# Patient Record
Sex: Female | Born: 1950 | Race: Black or African American | Hispanic: No | Marital: Married | Smoking: Current every day smoker
Health system: Southern US, Community
[De-identification: ages and names within clinical notes are randomized; demographics above are authoritative.]

## PROBLEM LIST (undated history)

## (undated) DIAGNOSIS — F329 Major depressive disorder, single episode, unspecified: Secondary | ICD-10-CM

## (undated) DIAGNOSIS — I10 Essential (primary) hypertension: Secondary | ICD-10-CM

## (undated) DIAGNOSIS — T7840XA Allergy, unspecified, initial encounter: Secondary | ICD-10-CM

## (undated) DIAGNOSIS — G8929 Other chronic pain: Secondary | ICD-10-CM

## (undated) DIAGNOSIS — Z923 Personal history of irradiation: Secondary | ICD-10-CM

## (undated) DIAGNOSIS — K2970 Gastritis, unspecified, without bleeding: Secondary | ICD-10-CM

## (undated) DIAGNOSIS — G4730 Sleep apnea, unspecified: Secondary | ICD-10-CM

## (undated) DIAGNOSIS — B1910 Unspecified viral hepatitis B without hepatic coma: Secondary | ICD-10-CM

## (undated) DIAGNOSIS — B9681 Helicobacter pylori [H. pylori] as the cause of diseases classified elsewhere: Secondary | ICD-10-CM

## (undated) DIAGNOSIS — M545 Low back pain, unspecified: Secondary | ICD-10-CM

## (undated) DIAGNOSIS — R51 Headache: Secondary | ICD-10-CM

## (undated) DIAGNOSIS — K5730 Diverticulosis of large intestine without perforation or abscess without bleeding: Secondary | ICD-10-CM

## (undated) DIAGNOSIS — K644 Residual hemorrhoidal skin tags: Secondary | ICD-10-CM

## (undated) DIAGNOSIS — R509 Fever, unspecified: Principal | ICD-10-CM

## (undated) DIAGNOSIS — R05 Cough: Secondary | ICD-10-CM

## (undated) DIAGNOSIS — F419 Anxiety disorder, unspecified: Secondary | ICD-10-CM

## (undated) DIAGNOSIS — R300 Dysuria: Secondary | ICD-10-CM

## (undated) DIAGNOSIS — R5382 Chronic fatigue, unspecified: Secondary | ICD-10-CM

## (undated) DIAGNOSIS — Z86010 Personal history of colonic polyps: Secondary | ICD-10-CM

## (undated) DIAGNOSIS — F32A Depression, unspecified: Secondary | ICD-10-CM

## (undated) DIAGNOSIS — K589 Irritable bowel syndrome without diarrhea: Secondary | ICD-10-CM

## (undated) DIAGNOSIS — R252 Cramp and spasm: Principal | ICD-10-CM

## (undated) DIAGNOSIS — E860 Dehydration: Secondary | ICD-10-CM

## (undated) DIAGNOSIS — M1990 Unspecified osteoarthritis, unspecified site: Secondary | ICD-10-CM

## (undated) DIAGNOSIS — K219 Gastro-esophageal reflux disease without esophagitis: Secondary | ICD-10-CM

## (undated) DIAGNOSIS — K122 Cellulitis and abscess of mouth: Principal | ICD-10-CM

## (undated) DIAGNOSIS — B1920 Unspecified viral hepatitis C without hepatic coma: Secondary | ICD-10-CM

## (undated) DIAGNOSIS — E559 Vitamin D deficiency, unspecified: Secondary | ICD-10-CM

## (undated) DIAGNOSIS — R110 Nausea: Secondary | ICD-10-CM

## (undated) DIAGNOSIS — E785 Hyperlipidemia, unspecified: Secondary | ICD-10-CM

## (undated) DIAGNOSIS — M5430 Sciatica, unspecified side: Secondary | ICD-10-CM

## (undated) DIAGNOSIS — K222 Esophageal obstruction: Secondary | ICD-10-CM

## (undated) HISTORY — PX: APPENDECTOMY: SHX54

## (undated) HISTORY — PX: TONSILLECTOMY AND ADENOIDECTOMY: SUR1326

## (undated) HISTORY — DX: Anxiety disorder, unspecified: F41.9

## (undated) HISTORY — DX: Nausea: R11.0

## (undated) HISTORY — DX: Major depressive disorder, single episode, unspecified: F32.9

## (undated) HISTORY — DX: Essential (primary) hypertension: I10

## (undated) HISTORY — PX: OOPHORECTOMY: SHX86

## (undated) HISTORY — DX: Chronic fatigue, unspecified: R53.82

## (undated) HISTORY — DX: Diverticulosis of large intestine without perforation or abscess without bleeding: K57.30

## (undated) HISTORY — DX: Sciatica, unspecified side: M54.30

## (undated) HISTORY — DX: Sleep apnea, unspecified: G47.30

## (undated) HISTORY — DX: Dysuria: R30.0

## (undated) HISTORY — DX: Other chronic pain: G89.29

## (undated) HISTORY — DX: Cramp and spasm: R25.2

## (undated) HISTORY — DX: Headache: R51

## (undated) HISTORY — DX: Dehydration: E86.0

## (undated) HISTORY — PX: BONE MARROW TRANSPLANT: SHX200

## (undated) HISTORY — DX: Cellulitis and abscess of mouth: K12.2

## (undated) HISTORY — DX: Low back pain, unspecified: M54.50

## (undated) HISTORY — PX: DILATION AND CURETTAGE OF UTERUS: SHX78

## (undated) HISTORY — DX: Gastritis, unspecified, without bleeding: K29.70

## (undated) HISTORY — DX: Irritable bowel syndrome, unspecified: K58.9

## (undated) HISTORY — DX: Residual hemorrhoidal skin tags: K64.4

## (undated) HISTORY — DX: Helicobacter pylori (H. pylori) as the cause of diseases classified elsewhere: B96.81

## (undated) HISTORY — DX: Unspecified osteoarthritis, unspecified site: M19.90

## (undated) HISTORY — DX: Esophageal obstruction: K22.2

## (undated) HISTORY — DX: Vitamin D deficiency, unspecified: E55.9

## (undated) HISTORY — DX: Depression, unspecified: F32.A

## (undated) HISTORY — PX: ABDOMINAL HYSTERECTOMY: SHX81

## (undated) HISTORY — DX: Unspecified viral hepatitis B without hepatic coma: B19.10

## (undated) HISTORY — DX: Cough: R05

## (undated) HISTORY — PX: CHOLECYSTECTOMY: SHX55

## (undated) HISTORY — DX: Low back pain: M54.5

## (undated) HISTORY — DX: Personal history of colonic polyps: Z86.010

## (undated) HISTORY — DX: Unspecified viral hepatitis C without hepatic coma: B19.20

## (undated) HISTORY — DX: Gastro-esophageal reflux disease without esophagitis: K21.9

## (undated) HISTORY — DX: Fever, unspecified: R50.9

## (undated) HISTORY — DX: Hyperlipidemia, unspecified: E78.5

---

## 1999-11-03 DIAGNOSIS — B9681 Helicobacter pylori [H. pylori] as the cause of diseases classified elsewhere: Secondary | ICD-10-CM

## 1999-11-03 HISTORY — DX: Helicobacter pylori (H. pylori) as the cause of diseases classified elsewhere: B96.81

## 1999-12-25 ENCOUNTER — Other Ambulatory Visit: Admission: RE | Admit: 1999-12-25 | Discharge: 1999-12-25 | Payer: Self-pay | Admitting: Gastroenterology

## 1999-12-25 ENCOUNTER — Encounter (INDEPENDENT_AMBULATORY_CARE_PROVIDER_SITE_OTHER): Payer: Self-pay | Admitting: Specialist

## 2000-03-06 ENCOUNTER — Emergency Department (HOSPITAL_COMMUNITY): Admission: EM | Admit: 2000-03-06 | Discharge: 2000-03-06 | Payer: Self-pay | Admitting: Internal Medicine

## 2000-12-01 ENCOUNTER — Encounter: Payer: Self-pay | Admitting: Gynecology

## 2000-12-01 ENCOUNTER — Encounter: Admission: RE | Admit: 2000-12-01 | Discharge: 2000-12-01 | Payer: Self-pay | Admitting: Gynecology

## 2002-02-20 ENCOUNTER — Other Ambulatory Visit: Admission: RE | Admit: 2002-02-20 | Discharge: 2002-02-20 | Payer: Self-pay | Admitting: Gynecology

## 2002-04-05 ENCOUNTER — Ambulatory Visit (HOSPITAL_COMMUNITY): Admission: RE | Admit: 2002-04-05 | Discharge: 2002-04-05 | Payer: Self-pay | Admitting: Gastroenterology

## 2002-04-05 ENCOUNTER — Encounter: Payer: Self-pay | Admitting: Gastroenterology

## 2002-05-27 ENCOUNTER — Encounter: Payer: Self-pay | Admitting: Emergency Medicine

## 2002-05-27 ENCOUNTER — Emergency Department (HOSPITAL_COMMUNITY): Admission: EM | Admit: 2002-05-27 | Discharge: 2002-05-27 | Payer: Self-pay | Admitting: Emergency Medicine

## 2003-01-12 ENCOUNTER — Encounter: Payer: Self-pay | Admitting: Emergency Medicine

## 2003-01-12 ENCOUNTER — Emergency Department (HOSPITAL_COMMUNITY): Admission: EM | Admit: 2003-01-12 | Discharge: 2003-01-12 | Payer: Self-pay | Admitting: Emergency Medicine

## 2003-01-15 ENCOUNTER — Inpatient Hospital Stay (HOSPITAL_COMMUNITY): Admission: EM | Admit: 2003-01-15 | Discharge: 2003-01-16 | Payer: Self-pay | Admitting: Emergency Medicine

## 2003-01-15 ENCOUNTER — Encounter: Payer: Self-pay | Admitting: Emergency Medicine

## 2003-01-16 ENCOUNTER — Encounter: Payer: Self-pay | Admitting: Cardiology

## 2003-01-22 ENCOUNTER — Encounter: Payer: Self-pay | Admitting: Cardiology

## 2003-01-22 ENCOUNTER — Encounter: Admission: RE | Admit: 2003-01-22 | Discharge: 2003-01-22 | Payer: Self-pay | Admitting: Cardiology

## 2003-02-19 ENCOUNTER — Other Ambulatory Visit: Admission: RE | Admit: 2003-02-19 | Discharge: 2003-02-19 | Payer: Self-pay | Admitting: Gynecology

## 2003-08-03 HISTORY — PX: UPPER GASTROINTESTINAL ENDOSCOPY: SHX188

## 2003-08-15 ENCOUNTER — Encounter: Payer: Self-pay | Admitting: Emergency Medicine

## 2003-08-15 ENCOUNTER — Observation Stay (HOSPITAL_COMMUNITY): Admission: EM | Admit: 2003-08-15 | Discharge: 2003-08-16 | Payer: Self-pay | Admitting: Emergency Medicine

## 2003-08-16 ENCOUNTER — Encounter: Payer: Self-pay | Admitting: Internal Medicine

## 2003-09-12 ENCOUNTER — Ambulatory Visit (HOSPITAL_COMMUNITY): Admission: RE | Admit: 2003-09-12 | Discharge: 2003-09-12 | Payer: Self-pay | Admitting: Gastroenterology

## 2003-12-15 ENCOUNTER — Emergency Department (HOSPITAL_COMMUNITY): Admission: EM | Admit: 2003-12-15 | Discharge: 2003-12-15 | Payer: Self-pay | Admitting: Emergency Medicine

## 2004-02-22 ENCOUNTER — Encounter (INDEPENDENT_AMBULATORY_CARE_PROVIDER_SITE_OTHER): Payer: Self-pay | Admitting: Specialist

## 2004-02-22 ENCOUNTER — Ambulatory Visit (HOSPITAL_COMMUNITY): Admission: RE | Admit: 2004-02-22 | Discharge: 2004-02-22 | Payer: Self-pay | Admitting: Internal Medicine

## 2004-09-19 ENCOUNTER — Ambulatory Visit: Payer: Self-pay | Admitting: Internal Medicine

## 2004-10-29 ENCOUNTER — Emergency Department (HOSPITAL_COMMUNITY): Admission: EM | Admit: 2004-10-29 | Discharge: 2004-10-29 | Payer: Self-pay | Admitting: Emergency Medicine

## 2005-08-07 ENCOUNTER — Ambulatory Visit: Payer: Self-pay | Admitting: Internal Medicine

## 2005-08-14 ENCOUNTER — Other Ambulatory Visit: Admission: RE | Admit: 2005-08-14 | Discharge: 2005-08-14 | Payer: Self-pay | Admitting: Internal Medicine

## 2005-08-14 ENCOUNTER — Encounter: Payer: Self-pay | Admitting: Internal Medicine

## 2005-08-14 ENCOUNTER — Ambulatory Visit: Payer: Self-pay | Admitting: Internal Medicine

## 2005-08-21 ENCOUNTER — Ambulatory Visit: Payer: Self-pay | Admitting: Internal Medicine

## 2005-09-18 ENCOUNTER — Encounter: Admission: RE | Admit: 2005-09-18 | Discharge: 2005-09-18 | Payer: Self-pay | Admitting: Internal Medicine

## 2005-10-09 ENCOUNTER — Ambulatory Visit: Payer: Self-pay | Admitting: Internal Medicine

## 2005-10-09 ENCOUNTER — Ambulatory Visit (HOSPITAL_BASED_OUTPATIENT_CLINIC_OR_DEPARTMENT_OTHER): Admission: RE | Admit: 2005-10-09 | Discharge: 2005-10-09 | Payer: Self-pay | Admitting: Internal Medicine

## 2005-10-19 ENCOUNTER — Ambulatory Visit: Payer: Self-pay | Admitting: Pulmonary Disease

## 2005-11-02 DIAGNOSIS — Z86010 Personal history of colonic polyps: Secondary | ICD-10-CM

## 2005-11-02 DIAGNOSIS — Z860101 Personal history of adenomatous and serrated colon polyps: Secondary | ICD-10-CM

## 2005-11-02 HISTORY — DX: Personal history of colonic polyps: Z86.010

## 2005-11-02 HISTORY — DX: Personal history of adenomatous and serrated colon polyps: Z86.0101

## 2005-11-13 ENCOUNTER — Ambulatory Visit: Payer: Self-pay | Admitting: Internal Medicine

## 2006-01-24 ENCOUNTER — Emergency Department (HOSPITAL_COMMUNITY): Admission: EM | Admit: 2006-01-24 | Discharge: 2006-01-24 | Payer: Self-pay | Admitting: Emergency Medicine

## 2006-02-17 ENCOUNTER — Ambulatory Visit: Payer: Self-pay | Admitting: Internal Medicine

## 2006-02-17 ENCOUNTER — Ambulatory Visit: Payer: Self-pay | Admitting: *Deleted

## 2006-02-25 ENCOUNTER — Ambulatory Visit: Payer: Self-pay | Admitting: Internal Medicine

## 2006-06-07 ENCOUNTER — Ambulatory Visit: Payer: Self-pay | Admitting: Internal Medicine

## 2006-06-08 ENCOUNTER — Encounter: Admission: RE | Admit: 2006-06-08 | Discharge: 2006-06-08 | Payer: Self-pay | Admitting: Internal Medicine

## 2006-06-10 ENCOUNTER — Ambulatory Visit: Payer: Self-pay | Admitting: Internal Medicine

## 2006-06-15 ENCOUNTER — Encounter (INDEPENDENT_AMBULATORY_CARE_PROVIDER_SITE_OTHER): Payer: Self-pay | Admitting: *Deleted

## 2006-06-15 ENCOUNTER — Ambulatory Visit (HOSPITAL_COMMUNITY): Admission: RE | Admit: 2006-06-15 | Discharge: 2006-06-15 | Payer: Self-pay | Admitting: Nephrology

## 2006-06-23 ENCOUNTER — Ambulatory Visit: Payer: Self-pay | Admitting: Internal Medicine

## 2006-06-24 ENCOUNTER — Ambulatory Visit: Payer: Self-pay | Admitting: Cardiology

## 2006-07-02 ENCOUNTER — Ambulatory Visit: Payer: Self-pay | Admitting: Internal Medicine

## 2006-07-07 ENCOUNTER — Ambulatory Visit (HOSPITAL_COMMUNITY): Admission: RE | Admit: 2006-07-07 | Discharge: 2006-07-07 | Payer: Self-pay | Admitting: Internal Medicine

## 2006-07-16 ENCOUNTER — Ambulatory Visit (HOSPITAL_COMMUNITY): Admission: RE | Admit: 2006-07-16 | Discharge: 2006-07-16 | Payer: Self-pay | Admitting: Internal Medicine

## 2006-07-16 ENCOUNTER — Encounter (INDEPENDENT_AMBULATORY_CARE_PROVIDER_SITE_OTHER): Payer: Self-pay | Admitting: *Deleted

## 2006-07-23 ENCOUNTER — Ambulatory Visit: Payer: Self-pay | Admitting: Internal Medicine

## 2006-08-02 HISTORY — PX: COLONOSCOPY W/ POLYPECTOMY: SHX1380

## 2006-08-06 ENCOUNTER — Ambulatory Visit: Payer: Self-pay | Admitting: Internal Medicine

## 2006-08-06 ENCOUNTER — Encounter (INDEPENDENT_AMBULATORY_CARE_PROVIDER_SITE_OTHER): Payer: Self-pay | Admitting: *Deleted

## 2006-08-31 ENCOUNTER — Ambulatory Visit: Payer: Self-pay | Admitting: Internal Medicine

## 2006-08-31 DIAGNOSIS — F329 Major depressive disorder, single episode, unspecified: Secondary | ICD-10-CM

## 2006-08-31 DIAGNOSIS — F17200 Nicotine dependence, unspecified, uncomplicated: Secondary | ICD-10-CM | POA: Insufficient documentation

## 2006-08-31 DIAGNOSIS — K5730 Diverticulosis of large intestine without perforation or abscess without bleeding: Secondary | ICD-10-CM | POA: Insufficient documentation

## 2006-08-31 DIAGNOSIS — Z860100 Personal history of colon polyps, unspecified: Secondary | ICD-10-CM | POA: Insufficient documentation

## 2006-08-31 DIAGNOSIS — F3289 Other specified depressive episodes: Secondary | ICD-10-CM

## 2006-08-31 DIAGNOSIS — B182 Chronic viral hepatitis C: Secondary | ICD-10-CM

## 2006-08-31 HISTORY — DX: Major depressive disorder, single episode, unspecified: F32.9

## 2006-08-31 HISTORY — DX: Diverticulosis of large intestine without perforation or abscess without bleeding: K57.30

## 2006-08-31 HISTORY — DX: Other specified depressive episodes: F32.89

## 2006-08-31 LAB — CONVERTED CEMR LAB
ALT: 58 units/L — ABNORMAL HIGH (ref 0–40)
AST: 55 units/L — ABNORMAL HIGH (ref 0–37)
Albumin: 3.8 g/dL (ref 3.5–5.2)
Alkaline Phosphatase: 83 units/L (ref 39–117)
BUN: 8 mg/dL (ref 6–23)
Basophils Absolute: 0 10*3/uL (ref 0.0–0.1)
Basophils Relative: 0.5 % (ref 0.0–1.0)
Bilirubin, Direct: 0.1 mg/dL (ref 0.0–0.3)
CO2: 29 meq/L (ref 19–32)
Calcium: 10.4 mg/dL (ref 8.4–10.5)
Chloride: 105 meq/L (ref 96–112)
Creatinine, Ser: 0.9 mg/dL (ref 0.4–1.2)
Eosinophil percent: 1.6 % (ref 0.0–5.0)
GFR calc non Af Amer: 69 mL/min
Glomerular Filtration Rate, Af Am: 83 mL/min/{1.73_m2}
Glucose, Bld: 98 mg/dL (ref 70–99)
HCT: 43.8 % (ref 36.0–46.0)
Hemoglobin: 14.5 g/dL (ref 12.0–15.0)
Lymphocytes Relative: 33.2 % (ref 12.0–46.0)
MCHC: 33.1 g/dL (ref 30.0–36.0)
MCV: 93.4 fL (ref 78.0–100.0)
Monocytes Absolute: 0.5 10*3/uL (ref 0.2–0.7)
Monocytes Relative: 5.8 % (ref 3.0–11.0)
Neutro Abs: 5.2 10*3/uL (ref 1.4–7.7)
Neutrophils Relative %: 58.9 % (ref 43.0–77.0)
Platelets: 314 10*3/uL (ref 150–400)
Potassium: 5.1 meq/L (ref 3.5–5.1)
RBC: 4.69 M/uL (ref 3.87–5.11)
RDW: 12.7 % (ref 11.5–14.6)
Sodium: 139 meq/L (ref 135–145)
Total Bilirubin: 0.7 mg/dL (ref 0.3–1.2)
Total Protein: 7.6 g/dL (ref 6.0–8.3)
WBC: 8.7 10*3/uL (ref 4.5–10.5)

## 2006-09-02 ENCOUNTER — Encounter: Admission: RE | Admit: 2006-09-02 | Discharge: 2006-09-02 | Payer: Self-pay | Admitting: Internal Medicine

## 2006-09-14 ENCOUNTER — Ambulatory Visit: Payer: Self-pay | Admitting: Internal Medicine

## 2006-09-15 ENCOUNTER — Encounter: Payer: Self-pay | Admitting: Internal Medicine

## 2006-09-21 ENCOUNTER — Encounter (INDEPENDENT_AMBULATORY_CARE_PROVIDER_SITE_OTHER): Payer: Self-pay | Admitting: *Deleted

## 2006-09-21 ENCOUNTER — Ambulatory Visit (HOSPITAL_COMMUNITY): Admission: RE | Admit: 2006-09-21 | Discharge: 2006-09-21 | Payer: Self-pay | Admitting: Internal Medicine

## 2006-09-28 ENCOUNTER — Ambulatory Visit: Payer: Self-pay | Admitting: Internal Medicine

## 2006-09-28 DIAGNOSIS — C9030 Solitary plasmacytoma not having achieved remission: Secondary | ICD-10-CM | POA: Insufficient documentation

## 2006-10-03 ENCOUNTER — Ambulatory Visit: Payer: Self-pay | Admitting: Oncology

## 2006-10-15 LAB — CBC WITH DIFFERENTIAL/PLATELET
Basophils Absolute: 0.1 10*3/uL (ref 0.0–0.1)
EOS%: 1.6 % (ref 0.0–7.0)
HGB: 15.6 g/dL (ref 11.6–15.9)
MCH: 30.7 pg (ref 26.0–34.0)
MCHC: 34.9 g/dL (ref 32.0–36.0)
MCV: 87.8 fL (ref 81.0–101.0)
MONO%: 2.9 % (ref 0.0–13.0)
RBC: 5.08 10*6/uL (ref 3.70–5.32)
RDW: 11.6 % (ref 11.3–14.5)

## 2006-10-18 ENCOUNTER — Ambulatory Visit (HOSPITAL_COMMUNITY): Admission: RE | Admit: 2006-10-18 | Discharge: 2006-10-18 | Payer: Self-pay | Admitting: Oncology

## 2006-10-19 LAB — VISCOSITY, SERUM: Viscosity, Serum: 1.3 mPa.S (ref 1.1–2.0)

## 2006-10-19 LAB — KAPPA/LAMBDA LIGHT CHAINS
Kappa:Lambda Ratio: 33.11 — ABNORMAL HIGH (ref 0.26–1.65)
Lambda Free Lght Chn: 1.03 mg/dL (ref 0.57–2.63)

## 2006-10-19 LAB — COMPREHENSIVE METABOLIC PANEL
Alkaline Phosphatase: 103 U/L (ref 39–117)
Glucose, Bld: 123 mg/dL — ABNORMAL HIGH (ref 70–99)
Sodium: 138 mEq/L (ref 135–145)
Total Bilirubin: 0.5 mg/dL (ref 0.3–1.2)
Total Protein: 8.4 g/dL — ABNORMAL HIGH (ref 6.0–8.3)

## 2006-10-19 LAB — SPEP & IFE WITH QIG
Albumin ELP: 52.6 % — ABNORMAL LOW (ref 55.8–66.1)
Alpha-1-Globulin: 4.3 % (ref 2.9–4.9)
IgM, Serum: 171 mg/dL (ref 60–263)

## 2006-10-19 LAB — IRON AND TIBC: TIBC: 308 ug/dL (ref 250–470)

## 2006-10-20 ENCOUNTER — Ambulatory Visit: Admission: RE | Admit: 2006-10-20 | Discharge: 2007-01-12 | Payer: Self-pay | Admitting: Radiation Oncology

## 2006-10-22 LAB — UIFE/LIGHT CHAINS/TP QN, 24-HR UR: Gamma Globulin, Urine: DETECTED — AB

## 2006-10-25 ENCOUNTER — Ambulatory Visit: Payer: Self-pay | Admitting: Oncology

## 2006-10-25 ENCOUNTER — Ambulatory Visit (HOSPITAL_COMMUNITY): Admission: RE | Admit: 2006-10-25 | Discharge: 2006-10-25 | Payer: Self-pay | Admitting: Internal Medicine

## 2006-10-25 ENCOUNTER — Encounter: Payer: Self-pay | Admitting: Oncology

## 2006-11-17 ENCOUNTER — Ambulatory Visit (HOSPITAL_COMMUNITY): Admission: RE | Admit: 2006-11-17 | Discharge: 2006-11-17 | Payer: Self-pay | Admitting: Neurosurgery

## 2006-11-17 ENCOUNTER — Encounter (INDEPENDENT_AMBULATORY_CARE_PROVIDER_SITE_OTHER): Payer: Self-pay | Admitting: Specialist

## 2006-11-18 ENCOUNTER — Ambulatory Visit: Payer: Self-pay | Admitting: Oncology

## 2006-11-23 LAB — COMPREHENSIVE METABOLIC PANEL
ALT: 44 U/L — ABNORMAL HIGH (ref 0–35)
Albumin: 4 g/dL (ref 3.5–5.2)
CO2: 23 mEq/L (ref 19–32)
Glucose, Bld: 109 mg/dL — ABNORMAL HIGH (ref 70–99)
Potassium: 3.9 mEq/L (ref 3.5–5.3)
Sodium: 140 mEq/L (ref 135–145)
Total Bilirubin: 0.4 mg/dL (ref 0.3–1.2)
Total Protein: 7.8 g/dL (ref 6.0–8.3)

## 2006-11-23 LAB — CBC WITH DIFFERENTIAL/PLATELET
BASO%: 0.5 % (ref 0.0–2.0)
Eosinophils Absolute: 0.1 10*3/uL (ref 0.0–0.5)
LYMPH%: 36.9 % (ref 14.0–48.0)
MCHC: 34.2 g/dL (ref 32.0–36.0)
MONO#: 0.5 10*3/uL (ref 0.1–0.9)
NEUT#: 4.9 10*3/uL (ref 1.5–6.5)
Platelets: 333 10*3/uL (ref 145–400)
RBC: 4.5 10*6/uL (ref 3.70–5.32)
RDW: 13.8 % (ref 11.3–14.5)
WBC: 8.8 10*3/uL (ref 3.9–10.0)
lymph#: 3.3 10*3/uL (ref 0.9–3.3)

## 2006-11-29 ENCOUNTER — Ambulatory Visit: Payer: Self-pay | Admitting: Internal Medicine

## 2006-12-02 ENCOUNTER — Encounter: Admission: RE | Admit: 2006-12-02 | Discharge: 2006-12-02 | Payer: Self-pay | Admitting: Internal Medicine

## 2006-12-07 LAB — CBC WITH DIFFERENTIAL/PLATELET
BASO%: 0.4 % (ref 0.0–2.0)
LYMPH%: 23.8 % (ref 14.0–48.0)
MCHC: 35 g/dL (ref 32.0–36.0)
MCV: 88.2 fL (ref 81.0–101.0)
MONO#: 0.6 10*3/uL (ref 0.1–0.9)
MONO%: 6.2 % (ref 0.0–13.0)
Platelets: 316 10*3/uL (ref 145–400)
RBC: 4.51 10*6/uL (ref 3.70–5.32)
RDW: 13.6 % (ref 11.3–14.5)
WBC: 9.6 10*3/uL (ref 3.9–10.0)

## 2006-12-09 ENCOUNTER — Ambulatory Visit (HOSPITAL_COMMUNITY): Admission: RE | Admit: 2006-12-09 | Discharge: 2006-12-09 | Payer: Self-pay | Admitting: Radiation Oncology

## 2006-12-13 ENCOUNTER — Emergency Department (HOSPITAL_COMMUNITY): Admission: EM | Admit: 2006-12-13 | Discharge: 2006-12-14 | Payer: Self-pay | Admitting: Emergency Medicine

## 2006-12-14 ENCOUNTER — Ambulatory Visit: Payer: Self-pay | Admitting: Internal Medicine

## 2006-12-30 ENCOUNTER — Ambulatory Visit: Payer: Self-pay | Admitting: Internal Medicine

## 2006-12-30 LAB — CONVERTED CEMR LAB
ALT: 59 units/L — ABNORMAL HIGH (ref 0–40)
AST: 57 units/L — ABNORMAL HIGH (ref 0–37)
Albumin: 3.8 g/dL (ref 3.5–5.2)
Alkaline Phosphatase: 107 units/L (ref 39–117)
BUN: 10 mg/dL (ref 6–23)
Basophils Absolute: 0 10*3/uL (ref 0.0–0.1)
Basophils Relative: 0.7 % (ref 0.0–1.0)
Bilirubin, Direct: 0.1 mg/dL (ref 0.0–0.3)
CO2: 23 meq/L (ref 19–32)
CRP, High Sensitivity: 5 (ref 0.00–5.00)
Calcium: 10.1 mg/dL (ref 8.4–10.5)
Chloride: 102 meq/L (ref 96–112)
Creatinine, Ser: 0.7 mg/dL (ref 0.4–1.2)
Eosinophils Absolute: 0.2 10*3/uL (ref 0.0–0.6)
Eosinophils Relative: 2.6 % (ref 0.0–5.0)
GFR calc Af Amer: 111 mL/min
GFR calc non Af Amer: 92 mL/min
Glucose, Bld: 96 mg/dL (ref 70–99)
HCT: 39.4 % (ref 36.0–46.0)
Hemoglobin: 13.6 g/dL (ref 12.0–15.0)
Lymphocytes Relative: 18.3 % (ref 12.0–46.0)
MCHC: 34.5 g/dL (ref 30.0–36.0)
MCV: 89.1 fL (ref 78.0–100.0)
Monocytes Absolute: 0.6 10*3/uL (ref 0.2–0.7)
Monocytes Relative: 8.8 % (ref 3.0–11.0)
Neutro Abs: 4.8 10*3/uL (ref 1.4–7.7)
Neutrophils Relative %: 69.6 % (ref 43.0–77.0)
Platelets: 342 10*3/uL (ref 150–400)
Potassium: 4 meq/L (ref 3.5–5.1)
RBC: 4.42 M/uL (ref 3.87–5.11)
RDW: 13.5 % (ref 11.5–14.6)
Sodium: 134 meq/L — ABNORMAL LOW (ref 135–145)
Total Bilirubin: 0.5 mg/dL (ref 0.3–1.2)
Total Protein: 7.8 g/dL (ref 6.0–8.3)
WBC: 6.9 10*3/uL (ref 4.5–10.5)

## 2007-01-07 ENCOUNTER — Ambulatory Visit: Payer: Self-pay | Admitting: Oncology

## 2007-01-19 ENCOUNTER — Ambulatory Visit: Payer: Self-pay | Admitting: Internal Medicine

## 2007-02-08 ENCOUNTER — Ambulatory Visit (HOSPITAL_COMMUNITY): Admission: RE | Admit: 2007-02-08 | Discharge: 2007-02-08 | Payer: Self-pay | Admitting: Anesthesiology

## 2007-02-11 LAB — CBC WITH DIFFERENTIAL/PLATELET
BASO%: 0.3 % (ref 0.0–2.0)
Eosinophils Absolute: 0.1 10*3/uL (ref 0.0–0.5)
MCHC: 35 g/dL (ref 32.0–36.0)
MONO#: 0.5 10*3/uL (ref 0.1–0.9)
NEUT#: 6.4 10*3/uL (ref 1.5–6.5)
RBC: 4.32 10*6/uL (ref 3.70–5.32)
WBC: 8.4 10*3/uL (ref 3.9–10.0)
lymph#: 1.4 10*3/uL (ref 0.9–3.3)

## 2007-02-16 LAB — COMPREHENSIVE METABOLIC PANEL
ALT: 20 U/L (ref 0–35)
Albumin: 4.2 g/dL (ref 3.5–5.2)
CO2: 24 mEq/L (ref 19–32)
Calcium: 9.6 mg/dL (ref 8.4–10.5)
Chloride: 105 mEq/L (ref 96–112)
Potassium: 3.6 mEq/L (ref 3.5–5.3)
Sodium: 138 mEq/L (ref 135–145)
Total Bilirubin: 0.3 mg/dL (ref 0.3–1.2)
Total Protein: 7.6 g/dL (ref 6.0–8.3)

## 2007-02-16 LAB — UIFE/LIGHT CHAINS/TP QN, 24-HR UR
Free Kappa/Lambda Ratio: 4468.75 ratio — ABNORMAL HIGH (ref 0.46–4.00)
Free Lambda Excretion/Day: 1.56 mg/d
Free Lambda Lt Chains,Ur: 0.16 mg/dL (ref 0.08–1.01)
Time: 24 hours
Total Protein, Urine-Ur/day: 6976 mg/d — ABNORMAL HIGH (ref 10–140)
Volume, Urine: 975 mL

## 2007-02-16 LAB — SPEP & IFE WITH QIG
Alpha-1-Globulin: 5.1 % — ABNORMAL HIGH (ref 2.9–4.9)
Alpha-2-Globulin: 13.5 % — ABNORMAL HIGH (ref 7.1–11.8)
Beta 2: 3.5 % (ref 3.2–6.5)
Gamma Globulin: 18.8 % (ref 11.1–18.8)
IgG (Immunoglobin G), Serum: 1600 mg/dL (ref 694–1618)
IgM, Serum: 106 mg/dL (ref 60–263)

## 2007-02-16 LAB — LACTATE DEHYDROGENASE: LDH: 160 U/L (ref 94–250)

## 2007-02-22 ENCOUNTER — Encounter: Admission: RE | Admit: 2007-02-22 | Discharge: 2007-02-22 | Payer: Self-pay | Admitting: Anesthesiology

## 2007-03-03 ENCOUNTER — Ambulatory Visit: Payer: Self-pay | Admitting: Internal Medicine

## 2007-03-08 ENCOUNTER — Ambulatory Visit: Payer: Self-pay | Admitting: Cardiology

## 2007-03-09 ENCOUNTER — Ambulatory Visit: Payer: Self-pay | Admitting: Oncology

## 2007-03-25 LAB — CBC WITH DIFFERENTIAL/PLATELET
BASO%: 0.5 % (ref 0.0–2.0)
LYMPH%: 7.7 % — ABNORMAL LOW (ref 14.0–48.0)
MCHC: 35 g/dL (ref 32.0–36.0)
MONO#: 0.2 10*3/uL (ref 0.1–0.9)
RBC: 4.51 10*6/uL (ref 3.70–5.32)
WBC: 12.9 10*3/uL — ABNORMAL HIGH (ref 3.9–10.0)
lymph#: 1 10*3/uL (ref 0.9–3.3)

## 2007-03-31 LAB — SPEP & IFE WITH QIG
Albumin ELP: 53.1 % — ABNORMAL LOW (ref 55.8–66.1)
Alpha-1-Globulin: 4.8 % (ref 2.9–4.9)
Beta 2: 3.1 % — ABNORMAL LOW (ref 3.2–6.5)
IgA: 81 mg/dL (ref 68–378)
IgM, Serum: 105 mg/dL (ref 60–263)
Total Protein, Serum Electrophoresis: 8.5 g/dL — ABNORMAL HIGH (ref 6.0–8.3)

## 2007-03-31 LAB — COMPREHENSIVE METABOLIC PANEL
ALT: 27 U/L (ref 0–35)
CO2: 21 mEq/L (ref 19–32)
Chloride: 103 mEq/L (ref 96–112)
Sodium: 137 mEq/L (ref 135–145)
Total Bilirubin: 0.7 mg/dL (ref 0.3–1.2)
Total Protein: 8.5 g/dL — ABNORMAL HIGH (ref 6.0–8.3)

## 2007-03-31 LAB — KAPPA/LAMBDA LIGHT CHAINS: Lambda Free Lght Chn: 1.35 mg/dL (ref 0.57–2.63)

## 2007-04-01 LAB — BASIC METABOLIC PANEL
CO2: 22 mEq/L (ref 19–32)
Chloride: 102 mEq/L (ref 96–112)
Creatinine, Ser: 0.67 mg/dL (ref 0.40–1.20)
Potassium: 4 mEq/L (ref 3.5–5.3)
Sodium: 137 mEq/L (ref 135–145)

## 2007-04-22 ENCOUNTER — Ambulatory Visit: Payer: Self-pay | Admitting: Oncology

## 2007-04-27 LAB — CBC WITH DIFFERENTIAL/PLATELET
BASO%: 1 % (ref 0.0–2.0)
Basophils Absolute: 0 10*3/uL (ref 0.0–0.1)
EOS%: 9.2 % — ABNORMAL HIGH (ref 0.0–7.0)
Eosinophils Absolute: 0.4 10*3/uL (ref 0.0–0.5)
HCT: 32.4 % — ABNORMAL LOW (ref 34.8–46.6)
HGB: 11.4 g/dL — ABNORMAL LOW (ref 11.6–15.9)
LYMPH%: 22.6 % (ref 14.0–48.0)
MCH: 31.4 pg (ref 26.0–34.0)
MCHC: 35.1 g/dL (ref 32.0–36.0)
MCV: 89.6 fL (ref 81.0–101.0)
MONO#: 0.3 10*3/uL (ref 0.1–0.9)
MONO%: 6.5 % (ref 0.0–13.0)
NEUT#: 2.8 10*3/uL (ref 1.5–6.5)
NEUT%: 60.7 % (ref 39.6–76.8)
Platelets: 290 10*3/uL (ref 145–400)
RBC: 3.62 10*6/uL — ABNORMAL LOW (ref 3.70–5.32)
RDW: 13.9 % (ref 11.3–14.5)
WBC: 4.5 10*3/uL (ref 3.9–10.0)
lymph#: 1 10*3/uL (ref 0.9–3.3)

## 2007-04-29 LAB — SPEP & IFE WITH QIG
Albumin ELP: 53.6 % — ABNORMAL LOW (ref 55.8–66.1)
Alpha-1-Globulin: 5.9 % — ABNORMAL HIGH (ref 2.9–4.9)
Alpha-2-Globulin: 14.5 % — ABNORMAL HIGH (ref 7.1–11.8)
Beta 2: 3.8 % (ref 3.2–6.5)
Beta Globulin: 5.3 % (ref 4.7–7.2)
Gamma Globulin: 16.9 % (ref 11.1–18.8)
IgA: 57 mg/dL — ABNORMAL LOW (ref 68–378)
IgG (Immunoglobin G), Serum: 1230 mg/dL (ref 694–1618)
IgM, Serum: 72 mg/dL (ref 60–263)
Total Protein, Serum Electrophoresis: 6.9 g/dL (ref 6.0–8.3)

## 2007-04-29 LAB — COMPREHENSIVE METABOLIC PANEL
ALT: 21 U/L (ref 0–35)
AST: 21 U/L (ref 0–37)
Albumin: 4.1 g/dL (ref 3.5–5.2)
Alkaline Phosphatase: 80 U/L (ref 39–117)
BUN: 21 mg/dL (ref 6–23)
CO2: 23 mEq/L (ref 19–32)
Calcium: 9.2 mg/dL (ref 8.4–10.5)
Chloride: 105 mEq/L (ref 96–112)
Creatinine, Ser: 1.44 mg/dL — ABNORMAL HIGH (ref 0.40–1.20)
Glucose, Bld: 92 mg/dL (ref 70–99)
Potassium: 3.7 mEq/L (ref 3.5–5.3)
Sodium: 139 mEq/L (ref 135–145)
Total Bilirubin: 0.4 mg/dL (ref 0.3–1.2)
Total Protein: 6.9 g/dL (ref 6.0–8.3)

## 2007-04-29 LAB — KAPPA/LAMBDA LIGHT CHAINS
Kappa free light chain: 124 mg/dL — ABNORMAL HIGH (ref 0.33–1.94)
Lambda Free Lght Chn: 1.19 mg/dL (ref 0.57–2.63)

## 2007-05-25 LAB — COMPREHENSIVE METABOLIC PANEL
CO2: 24 mEq/L (ref 19–32)
Creatinine, Ser: 1.2 mg/dL (ref 0.40–1.20)
Glucose, Bld: 85 mg/dL (ref 70–99)
Total Bilirubin: 0.5 mg/dL (ref 0.3–1.2)

## 2007-05-25 LAB — CBC WITH DIFFERENTIAL/PLATELET
BASO%: 4.1 % — ABNORMAL HIGH (ref 0.0–2.0)
Basophils Absolute: 0.4 10*3/uL — ABNORMAL HIGH (ref 0.0–0.1)
HCT: 31.5 % — ABNORMAL LOW (ref 34.8–46.6)
HGB: 10.9 g/dL — ABNORMAL LOW (ref 11.6–15.9)
MCHC: 34.6 g/dL (ref 32.0–36.0)
MONO#: 0.6 10*3/uL (ref 0.1–0.9)
NEUT%: 68.2 % (ref 39.6–76.8)
RDW: 12.3 % (ref 11.3–14.5)
WBC: 8.6 10*3/uL (ref 3.9–10.0)
lymph#: 1.1 10*3/uL (ref 0.9–3.3)

## 2007-05-25 LAB — LACTATE DEHYDROGENASE: LDH: 109 U/L (ref 94–250)

## 2007-05-27 LAB — SPEP & IFE WITH QIG
Alpha-1-Globulin: 5.5 % — ABNORMAL HIGH (ref 2.9–4.9)
Alpha-2-Globulin: 14.8 % — ABNORMAL HIGH (ref 7.1–11.8)
Beta Globulin: 5.4 % (ref 4.7–7.2)
Total Protein, Serum Electrophoresis: 6.9 g/dL (ref 6.0–8.3)

## 2007-05-30 LAB — KAPPA/LAMBDA LIGHT CHAINS

## 2007-06-18 ENCOUNTER — Ambulatory Visit: Payer: Self-pay | Admitting: Oncology

## 2007-06-22 LAB — CBC WITH DIFFERENTIAL/PLATELET
Basophils Absolute: 0 10*3/uL (ref 0.0–0.1)
EOS%: 6.8 % (ref 0.0–7.0)
Eosinophils Absolute: 0.3 10*3/uL (ref 0.0–0.5)
HCT: 27 % — ABNORMAL LOW (ref 34.8–46.6)
HGB: 9.4 g/dL — ABNORMAL LOW (ref 11.6–15.9)
MCH: 31.8 pg (ref 26.0–34.0)
NEUT#: 2.8 10*3/uL (ref 1.5–6.5)
NEUT%: 57.6 % (ref 39.6–76.8)
lymph#: 1.2 10*3/uL (ref 0.9–3.3)

## 2007-06-24 LAB — SPEP & IFE WITH QIG
Albumin ELP: 55.5 % — ABNORMAL LOW (ref 55.8–66.1)
Beta Globulin: 4.9 % (ref 4.7–7.2)
IgA: 47 mg/dL — ABNORMAL LOW (ref 68–378)
IgG (Immunoglobin G), Serum: 1120 mg/dL (ref 694–1618)
Total Protein, Serum Electrophoresis: 6.6 g/dL (ref 6.0–8.3)

## 2007-06-24 LAB — BASIC METABOLIC PANEL
BUN: 11 mg/dL (ref 6–23)
CO2: 23 mEq/L (ref 19–32)
Glucose, Bld: 84 mg/dL (ref 70–99)
Potassium: 4 mEq/L (ref 3.5–5.3)

## 2007-06-27 LAB — KAPPA/LAMBDA LIGHT CHAINS

## 2007-07-19 ENCOUNTER — Encounter: Payer: Self-pay | Admitting: Internal Medicine

## 2007-07-19 LAB — COMPREHENSIVE METABOLIC PANEL
Albumin: 4.2 g/dL (ref 3.5–5.2)
Alkaline Phosphatase: 73 U/L (ref 39–117)
BUN: 22 mg/dL (ref 6–23)
CO2: 20 mEq/L (ref 19–32)
Calcium: 9.6 mg/dL (ref 8.4–10.5)
Chloride: 103 mEq/L (ref 96–112)
Glucose, Bld: 122 mg/dL — ABNORMAL HIGH (ref 70–99)
Potassium: 3.5 mEq/L (ref 3.5–5.3)
Sodium: 136 mEq/L (ref 135–145)
Total Protein: 7.1 g/dL (ref 6.0–8.3)

## 2007-07-19 LAB — CBC WITH DIFFERENTIAL/PLATELET
Basophils Absolute: 0 10*3/uL (ref 0.0–0.1)
Eosinophils Absolute: 0 10*3/uL (ref 0.0–0.5)
HGB: 9.8 g/dL — ABNORMAL LOW (ref 11.6–15.9)
MCV: 93.8 fL (ref 81.0–101.0)
MONO#: 0.5 10*3/uL (ref 0.1–0.9)
MONO%: 8.6 % (ref 0.0–13.0)
NEUT#: 4.4 10*3/uL (ref 1.5–6.5)
RBC: 2.96 10*6/uL — ABNORMAL LOW (ref 3.70–5.32)
RDW: 20.3 % — ABNORMAL HIGH (ref 11.3–14.5)
WBC: 5.9 10*3/uL (ref 3.9–10.0)
lymph#: 1 10*3/uL (ref 0.9–3.3)

## 2007-07-21 LAB — SPEP & IFE WITH QIG
Alpha-1-Globulin: 5.2 % — ABNORMAL HIGH (ref 2.9–4.9)
Beta 2: 3.6 % (ref 3.2–6.5)
Gamma Globulin: 15.5 % (ref 11.1–18.8)
IgA: 40 mg/dL — ABNORMAL LOW (ref 68–378)
IgM, Serum: 55 mg/dL — ABNORMAL LOW (ref 60–263)

## 2007-07-21 LAB — KAPPA/LAMBDA LIGHT CHAINS: Lambda Free Lght Chn: <0.8 mg/dL (ref 0.57–2.63)

## 2007-07-26 ENCOUNTER — Ambulatory Visit (HOSPITAL_COMMUNITY): Admission: RE | Admit: 2007-07-26 | Discharge: 2007-07-26 | Payer: Self-pay | Admitting: Oncology

## 2007-08-12 ENCOUNTER — Ambulatory Visit: Payer: Self-pay | Admitting: Oncology

## 2007-08-16 ENCOUNTER — Encounter: Payer: Self-pay | Admitting: Internal Medicine

## 2007-08-16 LAB — CBC WITH DIFFERENTIAL/PLATELET
BASO%: 1.1 % (ref 0.0–2.0)
EOS%: 2.5 % (ref 0.0–7.0)
HCT: 32.9 % — ABNORMAL LOW (ref 34.8–46.6)
MCH: 32.7 pg (ref 26.0–34.0)
MCHC: 35.2 g/dL (ref 32.0–36.0)
MONO#: 0.3 10*3/uL (ref 0.1–0.9)
NEUT%: 54.7 % (ref 39.6–76.8)
RBC: 3.54 10*6/uL — ABNORMAL LOW (ref 3.70–5.32)
RDW: 17.1 % — ABNORMAL HIGH (ref 11.3–14.5)
WBC: 3.8 10*3/uL — ABNORMAL LOW (ref 3.9–10.0)
lymph#: 1.3 10*3/uL (ref 0.9–3.3)

## 2007-08-18 LAB — SPEP & IFE WITH QIG
Albumin ELP: 55.1 % — ABNORMAL LOW (ref 55.8–66.1)
Alpha-2-Globulin: 13.8 % — ABNORMAL HIGH (ref 7.1–11.8)
IgA: 48 mg/dL — ABNORMAL LOW (ref 68–378)
IgG (Immunoglobin G), Serum: 1330 mg/dL (ref 694–1618)
Total Protein, Serum Electrophoresis: 7 g/dL (ref 6.0–8.3)

## 2007-08-18 LAB — COMPREHENSIVE METABOLIC PANEL
ALT: 17 U/L (ref 0–35)
AST: 21 U/L (ref 0–37)
CO2: 19 mEq/L (ref 19–32)
Calcium: 9.2 mg/dL (ref 8.4–10.5)
Chloride: 111 mEq/L (ref 96–112)
Creatinine, Ser: 0.92 mg/dL (ref 0.40–1.20)
Potassium: 3.9 mEq/L (ref 3.5–5.3)
Sodium: 140 mEq/L (ref 135–145)
Total Protein: 7 g/dL (ref 6.0–8.3)

## 2007-08-18 LAB — KAPPA/LAMBDA LIGHT CHAINS: Kappa free light chain: 93.5 mg/dL — ABNORMAL HIGH (ref 0.33–1.94)

## 2007-08-26 LAB — COMPREHENSIVE METABOLIC PANEL
AST: 16 U/L (ref 0–37)
Albumin: 3.9 g/dL (ref 3.5–5.2)
Alkaline Phosphatase: 86 U/L (ref 39–117)
Potassium: 3.9 mEq/L (ref 3.5–5.3)
Sodium: 138 mEq/L (ref 135–145)
Total Protein: 6.8 g/dL (ref 6.0–8.3)

## 2007-08-26 LAB — CBC WITH DIFFERENTIAL/PLATELET
EOS%: 5.1 % (ref 0.0–7.0)
MCH: 32.3 pg (ref 26.0–34.0)
MCHC: 35.3 g/dL (ref 32.0–36.0)
MCV: 91.5 fL (ref 81.0–101.0)
MONO%: 6.3 % (ref 0.0–13.0)
RBC: 3.72 10*6/uL (ref 3.70–5.32)
RDW: 13.5 % (ref 11.3–14.5)

## 2007-09-05 LAB — CBC WITH DIFFERENTIAL/PLATELET
Basophils Absolute: 0 10*3/uL (ref 0.0–0.1)
EOS%: 8.7 % — ABNORMAL HIGH (ref 0.0–7.0)
HGB: 12.6 g/dL (ref 11.6–15.9)
LYMPH%: 18.7 % (ref 14.0–48.0)
MCH: 31.8 pg (ref 26.0–34.0)
MCV: 91.9 fL (ref 81.0–101.0)
MONO%: 8.6 % (ref 0.0–13.0)
Platelets: 199 10*3/uL (ref 145–400)
RBC: 3.97 10*6/uL (ref 3.70–5.32)
RDW: 16.2 % — ABNORMAL HIGH (ref 11.3–14.5)

## 2007-09-14 ENCOUNTER — Encounter: Payer: Self-pay | Admitting: Internal Medicine

## 2007-09-14 ENCOUNTER — Ambulatory Visit: Payer: Self-pay | Admitting: Internal Medicine

## 2007-09-14 ENCOUNTER — Other Ambulatory Visit: Admission: RE | Admit: 2007-09-14 | Discharge: 2007-09-14 | Payer: Self-pay | Admitting: Internal Medicine

## 2007-09-14 DIAGNOSIS — C9001 Multiple myeloma in remission: Secondary | ICD-10-CM

## 2007-09-14 LAB — CBC WITH DIFFERENTIAL/PLATELET
BASO%: 3.5 % — ABNORMAL HIGH (ref 0.0–2.0)
EOS%: 5.3 % (ref 0.0–7.0)
Eosinophils Absolute: 0.2 10*3/uL (ref 0.0–0.5)
LYMPH%: 20.6 % (ref 14.0–48.0)
MCH: 32 pg (ref 26.0–34.0)
MCHC: 34.8 g/dL (ref 32.0–36.0)
MCV: 92 fL (ref 81.0–101.0)
MONO%: 10.2 % (ref 0.0–13.0)
NEUT#: 2.3 10*3/uL (ref 1.5–6.5)
RBC: 4 10*6/uL (ref 3.70–5.32)
RDW: 13.1 % (ref 11.3–14.5)

## 2007-09-14 LAB — CONVERTED CEMR LAB
Bilirubin Urine: NEGATIVE
Glucose, Urine, Semiquant: NEGATIVE
Ketones, urine, test strip: NEGATIVE
Nitrite: NEGATIVE
Protein, U semiquant: NEGATIVE
Specific Gravity, Urine: 1.02
Urobilinogen, UA: 0.2
WBC Urine, dipstick: NEGATIVE
pH: 5.5

## 2007-09-14 LAB — COMPREHENSIVE METABOLIC PANEL
ALT: 20 U/L (ref 0–35)
AST: 22 U/L (ref 0–37)
Albumin: 4.2 g/dL (ref 3.5–5.2)
Alkaline Phosphatase: 85 U/L (ref 39–117)
Potassium: 4.1 mEq/L (ref 3.5–5.3)
Sodium: 140 mEq/L (ref 135–145)
Total Bilirubin: 0.4 mg/dL (ref 0.3–1.2)
Total Protein: 7.1 g/dL (ref 6.0–8.3)

## 2007-09-16 LAB — KAPPA/LAMBDA LIGHT CHAINS
Kappa free light chain: 1.59 mg/dL (ref 0.33–1.94)
Kappa:Lambda Ratio: 1.89 — ABNORMAL HIGH (ref 0.26–1.65)
Lambda Free Lght Chn: 0.84 mg/dL (ref 0.57–2.63)

## 2007-09-16 LAB — IMMUNOFIXATION ELECTROPHORESIS
IgA: 26 mg/dL — ABNORMAL LOW (ref 68–378)
IgG (Immunoglobin G), Serum: 1220 mg/dL (ref 694–1618)
IgM, Serum: 56 mg/dL — ABNORMAL LOW (ref 60–263)
Total Protein, Serum Electrophoresis: 7.6 g/dL (ref 6.0–8.3)

## 2007-09-19 LAB — CBC WITH DIFFERENTIAL/PLATELET
BASO%: 4.5 % — ABNORMAL HIGH (ref 0.0–2.0)
EOS%: 1.7 % (ref 0.0–7.0)
MCH: 32.1 pg (ref 26.0–34.0)
MCHC: 35.2 g/dL (ref 32.0–36.0)
MONO#: 0.3 10*3/uL (ref 0.1–0.9)
NEUT%: 72.6 % (ref 39.6–76.8)
RBC: 3.94 10*6/uL (ref 3.70–5.32)
RDW: 12.9 % (ref 11.3–14.5)
WBC: 4.9 10*3/uL (ref 3.9–10.0)
lymph#: 0.8 10*3/uL — ABNORMAL LOW (ref 0.9–3.3)

## 2007-09-19 LAB — COMPREHENSIVE METABOLIC PANEL
ALT: 15 U/L (ref 0–35)
AST: 16 U/L (ref 0–37)
CO2: 18 mEq/L — ABNORMAL LOW (ref 19–32)
Calcium: 8.4 mg/dL (ref 8.4–10.5)
Chloride: 110 mEq/L (ref 96–112)
Creatinine, Ser: 0.77 mg/dL (ref 0.40–1.20)
Potassium: 4 mEq/L (ref 3.5–5.3)
Sodium: 139 mEq/L (ref 135–145)
Total Protein: 6.9 g/dL (ref 6.0–8.3)

## 2007-09-23 LAB — CBC WITH DIFFERENTIAL/PLATELET
BASO%: 5.5 % — ABNORMAL HIGH (ref 0.0–2.0)
EOS%: 6.4 % (ref 0.0–7.0)
HCT: 36.8 % (ref 34.8–46.6)
LYMPH%: 32.1 % (ref 14.0–48.0)
MCH: 32.2 pg (ref 26.0–34.0)
MCHC: 35.9 g/dL (ref 32.0–36.0)
NEUT%: 49.3 % (ref 39.6–76.8)
RBC: 4.1 10*6/uL (ref 3.70–5.32)
lymph#: 1.2 10*3/uL (ref 0.9–3.3)

## 2007-09-23 LAB — COMPREHENSIVE METABOLIC PANEL
ALT: 13 U/L (ref 0–35)
AST: 16 U/L (ref 0–37)
Chloride: 108 mEq/L (ref 96–112)
Creatinine, Ser: 0.75 mg/dL (ref 0.40–1.20)
Sodium: 140 mEq/L (ref 135–145)
Total Bilirubin: 0.4 mg/dL (ref 0.3–1.2)

## 2007-09-26 ENCOUNTER — Ambulatory Visit: Payer: Self-pay | Admitting: Oncology

## 2007-09-26 LAB — COMPREHENSIVE METABOLIC PANEL
ALT: 14 U/L (ref 0–35)
AST: 19 U/L (ref 0–37)
Albumin: 3.9 g/dL (ref 3.5–5.2)
Alkaline Phosphatase: 73 U/L (ref 39–117)
Potassium: 4.2 mEq/L (ref 3.5–5.3)
Sodium: 136 mEq/L (ref 135–145)
Total Protein: 6.6 g/dL (ref 6.0–8.3)

## 2007-09-26 LAB — CBC WITH DIFFERENTIAL/PLATELET
EOS%: 6.9 % (ref 0.0–7.0)
Eosinophils Absolute: 0.4 10*3/uL (ref 0.0–0.5)
MCH: 31.9 pg (ref 26.0–34.0)
MCV: 89.7 fL (ref 81.0–101.0)
MONO%: 5.8 % (ref 0.0–13.0)
NEUT#: 3.3 10*3/uL (ref 1.5–6.5)
RBC: 4.19 10*6/uL (ref 3.70–5.32)
RDW: 12.9 % (ref 11.3–14.5)

## 2007-10-03 ENCOUNTER — Ambulatory Visit (HOSPITAL_COMMUNITY): Admission: RE | Admit: 2007-10-03 | Discharge: 2007-10-03 | Payer: Self-pay | Admitting: Oncology

## 2007-10-04 ENCOUNTER — Ambulatory Visit (HOSPITAL_COMMUNITY): Admission: RE | Admit: 2007-10-04 | Discharge: 2007-10-04 | Payer: Self-pay | Admitting: Oncology

## 2007-10-05 ENCOUNTER — Encounter: Payer: Self-pay | Admitting: Oncology

## 2007-10-05 ENCOUNTER — Ambulatory Visit: Payer: Self-pay | Admitting: Oncology

## 2007-10-05 ENCOUNTER — Ambulatory Visit (HOSPITAL_COMMUNITY): Admission: RE | Admit: 2007-10-05 | Discharge: 2007-10-05 | Payer: Self-pay | Admitting: Oncology

## 2007-10-12 ENCOUNTER — Encounter: Payer: Self-pay | Admitting: Internal Medicine

## 2007-10-12 LAB — CBC WITH DIFFERENTIAL/PLATELET
Basophils Absolute: 0.1 10*3/uL (ref 0.0–0.1)
EOS%: 1.4 % (ref 0.0–7.0)
MCH: 30.9 pg (ref 26.0–34.0)
MCV: 88.7 fL (ref 81.0–101.0)
MONO%: 11.4 % (ref 0.0–13.0)
RBC: 3.9 10*6/uL (ref 3.70–5.32)
RDW: 13.2 % (ref 11.3–14.5)

## 2007-10-14 LAB — SPEP & IFE WITH QIG
Albumin ELP: 57.7 % (ref 55.8–66.1)
Alpha-1-Globulin: 5.1 % — ABNORMAL HIGH (ref 2.9–4.9)
Beta 2: 3.8 % (ref 3.2–6.5)
IgM, Serum: 33 mg/dL — ABNORMAL LOW (ref 60–263)

## 2007-10-14 LAB — COMPREHENSIVE METABOLIC PANEL
ALT: 13 U/L (ref 0–35)
Albumin: 3.6 g/dL (ref 3.5–5.2)
CO2: 20 mEq/L (ref 19–32)
Calcium: 8.7 mg/dL (ref 8.4–10.5)
Chloride: 111 mEq/L (ref 96–112)
Creatinine, Ser: 0.84 mg/dL (ref 0.40–1.20)
Total Protein: 6.3 g/dL (ref 6.0–8.3)

## 2007-10-14 LAB — KAPPA/LAMBDA LIGHT CHAINS
Kappa free light chain: 0.72 mg/dL (ref 0.33–1.94)
Lambda Free Lght Chn: <0.8 mg/dL (ref 0.57–2.63)

## 2007-11-04 ENCOUNTER — Ambulatory Visit: Payer: Self-pay | Admitting: Oncology

## 2007-11-08 ENCOUNTER — Encounter: Payer: Self-pay | Admitting: Internal Medicine

## 2007-11-08 LAB — CBC WITH DIFFERENTIAL/PLATELET
BASO%: 1.6 % (ref 0.0–2.0)
Basophils Absolute: 0.1 10*3/uL (ref 0.0–0.1)
Eosinophils Absolute: 0.1 10*3/uL (ref 0.0–0.5)
HCT: 38.6 % (ref 34.8–46.6)
HGB: 13.2 g/dL (ref 11.6–15.9)
MCHC: 34.2 g/dL (ref 32.0–36.0)
MONO#: 0.4 10*3/uL (ref 0.1–0.9)
NEUT#: 3.5 10*3/uL (ref 1.5–6.5)
NEUT%: 64.8 % (ref 39.6–76.8)
WBC: 5.3 10*3/uL (ref 3.9–10.0)
lymph#: 1.2 10*3/uL (ref 0.9–3.3)

## 2007-11-26 ENCOUNTER — Ambulatory Visit: Payer: Self-pay | Admitting: Internal Medicine

## 2007-11-26 ENCOUNTER — Observation Stay (HOSPITAL_COMMUNITY): Admission: EM | Admit: 2007-11-26 | Discharge: 2007-11-26 | Payer: Self-pay | Admitting: Emergency Medicine

## 2007-12-05 ENCOUNTER — Telehealth: Payer: Self-pay | Admitting: Internal Medicine

## 2007-12-07 ENCOUNTER — Encounter: Payer: Self-pay | Admitting: Internal Medicine

## 2007-12-07 LAB — CBC WITH DIFFERENTIAL/PLATELET
BASO%: 0.2 % (ref 0.0–2.0)
EOS%: 2 % (ref 0.0–7.0)
MCH: 31.1 pg (ref 26.0–34.0)
MCHC: 35.7 g/dL (ref 32.0–36.0)
NEUT%: 67.7 % (ref 39.6–76.8)
RBC: 4.39 10*6/uL (ref 3.70–5.32)
RDW: 15.6 % — ABNORMAL HIGH (ref 11.3–14.5)
WBC: 6.5 10*3/uL (ref 3.9–10.0)
lymph#: 1.5 10*3/uL (ref 0.9–3.3)

## 2007-12-07 LAB — COMPREHENSIVE METABOLIC PANEL
CO2: 21 mEq/L (ref 19–32)
Calcium: 9.7 mg/dL (ref 8.4–10.5)
Creatinine, Ser: 0.76 mg/dL (ref 0.40–1.20)
Glucose, Bld: 101 mg/dL — ABNORMAL HIGH (ref 70–99)
Total Bilirubin: 0.6 mg/dL (ref 0.3–1.2)
Total Protein: 7.3 g/dL (ref 6.0–8.3)

## 2007-12-09 LAB — SPEP & IFE WITH QIG
Beta Globulin: 5.3 % (ref 4.7–7.2)
Gamma Globulin: 15.8 % (ref 11.1–18.8)
IgA: 45 mg/dL — ABNORMAL LOW (ref 68–378)
IgG (Immunoglobin G), Serum: 1300 mg/dL (ref 694–1618)
Total Protein, Serum Electrophoresis: 7.9 g/dL (ref 6.0–8.3)

## 2007-12-09 LAB — KAPPA/LAMBDA LIGHT CHAINS: Lambda Free Lght Chn: 0.85 mg/dL (ref 0.57–2.63)

## 2007-12-21 ENCOUNTER — Ambulatory Visit: Payer: Self-pay | Admitting: Oncology

## 2007-12-21 LAB — CBC WITH DIFFERENTIAL/PLATELET
Basophils Absolute: 0 10*3/uL (ref 0.0–0.1)
EOS%: 3.2 % (ref 0.0–7.0)
HCT: 34.3 % — ABNORMAL LOW (ref 34.8–46.6)
HGB: 12.3 g/dL (ref 11.6–15.9)
LYMPH%: 21.5 % (ref 14.0–48.0)
MCH: 31.4 pg (ref 26.0–34.0)
MCV: 87.6 fL (ref 81.0–101.0)
MONO%: 6.5 % (ref 0.0–13.0)
NEUT%: 68.6 % (ref 39.6–76.8)
Platelets: 292 10*3/uL (ref 145–400)
RDW: 15.5 % — ABNORMAL HIGH (ref 11.3–14.5)

## 2007-12-21 LAB — COMPREHENSIVE METABOLIC PANEL
AST: 25 U/L (ref 0–37)
Alkaline Phosphatase: 75 U/L (ref 39–117)
BUN: 11 mg/dL (ref 6–23)
Creatinine, Ser: 0.76 mg/dL (ref 0.40–1.20)
Glucose, Bld: 127 mg/dL — ABNORMAL HIGH (ref 70–99)
Total Bilirubin: 0.4 mg/dL (ref 0.3–1.2)

## 2007-12-28 LAB — CBC WITH DIFFERENTIAL/PLATELET
Basophils Absolute: 0.1 10*3/uL (ref 0.0–0.1)
EOS%: 2.4 % (ref 0.0–7.0)
Eosinophils Absolute: 0.1 10*3/uL (ref 0.0–0.5)
HCT: 35.9 % (ref 34.8–46.6)
HGB: 12.6 g/dL (ref 11.6–15.9)
MCH: 31.1 pg (ref 26.0–34.0)
MCV: 88.7 fL (ref 81.0–101.0)
MONO%: 6.7 % (ref 0.0–13.0)
NEUT#: 3.7 10*3/uL (ref 1.5–6.5)
NEUT%: 68.3 % (ref 39.6–76.8)

## 2007-12-28 LAB — COMPREHENSIVE METABOLIC PANEL
ALT: 21 U/L (ref 0–35)
AST: 29 U/L (ref 0–37)
Albumin: 3.8 g/dL (ref 3.5–5.2)
Alkaline Phosphatase: 60 U/L (ref 39–117)
Potassium: 4.2 mEq/L (ref 3.5–5.3)
Sodium: 139 mEq/L (ref 135–145)
Total Protein: 6.2 g/dL (ref 6.0–8.3)

## 2008-01-04 ENCOUNTER — Encounter: Payer: Self-pay | Admitting: Internal Medicine

## 2008-01-04 LAB — COMPREHENSIVE METABOLIC PANEL
ALT: 25 U/L (ref 0–35)
AST: 34 U/L (ref 0–37)
Albumin: 4.1 g/dL (ref 3.5–5.2)
BUN: 10 mg/dL (ref 6–23)
CO2: 23 mEq/L (ref 19–32)
Calcium: 9.2 mg/dL (ref 8.4–10.5)
Chloride: 107 mEq/L (ref 96–112)
Creatinine, Ser: 0.81 mg/dL (ref 0.40–1.20)
Potassium: 4.1 mEq/L (ref 3.5–5.3)

## 2008-01-04 LAB — CBC WITH DIFFERENTIAL/PLATELET
Basophils Absolute: 0.1 10*3/uL (ref 0.0–0.1)
EOS%: 2.6 % (ref 0.0–7.0)
HCT: 38.3 % (ref 34.8–46.6)
HGB: 13.3 g/dL (ref 11.6–15.9)
MONO#: 0.5 10*3/uL (ref 0.1–0.9)
NEUT#: 4.7 10*3/uL (ref 1.5–6.5)
NEUT%: 70.6 % (ref 39.6–76.8)
RDW: 14.3 % (ref 11.3–14.5)
WBC: 6.7 10*3/uL (ref 3.9–10.0)
lymph#: 1.3 10*3/uL (ref 0.9–3.3)

## 2008-01-05 ENCOUNTER — Ambulatory Visit: Payer: Self-pay | Admitting: Internal Medicine

## 2008-01-05 LAB — CONVERTED CEMR LAB
HCV Quantitative: >10000000 intl units/mL — ABNORMAL HIGH (ref <43)
Prothrombin Time: 11.4 s (ref 10.9–13.3)

## 2008-01-11 LAB — CBC WITH DIFFERENTIAL/PLATELET
BASO%: 0.8 % (ref 0.0–2.0)
EOS%: 3.4 % (ref 0.0–7.0)
HCT: 38.6 % (ref 34.8–46.6)
HGB: 13.1 g/dL (ref 11.6–15.9)
MCH: 30.1 pg (ref 26.0–34.0)
MCHC: 33.8 g/dL (ref 32.0–36.0)
MONO#: 0.4 10*3/uL (ref 0.1–0.9)
RDW: 13.7 % (ref 11.3–14.5)
WBC: 6.1 10*3/uL (ref 3.9–10.0)
lymph#: 1.1 10*3/uL (ref 0.9–3.3)

## 2008-01-11 LAB — COMPREHENSIVE METABOLIC PANEL
ALT: 34 U/L (ref 0–35)
AST: 45 U/L — ABNORMAL HIGH (ref 0–37)
Albumin: 3.9 g/dL (ref 3.5–5.2)
CO2: 21 mEq/L (ref 19–32)
Calcium: 8.7 mg/dL (ref 8.4–10.5)
Chloride: 107 mEq/L (ref 96–112)
Potassium: 4 mEq/L (ref 3.5–5.3)
Total Protein: 6.3 g/dL (ref 6.0–8.3)

## 2008-01-30 ENCOUNTER — Ambulatory Visit: Payer: Self-pay | Admitting: Oncology

## 2008-02-01 ENCOUNTER — Encounter: Payer: Self-pay | Admitting: Internal Medicine

## 2008-02-01 LAB — COMPREHENSIVE METABOLIC PANEL
AST: 60 U/L — ABNORMAL HIGH (ref 0–37)
Albumin: 4.1 g/dL (ref 3.5–5.2)
Alkaline Phosphatase: 61 U/L (ref 39–117)
Calcium: 9.5 mg/dL (ref 8.4–10.5)
Chloride: 108 mEq/L (ref 96–112)
Potassium: 4.6 mEq/L (ref 3.5–5.3)
Sodium: 137 mEq/L (ref 135–145)
Total Protein: 7 g/dL (ref 6.0–8.3)

## 2008-02-01 LAB — CBC WITH DIFFERENTIAL/PLATELET
BASO%: 3.6 % — ABNORMAL HIGH (ref 0.0–2.0)
HCT: 39.9 % (ref 34.8–46.6)
LYMPH%: 16.8 % (ref 14.0–48.0)
MCH: 30.5 pg (ref 26.0–34.0)
MCHC: 34 g/dL (ref 32.0–36.0)
MCV: 89.7 fL (ref 81.0–101.0)
MONO#: 0.8 10*3/uL (ref 0.1–0.9)
MONO%: 15 % — ABNORMAL HIGH (ref 0.0–13.0)
NEUT%: 62.1 % (ref 39.6–76.8)
Platelets: 235 10*3/uL (ref 145–400)
RBC: 4.45 10*6/uL (ref 3.70–5.32)

## 2008-02-03 LAB — KAPPA/LAMBDA LIGHT CHAINS: Kappa free light chain: 2.3 mg/dL — ABNORMAL HIGH (ref 0.33–1.94)

## 2008-02-03 LAB — SPEP & IFE WITH QIG
Albumin ELP: 56.7 % (ref 55.8–66.1)
Alpha-2-Globulin: 12.8 % — ABNORMAL HIGH (ref 7.1–11.8)
Beta Globulin: 5.6 % (ref 4.7–7.2)
IgG (Immunoglobin G), Serum: 1090 mg/dL (ref 694–1618)
Total Protein, Serum Electrophoresis: 6.8 g/dL (ref 6.0–8.3)

## 2008-02-06 ENCOUNTER — Ambulatory Visit (HOSPITAL_COMMUNITY): Admission: RE | Admit: 2008-02-06 | Discharge: 2008-02-06 | Payer: Self-pay | Admitting: Oncology

## 2008-02-08 LAB — CBC WITH DIFFERENTIAL/PLATELET
BASO%: 1.1 % (ref 0.0–2.0)
EOS%: 2.9 % (ref 0.0–7.0)
LYMPH%: 13.9 % — ABNORMAL LOW (ref 14.0–48.0)
MCHC: 33.9 g/dL (ref 32.0–36.0)
MCV: 89.5 fL (ref 81.0–101.0)
MONO%: 9.3 % (ref 0.0–13.0)
NEUT#: 6.5 10*3/uL (ref 1.5–6.5)
Platelets: 286 10*3/uL (ref 145–400)
RBC: 4.69 10*6/uL (ref 3.70–5.32)
RDW: 14.2 % (ref 11.3–14.5)

## 2008-02-08 LAB — COMPREHENSIVE METABOLIC PANEL
ALT: 51 U/L — ABNORMAL HIGH (ref 0–35)
AST: 59 U/L — ABNORMAL HIGH (ref 0–37)
Albumin: 4.4 g/dL (ref 3.5–5.2)
Alkaline Phosphatase: 76 U/L (ref 39–117)
Potassium: 4.6 mEq/L (ref 3.5–5.3)
Sodium: 139 mEq/L (ref 135–145)
Total Bilirubin: 0.3 mg/dL (ref 0.3–1.2)
Total Protein: 7.7 g/dL (ref 6.0–8.3)

## 2008-02-22 LAB — CBC WITH DIFFERENTIAL/PLATELET
EOS%: 3.1 % (ref 0.0–7.0)
Eosinophils Absolute: 0.2 10*3/uL (ref 0.0–0.5)
LYMPH%: 20.7 % (ref 14.0–48.0)
MCH: 30.4 pg (ref 26.0–34.0)
MCHC: 34.2 g/dL (ref 32.0–36.0)
MCV: 88.7 fL (ref 81.0–101.0)
MONO%: 12.5 % (ref 0.0–13.0)
NEUT#: 3.5 10*3/uL (ref 1.5–6.5)
Platelets: 244 10*3/uL (ref 145–400)
RBC: 4.73 10*6/uL (ref 3.70–5.32)

## 2008-02-22 LAB — COMPREHENSIVE METABOLIC PANEL
AST: 93 U/L — ABNORMAL HIGH (ref 0–37)
Alkaline Phosphatase: 68 U/L (ref 39–117)
Glucose, Bld: 98 mg/dL (ref 70–99)
Sodium: 138 mEq/L (ref 135–145)
Total Bilirubin: 0.2 mg/dL — ABNORMAL LOW (ref 0.3–1.2)
Total Protein: 7.3 g/dL (ref 6.0–8.3)

## 2008-02-28 ENCOUNTER — Encounter: Payer: Self-pay | Admitting: Internal Medicine

## 2008-02-28 LAB — COMPREHENSIVE METABOLIC PANEL
Alkaline Phosphatase: 68 U/L (ref 39–117)
BUN: 11 mg/dL (ref 6–23)
Creatinine, Ser: 0.73 mg/dL (ref 0.40–1.20)
Glucose, Bld: 109 mg/dL — ABNORMAL HIGH (ref 70–99)
Total Bilirubin: 0.3 mg/dL (ref 0.3–1.2)

## 2008-02-28 LAB — CBC WITH DIFFERENTIAL/PLATELET
Basophils Absolute: 0 10*3/uL (ref 0.0–0.1)
Eosinophils Absolute: 0.2 10*3/uL (ref 0.0–0.5)
HGB: 13.7 g/dL (ref 11.6–15.9)
LYMPH%: 22.3 % (ref 14.0–48.0)
MCV: 87.4 fL (ref 81.0–101.0)
MONO%: 9.5 % (ref 0.0–13.0)
NEUT#: 3.5 10*3/uL (ref 1.5–6.5)
Platelets: 217 10*3/uL (ref 145–400)

## 2008-03-06 ENCOUNTER — Encounter: Admission: RE | Admit: 2008-03-06 | Discharge: 2008-03-06 | Payer: Self-pay | Admitting: Oncology

## 2008-03-12 ENCOUNTER — Encounter (INDEPENDENT_AMBULATORY_CARE_PROVIDER_SITE_OTHER): Payer: Self-pay | Admitting: Radiology

## 2008-03-12 ENCOUNTER — Ambulatory Visit (HOSPITAL_COMMUNITY): Admission: RE | Admit: 2008-03-12 | Discharge: 2008-03-12 | Payer: Self-pay | Admitting: Radiology

## 2008-03-14 ENCOUNTER — Encounter: Payer: Self-pay | Admitting: Internal Medicine

## 2008-03-19 ENCOUNTER — Ambulatory Visit: Payer: Self-pay | Admitting: Oncology

## 2008-03-21 LAB — COMPREHENSIVE METABOLIC PANEL
ALT: 78 U/L — ABNORMAL HIGH (ref 0–35)
AST: 78 U/L — ABNORMAL HIGH (ref 0–37)
Alkaline Phosphatase: 66 U/L (ref 39–117)
CO2: 23 mEq/L (ref 19–32)
Creatinine, Ser: 0.73 mg/dL (ref 0.40–1.20)
Sodium: 137 mEq/L (ref 135–145)
Total Bilirubin: 0.4 mg/dL (ref 0.3–1.2)
Total Protein: 7.6 g/dL (ref 6.0–8.3)

## 2008-03-21 LAB — MANUAL DIFFERENTIAL
Basophil: 1 % (ref 0–2)
EOS: 6 % (ref 0–7)
Metamyelocytes: 0 % (ref 0–0)
Myelocytes: 0 % (ref 0–0)
PROMYELO: 0 % (ref 0–0)
nRBC: 0 % (ref 0–0)

## 2008-03-21 LAB — CBC WITH DIFFERENTIAL/PLATELET
MCH: 30.3 pg (ref 26.0–34.0)
MCV: 87.9 fL (ref 81.0–101.0)
RBC: 4.93 10*6/uL (ref 3.70–5.32)
RDW: 13.5 % (ref 11.3–14.5)

## 2008-04-03 ENCOUNTER — Encounter: Admission: RE | Admit: 2008-04-03 | Discharge: 2008-04-03 | Payer: Self-pay | Admitting: Radiology

## 2008-04-11 LAB — COMPREHENSIVE METABOLIC PANEL WITH GFR
ALT: 71 U/L — ABNORMAL HIGH (ref 0–35)
AST: 66 U/L — ABNORMAL HIGH (ref 0–37)
Albumin: 4.5 g/dL (ref 3.5–5.2)
Alkaline Phosphatase: 68 U/L (ref 39–117)
BUN: 12 mg/dL (ref 6–23)
CO2: 18 meq/L — ABNORMAL LOW (ref 19–32)
Calcium: 9.5 mg/dL (ref 8.4–10.5)
Chloride: 105 meq/L (ref 96–112)
Creatinine, Ser: 0.81 mg/dL (ref 0.40–1.20)
Glucose, Bld: 88 mg/dL (ref 70–99)
Potassium: 4.2 meq/L (ref 3.5–5.3)
Sodium: 137 meq/L (ref 135–145)
Total Bilirubin: 0.3 mg/dL (ref 0.3–1.2)
Total Protein: 7.9 g/dL (ref 6.0–8.3)

## 2008-04-11 LAB — CBC WITH DIFFERENTIAL/PLATELET
Basophils Absolute: 0.1 10*3/uL (ref 0.0–0.1)
Eosinophils Absolute: 0.3 10*3/uL (ref 0.0–0.5)
HCT: 43.9 % (ref 34.8–46.6)
HGB: 15.3 g/dL (ref 11.6–15.9)
MONO#: 0.5 10*3/uL (ref 0.1–0.9)
NEUT#: 3.9 10*3/uL (ref 1.5–6.5)
NEUT%: 59.6 % (ref 39.6–76.8)
RDW: 13.2 % (ref 11.3–14.5)
lymph#: 1.7 10*3/uL (ref 0.9–3.3)

## 2008-04-13 LAB — SPEP & IFE WITH QIG
Albumin ELP: 57.3 % (ref 55.8–66.1)
Alpha-1-Globulin: 4.4 % (ref 2.9–4.9)
Beta 2: 3.2 % (ref 3.2–6.5)
Beta Globulin: 5.5 % (ref 4.7–7.2)
IgA: 52 mg/dL — ABNORMAL LOW (ref 68–378)
Total Protein, Serum Electrophoresis: 8.1 g/dL (ref 6.0–8.3)

## 2008-04-13 LAB — KAPPA/LAMBDA LIGHT CHAINS
Kappa free light chain: 1.89 mg/dL (ref 0.33–1.94)
Lambda Free Lght Chn: 1.13 mg/dL (ref 0.57–2.63)

## 2008-04-25 LAB — CBC WITH DIFFERENTIAL/PLATELET
Basophils Absolute: 0 10*3/uL (ref 0.0–0.1)
EOS%: 2.1 % (ref 0.0–7.0)
Eosinophils Absolute: 0.1 10*3/uL (ref 0.0–0.5)
HGB: 14.4 g/dL (ref 11.6–15.9)
LYMPH%: 23 % (ref 14.0–48.0)
MCH: 30.1 pg (ref 26.0–34.0)
MCV: 87.6 fL (ref 81.0–101.0)
MONO%: 6.1 % (ref 0.0–13.0)
NEUT#: 4 10*3/uL (ref 1.5–6.5)
Platelets: 207 10*3/uL (ref 145–400)
RBC: 4.8 10*6/uL (ref 3.70–5.32)

## 2008-04-25 LAB — BASIC METABOLIC PANEL
BUN: 13 mg/dL (ref 6–23)
CO2: 19 mEq/L (ref 19–32)
Chloride: 110 mEq/L (ref 96–112)
Potassium: 4.2 mEq/L (ref 3.5–5.3)

## 2008-04-30 ENCOUNTER — Ambulatory Visit: Payer: Self-pay | Admitting: Oncology

## 2008-05-02 LAB — CBC WITH DIFFERENTIAL/PLATELET
BASO%: 2.2 % — ABNORMAL HIGH (ref 0.0–2.0)
Basophils Absolute: 0.1 10*3/uL (ref 0.0–0.1)
HCT: 41.6 % (ref 34.8–46.6)
HGB: 14.4 g/dL (ref 11.6–15.9)
MONO#: 0.6 10*3/uL (ref 0.1–0.9)
NEUT%: 65.8 % (ref 39.6–76.8)
WBC: 6.2 10*3/uL (ref 3.9–10.0)
lymph#: 1.3 10*3/uL (ref 0.9–3.3)

## 2008-05-09 ENCOUNTER — Encounter: Payer: Self-pay | Admitting: Internal Medicine

## 2008-05-16 ENCOUNTER — Encounter: Payer: Self-pay | Admitting: Internal Medicine

## 2008-05-16 LAB — CBC WITH DIFFERENTIAL/PLATELET
Basophils Absolute: 0 10*3/uL (ref 0.0–0.1)
EOS%: 1.6 % (ref 0.0–7.0)
Eosinophils Absolute: 0.1 10*3/uL (ref 0.0–0.5)
HCT: 42.5 % (ref 34.8–46.6)
HGB: 14.7 g/dL (ref 11.6–15.9)
MCH: 30.1 pg (ref 26.0–34.0)
MCV: 86.9 fL (ref 81.0–101.0)
NEUT#: 4.7 10*3/uL (ref 1.5–6.5)
NEUT%: 70.6 % (ref 39.6–76.8)
lymph#: 1.4 10*3/uL (ref 0.9–3.3)

## 2008-05-18 LAB — SPEP & IFE WITH QIG
Beta 2: 3 % — ABNORMAL LOW (ref 3.2–6.5)
Gamma Globulin: 18 % (ref 11.1–18.8)
IgA: 53 mg/dL — ABNORMAL LOW (ref 68–378)
IgG (Immunoglobin G), Serum: 1610 mg/dL (ref 694–1618)
IgM, Serum: 106 mg/dL (ref 60–263)

## 2008-05-18 LAB — COMPREHENSIVE METABOLIC PANEL
AST: 40 U/L — ABNORMAL HIGH (ref 0–37)
Albumin: 4.4 g/dL (ref 3.5–5.2)
BUN: 11 mg/dL (ref 6–23)
Calcium: 10.1 mg/dL (ref 8.4–10.5)
Chloride: 104 mEq/L (ref 96–112)
Creatinine, Ser: 0.87 mg/dL (ref 0.40–1.20)
Glucose, Bld: 89 mg/dL (ref 70–99)

## 2008-05-18 LAB — KAPPA/LAMBDA LIGHT CHAINS
Kappa free light chain: 3.42 mg/dL — ABNORMAL HIGH (ref 0.33–1.94)
Kappa:Lambda Ratio: 3.32 — ABNORMAL HIGH (ref 0.26–1.65)
Lambda Free Lght Chn: 1.03 mg/dL (ref 0.57–2.63)

## 2008-05-23 LAB — CBC WITH DIFFERENTIAL/PLATELET
BASO%: 0.5 % (ref 0.0–2.0)
Basophils Absolute: 0 10*3/uL (ref 0.0–0.1)
EOS%: 2.3 % (ref 0.0–7.0)
HCT: 43.7 % (ref 34.8–46.6)
HGB: 15.1 g/dL (ref 11.6–15.9)
MCH: 30 pg (ref 26.0–34.0)
MCHC: 34.6 g/dL (ref 32.0–36.0)
MONO#: 0.5 10*3/uL (ref 0.1–0.9)
NEUT%: 63.3 % (ref 39.6–76.8)
RDW: 14 % (ref 11.3–14.5)
WBC: 4.9 10*3/uL (ref 3.9–10.0)
lymph#: 1.2 10*3/uL (ref 0.9–3.3)

## 2008-05-23 LAB — BASIC METABOLIC PANEL
CO2: 22 mEq/L (ref 19–32)
Calcium: 9.8 mg/dL (ref 8.4–10.5)
Creatinine, Ser: 0.77 mg/dL (ref 0.40–1.20)
Sodium: 138 mEq/L (ref 135–145)

## 2008-06-06 LAB — CBC WITH DIFFERENTIAL/PLATELET
BASO%: 3.2 % — ABNORMAL HIGH (ref 0.0–2.0)
MCHC: 34.5 g/dL (ref 32.0–36.0)
MONO#: 0.7 10*3/uL (ref 0.1–0.9)
RBC: 4.72 10*6/uL (ref 3.70–5.32)
RDW: 13.2 % (ref 11.3–14.5)
WBC: 6.5 10*3/uL (ref 3.9–10.0)
lymph#: 1.4 10*3/uL (ref 0.9–3.3)

## 2008-06-06 LAB — COMPREHENSIVE METABOLIC PANEL
ALT: 69 U/L — ABNORMAL HIGH (ref 0–35)
CO2: 23 mEq/L (ref 19–32)
Calcium: 9.1 mg/dL (ref 8.4–10.5)
Chloride: 106 mEq/L (ref 96–112)
Creatinine, Ser: 0.79 mg/dL (ref 0.40–1.20)
Sodium: 137 mEq/L (ref 135–145)
Total Protein: 6.6 g/dL (ref 6.0–8.3)

## 2008-06-17 ENCOUNTER — Ambulatory Visit: Payer: Self-pay | Admitting: Oncology

## 2008-06-18 ENCOUNTER — Ambulatory Visit: Payer: Self-pay | Admitting: Oncology

## 2008-06-18 ENCOUNTER — Ambulatory Visit (HOSPITAL_COMMUNITY): Admission: RE | Admit: 2008-06-18 | Discharge: 2008-06-18 | Payer: Self-pay | Admitting: Oncology

## 2008-06-18 ENCOUNTER — Encounter: Payer: Self-pay | Admitting: Oncology

## 2008-07-24 ENCOUNTER — Encounter: Payer: Self-pay | Admitting: Internal Medicine

## 2008-07-24 LAB — CBC WITH DIFFERENTIAL/PLATELET
BASO%: 3.7 % — ABNORMAL HIGH (ref 0.0–2.0)
Basophils Absolute: 0.1 10*3/uL (ref 0.0–0.1)
EOS%: 0.2 % (ref 0.0–7.0)
HCT: 35.5 % (ref 34.8–46.6)
HGB: 12.4 g/dL (ref 11.6–15.9)
LYMPH%: 18.7 % (ref 14.0–48.0)
MCH: 30.8 pg (ref 26.0–34.0)
MCHC: 34.8 g/dL (ref 32.0–36.0)
MCV: 88.3 fL (ref 81.0–101.0)
MONO%: 18 % — ABNORMAL HIGH (ref 0.0–13.0)
NEUT%: 59.4 % (ref 39.6–76.8)
lymph#: 0.6 10*3/uL — ABNORMAL LOW (ref 0.9–3.3)

## 2008-07-26 LAB — COMPREHENSIVE METABOLIC PANEL
ALT: 28 U/L (ref 0–35)
AST: 32 U/L (ref 0–37)
Albumin: 4.3 g/dL (ref 3.5–5.2)
Calcium: 9.1 mg/dL (ref 8.4–10.5)
Chloride: 103 mEq/L (ref 96–112)
Potassium: 4.3 mEq/L (ref 3.5–5.3)
Sodium: 136 mEq/L (ref 135–145)
Total Protein: 7.8 g/dL (ref 6.0–8.3)

## 2008-07-26 LAB — KAPPA/LAMBDA LIGHT CHAINS
Kappa:Lambda Ratio: 1.74 — ABNORMAL HIGH (ref 0.26–1.65)
Lambda Free Lght Chn: 1.27 mg/dL (ref 0.57–2.63)

## 2008-07-26 LAB — IMMUNOFIXATION ELECTROPHORESIS
IgA: 146 mg/dL (ref 68–378)
IgM, Serum: 86 mg/dL (ref 60–263)

## 2008-07-31 ENCOUNTER — Encounter: Payer: Self-pay | Admitting: Internal Medicine

## 2008-07-31 LAB — COMPREHENSIVE METABOLIC PANEL
ALT: 21 U/L (ref 0–35)
AST: 24 U/L (ref 0–37)
Calcium: 9.3 mg/dL (ref 8.4–10.5)
Chloride: 104 mEq/L (ref 96–112)
Creatinine, Ser: 0.79 mg/dL (ref 0.40–1.20)

## 2008-07-31 LAB — CBC WITH DIFFERENTIAL/PLATELET
BASO%: 2 % (ref 0.0–2.0)
EOS%: 1.1 % (ref 0.0–7.0)
MCH: 30 pg (ref 26.0–34.0)
MCHC: 34.3 g/dL (ref 32.0–36.0)
NEUT%: 56.5 % (ref 39.6–76.8)
RBC: 4.18 10*6/uL (ref 3.70–5.32)
RDW: 14.7 % — ABNORMAL HIGH (ref 11.3–14.5)
lymph#: 1.9 10*3/uL (ref 0.9–3.3)

## 2008-08-03 ENCOUNTER — Ambulatory Visit: Payer: Self-pay | Admitting: Oncology

## 2008-08-07 ENCOUNTER — Encounter: Payer: Self-pay | Admitting: Internal Medicine

## 2008-08-07 LAB — COMPREHENSIVE METABOLIC PANEL
ALT: 19 U/L (ref 0–35)
Albumin: 4.3 g/dL (ref 3.5–5.2)
CO2: 23 mEq/L (ref 19–32)
Calcium: 10.2 mg/dL (ref 8.4–10.5)
Chloride: 104 mEq/L (ref 96–112)
Glucose, Bld: 140 mg/dL — ABNORMAL HIGH (ref 70–99)
Sodium: 138 mEq/L (ref 135–145)
Total Protein: 7.6 g/dL (ref 6.0–8.3)

## 2008-08-07 LAB — CBC WITH DIFFERENTIAL/PLATELET
BASO%: 1.5 % (ref 0.0–2.0)
Eosinophils Absolute: 0.3 10*3/uL (ref 0.0–0.5)
HCT: 38.1 % (ref 34.8–46.6)
MCHC: 34.1 g/dL (ref 32.0–36.0)
MONO#: 0.6 10*3/uL (ref 0.1–0.9)
NEUT#: 2.9 10*3/uL (ref 1.5–6.5)
Platelets: 206 10*3/uL (ref 145–400)
RBC: 4.25 10*6/uL (ref 3.70–5.32)
WBC: 5.4 10*3/uL (ref 3.9–10.0)
lymph#: 1.5 10*3/uL (ref 0.9–3.3)

## 2008-08-07 LAB — MAGNESIUM: Magnesium: 1.9 mg/dL (ref 1.5–2.5)

## 2008-08-07 LAB — PHOSPHORUS: Phosphorus: 4.2 mg/dL (ref 2.3–4.6)

## 2008-08-14 ENCOUNTER — Encounter: Payer: Self-pay | Admitting: Internal Medicine

## 2008-08-14 LAB — COMPREHENSIVE METABOLIC PANEL
AST: 28 U/L (ref 0–37)
Albumin: 4.3 g/dL (ref 3.5–5.2)
Alkaline Phosphatase: 59 U/L (ref 39–117)
BUN: 8 mg/dL (ref 6–23)
Calcium: 9.5 mg/dL (ref 8.4–10.5)
Chloride: 106 mEq/L (ref 96–112)
Glucose, Bld: 99 mg/dL (ref 70–99)
Potassium: 4.2 mEq/L (ref 3.5–5.3)
Sodium: 138 mEq/L (ref 135–145)
Total Protein: 7.8 g/dL (ref 6.0–8.3)

## 2008-08-14 LAB — CBC WITH DIFFERENTIAL/PLATELET
BASO%: 0.4 % (ref 0.0–2.0)
Basophils Absolute: 0 10*3/uL (ref 0.0–0.1)
EOS%: 6.9 % (ref 0.0–7.0)
MCH: 30.7 pg (ref 26.0–34.0)
MCHC: 34.4 g/dL (ref 32.0–36.0)
MCV: 89.3 fL (ref 81.0–101.0)
MONO%: 9.5 % (ref 0.0–13.0)
RBC: 4.24 10*6/uL (ref 3.70–5.32)
RDW: 16.2 % — ABNORMAL HIGH (ref 11.3–14.5)

## 2008-08-14 LAB — MAGNESIUM: Magnesium: 2.2 mg/dL (ref 1.5–2.5)

## 2008-08-14 LAB — PHOSPHORUS: Phosphorus: 3 mg/dL (ref 2.3–4.6)

## 2008-09-05 ENCOUNTER — Ambulatory Visit: Payer: Self-pay | Admitting: Oncology

## 2008-09-05 ENCOUNTER — Encounter: Payer: Self-pay | Admitting: Internal Medicine

## 2008-09-05 LAB — COMPREHENSIVE METABOLIC PANEL
Alkaline Phosphatase: 57 U/L (ref 39–117)
BUN: 10 mg/dL (ref 6–23)
Glucose, Bld: 95 mg/dL (ref 70–99)
Sodium: 139 mEq/L (ref 135–145)
Total Bilirubin: 0.4 mg/dL (ref 0.3–1.2)

## 2008-09-05 LAB — CBC WITH DIFFERENTIAL/PLATELET
Basophils Absolute: 0 10*3/uL (ref 0.0–0.1)
Eosinophils Absolute: 0.2 10*3/uL (ref 0.0–0.5)
LYMPH%: 25 % (ref 14.0–48.0)
MCV: 89.6 fL (ref 81.0–101.0)
MONO%: 8.4 % (ref 0.0–13.0)
NEUT#: 3.3 10*3/uL (ref 1.5–6.5)
Platelets: 265 10*3/uL (ref 145–400)
RBC: 4.57 10*6/uL (ref 3.70–5.32)

## 2008-09-05 LAB — PHOSPHORUS: Phosphorus: 4.2 mg/dL (ref 2.3–4.6)

## 2008-09-05 LAB — MAGNESIUM: Magnesium: 2.2 mg/dL (ref 1.5–2.5)

## 2008-10-05 ENCOUNTER — Encounter: Payer: Self-pay | Admitting: Internal Medicine

## 2008-10-05 LAB — CBC WITH DIFFERENTIAL/PLATELET
Basophils Absolute: 0 10*3/uL (ref 0.0–0.1)
EOS%: 2.5 % (ref 0.0–7.0)
HCT: 41.6 % (ref 34.8–46.6)
HGB: 14.3 g/dL (ref 11.6–15.9)
MCH: 30.6 pg (ref 26.0–34.0)
MCV: 89.3 fL (ref 81.0–101.0)
MONO%: 7.1 % (ref 0.0–13.0)
NEUT%: 67.9 % (ref 39.6–76.8)
Platelets: 251 10*3/uL (ref 145–400)

## 2008-10-09 LAB — COMPREHENSIVE METABOLIC PANEL
AST: 40 U/L — ABNORMAL HIGH (ref 0–37)
Alkaline Phosphatase: 61 U/L (ref 39–117)
BUN: 14 mg/dL (ref 6–23)
Calcium: 9.9 mg/dL (ref 8.4–10.5)
Chloride: 104 mEq/L (ref 96–112)
Creatinine, Ser: 0.87 mg/dL (ref 0.40–1.20)

## 2008-10-09 LAB — SPEP & IFE WITH QIG
Alpha-2-Globulin: 13.6 % — ABNORMAL HIGH (ref 7.1–11.8)
Beta 2: 2.7 % — ABNORMAL LOW (ref 3.2–6.5)
Beta Globulin: 5.8 % (ref 4.7–7.2)
IgG (Immunoglobin G), Serum: 1410 mg/dL (ref 694–1618)
Total Protein, Serum Electrophoresis: 7.5 g/dL (ref 6.0–8.3)

## 2008-10-09 LAB — KAPPA/LAMBDA LIGHT CHAINS: Kappa:Lambda Ratio: 1.93 — ABNORMAL HIGH (ref 0.26–1.65)

## 2008-11-09 ENCOUNTER — Ambulatory Visit: Payer: Self-pay | Admitting: Oncology

## 2008-12-25 ENCOUNTER — Ambulatory Visit: Payer: Self-pay | Admitting: Oncology

## 2008-12-27 ENCOUNTER — Encounter: Payer: Self-pay | Admitting: Internal Medicine

## 2008-12-27 LAB — CBC WITH DIFFERENTIAL/PLATELET
BASO%: 0.4 % (ref 0.0–2.0)
HCT: 43.7 % (ref 34.8–46.6)
LYMPH%: 22.2 % (ref 14.0–49.7)
MCHC: 34.3 g/dL (ref 31.5–36.0)
MONO#: 0.4 10*3/uL (ref 0.1–0.9)
NEUT%: 70.6 % (ref 38.4–76.8)
Platelets: 228 10*3/uL (ref 145–400)
WBC: 6.5 10*3/uL (ref 3.9–10.3)

## 2008-12-31 LAB — SPEP & IFE WITH QIG
Beta 2: 2.7 % — ABNORMAL LOW (ref 3.2–6.5)
Beta Globulin: 5.4 % (ref 4.7–7.2)
IgA: 56 mg/dL — ABNORMAL LOW (ref 68–378)
IgG (Immunoglobin G), Serum: 1310 mg/dL (ref 694–1618)

## 2008-12-31 LAB — COMPREHENSIVE METABOLIC PANEL
Albumin: 4.7 g/dL (ref 3.5–5.2)
BUN: 17 mg/dL (ref 6–23)
CO2: 23 mEq/L (ref 19–32)
Glucose, Bld: 91 mg/dL (ref 70–99)
Potassium: 4.2 mEq/L (ref 3.5–5.3)
Sodium: 139 mEq/L (ref 135–145)
Total Bilirubin: 0.4 mg/dL (ref 0.3–1.2)
Total Protein: 7.6 g/dL (ref 6.0–8.3)

## 2009-01-04 ENCOUNTER — Ambulatory Visit (HOSPITAL_COMMUNITY): Admission: RE | Admit: 2009-01-04 | Discharge: 2009-01-04 | Payer: Self-pay | Admitting: Oncology

## 2009-01-07 LAB — BASIC METABOLIC PANEL
BUN: 13 mg/dL (ref 6–23)
Chloride: 108 mEq/L (ref 96–112)
Creatinine, Ser: 0.71 mg/dL (ref 0.40–1.20)

## 2009-01-30 ENCOUNTER — Encounter: Payer: Self-pay | Admitting: Internal Medicine

## 2009-01-30 LAB — CBC WITH DIFFERENTIAL/PLATELET
Basophils Absolute: 0 10*3/uL (ref 0.0–0.1)
Eosinophils Absolute: 0.1 10*3/uL (ref 0.0–0.5)
HCT: 41.7 % (ref 34.8–46.6)
HGB: 14.1 g/dL (ref 11.6–15.9)
NEUT#: 3.2 10*3/uL (ref 1.5–6.5)
RDW: 15 % — ABNORMAL HIGH (ref 11.2–14.5)
lymph#: 1.8 10*3/uL (ref 0.9–3.3)

## 2009-02-01 LAB — SPEP & IFE WITH QIG
Alpha-1-Globulin: 7.9 % — ABNORMAL HIGH (ref 2.9–4.9)
Alpha-2-Globulin: 12.5 % — ABNORMAL HIGH (ref 7.1–11.8)
Beta 2: 2.1 % — ABNORMAL LOW (ref 3.2–6.5)
Beta Globulin: 5.3 % (ref 4.7–7.2)
Gamma Globulin: 15.8 % (ref 11.1–18.8)

## 2009-02-01 LAB — COMPREHENSIVE METABOLIC PANEL
Albumin: 4.6 g/dL (ref 3.5–5.2)
BUN: 13 mg/dL (ref 6–23)
CO2: 23 mEq/L (ref 19–32)
Calcium: 9.5 mg/dL (ref 8.4–10.5)
Chloride: 108 mEq/L (ref 96–112)
Glucose, Bld: 86 mg/dL (ref 70–99)
Potassium: 4.3 mEq/L (ref 3.5–5.3)

## 2009-02-01 LAB — KAPPA/LAMBDA LIGHT CHAINS
Kappa free light chain: 2.44 mg/dL — ABNORMAL HIGH (ref 0.33–1.94)
Kappa:Lambda Ratio: 2.18 — ABNORMAL HIGH (ref 0.26–1.65)
Lambda Free Lght Chn: 1.12 mg/dL (ref 0.57–2.63)

## 2009-03-04 ENCOUNTER — Ambulatory Visit: Payer: Self-pay | Admitting: Oncology

## 2009-03-11 ENCOUNTER — Encounter: Payer: Self-pay | Admitting: Internal Medicine

## 2009-03-11 LAB — COMPREHENSIVE METABOLIC PANEL
AST: 20 U/L (ref 0–37)
Albumin: 4.9 g/dL (ref 3.5–5.2)
BUN: 9 mg/dL (ref 6–23)
Calcium: 9.9 mg/dL (ref 8.4–10.5)
Chloride: 107 mEq/L (ref 96–112)
Glucose, Bld: 99 mg/dL (ref 70–99)
Potassium: 4 mEq/L (ref 3.5–5.3)
Sodium: 139 mEq/L (ref 135–145)
Total Protein: 8.2 g/dL (ref 6.0–8.3)

## 2009-03-11 LAB — CBC WITH DIFFERENTIAL/PLATELET
BASO%: 0.2 % (ref 0.0–2.0)
Basophils Absolute: 0 10*3/uL (ref 0.0–0.1)
HCT: 42.3 % (ref 34.8–46.6)
HGB: 14.7 g/dL (ref 11.6–15.9)
MONO#: 0.6 10*3/uL (ref 0.1–0.9)
NEUT#: 5.4 10*3/uL (ref 1.5–6.5)
NEUT%: 63.9 % (ref 38.4–76.8)
RDW: 14.1 % (ref 11.2–14.5)
WBC: 8.4 10*3/uL (ref 3.9–10.3)
lymph#: 2.4 10*3/uL (ref 0.9–3.3)

## 2009-04-03 ENCOUNTER — Telehealth (INDEPENDENT_AMBULATORY_CARE_PROVIDER_SITE_OTHER): Payer: Self-pay | Admitting: *Deleted

## 2009-04-05 ENCOUNTER — Ambulatory Visit: Payer: Self-pay | Admitting: Internal Medicine

## 2009-04-09 ENCOUNTER — Encounter: Payer: Self-pay | Admitting: Internal Medicine

## 2009-04-09 LAB — CBC WITH DIFFERENTIAL/PLATELET
Basophils Absolute: 0 10*3/uL (ref 0.0–0.1)
Eosinophils Absolute: 0.1 10*3/uL (ref 0.0–0.5)
HGB: 14.3 g/dL (ref 11.6–15.9)
LYMPH%: 13.9 % — ABNORMAL LOW (ref 14.0–49.7)
MCV: 89.9 fL (ref 79.5–101.0)
MONO%: 4.1 % (ref 0.0–14.0)
NEUT#: 6.3 10*3/uL (ref 1.5–6.5)
Platelets: 258 10*3/uL (ref 145–400)

## 2009-04-11 LAB — SPEP & IFE WITH QIG
Albumin ELP: 56.5 % (ref 55.8–66.1)
Alpha-1-Globulin: 5.5 % — ABNORMAL HIGH (ref 2.9–4.9)
IgM, Serum: 132 mg/dL (ref 60–263)
Total Protein, Serum Electrophoresis: 7.7 g/dL (ref 6.0–8.3)

## 2009-04-11 LAB — COMPREHENSIVE METABOLIC PANEL
ALT: 9 U/L (ref 0–35)
AST: 14 U/L (ref 0–37)
Albumin: 4.3 g/dL (ref 3.5–5.2)
Alkaline Phosphatase: 62 U/L (ref 39–117)
Glucose, Bld: 76 mg/dL (ref 70–99)
Potassium: 3.9 mEq/L (ref 3.5–5.3)
Sodium: 139 mEq/L (ref 135–145)
Total Protein: 7.7 g/dL (ref 6.0–8.3)

## 2009-04-11 LAB — KAPPA/LAMBDA LIGHT CHAINS: Lambda Free Lght Chn: 1.08 mg/dL (ref 0.57–2.63)

## 2009-05-01 ENCOUNTER — Ambulatory Visit: Payer: Self-pay | Admitting: Oncology

## 2009-05-07 LAB — BASIC METABOLIC PANEL
Chloride: 106 mEq/L (ref 96–112)
Potassium: 3.6 mEq/L (ref 3.5–5.3)

## 2009-06-03 ENCOUNTER — Ambulatory Visit: Payer: Self-pay | Admitting: Oncology

## 2009-06-03 ENCOUNTER — Encounter: Payer: Self-pay | Admitting: Internal Medicine

## 2009-06-03 LAB — CBC WITH DIFFERENTIAL/PLATELET
BASO%: 0.4 % (ref 0.0–2.0)
Basophils Absolute: 0 10e3/uL (ref 0.0–0.1)
EOS%: 1.8 % (ref 0.0–7.0)
Eosinophils Absolute: 0.1 10e3/uL (ref 0.0–0.5)
HCT: 43.9 % (ref 34.8–46.6)
HGB: 15.1 g/dL (ref 11.6–15.9)
LYMPH%: 31.6 % (ref 14.0–49.7)
MCH: 30.2 pg (ref 25.1–34.0)
MCHC: 34.4 g/dL (ref 31.5–36.0)
MCV: 87.8 fL (ref 79.5–101.0)
MONO#: 0.6 10e3/uL (ref 0.1–0.9)
MONO%: 8 % (ref 0.0–14.0)
NEUT#: 4.1 10e3/uL (ref 1.5–6.5)
NEUT%: 58.2 % (ref 38.4–76.8)
Platelets: 176 10e3/uL (ref 145–400)
RBC: 5 10e6/uL (ref 3.70–5.45)
RDW: 14 % (ref 11.2–14.5)
WBC: 7.1 10e3/uL (ref 3.9–10.3)
lymph#: 2.3 10e3/uL (ref 0.9–3.3)

## 2009-06-05 LAB — SPEP & IFE WITH QIG
Albumin ELP: 57.1 % (ref 55.8–66.1)
IgA: 80 mg/dL (ref 68–378)
IgM, Serum: 128 mg/dL (ref 60–263)
Total Protein, Serum Electrophoresis: 8.1 g/dL (ref 6.0–8.3)

## 2009-06-05 LAB — COMPREHENSIVE METABOLIC PANEL
ALT: 26 U/L (ref 0–35)
Alkaline Phosphatase: 70 U/L (ref 39–117)
CO2: 25 mEq/L (ref 19–32)
Potassium: 4.3 mEq/L (ref 3.5–5.3)
Sodium: 137 mEq/L (ref 135–145)
Total Bilirubin: 0.4 mg/dL (ref 0.3–1.2)
Total Protein: 8.1 g/dL (ref 6.0–8.3)

## 2009-06-05 LAB — KAPPA/LAMBDA LIGHT CHAINS: Kappa free light chain: 1.7 mg/dL (ref 0.33–1.94)

## 2009-07-03 ENCOUNTER — Ambulatory Visit: Payer: Self-pay | Admitting: Oncology

## 2009-07-03 LAB — BASIC METABOLIC PANEL WITH GFR
BUN: 11 mg/dL (ref 6–23)
CO2: 21 meq/L (ref 19–32)
Calcium: 9.1 mg/dL (ref 8.4–10.5)
Chloride: 111 meq/L (ref 96–112)
Creatinine, Ser: 0.74 mg/dL (ref 0.40–1.20)
Glucose, Bld: 78 mg/dL (ref 70–99)
Potassium: 4.1 meq/L (ref 3.5–5.3)
Sodium: 140 meq/L (ref 135–145)

## 2009-07-31 ENCOUNTER — Encounter (INDEPENDENT_AMBULATORY_CARE_PROVIDER_SITE_OTHER): Payer: Self-pay | Admitting: *Deleted

## 2009-07-31 LAB — CBC WITH DIFFERENTIAL/PLATELET
Basophils Absolute: 0 10*3/uL (ref 0.0–0.1)
EOS%: 1.5 % (ref 0.0–7.0)
Eosinophils Absolute: 0.1 10*3/uL (ref 0.0–0.5)
HCT: 41.2 % (ref 34.8–46.6)
LYMPH%: 29.6 % (ref 14.0–49.7)
MONO%: 6.4 % (ref 0.0–14.0)
WBC: 5.8 10*3/uL (ref 3.9–10.3)

## 2009-08-02 LAB — SPEP & IFE WITH QIG
Alpha-1-Globulin: 4.7 % (ref 2.9–4.9)
Beta 2: 3.3 % (ref 3.2–6.5)
Gamma Globulin: 17.8 % (ref 11.1–18.8)
IgA: 74 mg/dL (ref 68–378)
IgG (Immunoglobin G), Serum: 1400 mg/dL (ref 694–1618)
IgM, Serum: 130 mg/dL (ref 60–263)

## 2009-08-02 LAB — KAPPA/LAMBDA LIGHT CHAINS
Kappa:Lambda Ratio: 2.42 — ABNORMAL HIGH (ref 0.26–1.65)
Lambda Free Lght Chn: 0.88 mg/dL (ref 0.57–2.63)

## 2009-08-02 LAB — COMPREHENSIVE METABOLIC PANEL
AST: 26 U/L (ref 0–37)
Alkaline Phosphatase: 69 U/L (ref 39–117)
BUN: 12 mg/dL (ref 6–23)
Creatinine, Ser: 0.76 mg/dL (ref 0.40–1.20)
Glucose, Bld: 95 mg/dL (ref 70–99)
Total Bilirubin: 0.4 mg/dL (ref 0.3–1.2)

## 2009-08-21 ENCOUNTER — Ambulatory Visit: Payer: Self-pay | Admitting: Internal Medicine

## 2009-08-21 ENCOUNTER — Telehealth: Payer: Self-pay | Admitting: Internal Medicine

## 2009-08-21 DIAGNOSIS — Z9481 Bone marrow transplant status: Secondary | ICD-10-CM

## 2009-09-09 ENCOUNTER — Ambulatory Visit (HOSPITAL_COMMUNITY): Admission: RE | Admit: 2009-09-09 | Discharge: 2009-09-09 | Payer: Self-pay | Admitting: Anesthesiology

## 2009-09-30 ENCOUNTER — Ambulatory Visit: Payer: Self-pay | Admitting: Oncology

## 2009-10-02 ENCOUNTER — Encounter: Payer: Self-pay | Admitting: Internal Medicine

## 2009-10-02 LAB — CBC WITH DIFFERENTIAL/PLATELET
BASO%: 0.5 % (ref 0.0–2.0)
Basophils Absolute: 0 10*3/uL (ref 0.0–0.1)
HCT: 42.8 % (ref 34.8–46.6)
HGB: 14.4 g/dL (ref 11.6–15.9)
MONO#: 0.5 10*3/uL (ref 0.1–0.9)
NEUT#: 4 10*3/uL (ref 1.5–6.5)
NEUT%: 53.9 % (ref 38.4–76.8)
WBC: 7.5 10*3/uL (ref 3.9–10.3)
lymph#: 2.8 10*3/uL (ref 0.9–3.3)

## 2009-10-04 LAB — COMPREHENSIVE METABOLIC PANEL
Albumin: 4.4 g/dL (ref 3.5–5.2)
Alkaline Phosphatase: 55 U/L (ref 39–117)
Glucose, Bld: 75 mg/dL (ref 70–99)
Potassium: 3.9 mEq/L (ref 3.5–5.3)
Sodium: 135 mEq/L (ref 135–145)
Total Protein: 7 g/dL (ref 6.0–8.3)

## 2009-10-04 LAB — SPEP & IFE WITH QIG
Albumin ELP: 57.5 % (ref 55.8–66.1)
Alpha-1-Globulin: 4.3 % (ref 2.9–4.9)
Beta 2: 3.3 % (ref 3.2–6.5)
IgM, Serum: 116 mg/dL (ref 60–263)
Total Protein, Serum Electrophoresis: 7 g/dL (ref 6.0–8.3)

## 2009-10-04 LAB — KAPPA/LAMBDA LIGHT CHAINS: Lambda Free Lght Chn: 0.79 mg/dL (ref 0.57–2.63)

## 2009-12-23 ENCOUNTER — Ambulatory Visit (HOSPITAL_COMMUNITY): Admission: RE | Admit: 2009-12-23 | Discharge: 2009-12-23 | Payer: Self-pay | Admitting: Oncology

## 2009-12-27 ENCOUNTER — Ambulatory Visit: Payer: Self-pay | Admitting: Oncology

## 2009-12-31 ENCOUNTER — Encounter: Payer: Self-pay | Admitting: Internal Medicine

## 2009-12-31 LAB — CBC WITH DIFFERENTIAL/PLATELET
Eosinophils Absolute: 0.2 10*3/uL (ref 0.0–0.5)
HCT: 42.8 % (ref 34.8–46.6)
LYMPH%: 21.9 % (ref 14.0–49.7)
MONO#: 0.4 10*3/uL (ref 0.1–0.9)
NEUT#: 5.6 10*3/uL (ref 1.5–6.5)
NEUT%: 70.1 % (ref 38.4–76.8)
Platelets: 188 10*3/uL (ref 145–400)
WBC: 8 10*3/uL (ref 3.9–10.3)
lymph#: 1.8 10*3/uL (ref 0.9–3.3)

## 2010-01-02 LAB — SPEP & IFE WITH QIG
Alpha-2-Globulin: 12.2 % — ABNORMAL HIGH (ref 7.1–11.8)
Beta Globulin: 5.6 % (ref 4.7–7.2)
Gamma Globulin: 17 % (ref 11.1–18.8)
IgA: 79 mg/dL (ref 68–378)
IgG (Immunoglobin G), Serum: 1460 mg/dL (ref 694–1618)

## 2010-01-02 LAB — COMPREHENSIVE METABOLIC PANEL
BUN: 12 mg/dL (ref 6–23)
CO2: 18 mEq/L — ABNORMAL LOW (ref 19–32)
Calcium: 9.1 mg/dL (ref 8.4–10.5)
Chloride: 108 mEq/L (ref 96–112)
Creatinine, Ser: 0.66 mg/dL (ref 0.40–1.20)
Glucose, Bld: 109 mg/dL — ABNORMAL HIGH (ref 70–99)
Total Bilirubin: 0.4 mg/dL (ref 0.3–1.2)

## 2010-01-02 LAB — KAPPA/LAMBDA LIGHT CHAINS: Kappa:Lambda Ratio: 1.56 (ref 0.26–1.65)

## 2010-02-03 ENCOUNTER — Ambulatory Visit: Payer: Self-pay | Admitting: Internal Medicine

## 2010-02-03 LAB — CONVERTED CEMR LAB
Albumin: 4.2 g/dL (ref 3.5–5.2)
BUN: 9 mg/dL (ref 6–23)
Basophils Relative: 0.6 % (ref 0.0–3.0)
Bilirubin Urine: NEGATIVE
Bilirubin, Direct: 0.1 mg/dL (ref 0.0–0.3)
CO2: 27 meq/L (ref 19–32)
Chloride: 106 meq/L (ref 96–112)
Cholesterol: 221 mg/dL — ABNORMAL HIGH (ref 0–200)
Creatinine, Ser: 0.7 mg/dL (ref 0.4–1.2)
Direct LDL: 162.4 mg/dL
Eosinophils Absolute: 0.2 10*3/uL (ref 0.0–0.7)
Glucose, Urine, Semiquant: NEGATIVE
HCT: 43.7 % (ref 36.0–46.0)
Lymphs Abs: 3.2 10*3/uL (ref 0.7–4.0)
MCHC: 34.7 g/dL (ref 30.0–36.0)
MCV: 91.8 fL (ref 78.0–100.0)
Monocytes Absolute: 0.5 10*3/uL (ref 0.1–1.0)
Neutrophils Relative %: 48.3 % (ref 43.0–77.0)
RBC: 4.76 M/uL (ref 3.87–5.11)
TSH: 1.24 microintl units/mL (ref 0.35–5.50)
Total CHOL/HDL Ratio: 4
Total Protein: 7.6 g/dL (ref 6.0–8.3)
pH: 5

## 2010-02-11 ENCOUNTER — Ambulatory Visit: Payer: Self-pay | Admitting: Internal Medicine

## 2010-02-20 ENCOUNTER — Encounter: Admission: RE | Admit: 2010-02-20 | Discharge: 2010-02-20 | Payer: Self-pay | Admitting: Internal Medicine

## 2010-03-10 ENCOUNTER — Telehealth: Payer: Self-pay | Admitting: Internal Medicine

## 2010-03-11 ENCOUNTER — Ambulatory Visit: Payer: Self-pay | Admitting: Internal Medicine

## 2010-03-11 DIAGNOSIS — I10 Essential (primary) hypertension: Secondary | ICD-10-CM

## 2010-03-18 ENCOUNTER — Ambulatory Visit: Payer: Self-pay | Admitting: Internal Medicine

## 2010-04-01 ENCOUNTER — Ambulatory Visit: Payer: Self-pay | Admitting: Oncology

## 2010-04-02 ENCOUNTER — Encounter: Payer: Self-pay | Admitting: Internal Medicine

## 2010-04-02 LAB — COMPREHENSIVE METABOLIC PANEL
AST: 29 U/L (ref 0–37)
BUN: 13 mg/dL (ref 6–23)
Calcium: 9.6 mg/dL (ref 8.4–10.5)
Chloride: 108 mEq/L (ref 96–112)
Creatinine, Ser: 0.75 mg/dL (ref 0.40–1.20)
Total Bilirubin: 0.6 mg/dL (ref 0.3–1.2)

## 2010-04-02 LAB — CBC WITH DIFFERENTIAL/PLATELET
Basophils Absolute: 0 10*3/uL (ref 0.0–0.1)
Eosinophils Absolute: 0.1 10*3/uL (ref 0.0–0.5)
HGB: 15.7 g/dL (ref 11.6–15.9)
MCV: 90.4 fL (ref 79.5–101.0)
MONO#: 0.6 10*3/uL (ref 0.1–0.9)
NEUT#: 5 10*3/uL (ref 1.5–6.5)
RBC: 5.13 10*6/uL (ref 3.70–5.45)
RDW: 13.9 % (ref 11.2–14.5)
WBC: 8.9 10*3/uL (ref 3.9–10.3)

## 2010-04-04 LAB — SPEP & IFE WITH QIG
Beta 2: 3.3 % (ref 3.2–6.5)
Beta Globulin: 5.6 % (ref 4.7–7.2)
Gamma Globulin: 17.1 % (ref 11.1–18.8)
IgA: 93 mg/dL (ref 68–378)
IgG (Immunoglobin G), Serum: 1540 mg/dL (ref 694–1618)
IgM, Serum: 145 mg/dL (ref 60–263)

## 2010-04-04 LAB — KAPPA/LAMBDA LIGHT CHAINS
Kappa free light chain: 1.38 mg/dL (ref 0.33–1.94)
Kappa:Lambda Ratio: 2.51 — ABNORMAL HIGH (ref 0.26–1.65)
Lambda Free Lght Chn: 0.55 mg/dL — ABNORMAL LOW (ref 0.57–2.63)

## 2010-04-15 ENCOUNTER — Ambulatory Visit: Payer: Self-pay | Admitting: Internal Medicine

## 2010-07-02 ENCOUNTER — Ambulatory Visit: Payer: Self-pay | Admitting: Oncology

## 2010-07-02 ENCOUNTER — Ambulatory Visit (HOSPITAL_COMMUNITY): Admission: RE | Admit: 2010-07-02 | Discharge: 2010-07-02 | Payer: Self-pay | Admitting: Oncology

## 2010-07-04 ENCOUNTER — Encounter: Payer: Self-pay | Admitting: Internal Medicine

## 2010-07-04 LAB — COMPREHENSIVE METABOLIC PANEL
Albumin: 4.3 g/dL (ref 3.5–5.2)
Alkaline Phosphatase: 66 U/L (ref 39–117)
BUN: 12 mg/dL (ref 6–23)
CO2: 24 mEq/L (ref 19–32)
Glucose, Bld: 93 mg/dL (ref 70–99)
Potassium: 4.2 mEq/L (ref 3.5–5.3)
Sodium: 137 mEq/L (ref 135–145)
Total Bilirubin: 0.8 mg/dL (ref 0.3–1.2)
Total Protein: 7.8 g/dL (ref 6.0–8.3)

## 2010-07-04 LAB — CBC WITH DIFFERENTIAL/PLATELET
Basophils Absolute: 0.1 10*3/uL (ref 0.0–0.1)
Eosinophils Absolute: 0.1 10*3/uL (ref 0.0–0.5)
HGB: 15.7 g/dL (ref 11.6–15.9)
LYMPH%: 38.6 % (ref 14.0–49.7)
MCV: 91.5 fL (ref 79.5–101.0)
MONO#: 0.7 10*3/uL (ref 0.1–0.9)
MONO%: 8.4 % (ref 0.0–14.0)
NEUT#: 3.9 10*3/uL (ref 1.5–6.5)
Platelets: 175 10*3/uL (ref 145–400)
WBC: 7.8 10*3/uL (ref 3.9–10.3)

## 2010-07-09 LAB — SPEP & IFE WITH QIG
Albumin ELP: 54.3 % — ABNORMAL LOW (ref 55.8–66.1)
Alpha-1-Globulin: 8.4 % — ABNORMAL HIGH (ref 2.9–4.9)
Alpha-2-Globulin: 12.8 % — ABNORMAL HIGH (ref 7.1–11.8)
Beta Globulin: 5.8 % (ref 4.7–7.2)
Total Protein, Serum Electrophoresis: 7.3 g/dL (ref 6.0–8.3)

## 2010-07-09 LAB — KAPPA/LAMBDA LIGHT CHAINS: Kappa free light chain: 2.26 mg/dL — ABNORMAL HIGH (ref 0.33–1.94)

## 2010-07-30 ENCOUNTER — Telehealth: Payer: Self-pay | Admitting: Internal Medicine

## 2010-08-07 ENCOUNTER — Ambulatory Visit: Payer: Self-pay | Admitting: Internal Medicine

## 2010-10-01 ENCOUNTER — Ambulatory Visit: Payer: Self-pay | Admitting: Oncology

## 2010-10-03 ENCOUNTER — Encounter: Payer: Self-pay | Admitting: Internal Medicine

## 2010-10-03 LAB — CBC WITH DIFFERENTIAL/PLATELET
BASO%: 0.6 % (ref 0.0–2.0)
Basophils Absolute: 0 10*3/uL (ref 0.0–0.1)
HCT: 43.2 % (ref 34.8–46.6)
HGB: 14.6 g/dL (ref 11.6–15.9)
LYMPH%: 36 % (ref 14.0–49.7)
MCH: 31.2 pg (ref 25.1–34.0)
MCHC: 33.9 g/dL (ref 31.5–36.0)
MONO#: 0.3 10*3/uL (ref 0.1–0.9)
NEUT%: 56.4 % (ref 38.4–76.8)
Platelets: 185 10*3/uL (ref 145–400)
WBC: 6.5 10*3/uL (ref 3.9–10.3)
lymph#: 2.3 10*3/uL (ref 0.9–3.3)

## 2010-10-07 LAB — KAPPA/LAMBDA LIGHT CHAINS
Kappa free light chain: 4.57 mg/dL — ABNORMAL HIGH (ref 0.33–1.94)
Kappa:Lambda Ratio: 3.78 — ABNORMAL HIGH (ref 0.26–1.65)
Lambda Free Lght Chn: 1.21 mg/dL (ref 0.57–2.63)

## 2010-10-07 LAB — COMPREHENSIVE METABOLIC PANEL
ALT: 32 U/L (ref 0–35)
BUN: 15 mg/dL (ref 6–23)
CO2: 19 mEq/L (ref 19–32)
Calcium: 9.3 mg/dL (ref 8.4–10.5)
Chloride: 107 mEq/L (ref 96–112)
Creatinine, Ser: 0.77 mg/dL (ref 0.40–1.20)
Total Bilirubin: 0.5 mg/dL (ref 0.3–1.2)

## 2010-10-07 LAB — SPEP & IFE WITH QIG
Alpha-2-Globulin: 12.1 % — ABNORMAL HIGH (ref 7.1–11.8)
Beta 2: 2.9 % — ABNORMAL LOW (ref 3.2–6.5)
Beta Globulin: 6 % (ref 4.7–7.2)
Gamma Globulin: 17.5 % (ref 11.1–18.8)
IgA: 91 mg/dL (ref 68–378)
IgG (Immunoglobin G), Serum: 1540 mg/dL (ref 694–1618)

## 2010-11-05 ENCOUNTER — Ambulatory Visit (HOSPITAL_COMMUNITY)
Admission: RE | Admit: 2010-11-05 | Discharge: 2010-11-05 | Payer: Self-pay | Source: Home / Self Care | Attending: Anesthesiology | Admitting: Anesthesiology

## 2010-11-06 ENCOUNTER — Encounter: Payer: Self-pay | Admitting: Internal Medicine

## 2010-11-20 ENCOUNTER — Other Ambulatory Visit: Payer: Self-pay | Admitting: Oncology

## 2010-11-20 DIAGNOSIS — C9000 Multiple myeloma not having achieved remission: Secondary | ICD-10-CM

## 2010-12-02 NOTE — Letter (Signed)
[facility]   Imported By: Maryln Gottron 04/28/2010 15:17:02  _____________________________________________________________________  External Attachment:    Type:   Image     Comment:   External Document

## 2010-12-02 NOTE — Letter (Signed)
Summary: [facility]  [facility]   Imported By: Maryln Gottron 10/08/2010 11:08:43  _____________________________________________________________________  External Attachment:    Type:   Image     Comment:   External Document

## 2010-12-02 NOTE — Assessment & Plan Note (Signed)
Summary: FLU SHOT//CCM  Nurse Visit   Allergies: 1)  Ibuprofen (Ibuprofen)  Orders Added: 1)  Admin 1st Vaccine [90471] 2)  Flu Vaccine 65yrs + [34742] Flu Vaccine Consent Questions     Do you have a history of severe allergic reactions to this vaccine? no    Any prior history of allergic reactions to egg and/or gelatin? no    Do you have a sensitivity to the preservative Thimersol? no    Do you have a past history of Guillan-Barre Syndrome? no    Do you currently have an acute febrile illness? no    Have you ever had a severe reaction to latex? no    Vaccine information given and explained to patient? yes    Are you currently pregnant? no    Lot Number:[id number]   Exp Date:05/02/2011   Site Given  Left Deltoid IM Romualdo Bolk, CMA (AAMA)  August 07, 2010 1:19 PM  .[email or web address]

## 2010-12-02 NOTE — Assessment & Plan Note (Signed)
Summary: 1 week fup//ccm   Vital Signs:  Patient profile:   60 year old female Menstrual status:  hysterectomy Weight:      126 pounds Temp:     98.5 degrees F oral Pulse rate:   80 / minute Pulse rhythm:   regular Resp:     12 per minute BP sitting:   138 / 90  (left arm) Cuff size:   regular  Vitals Entered By: Gladis Riffle, RN (Mar 18, 2010 10:58 AM) CC: 1 week FU from headache and nausea, saw Dr Kirtland Bouchard last week--nausea gone--stopped tramadol but taking valium usually at night 1-2x day Is Patient Diabetic? No   CC:  1 week FU from headache and nausea and saw Dr Kirtland Bouchard last week--nausea gone--stopped tramadol but taking valium usually at night 1-2x day.  History of Present Illness:  Follow-Up Visit      This is a 60 year old woman who presents for Follow-up visit.  The patient denies chest pain and palpitations.  Since the last visit the patient notes no new problems or concerns.  The patient reports taking meds as prescribed.  When questioned about possible medication side effects, the patient notes none.  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 Prn 2)  Opana 10 Mg Tabs (Oxymorphone Hcl) .... As Needed 3)  Lisinopril 10 Mg  Tabs (Lisinopril) .... One By Mouth Daily  Allergies: 1)  Ibuprofen (Ibuprofen)  Physical Exam  General:  Well-developed,well-nourished,in no acute distress; alert,appropriate and cooperative throughout examination; 150/98 Head:  normocephalic and atraumatic.   Eyes:  pupils equal and pupils round.   Ears:  R ear normal and L ear normal.     Impression & Recommendations:  Problem # 1:  HYPERTENSION (ICD-401.9) reasonable control headaches resolved continue current medications  Her updated medication list for this problem includes:    Lisinopril 10 Mg Tabs (Lisinopril) ..... One by mouth daily  BP today: 138/90 Prior BP: 160/100 (03/11/2010)  Labs Reviewed: K+: 4.3 (02/03/2010) Creat: : 0.7 (02/03/2010)   Chol: 221 (02/03/2010)   HDL: 51.40 (02/03/2010)   TG: 91.0 (02/03/2010)  Complete Medication List: 1)  Valium 5 Mg Tabs (Diazepam) .... Prn 2)  Opana 10 Mg Tabs (Oxymorphone hcl) .... As needed 3)  Lisinopril 10 Mg Tabs (Lisinopril) .... One by mouth daily  Patient Instructions: 1)  Please schedule a follow-up appointment in 1 month.

## 2010-12-02 NOTE — Letter (Signed)
Summary: [facility]  [facility]   Imported By: Maryln Gottron 08/12/2010 14:22:33  _____________________________________________________________________  External Attachment:    Type:   Image     Comment:   External Document

## 2010-12-02 NOTE — Assessment & Plan Note (Signed)
Summary: unrelenting headache/dm   Vital Signs:  Patient profile:   60 year old female Menstrual status:  hysterectomy Weight:      127 pounds Temp:     97.9 degrees F oral BP sitting:   160 / 100  (left arm) Cuff size:   regular  Vitals Entered By: Duard Brady LPN (Mar 11, 2010 3:44 PM) CC: c/o headache since saturday , nausea, light sensitivity - bp running high, 160's to 100's home machine Is Patient Diabetic? No   CC:  c/o headache since saturday , nausea, light sensitivity - bp running high, and 160's to 100's home machine.  History of Present Illness: 60 year old patient who has multiple medical problems including chronic low back pain   controlled on chronic narcotic analgesics.  She has a remote history of hypertension, but has been off medications, following a bone marrow transplantation.  For the past 4 days, she has had  headaches in the temporal and frontal regions.  She notes some mild nausea in the morning.  Headaches are related by her narcotic analgesics, but recur over the past 4 days.  She is also noted have hypertension and yesterday, lisinopril, was prescribed period she denies any fever or localized sinus pain, stiff neck, or other complaints  Preventive Screening-Counseling & Management  Alcohol-Tobacco     Smoking Status: current  Allergies: 1)  Ibuprofen (Ibuprofen)  Past History:  Past Medical History: Colonic polyps, hx of Depression Diverticulosis, colon hepatitis C mass lumbar spine---plasmacytoma---myeloma---Bone Marrow transplant (Sept 2009)---[facility] Hypertension  Review of Systems       The patient complains of headaches.  The patient denies anorexia, fever, weight loss, weight gain, vision loss, decreased hearing, hoarseness, chest pain, syncope, dyspnea on exertion, peripheral edema, prolonged cough, hemoptysis, abdominal pain, melena, hematochezia, severe indigestion/heartburn, hematuria, incontinence, genital sores, muscle weakness,  suspicious skin lesions, transient blindness, difficulty walking, depression, unusual weight change, abnormal bleeding, enlarged lymph nodes, angioedema, and breast masses.    Physical Exam  General:  Well-developed,well-nourished,in no acute distress; alert,appropriate and cooperative throughout examination; 150/98 Head:  Normocephalic and atraumatic without obvious abnormalities. No apparent alopecia or balding. Eyes:  No corneal or conjunctival inflammation noted. EOMI. Perrla. Funduscopic exam benign, without hemorrhages, exudates or papilledema. Vision grossly normal. Ears:  External ear exam shows no significant lesions or deformities.  Otoscopic examination reveals clear canals, tympanic membranes are intact bilaterally without bulging, retraction, inflammation or discharge. Hearing is grossly normal bilaterally. Mouth:  Oral mucosa and oropharynx without lesions or exudates.  Teeth in good repair. Neck:  No deformities, masses, or tenderness noted. no meningismus Lungs:  Normal respiratory effort, chest expands symmetrically. Lungs are clear to auscultation, no crackles or wheezes. Heart:  Normal rate and regular rhythm. S1 and S2 normal without gallop, murmur, click, rub or other extra sounds. Abdomen:  Bowel sounds positive,abdomen soft and non-tender without masses, organomegaly or hernias noted.   Impression & Recommendations:  Problem # 1:  HEADACHE, CHRONIC (ICD-784.0)  The following medications were removed from the medication list:    Opana Er 15 Mg Xr12h-tab (Oxymorphone hcl) .Marland Kitchen... Take 1 tablet by mouth two times a day Her updated medication list for this problem includes:    Opana 10 Mg Tabs (Oxymorphone hcl) .Marland Kitchen... As needed    Tramadol Hcl 50 Mg Tabs (Tramadol hcl) ..... One every 6 hours as needed for headache  The following medications were removed from the medication list:    Opana Er 15 Mg Xr12h-tab (Oxymorphone hcl) .Marland Kitchen... Take  1 tablet by mouth two times a day Her  updated medication list for this problem includes:    Opana 10 Mg Tabs (Oxymorphone hcl) .Marland Kitchen... As needed    Tramadol Hcl 50 Mg Tabs (Tramadol hcl) ..... One every 6 hours as needed for headache  Problem # 2:  HYPERTENSION (ICD-401.9)  Her updated medication list for this problem includes:    Lisinopril 10 Mg Tabs (Lisinopril) ..... One by mouth daily  Her updated medication list for this problem includes:    Lisinopril 10 Mg Tabs (Lisinopril) ..... One by mouth daily  Complete Medication List: 1)  Valium 5 Mg Tabs (Diazepam) .... Prn 2)  Opana 10 Mg Tabs (Oxymorphone hcl) .... As needed 3)  Lisinopril 10 Mg Tabs (Lisinopril) .... One by mouth daily 4)  Tramadol Hcl 50 Mg Tabs (Tramadol hcl) .... One every 6 hours as needed for headache  Patient Instructions: 1)  Please schedule a follow-up appointment in 1 week ( Dr Cato Mulligan) 2)  Limit your Sodium (Salt). Prescriptions: TRAMADOL HCL 50 MG TABS (TRAMADOL HCL) one every 6 hours as needed for headache  #50 x 0   Entered and Authorized by:   Gordy Savers  MD   Signed by:   Gordy Savers  MD on 03/11/2010   Method used:   Print then Give to Patient   RxID:   [id number] VALIUM 5 MG  TABS (DIAZEPAM) prn  #50 x 0   Entered and Authorized by:   Gordy Savers  MD   Signed by:   Gordy Savers  MD on 03/11/2010   Method used:   Print then Give to Patient   RxID:   [id number]

## 2010-12-02 NOTE — Assessment & Plan Note (Signed)
Summary: CPX // RS   Vital Signs:  Patient profile:   60 year old female Menstrual status:  hysterectomy Height:      63.5 inches Weight:      127 pounds BMI:     22.22 Pulse rate:   68 / minute Pulse rhythm:   regular Resp:     12 per minute BP sitting:   132 / 94  (left arm) Cuff size:   regular  Vitals Entered By: Gladis Riffle, RN (February 11, 2010 10:22 AM) CC: cpx with pap, labs done Is Patient Diabetic? No     Menstrual Status hysterectomy   CC:  cpx with pap and labs done.  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 Prn 2)  Opana Er 15 Mg Xr12h-Tab (Oxymorphone Hcl) .... Take 1 Tablet By Mouth Two Times A Day 3)  Opana 10 Mg Tabs (Oxymorphone Hcl) .... As Needed  Allergies: 1)  Ibuprofen (Ibuprofen)  Past History:  Past Medical History: Last updated: 04/05/2009 Colonic polyps, hx of Depression Diverticulosis, colon hepatitis C mass lumbar spine---plasmacytoma---myeloma---Bone Marrow transplant (Sept 2009)---[facility]  Past Surgical History: Last updated:  08/31/2006 Hysterectomy Oophorectomy Tumor behind Knee PUD---?surgery  Family History: Last updated: 08/31/2006 non contributory  Social History: Last updated: 08/31/2006 Currently not working  Risk Factors: Smoking Status: current (02/11/2010) Packs/Day: <0.25 (02/11/2010)  Social History: Packs/Day:  <0.25  Review of Systems       All other systems reviewed and were negative   Physical Exam  General:  alert and well-developed.   Head:  normocephalic and atraumatic.   Eyes:  pupils equal and pupils round.   Ears:  R ear normal and L ear normal.   Neck:  No deformities, masses, or tenderness noted. Chest Wall:  No deformities, masses, or tenderness noted. Lungs:  normal respiratory effort and no intercostal retractions.   Heart:  normal rate and regular rhythm.   Abdomen:  soft and non-tender.   Msk:  No deformity or scoliosis noted of thoracic or lumbar spine.   Extremities:  No clubbing, cyanosis, edema, or deformity noted   Neurologic:  cranial nerves II-XII intact and gait normal.    DTRs and strength normal   Impression & Recommendations:  Problem # 1:  MULTIPLE  MYELOMA (ICD-203.00) f/u oncology---she requests a different oncologist  Problem # 2:  BONE MARROW TRANSPLANTATION, HX OF (ICD-V42.81) f/u oncology  Problem # 3:  PREVENTIVE HEALTH CARE (ICD-V70.0) health maint UTD smoking cessation encouraged  Problem # 4:  BACK PAIN (ICD-724.5)  this is a progressive problem certainly could be a more significant problem  given her hx of plasmacytoma/MM needs further eval MRI back Her updated medication list for this problem includes:    Opana Er 15 Mg Xr12h-tab (Oxymorphone hcl) .Marland Kitchen... Take 1 tablet by mouth two times a day    Opana 10 Mg Tabs (Oxymorphone hcl) .Marland Kitchen... As needed  Orders: Radiology Referral (Radiology)  Complete Medication List: 1)  Valium 5 Mg Tabs (Diazepam) .... Prn 2)  Opana Er 15 Mg Xr12h-tab (Oxymorphone hcl) .... Take 1 tablet by  mouth two times a day 3)  Opana 10 Mg Tabs (Oxymorphone hcl) .... As needed  Other Orders: Tdap => 2yrs IM (16109) Admin 1st Vaccine (60454) Venipuncture (09811) Sedimentation Rate, non-automated (91478)    Immunizations Administered:  Tetanus Vaccine:    Vaccine Type: Tdap    Site: left deltoid    Mfr: [employer]    Dose: 0.5 ml    Route: IM    Given by: Gladis Riffle, RN    Exp. Date: 01/25/2012    Lot #: [id number]   Laboratory Results   Blood Tests     SED rate: 10 mm/hr  Comments: Rita Ohara  February 11, 2010 1:33 PM      Appended Document: Orders Update    Clinical Lists Changes  Orders: Added new Service order of Est. Patient Level III (57846) - Signed

## 2010-12-02 NOTE — Letter (Signed)
[facility]   Imported By: Maryln Gottron 01/29/2010 10:22:31  _____________________________________________________________________  External Attachment:    Type:   Image     Comment:   External Document

## 2010-12-02 NOTE — Progress Notes (Signed)
Summary: Refill--Valium  Phone Note Refill Request Message from:  Fax from Pharmacy on July 30, 2010 1:05 PM  Refills Requested: Medication #1:  VALIUM 5 MG  TABS Take 1 tablet by mouth once a day as needed   Last Refilled: 04/15/2010 [facility] [address]  Next Appointment Scheduled: none. Initial call taken by: Lucious Groves CMA,  July 30, 2010 1:06 PM  Follow-up for Phone Call        refill x one Follow-up by: Birdie Sons MD,  July 30, 2010 3:35 PM    Prescriptions: VALIUM 5 MG  TABS (DIAZEPAM) Take 1 tablet by mouth once a day as needed  #30 x 0   Entered by:   Lucious Groves CMA   Authorized by:   Birdie Sons MD   Signed by:   Lucious Groves CMA on 07/30/2010   Method used:   Telephoned to ...       [facility]  [address]  [phone]* (retail)       [address]       [place], [place]  [address]       Ph: [phone] or [phone]       Fax: [phone]   RxID:   [id number]

## 2010-12-02 NOTE — Progress Notes (Signed)
Summary: elevated BP  Phone Note Call from Patient   Caller: Patient Call For: Birdie Sons MD Summary of Call: BP 176/105, yesterday.  Later 165/104 Staying about the same today. Had headache with pain behind eye. [phone] Headache is better today. Initial call taken by: Lynann Beaver CMA,  Mar 10, 2010 10:33 AM  Follow-up for Phone Call        sounds like needs to start meds lisinopril 10 mg by mouth once daily #30/11 see me 4-8 weeks Follow-up by: Birdie Sons MD,  Mar 10, 2010 12:52 PM    New/Updated Medications: LISINOPRIL 10 MG TABS (LISINOPRIL) one by mouth daily Prescriptions: LISINOPRIL 10 MG TABS (LISINOPRIL) one by mouth daily  #30 x 11   Entered by:   Lynann Beaver CMA   Authorized by:   Birdie Sons MD   Signed by:   Lynann Beaver CMA on 03/10/2010   Method used:   Electronically to        [facility]  [phone]* (retail)       [address]       [place], [place]  [address]       Ph: [phone] or [phone]       Fax: [phone]   RxID:   [id number]

## 2010-12-02 NOTE — Assessment & Plan Note (Signed)
Summary: 1 month rov/njr   Vital Signs:  Patient profile:   60 year old female Menstrual status:  hysterectomy Weight:      124 pounds BMI:     21.70 Temp:     98.7 degrees F oral Pulse rate:   80 / minute Pulse rhythm:   regular Resp:     12 per minute BP sitting:   124 / 78  (left arm) Cuff size:   regular  Vitals Entered By: Gladis Riffle, RN (April 15, 2010 11:26 AM)  Serial Vital Signs/Assessments:  Time      Position  BP       Pulse  Resp  Temp     By                     120/90                         Birdie Sons MD  CC: 1 month rov, BP 140/90- 150/103 at home, headaches resolved Is Patient Diabetic? No   CC:  1 month rov, BP 140/90- 150/103 at home, and headaches resolved.  History of Present Illness: she feels much better htn---tolerating meds. 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 Prn 2)  Opana 10 Mg Tabs (Oxymorphone Hcl) .... As Needed 3)  Lisinopril 10 Mg Tabs (Lisinopril) .... One By Mouth Daily 4)  Zometa 4 Mg/11ml Conc (Zoledronic Acid) .... Every Three Months  Allergies: 1)  Ibuprofen (Ibuprofen)  Physical Exam  General:  Well-developed,well-nourished,in no acute  distress; alert,appropriate and cooperative throughout examination;  Head:  normocephalic and atraumatic.     Impression & Recommendations:  Problem # 1:  HYPERTENSION (ICD-401.9)  she uses a wrist cuff---may not be accurate she will bring cuff to callibrate against ours Her updated medication list for this problem includes:    Lisinopril 10 Mg Tabs (Lisinopril) ..... One by mouth daily  BP today: 124/78 Prior BP: 138/90 (03/18/2010)  Labs Reviewed: K+: 4.3 (02/03/2010) Creat: : 0.7 (02/03/2010)   Chol: 221 (02/03/2010)   HDL: 51.40 (02/03/2010)   TG: 91.0 (02/03/2010)  Problem # 2:  TOBACCO ABUSE (ICD-305.1) she understands need to quit she is not interested anxiety  valium side efects discussed  Complete Medication List: 1)  Valium 5 Mg Tabs (Diazepam) .... Take 1 tablet by mouth once a day as needed 2)  Opana 10 Mg Tabs (Oxymorphone hcl) .... As needed 3)  Lisinopril 10 Mg Tabs (Lisinopril) .... One by mouth daily 4)  Zometa 4 Mg/19ml Conc (Zoledronic acid) .... Every three months  Patient Instructions: 1)  schedule nurse visit to measure BP and compare to her  cuff Prescriptions: VALIUM 5 MG  TABS (DIAZEPAM) Take 1 tablet by mouth once a day as needed  #30 x 1   Entered and Authorized by:   Birdie Sons MD   Signed by:   Birdie Sons MD on 04/15/2010   Method used:   Print then Give to Patient   RxID:   [id number]

## 2010-12-02 NOTE — Letter (Signed)
Summary: [facility]  [facility]   Imported By: Maryln Gottron 11/14/2009 15:00:09  _____________________________________________________________________  External Attachment:    Type:   Image     Comment:   External Document

## 2010-12-02 NOTE — Assessment & Plan Note (Signed)
Summary: URI/LC   Vital Signs:  Patient profile:   60 year old female Weight:      112 pounds Temp:     98.2 degrees F oral Pulse rate:   70 / minute Pulse rhythm:   regular Resp:     14 per minute BP sitting:   110 / 72  Vitals Entered By: Lynann Beaver CMA (April 05, 2009 8:18 AM) CC: URI, URI symptoms Is Patient Diabetic? No Pain Assessment Patient in pain? no        CC:  URI and URI symptoms.  History of Present Illness:  URI Symptoms      This is a 60 year old woman who presents with URI symptoms.  The patient reports productive cough, but denies nasal congestion, clear nasal discharge, purulent nasal discharge, sore throat, dry cough, earache, and sick contacts.  Associated symptoms include wheezing.  The patient denies fever.  The patient also reports muscle aches.    no evidence of bacterial infection. call for any concerns, increased sxs, fever, persistence of sxs, wheeze, SOB.    Allergies: 1)  Ibuprofen (Ibuprofen)  Past History:  Past Medical History: Colonic polyps, hx of Depression Diverticulosis, colon hepatitis C mass lumbar spine---plasmacytoma---myeloma---Bone Marrow transplant (Sept 2009)---[facility]  Physical Exam  General:  Well-developed,well-nourished,in no acute distress; alert,appropriate and cooperative throughout examination Eyes:  pupils equal and pupils round.   Neck:  No deformities, masses, or tenderness noted. Lungs:  normal respiratory effort and no intercostal retractions.  Bilateral rhonchi and wheeze   Impression & Recommendations:  Problem # 1:  URI (ICD-465.9) 3week hx of URI sxs no evidence of bacterial infection. call for any concerns, increased sxs, fever, persistence of sxs, wheeze, SOB.   trial abx  Complete Medication List: 1)  Valium 5 Mg Tabs (Diazepam) .... Prn 2)  Opana Er 20 Mg Xr12h-tab (Oxymorphone hcl) .... As needed pain 3)  Doxycycline Hyclate 100 Mg Caps (Doxycycline hyclate) .... Take 1 tab twice a  day Prescriptions: DOXYCYCLINE HYCLATE 100 MG CAPS (DOXYCYCLINE HYCLATE) Take 1 tab twice a day  #20 x 0   Entered and Authorized by:   Birdie Sons MD   Signed by:   Birdie Sons MD on 04/05/2009   Method used:   Electronically to        [facility]  [phone]* (retail)       [address]       [place], [place]  [address]       Ph: [phone] or [phone]       Fax: [phone]   RxID:   [id number]

## 2010-12-04 NOTE — Letter (Signed)
Summary: [facility]  [facility]   Imported By: Maryln Gottron 11/18/2010 15:44:31  _____________________________________________________________________  External Attachment:    Type:   Image     Comment:   External Document

## 2010-12-09 ENCOUNTER — Encounter: Payer: Self-pay | Admitting: Internal Medicine

## 2010-12-09 ENCOUNTER — Ambulatory Visit (INDEPENDENT_AMBULATORY_CARE_PROVIDER_SITE_OTHER): Payer: PRIVATE HEALTH INSURANCE | Admitting: Internal Medicine

## 2010-12-09 VITALS — BP 112/78 | HR 107 | Temp 98.1°F | Ht 64.0 in | Wt 131.0 lb

## 2010-12-09 DIAGNOSIS — J069 Acute upper respiratory infection, unspecified: Secondary | ICD-10-CM | POA: Insufficient documentation

## 2010-12-09 MED ORDER — DOXYCYCLINE HYCLATE 100 MG PO TABS: 100.0000 mg | ORAL_TABLET | Freq: Two times a day (BID) | ORAL | Status: AC

## 2010-12-09 NOTE — Assessment & Plan Note (Signed)
Given  prescription to hold. Begin if no improvement of symptoms after thirty-six hours. Discussed over-the-counter symptom relief when necessary. Followup if no improvement or worsening.

## 2010-12-09 NOTE — Progress Notes (Signed)
  Subjective:    Patient ID: Kendra Johns, female    DOB: Nov 17, 1950, 60 y.o.   MRN: [id number]  HPI patient presents to clinic to work in for evaluation of cough. meds for a history of chest congestion cough now productive for brown sputum. Complains of fatigue in temperature between a hundred hundreds one. No hemoptysis. May note intermittent mild wheezing and shortness of breath though in no acute distress. No alleviating or exacerbating factors. Has had no known sick exposure. Continues to smoke tobacco. Does have history of multiple myeloma under surveillance.    Review of Systems  Constitutional: Positive for fever and fatigue. Negative for chills and activity change.  HENT: Positive for congestion and rhinorrhea. Negative for ear pain, facial swelling and ear discharge.   Eyes: Negative for pain, discharge and redness.  Respiratory: Positive for cough and wheezing.   Skin: Negative for color change and rash.       Objective:   Physical Exam  Constitutional: She appears well-developed and well-nourished. No distress.  HENT:  Head: Normocephalic and atraumatic.  Right Ear: External ear normal.  Left Ear: External ear normal.  Nose: Nose normal.  Mouth/Throat: Oropharynx is clear and moist. No oropharyngeal exudate.  Eyes: Conjunctivae are normal. Right eye exhibits no discharge. Left eye exhibits no discharge. No scleral icterus.  Pulmonary/Chest: Effort normal and breath sounds normal. No respiratory distress. She has no wheezes. She has no rales.  Lymphadenopathy:    She has no cervical adenopathy.  Neurological: She is alert.  Skin: Skin is warm and dry. No rash noted. She is not diaphoretic. No erythema.          Assessment & Plan:

## 2010-12-26 ENCOUNTER — Ambulatory Visit (HOSPITAL_COMMUNITY)
Admission: RE | Admit: 2010-12-26 | Discharge: 2010-12-26 | Disposition: A | Discharge status: Home / Self Care | Payer: PRIVATE HEALTH INSURANCE | Source: Ambulatory Visit | Attending: Oncology | Admitting: Oncology

## 2010-12-26 DIAGNOSIS — M899 Disorder of bone, unspecified: Secondary | ICD-10-CM | POA: Insufficient documentation

## 2010-12-26 DIAGNOSIS — M545 Low back pain, unspecified: Secondary | ICD-10-CM | POA: Insufficient documentation

## 2010-12-26 DIAGNOSIS — Z87898 Personal history of other specified conditions: Secondary | ICD-10-CM | POA: Insufficient documentation

## 2010-12-26 DIAGNOSIS — C9000 Multiple myeloma not having achieved remission: Secondary | ICD-10-CM

## 2010-12-26 DIAGNOSIS — M949 Disorder of cartilage, unspecified: Secondary | ICD-10-CM | POA: Insufficient documentation

## 2011-01-01 LAB — HM MAMMOGRAPHY: HM Mammogram: NORMAL

## 2011-01-02 ENCOUNTER — Other Ambulatory Visit: Payer: Self-pay | Admitting: Oncology

## 2011-01-02 ENCOUNTER — Encounter (HOSPITAL_BASED_OUTPATIENT_CLINIC_OR_DEPARTMENT_OTHER): Payer: PRIVATE HEALTH INSURANCE | Admitting: Oncology

## 2011-01-02 DIAGNOSIS — G894 Chronic pain syndrome: Secondary | ICD-10-CM

## 2011-01-02 DIAGNOSIS — I10 Essential (primary) hypertension: Secondary | ICD-10-CM

## 2011-01-02 DIAGNOSIS — C9000 Multiple myeloma not having achieved remission: Secondary | ICD-10-CM

## 2011-01-02 LAB — CBC WITH DIFFERENTIAL/PLATELET
EOS%: 2.4 % (ref 0.0–7.0)
Eosinophils Absolute: 0.2 10*3/uL (ref 0.0–0.5)
LYMPH%: 36 % (ref 14.0–49.7)
MCH: 30.5 pg (ref 25.1–34.0)
MCV: 90.7 fL (ref 79.5–101.0)
MONO%: 4.7 % (ref 0.0–14.0)
NEUT#: 4.3 10*3/uL (ref 1.5–6.5)
Platelets: 184 10*3/uL (ref 145–400)
RBC: 5.02 10*6/uL (ref 3.70–5.45)
RDW: 14 % (ref 11.2–14.5)

## 2011-01-08 LAB — COMPREHENSIVE METABOLIC PANEL
Albumin: 4.9 g/dL (ref 3.5–5.2)
Alkaline Phosphatase: 69 U/L (ref 39–117)
Chloride: 104 mEq/L (ref 96–112)
Glucose, Bld: 78 mg/dL (ref 70–99)
Potassium: 4.1 mEq/L (ref 3.5–5.3)
Sodium: 139 mEq/L (ref 135–145)
Total Protein: 8.4 g/dL — ABNORMAL HIGH (ref 6.0–8.3)

## 2011-01-08 LAB — SPEP & IFE WITH QIG
Albumin ELP: 56.4 % (ref 55.8–66.1)
Alpha-1-Globulin: 4.9 % (ref 2.9–4.9)
Beta 2: 3.2 % (ref 3.2–6.5)
IgM, Serum: 119 mg/dL (ref 60–263)

## 2011-01-08 LAB — KAPPA/LAMBDA LIGHT CHAINS: Lambda Free Lght Chn: 1.85 mg/dL (ref 0.57–2.63)

## 2011-01-14 ENCOUNTER — Ambulatory Visit (INDEPENDENT_AMBULATORY_CARE_PROVIDER_SITE_OTHER)
Admission: RE | Admit: 2011-01-14 | Discharge: 2011-01-14 | Disposition: A | Discharge status: Home / Self Care | Payer: PRIVATE HEALTH INSURANCE | Source: Ambulatory Visit | Attending: Family Medicine | Admitting: Family Medicine

## 2011-01-14 ENCOUNTER — Ambulatory Visit (INDEPENDENT_AMBULATORY_CARE_PROVIDER_SITE_OTHER): Payer: PRIVATE HEALTH INSURANCE | Admitting: Family Medicine

## 2011-01-14 ENCOUNTER — Encounter: Payer: Self-pay | Admitting: Family Medicine

## 2011-01-14 DIAGNOSIS — R102 Pelvic and perineal pain: Secondary | ICD-10-CM

## 2011-01-14 DIAGNOSIS — C9000 Multiple myeloma not having achieved remission: Secondary | ICD-10-CM

## 2011-01-14 DIAGNOSIS — N949 Unspecified condition associated with female genital organs and menstrual cycle: Secondary | ICD-10-CM

## 2011-01-14 NOTE — Progress Notes (Signed)
  Subjective:    Patient ID: Kendra Johns, female    DOB: 15-Dec-1950, 60 y.o.   MRN: [id number]  HPI  patient seen with nondescript poorly localized right groin pain. Onset one week ago. Pain with ambulation mostly. Some pain at rest. Severity is moderate. No injury. No skin rash. No recent falls. History of multiple myeloma. Bone scan a few weeks ago revealed stable lytic area right proximal femur. Question of developing lesion right intertrochanteric region. Patient denies appetite or weight changes.   Prior hx of bone marrow transplant.  Still smokes.  She has chronic pain which is followed by pain management Center.   Review of Systems  Constitutional: Negative for fever, chills, activity change and appetite change.  HENT: Negative for sore throat.   Respiratory: Negative for cough, shortness of breath and wheezing.   Cardiovascular: Negative for chest pain, palpitations and leg swelling.  Gastrointestinal: Negative for abdominal pain, blood in stool and abdominal distention.  Genitourinary: Negative for dysuria and hematuria.  Musculoskeletal: Negative for joint swelling.  Skin: Negative for rash and wound.  Neurological: Negative for weakness.  Hematological: Negative for adenopathy. Does not bruise/bleed easily.       Objective:   Physical Exam  the patient is alert and in no distress Neck supple no adenopathy Chest good auscultation Heart regular rhythm and rate  Abdomen soft nontender Extremities no edema. 1+ posterior tibial pulses bilaterally. Right hip full range of motion with no pain. Right groin examined no visible redness or swelling. No ecchymosis.  Back exam reveals no spinal tenderness. Straight leg raises negative       Assessment & Plan:   Right pelvic /hip pain in a patient with history of multiple myeloma and prior known stable lytic lesions right proximal femur by bone scan 3 weeks ago. Pain is new since that time. Start with plain films pelvis and  right hip. May need MRI scan to further evaluate.

## 2011-02-03 ENCOUNTER — Other Ambulatory Visit (INDEPENDENT_AMBULATORY_CARE_PROVIDER_SITE_OTHER): Payer: Medicare Other | Admitting: Internal Medicine

## 2011-02-03 DIAGNOSIS — E785 Hyperlipidemia, unspecified: Secondary | ICD-10-CM

## 2011-02-03 DIAGNOSIS — Z0000 Encounter for general adult medical examination without abnormal findings: Secondary | ICD-10-CM

## 2011-02-03 LAB — POCT URINALYSIS DIPSTICK
Bilirubin, UA: NEGATIVE
Blood, UA: NEGATIVE
Glucose, UA: NEGATIVE
Nitrite, UA: NEGATIVE
Urobilinogen, UA: 0.2

## 2011-02-03 LAB — CBC WITH DIFFERENTIAL/PLATELET
Basophils Relative: 0.5 % (ref 0.0–3.0)
Eosinophils Relative: 3.1 % (ref 0.0–5.0)
HCT: 43.7 % (ref 36.0–46.0)
MCV: 91.3 fl (ref 78.0–100.0)
Monocytes Relative: 5.2 % (ref 3.0–12.0)
Neutrophils Relative %: 53.2 % (ref 43.0–77.0)
Platelets: 212 10*3/uL (ref 150.0–400.0)
RBC: 4.78 Mil/uL (ref 3.87–5.11)
WBC: 7.4 10*3/uL (ref 4.5–10.5)

## 2011-02-04 LAB — LIPID PANEL
Cholesterol: 240 mg/dL — ABNORMAL HIGH (ref 0–200)
HDL: 43 mg/dL (ref >39.00)
Total CHOL/HDL Ratio: 6
Triglycerides: 93 mg/dL (ref 0.0–149.0)
VLDL: 18.6 mg/dL (ref 0.0–40.0)

## 2011-02-04 LAB — HEPATIC FUNCTION PANEL
Albumin: 4.1 g/dL (ref 3.5–5.2)
Total Bilirubin: 0.5 mg/dL (ref 0.3–1.2)

## 2011-02-04 LAB — LDL CHOLESTEROL, DIRECT: Direct LDL: 176.9 mg/dL

## 2011-02-04 LAB — BASIC METABOLIC PANEL
BUN: 11 mg/dL (ref 6–23)
Chloride: 108 mEq/L (ref 96–112)
Creatinine, Ser: 0.8 mg/dL (ref 0.4–1.2)
GFR: 101.1 mL/min (ref >60.00)
Potassium: 4.2 mEq/L (ref 3.5–5.1)

## 2011-02-04 LAB — TSH: TSH: 2.01 u[IU]/mL (ref 0.35–5.50)

## 2011-02-13 ENCOUNTER — Ambulatory Visit (INDEPENDENT_AMBULATORY_CARE_PROVIDER_SITE_OTHER): Payer: PRIVATE HEALTH INSURANCE | Admitting: Internal Medicine

## 2011-02-13 ENCOUNTER — Encounter: Payer: Self-pay | Admitting: Internal Medicine

## 2011-02-13 VITALS — BP 152/104 | HR 84 | Temp 99.0°F | Ht 64.0 in | Wt 126.0 lb

## 2011-02-13 DIAGNOSIS — R1031 Right lower quadrant pain: Secondary | ICD-10-CM

## 2011-02-13 DIAGNOSIS — E785 Hyperlipidemia, unspecified: Secondary | ICD-10-CM

## 2011-02-13 DIAGNOSIS — Z0000 Encounter for general adult medical examination without abnormal findings: Secondary | ICD-10-CM

## 2011-02-13 NOTE — Assessment & Plan Note (Signed)
I will not treat right now.

## 2011-02-13 NOTE — Progress Notes (Signed)
  Subjective:    Patient ID: Kendra Johns, female    DOB: [birth year], 60 y.o.   MRN: [id number]  HPI  Patient comes in for complete physical.  Patient also complains of abdominal discomfort. I reviewed Dr. Caryl Never note. She continues to have some right-sided abdominal pain. Symptoms are better than they were at the time of previous visit. Reviewed x-ray that revealed a presumed chronic lytic lesion but no fracture. She has some pelvic pain. She has infrequent bowel movements but this is typical for her.  Past Medical History  Diagnosis Date  . Depression   . Hypertension   . Hepatitis C   . Diverticulosis of colon    Past Surgical History  Procedure Date  . Abdominal hysterectomy   . Oophorectomy     reports that she has been smoking Cigarettes.  She has a 12 pack-year smoking history. She does not have any smokeless tobacco history on file. She reports that she does not drink alcohol or use illicit drugs. family history is not on file. Allergies  Allergen Reactions  . Ibuprofen     REACTION: gi     Review of Systems    patient denies chest pain, shortness of breath, orthopnea. Denies lower extremity edema, abdominal pain, change in appetite, change in bowel movements. Patient denies rashes, musculoskeletal complaints. No other specific complaints in a complete review of systems.    Objective:   Physical Exam  Well-developed well-nourished female in no acute distress. HEENT exam atraumatic, normocephalic, extraocular muscles are intact. Neck is supple. No jugular venous distention no thyromegaly. Chest clear to auscultation without increased work of breathing. Cardiac exam S1 and S2 are regular. Abdominal exam active bowel sounds, soft, tender to palpation in the right lower quadrant. Extremities no edema. Neurologic exam she is alert without any motor sensory deficits. Gait is normal.      Assessment & Plan:  Well visit. Health maintenance issues are up-to-date. I'll get  her last colonoscopy scanned into the chart.  Patient has a new complaint of abdominal pain. She is tender to touch in the right lower cautery. I think this needs further evaluation. We'll obtain a CT of the abdomen with contrast.

## 2011-02-19 ENCOUNTER — Other Ambulatory Visit: Payer: Self-pay | Admitting: *Deleted

## 2011-02-19 ENCOUNTER — Ambulatory Visit (INDEPENDENT_AMBULATORY_CARE_PROVIDER_SITE_OTHER)
Admission: RE | Admit: 2011-02-19 | Discharge: 2011-02-19 | Disposition: A | Discharge status: Home / Self Care | Payer: PRIVATE HEALTH INSURANCE | Source: Ambulatory Visit | Attending: Internal Medicine | Admitting: Internal Medicine

## 2011-02-19 DIAGNOSIS — C7952 Secondary malignant neoplasm of bone marrow: Secondary | ICD-10-CM

## 2011-02-19 DIAGNOSIS — R9389 Abnormal findings on diagnostic imaging of other specified body structures: Secondary | ICD-10-CM

## 2011-02-19 DIAGNOSIS — R1031 Right lower quadrant pain: Secondary | ICD-10-CM

## 2011-02-19 MED ORDER — METRONIDAZOLE 500 MG PO TABS: 500.0000 mg | ORAL_TABLET | Freq: Three times a day (TID) | ORAL | Status: AC

## 2011-02-19 MED ORDER — IOHEXOL 300 MG/ML  SOLN
80.0000 mL | Freq: Once | INTRAMUSCULAR | Status: AC | PRN
  Administered 2011-02-19: 80 mL via INTRAVENOUS

## 2011-02-19 MED ORDER — CIPROFLOXACIN HCL 500 MG PO TABS: 500.0000 mg | ORAL_TABLET | Freq: Two times a day (BID) | ORAL | Status: AC

## 2011-03-11 ENCOUNTER — Encounter: Payer: Self-pay | Admitting: Internal Medicine

## 2011-03-11 ENCOUNTER — Ambulatory Visit (INDEPENDENT_AMBULATORY_CARE_PROVIDER_SITE_OTHER): Payer: PRIVATE HEALTH INSURANCE | Admitting: Internal Medicine

## 2011-03-11 VITALS — BP 126/74 | HR 88 | Ht 64.0 in | Wt 126.0 lb

## 2011-03-11 DIAGNOSIS — R933 Abnormal findings on diagnostic imaging of other parts of digestive tract: Secondary | ICD-10-CM

## 2011-03-11 DIAGNOSIS — C9000 Multiple myeloma not having achieved remission: Secondary | ICD-10-CM

## 2011-03-11 DIAGNOSIS — B182 Chronic viral hepatitis C: Secondary | ICD-10-CM

## 2011-03-11 DIAGNOSIS — R1031 Right lower quadrant pain: Secondary | ICD-10-CM

## 2011-03-11 DIAGNOSIS — Z860100 Personal history of colon polyps, unspecified: Secondary | ICD-10-CM

## 2011-03-11 MED ORDER — PEG-KCL-NACL-NASULF-NA ASC-C 100 G PO SOLR: 1.0000 | Freq: Once | ORAL | Status: AC

## 2011-03-11 NOTE — Assessment & Plan Note (Signed)
Thickened cecum on CT scan. She also has right lower quadrant pain. Presumably this is related. Her bone scan suggested some possible wound back to only intertrochanteric part of the right femur, I don't think that's related to her right lower quadrant pain but wonder saw him. Last colonoscopy 2007, cecum was normal.  Colonoscopy is appropriate to investigate this and look for cause of this problem. Risks benefits and indications are explained she understands and agrees to proceed.

## 2011-03-11 NOTE — Progress Notes (Signed)
  Subjective:    Patient ID: Kendra Johns, female    DOB: 1951/07/21, 60 y.o.   MRN: [id number]  HPI 60 yo African-American woman with RLQ pain that began in last 1-2 months. CT scan showed thickened cecum and 1 week of cipro was prescribed. The pain was unchanged and persists. 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 Constipation is chronic and unchanged. No rectal bleeding. Did have vaginal pain a few months ago, ? Slight vaginal bleeding. She used cream for dry mucosa and that stopped. Colonoscopy 2007 6 polyps, 1-2 adenomas, diverticulosis and external hemorrhoids.  The past medical history, past surgical history, social history, family history, medications and allergies were reviewed and updated in the EMR database.  Review of Systems Positive for chronic bone pain, eyeglasses. All other systems negative or as above.    Objective:   Physical Exam 60 year old black woman in no acute distress well-developed well-nourished Eyes are anicteric Refractory lesions Neck supple no thyromegaly or mass No supraclavicular or cervical adenopathy Lungs clear Heart S1-S2 no rubs or gallops no jugular venous distention Abdomen is thin soft and tender in the right lower quadrant area without mass. It is soft there's no rebound there is some mild guarding in her habits. Rectal exam is deferred until colonoscopy Lower extremities are free of edema Skin free of acute rash, there are some very small ecchymotic areas on the shins and knees. Mood appropriate        Assessment & Plan:

## 2011-03-11 NOTE — Assessment & Plan Note (Addendum)
At some point she should be tested for HAV total Ab if not done previously. Vaccination would be appropriate if naive. Pneumovax, if not current also appropriate.

## 2011-03-11 NOTE — Patient Instructions (Signed)
Colonoscopy A colonoscopy is an exam to evaluate your entire colon. In this exam, your colon is cleansed. A long fiberoptic tube is inserted through your rectum and into your colon. The fiberoptic scope (endoscope) is a long bundle of enclosed and very flexible fibers. These fibers transmit light to the area examined and send images from that area to your caregiver. Discomfort is usually minimal. You may be given a drug to help you sleep (sedative) during or prior to the procedure. This exam helps to detect lumps (tumors), polyps, inflammation, and areas of bleeding. Your caregiver may also take a small piece of tissue (biopsy) that will be examined under a microscope. BEFORE THE PROCEDURE  A clear liquid diet may be required for 2 days before the exam.   Liquid injections (enemas) or laxatives may be required.   A large amount of electrolyte solution may be given to you to drink over a short period of time. This solution is used to clean out your colon.   You should be present 1 prior to your procedure or as directed by your caregiver.   Check in at the admissions desk to fill out necessary forms if not preregistered. There will be consent forms to sign prior to the procedure. If accompanied by friends or family, there is a waiting area for them while you are having your procedure.  LET YOUR CAREGIVER KNOW ABOUT:  Allergies to food or medicine.  Medicines taken, including vitamins, herbs, eyedrops, over-the-counter medicines, and creams.   Use of steroids (by mouth or creams).   Previous problems with anesthetics or numbing medicines.   History of bleeding problems or blood clots.  Previous surgery.   Other health problems, including diabetes and kidney problems.   Possibility of pregnancy, if this applies.   AFTER THE PROCEDURE  If you received a sedative and/or pain medicine, you will need to arrange for someone to drive you home.   Occasionally, there is a little blood passed  with the first bowel movement. DO NOT be concerned.  HOME CARE INSTRUCTIONS  It is not unusual to pass moderate amounts of gas and experience mild abdominal cramping following the procedure. This is due to air being used to inflate your colon during the exam. Walking or a warm pack on your belly (abdomen) may help.   You may resume all normal meals and activities after sedatives and medicines have worn off.   Only take over-the-counter or prescription medicines for pain, discomfort, or fever as directed by your caregiver. DO NOT use aspirin or blood thinners if a biopsy was taken. Consult your caregiver for medicine usage if biopsies were taken.  FINDING OUT THE RESULTS OF YOUR TEST Not all test results are available during your visit. If your test results are not back during the visit, make an appointment with your caregiver to find out the results. Do not assume everything is normal if you have not heard from your caregiver or the medical facility. It is important for you to follow up on all of your test results. SEEK IMMEDIATE MEDICAL CARE IF:     You pass large blood clots or fill a toilet with blood following the procedure. This may also occur 10 to 14 days following the procedure. This is more likely if a biopsy was taken.   You develop abdominal pain that keeps getting worse and cannot be relieved with medicine.  Document Released: 10/16/2000 Document Re-Released: 01/13/2010 [facility] Patient Information 2011 [place], [place]. Your Colonoscopy is scheduled  at [facility] Endoscopy department on 03/13/2011 at 1pm Pick up your MoviPrep from your pharmacy from today

## 2011-03-11 NOTE — Assessment & Plan Note (Addendum)
Etiology not clear, at this point would presume is related to the abnormal cecum seen on CT. Question if this is neoplastic or inflammatory. Await the colonoscopy. She is tender in this area as well. As per the CT assessment, question if they're some sort of problem related to her multiple myeloma.  Old records indicate she has had chronic recurrent RLQ pain though not in some time, and has IBS.

## 2011-03-13 ENCOUNTER — Other Ambulatory Visit: Payer: PRIVATE HEALTH INSURANCE | Admitting: Internal Medicine

## 2011-03-13 ENCOUNTER — Ambulatory Visit (HOSPITAL_COMMUNITY)
Admission: RE | Admit: 2011-03-13 | Discharge: 2011-03-13 | Disposition: A | Discharge status: Home / Self Care | Payer: PRIVATE HEALTH INSURANCE | Source: Ambulatory Visit | Attending: Internal Medicine | Admitting: Internal Medicine

## 2011-03-13 DIAGNOSIS — K644 Residual hemorrhoidal skin tags: Secondary | ICD-10-CM | POA: Insufficient documentation

## 2011-03-13 DIAGNOSIS — K648 Other hemorrhoids: Secondary | ICD-10-CM | POA: Insufficient documentation

## 2011-03-13 DIAGNOSIS — K649 Unspecified hemorrhoids: Secondary | ICD-10-CM

## 2011-03-13 DIAGNOSIS — K5730 Diverticulosis of large intestine without perforation or abscess without bleeding: Secondary | ICD-10-CM | POA: Insufficient documentation

## 2011-03-13 DIAGNOSIS — R1031 Right lower quadrant pain: Secondary | ICD-10-CM | POA: Insufficient documentation

## 2011-03-13 DIAGNOSIS — C9000 Multiple myeloma not having achieved remission: Secondary | ICD-10-CM | POA: Insufficient documentation

## 2011-03-13 DIAGNOSIS — R933 Abnormal findings on diagnostic imaging of other parts of digestive tract: Secondary | ICD-10-CM

## 2011-03-13 MED ORDER — POLYETHYLENE GLYCOL 3350 17 GM/SCOOP PO POWD: 17.0000 g | Freq: Every day | ORAL | Status: DC

## 2011-03-13 MED ORDER — AMITRIPTYLINE HCL 25 MG PO TABS: 25.0000 mg | ORAL_TABLET | Freq: Every day | ORAL | Status: DC

## 2011-03-13 NOTE — Patient Instructions (Addendum)
Amitriptyline and MiraLax are new medications. Amitriptyline is to help abdominal pain.  MiraLax is for constipation. Colonoscopy was ok. Follow-up with Dr. Cato Mulligan as planned. Call Dr. Marvell Fuller office to be seen in about 6-8 weeks. I

## 2011-03-17 ENCOUNTER — Ambulatory Visit (INDEPENDENT_AMBULATORY_CARE_PROVIDER_SITE_OTHER): Payer: PRIVATE HEALTH INSURANCE | Admitting: Internal Medicine

## 2011-03-17 ENCOUNTER — Encounter: Payer: Self-pay | Admitting: Internal Medicine

## 2011-03-17 DIAGNOSIS — M545 Low back pain, unspecified: Secondary | ICD-10-CM

## 2011-03-17 DIAGNOSIS — I10 Essential (primary) hypertension: Secondary | ICD-10-CM

## 2011-03-17 MED ORDER — LISINOPRIL 20 MG PO TABS: 20.0000 mg | ORAL_TABLET | Freq: Every day | ORAL | Status: DC

## 2011-03-17 NOTE — Progress Notes (Signed)
  Subjective:    Patient ID: Kendra Johns, female    DOB: [birth year], 60 y.o.   MRN: [id number]  HPI 60 year old patient who has a history of treated hypertension this has been controlled on lisinopril 5 mg daily. She presents today with a chief complaint of low back pain. There is radiation of pain to the right buttock and down the right leg. This has been not a problem since December when she had acute low back pain while lifting a bed. She did have the evaluation at that time and presently is being followed by Dr. Vear Clock. He has alternated a number of narcotic analgesics. She states the pain is the same pain since December of last year. No fever or other constitutional complaints. She did have a screening colonoscopy 4 days ago.   Review of Systems  Constitutional: Negative.   HENT: Negative for hearing loss, congestion, sore throat, rhinorrhea, dental problem, sinus pressure and tinnitus.   Eyes: Negative for pain, discharge and visual disturbance.  Respiratory: Negative for cough and shortness of breath.   Cardiovascular: Negative for chest pain, palpitations and leg swelling.  Gastrointestinal: Negative for nausea, vomiting, abdominal pain, diarrhea, constipation, blood in stool and abdominal distention.  Genitourinary: Negative for dysuria, urgency, frequency, hematuria, flank pain, vaginal bleeding, vaginal discharge, difficulty urinating, vaginal pain and pelvic pain.  Musculoskeletal: Positive for back pain and gait problem. Negative for joint swelling and arthralgias.  Skin: Negative for rash.  Neurological: Negative for dizziness, syncope, speech difficulty, weakness, numbness and headaches.  Hematological: Negative for adenopathy.  Psychiatric/Behavioral: Negative for behavioral problems, dysphoric mood and agitation. The patient is not nervous/anxious.        Objective:   Physical Exam  Constitutional: She is oriented to person, place, and time. She appears well-developed  and well-nourished.       Appears uncomfortable in no acute distress. Blood pressure 150/100  HENT:  Head: Normocephalic.  Right Ear: External ear normal.  Left Ear: External ear normal.  Mouth/Throat: Oropharynx is clear and moist.  Eyes: Conjunctivae and EOM are normal. Pupils are equal, round, and reactive to light.  Neck: Normal range of motion. Neck supple. No thyromegaly present.  Cardiovascular: Normal rate, regular rhythm, normal heart sounds and intact distal pulses.   Pulmonary/Chest: Effort normal and breath sounds normal.  Abdominal: Soft. Bowel sounds are normal. She exhibits no mass. There is no tenderness.  Lymphadenopathy:    She has no cervical adenopathy.  Neurological: She is alert and oriented to person, place, and time.  Skin: Skin is warm and dry. No rash noted.  Psychiatric: She has a normal mood and affect. Her behavior is normal.          Assessment & Plan:   Hypertension. Suboptimal control. We'll increase her lisinopril to 20 mg daily. Low salt diet encouraged she is asked to return in 4 weeks to reassess her blood pressure Chronic low back pain. Follow Dr. Vear Clock

## 2011-03-17 NOTE — H&P (Signed)
[name], [name]                ACCOUNT NO.:  [id number]   MEDICAL RECORD NO.:  [id number]          PATIENT TYPE:  INP   LOCATION:  1414                         FACILITY:  [facility]   PHYSICIAN:  Cheron Every, MD   DATE OF BIRTH:  August 18, 1951   DATE OF ADMISSION:  11/25/2007  DATE OF DISCHARGE:  11/26/2007                              HISTORY & PHYSICAL   HISTORY OF PRESENT ILLNESS:  [name] is a pleasant 60 year old African  American female with history of multiple myeloma awaiting stem cell  transplant to be done at [facility] in a week or so who presents with 2-day  history of intermittent left-sided chest pain that feels like a pulled  muscle.  The pain is worse lying down and worse with palpation.  She  denies any palpitations, however.  The pain is associated with left hand  pain and edema.  Denies any shortness of breath or any pleuritic  component.  She does smoke a pack per day and has been smoking for the  last 40 years.  She does have a history of hypertension and  hyperlipidemia but no coronary artery disease in her family.  She felt  like her symptoms were possibly related to starting Cymbalta that began  this week.  She has been holding her Opana today and requests to  continue holding it as she is concerned that maybe it is interacting  with her Cymbalta.  She denies any nausea, vomiting or diaphoresis.  She  had similar symptoms in the past.  She ruled out for an MI at that time  and seemed like at that time thought her pain was secondary to her  myeloma.   PAST MEDICAL HISTORY:  1. Myeloma status post Revlimid, dexamethasone and Velcade, currently      awaiting stem-cell transplant.  2. Hypertension.  3. Hyperlipidemia.  4. History of hepatitis B.  5. History of peptic ulcer disease.   MEDICATIONS:  1. Cymbalta 30 daily.  2. Oxycodone 15 mg one to two tablets q.6h. p.r.n.  3. Lisinopril HCT 20/12.5 mg daily.  4. Toprol-XL 50 daily.  5. Simvastatin 10 mg  daily.  6. Opana ER, which she is holding.  7. Diazepam 5 mg b.i.d.   ALLERGIES:  She says that she is allergic to NSAIDS but then states her  reason is that she has a history of ulcers.   FAMILY HISTORY:  Significant for leukemia in her mother, father deceased  secondary to lung disease, a sister with cancer, but no coronary artery  disease in her family.   SOCIAL HISTORY:  Denies any alcohol use.  No drug use.  She does smoke a  pack per day.  She is trying to quit; however, she has gone up on her  smoking and she has smoked for the last 40 years.   REVIEW OF SYSTEMS:  All systems reviewed and negative except previously  stated.   PHYSICAL EXAMINATION:  Temperature 98, blood pressure 129/80, heart rate  80, saturating 97% on room air.  GENERAL:  She is lying in the bed but no apparent  distress.  HEENT:  Normocephalic, atraumatic.  Oropharynx is clear.  CHEST:  Clear to auscultation bilaterally.  There is no tenderness to  palpation over the ribs.  CARDIOVASCULAR:  Regular rate and rhythm.  No murmurs, rubs or gallops.  There is tenderness to palpation on the left side of her chest wall,  however.  ABDOMEN:  Positive bowel sounds, soft, nontender, nondistended.  EXTREMITIES:  Without edema.   LABORATORIES:  Cardiac enzymes are negative.  Troponin less than 0.05.  Creatinine is 0.9.  White count is 6.  Hemoglobin 13.4, platelets 316.  D-dimer is less than 0.22.  Chest x-ray shows no acute cardiopulmonary  disease.  There is note of an old rib fracture.  EKG shows sinus rhythm,  heart rate of 60, no signs of ischemia.   IMPRESSION AND PLAN:  1. Chest pain.  This does appear to be more musculoskeletal in nature      versus related to her myeloma.  Less likely cardiac in nature.  She      has three cardiovascular risk factors but overall a TIMI score of      1.  She does not have any family history.  Will admit for pain      control and rule out for MI, cycle cardiac enzymes x3,  admit to      telemetry bed.  Will check EKG in the morning.  Will add on a      hemoglobin A1c and check a fasting lipid panel.  Will hold the      aspirin currently given her myeloma history and ulcer history and      given that this is most likely not cardiac disease.  Will use      morphine or oxycodone p.r.n. pain.  History does not appear to be      consistent with a PE.  She is saturating well on room air, does not      have a pleuritic component, and D-dimer is negative.  2. Hypertension.  Continue her home medications.  3. Anxiety.  Continue with benzodiazepine.  4. Hyperlipidemia.  Continue with statin.  5. Deep vein thrombosis prophylaxis with Lovenox.      Cheron Every, MD  Electronically Signed     MLW/MEDQ  D:  11/26/2007  T:  11/26/2007  Job:  [id number]

## 2011-03-17 NOTE — Assessment & Plan Note (Signed)
[facility]                         GASTROENTEROLOGY OFFICE NOTE   Kendra Johns, Kendra Johns                       MRN:          [id number]  DATE:01/05/2008                            DOB:          05/30/1951    REFERRING PHYSICIAN:  Blenda Nicely. Shadad   CHIEF COMPLAINT:  Evaluate liver with known hepatitis C, history of  hepatitis B, prior to high-dose chemotherapy.  She is being considered  for stem-cell transplantation for treatment of multiple myeloma.   ASSESSMENT:  A 60 year old African-American woman, who has a history of  hepatitis C and known positive antibody and positive viral load, which  we have in the chart, going back several years.  She has never had  treatment.  There was a history of hepatitis B, as evidenced by positive  hepatitis B core antibody, though I think she has resolved that.  Based  upon the available information I have now, I think she does not have  significant liver disease, due to her hepatitis C.  We will need to  confirm this.   PLAN:  Today, I have requested the records from Dr. Barbaraann Boys and  laboratories from Dr. Clelia Croft regarding her transaminases and liver  functions and will check a pro-time, hepatitis C, quantitative RNA and  hepatitis B quantitative RNA.  Further studies may be indicated.  I have  discussed this with Dr. Clelia Croft and it sounds like the main consideration  is to try to give an estimate as to how well she would tolerate high-  dose chemotherapy.  A liver biopsy could be indicated, but I think that,  given the nature of her disease and her failure to tolerate standard-  dose chemotherapy, I would probably not do that unless there was  specific information we were looking for, as she really needs  potentially life-saving treatment with the stem-cell transplant.   HISTORY:  As above, this lady has bony disease with multiple myeloma.  She has not responded well enough to standard-dose chemotherapy.  She  does describe some upper abdominal pain, constipation due to pain  medications.  She has lost 35 pounds since August.  There is some  occasional heartburn.  She has a history of hepatitis C positive  antibody, though she was mainly focused on hepatitis B and did not  actually realize today that she had hepatitis C.  She has apparently  been to the hepatitis C clinic here, though I do not have records of  that and we will ask for those.  In 2004, with Dr. Corinda Gubler, she had a  hepatitis C RNA quantitative of 6,960,000 copies.  She has had some mild  transaminitis in the past.  Her platelets have been normal and they are  now.  I do not have recent coags.   PAST MEDICAL HISTORY:  1. Hepatitis C.  2. Positive hepatitis B core antibody.  3. Multiple myeloma.  4. Anxiety and depression.  5. Hypertension.  6. Dyslipidemia.  7. Gastroesophageal reflux disease.  8. Colonic polyps with adenomatous polyps seen, last colonoscopy,      October 2007, also demonstrating diverticulosis  and external      hemorrhoids.  9. Prior appendectomy.  10.Prior hysterectomy.  11.History of sleep apnea.  12.Osteoarthritis.   FAMILY HISTORY:  Positive for alcoholism, no colon cancer.   SOCIAL HISTORY:  She is married.  She has worked in Office manager.  One son,  one daughter.  She is here with her daughter today.  She does smoke.  No  other tobacco, drugs or alcohol.   REVIEW OF SYSTEMS:  Muscle pains, confusion, depression and anxiety.  Back pain, insomnia, bone and joint pain, pedal edema.  All other  systems are negative.   PHYSICAL EXAM:  Reveals a thin, well-developed, well-nourished, black  woman.  Height 5 feet 4, weight 134 pounds, blood pressure 88/58, pulse 80.  Eyes anicteric.  Mouth and posterior pharynx free of lesions.  NECK:  Supple, without thyromegaly or mass.  CHEST:  Clear and resonant.  HEART:  S1, S2, no murmurs or gallops.  ABDOMEN:  Soft, nontender, without hepatosplenomegaly or  mass.  LYMPHATICS:  No neck or supraclavicular nodes palpated.  EXTREMITIES:  Free of edema, cyanosis or clubbing.  She has slight  palmar erythema on the palms.  I do not see any spider angiomata.  NEUROLOGIC:  She is alert and oriented times three.  Cranial nerves II  through XII intact.  Grossly nonfocal.  PSYCHIATRIC:  Appropriate affect and mood.   I appreciate the opportunity to care for this patient.  I have reviewed  the office notes that we have.  I reviewed Dr. Cato Mulligan' notes in the EMR.  I have discussed it with Dr. Clelia Croft.     Iva Boop, MD,FACG  Electronically Signed    CEG/MedQ  DD: 01/06/2008  DT: 01/06/2008  Job #: [id number]   cc:   [address], [place] [address] Fax [phone] Dr. Rob Bunting, [facility]

## 2011-03-17 NOTE — Op Note (Signed)
[name], [name]                ACCOUNT NO.:  [id number]   MEDICAL RECORD NO.:  [id number]          PATIENT TYPE:  AMB   LOCATION:  OMED                         FACILITY:  [facility]   PHYSICIAN:  Firas N. Shadad        DATE OF BIRTH:  [birth year]   DATE OF PROCEDURE:  06/18/2008  DATE OF DISCHARGE:                               OPERATIVE REPORT   INDICATIONS FOR PROCEDURE:  Kendra Johns is a 60 year old female with a  diagnosis of multiple myeloma.  She is status post induction  chemotherapy, now she is under evaluation for high-dose chemotherapy and  stem cell transplantation.  Bone marrow is for staging purposes.   DESCRIPTION OF PROCEDURE:  Kendra Johns was brought into short stay at  [facility].  After obtaining informed consent, the patient was  placed in the decubitus position exposing her left iliac crest.  The  skin was prepped and draped in a sterile fashion using Betadine.  The  conscious sedation was obtained utilizing 5 mg of Versed, 50 mg of  Demerol.  The skin, periosteum and the subcutaneous tissue were  anesthetized using 2% lidocaine.  Using the aspirate needle, aspirate  was obtained without any difficulty and specimen was sent for flow  cytogenetics and a myeloma panel, including FISH studies.  Using the  Jamshidi needle a biopsy was obtained without any difficulty.  There was  no bleeding.  The patient tolerated the procedure well and she was  instructed to lay flat for the next 45 minutes.  Results will be faxed  to [facility] in the anticipation of her transplant.           ______________________________  Blenda Nicely. [facility]  Electronically Signed     FNS/MEDQ  D:  06/18/2008  T:  06/18/2008  Job:  [id number]

## 2011-03-17 NOTE — Op Note (Signed)
Kendra Johns, LEWIS                ACCOUNT NO.:  [id number]   MEDICAL RECORD NO.:  [id number]          PATIENT TYPE:  OUT   LOCATION:  ENDO                         FACILITY:  [facility]   PHYSICIAN:  Firas N. Shadad        DATE OF BIRTH:  1950-11-21   DATE OF PROCEDURE:  10/05/2007  DATE OF DISCHARGE:                               OPERATIVE REPORT   DESCRIPTION OF THE PROCEDURE:  This a bone marrow biopsy and aspirate.   DIAGNOSIS:  Multiple myeloma.   INDICATION:  This is a 61 year old female with multiple myeloma,  predominantly bony disease and she is status post Revlimid and  dexamethasone for four cycles and Revlimid, Velcade, dexamethasone for  two more cycles.  Bone marrow biopsy to assess response to therapy.  The  patient was brought into the short stay at [facility], placed  in the decubitus position exposing her left iliac crest.  Conscious  sedation was achieved at that time with 5 mg of Versed 50 mg of Demerol.  The skin was prepped and draped in a sterile fashion using Betadine.  Using 2% lidocaine, skin, periosteum subcutaneous tissue was  anesthetized adequately.  Using the aspirate needle, aspirate was  obtained without any difficulty.  Using the Jamshidi needle, a biopsy  was obtained without any difficulty.  The patient tolerated the  procedure well.  There was no complications or bleeding.  She was  instructed to lay flat for next 45 minutes.  No evidence of active  bleeding at this time.           ______________________________  Blenda Nicely [facility]  Electronically Signed     FNS/MEDQ  D:  10/05/2007  T:  10/05/2007  Job:  [id number]

## 2011-03-17 NOTE — Patient Instructions (Signed)
Limit your sodium (Salt) intake  Please check your blood pressure on a regular basis.  If it is consistently greater than 150/90, please make an office appointment.  Followup with Dr. Vear Clock as scheduled  Follow up with Dr. Cato Mulligan in one month

## 2011-03-20 NOTE — H&P (Signed)
NAME:  Kendra Johns, Kendra Johns                          ACCOUNT NO.:  [id number]   MEDICAL RECORD NO.:  [id number]                   PATIENT TYPE:  INP   LOCATION:  1844                                 FACILITY:  [facility]   PHYSICIAN:  Lina Sar, M.D. [facility]               DATE OF BIRTH:  04-27-1951   DATE OF ADMISSION:  08/15/2003  DATE OF DISCHARGE:                                HISTORY & PHYSICAL   CHIEF COMPLAINT:  Acute abdominal pain.   HISTORY OF PRESENT ILLNESS:  This is a pleasant 60 year old African American  woman.  She has a history of colon polyps, remote peptic ulcer disease, and  diverticulosis.  In the summary of 2003, she was treated for similar  symptoms of abdominal pain and there was some question as to whether she had  diverticulitis.  She received a course of antibiotics.  However, the CT scan  confirmed only diverticulosis, not diverticulitis.   Yesterday evening, the patient began having abdominal pain mostly in the  upper abdomen and more so on the right, although the pain became diffuse.  There was no radiation of the pain into the limbs, neck, or back.  Pain  became much worse as time passed.  She has hydrocodone at home and this  offered no relief.  She denies dizziness, fever, or sweats.  She contacted  Dr. Richarda Osmond office and was sent to the emergency room.  She has been  urinating normally, bowel movements have been normal and her last bowel  movement was yesterday.  The only new medication she has had in the last few  months is Benicar/hydrochlorothiazide for blood pressure control.  Looking  through E-chart, she has had episodes with chest pain and abdominal pain  leading to imaging studies and she had an upper GI in March of 2004, ordered  by Dr. Shana Chute showing a small hiatal hernia but no reflux.  CT scan in  2003, showed gallbladder to be intact and not diseased.  Her last  colonoscopy was in March of 2001 showing left-sided diverticulosis, internal  hemorrhoids.  Some small polyps were removed and these were not adenomatous  on the pathology.  Upper endoscopy also in March of 2001, showed gastritis,  hiatal hernia,  and an esophageal stricture which was dilated at the time.  She has also had erosive gastroduodenitis and had a positive CLOtest on  prior endoscopy and has had scarring of the duodenal bulb felt secondary to  old peptic ulcer disease on endoscopy, and this was treated with  antibiotics.  The patient does not use nonsteroidals.  She does rarely use a  coated Bayer aspirin, maybe once every couple of months for aches and pains  but no regular use of aspirin or nonsteroidal products.   ALLERGIES:  No known drug allergies.   CURRENT MEDICATIONS:  1. Benicar/hydrochlorothiazide 20/12.5 mg daily.  2. Lipitor 10 mg daily.  3. Toprol XL 50 mg daily.  4. Alprazolam1 mg at h.s. daily.  5. Hydrocodone/APAP 5/500 mg once daily and p.r.n.   PAST MEDICAL HISTORY:  1. Gastroesophageal reflux disease.  2. Diverticulosis.  3. Colon polyps.  4. Internal hemorrhoids.  5. Remote peptic ulcer disease with treatment for H. pylori, treated in     2001.  6. Status post partial abdominal hysterectomy with unilateral oophorectomy     and then later the remaining ovary was removed.  7. Degenerative joint disease with back and joint pain.  8. Status post removal of a lipoma from the left forearm about one month ago     at [facility].  9. History of chest pain of unclear etiology in March of 2004.  She had a     nuclear medicine study which was within normal limits and ruled out for     MI at that time.  10.      Hypercholesterolemia.   SOCIAL HISTORY:  [place]  She works in Water quality scientist.  She smokes  about a half a pack of cigarettes a day.  She does not drink alcohol.   FAMILY HISTORY:  Mother died secondary to leukemia.  Father died secondary   to lung cancer.  One sister died from alcoholic cirrhosis.  One sister died  with some type of cancer, she is not sure what type.   REVIEW OF SYMPTOMS:  UROLOGIC:  Urine is normal.  It does not look tea-  colored or dark or bloody.  She is not having any dysuria.  DERMATOLOGIC:  No jaundice, no rashes.  She did recently have that lipoma removed from her  forearm.  GI:  She has used Protonix in the past but she has not been using  this for a few years and has not had any GI symptoms requiring use of any  antacids.  PULMONARY:  No shortness of breath.  She does note that when she  takes a deep breath, that the pain she has is increased in the abdomen.  MUSCULOSKELETAL:  She has back and joint pains on occasion.  No joint  effusions.  Other review of systems are negative.   PHYSICAL EXAMINATION:  GENERAL APPEARANCE:  The patient is a pleasant,  healthy-looking African American female who appears to be in some distress  from pain.  VITAL SIGNS:  Blood pressure 117/89, pulse 78, respiratory rate 16,  temperature 98.6.  HEENT:  Nonicteric and conjunctivae are pink.  Extraocular movements are  intact.  Oropharynx:  Moist mucous membranes, no lesions.  Dentition is in  excellent repair.  NECK:  No masses or JVD.  CHEST:  Clear to auscultation and percussion bilaterally.  No cough.  CARDIOVASCULAR:  There is regular rate and rhythm.  No murmurs, rubs, or  gallops appreciated.  ABDOMEN:  Soft and nondistended.  Tender diffusely.  More so in the right  upper quadrant and right mid quadrant.  No guarding, no rebound.  RECTAL:  Examination was deferred as the patient was examined in the triage  area.  GU AND BREAST:  Examinations also deferred for the same reason.  EXTREMITIES:  She has a nice healing heaped up scar on the left forearm.  No  edema.  No cyanosis.  NEUROLOGIC:  There is no tremor.  Her grip is 5/5.  She is alert and  oriented x3. DERMATOLOGIC:  Do not see any jaundice.  LABORATORY DATA:  White blood cell count 9.3 with differential unremarkable  except for some increase in her absolute lymphocyte count.  Hemoglobin is  14.2, hematocrit 41.6, MCV 88.9, platelets 265,000.  Sodium 138, potassium  3.9, glucose slightly elevated at 102, BUN 7, creatinine 0.7.  Total  bilirubin 0.4, alkaline phosphatase 82.  AST 47, ALT 58, both of these  slightly elevated.  Albumin 3.8.  Lipase 33.  CK is 89, CK-MB is 1.2 and  troponin I is 0.01.  Urinalysis is clear with normal specific gravity and  negative for nitrites, leukocyte esterase, protein, ketones, etc.   Ultrasound ordered and not yet completed.   IMPRESSION:  1. Acute abdominal pain.  This is associated with slight increase in her     transaminases, so need to rule out gallbladder disease as the etiology.     Other possible etiologies include ulcer disease, gastroesophageal reflux     disease.  Does not appear to have pancreatitis as her lipase is normal.     Location of the tenderness is not consistent with diverticulitis.  2. History of hypercholesterolemia.  3. History of hypertension.  4. History of chest pain of undetermined etiology with negative Cardiolite     testing in March of 2004.   PLAN:  1. Obtain ultrasound of the abdomen.  2. Admit the patient as the pain has been refractory to outpatient narcotic     management.  The patient will need further evaluation to determine the     etiology of the pain and to control her symptoms with IV medications.  3. Clear liquid diet.  4. Follow-up ultrasound.  5. Possible surgical evaluation.      Jennye Moccasin, P.A. [facility]                   Lina Sar, M.D. [facility]    SG/MEDQ  D:  08/15/2003  T:  08/15/2003  Job:  [id number]   cc:   Valetta Mole. Swords, M.D. [facility]   Ulyess Mort, M.D. [facility]   Osvaldo Shipper. Spruill, M.D.  [address] [address]  [place]  [place] [address]  Fax: [phone]

## 2011-03-20 NOTE — Procedures (Signed)
Kendra Johns, Kendra Johns                ACCOUNT NO.:  [id number]   MEDICAL RECORD NO.:  [id number]          PATIENT TYPE:  OUT   LOCATION:  SLEEP CENTER                 FACILITY:  [facility]   PHYSICIAN:  Marcelyn Bruins, M.D. [facility] DATE OF BIRTH:  [birth year]   DATE OF STUDY:  10/09/2005                              NOCTURNAL POLYSOMNOGRAM   REFERRING PHYSICIAN:  [name] H. Swords, M.D. [facility].   DATE OF STUDY:  October 09, 2005.   INDICATION FOR STUDY:  Hypersomnia with sleep apnea.   EPWORTH SLEEPINESS SCORE:  8.   SLEEP ARCHITECTURE:  The patient had a total sleep time of 202 minutes with  significantly decreased REM and slow wave sleep. Sleep onset latency was  prolonged at 62 minutes as was REM onset at 273 minutes. Sleep efficiency  was very poor at 48%.   RESPIRATORY DATA:  The patient was found to have 18 hypopneas and 38 apneas  for a respiratory disturbance index of 17 events per hour. The events were  not positional but there was loud snoring noted.. The patient did not meet  split night protocol secondary to the small numbers of events until much  later in the night.   OXYGEN DATA:  The patient had O2 desaturation as low as 91% with her  obstructive events.   CARDIAC DATA:  No clinically significant cardiac arrhythmias.   MOVEMENT/PARASOMNIAS:  The patient only had 22 leg jerks with very little  sleep disruption. There was no abnormal behavior noted during the study.   IMPRESSION/RECOMMENDATIONS:  Mild to moderate obstructive sleep apnea with a  respiratory disturbance index of 17 events per hour and O2 desaturation as  low as 91%. Treatment for this degree of sleep apnea may include weight loss  alone if applicable, oral appliance, upper airway surgery, or C-PAP.  Clinical correlation is suggested.           ______________________________  Marcelyn Bruins, M.D. [facility]  Diplomate, American Board of Sleep  Medicine    KC/MEDQ  D:  10/21/2005 11:39:15  T:  10/21/2005 22:46:09  Job:   [id number]

## 2011-03-20 NOTE — Op Note (Signed)
Kendra Johns, Kendra Johns                ACCOUNT NO.:  [id number]   MEDICAL RECORD NO.:  [id number]          PATIENT TYPE:  AMB   LOCATION:  SDS                          FACILITY:  [facility]   PHYSICIAN:  Cristi Loron, M.D.DATE OF BIRTH:  20-Feb-1951   DATE OF PROCEDURE:  11/17/2006  DATE OF DISCHARGE:  11/17/2006                               OPERATIVE REPORT   BRIEF HISTORY:  The patient is a 60 year old black female who is  suffering from back pain.  She was worked up with a lumbar MRI, which  demonstrated an L5 lesion.  She underwent 2 needle percutaneous biopsies  of this lesion, which were inconclusive.  She has seen an oncologist and  had blood work, which was also inconclusive.  She has had other imaging  studies, which were unable to find a diagnosis.  I was asked by the  medical team to do an open biopsy.  I discussed this procedure with the  patient.  She weighed the risks, benefits, and alternative surgery, and  decided to proceed with the open transpedicular biopsy.   PREOPERATIVE DIAGNOSIS:  L5 lesion.   POSTOPERATIVE DIAGNOSIS:  L5 lesion.   PROCEDURE:  Open right L5 transradicular vertebral body biopsy.   SURGEON:  Cristi Loron, M.D.   ASSISTANT:  None.   ANESTHESIA:  General endotracheal.   ESTIMATED BLOOD LOSS:  Minimal.   SPECIMENS:  Tumor.   DRAINS:  None.   COMPLICATIONS:  None.   DESCRIPTION OF PROCEDURE:  The patient was brought to the operating room  by Anesthesia team.  General endotracheal anesthesia was induced.  The  patient was turned to the prone position on the Wilson frame.  Her  lumbosacral region was then prepared with Betadine scrub and Betadine  solution.  Sterile drapes were applied.  I then injected the area to be  incised with Marcaine with epinephrine solution, using a scalpel to make  a linear midline incision over the L5 pedicle.  I used electrocautery to  perform a right-sided subperiosteal dissection, exposing the right  spinous process of lamina and facet at L4-5.  We then inserted the nerve  retractor for exposure.  Under fluoroscopic guidance, I cannulated the  right L5 pedicle with a bone probe.  I then tapped the pedicle with a  4.5-mm tap in order to get better access to the vertebral body, so that  I could obtain several specimens with the micropituitary forceps.  I  then removed the tap and then under fluoroscopic guidance I inserted the  micropituitary forceps through the right L5 pedicle into the right L5  vertebral body.  I obtained 3 specimens and they came back clearly  abnormal tissue.  We sent this off to pathology for permanent sections.  I then placed a small piece of Gelfoam in the pedicle.  There was  minimal bleeding.  We then obtained hemostasis using electrocautery and  irrigated the wound out with bacitracin solution, removed the Mccullogh  retractor and then reapproximated the patient's thoracolumbar fascia  with interrupted #1 Vicryl suture, the subcutaneous tissue with  interrupted 2-0 Vicryl  suture, and the skin with Steri-Strips and  benzoin.  The wound was then coated with bacitracin ointment.  Sterile  dressing was applied.  The drapes were removed and the patient was  subsequently returned to the supine position where she was extubated by  the Anesthesia team and transported to the Postanesthesia Care Unit in  stable condition.  All sponge, instrument, and needle counts were  correct at the end of this case.      Cristi Loron, M.D.  Electronically Signed     JDJ/MEDQ  D:  11/17/2006  T:  11/18/2006  Job:  [id number]

## 2011-03-20 NOTE — Discharge Summary (Signed)
   NAME:  Kendra Johns, Kendra Johns                          ACCOUNT NO.:  [id number]   MEDICAL RECORD NO.:  [id number]                   PATIENT TYPE:  INP   LOCATION:  6527                                 FACILITY:  [facility]   PHYSICIAN:  Osvaldo Shipper. Spruill, M.D.             DATE OF BIRTH:  Feb 21, 1951   DATE OF ADMISSION:  01/15/2003  DATE OF DISCHARGE:  01/16/2003                                 DISCHARGE SUMMARY   DISCHARGE DIAGNOSES:  1. Angina.  2. Hypertension.  3. Tobacco abuse disorder.   BRIEF HISTORY AND HOSPITAL COURSE:  The patient is a 60 year old female who  was seen initially in the emergency department at [facility] with a  complaint of chest pain.  The patient gave a history that she had been  having some chest pain with dizziness.  It is of note that the patient had a  similar episode on 01/12/2003 seen in the emergency department still having  problems with chest pain and presented again for this evaluation.  The  patient was subsequently admitted for further evaluation of this particular  problem.   The patient had electrolytes done which were well within normal limits,  except for a glucose of 153.  Serial cardiac enzymes were negative for acute  findings, the troponins were less than 0.01.  A lipid profile however, did  reveal a slight elevation of 206, the triglycerides were normal at 75, the  HDL cholesterol was normal at 51, the LDL cholesterol however, was high at  140.  The serial electrocardiograms revealed a normal sinus rhythm at 87  beats per minute.  The patient also underwent a Persantine-Cardiolite study  which was read as a normal study without evidence of pharmacological-induced  myocardial ischemia.  The ventricular ejection fraction was 69%.   After it was felt that myocardial infarction had been ruled out, it was the  opinion that the patient could be discharged home and continued workup  completed as an outpatient.  The patient was notified to  come by the office  for arrangement of outpatient office followup and for review of medications.  The patient is to notify the physician immediately if any changes, problems,  or concerns.     Ivery Quale, P.A.                       Osvaldo Shipper. Spruill, M.D.    HB/MEDQ  D:  03/14/2003  T:  03/15/2003  Job:  [id number]

## 2011-03-20 NOTE — Discharge Summary (Signed)
NAME:  Kendra Johns, Kendra Johns                          ACCOUNT NO.:  [id number]   MEDICAL RECORD NO.:  [id number]                   PATIENT TYPE:  INP   LOCATION:  5709                                 FACILITY:  [facility]   PHYSICIAN:  Lina Sar, M.D. [facility]               DATE OF BIRTH:  06/18/1951   DATE OF ADMISSION:  08/15/2003  DATE OF DISCHARGE:  08/16/2003                                 DISCHARGE SUMMARY   ADMITTING DIAGNOSES:  1. Acute abdominal pain associated with slight increase in her     transaminases, rule out gallbladder disease.  Lipase is normal, so     pancreatitis is less likely.  Rule out diverticulitis, though location of     tenderness in the upper abdomen is not consistent with typical     diverticular location of pain.  Rule out peptic ulcer disease, rule out     GERD.  2. History of hypercholesterolemia.  3. History of hypertension.  4. History of chest pain of undetermined etiology with negative Cardiolite     testing, March 2004.  5. Gastroesophageal reflux disease.  Upper GI ordered by Dr. Shana Chute in     March 2004, shows hiatal hernia without reflux.  Upper endoscopy March     2001, showed gastritis, hiatal hernia, and esophageal stricture dilated     at that time.  Previous EGD showed erosive gastroduodenitis and a CLO-     positive test which treated.  Scarring in the duodenal bulb felt     secondary to peptic ulcer disease as well.  6. History of diverticulosis and colon polyps.  The latest colonoscopy in     March 2001, showing left-sided diverticulosis, internal hemorrhoids, and     nonadenomatous small colon polyps.  7. History of unexplained chest and abdominal pain.  8. Status post partial abdominal hysterectomy with unilateral oophorectomy,     status post subsequent removal of the remaining ovary.  9. Degenerative joint disease.  10.      Status post removal of left forearm lipoma, in September 2004, at     [facility].   DISCHARGE DIAGNOSES:  1. Upper abdominal pain, upper endoscopy this admission showing an     esophageal stricture which was dilated to 48 [place], chronic and acute     peptic ulcer disease involving the duodenum.  Biopsy for CLO testing was     negative for Helicobacter.  Antral gastric erosions and a hiatal hernia     also noted.  2. Not available is slight transaminitis, hepatitis B core antibody     positive, and hepatitis C antibody positive.  Hepatitis B surface     antigen, hepatitis B surface antibody negative.  Ultrasound showed no     cholelithiasis or biliary dilation, but a possible prominent borderline     peripancreatic lymph node in the soft tissue in the region of the  pancreatic head.   BRIEF HISTORY:  The patient is a pleasant 60 year old [place] American  female with the above-mentioned past medical history.  On the evening prior  to admission, she developed diffuse, but predominantly upper abdominal pain.  There was no radiation of the pain, it worsened on the morning of admission  and was not relieved by hydrocodone.  She was sent by Dr. Blossom Hoops office  to the emergency room.  Bowel movements have been within normal limits.  Urine habits also unchanged.  She was evaluated and admitted by Dr. Lina Sar on October 13, from the emergency room.  The patient had not been  using any regular nonsteroidals and rarely used a coated Bayer aspirin once  every couple of months.  Medications were all chronic, except for having  started on a Benicar/hydrochlorothiazide combination medication three or  four months previously.  She was not using any GI protective medications  despite her history of recent reflux and ulcer disease.   CONSULTATIONS:  None.   PROCEDURES:  Upper endoscopy, findings described above.   LABORATORY DATA:  White blood cell count 9.3, hemoglobin 14.2, hematocrit  41.6, MCV 88.9, platelets 265,000.  Chemistries notable for normal BUN and  creatinine at 7 and 0.7, glucose  102.  Total bilirubin 0.4, alkaline  phosphatase 82, AST 47, ALT 58, lipase 33.  CK 89, CK-MB 1.2, troponin I  0.01.  Hepatitis B surface antigen negative.  Hepatitis B core antibody  positive.  Hepatitis B E antigen negative.  Hepatitis B surface antibody  negative.  Hepatitis C antibody positive.  Urinalysis negative.  Helicobacter testing was negative.   IMAGING STUDIES:  Ultrasound of the abdomen, described above, acute  abdominal series showed a normal bowel gas pattern and no acute or cardiac  process, although there was some chronic pulmonary interstitial changes.   HOSPITAL COURSE:  The patient was admitted in stable condition.  She was  started on clears.  Ultrasound was obtained and was unremarkable as to the  source of her abdominal pain.  CT scan was not pursued.  Demerol with  Phenergan controlled her GI symptoms fairly well.  She was started on oral  Protonix.  The patient remained stable overnight and underwent upper  endoscopy the following morning.  Results are described above.  Dr. Juanda Chance  added Protonix 40 mg twice daily to her medical regimen.  She was advised to  stop smoking.  Prescription for p.r.n. Levsin sublingual was also provided  for any recurrence of abdominal pain.  She was advised to follow up with Dr.  Terrial Rhodes in 2-3 weeks and his phone number was provided to the  patient.  She was advised also to avoid any p.r.n. use of aspirin or Advil.  Limited prescription for Vicodin was added p.r.n. for severe pain, this, in  addition to all of her inpatient medications.   In reviewing this patient's chart for discharge dictation, it has come to  our awareness that she is hepatitis B and C positive.  We will plan to  follow this up as an outpatient as she may qualify for treatment of the  hepatitis C and should be referred, if an appropriate candidate to the  hepatitis C clinic in town.   DISCHARGE MEDICATIONS: 1. Benicar/hydrochlorothiazide 20/12.5 mg  once daily.  2. Lipitor 10 mg once daily.  3. Toprol XL 50 mg once daily.  4. Alprazolam 1 mg at h.s.  5. Hydrocodone/APAP 5/500 approximately once daily.  6. Protonix  40 mg twice daily for one week, then decrease to once daily.  7. Levsin sublingual 0.125 mg 1 sublingually as needed for abdominal     cramping.   DIET:  Low fat as before.   CONDITION ON DISCHARGE:  Stable and improved.      Jennye Moccasin, P.A. [facility]                   Lina Sar, M.D. [facility]    SG/MEDQ  D:  08/27/2003  T:  08/27/2003  Job:  [id number]   cc:   Valetta Mole. Swords, M.D. [facility]   Ulyess Mort, M.D. [facility]

## 2011-03-20 NOTE — Op Note (Signed)
NAME:  Kendra Johns, Kendra Johns                          ACCOUNT NO.:  [id number]   MEDICAL RECORD NO.:  [id number]                   PATIENT TYPE:  INP   LOCATION:  5709                                 FACILITY:  [facility]   PHYSICIAN:  Lina Sar, M.D. [facility]               DATE OF BIRTH:  [birth year]   DATE OF PROCEDURE:  08/16/2003  DATE OF DISCHARGE:                                 OPERATIVE REPORT   PROCEDURE:  Upper endoscopy.   SURGEON:  Lina Sar, M.D.   INDICATIONS FOR PROCEDURE:  This 60 year old African American female was  admitted yesterday with severe epigastric pain.  She has mild elevation of  liver function test and history of hepatitis B exposure.  Ultrasound of the  right upper quadrant did not show any gallstones.  She is a smoker, has a  history of peptic ulcer disease of the duodenum and underwent previous upper  endoscopies by Dr. Corinda Gubler with the last one about three years ago.  Her  stool was Hemoccult negative.  Hemoglobin was normal.  She is undergoing  upper endoscopy because of persistent epigastric pains this morning.   ENDOSCOPE:  Fujinon single channel video endoscope.   SEDATION:  Versed 7.5 mg IV, Fentanyl 75 mcg IV.   FINDINGS:  Fujinon single channel video endoscope passed under direct vision  through posterior pharynx into the esophagus.  The patient was monitored by  pulse oximeter.  Her oxygen saturations were normal.  Proximal and mid  esophageal mucosa was normal.  At the level of 35 cm from the incisors was a  fibrous ring consistent with mild esophageal stricture.  There was one short  erosion within the stricture which measured about 2 mm and was consistent  with mild reflux esophagitis.  Endoscope traversed through the stricture  without resistance.  Distal to the stricture was an easily reducible hiatal  hernia which initially measured 3 to 4 cm but was completely reduced by  passage of the scope into the stomach.   STOMACH:  Stomach was  insufflated with air.  Body of the stomach was normal.  There were multiple pinpoint erosions in the gastric antrum.  This was  circular, some of them covered with exudate.  Pyloric outline itself was  normal.  Retroflexion of endoscope revealed normal sinus and cardia.   DUODENUM:  Duodenal bulb was deformed showing large folds.  Multiple  erosions and mucosal abrasions were noted.  Also, small superficial  ulceration on the top of the bulb.  __________ on the duodenal bulb was  normal.  Biopsies were taken for CLOtest.  There was some bleeding from  these lesions as the endoscope traversed through.  The descending duodenum  was normal.  Endoscope was then brought back into the stomach.  A guidewire  was placed into the stomach and two Savary dilators passed over the  guidewire without fluoroscopic guidance using 14 mm and  16 mm dilators.  There was no blood on the dilator.  The patient tolerated the procedure  well.   IMPRESSION:  1. Chronic and acute peptic ulcer disease of the duodenal bulb, status post     CLOtest.  2. Antral gastritis.  3. Mild esophageal stricture, status post dilatation 40 [place].  4. Reducible hiatal hernia.   PLAN:  The patient will be able to be discharged today on high dose proton  pump inhibitor, Protonix 40 b.i.d., Levsin sublingually 0.125 mg p.r.n.,  Vicodin p.r.n. abdominal pain, and instructions to stop smoking.  She is to  follow up with Dr. Corinda Gubler in the office.                                               Lina Sar, M.D. [facility]    DB/MEDQ  D:  08/16/2003  T:  08/16/2003  Job:  [id number]   cc:   Ulyess Mort, M.D. [facility]

## 2011-03-20 NOTE — Op Note (Signed)
NAMEFREDDYE, Kendra Johns                ACCOUNT NO.:  [id number]   MEDICAL RECORD NO.:  [id number]          PATIENT TYPE:  AMB   LOCATION:  OMED                         FACILITY:  [facility]   PHYSICIAN:  Firas N. 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  Bone marrow biopsies to rule out multiple  myeloma.   DESCRIPTION OF PROCEDURE:  The patient was brought into short stay, IV  access was obtained and conscious sedation was obtained utilizing 4 mg  of Versed, 25 mg of Demerol, the patient was put placed in the decubitus  position.  Skin was prepped and draped in a sterile fashion using  Betadine.  The skin, subcutaneous tissue and periosteum was anesthetized  using 2% lidocaine.  Using the aspirate needle, aspirate was obtained  without any difficulty and was sent for flow and cytogenetics.  The  Jamshidi needle used to obtain biopsy without any difficulty.  There was  no complication.  The patient did experience some mild hives of her  upper extremity felt to maybe related to Demerol.  Her vitals were  stable all throughout and her hives subsided by the time the procedure  was done.  There was minimal blood loss.  The patient was instructed to  lie flat.  She was also given prescription for Vicodin, given her  chronic back pain to help control her pain from this particular  procedure.  She will follow-up with me in 5 days to discuss the results           ______________________________  Blenda Nicely. [facility]  Electronically Signed     FNS/MEDQ  D:  10/25/2006  T:  10/25/2006  Job:  [id number]

## 2011-04-06 ENCOUNTER — Encounter: Payer: Self-pay | Admitting: Internal Medicine

## 2011-04-06 ENCOUNTER — Ambulatory Visit (INDEPENDENT_AMBULATORY_CARE_PROVIDER_SITE_OTHER): Payer: PRIVATE HEALTH INSURANCE | Admitting: Internal Medicine

## 2011-04-06 VITALS — BP 164/100 | Temp 98.8°F | Ht 64.0 in | Wt 122.0 lb

## 2011-04-06 DIAGNOSIS — B182 Chronic viral hepatitis C: Secondary | ICD-10-CM

## 2011-04-06 DIAGNOSIS — C9000 Multiple myeloma not having achieved remission: Secondary | ICD-10-CM

## 2011-04-06 DIAGNOSIS — M545 Low back pain: Secondary | ICD-10-CM

## 2011-04-06 MED ORDER — FENTANYL 50 MCG/HR TD PT72: 1.0000 | MEDICATED_PATCH | TRANSDERMAL | Status: DC

## 2011-04-06 NOTE — Assessment & Plan Note (Addendum)
She sees pain physician (dr Vear Clock).  Uses oxycodone  She has MM---has not seen oncologist. Has appt with dr. Gaylyn Rong later this month (she has been in the process of changing oncologists)  Trial fentanyl patch- 50 mcg---I will clear with Dr. Vear Clock

## 2011-04-07 NOTE — Assessment & Plan Note (Signed)
I suspect most of her pain is due to progressive disease. Reviewed imaging studies. I'm concerned that she has enlarging lytic lesions. She has not followed up with her [facility] oncologist or local oncologist in quite some time. She understands that she needs to be compliant with oncology followup. She will see Dr. Gaylyn Rong later this month. I called Dr. Lodema Pilot office to request earlier appointment.

## 2011-04-07 NOTE — Assessment & Plan Note (Signed)
genotype 1 with positive RNA titers as recent as March 2009, prior to stem cell transplant. HBV DNA was negative at that time  She is immune to HBV (core Ab +) S Ag and DNA negative

## 2011-04-07 NOTE — Progress Notes (Signed)
Subjective:    Patient ID: Kendra Johns, female    DOB: 1951-06-02, 60 y.o.   MRN: [id number]  HPI  Complicated patient with plasmacytoma, multiple myeloma, history of bone marrow transplantation, hepatitis C, chronic pain syndrome, hypertension. She comes in for evaluation if they've mostly due to continued low back pain. She has seen Dr. Vear Clock and has tried various narcotic regimens without significant relief. I contacted Dr. Vear Clock office today there is some concern that [name] is not using narcotics as prescribed. Patient's pain is mostly located in her back. No previous history of compression fracture status post kyphoplasty. I have reviewed imaging studies. It appears to me that radiology imaging studies demonstrate progressive lytic lesions of her hips and back. Patient has been noncompliant with followup with oncology. She is requesting a new oncologist and I have previously referred her to Dr. Gaylyn Rong. She has an appointment scheduled with him later this month.  In regards to her pain the patient reports chronic pain in her lower back. Pain is intensified with certain movements. Occasional pain will radiate down her legs to her knees. I reviewed most recent MRI scan. The results were reviewed with the patient.  Past Medical History  Diagnosis Date  . Depression   . Hypertension   . Hepatitis C     genotype 1  . Diverticulosis of colon   . Multiple myeloma     kappa light chain, s/p high-dose chemo/stem cell tx - [facility]  . Hx of adenomatous colonic polyps 2007  . Anxiety   . Osteoarthritis   . Sleep apnea   . Hyperlipidemia   . GERD with stricture   . External hemorrhoids   . Hepatitis B virus infection   . IBS (irritable bowel syndrome)   . Helicobacter pylori gastritis 2001    ? if treated   Past Surgical History  Procedure Date  . Abdominal hysterectomy   . Oophorectomy   . Dilation and curettage of uterus   . Bone marrow transplant   . Appendectomy   . Colonoscopy  w/ polypectomy 08/2006    1-2 adenomas, 6 polyps total, diverticulosis and external hemorrhoids  . Upper gastrointestinal endoscopy 08/2003    esophageal stricture dilation, hiatal hernia, gastrritis  . Cholecystectomy   . Tonsillectomy and adenoidectomy     reports that she has been smoking Cigarettes.  She has a 12 pack-year smoking history. She does not have any smokeless tobacco history on file. She reports that she does not drink alcohol or use illicit drugs. family history includes Alcohol abuse in an unspecified family member; Cirrhosis in her sister; Leukemia in her mother; and Lung cancer in her father.  There is no history of Colon cancer. Allergies  Allergen Reactions  . Codeine   . Ibuprofen     REACTION: gi     Review of Systems    patient denies chest pain, shortness of breath, orthopnea. Denies lower extremity edema, abdominal pain, change in appetite, change in bowel movements. Patient denies rashes, musculoskeletal complaints. No other specific complaints in a complete review of systems.      Objective:   Physical Exam Chronically ill-appearing female in no acute distress. HEENT exam atraumatic, normocephalic, extraocular muscles are intact. Neck is supple. No jugular venous distention no thyromegaly. Chest clear to auscultation without increased work of breathing. Cardiac exam S1 and S2 are regular. Abdominal exam active bowel sounds, soft, nontender. Extremities no edema. Neurologic exam she is alert without any motor sensory deficits. Gait  is normal.     Current meds: Current outpatient prescriptions:amitriptyline (ELAVIL) 25 MG tablet, Take 12.5 mg by mouth at bedtime.  , Disp: , Rfl: ;  Bisacodyl (LAXATIVE PO), Take by mouth. As needed , Disp: , Rfl: ;  diazepam (VALIUM) 5 MG tablet, Take 5 mg by mouth as needed.  , Disp: , Rfl: ;  lisinopril (PRINIVIL,ZESTRIL) 20 MG tablet, Take 1 tablet (20 mg total) by mouth daily., Disp: 90 tablet, Rfl: 11 oxyCODONE (ROXICODONE)  15 MG immediate release tablet, Take 15 mg by mouth 2 (two) times daily. , Disp: , Rfl: ;  Sodium Phosphates (ENEMA RE), Place rectally. As needed , Disp: , Rfl: ;  DISCONTD: amitriptyline (ELAVIL) 25 MG tablet, Take 1 tablet (25 mg total) by mouth at bedtime., Disp: 30 tablet, Rfl: 3;  fentaNYL (DURAGESIC) 50 MCG/HR, Place 1 patch (50 mcg total) onto the skin every 3 (three) days., Disp: 10 patch, Rfl: 3  Assessment & Plan:

## 2011-04-08 ENCOUNTER — Telehealth: Payer: Self-pay | Admitting: Internal Medicine

## 2011-04-08 ENCOUNTER — Other Ambulatory Visit: Payer: Self-pay | Admitting: *Deleted

## 2011-04-08 MED ORDER — DIAZEPAM 5 MG PO TABS: 5.0000 mg | ORAL_TABLET | ORAL | Status: DC | PRN

## 2011-04-08 NOTE — Telephone Encounter (Signed)
Triage vm--------was seen on Monday and either Valium or Diazepam was going to be called in to [facility]. Please advise.

## 2011-04-08 NOTE — Telephone Encounter (Signed)
rx called in

## 2011-04-09 ENCOUNTER — Encounter (HOSPITAL_BASED_OUTPATIENT_CLINIC_OR_DEPARTMENT_OTHER): Payer: PRIVATE HEALTH INSURANCE | Admitting: Oncology

## 2011-04-09 ENCOUNTER — Other Ambulatory Visit: Payer: Self-pay | Admitting: Oncology

## 2011-04-09 DIAGNOSIS — G894 Chronic pain syndrome: Secondary | ICD-10-CM

## 2011-04-09 DIAGNOSIS — C9000 Multiple myeloma not having achieved remission: Secondary | ICD-10-CM

## 2011-04-09 LAB — CBC WITH DIFFERENTIAL/PLATELET
BASO%: 0.5 % (ref 0.0–2.0)
Basophils Absolute: 0 10*3/uL (ref 0.0–0.1)
EOS%: 1.8 % (ref 0.0–7.0)
HGB: 15.6 g/dL (ref 11.6–15.9)
MCH: 29.8 pg (ref 25.1–34.0)
MCHC: 34.2 g/dL (ref 31.5–36.0)
MCV: 87 fL (ref 79.5–101.0)
MONO%: 5.5 % (ref 0.0–14.0)
RDW: 13.8 % (ref 11.2–14.5)

## 2011-04-13 ENCOUNTER — Other Ambulatory Visit: Payer: Self-pay | Admitting: Oncology

## 2011-04-13 ENCOUNTER — Telehealth: Payer: Self-pay | Admitting: *Deleted

## 2011-04-13 LAB — COMPREHENSIVE METABOLIC PANEL
AST: 23 U/L (ref 0–37)
Albumin: 4.7 g/dL (ref 3.5–5.2)
BUN: 13 mg/dL (ref 6–23)
Calcium: 10 mg/dL (ref 8.4–10.5)
Chloride: 105 mEq/L (ref 96–112)
Glucose, Bld: 97 mg/dL (ref 70–99)
Potassium: 4 mEq/L (ref 3.5–5.3)

## 2011-04-13 LAB — PROTEIN ELECTROPHORESIS, SERUM
Alpha-1-Globulin: 4.5 % (ref 2.9–4.9)
Gamma Globulin: 17.3 % (ref 11.1–18.8)

## 2011-04-13 LAB — KAPPA/LAMBDA LIGHT CHAINS: Kappa free light chain: 17.6 mg/dL — ABNORMAL HIGH (ref 0.33–1.94)

## 2011-04-13 NOTE — Telephone Encounter (Signed)
Pt needs a refill on Opand ER 15mg  (?). Pt states that pain management states it's okay to do it.

## 2011-04-14 ENCOUNTER — Other Ambulatory Visit: Payer: Self-pay | Admitting: Oncology

## 2011-04-14 ENCOUNTER — Encounter (HOSPITAL_BASED_OUTPATIENT_CLINIC_OR_DEPARTMENT_OTHER): Payer: Medicare Other | Admitting: Oncology

## 2011-04-14 ENCOUNTER — Other Ambulatory Visit (HOSPITAL_COMMUNITY)
Admission: RE | Admit: 2011-04-14 | Discharge: 2011-04-14 | Disposition: A | Discharge status: Home / Self Care | Payer: PRIVATE HEALTH INSURANCE | Source: Ambulatory Visit | Attending: Oncology | Admitting: Oncology

## 2011-04-14 DIAGNOSIS — Z0389 Encounter for observation for other suspected diseases and conditions ruled out: Secondary | ICD-10-CM | POA: Insufficient documentation

## 2011-04-14 DIAGNOSIS — C9000 Multiple myeloma not having achieved remission: Secondary | ICD-10-CM

## 2011-04-15 LAB — UIFE/LIGHT CHAINS/TP QN, 24-HR UR
Albumin, U: DETECTED
Alpha 1, Urine: DETECTED — AB
Alpha 2, Urine: DETECTED — AB
Beta, Urine: DETECTED — AB
Gamma Globulin, Urine: DETECTED — AB
Volume, Urine: 550 mL

## 2011-04-23 ENCOUNTER — Encounter (HOSPITAL_BASED_OUTPATIENT_CLINIC_OR_DEPARTMENT_OTHER): Payer: PRIVATE HEALTH INSURANCE | Admitting: Oncology

## 2011-04-23 DIAGNOSIS — G894 Chronic pain syndrome: Secondary | ICD-10-CM

## 2011-04-23 DIAGNOSIS — C9002 Multiple myeloma in relapse: Secondary | ICD-10-CM

## 2011-04-29 ENCOUNTER — Ambulatory Visit
Admission: RE | Admit: 2011-04-29 | Discharge: 2011-04-29 | Disposition: A | Discharge status: Home / Self Care | Payer: PRIVATE HEALTH INSURANCE | Source: Ambulatory Visit | Attending: Radiation Oncology | Admitting: Radiation Oncology

## 2011-04-29 DIAGNOSIS — E785 Hyperlipidemia, unspecified: Secondary | ICD-10-CM | POA: Insufficient documentation

## 2011-04-29 DIAGNOSIS — Z801 Family history of malignant neoplasm of trachea, bronchus and lung: Secondary | ICD-10-CM | POA: Insufficient documentation

## 2011-04-29 DIAGNOSIS — Z806 Family history of leukemia: Secondary | ICD-10-CM | POA: Insufficient documentation

## 2011-04-29 DIAGNOSIS — Z79899 Other long term (current) drug therapy: Secondary | ICD-10-CM | POA: Insufficient documentation

## 2011-04-29 DIAGNOSIS — C9000 Multiple myeloma not having achieved remission: Secondary | ICD-10-CM | POA: Insufficient documentation

## 2011-04-29 DIAGNOSIS — F17200 Nicotine dependence, unspecified, uncomplicated: Secondary | ICD-10-CM | POA: Insufficient documentation

## 2011-04-29 DIAGNOSIS — Z9484 Stem cells transplant status: Secondary | ICD-10-CM | POA: Insufficient documentation

## 2011-04-29 DIAGNOSIS — Z809 Family history of malignant neoplasm, unspecified: Secondary | ICD-10-CM | POA: Insufficient documentation

## 2011-05-05 ENCOUNTER — Other Ambulatory Visit: Payer: Self-pay | Admitting: Oncology

## 2011-05-05 ENCOUNTER — Encounter (HOSPITAL_BASED_OUTPATIENT_CLINIC_OR_DEPARTMENT_OTHER): Payer: PRIVATE HEALTH INSURANCE | Admitting: Oncology

## 2011-05-05 DIAGNOSIS — C9002 Multiple myeloma in relapse: Secondary | ICD-10-CM

## 2011-05-05 DIAGNOSIS — C9000 Multiple myeloma not having achieved remission: Secondary | ICD-10-CM

## 2011-05-05 DIAGNOSIS — Z5111 Encounter for antineoplastic chemotherapy: Secondary | ICD-10-CM

## 2011-05-05 DIAGNOSIS — G894 Chronic pain syndrome: Secondary | ICD-10-CM

## 2011-05-05 LAB — CBC WITH DIFFERENTIAL/PLATELET
BASO%: 0.6 % (ref 0.0–2.0)
EOS%: 1.5 % (ref 0.0–7.0)
MCH: 30.1 pg (ref 25.1–34.0)
MCHC: 34 g/dL (ref 31.5–36.0)
MCV: 88.6 fL (ref 79.5–101.0)
MONO%: 6.9 % (ref 0.0–14.0)
RDW: 14.1 % (ref 11.2–14.5)
lymph#: 3.1 10*3/uL (ref 0.9–3.3)

## 2011-05-07 LAB — KAPPA/LAMBDA LIGHT CHAINS
Kappa free light chain: 44.9 mg/dL — ABNORMAL HIGH (ref 0.33–1.94)
Kappa:Lambda Ratio: 29.74 — ABNORMAL HIGH (ref 0.26–1.65)
Lambda Free Lght Chn: 1.51 mg/dL (ref 0.57–2.63)

## 2011-05-07 LAB — COMPREHENSIVE METABOLIC PANEL
BUN: 13 mg/dL (ref 6–23)
CO2: 24 mEq/L (ref 19–32)
Calcium: 9.7 mg/dL (ref 8.4–10.5)
Chloride: 103 mEq/L (ref 96–112)
Creatinine, Ser: 0.81 mg/dL (ref 0.50–1.10)
Glucose, Bld: 86 mg/dL (ref 70–99)
Total Bilirubin: 0.4 mg/dL (ref 0.3–1.2)

## 2011-05-07 LAB — MAGNESIUM: Magnesium: 2.3 mg/dL (ref 1.5–2.5)

## 2011-05-08 ENCOUNTER — Other Ambulatory Visit: Payer: Self-pay | Admitting: Oncology

## 2011-05-08 ENCOUNTER — Encounter (HOSPITAL_BASED_OUTPATIENT_CLINIC_OR_DEPARTMENT_OTHER): Payer: Medicare Other | Admitting: Oncology

## 2011-05-08 DIAGNOSIS — I10 Essential (primary) hypertension: Secondary | ICD-10-CM

## 2011-05-08 DIAGNOSIS — Z5111 Encounter for antineoplastic chemotherapy: Secondary | ICD-10-CM

## 2011-05-08 DIAGNOSIS — G894 Chronic pain syndrome: Secondary | ICD-10-CM

## 2011-05-08 DIAGNOSIS — C9000 Multiple myeloma not having achieved remission: Secondary | ICD-10-CM

## 2011-05-08 DIAGNOSIS — C9002 Multiple myeloma in relapse: Secondary | ICD-10-CM

## 2011-05-08 LAB — CBC WITH DIFFERENTIAL/PLATELET
BASO%: 0.4 % (ref 0.0–2.0)
EOS%: 0.1 % (ref 0.0–7.0)
HCT: 41 % (ref 34.8–46.6)
MCH: 30.7 pg (ref 25.1–34.0)
MCHC: 34.3 g/dL (ref 31.5–36.0)
MONO#: 0.5 10*3/uL (ref 0.1–0.9)
NEUT%: 74.7 % (ref 38.4–76.8)
RBC: 4.59 10*6/uL (ref 3.70–5.45)
WBC: 9.3 10*3/uL (ref 3.9–10.3)
lymph#: 1.8 10*3/uL (ref 0.9–3.3)

## 2011-05-11 ENCOUNTER — Other Ambulatory Visit: Payer: Self-pay | Admitting: Oncology

## 2011-05-11 ENCOUNTER — Encounter (HOSPITAL_BASED_OUTPATIENT_CLINIC_OR_DEPARTMENT_OTHER): Payer: Medicare Other | Admitting: Oncology

## 2011-05-11 DIAGNOSIS — Z5111 Encounter for antineoplastic chemotherapy: Secondary | ICD-10-CM

## 2011-05-11 DIAGNOSIS — M545 Low back pain: Secondary | ICD-10-CM

## 2011-05-11 DIAGNOSIS — G894 Chronic pain syndrome: Secondary | ICD-10-CM

## 2011-05-11 DIAGNOSIS — K5900 Constipation, unspecified: Secondary | ICD-10-CM

## 2011-05-11 DIAGNOSIS — C9000 Multiple myeloma not having achieved remission: Secondary | ICD-10-CM

## 2011-05-11 DIAGNOSIS — C9002 Multiple myeloma in relapse: Secondary | ICD-10-CM

## 2011-05-11 LAB — CBC WITH DIFFERENTIAL/PLATELET
BASO%: 0.4 % (ref 0.0–2.0)
Basophils Absolute: 0 10*3/uL (ref 0.0–0.1)
EOS%: 2 % (ref 0.0–7.0)
HCT: 40 % (ref 34.8–46.6)
HGB: 13.7 g/dL (ref 11.6–15.9)
MCH: 30.6 pg (ref 25.1–34.0)
MCHC: 34.3 g/dL (ref 31.5–36.0)
MONO#: 0.4 10*3/uL (ref 0.1–0.9)
RDW: 14.2 % (ref 11.2–14.5)
WBC: 6.2 10*3/uL (ref 3.9–10.3)
lymph#: 2.5 10*3/uL (ref 0.9–3.3)

## 2011-05-12 LAB — COMPREHENSIVE METABOLIC PANEL
AST: 22 U/L (ref 0–37)
Albumin: 4.4 g/dL (ref 3.5–5.2)
Alkaline Phosphatase: 64 U/L (ref 39–117)
BUN: 16 mg/dL (ref 6–23)
Creatinine, Ser: 0.83 mg/dL (ref 0.50–1.10)
Potassium: 4.1 mEq/L (ref 3.5–5.3)
Total Bilirubin: 0.4 mg/dL (ref 0.3–1.2)

## 2011-05-12 LAB — BASIC METABOLIC PANEL
BUN: 15 mg/dL (ref 6–23)
Chloride: 104 mEq/L (ref 96–112)
Creatinine, Ser: 0.86 mg/dL (ref 0.50–1.10)
Glucose, Bld: 146 mg/dL — ABNORMAL HIGH (ref 70–99)

## 2011-05-12 LAB — SPEP & IFE WITH QIG
Albumin ELP: 57.4 % (ref 55.8–66.1)
Alpha-1-Globulin: 4.9 % (ref 2.9–4.9)
Beta 2: 2.9 % — ABNORMAL LOW (ref 3.2–6.5)
IgA: 79 mg/dL (ref 68–380)
IgM, Serum: 107 mg/dL (ref 52–322)
Total Protein, Serum Electrophoresis: 7.3 g/dL (ref 6.0–8.3)

## 2011-05-12 LAB — KAPPA/LAMBDA LIGHT CHAINS: Lambda Free Lght Chn: 0.69 mg/dL (ref 0.57–2.63)

## 2011-05-14 ENCOUNTER — Encounter (HOSPITAL_BASED_OUTPATIENT_CLINIC_OR_DEPARTMENT_OTHER): Payer: Medicare Other | Admitting: Oncology

## 2011-05-14 ENCOUNTER — Other Ambulatory Visit: Payer: Self-pay | Admitting: Oncology

## 2011-05-14 DIAGNOSIS — G894 Chronic pain syndrome: Secondary | ICD-10-CM

## 2011-05-14 DIAGNOSIS — C9002 Multiple myeloma in relapse: Secondary | ICD-10-CM

## 2011-05-14 DIAGNOSIS — C9000 Multiple myeloma not having achieved remission: Secondary | ICD-10-CM

## 2011-05-14 LAB — CBC WITH DIFFERENTIAL/PLATELET
Basophils Absolute: 0 10*3/uL (ref 0.0–0.1)
EOS%: 2.2 % (ref 0.0–7.0)
HGB: 14.5 g/dL (ref 11.6–15.9)
MCH: 30.8 pg (ref 25.1–34.0)
MONO#: 0.5 10*3/uL (ref 0.1–0.9)
NEUT#: 3.8 10*3/uL (ref 1.5–6.5)
RDW: 14 % (ref 11.2–14.5)
WBC: 6.1 10*3/uL (ref 3.9–10.3)
lymph#: 1.7 10*3/uL (ref 0.9–3.3)

## 2011-05-18 ENCOUNTER — Encounter (HOSPITAL_BASED_OUTPATIENT_CLINIC_OR_DEPARTMENT_OTHER): Payer: Medicare Other | Admitting: Oncology

## 2011-05-18 ENCOUNTER — Other Ambulatory Visit: Payer: Self-pay | Admitting: Oncology

## 2011-05-18 DIAGNOSIS — C9002 Multiple myeloma in relapse: Secondary | ICD-10-CM

## 2011-05-18 DIAGNOSIS — R110 Nausea: Secondary | ICD-10-CM

## 2011-05-18 DIAGNOSIS — C9000 Multiple myeloma not having achieved remission: Secondary | ICD-10-CM

## 2011-05-18 DIAGNOSIS — G894 Chronic pain syndrome: Secondary | ICD-10-CM

## 2011-05-18 LAB — CBC WITH DIFFERENTIAL/PLATELET
Basophils Absolute: 0 10*3/uL (ref 0.0–0.1)
Eosinophils Absolute: 0.3 10*3/uL (ref 0.0–0.5)
HCT: 41 % (ref 34.8–46.6)
HGB: 14.2 g/dL (ref 11.6–15.9)
MCV: 89.8 fL (ref 79.5–101.0)
MONO%: 9.2 % (ref 0.0–14.0)
NEUT#: 2.9 10*3/uL (ref 1.5–6.5)
RDW: 14.2 % (ref 11.2–14.5)
lymph#: 1.8 10*3/uL (ref 0.9–3.3)

## 2011-05-18 LAB — COMPREHENSIVE METABOLIC PANEL
Alkaline Phosphatase: 64 U/L (ref 39–117)
Glucose, Bld: 83 mg/dL (ref 70–99)
Sodium: 134 mEq/L — ABNORMAL LOW (ref 135–145)
Total Bilirubin: 0.3 mg/dL (ref 0.3–1.2)
Total Protein: 6.8 g/dL (ref 6.0–8.3)

## 2011-05-21 ENCOUNTER — Encounter (HOSPITAL_BASED_OUTPATIENT_CLINIC_OR_DEPARTMENT_OTHER): Payer: Medicare Other | Admitting: Oncology

## 2011-05-21 DIAGNOSIS — C9002 Multiple myeloma in relapse: Secondary | ICD-10-CM

## 2011-05-25 ENCOUNTER — Other Ambulatory Visit: Payer: Self-pay | Admitting: Oncology

## 2011-05-25 ENCOUNTER — Encounter (HOSPITAL_BASED_OUTPATIENT_CLINIC_OR_DEPARTMENT_OTHER): Payer: Medicare Other | Admitting: Oncology

## 2011-05-25 DIAGNOSIS — C9002 Multiple myeloma in relapse: Secondary | ICD-10-CM

## 2011-05-25 DIAGNOSIS — Z5111 Encounter for antineoplastic chemotherapy: Secondary | ICD-10-CM

## 2011-05-25 DIAGNOSIS — C9000 Multiple myeloma not having achieved remission: Secondary | ICD-10-CM

## 2011-05-25 DIAGNOSIS — G894 Chronic pain syndrome: Secondary | ICD-10-CM

## 2011-05-25 LAB — CBC WITH DIFFERENTIAL/PLATELET
Basophils Absolute: 0 10*3/uL (ref 0.0–0.1)
Eosinophils Absolute: 0.3 10*3/uL (ref 0.0–0.5)
HCT: 43.5 % (ref 34.8–46.6)
HGB: 14.8 g/dL (ref 11.6–15.9)
LYMPH%: 25.7 % (ref 14.0–49.7)
MCHC: 34 g/dL (ref 31.5–36.0)
MONO#: 0.5 10*3/uL (ref 0.1–0.9)
NEUT#: 3.3 10*3/uL (ref 1.5–6.5)
NEUT%: 59.5 % (ref 38.4–76.8)
Platelets: 214 10*3/uL (ref 145–400)
WBC: 5.5 10*3/uL (ref 3.9–10.3)

## 2011-05-26 LAB — KAPPA/LAMBDA LIGHT CHAINS
Kappa:Lambda Ratio: 6.5 — ABNORMAL HIGH (ref 0.26–1.65)
Lambda Free Lght Chn: 1.08 mg/dL (ref 0.57–2.63)

## 2011-05-26 LAB — COMPREHENSIVE METABOLIC PANEL
AST: 47 U/L — ABNORMAL HIGH (ref 0–37)
Albumin: 4.2 g/dL (ref 3.5–5.2)
Alkaline Phosphatase: 79 U/L (ref 39–117)
BUN: 12 mg/dL (ref 6–23)
Glucose, Bld: 89 mg/dL (ref 70–99)
Potassium: 4.5 mEq/L (ref 3.5–5.3)
Total Bilirubin: 0.5 mg/dL (ref 0.3–1.2)

## 2011-06-01 ENCOUNTER — Other Ambulatory Visit: Payer: Self-pay | Admitting: Oncology

## 2011-06-01 ENCOUNTER — Encounter (HOSPITAL_BASED_OUTPATIENT_CLINIC_OR_DEPARTMENT_OTHER): Payer: Medicare Other | Admitting: Oncology

## 2011-06-01 DIAGNOSIS — C9000 Multiple myeloma not having achieved remission: Secondary | ICD-10-CM

## 2011-06-01 DIAGNOSIS — G894 Chronic pain syndrome: Secondary | ICD-10-CM

## 2011-06-01 DIAGNOSIS — Z5112 Encounter for antineoplastic immunotherapy: Secondary | ICD-10-CM

## 2011-06-01 DIAGNOSIS — C9002 Multiple myeloma in relapse: Secondary | ICD-10-CM

## 2011-06-01 LAB — CBC WITH DIFFERENTIAL/PLATELET
Basophils Absolute: 0.1 10*3/uL (ref 0.0–0.1)
EOS%: 5.6 % (ref 0.0–7.0)
Eosinophils Absolute: 0.4 10*3/uL (ref 0.0–0.5)
HCT: 43.4 % (ref 34.8–46.6)
HGB: 14.9 g/dL (ref 11.6–15.9)
MCH: 30.8 pg (ref 25.1–34.0)
MONO#: 0.5 10*3/uL (ref 0.1–0.9)
NEUT#: 3.6 10*3/uL (ref 1.5–6.5)
NEUT%: 56.4 % (ref 38.4–76.8)
RDW: 15.4 % — ABNORMAL HIGH (ref 11.2–14.5)
WBC: 6.4 10*3/uL (ref 3.9–10.3)
lymph#: 1.9 10*3/uL (ref 0.9–3.3)

## 2011-06-01 LAB — BASIC METABOLIC PANEL
BUN: 9 mg/dL (ref 6–23)
Chloride: 104 mEq/L (ref 96–112)
Glucose, Bld: 91 mg/dL (ref 70–99)
Potassium: 4.3 mEq/L (ref 3.5–5.3)
Sodium: 136 mEq/L (ref 135–145)

## 2011-06-15 ENCOUNTER — Encounter (HOSPITAL_BASED_OUTPATIENT_CLINIC_OR_DEPARTMENT_OTHER): Payer: PRIVATE HEALTH INSURANCE | Admitting: Oncology

## 2011-06-15 ENCOUNTER — Other Ambulatory Visit: Payer: Self-pay | Admitting: Oncology

## 2011-06-15 DIAGNOSIS — G894 Chronic pain syndrome: Secondary | ICD-10-CM

## 2011-06-15 DIAGNOSIS — Z5112 Encounter for antineoplastic immunotherapy: Secondary | ICD-10-CM

## 2011-06-15 DIAGNOSIS — C9002 Multiple myeloma in relapse: Secondary | ICD-10-CM

## 2011-06-15 DIAGNOSIS — C9000 Multiple myeloma not having achieved remission: Secondary | ICD-10-CM

## 2011-06-15 DIAGNOSIS — M545 Low back pain: Secondary | ICD-10-CM

## 2011-06-15 LAB — CBC WITH DIFFERENTIAL/PLATELET
BASO%: 0.5 % (ref 0.0–2.0)
Basophils Absolute: 0 10*3/uL (ref 0.0–0.1)
EOS%: 3.2 % (ref 0.0–7.0)
HCT: 44.4 % (ref 34.8–46.6)
HGB: 15 g/dL (ref 11.6–15.9)
MCH: 31 pg (ref 25.1–34.0)
MCHC: 33.8 g/dL (ref 31.5–36.0)
MONO#: 0.6 10*3/uL (ref 0.1–0.9)
NEUT%: 56.5 % (ref 38.4–76.8)
RDW: 15.8 % — ABNORMAL HIGH (ref 11.2–14.5)
WBC: 5.9 10*3/uL (ref 3.9–10.3)
lymph#: 1.8 10*3/uL (ref 0.9–3.3)

## 2011-06-15 LAB — BASIC METABOLIC PANEL
BUN: 12 mg/dL (ref 6–23)
Chloride: 104 mEq/L (ref 96–112)
Potassium: 4.9 mEq/L (ref 3.5–5.3)
Sodium: 137 mEq/L (ref 135–145)

## 2011-06-22 ENCOUNTER — Other Ambulatory Visit: Payer: Self-pay | Admitting: Oncology

## 2011-06-22 ENCOUNTER — Encounter (HOSPITAL_BASED_OUTPATIENT_CLINIC_OR_DEPARTMENT_OTHER): Payer: Medicare Other | Admitting: Oncology

## 2011-06-22 DIAGNOSIS — Z5112 Encounter for antineoplastic immunotherapy: Secondary | ICD-10-CM

## 2011-06-22 DIAGNOSIS — K5900 Constipation, unspecified: Secondary | ICD-10-CM

## 2011-06-22 DIAGNOSIS — Z5111 Encounter for antineoplastic chemotherapy: Secondary | ICD-10-CM

## 2011-06-22 DIAGNOSIS — M545 Low back pain: Secondary | ICD-10-CM

## 2011-06-22 DIAGNOSIS — C9002 Multiple myeloma in relapse: Secondary | ICD-10-CM

## 2011-06-22 LAB — COMPREHENSIVE METABOLIC PANEL
ALT: 30 U/L (ref 0–35)
AST: 29 U/L (ref 0–37)
Albumin: 3.8 g/dL (ref 3.5–5.2)
Calcium: 9.2 mg/dL (ref 8.4–10.5)
Chloride: 107 mEq/L (ref 96–112)
Potassium: 4.1 mEq/L (ref 3.5–5.3)
Sodium: 138 mEq/L (ref 135–145)

## 2011-06-22 LAB — CBC WITH DIFFERENTIAL/PLATELET
BASO%: 0.6 % (ref 0.0–2.0)
Basophils Absolute: 0 10*3/uL (ref 0.0–0.1)
EOS%: 2.2 % (ref 0.0–7.0)
Eosinophils Absolute: 0.1 10*3/uL (ref 0.0–0.5)
HCT: 39.6 % (ref 34.8–46.6)
LYMPH%: 23.6 % (ref 14.0–49.7)
MCV: 91.5 fL (ref 79.5–101.0)
MONO#: 0.5 10*3/uL (ref 0.1–0.9)
MONO%: 9.7 % (ref 0.0–14.0)
NEUT#: 3.6 10*3/uL (ref 1.5–6.5)
NEUT%: 63.9 % (ref 38.4–76.8)
Platelets: 165 10*3/uL (ref 145–400)
WBC: 5.6 10*3/uL (ref 3.9–10.3)

## 2011-06-29 ENCOUNTER — Other Ambulatory Visit: Payer: Self-pay | Admitting: Oncology

## 2011-06-29 ENCOUNTER — Encounter (HOSPITAL_BASED_OUTPATIENT_CLINIC_OR_DEPARTMENT_OTHER): Payer: Medicare Other | Admitting: Oncology

## 2011-06-29 DIAGNOSIS — C9002 Multiple myeloma in relapse: Secondary | ICD-10-CM

## 2011-06-29 DIAGNOSIS — M545 Low back pain: Secondary | ICD-10-CM

## 2011-06-29 DIAGNOSIS — C7952 Secondary malignant neoplasm of bone marrow: Secondary | ICD-10-CM

## 2011-06-29 DIAGNOSIS — M25551 Pain in right hip: Secondary | ICD-10-CM

## 2011-06-29 DIAGNOSIS — C7951 Secondary malignant neoplasm of bone: Secondary | ICD-10-CM

## 2011-06-29 DIAGNOSIS — Z5111 Encounter for antineoplastic chemotherapy: Secondary | ICD-10-CM

## 2011-06-29 LAB — CBC WITH DIFFERENTIAL/PLATELET
Basophils Absolute: 0 10*3/uL (ref 0.0–0.1)
EOS%: 2.3 % (ref 0.0–7.0)
HCT: 41.7 % (ref 34.8–46.6)
HGB: 14.1 g/dL (ref 11.6–15.9)
MCH: 31.1 pg (ref 25.1–34.0)
NEUT%: 61.5 % (ref 38.4–76.8)
Platelets: 178 10*3/uL (ref 145–400)
lymph#: 1.4 10*3/uL (ref 0.9–3.3)

## 2011-06-29 LAB — COMPREHENSIVE METABOLIC PANEL
BUN: 11 mg/dL (ref 6–23)
CO2: 20 mEq/L (ref 19–32)
Calcium: 9.3 mg/dL (ref 8.4–10.5)
Chloride: 110 mEq/L (ref 96–112)
Creatinine, Ser: 0.81 mg/dL (ref 0.50–1.10)

## 2011-07-04 ENCOUNTER — Ambulatory Visit (HOSPITAL_COMMUNITY)
Admission: RE | Admit: 2011-07-04 | Discharge: 2011-07-04 | Disposition: A | Discharge status: Home / Self Care | Payer: Medicare Other | Source: Ambulatory Visit | Attending: Oncology | Admitting: Oncology

## 2011-07-04 ENCOUNTER — Inpatient Hospital Stay (HOSPITAL_COMMUNITY): Admission: RE | Admit: 2011-07-04 | Payer: PRIVATE HEALTH INSURANCE | Source: Ambulatory Visit

## 2011-07-04 DIAGNOSIS — M545 Low back pain: Secondary | ICD-10-CM

## 2011-07-04 DIAGNOSIS — M25552 Pain in left hip: Secondary | ICD-10-CM

## 2011-07-04 DIAGNOSIS — M25559 Pain in unspecified hip: Secondary | ICD-10-CM | POA: Insufficient documentation

## 2011-07-04 DIAGNOSIS — M549 Dorsalgia, unspecified: Secondary | ICD-10-CM | POA: Insufficient documentation

## 2011-07-04 DIAGNOSIS — C9000 Multiple myeloma not having achieved remission: Secondary | ICD-10-CM | POA: Insufficient documentation

## 2011-07-04 DIAGNOSIS — M47817 Spondylosis without myelopathy or radiculopathy, lumbosacral region: Secondary | ICD-10-CM | POA: Insufficient documentation

## 2011-07-04 DIAGNOSIS — M25551 Pain in right hip: Secondary | ICD-10-CM

## 2011-07-04 MED ORDER — GADOBENATE DIMEGLUMINE 529 MG/ML IV SOLN
11.0000 mL | Freq: Once | INTRAVENOUS | Status: AC | PRN
  Administered 2011-07-04: 11 mL via INTRAVENOUS

## 2011-07-13 ENCOUNTER — Other Ambulatory Visit: Payer: Self-pay | Admitting: Oncology

## 2011-07-13 ENCOUNTER — Encounter (HOSPITAL_BASED_OUTPATIENT_CLINIC_OR_DEPARTMENT_OTHER): Payer: PRIVATE HEALTH INSURANCE | Admitting: Oncology

## 2011-07-13 DIAGNOSIS — C9002 Multiple myeloma in relapse: Secondary | ICD-10-CM

## 2011-07-13 DIAGNOSIS — G8929 Other chronic pain: Secondary | ICD-10-CM

## 2011-07-13 DIAGNOSIS — M545 Low back pain: Secondary | ICD-10-CM

## 2011-07-13 DIAGNOSIS — Z5112 Encounter for antineoplastic immunotherapy: Secondary | ICD-10-CM

## 2011-07-13 LAB — CBC WITH DIFFERENTIAL/PLATELET
BASO%: 0.4 % (ref 0.0–2.0)
Basophils Absolute: 0 10*3/uL (ref 0.0–0.1)
Eosinophils Absolute: 0.1 10*3/uL (ref 0.0–0.5)
HCT: 46.4 % (ref 34.8–46.6)
LYMPH%: 24.9 % (ref 14.0–49.7)
MCHC: 34.1 g/dL (ref 31.5–36.0)
MCV: 93.2 fL (ref 79.5–101.0)
MONO%: 6.8 % (ref 0.0–14.0)
NEUT#: 4.5 10*3/uL (ref 1.5–6.5)
Platelets: 201 10*3/uL (ref 145–400)
RBC: 4.98 10*6/uL (ref 3.70–5.45)
RDW: 16.2 % — ABNORMAL HIGH (ref 11.2–14.5)
WBC: 6.9 10*3/uL (ref 3.9–10.3)
lymph#: 1.7 10*3/uL (ref 0.9–3.3)

## 2011-07-14 LAB — COMPREHENSIVE METABOLIC PANEL
ALT: 40 U/L — ABNORMAL HIGH (ref 0–35)
AST: 39 U/L — ABNORMAL HIGH (ref 0–37)
Alkaline Phosphatase: 63 U/L (ref 39–117)
Potassium: 3.9 mEq/L (ref 3.5–5.3)
Sodium: 139 mEq/L (ref 135–145)
Total Bilirubin: 0.5 mg/dL (ref 0.3–1.2)
Total Protein: 6.9 g/dL (ref 6.0–8.3)

## 2011-07-20 ENCOUNTER — Encounter (HOSPITAL_BASED_OUTPATIENT_CLINIC_OR_DEPARTMENT_OTHER): Payer: Medicare Other | Admitting: Oncology

## 2011-07-20 ENCOUNTER — Other Ambulatory Visit: Payer: Self-pay | Admitting: Oncology

## 2011-07-20 DIAGNOSIS — C9002 Multiple myeloma in relapse: Secondary | ICD-10-CM

## 2011-07-20 DIAGNOSIS — Z5112 Encounter for antineoplastic immunotherapy: Secondary | ICD-10-CM

## 2011-07-20 LAB — CBC WITH DIFFERENTIAL/PLATELET
Basophils Absolute: 0 10*3/uL (ref 0.0–0.1)
Eosinophils Absolute: 0.2 10*3/uL (ref 0.0–0.5)
HCT: 43.1 % (ref 34.8–46.6)
HGB: 14.7 g/dL (ref 11.6–15.9)
LYMPH%: 24.4 % (ref 14.0–49.7)
MCV: 93.7 fL (ref 79.5–101.0)
MONO%: 9.1 % (ref 0.0–14.0)
NEUT#: 3.3 10*3/uL (ref 1.5–6.5)
NEUT%: 62.6 % (ref 38.4–76.8)
Platelets: 158 10*3/uL (ref 145–400)
RDW: 15.3 % — ABNORMAL HIGH (ref 11.2–14.5)

## 2011-07-22 MED ORDER — GADOBENATE DIMEGLUMINE 529 MG/ML IV SOLN
15.0000 mL | Freq: Once | INTRAVENOUS | Status: AC | PRN
  Administered 2011-07-22: 11 mL via INTRAVENOUS

## 2011-07-23 LAB — LIPID PANEL
LDL Cholesterol: 112 — ABNORMAL HIGH
Total CHOL/HDL Ratio: 5.1
Triglycerides: 99
VLDL: 20

## 2011-07-23 LAB — CARDIAC PANEL(CRET KIN+CKTOT+MB+TROPI)
CK, MB: 1
Relative Index: INVALID
Troponin I: 0.04

## 2011-07-23 LAB — CBC
HCT: 39.1
Hemoglobin: 13.4
MCHC: 34.3
RDW: 15.8 — ABNORMAL HIGH

## 2011-07-23 LAB — DIFFERENTIAL
Basophils Absolute: 0
Basophils Relative: 1
Eosinophils Relative: 3
Monocytes Absolute: 0.6
Neutro Abs: 3.3

## 2011-07-23 LAB — BASIC METABOLIC PANEL
CO2: 26
Calcium: 9
Glucose, Bld: 111 — ABNORMAL HIGH
Sodium: 132 — ABNORMAL LOW

## 2011-07-27 ENCOUNTER — Other Ambulatory Visit: Payer: Self-pay | Admitting: Oncology

## 2011-07-27 ENCOUNTER — Encounter (HOSPITAL_BASED_OUTPATIENT_CLINIC_OR_DEPARTMENT_OTHER): Payer: Medicare Other | Admitting: Oncology

## 2011-07-27 DIAGNOSIS — Z5112 Encounter for antineoplastic immunotherapy: Secondary | ICD-10-CM

## 2011-07-27 DIAGNOSIS — C9002 Multiple myeloma in relapse: Secondary | ICD-10-CM

## 2011-07-27 LAB — CBC WITH DIFFERENTIAL/PLATELET
BASO%: 0.3 % (ref 0.0–2.0)
LYMPH%: 22 % (ref 14.0–49.7)
MCHC: 34.8 g/dL (ref 31.5–36.0)
MONO#: 0.4 10*3/uL (ref 0.1–0.9)
RBC: 4.55 10*6/uL (ref 3.70–5.45)
WBC: 5.7 10*3/uL (ref 3.9–10.3)
lymph#: 1.3 10*3/uL (ref 0.9–3.3)

## 2011-07-27 LAB — COMPREHENSIVE METABOLIC PANEL
ALT: 36 U/L — ABNORMAL HIGH (ref 0–35)
AST: 44 U/L — ABNORMAL HIGH (ref 0–37)
CO2: 21 mEq/L (ref 19–32)
Chloride: 106 mEq/L (ref 96–112)
Sodium: 137 mEq/L (ref 135–145)
Total Bilirubin: 0.4 mg/dL (ref 0.3–1.2)
Total Protein: 6.5 g/dL (ref 6.0–8.3)

## 2011-07-29 ENCOUNTER — Telehealth: Payer: Self-pay | Admitting: *Deleted

## 2011-07-29 LAB — BASIC METABOLIC PANEL
BUN: 14
CO2: 25
Calcium: 9.5
Creatinine, Ser: 0.77
GFR calc Af Amer: >60
Glucose, Bld: 81

## 2011-07-29 LAB — PROTIME-INR
INR: 1
Prothrombin Time: 12.9

## 2011-07-29 LAB — CBC
MCHC: 33.8
Platelets: 247
RBC: 4.6
RDW: 15.4

## 2011-07-29 NOTE — Telephone Encounter (Signed)
Pt is asking for a new prescription for Diazepam 5 mg.   She has one at [facility] from Dr. Gaylyn Rong, but he will not write if for more than one daily, and it is too soon to pick up.  She wants the prescription to read prn as Dr. Cato Mulligan always wrote before.   Dr. Gaylyn Rong will not do this.

## 2011-07-30 NOTE — Telephone Encounter (Signed)
Pt refuses prescription.

## 2011-07-30 NOTE — Telephone Encounter (Signed)
LMTCB

## 2011-07-30 NOTE — Telephone Encounter (Signed)
Should limit dose to one or less daily.  I can write the medication but please call pharmacy and make sure that they do not accept another Rx from any other provider for benzodiazepine

## 2011-08-10 ENCOUNTER — Other Ambulatory Visit: Payer: Self-pay | Admitting: Oncology

## 2011-08-10 ENCOUNTER — Encounter (HOSPITAL_BASED_OUTPATIENT_CLINIC_OR_DEPARTMENT_OTHER): Payer: PRIVATE HEALTH INSURANCE | Admitting: Oncology

## 2011-08-10 DIAGNOSIS — Z5112 Encounter for antineoplastic immunotherapy: Secondary | ICD-10-CM

## 2011-08-10 DIAGNOSIS — M5430 Sciatica, unspecified side: Secondary | ICD-10-CM

## 2011-08-10 DIAGNOSIS — C9002 Multiple myeloma in relapse: Secondary | ICD-10-CM

## 2011-08-10 LAB — CBC
Hemoglobin: 11.6 — ABNORMAL LOW
RBC: 3.71 — ABNORMAL LOW

## 2011-08-10 LAB — DIFFERENTIAL
Lymphocytes Relative: 16
Monocytes Absolute: 0.8
Monocytes Relative: 14 — ABNORMAL HIGH
Neutro Abs: 3.5

## 2011-08-10 LAB — CBC WITH DIFFERENTIAL/PLATELET
Basophils Absolute: 0 10*3/uL (ref 0.0–0.1)
Eosinophils Absolute: 0.2 10*3/uL (ref 0.0–0.5)
HCT: 44.5 % (ref 34.8–46.6)
HGB: 15.3 g/dL (ref 11.6–15.9)
LYMPH%: 31.9 % (ref 14.0–49.7)
MONO#: 0.4 10*3/uL (ref 0.1–0.9)
NEUT#: 2.9 10*3/uL (ref 1.5–6.5)
NEUT%: 56 % (ref 38.4–76.8)
Platelets: 199 10*3/uL (ref 145–400)
WBC: 5.2 10*3/uL (ref 3.9–10.3)

## 2011-08-11 LAB — COMPREHENSIVE METABOLIC PANEL
CO2: 24 mEq/L (ref 19–32)
Creatinine, Ser: 0.85 mg/dL (ref 0.50–1.10)
Glucose, Bld: 119 mg/dL — ABNORMAL HIGH (ref 70–99)
Sodium: 140 mEq/L (ref 135–145)
Total Bilirubin: 0.4 mg/dL (ref 0.3–1.2)
Total Protein: 6.8 g/dL (ref 6.0–8.3)

## 2011-08-11 LAB — KAPPA/LAMBDA LIGHT CHAINS
Kappa free light chain: 7.79 mg/dL — ABNORMAL HIGH (ref 0.33–1.94)
Lambda Free Lght Chn: 1.44 mg/dL (ref 0.57–2.63)

## 2011-08-13 ENCOUNTER — Encounter: Payer: Self-pay | Admitting: Oncology

## 2011-08-13 DIAGNOSIS — M5430 Sciatica, unspecified side: Secondary | ICD-10-CM | POA: Insufficient documentation

## 2011-08-14 ENCOUNTER — Encounter: Payer: Self-pay | Admitting: *Deleted

## 2011-08-17 ENCOUNTER — Encounter (HOSPITAL_BASED_OUTPATIENT_CLINIC_OR_DEPARTMENT_OTHER): Payer: Medicare Other | Admitting: Oncology

## 2011-08-17 ENCOUNTER — Other Ambulatory Visit: Payer: Self-pay | Admitting: Oncology

## 2011-08-17 DIAGNOSIS — G8929 Other chronic pain: Secondary | ICD-10-CM

## 2011-08-17 DIAGNOSIS — C9002 Multiple myeloma in relapse: Secondary | ICD-10-CM

## 2011-08-17 DIAGNOSIS — M545 Low back pain, unspecified: Secondary | ICD-10-CM

## 2011-08-17 DIAGNOSIS — Z5112 Encounter for antineoplastic immunotherapy: Secondary | ICD-10-CM

## 2011-08-17 LAB — CBC WITH DIFFERENTIAL/PLATELET
BASO%: 0.5 % (ref 0.0–2.0)
EOS%: 4 % (ref 0.0–7.0)
Eosinophils Absolute: 0.2 10*3/uL (ref 0.0–0.5)
MCV: 93.2 fL (ref 79.5–101.0)
MONO%: 11.1 % (ref 0.0–14.0)
NEUT#: 3.7 10*3/uL (ref 1.5–6.5)
RBC: 4.85 10*6/uL (ref 3.70–5.45)
RDW: 14.6 % — ABNORMAL HIGH (ref 11.2–14.5)

## 2011-08-24 ENCOUNTER — Other Ambulatory Visit: Payer: Self-pay | Admitting: Oncology

## 2011-08-24 ENCOUNTER — Encounter (HOSPITAL_BASED_OUTPATIENT_CLINIC_OR_DEPARTMENT_OTHER): Payer: Medicare Other | Admitting: Oncology

## 2011-08-24 DIAGNOSIS — C9002 Multiple myeloma in relapse: Secondary | ICD-10-CM

## 2011-08-24 DIAGNOSIS — M545 Low back pain: Secondary | ICD-10-CM

## 2011-08-24 DIAGNOSIS — Z5112 Encounter for antineoplastic immunotherapy: Secondary | ICD-10-CM

## 2011-08-24 DIAGNOSIS — G8929 Other chronic pain: Secondary | ICD-10-CM

## 2011-08-24 LAB — CBC WITH DIFFERENTIAL/PLATELET
Eosinophils Absolute: 0.2 10*3/uL (ref 0.0–0.5)
MONO#: 0.5 10*3/uL (ref 0.1–0.9)
NEUT#: 4.2 10*3/uL (ref 1.5–6.5)
RBC: 4.62 10*6/uL (ref 3.70–5.45)
RDW: 14.2 % (ref 11.2–14.5)
WBC: 6.3 10*3/uL (ref 3.9–10.3)
lymph#: 1.4 10*3/uL (ref 0.9–3.3)

## 2011-08-25 ENCOUNTER — Other Ambulatory Visit: Payer: Self-pay | Admitting: Oncology

## 2011-08-28 ENCOUNTER — Encounter: Payer: Self-pay | Admitting: *Deleted

## 2011-09-01 ENCOUNTER — Ambulatory Visit (INDEPENDENT_AMBULATORY_CARE_PROVIDER_SITE_OTHER): Payer: Medicare Other

## 2011-09-01 ENCOUNTER — Other Ambulatory Visit: Payer: Self-pay | Admitting: Oncology

## 2011-09-01 ENCOUNTER — Telehealth: Payer: Self-pay | Admitting: Family Medicine

## 2011-09-01 DIAGNOSIS — Z23 Encounter for immunization: Secondary | ICD-10-CM

## 2011-09-01 NOTE — Telephone Encounter (Signed)
I can type the letter for you if you agree to this

## 2011-09-01 NOTE — Telephone Encounter (Signed)
yes

## 2011-09-01 NOTE — Telephone Encounter (Signed)
Dr. Lodema Pilot office called regarding this patient. Apparently it has been discovered the she has been getting pain meds from BOTH our office, Dr. Lodema Pilot office, & Dr.  Loraine Leriche Phillip's office. Since this has come to light, Dr. Berton Lan offic (pain clinic) will not agree to take her back as a patient until their office receives a letter from Ha's office and out office stating that each are no longer prescribing Ms. Erhart any pain meds. Please fax this letter to Dr. Tito Dine: [phone] ATTN: S[place] Cheese. Thank you.

## 2011-09-03 NOTE — Telephone Encounter (Signed)
Letter faxed.

## 2011-09-05 ENCOUNTER — Other Ambulatory Visit: Payer: Self-pay | Admitting: Oncology

## 2011-09-07 ENCOUNTER — Telehealth: Payer: Self-pay

## 2011-09-07 ENCOUNTER — Other Ambulatory Visit: Payer: Self-pay | Admitting: Oncology

## 2011-09-07 ENCOUNTER — Telehealth: Payer: Self-pay | Admitting: Oncology

## 2011-09-07 ENCOUNTER — Other Ambulatory Visit (HOSPITAL_BASED_OUTPATIENT_CLINIC_OR_DEPARTMENT_OTHER): Payer: Medicare Other | Admitting: Lab

## 2011-09-07 ENCOUNTER — Ambulatory Visit (HOSPITAL_BASED_OUTPATIENT_CLINIC_OR_DEPARTMENT_OTHER): Payer: Medicare Other

## 2011-09-07 VITALS — BP 143/86 | HR 74 | Temp 97.6°F | Ht 64.0 in | Wt 129.6 lb

## 2011-09-07 DIAGNOSIS — Z5112 Encounter for antineoplastic immunotherapy: Secondary | ICD-10-CM

## 2011-09-07 DIAGNOSIS — G8929 Other chronic pain: Secondary | ICD-10-CM

## 2011-09-07 DIAGNOSIS — C9002 Multiple myeloma in relapse: Secondary | ICD-10-CM

## 2011-09-07 DIAGNOSIS — C9000 Multiple myeloma not having achieved remission: Secondary | ICD-10-CM

## 2011-09-07 DIAGNOSIS — M545 Low back pain: Secondary | ICD-10-CM

## 2011-09-07 DIAGNOSIS — C8299 Follicular lymphoma, unspecified, extranodal and solid organ sites: Secondary | ICD-10-CM

## 2011-09-07 LAB — CBC WITH DIFFERENTIAL/PLATELET
Eosinophils Absolute: 0.2 10*3/uL (ref 0.0–0.5)
HCT: 45.4 % (ref 34.8–46.6)
LYMPH%: 26 % (ref 14.0–49.7)
MONO#: 0.4 10*3/uL (ref 0.1–0.9)
NEUT#: 3.5 10*3/uL (ref 1.5–6.5)
NEUT%: 61.6 % (ref 38.4–76.8)
Platelets: 202 10*3/uL (ref 145–400)
WBC: 5.7 10*3/uL (ref 3.9–10.3)
lymph#: 1.5 10*3/uL (ref 0.9–3.3)

## 2011-09-07 MED ORDER — BORTEZOMIB CHEMO SQ INJECTION 3.5 MG (2.5MG/ML)
1.3000 mg/m2 | Freq: Once | INTRAMUSCULAR | Status: AC
  Administered 2011-09-07: 2 mg via SUBCUTANEOUS
  Filled 2011-09-07: qty 2

## 2011-09-07 MED ORDER — ONDANSETRON HCL 8 MG PO TABS
8.0000 mg | ORAL_TABLET | Freq: Once | ORAL | Status: AC
  Administered 2011-09-07: 8 mg via ORAL

## 2011-09-07 NOTE — Telephone Encounter (Signed)
Patient called to inquire about pain; informed patient that [facility]---Dr. Loraine Leriche Phillps----[phone]) will be calling her soon;pain clinic received letter from Dr. Gaylyn Rong stating that he no longer prescribes pain meds;Dr. Marliss Coots also sent letter; patient aware.

## 2011-09-08 ENCOUNTER — Encounter: Payer: Self-pay | Admitting: Certified Registered Nurse Anesthetist

## 2011-09-08 NOTE — Telephone Encounter (Signed)
Kendra Johns

## 2011-09-09 ENCOUNTER — Ambulatory Visit: Payer: Medicare Other

## 2011-09-09 LAB — SPEP & IFE WITH QIG
Beta Globulin: 6 % (ref 4.7–7.2)
Gamma Globulin: 14.9 % (ref 11.1–18.8)
IgA: 59 mg/dL — ABNORMAL LOW (ref 69–380)
IgG (Immunoglobin G), Serum: 1210 mg/dL (ref 690–1700)
Total Protein, Serum Electrophoresis: 7.4 g/dL (ref 6.0–8.3)

## 2011-09-09 LAB — KAPPA/LAMBDA LIGHT CHAINS
Kappa free light chain: 7.3 mg/dL — ABNORMAL HIGH (ref 0.33–1.94)
Kappa:Lambda Ratio: 5.84 — ABNORMAL HIGH (ref 0.26–1.65)

## 2011-09-10 ENCOUNTER — Other Ambulatory Visit: Payer: Self-pay | Admitting: Oncology

## 2011-09-11 ENCOUNTER — Other Ambulatory Visit: Payer: Self-pay | Admitting: Oncology

## 2011-09-11 LAB — UIFE/LIGHT CHAINS/TP QN, 24-HR UR
Alpha 1, Urine: DETECTED — AB
Alpha 2, Urine: DETECTED — AB
Free Kappa Lt Chains,Ur: 30.7 mg/dL — ABNORMAL HIGH (ref 0.14–2.42)
Gamma Globulin, Urine: DETECTED — AB

## 2011-09-14 ENCOUNTER — Other Ambulatory Visit: Payer: Self-pay | Admitting: Oncology

## 2011-09-14 ENCOUNTER — Ambulatory Visit (HOSPITAL_BASED_OUTPATIENT_CLINIC_OR_DEPARTMENT_OTHER): Payer: Medicare Other

## 2011-09-14 ENCOUNTER — Other Ambulatory Visit (HOSPITAL_BASED_OUTPATIENT_CLINIC_OR_DEPARTMENT_OTHER): Payer: Medicare Other | Admitting: Lab

## 2011-09-14 ENCOUNTER — Telehealth: Payer: Self-pay | Admitting: Oncology

## 2011-09-14 ENCOUNTER — Ambulatory Visit (HOSPITAL_BASED_OUTPATIENT_CLINIC_OR_DEPARTMENT_OTHER): Payer: Medicare Other | Admitting: Oncology

## 2011-09-14 VITALS — BP 159/100 | HR 75 | Temp 97.9°F | Ht 64.0 in | Wt 131.1 lb

## 2011-09-14 DIAGNOSIS — C9000 Multiple myeloma not having achieved remission: Secondary | ICD-10-CM

## 2011-09-14 DIAGNOSIS — Z5112 Encounter for antineoplastic immunotherapy: Secondary | ICD-10-CM

## 2011-09-14 DIAGNOSIS — C9002 Multiple myeloma in relapse: Secondary | ICD-10-CM

## 2011-09-14 DIAGNOSIS — M25559 Pain in unspecified hip: Secondary | ICD-10-CM

## 2011-09-14 DIAGNOSIS — I10 Essential (primary) hypertension: Secondary | ICD-10-CM

## 2011-09-14 LAB — CBC WITH DIFFERENTIAL/PLATELET
Basophils Absolute: 0.1 10*3/uL (ref 0.0–0.1)
Eosinophils Absolute: 0.2 10*3/uL (ref 0.0–0.5)
HGB: 14.8 g/dL (ref 11.6–15.9)
LYMPH%: 20.1 % (ref 14.0–49.7)
MCV: 93.7 fL (ref 79.5–101.0)
MONO%: 6.9 % (ref 0.0–14.0)
NEUT#: 3.4 10*3/uL (ref 1.5–6.5)
Platelets: 175 10*3/uL (ref 145–400)

## 2011-09-14 MED ORDER — CYCLOPHOSPHAMIDE 50 MG PO TABS: 450.0000 mg | ORAL_TABLET | ORAL | Status: DC

## 2011-09-14 MED ORDER — ONDANSETRON HCL 8 MG PO TABS
8.0000 mg | ORAL_TABLET | Freq: Once | ORAL | Status: AC
  Administered 2011-09-14: 8 mg via ORAL

## 2011-09-14 MED ORDER — BORTEZOMIB CHEMO SQ INJECTION 3.5 MG (2.5MG/ML)
1.3000 mg/m2 | Freq: Once | INTRAMUSCULAR | Status: AC
  Administered 2011-09-14: 2 mg via SUBCUTANEOUS
  Filled 2011-09-14: qty 2

## 2011-09-14 NOTE — Telephone Encounter (Signed)
gve the pt his nov,dec 2012 appt calendar °

## 2011-09-14 NOTE — Progress Notes (Signed)
[facility] OFFICE PROGRESS NOTE  [name], MD, MD [address] [place] [address]   INTERVAL HISTORY: Kendra Johns 60 y.o. female returns for regular follow up.  She recently saw Dr. Aneta Mins for pain management. Her long-acting Opana was n switched to MS Contin and her pain has been better managed.  She still has right low back pain radiating down to the right lateral side that was thought to be sciatica by Dr. Lovell Sheehan the pain that was described as moderate is supposed to severe before. She is able to ambulate using a walker and after walking about , she has more pain needing to sit down which would ease the pain.  This is much improved compared to a month ago.  She is tolerating chemotherapy Cytoxan and healthy with now major problem. She denied skin rash neuropathy, fever, nausea vomiting, abdominal pain, bleeding symptoms, severe fatigue.   MEDICAL HISTORY: Past Medical History  Diagnosis Date  . Depression   . Hypertension   . Hepatitis C     genotype 1  . Diverticulosis of colon   . Multiple myeloma     kappa light chain, s/p high-dose chemo/stem cell tx - [facility]  . Hx of adenomatous colonic polyps 2007  . Anxiety   . Osteoarthritis   . Sleep apnea   . Hyperlipidemia   . GERD with stricture   . External hemorrhoids   . Hepatitis B virus infection   . IBS (irritable bowel syndrome)   . Helicobacter pylori gastritis 2001    ? if treated  . Sciatic nerve pain     with Dr. Delma Officer  . Chronic low back pain     ALLERGIES:  is allergic to codeine and ibuprofen.  MEDICATIONS: Current Outpatient Prescriptions  Medication Sig Dispense Refill  . acyclovir (ZOVIRAX) 400 MG tablet Take 400 mg by mouth 2 (two) times daily.        . Bisacodyl (LAXATIVE PO) Take by mouth. As needed       . cyclophosphamide (CYTOXAN) 50 MG tablet Take 450 mg by mouth once a week. 9 tablets on empty stomach, 1 hour before or 2 hours after meals.   Marland Kitchen dexamethasone (DECADRON) 4 MG tablet Take 4 mg by mouth once a week. Take 10 tabs weekly       . diazepam (VALIUM) 5 MG tablet Take 1 tablet (5 mg total) by mouth as needed.  20 tablet  0  . gabapentin (NEURONTIN) 600 MG tablet Take 600 mg by mouth 3 (three) times daily.        Marland Kitchen lisinopril (PRINIVIL,ZESTRIL) 20 MG tablet Take 1 tablet (20 mg total) by mouth daily.  90 tablet  11  . loperamide (IMODIUM) 2 MG capsule Take 2 mg by mouth as needed.        . Magnesium Hydroxide (MILK OF MAGNESIA PO) Take by mouth daily as needed.        Marland Kitchen morphine (MS CONTIN) 60 MG 12 hr tablet Take 60 mg by mouth 2 (two) times daily.        Marland Kitchen oxymorphone (OPANA) 10 MG tablet Take 10 mg by mouth every 6 (six) hours as needed. 1-2 tabs      . polyethylene glycol (MIRALAX / GLYCOLAX) packet Take 17 g by mouth every other day.        . prochlorperazine (COMPAZINE) 10 MG tablet Take 10 mg by mouth every 6 (six) hours as needed.        Marland Kitchen  Sodium Phosphates (ENEMA RE) Place rectally. As needed        No current facility-administered medications for this visit.   Facility-Administered Medications Ordered in Other Visits  Medication Dose Route Frequency Provider Last Rate Last Dose  . bortezomib SQ (VELCADE) chemo injection 2 mg  1.3 mg/m2 (Treatment Plan Actual) Subcutaneous Once Jethro Bolus, MD   2 mg at 09/14/11 1255  . ondansetron (ZOFRAN) tablet 8 mg  8 mg Oral Once Jethro Bolus, MD   8 mg at 09/14/11 1229    SURGICAL HISTORY:  Past Surgical History  Procedure Date  . Abdominal hysterectomy   . Oophorectomy   . Dilation and curettage of uterus   . Bone marrow transplant   . Appendectomy   . Colonoscopy w/ polypectomy 08/2006    1-2 adenomas, 6 polyps total, diverticulosis and external hemorrhoids  . Upper gastrointestinal endoscopy 08/2003    esophageal stricture dilation, hiatal hernia, gastrritis  . Cholecystectomy   . Tonsillectomy and adenoidectomy     REVIEW OF SYSTEMS:  Pertinent items are noted in HPI.     Filed Vitals:   09/14/11 1051  BP: 159/100  Pulse: 75  Temp: 97.9 F (36.6 C)   Wt Readings from Last 3 Encounters:  09/14/11 131 lb 1.6 oz (59.467 kg)  09/07/11 129 lb 9 oz (58.769 kg)  08/10/11 129 lb 14.4 oz (58.922 kg)    PHYSICAL EXAMINATION:   She was able to get on and off the exam table by herself; however, lying on her right side hurt her right hip.  General:  well-nourished in no acute distress.  Eyes:  no scleral icterus.  ENT:  There were no oropharyngeal lesions.  Neck was without thyromegaly.  Lymphatics:  Negative cervical, supraclavicular or axillary adenopathy.  Respiratory: lungs were clear bilaterally without wheezing or crackles.  Cardiovascular:  Regular rate and rhythm, S1/S2, without murmur, rub or gallop.  There was no pedal edema.  GI:  abdomen was soft, flat, nontender, nondistended, without organomegaly.  Muscoloskeletal:  no spinal tenderness of palpation of vertebral spine.  Skin exam was without echymosis, petichae.  Neuro exam was nonfocal.  Patient was able to get on and off exam table without assistance.  Gait was normal.  Patient was alerted and oriented.  Attention was good.   Language was appropriate.  Mood was normal without depression.  Speech was not pressured.  Thought content was not tangential.     LABORATORY/RADIOLOGY DATA:  Lab Results  Component Value Date   WBC 7.4 02/03/2011   HGB 14.8 09/14/2011   HCT 43.2 09/14/2011   PLT 175 09/14/2011   GLUCOSE 119* 08/10/2011   GLUCOSE 119* 08/10/2011   CHOL 240* 02/03/2011   TRIG 93.0 02/03/2011   HDL 43.00 02/03/2011   LDLDIRECT 176.9 02/03/2011   LDLCALC  Value: 112        Total Cholesterol/HDL:CHD Risk Coronary Heart Disease Risk Table                     Men   Women  1/2 Average Risk   3.4   3.3* 11/26/2007   ALT 33 08/10/2011   ALT 33 08/10/2011   AST 34 08/10/2011   AST 34 08/10/2011   NA 140 08/10/2011   NA 140 08/10/2011   K 4.9 08/10/2011   K 4.9 08/10/2011   CL 106 08/10/2011   CL 106 08/10/2011    CREATININE 0.85 08/10/2011   CREATININE 0.85 08/10/2011   BUN 10  08/10/2011   BUN 10 08/10/2011   CO2 24 08/10/2011   CO2 24 08/10/2011   TSH 2.01 02/03/2011   INR 1.0 03/12/2008   HGBA1C  Value: 5.8 (NOTE)   The ADA recommends the following therapeutic goals for glycemic   control related to Hgb A1C measurement:   Goal of Therapy:   < 7.0% Hgb A1C   Action Suggested:  > 8.0% Hgb A1C   Ref:  Diabetes Care, 22, Suppl. 1, 1999 11/26/2007    Serum free light chain 09/07/11:  Kappa 7.3; lambda 1.25; ratio (5.84; compared to ratio of 29.7 on 05/05/2011). SPEP:  No Mspike 24 hour urine collection on 09/09/11 showed kappa:lambda ratio of 255 and free kappa light chain of 30.7 mg/dL compared to  6/96/2952 when  free kappa/lambda ratio was 6,468 and free kappa excretion was 2/070 mg/dL)  ASSESSMENT AND PLAN:   1. Multiple myeloma:  I discussed with Kendra Johns and her son that she continues to show sign of response with decreasing the kappa light chains both in the serum and in the 24 hour urine collection.  She has had no major side effects from chemotherapy Velcade and Cytoxan. She has no severe fatigue, neuropathy, skin rash. I discussed with her that I will refer her back to Dr. Barbaraann Boys to see whether she is a candidate for a second autologous bone marrow transplant given the fact that she has recurrent myeloma. I am cognizant of the fact that she has hepatitis C in the past without evidence of cirrhosis.  For now, I would continue with the current regimen would once a week Velcade 3 weeks on one week off in addition to weekly dexamethasone and Cytoxan.  In the future if she has progression of disease despite the current regimen, I may consider Carfilzomib for salvage therapy.  There is no role to start Carfilzomib  When she is having response to the current regimen of Cytoxan and Velcade. 2.  Prophylaxis:  While she is on chemotherapy, she is on acyclovir 400 mg p.o. b.i.d. to decrease her risk of herpes simplex  virus reactivation.   3. Pain control in the right hip:  Either multiple myeloma versus the DJD versus sciatic nerve. She has a controlled with the current regimen of morphine sulfate MS Contin and breakthrough pain with Opana by Dr. [name]. I appreciate his assistance. 4. Hypertension:  Well controlled on lisinopril per PCP. 5. Followup once a week with labs, CBC and Velcade subcu injection.  I will see her on Oct 05, 2011 before the next cycle of chemo.

## 2011-09-14 NOTE — Patient Instructions (Signed)
1300-Pt discharged ambulatory with next appointment confirmed.  Pt aware to call with any questions or concerns.

## 2011-09-15 ENCOUNTER — Other Ambulatory Visit: Payer: Self-pay | Admitting: *Deleted

## 2011-09-15 MED ORDER — CYCLOPHOSPHAMIDE 50 MG PO TABS: 450.0000 mg | ORAL_TABLET | ORAL | Status: DC

## 2011-09-15 NOTE — Telephone Encounter (Signed)
Faxed refill for cytoxan to [facility] fax#[phone] (ph#[phone])

## 2011-09-16 LAB — SPEP & IFE WITH QIG
Alpha-1-Globulin: 4.7 % (ref 2.9–4.9)
Beta 2: 2.6 % — ABNORMAL LOW (ref 3.2–6.5)
Gamma Globulin: 14.5 % (ref 11.1–18.8)
IgA: 51 mg/dL — ABNORMAL LOW (ref 69–380)
IgM, Serum: 82 mg/dL (ref 52–322)

## 2011-09-21 ENCOUNTER — Other Ambulatory Visit: Payer: Self-pay | Admitting: Oncology

## 2011-09-21 ENCOUNTER — Other Ambulatory Visit (HOSPITAL_BASED_OUTPATIENT_CLINIC_OR_DEPARTMENT_OTHER): Payer: Medicare Other

## 2011-09-21 ENCOUNTER — Ambulatory Visit (HOSPITAL_BASED_OUTPATIENT_CLINIC_OR_DEPARTMENT_OTHER): Payer: Medicare Other

## 2011-09-21 VITALS — BP 150/101 | HR 68 | Temp 97.7°F

## 2011-09-21 DIAGNOSIS — C9000 Multiple myeloma not having achieved remission: Secondary | ICD-10-CM

## 2011-09-21 DIAGNOSIS — Z5112 Encounter for antineoplastic immunotherapy: Secondary | ICD-10-CM

## 2011-09-21 DIAGNOSIS — C9002 Multiple myeloma in relapse: Secondary | ICD-10-CM

## 2011-09-21 LAB — CBC WITH DIFFERENTIAL/PLATELET
Basophils Absolute: 0.1 10*3/uL (ref 0.0–0.1)
EOS%: 4.9 % (ref 0.0–7.0)
Eosinophils Absolute: 0.2 10*3/uL (ref 0.0–0.5)
HGB: 14.9 g/dL (ref 11.6–15.9)
MCH: 31.1 pg (ref 25.1–34.0)
NEUT#: 2.6 10*3/uL (ref 1.5–6.5)
RBC: 4.79 10*6/uL (ref 3.70–5.45)
RDW: 14 % (ref 11.2–14.5)
lymph#: 1.2 10*3/uL (ref 0.9–3.3)

## 2011-09-21 MED ORDER — ONDANSETRON HCL 8 MG PO TABS
8.0000 mg | ORAL_TABLET | Freq: Once | ORAL | Status: AC
  Administered 2011-09-21: 8 mg via ORAL

## 2011-09-21 MED ORDER — BORTEZOMIB CHEMO SQ INJECTION 3.5 MG (2.5MG/ML)
1.3000 mg/m2 | Freq: Once | INTRAMUSCULAR | Status: AC
  Administered 2011-09-21: 2 mg via SUBCUTANEOUS
  Filled 2011-09-21: qty 2

## 2011-09-21 NOTE — Progress Notes (Signed)
Pt. Also states her BP is high d/t her pain, has BP medication at home that she took this morning already.

## 2011-09-21 NOTE — Patient Instructions (Signed)
Call if any problems.  Pt states she has medication for nausea and pain, and is aware of her next appt.

## 2011-09-22 ENCOUNTER — Encounter: Payer: Self-pay | Admitting: Internal Medicine

## 2011-09-28 ENCOUNTER — Ambulatory Visit: Payer: Medicare Other

## 2011-09-28 ENCOUNTER — Other Ambulatory Visit: Payer: Medicare Other | Admitting: Lab

## 2011-09-30 ENCOUNTER — Other Ambulatory Visit: Payer: Self-pay | Admitting: Oncology

## 2011-10-01 ENCOUNTER — Other Ambulatory Visit: Payer: Self-pay | Admitting: Oncology

## 2011-10-01 DIAGNOSIS — K7460 Unspecified cirrhosis of liver: Secondary | ICD-10-CM

## 2011-10-02 ENCOUNTER — Telehealth: Payer: Self-pay | Admitting: Oncology

## 2011-10-02 NOTE — Telephone Encounter (Signed)
gve a copy of the referral for to kim in medical records for the [facility].

## 2011-10-05 ENCOUNTER — Ambulatory Visit (HOSPITAL_BASED_OUTPATIENT_CLINIC_OR_DEPARTMENT_OTHER): Payer: Medicare Other | Admitting: Oncology

## 2011-10-05 ENCOUNTER — Ambulatory Visit (HOSPITAL_BASED_OUTPATIENT_CLINIC_OR_DEPARTMENT_OTHER): Payer: Medicare Other

## 2011-10-05 ENCOUNTER — Telehealth: Payer: Self-pay | Admitting: Oncology

## 2011-10-05 ENCOUNTER — Other Ambulatory Visit (HOSPITAL_BASED_OUTPATIENT_CLINIC_OR_DEPARTMENT_OTHER): Payer: Medicare Other | Admitting: Lab

## 2011-10-05 VITALS — BP 157/99 | HR 84 | Temp 97.9°F | Ht 64.0 in | Wt 129.5 lb

## 2011-10-05 DIAGNOSIS — C9000 Multiple myeloma not having achieved remission: Secondary | ICD-10-CM

## 2011-10-05 DIAGNOSIS — Z5112 Encounter for antineoplastic immunotherapy: Secondary | ICD-10-CM

## 2011-10-05 DIAGNOSIS — I10 Essential (primary) hypertension: Secondary | ICD-10-CM

## 2011-10-05 DIAGNOSIS — C9002 Multiple myeloma in relapse: Secondary | ICD-10-CM

## 2011-10-05 DIAGNOSIS — M25559 Pain in unspecified hip: Secondary | ICD-10-CM

## 2011-10-05 LAB — CBC WITH DIFFERENTIAL/PLATELET
BASO%: 0.5 % (ref 0.0–2.0)
LYMPH%: 18.2 % (ref 14.0–49.7)
MCHC: 34.1 g/dL (ref 31.5–36.0)
MCV: 94.2 fL (ref 79.5–101.0)
MONO%: 6.5 % (ref 0.0–14.0)
Platelets: 190 10*3/uL (ref 145–400)
RBC: 4.96 10*6/uL (ref 3.70–5.45)
WBC: 5.1 10*3/uL (ref 3.9–10.3)

## 2011-10-05 MED ORDER — BORTEZOMIB CHEMO SQ INJECTION 3.5 MG (2.5MG/ML)
1.3000 mg/m2 | Freq: Once | INTRAMUSCULAR | Status: AC
  Administered 2011-10-05: 2 mg via SUBCUTANEOUS
  Filled 2011-10-05: qty 2

## 2011-10-05 MED ORDER — ONDANSETRON HCL 8 MG PO TABS
8.0000 mg | ORAL_TABLET | Freq: Once | ORAL | Status: AC
  Administered 2011-10-05: 8 mg via ORAL

## 2011-10-05 NOTE — Telephone Encounter (Signed)
Pt will be referred to Liver specialist instead of GI in the referral tab. Calendar given to pt.

## 2011-10-05 NOTE — Progress Notes (Signed)
[facility] OFF[facility] PROGRESS [facility]  [name], MD, MD [address] [place] [address]  PAST DIAGNOSIS:  multiple myeloma presented with bony disease and slight increase in her light chain.  Initial diagnosis was in September 2007.  She had presented initially with a plasmacytoma.  Subsequently, she had developed multiple myeloma.  PAST THERAPY: 1.  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  She has received total of 45 Gy in 25 fractions in March 2008. 2.  She received induction therapy with Revlimid and dexamethasone.  Subsequently, received Velcade and dexamethasone until September 2009. 3.  She received high dose chemotherapy and stem cell transplant in September 2009. 4.  She had a complete response.  She has been on active surveillance since that time.  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  CURRENT THERAPY:  started salvage chemo Velcade/Cytoxan/Dexamethasone on 05/05/11.    INTERVAL HISTORY: Kendra Johns 60 y.o. female returns for regular follow up with her son.  Her back pain has been under very good control with pain service.  She uses morphine sulfate  sustained release 60mg  PO TID and opana immediate release for breakthrough.  She has been able to be more functional.  She does not need wheelchair anymore.  She has been able to walk going shopping.  With regards to her chemotherapy, she denies fatigue, mucositis, skin rash, neuropathy, bleeding sx, fever, infections.  Patient denies headache, visual changes, confusion, drenching night sweats, palpable lymph node swelling, mucositis, odynophagia, dysphagia, nausea vomiting, chest pain, palpitation, shortness of breath, dyspnea on exertion, productive cough, gum bleeding, epistaxis, hematemesis, hemoptysis, abdominal pain, abdominal swelling, early satiety, melena, hematochezia, hematuria, skin rash,  spontaneous bleeding, joint swelling, heat or cold intolerance, bowel bladder incontinence, focal motor weakness, paresthesia, depression, suicidal or homocidal ideation, feeling hopelessness.  MEDICAL HISTORY: Past Medical History  Diagnosis Date  . Depression   . Hypertension   . Hepatitis C     genotype 1  . Diverticulosis of colon   . Multiple myeloma     kappa light chain, s/p high-dose chemo/stem cell tx - [facility]  . Hx of adenomatous colonic polyps 2007  . Anxiety   . Osteoarthritis   . Sleep apnea   . Hyperlipidemia   . GERD with stricture   . External hemorrhoids   . Hepatitis B virus infection   . IBS (irritable bowel syndrome)   . Helicobacter pylori gastritis 2001    ? if treated  . Sciatic nerve pain     with Dr. Delma Officer  . Chronic low back pain     ALLERGIES:  is allergic to codeine and ibuprofen.  MEDICATIONS: Current Outpatient Prescriptions  Medication Sig Dispense Refill  . acyclovir (ZOVIRAX) 400 MG tablet Take 400 mg by mouth 2 (two) times daily.        . Bisacodyl (LAXATIVE PO) Take by mouth. As needed       . cyclophosphamide (CYTOXAN) 50 MG tablet Take 9 tablets (450 mg total) by mouth once a week. 9 tablets on empty stomach, 1 hour before or 2 hours after meals.   36 tablet  0  . dexamethasone (DECADRON) 4 MG tablet Take 4 mg by mouth once a week. Take 10 tabs weekly       . diazepam (VALIUM) 5 MG tablet Take 1 tablet (5 mg total) by mouth as needed.  20 tablet  0  .  lisinopril (PRINIVIL,ZESTRIL) 20 MG tablet Take 1 tablet (20 mg total) by mouth daily.  90 tablet  11  . loperamide (IMODIUM) 2 MG capsule Take 2 mg by mouth as needed.        . Magnesium Hydroxide (MILK OF MAGNESIA PO) Take by mouth daily as needed.        Marland Kitchen morphine (MS CONTIN) 60 MG 12 hr tablet Take 60 mg by mouth 3 (three) times daily.       Marland Kitchen oxymorphone (OPANA) 10 MG tablet Take 10 mg by mouth every 6 (six) hours as needed. 1-2 tabs      . polyethylene glycol (MIRALAX /  GLYCOLAX) packet Take 17 g by mouth every other day.        . prochlorperazine (COMPAZINE) 10 MG tablet Take 10 mg by mouth every 6 (six) hours as needed.        . Sodium Phosphates (ENEMA RE) Place rectally. As needed         SURGICAL HISTORY:  Past Surgical History  Procedure Date  . Abdominal hysterectomy   . Oophorectomy   . Dilation and curettage of uterus   . Bone marrow transplant   . Appendectomy   . Colonoscopy w/ polypectomy 08/2006    1-2 adenomas, 6 polyps total, diverticulosis and external hemorrhoids  . Upper gastrointestinal endoscopy 08/2003    esophageal stricture dilation, hiatal hernia, gastrritis  . Cholecystectomy   . Tonsillectomy and adenoidectomy     REVIEW OF SYSTEMS:  Pertinent items are noted in HPI.   Filed Vitals:   10/05/11 1022  BP: 157/99  Pulse: 84  Temp: 97.9 F (36.6 C)   Wt Readings from Last 3 Encounters:  10/05/11 129 lb 8 oz (58.741 kg)  09/14/11 131 lb 1.6 oz (59.467 kg)  09/07/11 129 lb 9 oz (58.769 kg)    PHYSICAL EXAMINATION:   She was able to get on and off the exam table by herself.  There was no pain upon lying down.   General:  well-nourished in no acute distress.  Eyes:  no scleral icterus.  ENT:  There were no oropharyngeal lesions.  Neck was without thyromegaly.  Lymphatics:  Negative cervical, supraclavicular or axillary adenopathy.  Respiratory: lungs were clear bilaterally without wheezing or crackles.  Cardiovascular:  Regular rate and rhythm, S1/S2, without murmur, rub or gallop.  There was no pedal edema.  GI:  abdomen was soft, flat, nontender, nondistended, without organomegaly.  Muscoloskeletal:  no spinal tenderness of palpation of vertebral spine.  Skin exam was without echymosis, petichae.  Neuro exam was nonfocal.  Patient was able to get on and off exam table without assistance.  Gait was normal.  Patient was alerted and oriented.  Attention was good.   Language was appropriate.  Mood was normal without depression.   Speech was not pressured.  Thought content was not tangential.     LABORATORY/RADIOLOGY DATA:  Lab Results  Component Value Date   WBC 5.1 10/05/2011   HGB 15.9 10/05/2011   HCT 46.7* 10/05/2011   PLT 190 10/05/2011   GLUCOSE 119* 08/10/2011   GLUCOSE 119* 08/10/2011   CHOL 240* 02/03/2011   TRIG 93.0 02/03/2011   HDL 43.00 02/03/2011   LDLDIRECT 176.9 02/03/2011   LDLCALC  Value: 112        Total Cholesterol/HDL:CHD Risk Coronary Heart Disease Risk Table                     Men  Women  1/2 Average Risk   3.4   3.3* 11/26/2007   ALT 33 08/10/2011   ALT 33 08/10/2011   AST 34 08/10/2011   AST 34 08/10/2011   NA 140 08/10/2011   NA 140 08/10/2011   K 4.9 08/10/2011   K 4.9 08/10/2011   CL 106 08/10/2011   CL 106 08/10/2011   CREATININE 0.85 08/10/2011   CREATININE 0.85 08/10/2011   BUN 10 08/10/2011   BUN 10 08/10/2011   CO2 24 08/10/2011   CO2 24 08/10/2011   TSH 2.01 02/03/2011   INR 1.0 03/12/2008   HGBA1C  Value: 5.8 (NOTE)   The ADA recommends the following therapeutic goals for glycemic   control related to Hgb A1C measurement:   Goal of Therapy:   < 7.0% Hgb A1C   Action Suggested:  > 8.0% Hgb A1C   Ref:  Diabetes Care, 22, Suppl. 1, 1999 11/26/2007    M-spike and serum free light chain pending.   ASSESSMENT AND PLAN:   1. Multiple myeloma:  She is doing very well with Velcade/Cytoxan/Dex.  She has been having very good response with down trending of M-spike and light chains. She has not had any dose limiting toxicity.  However, still, she has recurrent myeloma.  I discussed the case with Dr. Barbaraann Boys, [facility].  We are in the process at evaluating whether Ms. Alviar is a candidate for a 2nd autologous stem cell transplant.  This is contingent on her hepatic function, and whether there is enough stem cell frozen from the 1st transplant, and if not, whether she has enough bone marrow reserve for a 2nd bone marrow harvest.  For now, until these issues are being considered, I will continue with her  current chemo regimen.  2. History of cirrhosis and hepatitis C:  Diagnosed in 2009 with [facility] when she underwent the 1st transplant evaluation.  She is compensated without encephalopathy, ascites, bleeding, cytopenia.  However, at the request of Dr. Barbaraann Boys, I referred patient to [facility] [place] [facility] again within the next 1-2 weeks for re-evaluation.  3. Prophylaxis:  While she is on chemotherapy, she is on acyclovir 400 mg p.o. b.i.d. to decrease her risk of herpes simplex virus reactivation.   4. Pain control in the right hip:  Either multiple myeloma versus the DJD versus sciatic nerve. She has a controlled with the current regimen of morphine sulfate MS Contin and breakthrough pain with Opana by Dr. [name]. I appreciate his assistance. 5. Hypertension:  Well controlled on lisinopril per PCP. 6. Followup once a week with labs, CBC and Velcade subcu injection.  I will see her on Nov 02, 2011 before the next cycle of chemo.

## 2011-10-07 LAB — COMPREHENSIVE METABOLIC PANEL
ALT: 17 U/L (ref 0–35)
AST: 28 U/L (ref 0–37)
Alkaline Phosphatase: 57 U/L (ref 39–117)
Sodium: 140 mEq/L (ref 135–145)
Total Bilirubin: 0.6 mg/dL (ref 0.3–1.2)
Total Protein: 7.2 g/dL (ref 6.0–8.3)

## 2011-10-07 LAB — PROTEIN ELECTROPHORESIS, SERUM
Albumin ELP: 60.1 % (ref 55.8–66.1)
Alpha-1-Globulin: 4.4 % (ref 2.9–4.9)
Beta 2: 2.7 % — ABNORMAL LOW (ref 3.2–6.5)
Total Protein, Serum Electrophoresis: 7.2 g/dL (ref 6.0–8.3)

## 2011-10-07 LAB — KAPPA/LAMBDA LIGHT CHAINS: Lambda Free Lght Chn: 0.84 mg/dL (ref 0.57–2.63)

## 2011-10-12 ENCOUNTER — Other Ambulatory Visit (HOSPITAL_BASED_OUTPATIENT_CLINIC_OR_DEPARTMENT_OTHER): Payer: Medicare Other | Admitting: Lab

## 2011-10-12 ENCOUNTER — Ambulatory Visit (HOSPITAL_BASED_OUTPATIENT_CLINIC_OR_DEPARTMENT_OTHER): Payer: Medicare Other

## 2011-10-12 VITALS — BP 137/95 | HR 75 | Temp 98.0°F

## 2011-10-12 DIAGNOSIS — Z5112 Encounter for antineoplastic immunotherapy: Secondary | ICD-10-CM

## 2011-10-12 DIAGNOSIS — C9000 Multiple myeloma not having achieved remission: Secondary | ICD-10-CM

## 2011-10-12 DIAGNOSIS — C9002 Multiple myeloma in relapse: Secondary | ICD-10-CM

## 2011-10-12 LAB — CBC WITH DIFFERENTIAL/PLATELET
EOS%: 4.3 % (ref 0.0–7.0)
Eosinophils Absolute: 0.2 10*3/uL (ref 0.0–0.5)
LYMPH%: 20.1 % (ref 14.0–49.7)
MCH: 32.6 pg (ref 25.1–34.0)
MCV: 94.3 fL (ref 79.5–101.0)
MONO%: 11.7 % (ref 0.0–14.0)
NEUT#: 3.3 10*3/uL (ref 1.5–6.5)
Platelets: 146 10*3/uL (ref 145–400)
RBC: 4.68 10*6/uL (ref 3.70–5.45)
RDW: 15.6 % — ABNORMAL HIGH (ref 11.2–14.5)

## 2011-10-12 MED ORDER — BORTEZOMIB CHEMO SQ INJECTION 3.5 MG (2.5MG/ML)
1.3000 mg/m2 | Freq: Once | INTRAMUSCULAR | Status: AC
  Administered 2011-10-12: 2 mg via SUBCUTANEOUS
  Filled 2011-10-12: qty 2

## 2011-10-12 MED ORDER — ONDANSETRON HCL 8 MG PO TABS
8.0000 mg | ORAL_TABLET | Freq: Once | ORAL | Status: AC
  Administered 2011-10-12: 8 mg via ORAL

## 2011-10-17 ENCOUNTER — Other Ambulatory Visit: Payer: Self-pay | Admitting: Oncology

## 2011-10-17 DIAGNOSIS — C9000 Multiple myeloma not having achieved remission: Secondary | ICD-10-CM

## 2011-10-19 ENCOUNTER — Other Ambulatory Visit (HOSPITAL_BASED_OUTPATIENT_CLINIC_OR_DEPARTMENT_OTHER): Payer: Medicare Other | Admitting: Lab

## 2011-10-19 ENCOUNTER — Ambulatory Visit (HOSPITAL_BASED_OUTPATIENT_CLINIC_OR_DEPARTMENT_OTHER): Payer: Medicare Other

## 2011-10-19 VITALS — BP 139/100 | HR 81 | Temp 99.0°F

## 2011-10-19 DIAGNOSIS — C9002 Multiple myeloma in relapse: Secondary | ICD-10-CM

## 2011-10-19 DIAGNOSIS — C9000 Multiple myeloma not having achieved remission: Secondary | ICD-10-CM

## 2011-10-19 DIAGNOSIS — Z5112 Encounter for antineoplastic immunotherapy: Secondary | ICD-10-CM

## 2011-10-19 LAB — BASIC METABOLIC PANEL
BUN: 15 mg/dL (ref 6–23)
CO2: 25 mEq/L (ref 19–32)
Chloride: 108 mEq/L (ref 96–112)
Creatinine, Ser: 0.84 mg/dL (ref 0.50–1.10)

## 2011-10-19 LAB — CBC WITH DIFFERENTIAL/PLATELET
BASO%: 0.5 % (ref 0.0–2.0)
LYMPH%: 21.3 % (ref 14.0–49.7)
MCHC: 34.5 g/dL (ref 31.5–36.0)
MCV: 94.3 fL (ref 79.5–101.0)
MONO%: 5.9 % (ref 0.0–14.0)
Platelets: 157 10*3/uL (ref 145–400)
RBC: 4.88 10*6/uL (ref 3.70–5.45)
RDW: 16 % — ABNORMAL HIGH (ref 11.2–14.5)
WBC: 5.7 10*3/uL (ref 3.9–10.3)

## 2011-10-19 MED ORDER — BORTEZOMIB CHEMO SQ INJECTION 3.5 MG (2.5MG/ML)
1.3000 mg/m2 | Freq: Once | INTRAMUSCULAR | Status: AC
  Administered 2011-10-19: 2 mg via SUBCUTANEOUS
  Filled 2011-10-19: qty 2

## 2011-10-19 MED ORDER — ONDANSETRON HCL 8 MG PO TABS
8.0000 mg | ORAL_TABLET | Freq: Once | ORAL | Status: AC
  Administered 2011-10-19: 8 mg via ORAL

## 2011-10-19 NOTE — Patient Instructions (Addendum)
10/19/11 1238- Pt did take BP medication this morning.  Pt states that her BP has been high for the past six months.  Instructed pt to recheck BP at home and to contact PCP if it increases.  Pt verbalized understanding.  Pt denies any headaches or feelings of lightheaded. Pt discharged ambulatory with next appointment confirmed.  Pt aware to call with any questions or concerns.

## 2011-11-02 ENCOUNTER — Telehealth: Payer: Self-pay | Admitting: Oncology

## 2011-11-02 ENCOUNTER — Ambulatory Visit (HOSPITAL_BASED_OUTPATIENT_CLINIC_OR_DEPARTMENT_OTHER): Payer: Medicare Other | Admitting: Oncology

## 2011-11-02 ENCOUNTER — Ambulatory Visit (HOSPITAL_BASED_OUTPATIENT_CLINIC_OR_DEPARTMENT_OTHER): Payer: Medicare Other

## 2011-11-02 ENCOUNTER — Other Ambulatory Visit (HOSPITAL_BASED_OUTPATIENT_CLINIC_OR_DEPARTMENT_OTHER): Payer: Medicare Other | Admitting: Lab

## 2011-11-02 DIAGNOSIS — B1920 Unspecified viral hepatitis C without hepatic coma: Secondary | ICD-10-CM

## 2011-11-02 DIAGNOSIS — C9002 Multiple myeloma in relapse: Secondary | ICD-10-CM

## 2011-11-02 DIAGNOSIS — K7460 Unspecified cirrhosis of liver: Secondary | ICD-10-CM

## 2011-11-02 DIAGNOSIS — C9000 Multiple myeloma not having achieved remission: Secondary | ICD-10-CM

## 2011-11-02 DIAGNOSIS — Z5112 Encounter for antineoplastic immunotherapy: Secondary | ICD-10-CM

## 2011-11-02 LAB — CBC WITH DIFFERENTIAL/PLATELET
BASO%: 0.5 % (ref 0.0–2.0)
EOS%: 5.9 % (ref 0.0–7.0)
HCT: 45.9 % (ref 34.8–46.6)
LYMPH%: 12.3 % — ABNORMAL LOW (ref 14.0–49.7)
MCH: 32.8 pg (ref 25.1–34.0)
MCHC: 34.7 g/dL (ref 31.5–36.0)
MCV: 94.5 fL (ref 79.5–101.0)
MONO%: 10.6 % (ref 0.0–14.0)
NEUT%: 70.7 % (ref 38.4–76.8)
lymph#: 0.6 10*3/uL — ABNORMAL LOW (ref 0.9–3.3)

## 2011-11-02 MED ORDER — ONDANSETRON HCL 8 MG PO TABS
8.0000 mg | ORAL_TABLET | Freq: Once | ORAL | Status: AC
  Administered 2011-11-02: 8 mg via ORAL

## 2011-11-02 MED ORDER — BORTEZOMIB CHEMO SQ INJECTION 3.5 MG (2.5MG/ML)
1.3000 mg/m2 | Freq: Once | INTRAMUSCULAR | Status: AC
  Administered 2011-11-02: 2 mg via SUBCUTANEOUS
  Filled 2011-11-02: qty 2

## 2011-11-02 NOTE — Telephone Encounter (Signed)
appts made for lab and rx for 1/28,2/4 and 2/11.  md visit also on 11/30/11,michelle to print for pt    aom

## 2011-11-02 NOTE — Progress Notes (Signed)
[facility] OFFICE PROGRESS NOTE  [name], MD, MD  [address] [place] [address]    PAST DIAGNOSIS: multiple myeloma presented with bony disease and slight increase in her light chain. Initial diagnosis was in September 2007. She had presented initially with a plasmacytoma. Subsequently, she had developed multiple myeloma.   PAST THERAPY:  1. 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 She has received total of 45 Gy in 25 fractions in March 2008.  2. She received induction therapy with Revlimid and dexamethasone. Subsequently, received Velcade and dexamethasone until September 2009.  3. She received high dose chemotherapy and stem cell transplant in September 2009.  4. She had a complete response. She has been on active surveillance since that time.   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   CURRENT THERAPY: started salvage chemo Velcade/Cytoxan/Dexamethasone on 05/05/11.   INTERVAL HISTORY:  MORAYMA GODOWN 60 y.o. female returns for regular follow up .Her back pain has worsened around 12/28 She uses morphine sulfate sustained release 60mg  PO TID and opana immediate release for breakthrough without significant improvement. She states her pain extends to the R hip and causes numbness in the R thigh, and "cold feeling in the legs and feet".With regards to her chemotherapy, she has been more fatigued over the last week , She has been using mouthwash to treat her "sore tongue". She has developed intermittent skin irritation and itching on the external pubic area.  No nosebleeding sx, fever, infections.  Patient denies headache, visual changes, confusion, drenching night sweats, palpable lymph node swelling, mucositis, odynophagia, dysphagia, nausea vomiting, chest pain, palpitation, shortness of breath, dyspnea on exertion, productive cough, gum bleeding, epistaxis,  hematemesis, hemoptysis, abdominal pain, abdominal swelling, early satiety, melena, hematochezia, hematuria, spontaneous bleeding, joint swelling, heat or cold intolerance, bowel bladder incontinence, focal motor weakness, paresthesia, depression, suicidal or homicidal ideation, feeling hopelessness.   MEDICAL HISTORY: Past Medical History  Diagnosis Date  . Depression   . Hypertension   . Hepatitis C     genotype 1  . Diverticulosis of colon   . Multiple myeloma     kappa light chain, s/p high-dose chemo/stem cell tx - [facility]  . Hx of adenomatous colonic polyps 2007  . Anxiety   . Osteoarthritis   . Sleep apnea   . Hyperlipidemia   . GERD with stricture   . External hemorrhoids   . Hepatitis B virus infection   . IBS (irritable bowel syndrome)   . Helicobacter pylori gastritis 2001    ? if treated  . Sciatic nerve pain     with Dr. Delma Officer  . Chronic low back pain     SURGICAL HISTORY:  Past Surgical History  Procedure Date  . Abdominal hysterectomy   . Oophorectomy   . Dilation and curettage of uterus   . Bone marrow transplant   . Appendectomy   . Colonoscopy w/ polypectomy 08/2006    1-2 adenomas, 6 polyps total, diverticulosis and external hemorrhoids  . Upper gastrointestinal endoscopy 08/2003    esophageal stricture dilation, hiatal hernia, gastrritis  . Cholecystectomy   . Tonsillectomy and adenoidectomy     MEDICATIONS: Current Outpatient Prescriptions  Medication Sig Dispense Refill  . acyclovir (ZOVIRAX) 400 MG tablet Take 400 mg by mouth 2 (two) times daily.        . Bisacodyl (LAXATIVE PO) Take by mouth. As needed       .  cyclophosphamide (CYTOXAN) 50 MG tablet Take 9 tablets (450 mg total) by mouth once a week. 9 tablets on empty stomach, 1 hour before or 2 hours after meals.   36 tablet  0  . dexamethasone (DECADRON) 4 MG tablet TAKE 10 TABLETS ONCE A WEEK  100 tablet  3  . diazepam (VALIUM) 5 MG tablet Take 1 tablet (5 mg total) by mouth as  needed.  20 tablet  0  . gabapentin (NEURONTIN) 600 MG tablet Take 600 mg by mouth 3 (three) times daily.        Marland Kitchen lisinopril (PRINIVIL,ZESTRIL) 20 MG tablet Take 1 tablet (20 mg total) by mouth daily.  90 tablet  11  . loperamide (IMODIUM) 2 MG capsule Take 2 mg by mouth as needed.        . Magnesium Hydroxide (MILK OF MAGNESIA PO) Take by mouth daily as needed.        Marland Kitchen morphine (MS CONTIN) 60 MG 12 hr tablet Take 60 mg by mouth 3 (three) times daily.       Marland Kitchen oxymorphone (OPANA) 10 MG tablet Take 10 mg by mouth every 6 (six) hours as needed. 1-2 tabs      . polyethylene glycol (MIRALAX / GLYCOLAX) packet Take 17 g by mouth every other day.        . prochlorperazine (COMPAZINE) 10 MG tablet Take 10 mg by mouth every 6 (six) hours as needed.        . Sodium Phosphates (ENEMA RE) Place rectally. As needed         ALLERGIES:  is allergic to codeine and ibuprofen.  REVIEW OF SYSTEMS:  The rest of the 14-point review of system was negative.   Filed Vitals:   11/02/11 0930  BP: 141/89  Pulse: 75  Temp: 97.8 F (36.6 C)   Wt Readings from Last 3 Encounters:  11/02/11 132 lb 12.8 oz (60.238 kg)  10/05/11 129 lb 8 oz (58.741 kg)  09/14/11 131 lb 1.6 oz (59.467 kg)   ECOG Performance status:   PHYSICAL EXAMINATION:   She was able to get on and off the exam table by herself. There was pain upon lying down. General: well-nourished in no acute distress. Eyes: no scleral icterus. ENT: There were no oropharyngeal lesions.No thrush. Neck was without thyromegaly. Lymphatics: Negative cervical, supraclavicular or axillary adenopathy. Respiratory: lungs were clear bilaterally without wheezing or crackles. Cardiovascular: Regular rate and rhythm, S1/S2, without murmur, rub or gallop. There was no pedal edema. GI: abdomen was soft, flat, nontender, nondistended, without organomegaly. Muscoloskeletal: no spinal tenderness of palpation of vertebral spine. Skin exam was without echymosis, petichae. Neuro  exam was nonfocal. Patient was able to get on and off exam table without assistance.Quenten Raven is reproducible tenderness on the R sacral to hip region. Gait was normal. Patient was alerted and oriented. Attention was good. Language was appropriate. Mood was normal without depression. Speech was not pressured. Thought content was not tangential.       LABORATORY/RADIOLOGY DATA:   Lab 11/02/11 0905  WBC 5.3  HGB 15.9  HCT 45.9  PLT 174  MCV 94.5  MCH 32.8  MCHC 34.7  RDW 17.1*  LYMPHSABS 0.6*  MONOABS 0.6  EOSABS 0.3  BASOSABS 0.0  BANDABS --    CMP   No results found for this basename: NA:5,K:5,CL:5,CO2:5,GLUCOSE:5,BUN:5,CREATININE:5,GFRCGP,:5,CALCIUM:5,MG:5,AST:5,ALT:5,ALKPHOS:5,BILITOT:5 in the last 168 hours      Component Value Date/Time   BILITOT 0.6 10/05/2011 1008   BILIDIR 0.1 02/03/2011 0936  Radiology Studies:  No results found.     ASSESSMENT AND PLAN:   1. Multiple myeloma: She is  on Velcade/Cytoxan/Dex. She has been having very good response with down trending of M-spike and light chains. Due to he recurrent myeloma Dr Gaylyn Rong discussed the case with Dr. Barbaraann Boys, [facility]. We are in the process at evaluating whether Ms. Gassen is a candidate for a 2nd autologous stem cell transplant. This is contingent on her hepatic function, and whether there is enough stem cell frozen from the 1st transplant, and if not, whether she has enough bone marrow reserve for a 2nd bone marrow harvest. For now, until these issues are being considered, I will continue with her current chemo regimen.  2. History of cirrhosis and hepatitis C: Diagnosed in 2009 with [facility] when she underwent the 1st transplant evaluation. She is compensated without encephalopathy, ascites, bleeding, cytopenia. She is to see  Dr. Barbaraann Boys, at [facility] clinic again within the next 1-2 weeks for re-evaluation. Pt has scheduled CTs and Liver function tests prior to the visit.  3. Prophylaxis:  While she is on chemotherapy, she is on acyclovir 400 mg p.o. b.i.d. to decrease her risk of herpes simplex virus reactivation.  4. Pain control in the right hip: Either multiple myeloma versus the DJD versus sciatic nerve. She has recurrence of symptoms over the last 4 days.  Current regimen of morphine sulfate MS Contin and breakthrough pain with Opana by Dr. [name]. She has been instructed to call her clinic for regimen management.  I appreciate his assistance. 5. Fatigue:  Likely secondary to chemo, pain medications and decrease in activity. However, will check TSH levels.  6. Hypertension: Well controlled on lisinopril per PCP. 7. Pubic pruritus: pt has been instructed to call PCP regarding these symptoms.  8. Followup once a week with labs, CBC and Velcade subcu injection. She is to see Dr. Gaylyn Rong as scheduled.   Dr. Gaylyn Rong has spent 95 percent of the face to face encounter and has answered all  her questions and concerns.

## 2011-11-05 LAB — PROTEIN ELECTROPHORESIS, SERUM
Alpha-1-Globulin: 4.8 % (ref 2.9–4.9)
Beta 2: 2.6 % — ABNORMAL LOW (ref 3.2–6.5)
Beta Globulin: 6.3 % (ref 4.7–7.2)
Gamma Globulin: 13.5 % (ref 11.1–18.8)

## 2011-11-05 LAB — COMPREHENSIVE METABOLIC PANEL
ALT: 24 U/L (ref 0–35)
AST: 29 U/L (ref 0–37)
Alkaline Phosphatase: 66 U/L (ref 39–117)
BUN: 10 mg/dL (ref 6–23)
Chloride: 103 mEq/L (ref 96–112)
Creatinine, Ser: 0.76 mg/dL (ref 0.50–1.10)
Total Bilirubin: 0.7 mg/dL (ref 0.3–1.2)

## 2011-11-05 LAB — KAPPA/LAMBDA LIGHT CHAINS: Kappa:Lambda Ratio: 3.34 — ABNORMAL HIGH (ref 0.26–1.65)

## 2011-11-09 ENCOUNTER — Other Ambulatory Visit (HOSPITAL_BASED_OUTPATIENT_CLINIC_OR_DEPARTMENT_OTHER): Payer: Medicare Other | Admitting: Lab

## 2011-11-09 ENCOUNTER — Ambulatory Visit (HOSPITAL_BASED_OUTPATIENT_CLINIC_OR_DEPARTMENT_OTHER): Payer: Medicare Other

## 2011-11-09 VITALS — BP 139/95 | HR 74 | Temp 98.0°F

## 2011-11-09 DIAGNOSIS — C9000 Multiple myeloma not having achieved remission: Secondary | ICD-10-CM

## 2011-11-09 DIAGNOSIS — Z5112 Encounter for antineoplastic immunotherapy: Secondary | ICD-10-CM

## 2011-11-09 DIAGNOSIS — C9002 Multiple myeloma in relapse: Secondary | ICD-10-CM

## 2011-11-09 LAB — CBC WITH DIFFERENTIAL/PLATELET
EOS%: 6.4 % (ref 0.0–7.0)
LYMPH%: 21.3 % (ref 14.0–49.7)
MCH: 32.2 pg (ref 25.1–34.0)
MCV: 91.7 fL (ref 79.5–101.0)
MONO%: 7.7 % (ref 0.0–14.0)
Platelets: 174 10*3/uL (ref 145–400)
RBC: 4.57 10*6/uL (ref 3.70–5.45)
RDW: 15.9 % — ABNORMAL HIGH (ref 11.2–14.5)
nRBC: 0 % (ref 0–0)

## 2011-11-09 MED ORDER — ONDANSETRON HCL 8 MG PO TABS
8.0000 mg | ORAL_TABLET | Freq: Once | ORAL | Status: AC
  Administered 2011-11-09: 8 mg via ORAL

## 2011-11-09 MED ORDER — BORTEZOMIB CHEMO SQ INJECTION 3.5 MG (2.5MG/ML)
1.3000 mg/m2 | Freq: Once | INTRAMUSCULAR | Status: AC
  Administered 2011-11-09: 2 mg via SUBCUTANEOUS
  Filled 2011-11-09: qty 2

## 2011-11-09 NOTE — Patient Instructions (Signed)
Pt aware of next appt  

## 2011-11-16 ENCOUNTER — Other Ambulatory Visit: Payer: Medicare Other | Admitting: Lab

## 2011-11-16 ENCOUNTER — Ambulatory Visit (HOSPITAL_BASED_OUTPATIENT_CLINIC_OR_DEPARTMENT_OTHER): Payer: Medicare Other

## 2011-11-16 VITALS — BP 150/107 | HR 74 | Temp 97.8°F

## 2011-11-16 DIAGNOSIS — C9000 Multiple myeloma not having achieved remission: Secondary | ICD-10-CM

## 2011-11-16 LAB — CBC WITH DIFFERENTIAL/PLATELET
EOS%: 4.3 % (ref 0.0–7.0)
Eosinophils Absolute: 0.3 10*3/uL (ref 0.0–0.5)
LYMPH%: 17.2 % (ref 14.0–49.7)
MCH: 31.9 pg (ref 25.1–34.0)
MCHC: 34.4 g/dL (ref 31.5–36.0)
MCV: 92.8 fL (ref 79.5–101.0)
MONO%: 9.9 % (ref 0.0–14.0)
NEUT#: 4.3 10*3/uL (ref 1.5–6.5)
Platelets: 188 10*3/uL (ref 145–400)
RBC: 4.73 10*6/uL (ref 3.70–5.45)
nRBC: 0 % (ref 0–0)

## 2011-11-16 MED ORDER — ONDANSETRON HCL 8 MG PO TABS
8.0000 mg | ORAL_TABLET | Freq: Once | ORAL | Status: AC
  Administered 2011-11-16: 8 mg via ORAL

## 2011-11-16 MED ORDER — BORTEZOMIB CHEMO SQ INJECTION 3.5 MG (2.5MG/ML)
1.3000 mg/m2 | Freq: Once | INTRAMUSCULAR | Status: AC
  Administered 2011-11-16: 2 mg via SUBCUTANEOUS
  Filled 2011-11-16: qty 2

## 2011-11-16 NOTE — Patient Instructions (Signed)
Pt discharged via ambulatory  with Daughter. Pt to call with concerns

## 2011-11-20 ENCOUNTER — Telehealth: Payer: Self-pay | Admitting: *Deleted

## 2011-11-20 ENCOUNTER — Other Ambulatory Visit: Payer: Self-pay | Admitting: *Deleted

## 2011-11-20 MED ORDER — DIPHENHYD-HYDROCORT-NYSTATIN MT SUSP: 15.0000 mL | Freq: Four times a day (QID) | OROMUCOSAL | Status: DC | PRN

## 2011-11-20 NOTE — Telephone Encounter (Signed)
THIS REQUEST WAS GIVEN TO DR.HA'S NURSE, CAMEO PORTER,RN.

## 2011-11-20 NOTE — Telephone Encounter (Signed)
Called pt to check on status of her Cytoxan pills.  She states she just got a refill and has enough for next 3 weeks.  Pt has appt at [facility] on 11/25/11 and then to see Dr. Gaylyn Rong again on 11/30/11.   Pt c/o mouth soreness,  Especially on her tongue.  States she mentioned this to dr. Gaylyn Rong on her last visit and is out of the mouthwash she got from [place].  Order for Magic Mouthwash sent to pt's pharmacy per Dr. Gaylyn Rong.

## 2011-11-26 ENCOUNTER — Encounter: Payer: Self-pay | Admitting: Internal Medicine

## 2011-11-26 ENCOUNTER — Ambulatory Visit (INDEPENDENT_AMBULATORY_CARE_PROVIDER_SITE_OTHER): Payer: Medicare Other | Admitting: Internal Medicine

## 2011-11-26 DIAGNOSIS — Z9481 Bone marrow transplant status: Secondary | ICD-10-CM

## 2011-11-26 DIAGNOSIS — I10 Essential (primary) hypertension: Secondary | ICD-10-CM

## 2011-11-26 DIAGNOSIS — C9000 Multiple myeloma not having achieved remission: Secondary | ICD-10-CM

## 2011-11-26 MED ORDER — DIAZEPAM 5 MG PO TABS: 5.0000 mg | ORAL_TABLET | ORAL | Status: DC | PRN

## 2011-11-26 MED ORDER — AMLODIPINE BESYLATE 5 MG PO TABS: 5.0000 mg | ORAL_TABLET | Freq: Every day | ORAL | Status: DC

## 2011-11-26 NOTE — Assessment & Plan Note (Signed)
Not as well controlled Add amlodipine

## 2011-11-26 NOTE — Progress Notes (Signed)
Patient ID: Kendra Johns, female   DOB: 09/29/1951, 61 y.o.   MRN: [id number] Complicated patient with MM. Scheduled for stem cell transplant in February (1st transplant in 2009)  BP elevated 140s-170s/90-100  Past Medical History  Diagnosis Date  . Depression   . Hypertension   . Hepatitis C     genotype 1  . Diverticulosis of colon   . Multiple myeloma     kappa light chain, s/p high-dose chemo/stem cell tx - [facility]  . Hx of adenomatous colonic polyps 2007  . Anxiety   . Osteoarthritis   . Sleep apnea   . Hyperlipidemia   . GERD with stricture   . External hemorrhoids   . Hepatitis B virus infection   . IBS (irritable bowel syndrome)   . Helicobacter pylori gastritis 2001    ? if treated  . Sciatic nerve pain     with Dr. Delma Officer  . Chronic low back pain     History   Social History  . Marital Status: Married    Spouse Name: N/A    Number of Children: N/A  . Years of Education: N/A   Occupational History  . Not on file.   Social History Main Topics  . Smoking status: Current Everyday Smoker -- 0.3 packs/day for 40 years    Types: Cigarettes  . Smokeless tobacco: Not on file  . Alcohol Use: No  . Drug Use: No  . Sexually Active: Not on file   Other Topics Concern  . Not on file   Social History Narrative  . No narrative on file    Past Surgical History  Procedure Date  . Abdominal hysterectomy   . Oophorectomy   . Dilation and curettage of uterus   . Bone marrow transplant   . Appendectomy   . Colonoscopy w/ polypectomy 08/2006    1-2 adenomas, 6 polyps total, diverticulosis and external hemorrhoids  . Upper gastrointestinal endoscopy 08/2003    esophageal stricture dilation, hiatal hernia, gastrritis  . Cholecystectomy   . Tonsillectomy and adenoidectomy     Family History  Problem Relation Age of Onset  . Alcohol abuse    . Lung cancer Father   . Leukemia Mother   . Cirrhosis Sister   . Colon cancer Neg Hx     Allergies  Allergen  Reactions  . Codeine   . Ibuprofen     REACTION: gi    Current Outpatient Prescriptions on File Prior to Visit  Medication Sig Dispense Refill  . acyclovir (ZOVIRAX) 400 MG tablet Take 400 mg by mouth 2 (two) times daily.        . Bisacodyl (LAXATIVE PO) Take by mouth. As needed       . cyclophosphamide (CYTOXAN) 50 MG tablet Take 9 tablets (450 mg total) by mouth once a week. 9 tablets on empty stomach, 1 hour before or 2 hours after meals.   36 tablet  0  . dexamethasone (DECADRON) 4 MG tablet TAKE 10 TABLETS ONCE A WEEK  100 tablet  3  . Diphenhyd-Hydrocort-Nystatin SUSP Use as directed 15 mLs in the mouth or throat 4 (four) times daily as needed (Swish and spit as needed for mouth soreness).  500 mL  2  . gabapentin (NEURONTIN) 600 MG tablet Take 600 mg by mouth 3 (three) times daily.        Marland Kitchen lisinopril (PRINIVIL,ZESTRIL) 20 MG tablet Take 1 tablet (20 mg total) by mouth daily.  90 tablet  11  .  loperamide (IMODIUM) 2 MG capsule Take 2 mg by mouth as needed.        . Magnesium Hydroxide (MILK OF MAGNESIA PO) Take by mouth daily as needed.        Marland Kitchen morphine (MS CONTIN) 60 MG 12 hr tablet Take 60 mg by mouth 3 (three) times daily.       Marland Kitchen oxymorphone (OPANA) 10 MG tablet Take 10 mg by mouth every 6 (six) hours as needed. 1-2 tabs      . polyethylene glycol (MIRALAX / GLYCOLAX) packet Take 17 g by mouth every other day.        . prochlorperazine (COMPAZINE) 10 MG tablet Take 10 mg by mouth every 6 (six) hours as needed.        . Sodium Phosphates (ENEMA RE) Place rectally. As needed          patient denies chest pain, shortness of breath, orthopnea. Denies lower extremity edema, abdominal pain, change in appetite, change in bowel movements. Patient denies rashes, musculoskeletal complaints. No other specific complaints in a complete review of systems.   BP 142/88  Pulse 88  Temp(Src) 98.2 F (36.8 C) (Oral)  Resp 12  Wt 134 lb (60.782 kg)  Well-developed well-nourished female in no  acute distress. HEENT exam atraumatic, normocephalic, extraocular muscles are intact. Neck is supple. No jugular venous distention no thyromegaly. Chest clear to auscultation without increased work of breathing. Cardiac exam S1 and S2 are regular. Abdominal exam active bowel sounds, soft, nontender. Extremities no edema. Walks slowly and deliberately.

## 2011-11-26 NOTE — Assessment & Plan Note (Signed)
She is scheduled for transplant due to recurrence of dzs.

## 2011-11-30 ENCOUNTER — Telehealth: Payer: Self-pay | Admitting: Oncology

## 2011-11-30 ENCOUNTER — Other Ambulatory Visit (HOSPITAL_BASED_OUTPATIENT_CLINIC_OR_DEPARTMENT_OTHER): Payer: Medicare Other | Admitting: Lab

## 2011-11-30 ENCOUNTER — Ambulatory Visit (HOSPITAL_BASED_OUTPATIENT_CLINIC_OR_DEPARTMENT_OTHER): Payer: Medicare Other

## 2011-11-30 ENCOUNTER — Ambulatory Visit: Payer: Medicare Other | Admitting: Oncology

## 2011-11-30 VITALS — BP 133/89 | HR 83 | Temp 97.7°F | Ht 64.0 in

## 2011-11-30 DIAGNOSIS — C9000 Multiple myeloma not having achieved remission: Secondary | ICD-10-CM

## 2011-11-30 DIAGNOSIS — Z5111 Encounter for antineoplastic chemotherapy: Secondary | ICD-10-CM

## 2011-11-30 LAB — CBC WITH DIFFERENTIAL/PLATELET
BASO%: 1.4 % (ref 0.0–2.0)
EOS%: 5 % (ref 0.0–7.0)
HCT: 47.5 % — ABNORMAL HIGH (ref 34.8–46.6)
LYMPH%: 18.6 % (ref 14.0–49.7)
MCH: 32.3 pg (ref 25.1–34.0)
MCHC: 34.3 g/dL (ref 31.5–36.0)
NEUT%: 68.7 % (ref 38.4–76.8)
lymph#: 0.9 10*3/uL (ref 0.9–3.3)

## 2011-11-30 LAB — COMPREHENSIVE METABOLIC PANEL
ALT: 20 U/L (ref 0–35)
AST: 25 U/L (ref 0–37)
CO2: 26 mEq/L (ref 19–32)
Calcium: 9.8 mg/dL (ref 8.4–10.5)
Chloride: 103 mEq/L (ref 96–112)
Creatinine, Ser: 0.87 mg/dL (ref 0.50–1.10)
Sodium: 137 mEq/L (ref 135–145)
Total Protein: 7.3 g/dL (ref 6.0–8.3)

## 2011-11-30 MED ORDER — BORTEZOMIB CHEMO SQ INJECTION 3.5 MG (2.5MG/ML)
1.3000 mg/m2 | Freq: Once | INTRAMUSCULAR | Status: AC
  Administered 2011-11-30: 2 mg via SUBCUTANEOUS
  Filled 2011-11-30: qty 2

## 2011-11-30 MED ORDER — ONDANSETRON HCL 8 MG PO TABS
8.0000 mg | ORAL_TABLET | Freq: Once | ORAL | Status: AC
  Administered 2011-11-30: 8 mg via ORAL

## 2011-11-30 NOTE — Progress Notes (Signed)
[facility] OFFICE PROGRESS NOTE  [name], MD, MD [address] [place] [address]  PAST DIAGNOSIS:  multiple myeloma presented with bony disease and slight increase in her light chain.  Initial diagnosis was in September 2007.  She had presented initially with a plasmacytoma.  Subsequently, she had developed multiple myeloma.  PAST THERAPY: 1.  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  She has received total of 45 Gy in 25 fractions in March 2008. 2.  She received induction therapy with Revlimid and dexamethasone.  Subsequently, received Velcade and dexamethasone until September 2009. 3.  She received high dose chemotherapy and stem cell transplant in September 2009. 4.  She had a complete response.  She has been on active surveillance since that time.  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  CURRENT THERAPY:  started salvage chemo Velcade/Cytoxan/Dexamethasone on 05/05/11.  INTERVAL HISTORY: Kendra Johns 61 y.o. female returns for regular follow up with her daugher.  Her back pain has worsened this past week due to the cold weather.  She is taking MS Contin 60 mg by mouth 3 times a day and Opana for breakthrough pain with pain specialist.  Otherwise she is tolerating chemotherapy well without severe fatigue, mucositis, skin rash, neuropathy, injection site erythema, fever, abdominal pain, bleeding symptoms.  Patient denies headache, visual changes, confusion, drenching night sweats, palpable lymph node swelling, mucositis, odynophagia, dysphagia, nausea vomiting, chest pain, palpitation, shortness of breath, dyspnea on exertion, productive cough, gum bleeding, epistaxis, hematemesis, hemoptysis, abdominal pain, abdominal swelling, early satiety, melena, hematochezia, hematuria, skin rash, spontaneous bleeding, joint swelling, heat or cold intolerance, bowel  bladder incontinence, focal motor weakness, paresthesia, depression, suicidal or homocidal ideation, feeling hopelessness.  MEDICAL HISTORY: Past Medical History  Diagnosis Date  . Depression   . Hypertension   . Hepatitis C     genotype 1  . Diverticulosis of colon   . Multiple myeloma     kappa light chain, s/p high-dose chemo/stem cell tx - [facility]  . Hx of adenomatous colonic polyps 2007  . Anxiety   . Osteoarthritis   . Sleep apnea   . Hyperlipidemia   . GERD with stricture   . External hemorrhoids   . Hepatitis B virus infection   . IBS (irritable bowel syndrome)   . Helicobacter pylori gastritis 2001    ? if treated  . Sciatic nerve pain     with Dr. Delma Officer  . Chronic low back pain     ALLERGIES:  is allergic to codeine and ibuprofen.  MEDICATIONS: Current Outpatient Prescriptions  Medication Sig Dispense Refill  . acyclovir (ZOVIRAX) 400 MG tablet Take 400 mg by mouth 2 (two) times daily.        Marland Kitchen amLODipine (NORVASC) 5 MG tablet Take 1 tablet (5 mg total) by mouth daily.  90 tablet  3  . Bisacodyl (LAXATIVE PO) Take by mouth. As needed       . cyclophosphamide (CYTOXAN) 50 MG tablet Take 9 tablets (450 mg total) by mouth once a week. 9 tablets on empty stomach, 1 hour before or 2 hours after meals.   36 tablet  0  . dexamethasone (DECADRON) 4 MG tablet TAKE 10 TABLETS ONCE A WEEK  100 tablet  3  . diazepam (VALIUM) 5 MG tablet Take 1 tablet (5 mg total) by mouth as needed.  20 tablet  3  .  Diphenhyd-Hydrocort-Nystatin SUSP Use as directed 15 mLs in the mouth or throat 4 (four) times daily as needed (Swish and spit as needed for mouth soreness).  500 mL  2  . lisinopril (PRINIVIL,ZESTRIL) 20 MG tablet Take 1 tablet (20 mg total) by mouth daily.  90 tablet  11  . loperamide (IMODIUM) 2 MG capsule Take 2 mg by mouth as needed.        . Magnesium Hydroxide (MILK OF MAGNESIA PO) Take by mouth daily as needed.        Marland Kitchen morphine (MS CONTIN) 60 MG 12 hr tablet Take 60  mg by mouth 3 (three) times daily.       Marland Kitchen oxymorphone (OPANA) 10 MG tablet Take 10 mg by mouth every 6 (six) hours as needed. 1-2 tabs      . polyethylene glycol (MIRALAX / GLYCOLAX) packet Take 17 g by mouth every other day.        . prochlorperazine (COMPAZINE) 10 MG tablet Take 10 mg by mouth every 6 (six) hours as needed.        . gabapentin (NEURONTIN) 600 MG tablet Take 600 mg by mouth 3 (three) times daily.        . Sodium Phosphates (ENEMA RE) Place rectally. As needed        No current facility-administered medications for this visit.   Facility-Administered Medications Ordered in Other Visits  Medication Dose Route Frequency Provider Last Rate Last Dose  . bortezomib SQ (VELCADE) chemo injection 2 mg  1.3 mg/m2 (Treatment Plan Actual) Subcutaneous Once Jethro Bolus, MD   2 mg at 11/30/11 1151  . ondansetron (ZOFRAN) tablet 8 mg  8 mg Oral Once Jethro Bolus, MD   8 mg at 11/30/11 1128    SURGICAL HISTORY:  Past Surgical History  Procedure Date  . Abdominal hysterectomy   . Oophorectomy   . Dilation and curettage of uterus   . Bone marrow transplant   . Appendectomy   . Colonoscopy w/ polypectomy 08/2006    1-2 adenomas, 6 polyps total, diverticulosis and external hemorrhoids  . Upper gastrointestinal endoscopy 08/2003    esophageal stricture dilation, hiatal hernia, gastrritis  . Cholecystectomy   . Tonsillectomy and adenoidectomy     REVIEW OF SYSTEMS:  Pertinent items are noted in HPI.   Filed Vitals:   11/30/11 1029  BP: 133/89  Pulse: 83  Temp: 97.7 F (36.5 C)   Wt Readings from Last 3 Encounters:  11/26/11 134 lb (60.782 kg)  11/02/11 132 lb 12.8 oz (60.238 kg)  10/05/11 129 lb 8 oz (58.741 kg)    PHYSICAL EXAMINATION:   She was able to get on and off the exam table by herself.  There was no pain upon lying down.   General:  well-nourished in no acute distress.  Eyes:  no scleral icterus.  ENT:  There were no oropharyngeal lesions.  Neck was without thyromegaly.   Lymphatics:  Negative cervical, supraclavicular or axillary adenopathy.  Respiratory: lungs were clear bilaterally without wheezing or crackles.  Cardiovascular:  Regular rate and rhythm, S1/S2, without murmur, rub or gallop.  There was no pedal edema.  GI:  abdomen was soft, flat, nontender, nondistended, without organomegaly.  Muscoloskeletal:  no spinal tenderness of palpation of vertebral spine.  Skin exam was without echymosis, petichae.  Neuro exam was nonfocal.  Patient was able to get on and off exam table without assistance.  Gait was normal.  Patient was alerted and oriented.  Attention was  good.   Language was appropriate.  Mood was normal without depression.  Speech was not pressured.  Thought content was not tangential.     LABORATORY/RADIOLOGY DATA:  Lab Results  Component Value Date   WBC 5.1 11/30/2011   HGB 16.3* 11/30/2011   HCT 47.5* 11/30/2011   PLT 191 11/30/2011   GLUCOSE 88 11/30/2011   CHOL 240* 02/03/2011   TRIG 93.0 02/03/2011   HDL 43.00 02/03/2011   LDLDIRECT 176.9 02/03/2011   LDLCALC  Value: 112        Total Cholesterol/HDL:CHD Risk Coronary Heart Disease Risk Table                     Men   Women  1/2 Average Risk   3.4   3.3* 11/26/2007   ALT 20 11/30/2011   AST 25 11/30/2011   NA 137 11/30/2011   K 4.1 11/30/2011   CL 103 11/30/2011   CREATININE 0.87 11/30/2011   BUN 12 11/30/2011   CO2 26 11/30/2011   TSH 2.01 02/03/2011   INR 1.0 03/12/2008   HGBA1C  Value: 5.8 (NOTE)   The ADA recommends the following therapeutic goals for glycemic   control related to Hgb A1C measurement:   Goal of Therapy:   < 7.0% Hgb A1C   Action Suggested:  > 8.0% Hgb A1C   Ref:  Diabetes Care, 22, Suppl. 1, 1999 11/26/2007    M-spike and serum free light chain from today are pending  ASSESSMENT AND PLAN:   1. Multiple myeloma:  She is doing very well with Velcade/Cytoxan/Dex.  She has been having very good response with down trending of M-spike and light chains. Her last kappa:lambda ratio was only  3.34 and non detectable M-spike on 11/02/11.  She has not had any dose limiting toxicity.  She was seen by [facility] and was cleared by them.  Dr. Mellody Memos thought that she has enough stem cell from the first transplant for a repeat auto BMT mid February 2013.  Per her instruction, I will proceed with chemo today and next week.   2. History of cirrhosis and hepatitis C:  Diagnosed in 2009 with [facility] when she underwent the 1st transplant evaluation.  She is compensated without encephalopathy, ascites, bleeding, cytopenia.  She was cleared by [facility] service.  3. Prophylaxis:  While she is on chemotherapy, she is on acyclovir 400 mg p.o. b.i.d. to decrease her risk of herpes simplex virus reactivation.   4. Pain control in the right hip:  Either multiple myeloma versus the DJD versus sciatic nerve. She has a controlled with the current regimen of morphine sulfate MS Contin and breakthrough pain with Opana by Dr. [name]. I appreciate his assistance. 5. Hypertension:  Well controlled on lisinopril per PCP. 6. Followup once a week with labs, CBC and Velcade subcu injection.  I will see her in April 2013 after her BMT to potentially start maintenance chemotherapy for recurrent multiple myeloma. I discussed with her that randomized trails have shown decreased risk of disease recurrence rate with maintenance therapy as opposed to observation after BMT.   The length of time of the face-to-face encounter was 15 minutes. More than 50% of time was spent counseling and coordination of care.

## 2011-11-30 NOTE — Telephone Encounter (Signed)
Gv pt appt for [id number] °

## 2011-11-30 NOTE — Progress Notes (Signed)
Called [facility] to cancel next refill of Cytoxan, per Dr. Gaylyn Rong.

## 2011-11-30 NOTE — Patient Instructions (Signed)
Drink lots of fluids.  Call prn.  Reports preparing for transplant.

## 2011-12-01 ENCOUNTER — Telehealth: Payer: Self-pay | Admitting: Internal Medicine

## 2011-12-01 LAB — KAPPA/LAMBDA LIGHT CHAINS: Kappa free light chain: 3.75 mg/dL — ABNORMAL HIGH (ref 0.33–1.94)

## 2011-12-01 NOTE — Telephone Encounter (Signed)
Refill request for Valium has not been received from 11-25-10. Please contact pharmacy and pt. To let them know when it has been completed.

## 2011-12-02 LAB — PROTEIN ELECTROPHORESIS, SERUM
Alpha-2-Globulin: 12.6 % — ABNORMAL HIGH (ref 7.1–11.8)
Beta Globulin: 6.1 % (ref 4.7–7.2)
Gamma Globulin: 14 % (ref 11.1–18.8)
Total Protein, Serum Electrophoresis: 7.2 g/dL (ref 6.0–8.3)

## 2011-12-03 NOTE — Telephone Encounter (Signed)
rx called in to [facility] 

## 2011-12-03 NOTE — Progress Notes (Signed)
Received progress notes from Dr. Lillia Abed office; forwarded to Dr. Gaylyn Rong.

## 2011-12-07 ENCOUNTER — Other Ambulatory Visit: Payer: Medicare Other | Admitting: Lab

## 2011-12-07 ENCOUNTER — Ambulatory Visit (HOSPITAL_BASED_OUTPATIENT_CLINIC_OR_DEPARTMENT_OTHER): Payer: Medicare Other

## 2011-12-07 VITALS — BP 129/86 | HR 80 | Temp 97.3°F

## 2011-12-07 DIAGNOSIS — Z5112 Encounter for antineoplastic immunotherapy: Secondary | ICD-10-CM

## 2011-12-07 DIAGNOSIS — C9000 Multiple myeloma not having achieved remission: Secondary | ICD-10-CM

## 2011-12-07 LAB — CBC WITH DIFFERENTIAL/PLATELET
Basophils Absolute: 0 10*3/uL (ref 0.0–0.1)
EOS%: 4.1 % (ref 0.0–7.0)
HCT: 44.8 % (ref 34.8–46.6)
HGB: 15.7 g/dL (ref 11.6–15.9)
LYMPH%: 16.9 % (ref 14.0–49.7)
MCH: 32.4 pg (ref 25.1–34.0)
MCV: 92.6 fL (ref 79.5–101.0)
MONO%: 11.2 % (ref 0.0–14.0)
NEUT%: 67.1 % (ref 38.4–76.8)
RDW: 15.3 % — ABNORMAL HIGH (ref 11.2–14.5)

## 2011-12-07 MED ORDER — ONDANSETRON HCL 8 MG PO TABS
8.0000 mg | ORAL_TABLET | Freq: Once | ORAL | Status: AC
  Administered 2011-12-07: 8 mg via ORAL

## 2011-12-07 MED ORDER — BORTEZOMIB CHEMO SQ INJECTION 3.5 MG (2.5MG/ML)
1.3000 mg/m2 | Freq: Once | INTRAMUSCULAR | Status: AC
  Administered 2011-12-07: 2 mg via SUBCUTANEOUS
  Filled 2011-12-07: qty 2

## 2011-12-14 ENCOUNTER — Ambulatory Visit: Payer: Medicare Other

## 2011-12-14 ENCOUNTER — Other Ambulatory Visit: Payer: Medicare Other | Admitting: Lab

## 2011-12-23 NOTE — Progress Notes (Signed)
Received progress notes from Dr. Rhea Pink @ [facility]; forwarded to Dr. Gaylyn Rong.

## 2011-12-25 NOTE — Progress Notes (Signed)
Received office notes form Dr. Ivette Loyal Long @ [facility]; forwarded to Dr. Gaylyn Rong.

## 2012-01-04 ENCOUNTER — Other Ambulatory Visit: Payer: Self-pay | Admitting: *Deleted

## 2012-01-04 ENCOUNTER — Telehealth: Payer: Self-pay | Admitting: Oncology

## 2012-01-04 ENCOUNTER — Other Ambulatory Visit: Payer: Self-pay | Admitting: Oncology

## 2012-01-04 ENCOUNTER — Telehealth: Payer: Self-pay | Admitting: *Deleted

## 2012-01-04 DIAGNOSIS — B182 Chronic viral hepatitis C: Secondary | ICD-10-CM

## 2012-01-04 DIAGNOSIS — C9000 Multiple myeloma not having achieved remission: Secondary | ICD-10-CM

## 2012-01-04 NOTE — Telephone Encounter (Signed)
called pt and informed her of appt on 03/12

## 2012-01-04 NOTE — Telephone Encounter (Signed)
Call from Nurse Practioner at [facility],  States pt being d/c'd today back home.  Requests labs this Friday 3/08 at [facility] to include CBC, CMET and Mag.. She requests f/u visit w/ Dr. Gaylyn Rong next week.   I asked Peggy to make sure d/c notes faxed to [place] and she agreed.   POF to scheduling to schedule pt's labs for Friday and note forwarded to dr. Gaylyn Rong regarding f/u visit.

## 2012-01-04 NOTE — Telephone Encounter (Signed)
called pt and scheduled a lab appt for 03/08

## 2012-01-08 ENCOUNTER — Telehealth: Payer: Self-pay | Admitting: *Deleted

## 2012-01-08 ENCOUNTER — Other Ambulatory Visit (HOSPITAL_BASED_OUTPATIENT_CLINIC_OR_DEPARTMENT_OTHER): Payer: Medicare Other | Admitting: Lab

## 2012-01-08 DIAGNOSIS — C9000 Multiple myeloma not having achieved remission: Secondary | ICD-10-CM

## 2012-01-08 DIAGNOSIS — C8299 Follicular lymphoma, unspecified, extranodal and solid organ sites: Secondary | ICD-10-CM

## 2012-01-08 LAB — CBC WITH DIFFERENTIAL/PLATELET
Basophils Absolute: 0 10*3/uL (ref 0.0–0.1)
EOS%: 0.6 % (ref 0.0–7.0)
HCT: 41 % (ref 34.8–46.6)
HGB: 13.7 g/dL (ref 11.6–15.9)
MCH: 32 pg (ref 25.1–34.0)
MCV: 95.8 fL (ref 79.5–101.0)
NEUT%: 50.2 % (ref 38.4–76.8)
lymph#: 1.1 10*3/uL (ref 0.9–3.3)

## 2012-01-08 LAB — TECHNOLOGIST REVIEW

## 2012-01-08 LAB — COMPREHENSIVE METABOLIC PANEL
Albumin: 4.4 g/dL (ref 3.5–5.2)
Alkaline Phosphatase: 76 U/L (ref 39–117)
BUN: 10 mg/dL (ref 6–23)
Calcium: 9.5 mg/dL (ref 8.4–10.5)
Glucose, Bld: 107 mg/dL — ABNORMAL HIGH (ref 70–99)
Potassium: 4.7 mEq/L (ref 3.5–5.3)

## 2012-01-08 LAB — MAGNESIUM: Magnesium: 2 mg/dL (ref 1.5–2.5)

## 2012-01-08 NOTE — Telephone Encounter (Signed)
Pt called, says she has been "sick for the last 3 days."   Pt was here for labs this morning, but did not contact RN at that time.  Asked pt if she has called anyone about her symptoms and she says she has been confused about who to call,  [facility] or Dr. Gaylyn Rong, so she has not called anyone.  Pt seemed to have some difficulty in explaining her symptoms and sounded a little confused/disoriented on the phone.  She report fevers last night, max 100.6 F and took tylenol x 2.  States some sob w/ coughing, but not coughing up anything.  Main complaint is pain mid chest where her "tube came out"  Asked pt if she is referring to a Hickman Catheter and she says she thinks that is what it was.  She denies any redness or drainage at the site, says it is just very sore.  Once again pt states unsure who she is supposed to call for problems.  Informed her that Dr. Gaylyn Rong is out of office today and I will call [facility] to contact her about her symptoms.   Called [facility] and s/w triage RN, at ph [phone],  Was explaining pt's complaints and got disconnected.  Called back and left VM for triage RN to call pt about her symptoms and to call this RN back to confirm.   Faxed CBC and CMET results to [facility] at fax #[phone].

## 2012-01-08 NOTE — Telephone Encounter (Signed)
Called Triage RN back to see if she has contacted pt yet and what the outcome/instructions were.  Requested call back.

## 2012-01-08 NOTE — Telephone Encounter (Signed)
Have not heard back from [facility].  Called pt and she states they did call her and she has appt to be seen there tomorrow morning.  Instructed pt to call them for any further problems tonight before her appt tomorrow and confirmed her appt here w/ Dr. Gaylyn Rong on 01/12/12.  Pt verbalized understanding.

## 2012-01-12 ENCOUNTER — Telehealth: Payer: Self-pay | Admitting: Oncology

## 2012-01-12 ENCOUNTER — Other Ambulatory Visit (HOSPITAL_BASED_OUTPATIENT_CLINIC_OR_DEPARTMENT_OTHER): Payer: Medicare Other | Admitting: Lab

## 2012-01-12 ENCOUNTER — Ambulatory Visit (HOSPITAL_BASED_OUTPATIENT_CLINIC_OR_DEPARTMENT_OTHER): Payer: Medicare Other | Admitting: Oncology

## 2012-01-12 VITALS — BP 116/88 | HR 107 | Temp 97.9°F | Ht 64.0 in | Wt 128.0 lb

## 2012-01-12 DIAGNOSIS — R630 Anorexia: Secondary | ICD-10-CM | POA: Insufficient documentation

## 2012-01-12 DIAGNOSIS — B182 Chronic viral hepatitis C: Secondary | ICD-10-CM

## 2012-01-12 DIAGNOSIS — R634 Abnormal weight loss: Secondary | ICD-10-CM | POA: Insufficient documentation

## 2012-01-12 DIAGNOSIS — C9000 Multiple myeloma not having achieved remission: Secondary | ICD-10-CM

## 2012-01-12 DIAGNOSIS — M25559 Pain in unspecified hip: Secondary | ICD-10-CM

## 2012-01-12 DIAGNOSIS — I10 Essential (primary) hypertension: Secondary | ICD-10-CM

## 2012-01-12 LAB — CBC WITH DIFFERENTIAL/PLATELET
BASO%: 1.8 % (ref 0.0–2.0)
HCT: 38.1 % (ref 34.8–46.6)
MCHC: 33.3 g/dL (ref 31.5–36.0)
MONO#: 0.8 10*3/uL (ref 0.1–0.9)
RBC: 3.99 10*6/uL (ref 3.70–5.45)
RDW: 18 % — ABNORMAL HIGH (ref 11.2–14.5)
WBC: 5 10*3/uL (ref 3.9–10.3)
lymph#: 1.9 10*3/uL (ref 0.9–3.3)
nRBC: 0 % (ref 0–0)

## 2012-01-12 LAB — COMPREHENSIVE METABOLIC PANEL
ALT: 22 U/L (ref 0–35)
AST: 51 U/L — ABNORMAL HIGH (ref 0–37)
Albumin: 4.1 g/dL (ref 3.5–5.2)
Alkaline Phosphatase: 70 U/L (ref 39–117)
Calcium: 10.3 mg/dL (ref 8.4–10.5)
Chloride: 100 mEq/L (ref 96–112)
Potassium: 4.5 mEq/L (ref 3.5–5.3)
Sodium: 133 mEq/L — ABNORMAL LOW (ref 135–145)

## 2012-01-12 NOTE — Telephone Encounter (Signed)
Called [facility] , they will schedule pt after they get GI records from [facility], filled referral gave to HIM

## 2012-01-12 NOTE — Progress Notes (Signed)
K[facility] OFFICE PROGRESS NOTE  Kendra Petit, MD, MD [address] [place] [address]  PAST DIAGNOSIS:  multiple myeloma presented with bony disease and slight increase in her light chain.  Initial diagnosis was in September 2007.  She had presented initially with a plasmacytoma.  Subsequently, she had developed multiple myeloma.  PAST THERAPY: 1.  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  She has received total of 45 Gy in 25 fractions in March 2008. 2.  She received induction therapy with Revlimid and dexamethasone.  Subsequently, received Velcade and dexamethasone until September 2009. 3.  She received high dose chemotherapy and stem cell transplant in September 2009. 4.  She had a complete response.  She has been on active surveillance since that time. 5.  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  She received salvage chemo Velcade/Cytoxan/Dexamethasone on 05/05/11 with very good partial response.  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   CURRENT THERAPY:  Watchful observation; recovering from transplant before starting maintenance therapy in the next few months.   INTERVAL HISTORY: Kendra Johns 61 y.o. female returns for regular follow up with her daugher.   She had fever during this past transplant, and was thought to have some dental abscess.  She was evaluated by dentist at [facility] and was given empiric antibiotics.  She was discharged home from [facility] on 01/04/12.  She has had low grade fever; last one was about 100.73F on Friday 01/08/12.  She was evaluated at [facility] on 01/09/12 with reportedly negative CXR, negative blood culture.  She was discharged home on observation.    She has low appetite since she has no taste for foods.  She is loosing weight.  She was given Remeron by [facility]; however, she took it for one day and stopped since  she did not see immediate improvement of her appetite.  She is supposed to drink 3 cans of Ensure daily; however, she has only been taking at most 2.  She had skin rash with Melphalan preconditioning regimen before transplant.  The rash was quite diffuse; and she was beet red.  There was no pruritis.  The rash is also getting better.  She has fatigue; and does not want to do much outside of the house.  She still has chronic lower back pain for which she is taking MS Contin BID and Opana for break through with   Patient denies headache, visual changes, confusion, drenching night sweats, palpable lymph node swelling, mucositis, odynophagia, dysphagia, nausea vomiting, jaundice, chest pain, palpitation, shortness of breath, dyspnea on exertion, productive cough, gum bleeding, epistaxis, hematemesis, hemoptysis, abdominal pain, abdominal swelling, early satiety, melena, hematochezia, hematuria, skin rash, spontaneous bleeding, joint swelling, joint pain, heat or cold intolerance, bowel bladder incontinence, back pain, focal motor weakness, paresthesia, depression, suicidal or homocidal ideation, feeling hopelessness.   MEDICAL HISTORY: Past Medical History  Diagnosis Date  . Depression   . Hypertension   . Hepatitis C     genotype 1  . Diverticulosis of colon   . Multiple myeloma     kappa light chain, s/p high-dose chemo/stem cell tx - [facility]  . Hx of adenomatous colonic polyps 2007  . Anxiety   . Osteoarthritis   . Sleep apnea   . Hyperlipidemia   . GERD with stricture   . External hemorrhoids   . Hepatitis B virus  infection   . IBS (irritable bowel syndrome)   . Helicobacter pylori gastritis 2001    ? if treated  . Sciatic nerve pain     with Dr. Delma Officer  . Chronic low back pain     ALLERGIES:  is allergic to codeine and ibuprofen.  MEDICATIONS: Current Outpatient Prescriptions  Medication Sig Dispense Refill  . acyclovir (ZOVIRAX) 400 MG tablet Take 400 mg by mouth 2 (two)  times daily.        . clindamycin (CLEOCIN) 300 MG capsule Take 300 mg by mouth 4 (four) times daily.      . diazepam (VALIUM) 5 MG tablet Take 1 tablet (5 mg total) by mouth as needed.  20 tablet  3  . fluconazole (DIFLUCAN) 200 MG tablet Take 400 mg by mouth daily.      Marland Kitchen morphine (MS CONTIN) 60 MG 12 hr tablet Take 60 mg by mouth every 8 (eight) hours.       Marland Kitchen oxymorphone (OPANA) 10 MG tablet Take 10 mg by mouth every 6 (six) hours as needed. 1-2 tabs      . prochlorperazine (COMPAZINE) 10 MG tablet Take 10 mg by mouth every 6 (six) hours as needed.        . ursodiol (ACTIGALL) 300 MG capsule Take 300 mg by mouth 2 (two) times daily.      . Bisacodyl (LAXATIVE PO) Take by mouth. As needed       . loperamide (IMODIUM) 2 MG capsule Take 2 mg by mouth as needed.        . Magnesium Hydroxide (MILK OF MAGNESIA PO) Take by mouth daily as needed.        . mirtazapine (REMERON) 15 MG tablet Take 15 mg by mouth at bedtime.      . polyethylene glycol (MIRALAX / GLYCOLAX) packet Take 17 g by mouth every other day.        . Sodium Phosphates (ENEMA RE) Place rectally. As needed         SURGICAL HISTORY:  Past Surgical History  Procedure Date  . Abdominal hysterectomy   . Oophorectomy   . Dilation and curettage of uterus   . Bone marrow transplant   . Appendectomy   . Colonoscopy w/ polypectomy 08/2006    1-2 adenomas, 6 polyps total, diverticulosis and external hemorrhoids  . Upper gastrointestinal endoscopy 08/2003    esophageal stricture dilation, hiatal hernia, gastrritis  . Cholecystectomy   . Tonsillectomy and adenoidectomy     REVIEW OF SYSTEMS:  Pertinent items are noted in HPI.   Filed Vitals:   01/12/12 1109  BP: 116/88  Pulse: 107  Temp: 97.9 F (36.6 C)   Wt Readings from Last 3 Encounters:  01/12/12 128 lb (58.06 kg)  11/26/11 134 lb (60.782 kg)  11/02/11 132 lb 12.8 oz (60.238 kg)  ECOG 1-2  PHYSICAL EXAMINATION:   She was able to get on and off the exam table by  herself.  There was no pain upon lying down.   General:  well-nourished in no acute distress.  Eyes:  no scleral icterus.  ENT:  There were no oropharyngeal lesions.  Neck was without thyromegaly.  Lymphatics:  Negative cervical, supraclavicular or axillary adenopathy.  Respiratory: lungs were clear bilaterally without wheezing or crackles.  Cardiovascular:  Regular rate and rhythm, S1/S2, without murmur, rub or gallop.  There was no pedal edema.  GI:  abdomen was soft, flat, nontender, nondistended, without organomegaly.  Muscoloskeletal:  no  spinal tenderness of palpation of vertebral spine.  Skin exam was without echymosis, petichae.  Neuro exam was nonfocal.  Patient was able to get on and off exam table without assistance.  Gait was normal.  Patient was alerted and oriented.  Attention was good.   Language was appropriate.  Mood was normal without depression.  Speech was not pressured.  Thought content was not tangential.     LABORATORY/RADIOLOGY DATA:  Lab Results  Component Value Date   WBC 5.0 01/12/2012   HGB 12.7 01/12/2012   HCT 38.1 01/12/2012   PLT 149 01/12/2012   GLUCOSE 107* 01/08/2012   CHOL 240* 02/03/2011   TRIG 93.0 02/03/2011   HDL 43.00 02/03/2011   LDLDIRECT 176.9 02/03/2011   LDLCALC  Value: 112        Total Cholesterol/HDL:CHD Risk Coronary Heart Disease Risk Table                     Men   Women  1/2 Average Risk   3.4   3.3* 11/26/2007   ALT 28 01/08/2012   AST 49* 01/08/2012   NA 135 01/08/2012   K 4.7 01/08/2012   CL 100 01/08/2012   CREATININE 0.89 01/08/2012   BUN 10 01/08/2012   CO2 20 01/08/2012   TSH 2.01 02/03/2011   INR 1.0 03/12/2008   HGBA1C  Value: 5.8 (NOTE)   The ADA recommends the following therapeutic goals for glycemic   control related to Hgb A1C measurement:   Goal of Therapy:   < 7.0% Hgb A1C   Action Suggested:  > 8.0% Hgb A1C   Ref:  Diabetes Care, 22, Suppl. 1, 1999 11/26/2007   ASSESSMENT AND PLAN:   1. Multiple myeloma: She is s/p 2nd auto BMT.  She is recovering.  She  has f/u with [facility] 02/05/12.  I will see her mid-late May 2013 to see if she has recovered well prior to starting maintenance chemo with Velcade.  2. History of cirrhosis and hepatitis C:  Diagnosed in 2009 with [facility] when she underwent the 1st transplant evaluation.  She is compensated without encephalopathy, ascites, bleeding, cytopenia.  She was cleared by [facility] Liver service.  3. Prophylaxis:  she is on acyclovir 400 mg p.o. b.i.d. to decrease her risk of herpes simplex virus reactivation.   4. Pain control in the right hip:  Either multiple myeloma versus the DJD versus sciatic nerve. She has a controlled with the current regimen of morphine sulfate MS Contin and breakthrough pain with Opana by Dr. [name]. I appreciate his assistance. 5. Hypertension:  She was on lisinopril per PCP.  Her BP has not lowered due to recent weight loss.  She is not on antihypertensive at this time. 6. Anorexia, weight loss:  2/2 recent auto BMT.  This is expected.  I strongly urged her to take Remeron prescribed by [facility] to increase her oral intake in order to recover faster.  I referred her to Vernell Leep from Nutrition for further management.  I advised her to take at least 3 cans of Ensure daily.  7. Follow up with lab q2-3 weeks here.  She has f/u with [facility] 02/05/12.  I'll se her mid-late May 2013.   The length of time of the face-to-face encounter was 15 minutes. More than 50% of time was spent counseling and coordination of care.

## 2012-01-14 ENCOUNTER — Ambulatory Visit: Payer: Medicare Other | Admitting: Internal Medicine

## 2012-01-19 ENCOUNTER — Encounter: Payer: Medicare Other | Admitting: Nutrition

## 2012-01-25 ENCOUNTER — Other Ambulatory Visit (HOSPITAL_BASED_OUTPATIENT_CLINIC_OR_DEPARTMENT_OTHER): Payer: Medicare Other | Admitting: Lab

## 2012-01-25 DIAGNOSIS — C9000 Multiple myeloma not having achieved remission: Secondary | ICD-10-CM

## 2012-01-25 LAB — CBC WITH DIFFERENTIAL/PLATELET
BASO%: 0.6 % (ref 0.0–2.0)
EOS%: 4.9 % (ref 0.0–7.0)
LYMPH%: 18.1 % (ref 14.0–49.7)
MCHC: 33 g/dL (ref 31.5–36.0)
MCV: 99.1 fL (ref 79.5–101.0)
MONO#: 0.9 10*3/uL (ref 0.1–0.9)
MONO%: 13.3 % (ref 0.0–14.0)
Platelets: 175 10*3/uL (ref 145–400)
RBC: 4.09 10*6/uL (ref 3.70–5.45)
WBC: 6.5 10*3/uL (ref 3.9–10.3)

## 2012-01-25 LAB — COMPREHENSIVE METABOLIC PANEL
ALT: 14 U/L (ref 0–35)
AST: 37 U/L (ref 0–37)
Alkaline Phosphatase: 60 U/L (ref 39–117)
Sodium: 139 mEq/L (ref 135–145)
Total Bilirubin: 0.4 mg/dL (ref 0.3–1.2)
Total Protein: 6.6 g/dL (ref 6.0–8.3)

## 2012-02-05 ENCOUNTER — Encounter: Payer: Self-pay | Admitting: Oncology

## 2012-02-08 ENCOUNTER — Other Ambulatory Visit: Payer: Medicare Other | Admitting: Lab

## 2012-02-08 ENCOUNTER — Ambulatory Visit: Payer: Medicare Other | Admitting: Oncology

## 2012-02-10 ENCOUNTER — Other Ambulatory Visit: Payer: Self-pay | Admitting: Oncology

## 2012-02-22 ENCOUNTER — Other Ambulatory Visit (HOSPITAL_BASED_OUTPATIENT_CLINIC_OR_DEPARTMENT_OTHER): Payer: Medicare Other | Admitting: Lab

## 2012-02-22 DIAGNOSIS — C9000 Multiple myeloma not having achieved remission: Secondary | ICD-10-CM

## 2012-02-22 LAB — COMPREHENSIVE METABOLIC PANEL
AST: 26 U/L (ref 0–37)
Alkaline Phosphatase: 58 U/L (ref 39–117)
BUN: 6 mg/dL (ref 6–23)
Calcium: 10.1 mg/dL (ref 8.4–10.5)
Chloride: 108 mEq/L (ref 96–112)
Creatinine, Ser: 0.76 mg/dL (ref 0.50–1.10)
Glucose, Bld: 110 mg/dL — ABNORMAL HIGH (ref 70–99)

## 2012-02-22 LAB — MAGNESIUM: Magnesium: 1.9 mg/dL (ref 1.5–2.5)

## 2012-02-22 LAB — CBC WITH DIFFERENTIAL/PLATELET
BASO%: 1.2 % (ref 0.0–2.0)
EOS%: 5.1 % (ref 0.0–7.0)
HCT: 40.3 % (ref 34.8–46.6)
LYMPH%: 31.6 % (ref 14.0–49.7)
MCH: 33.4 pg (ref 25.1–34.0)
MCHC: 34 g/dL (ref 31.5–36.0)
NEUT%: 55.6 % (ref 38.4–76.8)
Platelets: 186 10*3/uL (ref 145–400)

## 2012-02-29 NOTE — Progress Notes (Signed)
Per Dr. Eduard Roux is on hold for now.

## 2012-03-15 NOTE — Progress Notes (Signed)
Received progress notes from Dr. Eddie Candle @ [facility]; forwarded to Dr. Gaylyn Rong.

## 2012-03-21 ENCOUNTER — Other Ambulatory Visit (HOSPITAL_BASED_OUTPATIENT_CLINIC_OR_DEPARTMENT_OTHER): Payer: Medicare Other

## 2012-03-21 ENCOUNTER — Ambulatory Visit (HOSPITAL_BASED_OUTPATIENT_CLINIC_OR_DEPARTMENT_OTHER): Payer: Medicare Other | Admitting: Oncology

## 2012-03-21 VITALS — BP 118/86 | HR 82 | Temp 98.1°F | Ht 64.0 in | Wt 119.0 lb

## 2012-03-21 DIAGNOSIS — C8299 Follicular lymphoma, unspecified, extranodal and solid organ sites: Secondary | ICD-10-CM

## 2012-03-21 DIAGNOSIS — C9000 Multiple myeloma not having achieved remission: Secondary | ICD-10-CM

## 2012-03-21 DIAGNOSIS — I10 Essential (primary) hypertension: Secondary | ICD-10-CM

## 2012-03-21 DIAGNOSIS — R634 Abnormal weight loss: Secondary | ICD-10-CM

## 2012-03-21 DIAGNOSIS — G8929 Other chronic pain: Secondary | ICD-10-CM

## 2012-03-21 LAB — CBC WITH DIFFERENTIAL/PLATELET
Basophils Absolute: 0 10*3/uL (ref 0.0–0.1)
Eosinophils Absolute: 0.2 10*3/uL (ref 0.0–0.5)
HCT: 43.8 % (ref 34.8–46.6)
HGB: 14.8 g/dL (ref 11.6–15.9)
MCH: 32.5 pg (ref 25.1–34.0)
MONO#: 0.4 10*3/uL (ref 0.1–0.9)
NEUT#: 3.1 10*3/uL (ref 1.5–6.5)
NEUT%: 61.6 % (ref 38.4–76.8)
RDW: 14.1 % (ref 11.2–14.5)
WBC: 5.1 10*3/uL (ref 3.9–10.3)
lymph#: 1.3 10*3/uL (ref 0.9–3.3)

## 2012-03-21 NOTE — Progress Notes (Signed)
[facility]  [phone] [phone]   OFFICE PROGRESS NOTE   Cc:  [name], MD, MD  PAST DIAGNOSIS: multiple myeloma presented with bony disease and slight increase in her light chain. Initial diagnosis was in September 2007. She had presented initially with a plasmacytoma. Subsequently, she had developed multiple myeloma.   PAST THERAPY:  1. 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 She has received total of 45 Gy in 25 fractions in March 2008.  2. She received induction therapy with Revlimid and dexamethasone. Subsequently, received Velcade and dexamethasone until September 2009.  3. She received high dose chemotherapy and stem cell transplant in September 2009.  4. She had a complete response. She has been on active surveillance since that time.  5. 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 She received salvage chemo Velcade/Cytoxan/Dexamethasone on 05/05/11 with very good partial response. 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   CURRENT THERAPY: Watchful observation; recovering from transplant before starting maintenance therapy in the next few months.   INTERVAL HISTORY: EMBER HENRIKSON 61 y.o. female returns for regular follow up with her daughter.  She reported that she still has chronic lower back pain radiation to the bilateral hip.  She has mild fatigue.  She is able to carry out activities of daily living. Whenever she pushes herself to go shopping or prolonged ambulation more than 10 minutes, she would develop worsened back pain for a few days afterward.  Her pain now is about 5/10 at rest but comes to 10/10 with prolonged weight bearing.  The pain does not cause leg weakness, bowel/bladder incontinence, or leg paresthesia.  She does have good appetite; however, as soon as she starts eating, she feels full  easily.  She has lost a few pounds since prior.  She denies abdominal pan, nausea/vomiting, GI bleeding symptoms.   Patient denies headache, visual changes, confusion, drenching night sweats, palpable lymph node swelling, mucositis, odynophagia, dysphagia, nausea vomiting, jaundice, chest pain, palpitation, shortness of breath, dyspnea on exertion, productive cough, gum bleeding, epistaxis, hematemesis, hemoptysis, abdominal pain, abdominal swelling, melena, hematochezia, hematuria, skin rash, spontaneous bleeding, joint swelling, joint pain, heat or cold intolerance, bowel bladder incontinence, back pain, focal motor weakness, paresthesia, depression, suicidal or homocidal ideation, feeling hopelessness.   Past Medical History  Diagnosis Date  . Depression   . Hypertension   . Hepatitis C     genotype 1  . Diverticulosis of colon   . Multiple myeloma     kappa light chain, s/p high-dose chemo/stem cell tx - [facility]  . Hx of adenomatous colonic polyps 2007  . Anxiety   . Osteoarthritis   . Sleep apnea   . Hyperlipidemia   . GERD with stricture   . External hemorrhoids   . Hepatitis B virus infection   . IBS (irritable bowel syndrome)   . Helicobacter pylori gastritis 2001    ? if treated  . Sciatic nerve pain     with Dr. Delma Officer  . Chronic low back pain     Past Surgical History  Procedure Date  . Abdominal hysterectomy   . Oophorectomy   . Dilation and curettage of uterus   . Bone marrow transplant   . Appendectomy   . Colonoscopy w/ polypectomy 08/2006    1-2 adenomas, 6 polyps total, diverticulosis and external hemorrhoids  . Upper gastrointestinal  endoscopy 08/2003    esophageal stricture dilation, hiatal hernia, gastrritis  . Cholecystectomy   . Tonsillectomy and adenoidectomy     Current Outpatient Prescriptions  Medication Sig Dispense Refill  . acyclovir (ZOVIRAX) 400 MG tablet Take 400 mg by mouth 2 (two) times daily.        . Bisacodyl (LAXATIVE PO) Take  by mouth. As needed       . diazepam (VALIUM) 5 MG tablet Take 1 tablet (5 mg total) by mouth as needed.  20 tablet  3  . DULoxetine (CYMBALTA) 30 MG capsule Take 30 mg by mouth daily.      . Magnesium Hydroxide (MILK OF MAGNESIA PO) Take by mouth daily as needed.        Marland Kitchen morphine (MS CONTIN) 60 MG 12 hr tablet Take 60 mg by mouth every 8 (eight) hours.       Marland Kitchen oxymorphone (OPANA) 10 MG tablet Take 10 mg by mouth every 6 (six) hours as needed. 1-2 tabs      . prochlorperazine (COMPAZINE) 10 MG tablet Take 10 mg by mouth every 6 (six) hours as needed.        . senna-docusate (SENOKOT-S) 8.6-50 MG per tablet Take 2 tablets by mouth daily.      . Sodium Phosphates (ENEMA RE) Place rectally. As needed       . polyethylene glycol (MIRALAX / GLYCOLAX) packet Take 17 g by mouth every other day.        Marland Kitchen DISCONTD: amLODipine (NORVASC) 5 MG tablet Take 1 tablet (5 mg total) by mouth daily.  90 tablet  3  . DISCONTD: gabapentin (NEURONTIN) 600 MG tablet Take 600 mg by mouth 3 (three) times daily.        Marland Kitchen DISCONTD: lisinopril (PRINIVIL,ZESTRIL) 20 MG tablet Take 1 tablet (20 mg total) by mouth daily.  90 tablet  11    ALLERGIES:  is allergic to codeine and ibuprofen.  REVIEW OF SYSTEMS:  The rest of the 14-point review of system was negative.   Filed Vitals:   03/21/12 1151  BP: 118/86  Pulse: 82  Temp: 98.1 F (36.7 C)   Wt Readings from Last 3 Encounters:  03/21/12 119 lb (53.978 kg)  01/12/12 128 lb (58.06 kg)  11/26/11 134 lb (60.782 kg)   ECOG Performance status: 1-2  PHYSICAL EXAMINATION:   She was able to get on and off the exam table by herself. There was no pain upon lying down. General: thin-appearing woman, in no acute distress. Eyes: no scleral icterus. ENT: There were no oropharyngeal lesions. Neck was without thyromegaly. Lymphatics: Negative cervical, supraclavicular or axillary adenopathy. Respiratory: lungs were clear bilaterally without wheezing or crackles.  Cardiovascular: Regular rate and rhythm, S1/S2, without murmur, rub or gallop. There was no pedal edema. GI: abdomen was soft, flat, nontender, nondistended, without organomegaly. Muscoloskeletal: no spinal tenderness of palpation of vertebral spine. Skin exam was without echymosis, petichae. Neuro exam was nonfocal. Patient was able to get on and off exam table without assistance. Gait was normal. Patient was alerted and oriented. Attention was good. Language was appropriate. Mood was normal without depression. Speech was not pressured. Thought content was not tangential.     LABORATORY/RADIOLOGY DATA:  Lab Results  Component Value Date   WBC 5.1 03/21/2012   HGB 14.8 03/21/2012   HCT 43.8 03/21/2012   PLT 197 03/21/2012   GLUCOSE 114* 03/21/2012   CHOL 240* 02/03/2011   TRIG 93.0 02/03/2011   HDL 43.00  02/03/2011   LDLDIRECT 176.9 02/03/2011   LDLCALC  Value: 112        Total Cholesterol/HDL:CHD Risk Coronary Heart Disease Risk Table                     Men   Women  1/2 Average Risk   3.4   3.3* 11/26/2007   ALKPHOS 103 03/21/2012   ALT 80* 03/21/2012   AST 88* 03/21/2012   NA 137 03/21/2012   K 4.1 03/21/2012   CL 104 03/21/2012   CREATININE 0.79 03/21/2012   BUN 11 03/21/2012   CO2 22 03/21/2012   INR 1.0 03/12/2008   HGBA1C  Value: 5.8 (NOTE)   The ADA recommends the following therapeutic goals for glycemic   control related to Hgb A1C measurement:   Goal of Therapy:   < 7.0% Hgb A1C   Action Suggested:  > 8.0% Hgb A1C   Ref:  Diabetes Care, 22, Suppl. 1, 1999 11/26/2007     ASSESSMENT AND PLAN:   1. Multiple myeloma: She is s/p 2nd auto BMT. Her CBC has completely normalized. She has f/u with [facility] 02/05/12. I recommended that she resumes maintenance Velcade since she had remarkable response to it in the salvage setting before the 2nd transplant.  With relapsed disease after the first transplant, she declared herself to be hight risk.  Thus, I recommended Velcade over Revlimid.  I discussed with her  potential side effects of Velcade which include but not limited to neuropathy, cytopenia, infection, fatigue.  She tolerated this well before; and thus, she will most likely tolerate it well again.   2. History of cirrhosis and hepatitis C: Diagnosed in 2009 with [facility] when she underwent the 1st transplant evaluation. She is compensated without encephalopathy, ascites, bleeding, cytopenia. She does not drink EtOH.   3. Prophylaxis: she is on acyclovir 400 mg p.o. b.i.d. to decrease her risk of herpes simplex virus reactivation while on Velcade.  4. Pain control in the right hip: Either multiple myeloma versus the DJD versus sciatic nerve. She has a controlled with the current regimen of morphine sulfate MS Contin and breakthrough pain with Opana by Dr. [name]. I appreciate his assistance. Past work up did not show evidence of cord compression.  5. Hypertension: now normotensive due to recent weight loss from BMT.  She is off of BP med for now.  6. Anorexia, weight loss: 2/2 recent auto BMT. She is taking Ensure.  She has appetite; and thus I do not think that she would benefit from appetite stimulant.  She has no anemia or abdominal pain to suggest GI malignancy.  If her symptoms worsen, I may consider further work up.  7. Follow up;  Weekly lab and Velcade SQ (weekly, 3 weeks on, 1 week off).  RV in about 1 month

## 2012-03-23 LAB — PROTEIN ELECTROPHORESIS, SERUM
Albumin ELP: 61.1 % (ref 55.8–66.1)
Beta Globulin: 4.9 % (ref 4.7–7.2)
Total Protein, Serum Electrophoresis: 6.8 g/dL (ref 6.0–8.3)

## 2012-03-23 LAB — COMPREHENSIVE METABOLIC PANEL
Albumin: 4.5 g/dL (ref 3.5–5.2)
Alkaline Phosphatase: 103 U/L (ref 39–117)
BUN: 11 mg/dL (ref 6–23)
CO2: 22 mEq/L (ref 19–32)
Calcium: 9.6 mg/dL (ref 8.4–10.5)
Chloride: 104 mEq/L (ref 96–112)
Glucose, Bld: 114 mg/dL — ABNORMAL HIGH (ref 70–99)
Potassium: 4.1 mEq/L (ref 3.5–5.3)
Sodium: 137 mEq/L (ref 135–145)
Total Protein: 6.8 g/dL (ref 6.0–8.3)

## 2012-03-23 LAB — KAPPA/LAMBDA LIGHT CHAINS
Kappa:Lambda Ratio: 1.6 (ref 0.26–1.65)
Lambda Free Lght Chn: 0.72 mg/dL (ref 0.57–2.63)

## 2012-03-25 ENCOUNTER — Telehealth: Payer: Self-pay | Admitting: Oncology

## 2012-03-25 NOTE — Telephone Encounter (Signed)
called pt lmovm for appt son 05/31-06/28.  asked pt to rtn call to confirm appts

## 2012-03-30 ENCOUNTER — Encounter: Payer: Self-pay | Admitting: Dietician

## 2012-03-30 NOTE — Progress Notes (Signed)
Brief Outpatient Oncology Nutrition Note  Reason: Positive Nutrition Risk Screen for unintentional weight loss and decreased appetite.  Kendra Johns is a 61 year old patient of Dr. Gaylyn Rong, diagnosed with multiple myeloma. Contacted [name] via home telephone number for nutrition risk. She reported her appetite is getting better. She stated she had lost weight last week because she was not eating. She stated now she eats every few hours throughout the day even if she is not hungry. She reported to drink 3 Ensure nutrition supplements daily. She reported she knows this is what she needs to do to keep her weight stable.   Height: 5'4" Weight: 119 lb BMI: 20.42 kg/m^2 (WNL)  Wt Readings from Last 10 Encounters:  03/21/12 119 lb (53.978 kg)  01/12/12 128 lb (58.06 kg)  11/26/11 134 lb (60.782 kg)  11/02/11 132 lb 12.8 oz (60.238 kg)  10/05/11 129 lb 8 oz (58.741 kg)  09/14/11 131 lb 1.6 oz (59.467 kg)  09/07/11 129 lb 9 oz (58.769 kg)  08/10/11 129 lb 14.4 oz (58.922 kg)  04/06/11 122 lb (55.339 kg)  03/17/11 125 lb (56.7 kg)  *Unintentional weight loss of 15 lb over 4 months, 11.1% from baseline.  I have encouraged the patient to continue to eat small frequent meals throughout the day and drink 3 Ensure nutrition supplements. She is without any nutrition related questions at this time.   RD available for nutrition needs.  Iven Finn [facility] [phone]

## 2012-04-01 ENCOUNTER — Ambulatory Visit: Payer: Medicare Other

## 2012-04-01 ENCOUNTER — Other Ambulatory Visit (HOSPITAL_BASED_OUTPATIENT_CLINIC_OR_DEPARTMENT_OTHER): Payer: Medicare Other | Admitting: Lab

## 2012-04-01 ENCOUNTER — Ambulatory Visit (HOSPITAL_BASED_OUTPATIENT_CLINIC_OR_DEPARTMENT_OTHER): Payer: Medicare Other

## 2012-04-01 VITALS — BP 102/76 | HR 82 | Temp 97.5°F

## 2012-04-01 DIAGNOSIS — C9000 Multiple myeloma not having achieved remission: Secondary | ICD-10-CM

## 2012-04-01 DIAGNOSIS — Z5112 Encounter for antineoplastic immunotherapy: Secondary | ICD-10-CM

## 2012-04-01 LAB — CBC WITH DIFFERENTIAL/PLATELET
Eosinophils Absolute: 0.3 10*3/uL (ref 0.0–0.5)
MONO#: 0.5 10*3/uL (ref 0.1–0.9)
NEUT#: 3.2 10*3/uL (ref 1.5–6.5)
RBC: 4.63 10*6/uL (ref 3.70–5.45)
RDW: 13.4 % (ref 11.2–14.5)
WBC: 5.7 10*3/uL (ref 3.9–10.3)
lymph#: 1.7 10*3/uL (ref 0.9–3.3)

## 2012-04-01 MED ORDER — BORTEZOMIB CHEMO SQ INJECTION 3.5 MG (2.5MG/ML)
1.3000 mg/m2 | Freq: Once | INTRAMUSCULAR | Status: AC
  Administered 2012-04-01: 2 mg via SUBCUTANEOUS
  Filled 2012-04-01: qty 2

## 2012-04-01 MED ORDER — ONDANSETRON HCL 8 MG PO TABS
8.0000 mg | ORAL_TABLET | Freq: Once | ORAL | Status: AC
Start: 2012-04-01 — End: 2012-04-01
  Administered 2012-04-01: 8 mg via ORAL

## 2012-04-08 ENCOUNTER — Ambulatory Visit: Payer: Medicare Other

## 2012-04-08 ENCOUNTER — Ambulatory Visit (HOSPITAL_BASED_OUTPATIENT_CLINIC_OR_DEPARTMENT_OTHER): Payer: Medicare Other

## 2012-04-08 ENCOUNTER — Other Ambulatory Visit (HOSPITAL_BASED_OUTPATIENT_CLINIC_OR_DEPARTMENT_OTHER): Payer: Medicare Other | Admitting: Lab

## 2012-04-08 VITALS — BP 120/78 | HR 87 | Temp 98.0°F

## 2012-04-08 DIAGNOSIS — C9000 Multiple myeloma not having achieved remission: Secondary | ICD-10-CM

## 2012-04-08 DIAGNOSIS — Z5112 Encounter for antineoplastic immunotherapy: Secondary | ICD-10-CM

## 2012-04-08 DIAGNOSIS — C9002 Multiple myeloma in relapse: Secondary | ICD-10-CM

## 2012-04-08 LAB — CBC WITH DIFFERENTIAL/PLATELET
Eosinophils Absolute: 0.1 10*3/uL (ref 0.0–0.5)
HCT: 44.2 % (ref 34.8–46.6)
LYMPH%: 25 % (ref 14.0–49.7)
MONO#: 0.8 10*3/uL (ref 0.1–0.9)
NEUT#: 5.2 10*3/uL (ref 1.5–6.5)
NEUT%: 63.4 % (ref 38.4–76.8)
Platelets: 222 10*3/uL (ref 145–400)
WBC: 8.1 10*3/uL (ref 3.9–10.3)

## 2012-04-08 MED ORDER — ONDANSETRON HCL 8 MG PO TABS
8.0000 mg | ORAL_TABLET | Freq: Once | ORAL | Status: AC
  Administered 2012-04-08: 8 mg via ORAL

## 2012-04-08 MED ORDER — BORTEZOMIB CHEMO SQ INJECTION 3.5 MG (2.5MG/ML)
1.3000 mg/m2 | Freq: Once | INTRAMUSCULAR | Status: AC
  Administered 2012-04-08: 2 mg via SUBCUTANEOUS
  Filled 2012-04-08: qty 2

## 2012-04-08 NOTE — Patient Instructions (Signed)
[facility] Discharge Instructions for Patients Receiving Chemotherapy  Today you received the following chemotherapy agents Velcade.  To help prevent nausea and vomiting after your treatment, we encourage you to take your nausea medication as prescribed.   If you develop nausea and vomiting that is not controlled by your nausea medication, call the clinic. If it is after clinic hours your family physician or the after hours number for the clinic or go to the Emergency Department.   BELOW ARE SYMPTOMS THAT SHOULD BE REPORTED IMMEDIATELY:  *FEVER GREATER THAN 100.5 F  *CHILLS WITH OR WITHOUT FEVER  NAUSEA AND VOMITING THAT IS NOT CONTROLLED WITH YOUR NAUSEA MEDICATION  *UNUSUAL SHORTNESS OF BREATH  *UNUSUAL BRUISING OR BLEEDING  TENDERNESS IN MOUTH AND THROAT WITH OR WITHOUT PRESENCE OF ULCERS  *URINARY PROBLEMS  *BOWEL PROBLEMS  UNUSUAL RASH Items with * indicate a potential emergency and should be followed up as soon as possible.  One of the nurses will contact you 24 hours after your treatment. Please let the nurse know about any problems that you may have experienced. Feel free to call the clinic you have any questions or concerns. The clinic phone number is [phone].   I have been informed and understand all the instructions given to me. I know to contact the clinic, my physician, or go to the Emergency Department if any problems should occur. I do not have any questions at this time, but understand that I may call the clinic during office hours   should I have any questions or need assistance in obtaining follow up care.    __________________________________________  _____________  __________ Signature of Patient or Authorized Representative            Date                   Time    __________________________________________ Nurse's Signature    

## 2012-04-11 ENCOUNTER — Encounter: Payer: Self-pay | Admitting: Family Medicine

## 2012-04-11 ENCOUNTER — Ambulatory Visit (INDEPENDENT_AMBULATORY_CARE_PROVIDER_SITE_OTHER): Payer: Medicare Other | Admitting: Family Medicine

## 2012-04-11 VITALS — BP 128/82 | Temp 98.4°F | Wt 117.0 lb

## 2012-04-11 DIAGNOSIS — F17200 Nicotine dependence, unspecified, uncomplicated: Secondary | ICD-10-CM

## 2012-04-11 DIAGNOSIS — J209 Acute bronchitis, unspecified: Secondary | ICD-10-CM

## 2012-04-11 DIAGNOSIS — C9000 Multiple myeloma not having achieved remission: Secondary | ICD-10-CM

## 2012-04-11 MED ORDER — AZITHROMYCIN 250 MG PO TABS: ORAL_TABLET | ORAL | Status: AC

## 2012-04-11 NOTE — Progress Notes (Signed)
  Subjective:    Patient ID: Kendra Johns, female    DOB: [birth year], 61 y.o.   MRN: [id number]  HPI  Patient seen for acute illness. 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 About 5 day history of cough which is mostly dry and occasionally productive. She developed laryngitis over the weekend. She's tried Mucinex without much improvement. Denies any fever but has had chills off and on.  Increased fatigue and malaise. Ongoing nicotine use less than one half pack cigarettes per day. No hemoptysis. No pleuritic pain.   Review of Systems  Constitutional: Positive for chills and fatigue. Negative for fever.  Respiratory: Positive for cough. Negative for shortness of breath.   Cardiovascular: Negative for chest pain.       Objective:   Physical Exam  Constitutional: She appears well-developed and well-nourished.  HENT:  Right Ear: External ear normal.  Left Ear: External ear normal.  Mouth/Throat: Oropharynx is clear and moist.  Neck: Neck supple.  Cardiovascular: Normal rate and regular rhythm.   Pulmonary/Chest:       Few faint wheezes. No rales. No retractions.  Musculoskeletal: She exhibits no edema.          Assessment & Plan:  Acute bronchitis. Patient has increased risk factors including ongoing nicotine use and multiple myeloma with recent stem cell transplant. Start Zithromax and treat for 5 days. Continue Mucinex. Followup promptly for any fever or worsening symptoms

## 2012-04-11 NOTE — Patient Instructions (Signed)

## 2012-04-15 ENCOUNTER — Other Ambulatory Visit (HOSPITAL_BASED_OUTPATIENT_CLINIC_OR_DEPARTMENT_OTHER): Payer: Medicare Other | Admitting: Lab

## 2012-04-15 ENCOUNTER — Ambulatory Visit (HOSPITAL_BASED_OUTPATIENT_CLINIC_OR_DEPARTMENT_OTHER): Payer: Medicare Other

## 2012-04-15 ENCOUNTER — Ambulatory Visit: Payer: Medicare Other

## 2012-04-15 VITALS — BP 111/76 | HR 80 | Temp 98.1°F

## 2012-04-15 DIAGNOSIS — C9000 Multiple myeloma not having achieved remission: Secondary | ICD-10-CM

## 2012-04-15 DIAGNOSIS — Z5112 Encounter for antineoplastic immunotherapy: Secondary | ICD-10-CM

## 2012-04-15 LAB — CBC WITH DIFFERENTIAL/PLATELET
Basophils Absolute: 0 10*3/uL (ref 0.0–0.1)
EOS%: 2.7 % (ref 0.0–7.0)
Eosinophils Absolute: 0.2 10*3/uL (ref 0.0–0.5)
HGB: 14.8 g/dL (ref 11.6–15.9)
MCH: 31.4 pg (ref 25.1–34.0)
NEUT#: 4.2 10*3/uL (ref 1.5–6.5)
RBC: 4.71 10*6/uL (ref 3.70–5.45)
RDW: 13 % (ref 11.2–14.5)
lymph#: 1.9 10*3/uL (ref 0.9–3.3)
nRBC: 0 % (ref 0–0)

## 2012-04-15 MED ORDER — ONDANSETRON HCL 8 MG PO TABS
8.0000 mg | ORAL_TABLET | Freq: Once | ORAL | Status: AC
  Administered 2012-04-15: 8 mg via ORAL

## 2012-04-15 MED ORDER — BORTEZOMIB CHEMO SQ INJECTION 3.5 MG (2.5MG/ML)
1.3000 mg/m2 | Freq: Once | INTRAMUSCULAR | Status: AC
  Administered 2012-04-15: 2 mg via SUBCUTANEOUS
  Filled 2012-04-15: qty 2

## 2012-04-15 NOTE — Patient Instructions (Signed)
[facility] Discharge Instructions for Patients Receiving Chemotherapy  Today you received the following chemotherapy agents Velcade.  To help prevent nausea and vomiting after your treatment, we encourage you to take your nausea medication as prescribed.   If you develop nausea and vomiting that is not controlled by your nausea medication, call the clinic. If it is after clinic hours your family physician or the after hours number for the clinic or go to the Emergency Department.   BELOW ARE SYMPTOMS THAT SHOULD BE REPORTED IMMEDIATELY:  *FEVER GREATER THAN 100.5 F  *CHILLS WITH OR WITHOUT FEVER  NAUSEA AND VOMITING THAT IS NOT CONTROLLED WITH YOUR NAUSEA MEDICATION  *UNUSUAL SHORTNESS OF BREATH  *UNUSUAL BRUISING OR BLEEDING  TENDERNESS IN MOUTH AND THROAT WITH OR WITHOUT PRESENCE OF ULCERS  *URINARY PROBLEMS  *BOWEL PROBLEMS  UNUSUAL RASH Items with * indicate a potential emergency and should be followed up as soon as possible.  One of the nurses will contact you 24 hours after your treatment. Please let the nurse know about any problems that you may have experienced. Feel free to call the clinic you have any questions or concerns. The clinic phone number is [phone].   I have been informed and understand all the instructions given to me. I know to contact the clinic, my physician, or go to the Emergency Department if any problems should occur. I do not have any questions at this time, but understand that I may call the clinic during office hours   should I have any questions or need assistance in obtaining follow up care.    __________________________________________  _____________  __________ Signature of Patient or Authorized Representative            Date                   Time    __________________________________________ Nurse's Signature    

## 2012-04-29 ENCOUNTER — Ambulatory Visit (HOSPITAL_BASED_OUTPATIENT_CLINIC_OR_DEPARTMENT_OTHER): Payer: Medicare Other | Admitting: Oncology

## 2012-04-29 ENCOUNTER — Encounter: Payer: Self-pay | Admitting: Oncology

## 2012-04-29 ENCOUNTER — Ambulatory Visit: Payer: Medicare Other | Admitting: Oncology

## 2012-04-29 ENCOUNTER — Other Ambulatory Visit (HOSPITAL_BASED_OUTPATIENT_CLINIC_OR_DEPARTMENT_OTHER): Payer: Medicare Other | Admitting: Lab

## 2012-04-29 ENCOUNTER — Ambulatory Visit (HOSPITAL_BASED_OUTPATIENT_CLINIC_OR_DEPARTMENT_OTHER): Payer: Medicare Other

## 2012-04-29 ENCOUNTER — Telehealth: Payer: Self-pay | Admitting: Oncology

## 2012-04-29 ENCOUNTER — Ambulatory Visit: Payer: Medicare Other

## 2012-04-29 VITALS — BP 127/85 | HR 87 | Temp 97.8°F | Ht 64.0 in | Wt 113.2 lb

## 2012-04-29 DIAGNOSIS — C9000 Multiple myeloma not having achieved remission: Secondary | ICD-10-CM

## 2012-04-29 DIAGNOSIS — M25559 Pain in unspecified hip: Secondary | ICD-10-CM

## 2012-04-29 DIAGNOSIS — R634 Abnormal weight loss: Secondary | ICD-10-CM

## 2012-04-29 DIAGNOSIS — Z5112 Encounter for antineoplastic immunotherapy: Secondary | ICD-10-CM

## 2012-04-29 DIAGNOSIS — I10 Essential (primary) hypertension: Secondary | ICD-10-CM

## 2012-04-29 LAB — CBC WITH DIFFERENTIAL/PLATELET
BASO%: 0.7 % (ref 0.0–2.0)
Eosinophils Absolute: 0.2 10*3/uL (ref 0.0–0.5)
LYMPH%: 24.8 % (ref 14.0–49.7)
MCHC: 33.6 g/dL (ref 31.5–36.0)
MONO#: 0.5 10*3/uL (ref 0.1–0.9)
NEUT#: 4 10*3/uL (ref 1.5–6.5)
Platelets: 204 10*3/uL (ref 145–400)
RBC: 4.93 10*6/uL (ref 3.70–5.45)
WBC: 6.2 10*3/uL (ref 3.9–10.3)
lymph#: 1.5 10*3/uL (ref 0.9–3.3)

## 2012-04-29 MED ORDER — ONDANSETRON HCL 8 MG PO TABS
8.0000 mg | ORAL_TABLET | Freq: Once | ORAL | Status: AC
  Administered 2012-04-29: 8 mg via ORAL

## 2012-04-29 MED ORDER — BORTEZOMIB CHEMO SQ INJECTION 3.5 MG (2.5MG/ML)
1.3000 mg/m2 | Freq: Once | INTRAMUSCULAR | Status: AC
  Administered 2012-04-29: 2 mg via SUBCUTANEOUS
  Filled 2012-04-29: qty 2

## 2012-04-29 NOTE — Progress Notes (Signed)
[facility]  Telephone:([phone] [phone]) [phone]   OFFICE PROGRESS NOTE   Cc:  [name], MD  PAST DIAGNOSIS: multiple myeloma presented with bony disease and slight increase in her light chain. Initial diagnosis was in September 2007. She had presented initially with a plasmacytoma. Subsequently, she had developed multiple myeloma.   PAST THERAPY:  1. 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 She has received total of 45 Gy in 25 fractions in March 2008.  2. She received induction therapy with Revlimid and dexamethasone. Subsequently, received Velcade and dexamethasone until September 2009.  3. She received high dose chemotherapy and stem cell transplant in September 2009.  4. She had a complete response. She has been on active surveillance since that time.  5. 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 She received salvage chemo Velcade/Cytoxan/Dexamethasone on 05/05/11 with very good partial response. 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   CURRENT THERAPY: Velcade weekly (3 weeks on and 1 week off). Started on 04/01/12.  INTERVAL HISTORY: Kendra Johns 61 y.o. female returns for regular follow up with her daughter.  She reported that she still has chronic lower back pain radiation to the bilateral hip.  She has mild fatigue.  She is able to carry out activities of daily living. Whenever she pushes herself to go shopping or prolonged ambulation more than 10 minutes, she would develop worsened back pain for a few days afterward. The pain does not cause leg weakness, bowel/bladder incontinence, or leg paresthesia.  She does have good appetite; however, as soon as she starts eating, she feels full easily.  She has lost about 4 lbs in 2 weeks. She denies abdominal pan, nausea/vomiting, GI bleeding symptoms.   Patient denies headache,  visual changes, confusion, drenching night sweats, palpable lymph node swelling, mucositis, odynophagia, dysphagia, nausea vomiting, jaundice, chest pain, palpitation, shortness of breath, dyspnea on exertion, productive cough, gum bleeding, epistaxis, hematemesis, hemoptysis, abdominal pain, abdominal swelling, melena, hematochezia, hematuria, skin rash, spontaneous bleeding, joint swelling, joint pain, heat or cold intolerance, bowel bladder incontinence, back pain, focal motor weakness, paresthesia, depression, suicidal or homocidal ideation, feeling hopelessness.   Past Medical History  Diagnosis Date  . Depression   . Hypertension   . Hepatitis C     genotype 1  . Diverticulosis of colon   . Multiple myeloma     kappa light chain, s/p high-dose chemo/stem cell tx - [facility]  . Hx of adenomatous colonic polyps 2007  . Anxiety   . Osteoarthritis   . Sleep apnea   . Hyperlipidemia   . GERD with stricture   . External hemorrhoids   . Hepatitis B virus infection   . IBS (irritable bowel syndrome)   . Helicobacter pylori gastritis 2001    ? if treated  . Sciatic nerve pain     with Dr. Delma Officer  . Chronic low back pain     Past Surgical History  Procedure Date  . Abdominal hysterectomy   . Oophorectomy   . Dilation and curettage of uterus   . Bone marrow transplant   . Appendectomy   . Colonoscopy w/ polypectomy 08/2006    1-2 adenomas, 6 polyps total, diverticulosis and external hemorrhoids  . Upper gastrointestinal endoscopy 08/2003    esophageal stricture dilation, hiatal hernia, gastrritis  . Cholecystectomy   . Tonsillectomy and adenoidectomy  Current Outpatient Prescriptions  Medication Sig Dispense Refill  . acyclovir (ZOVIRAX) 400 MG tablet Take 400 mg by mouth 2 (two) times daily.        . Bisacodyl (LAXATIVE PO) Take by mouth. As needed       . diazepam (VALIUM) 5 MG tablet Take 1 tablet (5 mg total) by mouth as needed.  20 tablet  3  . DULoxetine  (CYMBALTA) 30 MG capsule Take 30 mg by mouth daily.      . Magnesium Hydroxide (MILK OF MAGNESIA PO) Take by mouth daily as needed.        Marland Kitchen morphine (MS CONTIN) 60 MG 12 hr tablet Take 60 mg by mouth every 8 (eight) hours.       Marland Kitchen oxymorphone (OPANA) 10 MG tablet Take 10 mg by mouth every 6 (six) hours as needed. 1-2 tabs      . polyethylene glycol (MIRALAX / GLYCOLAX) packet Take 17 g by mouth every other day.        . prochlorperazine (COMPAZINE) 10 MG tablet Take 10 mg by mouth every 6 (six) hours as needed.        . senna-docusate (SENOKOT-S) 8.6-50 MG per tablet Take 2 tablets by mouth daily.      . Sodium Phosphates (ENEMA RE) Place rectally. As needed       . DISCONTD: amLODipine (NORVASC) 5 MG tablet Take 1 tablet (5 mg total) by mouth daily.  90 tablet  3  . DISCONTD: gabapentin (NEURONTIN) 600 MG tablet Take 600 mg by mouth 3 (three) times daily.        Marland Kitchen DISCONTD: lisinopril (PRINIVIL,ZESTRIL) 20 MG tablet Take 1 tablet (20 mg total) by mouth daily.  90 tablet  11   No current facility-administered medications for this visit.   Facility-Administered Medications Ordered in Other Visits  Medication Dose Route Frequency Provider Last Rate Last Dose  . bortezomib SQ (VELCADE) chemo injection 2 mg  1.3 mg/m2 (Treatment Plan Actual) Subcutaneous Once Exie Parody, MD   2 mg at 04/29/12 1212  . ondansetron (ZOFRAN) tablet 8 mg  8 mg Oral Once Exie Parody, MD   8 mg at 04/29/12 1200    ALLERGIES:  is allergic to codeine and ibuprofen.  REVIEW OF SYSTEMS:  The rest of the 14-point review of system was negative.   Filed Vitals:   04/29/12 1122  BP: 127/85  Pulse: 87  Temp: 97.8 F (36.6 C)   Wt Readings from Last 3 Encounters:  04/29/12 113 lb 3.2 oz (51.347 kg)  04/11/12 117 lb (53.071 kg)  03/21/12 119 lb (53.978 kg)   ECOG Performance status: 1-2  PHYSICAL EXAMINATION:   She was able to get on and off the exam table by herself. There was no pain upon lying down. General:  thin-appearing woman, in no acute distress. Eyes: no scleral icterus. ENT: There were no oropharyngeal lesions. Neck was without thyromegaly. Lymphatics: Negative cervical, supraclavicular or axillary adenopathy. Respiratory: lungs were clear bilaterally without wheezing or crackles. Cardiovascular: Regular rate and rhythm, S1/S2, without murmur, rub or gallop. There was no pedal edema. GI: abdomen was soft, flat, nontender, nondistended, without organomegaly. Muscoloskeletal: no spinal tenderness of palpation of vertebral spine. Skin exam was without echymosis, petichae. Neuro exam was nonfocal. Patient was able to get on and off exam table without assistance. Gait was normal. Patient was alerted and oriented. Attention was good. Language was appropriate. Mood was normal without depression. Speech was not pressured. Thought  content was not tangential.   LABORATORY/RADIOLOGY DATA:  Lab Results  Component Value Date   WBC 6.2 04/29/2012   HGB 15.5 04/29/2012   HCT 46.0 04/29/2012   PLT 204 04/29/2012   GLUCOSE 114* 03/21/2012   CHOL 240* 02/03/2011   TRIG 93.0 02/03/2011   HDL 43.00 02/03/2011   LDLDIRECT 176.9 02/03/2011   LDLCALC  Value: 112        Total Cholesterol/HDL:CHD Risk Coronary Heart Disease Risk Table                     Men   Women  1/2 Average Risk   3.4   3.3* 11/26/2007   ALKPHOS 103 03/21/2012   ALT 80* 03/21/2012   AST 88* 03/21/2012   NA 137 03/21/2012   K 4.1 03/21/2012   CL 104 03/21/2012   CREATININE 0.79 03/21/2012   BUN 11 03/21/2012   CO2 22 03/21/2012   INR 1.0 03/12/2008   HGBA1C  Value: 5.8 (NOTE)   The ADA recommends the following therapeutic goals for glycemic   control related to Hgb A1C measurement:   Goal of Therapy:   < 7.0% Hgb A1C   Action Suggested:  > 8.0% Hgb A1C   Ref:  Diabetes Care, 22, Suppl. 1, 1999 11/26/2007   ASSESSMENT AND PLAN:   1. Multiple myeloma: She is s/p 2nd auto BMT. Her CBC has completely normalized. She is due for follow-up with [facility] in August.She is on  maintenance Velcade since she had remarkable response to it in the salvage setting before the 2nd transplant.  She has grade 1 neuropathy, but this does not warrant dose modification. I have recommended that she proceed with Velcade today and again on 7/5, and 7/12.   2. History of cirrhosis and hepatitis C: Diagnosed in 2009 with [facility] when she underwent the 1st transplant evaluation. She is compensated without encephalopathy, ascites, bleeding, cytopenia. She does not drink EtOH.   3. Prophylaxis: she is on acyclovir 400 mg p.o. b.i.d. to decrease her risk of herpes simplex virus reactivation while on Velcade.  4. Pain control in the right hip: Either multiple myeloma versus the DJD versus sciatic nerve. She has a controlled with the current regimen of morphine sulfate MS Contin and breakthrough pain with Opana by Dr. [name]. I appreciate his assistance. Past work up did not show evidence of cord compression.  5. Hypertension: now normotensive due to recent weight loss from BMT.  She is off of BP med for now.  6. Anorexia, weight loss: Due to recent auto BMT. She is taking Ensure.  She has appetite; and thus I do not think that she would benefit from appetite stimulant.  She has no anemia or abdominal pain to suggest GI malignancy.  If her symptoms worsen, I may consider further work up.  7. Follow up;  Weekly lab and Velcade SQ (weekly, 3 weeks on, 1 week off).  RV in about 1 month.

## 2012-04-29 NOTE — Patient Instructions (Signed)

## 2012-04-29 NOTE — Telephone Encounter (Signed)
Gave pt appt for July 2013 lab, chemo and MD 

## 2012-04-29 NOTE — Progress Notes (Signed)
Put disability form on nurse's desk. °

## 2012-05-02 ENCOUNTER — Encounter: Payer: Self-pay | Admitting: Oncology

## 2012-05-02 NOTE — Progress Notes (Signed)
Faxed disability form to [phone].

## 2012-05-03 LAB — COMPREHENSIVE METABOLIC PANEL
Alkaline Phosphatase: 76 U/L (ref 39–117)
Creatinine, Ser: 0.84 mg/dL (ref 0.50–1.10)
Glucose, Bld: 105 mg/dL — ABNORMAL HIGH (ref 70–99)
Sodium: 140 mEq/L (ref 135–145)
Total Bilirubin: 0.4 mg/dL (ref 0.3–1.2)
Total Protein: 7.2 g/dL (ref 6.0–8.3)

## 2012-05-03 LAB — PROTEIN ELECTROPHORESIS, SERUM
Albumin ELP: 59.3 % (ref 55.8–66.1)
Alpha-1-Globulin: 5.3 % — ABNORMAL HIGH (ref 2.9–4.9)
Beta 2: 2.6 % — ABNORMAL LOW (ref 3.2–6.5)
Total Protein, Serum Electrophoresis: 7.2 g/dL (ref 6.0–8.3)

## 2012-05-03 LAB — KAPPA/LAMBDA LIGHT CHAINS
Kappa:Lambda Ratio: 3.11 — ABNORMAL HIGH (ref 0.26–1.65)
Lambda Free Lght Chn: 0.73 mg/dL (ref 0.57–2.63)

## 2012-05-06 ENCOUNTER — Other Ambulatory Visit (HOSPITAL_BASED_OUTPATIENT_CLINIC_OR_DEPARTMENT_OTHER): Payer: Medicare Other

## 2012-05-06 ENCOUNTER — Ambulatory Visit (HOSPITAL_BASED_OUTPATIENT_CLINIC_OR_DEPARTMENT_OTHER): Payer: Medicare Other

## 2012-05-06 VITALS — BP 130/94 | HR 82 | Temp 98.7°F

## 2012-05-06 DIAGNOSIS — C9000 Multiple myeloma not having achieved remission: Secondary | ICD-10-CM

## 2012-05-06 DIAGNOSIS — Z5112 Encounter for antineoplastic immunotherapy: Secondary | ICD-10-CM

## 2012-05-06 LAB — CBC WITH DIFFERENTIAL/PLATELET
BASO%: 0.7 % (ref 0.0–2.0)
EOS%: 8.5 % — ABNORMAL HIGH (ref 0.0–7.0)
MCH: 31 pg (ref 25.1–34.0)
MCHC: 34.7 g/dL (ref 31.5–36.0)
MCV: 89.2 fL (ref 79.5–101.0)
MONO%: 9 % (ref 0.0–14.0)
RBC: 4.91 10*6/uL (ref 3.70–5.45)
RDW: 13.3 % (ref 11.2–14.5)
nRBC: 0 % (ref 0–0)

## 2012-05-06 MED ORDER — BORTEZOMIB CHEMO SQ INJECTION 3.5 MG (2.5MG/ML)
1.3000 mg/m2 | Freq: Once | INTRAMUSCULAR | Status: AC
  Administered 2012-05-06: 2 mg via SUBCUTANEOUS
  Filled 2012-05-06: qty 2

## 2012-05-06 MED ORDER — ONDANSETRON HCL 8 MG PO TABS
8.0000 mg | ORAL_TABLET | Freq: Once | ORAL | Status: AC
  Administered 2012-05-06: 8 mg via ORAL

## 2012-05-06 NOTE — Patient Instructions (Signed)
[facility] Discharge Instructions for Patients Receiving Chemotherapy  Today you received the following chemotherapy agents Velcade.  To help prevent nausea and vomiting after your treatment, we encourage you to take your nausea medication as prescribed.   If you develop nausea and vomiting that is not controlled by your nausea medication, call the clinic. If it is after clinic hours your family physician or the after hours number for the clinic or go to the Emergency Department.   BELOW ARE SYMPTOMS THAT SHOULD BE REPORTED IMMEDIATELY:  *FEVER GREATER THAN 100.5 F  *CHILLS WITH OR WITHOUT FEVER  NAUSEA AND VOMITING THAT IS NOT CONTROLLED WITH YOUR NAUSEA MEDICATION  *UNUSUAL SHORTNESS OF BREATH  *UNUSUAL BRUISING OR BLEEDING  TENDERNESS IN MOUTH AND THROAT WITH OR WITHOUT PRESENCE OF ULCERS  *URINARY PROBLEMS  *BOWEL PROBLEMS  UNUSUAL RASH Items with * indicate a potential emergency and should be followed up as soon as possible.  One of the nurses will contact you 24 hours after your treatment. Please let the nurse know about any problems that you may have experienced. Feel free to call the clinic you have any questions or concerns. The clinic phone number is [phone].   I have been informed and understand all the instructions given to me. I know to contact the clinic, my physician, or go to the Emergency Department if any problems should occur. I do not have any questions at this time, but understand that I may call the clinic during office hours   should I have any questions or need assistance in obtaining follow up care.    __________________________________________  _____________  __________ Signature of Patient or Authorized Representative            Date                   Time    __________________________________________ Nurse's Signature    

## 2012-05-13 ENCOUNTER — Ambulatory Visit (HOSPITAL_BASED_OUTPATIENT_CLINIC_OR_DEPARTMENT_OTHER): Payer: Medicare Other

## 2012-05-13 ENCOUNTER — Other Ambulatory Visit (HOSPITAL_BASED_OUTPATIENT_CLINIC_OR_DEPARTMENT_OTHER): Payer: Medicare Other | Admitting: Lab

## 2012-05-13 VITALS — BP 150/90 | HR 87 | Temp 97.8°F

## 2012-05-13 DIAGNOSIS — C9000 Multiple myeloma not having achieved remission: Secondary | ICD-10-CM

## 2012-05-13 DIAGNOSIS — Z5112 Encounter for antineoplastic immunotherapy: Secondary | ICD-10-CM

## 2012-05-13 LAB — CBC WITH DIFFERENTIAL/PLATELET
BASO%: 0.6 % (ref 0.0–2.0)
LYMPH%: 25.6 % (ref 14.0–49.7)
MCHC: 34.5 g/dL (ref 31.5–36.0)
MONO#: 0.5 10*3/uL (ref 0.1–0.9)
Platelets: 165 10*3/uL (ref 145–400)
RBC: 5 10*6/uL (ref 3.70–5.45)
WBC: 7.3 10*3/uL (ref 3.9–10.3)
lymph#: 1.9 10*3/uL (ref 0.9–3.3)
nRBC: 0 % (ref 0–0)

## 2012-05-13 MED ORDER — ONDANSETRON HCL 8 MG PO TABS
8.0000 mg | ORAL_TABLET | Freq: Once | ORAL | Status: AC
  Administered 2012-05-13: 8 mg via ORAL

## 2012-05-13 MED ORDER — BORTEZOMIB CHEMO SQ INJECTION 3.5 MG (2.5MG/ML)
1.3000 mg/m2 | Freq: Once | INTRAMUSCULAR | Status: AC
  Administered 2012-05-13: 2 mg via SUBCUTANEOUS
  Filled 2012-05-13: qty 0.8

## 2012-05-13 NOTE — Patient Instructions (Signed)
[facility] Discharge Instructions for Patients Receiving Chemotherapy  Today you received the following chemotherapy agents: Velcade  To help prevent nausea and vomiting after your treatment, we encourage you to take your nausea medication.  Begin taking it at 2 pm and take it as often as prescribed for the next 48-72 hours.   If you develop nausea and vomiting that is not controlled by your nausea medication, call the clinic. If it is after clinic hours your family physician or the after hours number for the clinic or go to the Emergency Department.   BELOW ARE SYMPTOMS THAT SHOULD BE REPORTED IMMEDIATELY:  *FEVER GREATER THAN 100.5 F  *CHILLS WITH OR WITHOUT FEVER  NAUSEA AND VOMITING THAT IS NOT CONTROLLED WITH YOUR NAUSEA MEDICATION  *UNUSUAL SHORTNESS OF BREATH  *UNUSUAL BRUISING OR BLEEDING  TENDERNESS IN MOUTH AND THROAT WITH OR WITHOUT PRESENCE OF ULCERS  *URINARY PROBLEMS  *BOWEL PROBLEMS  UNUSUAL RASH Items with * indicate a potential emergency and should be followed up as soon as possible.  Feel free to call the clinic if you have any questions or concerns. The clinic phone number is [phone].   I have been informed and understand all the instructions given to me. I know to contact the clinic, my physician, or go to the Emergency Department if any problems should occur. I do not have any questions at this time, but understand that I may call the clinic during office hours   should I have any questions or need assistance in obtaining follow up care.    __________________________________________  _____________  __________ Signature of Patient or Authorized Representative            Date                   Time    __________________________________________ Nurse's Signature

## 2012-05-27 ENCOUNTER — Other Ambulatory Visit (HOSPITAL_BASED_OUTPATIENT_CLINIC_OR_DEPARTMENT_OTHER): Payer: Medicare Other | Admitting: Lab

## 2012-05-27 ENCOUNTER — Ambulatory Visit (HOSPITAL_BASED_OUTPATIENT_CLINIC_OR_DEPARTMENT_OTHER): Payer: Medicare Other | Admitting: Oncology

## 2012-05-27 ENCOUNTER — Ambulatory Visit (HOSPITAL_BASED_OUTPATIENT_CLINIC_OR_DEPARTMENT_OTHER): Payer: Medicare Other

## 2012-05-27 ENCOUNTER — Telehealth: Payer: Self-pay | Admitting: Oncology

## 2012-05-27 VITALS — BP 128/92 | HR 77 | Temp 97.3°F | Ht 64.0 in | Wt 114.7 lb

## 2012-05-27 DIAGNOSIS — C9000 Multiple myeloma not having achieved remission: Secondary | ICD-10-CM

## 2012-05-27 DIAGNOSIS — Z5112 Encounter for antineoplastic immunotherapy: Secondary | ICD-10-CM

## 2012-05-27 LAB — CBC WITH DIFFERENTIAL/PLATELET
Basophils Absolute: 0 10*3/uL (ref 0.0–0.1)
Eosinophils Absolute: 0.3 10*3/uL (ref 0.0–0.5)
HCT: 43.8 % (ref 34.8–46.6)
HGB: 14.8 g/dL (ref 11.6–15.9)
MCH: 31.2 pg (ref 25.1–34.0)
MCV: 92.2 fL (ref 79.5–101.0)
MONO%: 6.3 % (ref 0.0–14.0)
NEUT#: 3.5 10*3/uL (ref 1.5–6.5)
NEUT%: 63.2 % (ref 38.4–76.8)
RDW: 14.3 % (ref 11.2–14.5)
lymph#: 1.3 10*3/uL (ref 0.9–3.3)

## 2012-05-27 MED ORDER — BORTEZOMIB CHEMO SQ INJECTION 3.5 MG (2.5MG/ML)
1.3000 mg/m2 | Freq: Once | INTRAMUSCULAR | Status: AC
  Administered 2012-05-27: 2 mg via SUBCUTANEOUS
  Filled 2012-05-27: qty 2

## 2012-05-27 MED ORDER — ONDANSETRON HCL 8 MG PO TABS
8.0000 mg | ORAL_TABLET | Freq: Once | ORAL | Status: AC
  Administered 2012-05-27: 8 mg via ORAL

## 2012-05-27 NOTE — Patient Instructions (Signed)
[facility] Discharge Instructions for Patients Receiving Chemotherapy  Today you received the following chemotherapy agents Velcade.  To help prevent nausea and vomiting after your treatment, we encourage you to take your nausea medication as prescribed.   If you develop nausea and vomiting that is not controlled by your nausea medication, call the clinic. If it is after clinic hours your family physician or the after hours number for the clinic or go to the Emergency Department.   BELOW ARE SYMPTOMS THAT SHOULD BE REPORTED IMMEDIATELY:  *FEVER GREATER THAN 100.5 F  *CHILLS WITH OR WITHOUT FEVER  NAUSEA AND VOMITING THAT IS NOT CONTROLLED WITH YOUR NAUSEA MEDICATION  *UNUSUAL SHORTNESS OF BREATH  *UNUSUAL BRUISING OR BLEEDING  TENDERNESS IN MOUTH AND THROAT WITH OR WITHOUT PRESENCE OF ULCERS  *URINARY PROBLEMS  *BOWEL PROBLEMS  UNUSUAL RASH Items with * indicate a potential emergency and should be followed up as soon as possible.  One of the nurses will contact you 24 hours after your treatment. Please let the nurse know about any problems that you may have experienced. Feel free to call the clinic you have any questions or concerns. The clinic phone number is [phone].   I have been informed and understand all the instructions given to me. I know to contact the clinic, my physician, or go to the Emergency Department if any problems should occur. I do not have any questions at this time, but understand that I may call the clinic during office hours   should I have any questions or need assistance in obtaining follow up care.    __________________________________________  _____________  __________ Signature of Patient or Authorized Representative            Date                   Time    __________________________________________ Nurse's Signature    

## 2012-05-27 NOTE — Progress Notes (Signed)
[facility]  Telephone:(336) [phone] Fax:(336) [phone]   OFFICE PROGRESS NOTE   Cc:  [name], MD   PAST DIAGNOSIS: multiple myeloma presented with bony disease and slight increase in her light chain. Initial diagnosis was in September 2007. She had presented initially with a plasmacytoma. Subsequently, she had developed multiple myeloma.   PAST THERAPY:  1. 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 She has received total of 45 Gy in 25 fractions in March 2008.  2. She received induction therapy with Revlimid and dexamethasone. Subsequently, received Velcade and dexamethasone until September 2009.  3. She received high dose chemotherapy and stem cell transplant in September 2009.  4. She had a complete response. She has been on active surveillance since that time.  5. 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 She received salvage chemo Velcade/Cytoxan/Dexamethasone on 05/05/11 with very good partial response. 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   CURRENT THERAPY: she started maintenance therapy with SQ Velcade weekly, 3 weeks on, 1 week off on Apr 01, 2012.   INTERVAL HISTORY: Kendra Johns 61 y.o. female returns for regular follow up by herself.  She still has back pain radiating to the right hip.  However, the lower back pain is now minimal.  Her pain medication regimen is having very good control.  The pain in the leg comes on when she exerts herself.  She has been on maintenance SQ Velcade without problem.  She denied severe fatigue, fever, mucositis, neuropathy, bleeding, skin rash, lower extremity weakness, bowel/bladder incontinence.   Past Medical History  Diagnosis Date  . Depression   . Hypertension   . Hepatitis C     genotype 1  . Diverticulosis of colon   . Multiple myeloma     kappa light chain, s/p high-dose  chemo/stem cell tx - [facility]  . Hx of adenomatous colonic polyps 2007  . Anxiety   . Osteoarthritis   . Sleep apnea   . Hyperlipidemia   . GERD with stricture   . External hemorrhoids   . Hepatitis B virus infection   . IBS (irritable bowel syndrome)   . Helicobacter pylori gastritis 2001    ? if treated  . Sciatic nerve pain     with Dr. Delma Officer  . Chronic low back pain     Past Surgical History  Procedure Date  . Abdominal hysterectomy   . Oophorectomy   . Dilation and curettage of uterus   . Bone marrow transplant   . Appendectomy   . Colonoscopy w/ polypectomy 08/2006    1-2 adenomas, 6 polyps total, diverticulosis and external hemorrhoids  . Upper gastrointestinal endoscopy 08/2003    esophageal stricture dilation, hiatal hernia, gastrritis  . Cholecystectomy   . Tonsillectomy and adenoidectomy     Current Outpatient Prescriptions  Medication Sig Dispense Refill  . acyclovir (ZOVIRAX) 400 MG tablet Take 400 mg by mouth 2 (two) times daily.        . Bisacodyl (LAXATIVE PO) Take by mouth. As needed       . diazepam (VALIUM) 5 MG tablet Take 1 tablet (5 mg total) by mouth as needed.  20 tablet  3  . DULoxetine (CYMBALTA) 30 MG capsule Take 30 mg by mouth daily.      . Magnesium Hydroxide (MILK OF MAGNESIA PO) Take by mouth daily as needed.        Marland Kitchen  morphine (MS CONTIN) 60 MG 12 hr tablet Take 60 mg by mouth every 8 (eight) hours.       Marland Kitchen oxymorphone (OPANA) 10 MG tablet Take 10 mg by mouth every 6 (six) hours as needed. 1-2 tabs      . polyethylene glycol (MIRALAX / GLYCOLAX) packet Take 17 g by mouth every other day.        . prochlorperazine (COMPAZINE) 10 MG tablet Take 10 mg by mouth every 6 (six) hours as needed.        . senna-docusate (SENOKOT-S) 8.6-50 MG per tablet Take 2 tablets by mouth daily.      . Sodium Phosphates (ENEMA RE) Place rectally. As needed       . DISCONTD: amLODipine (NORVASC) 5 MG tablet Take 1 tablet (5 mg total) by mouth daily.  90 tablet   3  . DISCONTD: gabapentin (NEURONTIN) 600 MG tablet Take 600 mg by mouth 3 (three) times daily.        Marland Kitchen DISCONTD: lisinopril (PRINIVIL,ZESTRIL) 20 MG tablet Take 1 tablet (20 mg total) by mouth daily.  90 tablet  11    ALLERGIES:  is allergic to codeine and ibuprofen.  REVIEW OF SYSTEMS:  The rest of the 14-point review of system was negative.   Filed Vitals:   05/27/12 1046  BP: 128/92  Pulse: 77  Temp: 97.3 F (36.3 C)   Wt Readings from Last 3 Encounters:  05/27/12 114 lb 11.2 oz (52.028 kg)  04/29/12 113 lb 3.2 oz (51.347 kg)  04/11/12 117 lb (53.071 kg)   ECOG Performance status: 1-2 due to chronic hip pain.  PHYSICAL EXAMINATION:   General: thin-appearing woman, in no acute distress. Eyes: no scleral icterus. ENT: There were no oropharyngeal lesions. Neck was without thyromegaly. Lymphatics: Negative cervical, supraclavicular or axillary adenopathy. Respiratory: lungs were clear bilaterally without wheezing or crackles. Cardiovascular: Regular rate and rhythm, S1/S2, without murmur, rub or gallop. There was no pedal edema. GI: abdomen was soft, flat, nontender, nondistended, without organomegaly. Muscoloskeletal: no spinal tenderness of palpation of vertebral spine. She was able to get on and off the exam table by herself. There was no pain upon lying down.Skin exam was without echymosis, petichae. Neuro exam was nonfocal. Patient was able to get on and off exam table without assistance. Gait was normal. Patient was alerted and oriented. Attention was good. Language was appropriate. Mood was normal without depression. Speech was not pressured. Thought content was not tangential.   LABORATORY/RADIOLOGY DATA:  Lab Results  Component Value Date   WBC 5.6 05/27/2012   HGB 14.8 05/27/2012   HCT 43.8 05/27/2012   PLT 171 05/27/2012   GLUCOSE 105* 04/29/2012   CHOL 240* 02/03/2011   TRIG 93.0 02/03/2011   HDL 43.00 02/03/2011   LDLDIRECT 176.9 02/03/2011   LDLCALC  Value: 112        Total  Cholesterol/HDL:CHD Risk Coronary Heart Disease Risk Table                     Men   Women  1/2 Average Risk   3.4   3.3* 11/26/2007   ALKPHOS 76 04/29/2012   ALT 30 04/29/2012   AST 40* 04/29/2012   NA 140 04/29/2012   K 3.9 04/29/2012   CL 102 04/29/2012   CREATININE 0.84 04/29/2012   BUN 13 04/29/2012   CO2 23 04/29/2012   INR 1.0 03/12/2008   HGBA1C  Value: 5.8 (NOTE)   The ADA recommends  the following therapeutic goals for glycemic   control related to Hgb A1C measurement:   Goal of Therapy:   < 7.0% Hgb A1C   Action Suggested:  > 8.0% Hgb A1C   Ref:  Diabetes Care, 22, Suppl. 1, 1999 11/26/2007    ASSESSMENT AND PLAN:   1. Multiple myeloma: She is s/p a 2nd auto BMT. She has been on maintenance SQ Velcade since 03/2012 without problem or dose limiting toxicity.  I recommended continuing with Velcade at current dose for at least until 01/2014.  2. History of cirrhosis and hepatitis C: Diagnosed in 2009 with [facility] when she underwent the 1st transplant evaluation. She is compensated without encephalopathy, ascites, bleeding, cytopenia. She does not drink EtOH.  3. Prophylaxis: she is on acyclovir 400 mg p.o. b.i.d. to decrease her risk of herpes simplex virus reactivation while on Velcade.  4. Pain control in the right hip: Either multiple myeloma versus the DJD versus sciatic nerve. She has a controlled with the current regimen of morphine sulfate MS Contin and breakthrough pain with Opana by Dr. [name]. I appreciate his assistance. Past work up did not show evidence of cord compression.  5. Hypertension: now normotensive due to recent weight loss from BMT. She is off of BP med for now.  6. History of anorexia, weight loss: her weight is increasing now.  7. Depression:  Mood was stable today on Cymbalta per PCP.  8. Follow up; Weekly lab and Velcade SQ (weekly, 3 weeks on, 1 week off). Return visit in about 1 month.  In the future, if she tolerates Velcade well, we may consider seeing her  every 2 months.     The length of time of the face-to-face encounter was 15 minutes. More than 50% of time was spent counseling and coordination of care.

## 2012-05-27 NOTE — Telephone Encounter (Signed)
Gave pt appt for today 1:30pm for chemo , gave pt calendar appt for August, September 2013,lab, ML and chemo

## 2012-05-31 LAB — COMPREHENSIVE METABOLIC PANEL
Albumin: 4.1 g/dL (ref 3.5–5.2)
BUN: 14 mg/dL (ref 6–23)
Calcium: 9.5 mg/dL (ref 8.4–10.5)
Chloride: 104 mEq/L (ref 96–112)
Creatinine, Ser: 0.77 mg/dL (ref 0.50–1.10)
Glucose, Bld: 101 mg/dL — ABNORMAL HIGH (ref 70–99)
Potassium: 3.9 mEq/L (ref 3.5–5.3)

## 2012-05-31 LAB — KAPPA/LAMBDA LIGHT CHAINS
Kappa free light chain: 2 mg/dL — ABNORMAL HIGH (ref 0.33–1.94)
Kappa:Lambda Ratio: 2 — ABNORMAL HIGH (ref 0.26–1.65)
Lambda Free Lght Chn: 1 mg/dL (ref 0.57–2.63)

## 2012-05-31 LAB — PROTEIN ELECTROPHORESIS, SERUM
Albumin ELP: 60.8 % (ref 55.8–66.1)
Alpha-1-Globulin: 4.7 % (ref 2.9–4.9)
Alpha-2-Globulin: 13.5 % — ABNORMAL HIGH (ref 7.1–11.8)

## 2012-06-03 ENCOUNTER — Ambulatory Visit (HOSPITAL_BASED_OUTPATIENT_CLINIC_OR_DEPARTMENT_OTHER): Payer: Medicare Other

## 2012-06-03 ENCOUNTER — Other Ambulatory Visit (HOSPITAL_BASED_OUTPATIENT_CLINIC_OR_DEPARTMENT_OTHER): Payer: Medicare Other | Admitting: Lab

## 2012-06-03 VITALS — BP 110/74 | HR 85 | Temp 98.3°F

## 2012-06-03 DIAGNOSIS — Z5112 Encounter for antineoplastic immunotherapy: Secondary | ICD-10-CM

## 2012-06-03 DIAGNOSIS — C9000 Multiple myeloma not having achieved remission: Secondary | ICD-10-CM

## 2012-06-03 LAB — CBC WITH DIFFERENTIAL/PLATELET
Eosinophils Absolute: 0.3 10*3/uL (ref 0.0–0.5)
LYMPH%: 30.6 % (ref 14.0–49.7)
MCV: 87.3 fL (ref 79.5–101.0)
MONO%: 6.9 % (ref 0.0–14.0)
NEUT#: 4 10*3/uL (ref 1.5–6.5)
Platelets: 153 10*3/uL (ref 145–400)
RBC: 4.9 10*6/uL (ref 3.70–5.45)

## 2012-06-03 MED ORDER — ONDANSETRON HCL 8 MG PO TABS
8.0000 mg | ORAL_TABLET | Freq: Once | ORAL | Status: AC
  Administered 2012-06-03: 8 mg via ORAL

## 2012-06-03 MED ORDER — BORTEZOMIB CHEMO SQ INJECTION 3.5 MG (2.5MG/ML)
1.3000 mg/m2 | Freq: Once | INTRAMUSCULAR | Status: AC
  Administered 2012-06-03: 2 mg via SUBCUTANEOUS
  Filled 2012-06-03: qty 2

## 2012-06-03 NOTE — Patient Instructions (Signed)
[facility] Discharge Instructions for Patients Receiving Chemotherapy  Today you received the following chemotherapy agents Velcade  To help prevent nausea and vomiting after your treatment, we encourage you to take your nausea medication as per Dr. Gaylyn Rong If you develop nausea and vomiting that is not controlled by your nausea medication, call the clinic. If it is after clinic hours your family physician or the after hours number for the clinic or go to the Emergency Department.   BELOW ARE SYMPTOMS THAT SHOULD BE REPORTED IMMEDIATELY:  *FEVER GREATER THAN 100.5 F  *CHILLS WITH OR WITHOUT FEVER  NAUSEA AND VOMITING THAT IS NOT CONTROLLED WITH YOUR NAUSEA MEDICATION  *UNUSUAL SHORTNESS OF BREATH  *UNUSUAL BRUISING OR BLEEDING  TENDERNESS IN MOUTH AND THROAT WITH OR WITHOUT PRESENCE OF ULCERS  *URINARY PROBLEMS  *BOWEL PROBLEMS  UNUSUAL RASH Items with * indicate a potential emergency and should be followed up as soon as possible.  . Feel free to call the clinic you have any questions or concerns. The clinic phone number is [phone].   I have been informed and understand all the instructions given to me. I know to contact the clinic, my physician, or go to the Emergency Department if any problems should occur. I do not have any questions at this time, but understand that I may call the clinic during office hours   should I have any questions or need assistance in obtaining follow up care.    __________________________________________  _____________  __________ Signature of Patient or Authorized Representative            Date                   Time    __________________________________________ Nurse's Signature

## 2012-06-10 ENCOUNTER — Other Ambulatory Visit (HOSPITAL_BASED_OUTPATIENT_CLINIC_OR_DEPARTMENT_OTHER): Payer: Medicare Other

## 2012-06-10 ENCOUNTER — Ambulatory Visit (HOSPITAL_BASED_OUTPATIENT_CLINIC_OR_DEPARTMENT_OTHER): Payer: Medicare Other

## 2012-06-10 VITALS — BP 125/88 | HR 82 | Temp 97.3°F | Resp 12

## 2012-06-10 DIAGNOSIS — Z5112 Encounter for antineoplastic immunotherapy: Secondary | ICD-10-CM

## 2012-06-10 DIAGNOSIS — C9000 Multiple myeloma not having achieved remission: Secondary | ICD-10-CM

## 2012-06-10 LAB — CBC WITH DIFFERENTIAL/PLATELET
BASO%: 0.4 % (ref 0.0–2.0)
EOS%: 2.2 % (ref 0.0–7.0)
LYMPH%: 26.9 % (ref 14.0–49.7)
MCHC: 35 g/dL (ref 31.5–36.0)
MCV: 89 fL (ref 79.5–101.0)
MONO#: 0.6 10*3/uL (ref 0.1–0.9)
MONO%: 8 % (ref 0.0–14.0)
Platelets: 128 10*3/uL — ABNORMAL LOW (ref 145–400)
RBC: 4.53 10*6/uL (ref 3.70–5.45)
WBC: 6.9 10*3/uL (ref 3.9–10.3)
nRBC: 0 % (ref 0–0)

## 2012-06-10 MED ORDER — BORTEZOMIB CHEMO SQ INJECTION 3.5 MG (2.5MG/ML)
1.3000 mg/m2 | Freq: Once | INTRAMUSCULAR | Status: AC
  Administered 2012-06-10: 2 mg via SUBCUTANEOUS
  Filled 2012-06-10: qty 2

## 2012-06-10 MED ORDER — ONDANSETRON HCL 8 MG PO TABS
8.0000 mg | ORAL_TABLET | Freq: Once | ORAL | Status: AC
  Administered 2012-06-10: 8 mg via ORAL

## 2012-06-10 NOTE — Patient Instructions (Addendum)
 [facility] Discharge Instructions for Patients Receiving Chemotherapy  Today you received the following chemotherapy agents velcade SQ  To help prevent nausea and vomiting after your treatment, we encourage you to take your nausea medication if needed Begin taking it at 730pm and take it as often as prescribed for the next 48 hours if needed.   If you develop nausea and vomiting that is not controlled by your nausea medication, call the clinic. If it is after clinic hours your family physician or the after hours number for the clinic or go to the Emergency Department.   BELOW ARE SYMPTOMS THAT SHOULD BE REPORTED IMMEDIATELY:  *FEVER GREATER THAN 100.5 F  *CHILLS WITH OR WITHOUT FEVER  NAUSEA AND VOMITING THAT IS NOT CONTROLLED WITH YOUR NAUSEA MEDICATION  *UNUSUAL SHORTNESS OF BREATH  *UNUSUAL BRUISING OR BLEEDING  TENDERNESS IN MOUTH AND THROAT WITH OR WITHOUT PRESENCE OF ULCERS  *URINARY PROBLEMS  *BOWEL PROBLEMS  UNUSUAL RASH Items with * indicate a potential emergency and should be followed up as soon as possible.  One of the nurses will contact you 24 hours after your treatment. Please let the nurse know about any problems that you may have experienced. Feel free to call the clinic you have any questions or concerns. The clinic phone number is [phone].   I have been informed and understand all the instructions given to me. I know to contact the clinic, my physician, or go to the Emergency Department if any problems should occur. I do not have any questions at this time, but understand that I may call the clinic during office hours   should I have any questions or need assistance in obtaining follow up care.    __________________________________________  _____________  __________ Signature of Patient or Authorized Representative            Date                   Time    __________________________________________ Nurse's Signature

## 2012-06-24 ENCOUNTER — Ambulatory Visit (HOSPITAL_BASED_OUTPATIENT_CLINIC_OR_DEPARTMENT_OTHER): Payer: Medicare Other

## 2012-06-24 ENCOUNTER — Encounter: Payer: Self-pay | Admitting: Oncology

## 2012-06-24 ENCOUNTER — Ambulatory Visit (HOSPITAL_BASED_OUTPATIENT_CLINIC_OR_DEPARTMENT_OTHER): Payer: Medicare Other | Admitting: Oncology

## 2012-06-24 ENCOUNTER — Other Ambulatory Visit (HOSPITAL_BASED_OUTPATIENT_CLINIC_OR_DEPARTMENT_OTHER): Payer: Medicare Other | Admitting: Lab

## 2012-06-24 ENCOUNTER — Telehealth: Payer: Self-pay | Admitting: Oncology

## 2012-06-24 VITALS — BP 160/103 | HR 72 | Temp 97.6°F | Resp 20 | Ht 64.0 in | Wt 116.7 lb

## 2012-06-24 DIAGNOSIS — C9000 Multiple myeloma not having achieved remission: Secondary | ICD-10-CM

## 2012-06-24 DIAGNOSIS — Z5112 Encounter for antineoplastic immunotherapy: Secondary | ICD-10-CM

## 2012-06-24 DIAGNOSIS — F329 Major depressive disorder, single episode, unspecified: Secondary | ICD-10-CM

## 2012-06-24 DIAGNOSIS — I10 Essential (primary) hypertension: Secondary | ICD-10-CM

## 2012-06-24 DIAGNOSIS — R52 Pain, unspecified: Secondary | ICD-10-CM

## 2012-06-24 LAB — CBC WITH DIFFERENTIAL/PLATELET
Eosinophils Absolute: 0.2 10*3/uL (ref 0.0–0.5)
HCT: 42.7 % (ref 34.8–46.6)
LYMPH%: 25.7 % (ref 14.0–49.7)
MONO#: 0.4 10*3/uL (ref 0.1–0.9)
NEUT#: 4.1 10*3/uL (ref 1.5–6.5)
NEUT%: 64.5 % (ref 38.4–76.8)
Platelets: 166 10*3/uL (ref 145–400)
WBC: 6.4 10*3/uL (ref 3.9–10.3)

## 2012-06-24 MED ORDER — ONDANSETRON HCL 8 MG PO TABS
8.0000 mg | ORAL_TABLET | Freq: Once | ORAL | Status: AC
  Administered 2012-06-24: 8 mg via ORAL

## 2012-06-24 MED ORDER — NYSTATIN-TRIAMCINOLONE 100000-0.1 UNIT/GM-% EX OINT: TOPICAL_OINTMENT | Freq: Two times a day (BID) | CUTANEOUS | Status: DC

## 2012-06-24 MED ORDER — BORTEZOMIB CHEMO SQ INJECTION 3.5 MG (2.5MG/ML)
1.3000 mg/m2 | Freq: Once | INTRAMUSCULAR | Status: AC
  Administered 2012-06-24: 2 mg via SUBCUTANEOUS
  Filled 2012-06-24: qty 2

## 2012-06-24 NOTE — Progress Notes (Signed)
[facility]  Telephone:(336) [phone] Fax:(336) [phone]   OFFICE PROGRESS NOTE   Cc:  [name], MD   PAST DIAGNOSIS: multiple myeloma presented with bony disease and slight increase in her light chain. Initial diagnosis was in September 2007. She had presented initially with a plasmacytoma. Subsequently, she had developed multiple myeloma.   PAST THERAPY:  1. 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 She has received total of 45 Gy in 25 fractions in March 2008.  2. She received induction therapy with Revlimid and dexamethasone. Subsequently, received Velcade and dexamethasone until September 2009.  3. She received high dose chemotherapy and stem cell transplant in September 2009.  4. She had a complete response. She has been on active surveillance since that time.  5. 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 She received salvage chemo Velcade/Cytoxan/Dexamethasone on 05/05/11 with very good partial response. 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   CURRENT THERAPY: She started on maintenance therapy with SQ Velcade weekly, 3 weeks on, 1 week off on Apr 01, 2012.   INTERVAL HISTORY: Kendra Johns 61 y.o. female returns for regular follow up by herself.  Reports that her back pain has worsened over the past 2 weeks. She remains on MS Contin and oxymorphone as needed for pain. She states she's been taking up to 9 and oxymorphone per day. Pain is primarily in her lower back. She is not having any neurological changes and is able to walk. She states that she feels like her pain is similar to the pain she had when she was diagnosed with multiple myeloma. Her other concern today is of little spots bumping up near her groin. These little bones have been itchy and they, and go. She does not think they're related to her Velcade. She has been on  maintenance SQ Velcade without problem.  She denied severe fatigue, fever, mucositis, neuropathy, bleeding, skin rash, lower extremity weakness, bowel/bladder incontinence.   Past Medical History  Diagnosis Date  . Depression   . Hypertension   . Hepatitis C     genotype 1  . Diverticulosis of colon   . Multiple myeloma     kappa light chain, s/p high-dose chemo/stem cell tx - [facility]  . Hx of adenomatous colonic polyps 2007  . Anxiety   . Osteoarthritis   . Sleep apnea   . Hyperlipidemia   . GERD with stricture   . External hemorrhoids   . Hepatitis B virus infection   . IBS (irritable bowel syndrome)   . Helicobacter pylori gastritis 2001    ? if treated  . Sciatic nerve pain     with Dr. Delma Officer  . Chronic low back pain     Past Surgical History  Procedure Date  . Abdominal hysterectomy   . Oophorectomy   . Dilation and curettage of uterus   . Bone marrow transplant   . Appendectomy   . Colonoscopy w/ polypectomy 08/2006    1-2 adenomas, 6 polyps total, diverticulosis and external hemorrhoids  . Upper gastrointestinal endoscopy 08/2003    esophageal stricture dilation, hiatal hernia, gastrritis  . Cholecystectomy   . Tonsillectomy and adenoidectomy     Current Outpatient Prescriptions  Medication Sig Dispense Refill  . acyclovir (ZOVIRAX) 400 MG tablet Take 400 mg by mouth 2 (two) times daily.        Marland Kitchen  Bisacodyl (LAXATIVE PO) Take by mouth. As needed       . diazepam (VALIUM) 5 MG tablet Take 1 tablet (5 mg total) by mouth as needed.  20 tablet  3  . DULoxetine (CYMBALTA) 30 MG capsule Take 30 mg by mouth daily.      . Magnesium Hydroxide (MILK OF MAGNESIA PO) Take by mouth daily as needed.        Marland Kitchen morphine (MS CONTIN) 60 MG 12 hr tablet Take 60 mg by mouth every 8 (eight) hours.       Marland Kitchen nystatin-triamcinolone ointment (MYCOLOG) Apply topically 2 (two) times daily.  60 g  1  . oxymorphone (OPANA) 10 MG tablet Take 10 mg by mouth every 6 (six) hours as needed.  1-2 tabs      . polyethylene glycol (MIRALAX / GLYCOLAX) packet Take 17 g by mouth every other day.        . prochlorperazine (COMPAZINE) 10 MG tablet Take 10 mg by mouth every 6 (six) hours as needed.        . senna-docusate (SENOKOT-S) 8.6-50 MG per tablet Take 2 tablets by mouth daily.      . Sodium Phosphates (ENEMA RE) Place rectally. As needed       . DISCONTD: amLODipine (NORVASC) 5 MG tablet Take 1 tablet (5 mg total) by mouth daily.  90 tablet  3  . DISCONTD: gabapentin (NEURONTIN) 600 MG tablet Take 600 mg by mouth 3 (three) times daily.        Marland Kitchen DISCONTD: lisinopril (PRINIVIL,ZESTRIL) 20 MG tablet Take 1 tablet (20 mg total) by mouth daily.  90 tablet  11   No current facility-administered medications for this visit.   Facility-Administered Medications Ordered in Other Visits  Medication Dose Route Frequency Provider Last Rate Last Dose  . bortezomib SQ (VELCADE) chemo injection 2 mg  1.3 mg/m2 (Treatment Plan Actual) Subcutaneous Once Exie Parody, MD   2 mg at 06/24/12 1332  . ondansetron (ZOFRAN) tablet 8 mg  8 mg Oral Once Exie Parody, MD   8 mg at 06/24/12 1308    ALLERGIES:  is allergic to codeine and ibuprofen.  REVIEW OF SYSTEMS:  The rest of the 14-point review of system was negative.   Filed Vitals:   06/24/12 1200  BP: 160/103  Pulse: 72  Temp: 97.6 F (36.4 C)  Resp: 20   Wt Readings from Last 3 Encounters:  06/24/12 116 lb 11.2 oz (52.935 kg)  05/27/12 114 lb 11.2 oz (52.028 kg)  04/29/12 113 lb 3.2 oz (51.347 kg)   ECOG Performance status: 1-2 due to chronic hip pain.  PHYSICAL EXAMINATION:   General: thin-appearing woman, in no acute distress. Eyes: no scleral icterus. ENT: There were no oropharyngeal lesions. Neck was without thyromegaly. Lymphatics: Negative cervical, supraclavicular or axillary adenopathy. Respiratory: lungs were clear bilaterally without wheezing or crackles. Cardiovascular: Regular rate and rhythm, S1/S2, without murmur, rub or gallop.  There was no pedal edema. GI: abdomen was soft, flat, nontender, nondistended, without organomegaly. Muscoloskeletal: no spinal tenderness of palpation of vertebral spine. She was able to get on and off the exam table by herself. There was no pain upon lying down.Skin exam was without echymosis, petichae. Neuro exam was nonfocal. Patient was able to get on and off exam table without assistance. Gait was normal. Patient was alerted and oriented. Attention was good. Language was appropriate. Mood was normal without depression. Speech was not pressured. Thought content was not tangential.  LABORATORY/RADIOLOGY DATA:  Lab Results  Component Value Date   WBC 6.4 06/24/2012   HGB 14.4 06/24/2012   HCT 42.7 06/24/2012   PLT 166 06/24/2012   GLUCOSE 96 06/24/2012   CHOL 240* 02/03/2011   TRIG 93.0 02/03/2011   HDL 43.00 02/03/2011   LDLDIRECT 176.9 02/03/2011   LDLCALC  Value: 112        Total Cholesterol/HDL:CHD Risk Coronary Heart Disease Risk Table                     Men   Women  1/2 Average Risk   3.4   3.3* 11/26/2007   ALKPHOS 64 06/24/2012   ALT 29 06/24/2012   AST 33 06/24/2012   NA 138 06/24/2012   K 4.1 06/24/2012   CL 106 06/24/2012   CREATININE 0.66 06/24/2012   BUN 11 06/24/2012   CO2 24 06/24/2012   INR 1.0 03/12/2008   HGBA1C  Value: 5.8 (NOTE)   The ADA recommends the following therapeutic goals for glycemic   control related to Hgb A1C measurement:   Goal of Therapy:   < 7.0% Hgb A1C   Action Suggested:  > 8.0% Hgb A1C   Ref:  Diabetes Care, 22, Suppl. 1, 1999 11/26/2007    ASSESSMENT AND PLAN:   1. Multiple myeloma: She is s/p a 2nd auto BMT. She has been on maintenance SQ Velcade since 03/2012 without problem or dose limiting toxicity.  I recommended continuing with Velcade at current dose for at least until 01/2014.  2. History of cirrhosis and hepatitis C: Diagnosed in 2009 with [facility] when she underwent the 1st transplant evaluation. She is compensated without encephalopathy, ascites,  bleeding, cytopenia. She does not drink EtOH.  3. Prophylaxis: she is on acyclovir 400 mg p.o. b.i.d. to decrease her risk of herpes simplex virus reactivation while on Velcade.  4. Pain: Either multiple myeloma versus the DJD versus sciatic nerve. Pain has been worse over the past 2 weeks. She remains on  MS Contin and breakthrough pain with Opana by Dr. [name]. I appreciate his assistance. Past work up did not show evidence of cord compression. Reviewed with Dr Gaylyn Rong today. Recommend proceeding with a bone marrow biopsy to evaluate for multiple myeloma. Recommendation discussed with the patient today. If she has proceed with a bone marrow biopsy she preferred to have this done by Dr Gaylyn Rong instead of interventional radiology. The patient tells that she wishes to wait another week or two to see if her pain is going to get worse or improve. She was instructed to call us next week if her pain is not any better. 5. Hypertension: now normotensive due to recent weight loss from BMT. She is off of BP med for now.  6. History of anorexia, weight loss: her weight is increasing now.  7. Depression:  Mood was stable today on Cymbalta per PCP.  8. Follow up; Weekly lab and Velcade SQ (weekly, 3 weeks on, 1 week off). Return visit in about 1 month.  In the future, if she tolerates Velcade well, we may consider seeing her every 2 months.     The length of time of the face-to-face encounter was 30 minutes. More than 50% of time was spent counseling and coordination of care.

## 2012-06-24 NOTE — Patient Instructions (Signed)
[facility] Discharge Instructions for Patients Receiving Chemotherapy  Today you received the following chemotherapy agents:  velcade SQ  To help prevent nausea and vomiting after your treatment, we encourage you to take your nausea medicationIf you develop nausea and vomiting that is not controlled by your nausea medication, call the clinic. If it is after clinic hours your family physician or the after hours number for the clinic or go to the Emergency Department.   BELOW ARE SYMPTOMS THAT SHOULD BE REPORTED IMMEDIATELY:  *FEVER GREATER THAN 100.5 F  *CHILLS WITH OR WITHOUT FEVER  NAUSEA AND VOMITING THAT IS NOT CONTROLLED WITH YOUR NAUSEA MEDICATION  *UNUSUAL SHORTNESS OF BREATH  *UNUSUAL BRUISING OR BLEEDING  TENDERNESS IN MOUTH AND THROAT WITH OR WITHOUT PRESENCE OF ULCERS  *URINARY PROBLEMS  *BOWEL PROBLEMS  UNUSUAL RASH Items with * indicate a potential emergency and should be followed up as soon as possible.   Please let the nurse know about any problems that you may have experienced. Feel free to call the clinic you have any questions or concerns. The clinic phone number is [phone].   I have been informed and understand all the instructions given to me. I know to contact the clinic, my physician, or go to the Emergency Department if any problems should occur. I do not have any questions at this time, but understand that I may call the clinic during office hours   should I have any questions or need assistance in obtaining follow up care.    __________________________________________  _____________  __________ Signature of Patient or Authorized Representative            Date                   Time    __________________________________________ Nurse's Signature

## 2012-06-24 NOTE — Telephone Encounter (Signed)
Gave appt calendar for August 2013 lab and chemo, September 2013 lab, MD and chemo

## 2012-06-27 LAB — COMPREHENSIVE METABOLIC PANEL
CO2: 24 mEq/L (ref 19–32)
Creatinine, Ser: 0.66 mg/dL (ref 0.50–1.10)
Glucose, Bld: 96 mg/dL (ref 70–99)
Total Bilirubin: 0.5 mg/dL (ref 0.3–1.2)

## 2012-06-27 LAB — KAPPA/LAMBDA LIGHT CHAINS
Kappa free light chain: 1.42 mg/dL (ref 0.33–1.94)
Lambda Free Lght Chn: 0.75 mg/dL (ref 0.57–2.63)

## 2012-07-01 ENCOUNTER — Other Ambulatory Visit (HOSPITAL_BASED_OUTPATIENT_CLINIC_OR_DEPARTMENT_OTHER): Payer: Medicare Other | Admitting: Lab

## 2012-07-01 ENCOUNTER — Ambulatory Visit (HOSPITAL_BASED_OUTPATIENT_CLINIC_OR_DEPARTMENT_OTHER): Payer: Medicare Other

## 2012-07-01 VITALS — BP 127/93 | HR 78 | Temp 98.4°F | Resp 20

## 2012-07-01 DIAGNOSIS — C9000 Multiple myeloma not having achieved remission: Secondary | ICD-10-CM

## 2012-07-01 DIAGNOSIS — Z5112 Encounter for antineoplastic immunotherapy: Secondary | ICD-10-CM

## 2012-07-01 LAB — CBC WITH DIFFERENTIAL/PLATELET
BASO%: 0.5 % (ref 0.0–2.0)
Eosinophils Absolute: 0.2 10*3/uL (ref 0.0–0.5)
MCHC: 34.8 g/dL (ref 31.5–36.0)
MONO#: 0.6 10*3/uL (ref 0.1–0.9)
NEUT#: 3.2 10*3/uL (ref 1.5–6.5)
RBC: 4.56 10*6/uL (ref 3.70–5.45)
RDW: 15.5 % — ABNORMAL HIGH (ref 11.2–14.5)
WBC: 6.1 10*3/uL (ref 3.9–10.3)
lymph#: 2.2 10*3/uL (ref 0.9–3.3)
nRBC: 0 % (ref 0–0)

## 2012-07-01 MED ORDER — BORTEZOMIB CHEMO SQ INJECTION 3.5 MG (2.5MG/ML)
1.3000 mg/m2 | Freq: Once | INTRAMUSCULAR | Status: AC
  Administered 2012-07-01: 2 mg via SUBCUTANEOUS
  Filled 2012-07-01: qty 2

## 2012-07-01 MED ORDER — ONDANSETRON HCL 8 MG PO TABS
8.0000 mg | ORAL_TABLET | Freq: Once | ORAL | Status: AC
  Administered 2012-07-01: 8 mg via ORAL

## 2012-07-01 NOTE — Patient Instructions (Signed)
[facility] Discharge Instructions for Patients Receiving Chemotherapy  Today you received the following chemotherapy agents Velcade  To help prevent nausea and vomiting after your treatment, we encourage you to take your nausea medication as directed.   If you develop nausea and vomiting that is not controlled by your nausea medication, call the clinic. If it is after clinic hours your family physician or the after hours number for the clinic or go to the Emergency Department.   BELOW ARE SYMPTOMS THAT SHOULD BE REPORTED IMMEDIATELY:  *FEVER GREATER THAN 100.5 F  *CHILLS WITH OR WITHOUT FEVER  NAUSEA AND VOMITING THAT IS NOT CONTROLLED WITH YOUR NAUSEA MEDICATION  *UNUSUAL SHORTNESS OF BREATH  *UNUSUAL BRUISING OR BLEEDING  TENDERNESS IN MOUTH AND THROAT WITH OR WITHOUT PRESENCE OF ULCERS  *URINARY PROBLEMS  *BOWEL PROBLEMS  UNUSUAL RASH Items with * indicate a potential emergency and should be followed up as soon as possible.  One of the nurses will contact you 24 hours after your treatment. Please let the nurse know about any problems that you may have experienced. Feel free to call the clinic you have any questions or concerns. The clinic phone number is [phone].   I have been informed and understand all the instructions given to me. I know to contact the clinic, my physician, or go to the Emergency Department if any problems should occur. I do not have any questions at this time, but understand that I may call the clinic during office hours   should I have any questions or need assistance in obtaining follow up care.    __________________________________________  _____________  __________ Signature of Patient or Authorized Representative            Date                   Time    __________________________________________ Nurse's Signature    

## 2012-07-08 ENCOUNTER — Ambulatory Visit (HOSPITAL_BASED_OUTPATIENT_CLINIC_OR_DEPARTMENT_OTHER): Payer: Medicare Other

## 2012-07-08 ENCOUNTER — Other Ambulatory Visit (HOSPITAL_BASED_OUTPATIENT_CLINIC_OR_DEPARTMENT_OTHER): Payer: Medicare Other | Admitting: Lab

## 2012-07-08 VITALS — BP 127/87 | HR 70 | Temp 97.6°F | Resp 17

## 2012-07-08 DIAGNOSIS — C9000 Multiple myeloma not having achieved remission: Secondary | ICD-10-CM

## 2012-07-08 DIAGNOSIS — Z5112 Encounter for antineoplastic immunotherapy: Secondary | ICD-10-CM

## 2012-07-08 LAB — CBC WITH DIFFERENTIAL/PLATELET
Basophils Absolute: 0.1 10*3/uL (ref 0.0–0.1)
Eosinophils Absolute: 0.1 10*3/uL (ref 0.0–0.5)
HCT: 43 % (ref 34.8–46.6)
HGB: 14.9 g/dL (ref 11.6–15.9)
MCH: 31 pg (ref 25.1–34.0)
MONO#: 0.6 10*3/uL (ref 0.1–0.9)
NEUT#: 5.5 10*3/uL (ref 1.5–6.5)
NEUT%: 66.7 % (ref 38.4–76.8)
RDW: 15.3 % — ABNORMAL HIGH (ref 11.2–14.5)
WBC: 8.2 10*3/uL (ref 3.9–10.3)
lymph#: 2 10*3/uL (ref 0.9–3.3)

## 2012-07-08 MED ORDER — ONDANSETRON HCL 8 MG PO TABS
8.0000 mg | ORAL_TABLET | Freq: Once | ORAL | Status: AC
  Administered 2012-07-08: 8 mg via ORAL

## 2012-07-08 MED ORDER — BORTEZOMIB CHEMO SQ INJECTION 3.5 MG (2.5MG/ML)
1.3000 mg/m2 | Freq: Once | INTRAMUSCULAR | Status: AC
  Administered 2012-07-08: 2 mg via SUBCUTANEOUS
  Filled 2012-07-08: qty 2

## 2012-07-08 NOTE — Patient Instructions (Addendum)
BORTEZOMIB (bor TEZ oh mib) is a chemotherapy drug. It slows the growth of cancer cells. This medicine is used to treat multiple myeloma, lymphoma, and other cancers. This medicine may be used for other purposes; ask your health care provider or pharmacist if you have questions. What should I tell my health care provider before I take this medicine? They need to know if you have any of these conditions: -heart disease -irregular heartbeat -liver disease -low blood counts, like low white blood cells, platelets, or hemoglobin -peripheral neuropathy -taking medicine for blood pressure -an unusual or allergic reaction to bortezomib, mannitol, boron, other medicines, foods, dyes, or preservatives -pregnant or trying to get pregnant -breast-feeding How should I use this medicine? This medicine is for injection into a vein or for injection under the skin. It is given by a health care professional in a hospital or clinic setting. Talk to your pediatrician regarding the use of this medicine in children. Special care may be needed. Overdosage: If you think you have taken too much of this medicine contact a poison control center or emergency room at once. NOTE: This medicine is only for you. Do not share this medicine with others. What if I miss a dose? It is important not to miss your dose. Call your doctor or health care professional if you are unable to keep an appointment. What may interact with this medicine? -medicines for diabetes -medicines to increase blood counts like filgrastim, pegfilgrastim, sargramostim -zalcitabine Talk to your doctor or health care professional before taking any of these medicines: -acetaminophen -aspirin -ibuprofen -ketoprofen -naproxen This list may not describe all possible interactions. Give your health care provider a list of all the medicines, herbs, non-prescription drugs, or dietary supplements you use. Also tell them if you smoke, drink alcohol, or use  illegal drugs. Some items may interact with your medicine. What should I watch for while using this medicine? Visit your doctor for checks on your progress. This drug may make you feel generally unwell. This is not uncommon, as chemotherapy can affect healthy cells as well as cancer cells. Report any side effects. Continue your course of treatment even though you feel ill unless your doctor tells you to stop. You may get drowsy or dizzy. Do not drive, use machinery, or do anything that needs mental alertness until you know how this medicine affects you. Do not stand or sit up quickly, especially if you are an older patient. This reduces the risk of dizzy or fainting spells. In some cases, you may be given additional medicines to help with side effects. Follow all directions for their use. Call your doctor or health care professional for advice if you get a fever, chills or sore throat, or other symptoms of a cold or flu. Do not treat yourself. This drug decreases your body's ability to fight infections. Try to avoid being around people who are sick. This medicine may increase your risk to bruise or bleed. Call your doctor or health care professional if you notice any unusual bleeding. Be careful brushing and flossing your teeth or using a toothpick because you may get an infection or bleed more easily. If you have any dental work done, tell your dentist you are receiving this medicine. Avoid taking products that contain aspirin, acetaminophen, ibuprofen, naproxen, or ketoprofen unless instructed by your doctor. These medicines may hide a fever. Do not become pregnant while taking this medicine. Women should inform their doctor if they wish to become pregnant or think they might   be pregnant. There is a potential for serious side effects to an unborn child. Talk to your health care professional or pharmacist for more information. Do not breast-feed an infant while taking this medicine. You may have vomiting  or diarrhea while taking this medicine. Drink water or other fluids as directed. What side effects may I notice from receiving this medicine? Side effects that you should report to your doctor or health care professional as soon as possible: -allergic reactions like skin rash, itching or hives, swelling of the face, lips, or tongue -breathing problems -changes in hearing -changes in vision -fast, irregular heartbeat -feeling faint or lightheaded, falls -pain, tingling, numbness in the hands or feet -seizures -swelling of the ankles, feet, hands -unusual bleeding or bruising -unusually weak or tired -vomiting Side effects that usually do not require medical attention (report to your doctor or health care professional if they continue or are bothersome): -changes in emotions or moods -constipation -diarrhea -loss of appetite -headache -irritation at site where injected -nausea This list may not describe all possible side effects. Call your doctor for medical advice about side effects. You may report side effects to FDA at 1-800-FDA-1088. Where should I keep my medicine? This drug is given in a hospital or clinic and will not be stored at home. NOTE: This sheet is a summary. It may not cover all possible information. If you have questions about this medicine, talk to your doctor, pharmacist, or health care provider.  2012, [employer]. (11/26/2010 11:42:36 AM) 

## 2012-07-22 ENCOUNTER — Ambulatory Visit (HOSPITAL_BASED_OUTPATIENT_CLINIC_OR_DEPARTMENT_OTHER): Payer: Medicare Other | Admitting: Oncology

## 2012-07-22 ENCOUNTER — Other Ambulatory Visit (HOSPITAL_BASED_OUTPATIENT_CLINIC_OR_DEPARTMENT_OTHER): Payer: Medicare Other | Admitting: Lab

## 2012-07-22 ENCOUNTER — Ambulatory Visit (HOSPITAL_BASED_OUTPATIENT_CLINIC_OR_DEPARTMENT_OTHER): Payer: Medicare Other

## 2012-07-22 VITALS — BP 140/107 | HR 88 | Temp 97.8°F | Resp 20 | Ht 64.0 in | Wt 116.3 lb

## 2012-07-22 DIAGNOSIS — C9000 Multiple myeloma not having achieved remission: Secondary | ICD-10-CM

## 2012-07-22 DIAGNOSIS — K7460 Unspecified cirrhosis of liver: Secondary | ICD-10-CM

## 2012-07-22 DIAGNOSIS — M8448XA Pathological fracture, other site, initial encounter for fracture: Secondary | ICD-10-CM

## 2012-07-22 DIAGNOSIS — B1920 Unspecified viral hepatitis C without hepatic coma: Secondary | ICD-10-CM

## 2012-07-22 DIAGNOSIS — Z5112 Encounter for antineoplastic immunotherapy: Secondary | ICD-10-CM

## 2012-07-22 LAB — CBC WITH DIFFERENTIAL/PLATELET
Basophils Absolute: 0 10*3/uL (ref 0.0–0.1)
Eosinophils Absolute: 0.2 10*3/uL (ref 0.0–0.5)
HCT: 43.9 % (ref 34.8–46.6)
HGB: 15 g/dL (ref 11.6–15.9)
LYMPH%: 30.9 % (ref 14.0–49.7)
MCV: 91.6 fL (ref 79.5–101.0)
MONO#: 0.5 10*3/uL (ref 0.1–0.9)
MONO%: 7.9 % (ref 0.0–14.0)
NEUT#: 3.4 10*3/uL (ref 1.5–6.5)
Platelets: 155 10*3/uL (ref 145–400)
RBC: 4.79 10*6/uL (ref 3.70–5.45)
WBC: 5.8 10*3/uL (ref 3.9–10.3)

## 2012-07-22 MED ORDER — ONDANSETRON HCL 8 MG PO TABS
8.0000 mg | ORAL_TABLET | Freq: Once | ORAL | Status: AC
  Administered 2012-07-22: 8 mg via ORAL

## 2012-07-22 MED ORDER — BORTEZOMIB CHEMO SQ INJECTION 3.5 MG (2.5MG/ML)
1.3000 mg/m2 | Freq: Once | INTRAMUSCULAR | Status: AC
  Administered 2012-07-22: 2 mg via SUBCUTANEOUS
  Filled 2012-07-22: qty 2

## 2012-07-22 NOTE — Patient Instructions (Addendum)
1. Diagnosis: Recurrent multiple myeloma. 2. Current treatment: Maintenance Velcade injection. 3. Issue: If your bone pain significantly worsens we may need to repeat bone marrow biopsy to ensure no recurrent multiple myeloma. However, blood test does not show sign of recurrence at this time. 4. Follow up:  In one month with lab and next cycle of chemo.  Lab/return visit with Belenda Cruise in about 2 months.

## 2012-07-22 NOTE — Progress Notes (Signed)
[facility]  [phone] [phone]   OFFICE PROGRESS NOTE   Cc:  [name], MD  PAST DIAGNOSIS: multiple myeloma presented with bony disease and slight increase in her light chain. Initial diagnosis was in September 2007. She had presented initially with a plasmacytoma. Subsequently, she had developed multiple myeloma.   PAST THERAPY:  1. 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 She has received total of 45 Gy in 25 fractions in March 2008.  2. She received induction therapy with Revlimid and dexamethasone. Subsequently, received Velcade and dexamethasone until September 2009.  3. She received high dose chemotherapy and stem cell transplant in September 2009.  4. She had a complete response. She has been on active surveillance since that time.  5. 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 She received salvage chemo Velcade/Cytoxan/Dexamethasone on 05/05/11 with very good partial response. 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   CURRENT THERAPY: she started maintenance therapy with SQ Velcade weekly, 3 weeks on, 1 week off on Apr 01, 2012.   INTERVAL HISTORY: Kendra Johns 61 y.o. female returns for regular follow up by herself. She reports feeling stable. She has chronic low back pain and this has been stable compared to a month ago. Last time she was here she had acute exacerbation of her low back pain. This increase in her pain level has now come back to baseline. She thinks that the acute exacerbation of back pain last month was due to the wet weather and sitting or an a.c. vent at a [employer].  Her pain level is well controlled MS Contin 60 mg by mouth twice a day and Opana for breakthrough pain a few times a day. She has mild fatigue, however she is independent of light activities daily living and personal  hygiene activities. She denies fever, mucositis, neuropathy, skin rash from chemotherapy Velcade.  Patient denies fever, anorexia, weight loss, headache, visual changes, confusion, drenching night sweats, palpable lymph node swelling, mucositis, odynophagia, dysphagia, nausea vomiting, jaundice, chest pain, palpitation, shortness of breath, dyspnea on exertion, productive cough, gum bleeding, epistaxis, hematemesis, hemoptysis, abdominal pain, abdominal swelling, early satiety, melena, hematochezia, hematuria, skin rash, spontaneous bleeding, joint swelling, joint pain, heat or cold intolerance, bowel bladder incontinence,focal motor weakness, paresthesia, depression.    Past Medical History  Diagnosis Date  . Depression   . Hypertension   . Hepatitis C     genotype 1  . Diverticulosis of colon   . Multiple myeloma     kappa light chain, s/p high-dose chemo/stem cell tx - [facility]  . Hx of adenomatous colonic polyps 2007  . Anxiety   . Osteoarthritis   . Sleep apnea   . Hyperlipidemia   . GERD with stricture   . External hemorrhoids   . Hepatitis B virus infection   . IBS (irritable bowel syndrome)   . Helicobacter pylori gastritis 2001    ? if treated  . Sciatic nerve pain     with Dr. Delma Officer  . Chronic low back pain     Past Surgical History  Procedure Date  . Abdominal hysterectomy   . Oophorectomy   . Dilation and curettage of uterus   . Bone marrow transplant   . Appendectomy   . Colonoscopy w/ polypectomy 08/2006    1-2 adenomas, 6 polyps total, diverticulosis and external hemorrhoids  .  Upper gastrointestinal endoscopy 08/2003    esophageal stricture dilation, hiatal hernia, gastrritis  . Cholecystectomy   . Tonsillectomy and adenoidectomy     Current Outpatient Prescriptions  Medication Sig Dispense Refill  . acyclovir (ZOVIRAX) 400 MG tablet Take 400 mg by mouth 2 (two) times daily.        . Bisacodyl (LAXATIVE PO) Take by mouth. As needed       . diazepam  (VALIUM) 5 MG tablet Take 1 tablet (5 mg total) by mouth as needed.  20 tablet  3  . DULoxetine (CYMBALTA) 30 MG capsule Take 30 mg by mouth daily.      . Magnesium Hydroxide (MILK OF MAGNESIA PO) Take by mouth daily as needed.        Marland Kitchen morphine (MS CONTIN) 60 MG 12 hr tablet Take 60 mg by mouth every 8 (eight) hours.       Marland Kitchen nystatin-triamcinolone ointment (MYCOLOG) Apply topically 2 (two) times daily.  60 g  1  . oxymorphone (OPANA) 10 MG tablet Take 10 mg by mouth every 6 (six) hours as needed. 1-2 tabs      . polyethylene glycol (MIRALAX / GLYCOLAX) packet Take 17 g by mouth every other day.        . prochlorperazine (COMPAZINE) 10 MG tablet Take 10 mg by mouth every 6 (six) hours as needed.        . senna-docusate (SENOKOT-S) 8.6-50 MG per tablet Take 2 tablets by mouth daily.      . Sodium Phosphates (ENEMA RE) Place rectally. As needed       . DISCONTD: amLODipine (NORVASC) 5 MG tablet Take 1 tablet (5 mg total) by mouth daily.  90 tablet  3  . DISCONTD: gabapentin (NEURONTIN) 600 MG tablet Take 600 mg by mouth 3 (three) times daily.        Marland Kitchen DISCONTD: lisinopril (PRINIVIL,ZESTRIL) 20 MG tablet Take 1 tablet (20 mg total) by mouth daily.  90 tablet  11   No current facility-administered medications for this visit.   Facility-Administered Medications Ordered in Other Visits  Medication Dose Route Frequency Provider Last Rate Last Dose  . bortezomib SQ (VELCADE) chemo injection 2 mg  1.3 mg/m2 (Treatment Plan Actual) Subcutaneous Once Exie Parody, MD   2 mg at 07/22/12 1228  . ondansetron (ZOFRAN) tablet 8 mg  8 mg Oral Once Exie Parody, MD   8 mg at 07/22/12 1159    ALLERGIES:  is allergic to codeine and ibuprofen.  REVIEW OF SYSTEMS:  The rest of the 14-point review of system was negative.   Filed Vitals:   07/22/12 1055  BP: 140/107  Pulse: 88  Temp: 97.8 F (36.6 C)  Resp: 20   Wt Readings from Last 3 Encounters:  07/22/12 116 lb 4.8 oz (52.753 kg)  06/24/12 116 lb 11.2 oz  (52.935 kg)  05/27/12 114 lb 11.2 oz (52.028 kg)   ECOG Performance status: 1-2 due to chronic hip pain.   PHYSICAL EXAMINATION:  General: thin-appearing woman, in no acute distress. Eyes: no scleral icterus. ENT: There were no oropharyngeal lesions. Neck was without thyromegaly. Lymphatics: Negative cervical, supraclavicular or axillary adenopathy. Respiratory: lungs were clear bilaterally without wheezing or crackles. Cardiovascular: Regular rate and rhythm, S1/S2, without murmur, rub or gallop. There was no pedal edema. GI: abdomen was soft, flat, nontender, nondistended, without organomegaly. Muscoloskeletal: no spinal tenderness of palpation of vertebral spine. She was able to get on and off the exam table by  herself. There was no pain upon lying down.Skin exam was without echymosis, petichae. Neuro exam was nonfocal. Patient was able to get on and off exam table without assistance. Gait was normal. Patient was alerted and oriented. Attention was good. Language was appropriate. Mood was normal without depression. Speech was not pressured. Thought content was not tangential.    LABORATORY/RADIOLOGY DATA:  Lab Results  Component Value Date   WBC 5.8 07/22/2012   HGB 15.0 07/22/2012   HCT 43.9 07/22/2012   PLT 155 07/22/2012   GLUCOSE 96 06/24/2012   CHOL 240* 02/03/2011   TRIG 93.0 02/03/2011   HDL 43.00 02/03/2011   LDLDIRECT 176.9 02/03/2011   LDLCALC  Value: 112        Total Cholesterol/HDL:CHD Risk Coronary Heart Disease Risk Table                     Men   Women  1/2 Average Risk   3.4   3.3* 11/26/2007   ALKPHOS 64 06/24/2012   ALT 29 06/24/2012   AST 33 06/24/2012   NA 138 06/24/2012   K 4.1 06/24/2012   CL 106 06/24/2012   CREATININE 0.66 06/24/2012   BUN 11 06/24/2012   CO2 24 06/24/2012   INR 1.0 03/12/2008   HGBA1C  Value: 5.8 (NOTE)   The ADA recommends the following therapeutic goals for glycemic   control related to Hgb A1C measurement:   Goal of Therapy:   < 7.0% Hgb A1C   Action  Suggested:  > 8.0% Hgb A1C   Ref:  Diabetes Care, 22, Suppl. 1, 1999 11/26/2007    ASSESSMENT AND PLAN:   1. Multiple myeloma: She is s/p a 2nd auto BMT. She has been on maintenance SQ Velcade since 03/2012 without problem or dose limiting toxicity. I recommended continuing with Velcade at current dose for at least until 01/2014.  2. History of cirrhosis and hepatitis C: Diagnosed in 2009 with [facility] when she underwent the 1st transplant evaluation. She is compensated without encephalopathy, ascites, bleeding, cytopenia. She does not drink EtOH.  3. Prophylaxis: she is on acyclovir 400 mg p.o. b.i.d. to decrease her risk of herpes simplex virus reactivation while on Velcade.  4. Lower back pain:  Due to DJD and compression fractures.  Not candidate for further surgery.  Her pain is now back to baseline with MS Contin and Opana.  In the past, when she had myeloma relapse, she had severe pain exas.  As her pain comes back to baseline and normal SPEP and light chain, we will continue to observe for now.  5. Hypertension: I advised her to follow up with her PCP for BP meds as indicated.    6. Depression: Mood was stable today on Cymbalta per PCP.  7. Follow up; Weekly lab and Velcade SQ (weekly, 3 weeks on, 1 week off). Return visit in about 2 months with Belenda Cruise, NP and with me in 4 months.

## 2012-07-22 NOTE — Patient Instructions (Signed)
Patient aware of next appointment; discharged home with no complaints. 

## 2012-07-25 ENCOUNTER — Other Ambulatory Visit: Payer: Self-pay | Admitting: Oncology

## 2012-07-27 ENCOUNTER — Telehealth: Payer: Self-pay | Admitting: Oncology

## 2012-07-27 ENCOUNTER — Telehealth: Payer: Self-pay | Admitting: *Deleted

## 2012-07-27 NOTE — Telephone Encounter (Signed)
lmonvm adviisng the pt of her sept appts and to pick up her calendar at that time

## 2012-07-27 NOTE — Telephone Encounter (Signed)
Per staff message and POF I have scheduled appts.  JMW  

## 2012-07-29 ENCOUNTER — Other Ambulatory Visit (HOSPITAL_BASED_OUTPATIENT_CLINIC_OR_DEPARTMENT_OTHER): Payer: Medicare Other | Admitting: Lab

## 2012-07-29 ENCOUNTER — Ambulatory Visit (HOSPITAL_BASED_OUTPATIENT_CLINIC_OR_DEPARTMENT_OTHER): Payer: Medicare Other

## 2012-07-29 DIAGNOSIS — C9000 Multiple myeloma not having achieved remission: Secondary | ICD-10-CM

## 2012-07-29 DIAGNOSIS — Z5112 Encounter for antineoplastic immunotherapy: Secondary | ICD-10-CM

## 2012-07-29 LAB — CBC WITH DIFFERENTIAL/PLATELET
BASO%: 0.5 % (ref 0.0–2.0)
Basophils Absolute: 0 10*3/uL (ref 0.0–0.1)
EOS%: 3.4 % (ref 0.0–7.0)
HGB: 14.8 g/dL (ref 11.6–15.9)
MCH: 31.5 pg (ref 25.1–34.0)
MCHC: 34.7 g/dL (ref 31.5–36.0)
MCV: 90.6 fL (ref 79.5–101.0)
MONO%: 9.2 % (ref 0.0–14.0)
RBC: 4.7 10*6/uL (ref 3.70–5.45)
RDW: 15 % — ABNORMAL HIGH (ref 11.2–14.5)
lymph#: 1.7 10*3/uL (ref 0.9–3.3)
nRBC: 0 % (ref 0–0)

## 2012-07-29 MED ORDER — BORTEZOMIB CHEMO SQ INJECTION 3.5 MG (2.5MG/ML)
1.3000 mg/m2 | Freq: Once | INTRAMUSCULAR | Status: AC
  Administered 2012-07-29: 2 mg via SUBCUTANEOUS
  Filled 2012-07-29: qty 2

## 2012-07-29 MED ORDER — ONDANSETRON HCL 8 MG PO TABS
8.0000 mg | ORAL_TABLET | Freq: Once | ORAL | Status: AC
  Administered 2012-07-29: 8 mg via ORAL

## 2012-07-29 NOTE — Patient Instructions (Signed)
[facility] Discharge Instructions for Patients Receiving Chemotherapy  Today you received the following chemotherapy agents Velcade  To help prevent nausea and vomiting after your treatment, we encourage you to take your nausea medication as directed.   If you develop nausea and vomiting that is not controlled by your nausea medication, call the clinic. If it is after clinic hours your family physician or the after hours number for the clinic or go to the Emergency Department.   BELOW ARE SYMPTOMS THAT SHOULD BE REPORTED IMMEDIATELY:  *FEVER GREATER THAN 100.5 F  *CHILLS WITH OR WITHOUT FEVER  NAUSEA AND VOMITING THAT IS NOT CONTROLLED WITH YOUR NAUSEA MEDICATION  *UNUSUAL SHORTNESS OF BREATH  *UNUSUAL BRUISING OR BLEEDING  TENDERNESS IN MOUTH AND THROAT WITH OR WITHOUT PRESENCE OF ULCERS  *URINARY PROBLEMS  *BOWEL PROBLEMS  UNUSUAL RASH Items with * indicate a potential emergency and should be followed up as soon as possible.  One of the nurses will contact you 24 hours after your treatment. Please let the nurse know about any problems that you may have experienced. Feel free to call the clinic you have any questions or concerns. The clinic phone number is [phone].   I have been informed and understand all the instructions given to me. I know to contact the clinic, my physician, or go to the Emergency Department if any problems should occur. I do not have any questions at this time, but understand that I may call the clinic during office hours   should I have any questions or need assistance in obtaining follow up care.    __________________________________________  _____________  __________ Signature of Patient or Authorized Representative            Date                   Time    __________________________________________ Nurse's Signature    

## 2012-08-05 ENCOUNTER — Ambulatory Visit (HOSPITAL_BASED_OUTPATIENT_CLINIC_OR_DEPARTMENT_OTHER): Payer: Medicare Other

## 2012-08-05 ENCOUNTER — Other Ambulatory Visit (HOSPITAL_BASED_OUTPATIENT_CLINIC_OR_DEPARTMENT_OTHER): Payer: Medicare Other | Admitting: Lab

## 2012-08-05 VITALS — BP 161/89 | HR 77 | Temp 98.1°F | Resp 20

## 2012-08-05 DIAGNOSIS — C9000 Multiple myeloma not having achieved remission: Secondary | ICD-10-CM

## 2012-08-05 DIAGNOSIS — Z5112 Encounter for antineoplastic immunotherapy: Secondary | ICD-10-CM

## 2012-08-05 LAB — CBC WITH DIFFERENTIAL/PLATELET
BASO%: 0.6 % (ref 0.0–2.0)
Eosinophils Absolute: 0.3 10*3/uL (ref 0.0–0.5)
LYMPH%: 36.6 % (ref 14.0–49.7)
MCHC: 34.6 g/dL (ref 31.5–36.0)
MONO#: 0.5 10*3/uL (ref 0.1–0.9)
NEUT#: 3.3 10*3/uL (ref 1.5–6.5)
RBC: 4.83 10*6/uL (ref 3.70–5.45)
RDW: 15.1 % — ABNORMAL HIGH (ref 11.2–14.5)
WBC: 6.5 10*3/uL (ref 3.9–10.3)
lymph#: 2.4 10*3/uL (ref 0.9–3.3)
nRBC: 0 % (ref 0–0)

## 2012-08-05 MED ORDER — BORTEZOMIB CHEMO SQ INJECTION 3.5 MG (2.5MG/ML)
1.3000 mg/m2 | Freq: Once | INTRAMUSCULAR | Status: AC
  Administered 2012-08-05: 2 mg via SUBCUTANEOUS
  Filled 2012-08-05: qty 2

## 2012-08-05 MED ORDER — ONDANSETRON HCL 8 MG PO TABS
8.0000 mg | ORAL_TABLET | Freq: Once | ORAL | Status: AC
  Administered 2012-08-05: 8 mg via ORAL

## 2012-08-05 NOTE — Patient Instructions (Signed)
[facility] Discharge Instructions for Patients Receiving Chemotherapy  Today you received the following chemotherapy agents Velcade.  To help prevent nausea and vomiting after your treatment, we encourage you to take your nausea medication as prescribed.   If you develop nausea and vomiting that is not controlled by your nausea medication, call the clinic. If it is after clinic hours your family physician or the after hours number for the clinic or go to the Emergency Department.   BELOW ARE SYMPTOMS THAT SHOULD BE REPORTED IMMEDIATELY:  *FEVER GREATER THAN 100.5 F  *CHILLS WITH OR WITHOUT FEVER  NAUSEA AND VOMITING THAT IS NOT CONTROLLED WITH YOUR NAUSEA MEDICATION  *UNUSUAL SHORTNESS OF BREATH  *UNUSUAL BRUISING OR BLEEDING  TENDERNESS IN MOUTH AND THROAT WITH OR WITHOUT PRESENCE OF ULCERS  *URINARY PROBLEMS  *BOWEL PROBLEMS  UNUSUAL RASH Items with * indicate a potential emergency and should be followed up as soon   Feel free to call the clinic you have any questions or concerns. The clinic phone number is [phone].

## 2012-08-08 ENCOUNTER — Other Ambulatory Visit: Payer: Self-pay | Admitting: Certified Registered Nurse Anesthetist

## 2012-08-19 ENCOUNTER — Other Ambulatory Visit (HOSPITAL_BASED_OUTPATIENT_CLINIC_OR_DEPARTMENT_OTHER): Payer: Medicare Other | Admitting: Lab

## 2012-08-19 ENCOUNTER — Ambulatory Visit (HOSPITAL_BASED_OUTPATIENT_CLINIC_OR_DEPARTMENT_OTHER): Payer: Medicare Other

## 2012-08-19 VITALS — BP 136/94 | HR 73 | Temp 98.1°F | Resp 20

## 2012-08-19 DIAGNOSIS — C9000 Multiple myeloma not having achieved remission: Secondary | ICD-10-CM

## 2012-08-19 DIAGNOSIS — Z5112 Encounter for antineoplastic immunotherapy: Secondary | ICD-10-CM

## 2012-08-19 LAB — COMPREHENSIVE METABOLIC PANEL (CC13)
ALT: 34 U/L (ref 0–55)
CO2: 24 mEq/L (ref 22–29)
Calcium: 9.7 mg/dL (ref 8.4–10.4)
Chloride: 106 mEq/L (ref 98–107)
Glucose: 84 mg/dl (ref 70–99)
Sodium: 140 mEq/L (ref 136–145)
Total Protein: 6.7 g/dL (ref 6.4–8.3)

## 2012-08-19 LAB — CBC WITH DIFFERENTIAL/PLATELET
Basophils Absolute: 0.1 10*3/uL (ref 0.0–0.1)
Eosinophils Absolute: 0.2 10*3/uL (ref 0.0–0.5)
HGB: 14.6 g/dL (ref 11.6–15.9)
LYMPH%: 33.7 % (ref 14.0–49.7)
MCV: 92.4 fL (ref 79.5–101.0)
MONO#: 0.4 10*3/uL (ref 0.1–0.9)
MONO%: 7.4 % (ref 0.0–14.0)
NEUT#: 3.2 10*3/uL (ref 1.5–6.5)
Platelets: 166 10*3/uL (ref 145–400)
RBC: 4.61 10*6/uL (ref 3.70–5.45)
WBC: 6 10*3/uL (ref 3.9–10.3)
nRBC: 0 % (ref 0–0)

## 2012-08-19 MED ORDER — ONDANSETRON HCL 8 MG PO TABS
8.0000 mg | ORAL_TABLET | Freq: Once | ORAL | Status: AC
  Administered 2012-08-19: 8 mg via ORAL

## 2012-08-19 MED ORDER — BORTEZOMIB CHEMO SQ INJECTION 3.5 MG (2.5MG/ML)
1.3000 mg/m2 | Freq: Once | INTRAMUSCULAR | Status: AC
  Administered 2012-08-19: 2 mg via SUBCUTANEOUS
  Filled 2012-08-19: qty 2

## 2012-08-19 NOTE — Patient Instructions (Signed)
[facility] Discharge [facility] for Patients Receiving Chemotherapy  Today you received the following chemotherapy agents: velcade  To help prevent nausea and vomiting after your treatment, we encourage you to take your nausea medication.  Take it as often as prescribed.     If you develop nausea and vomiting that is not controlled by your nausea medication, call the clinic. If it is after clinic hours your family physician or the after hours number for the clinic or go to the Emergency Department.   BELOW ARE SYMPTOMS THAT SHOULD BE REPORTED IMMEDIATELY:  *FEVER GREATER THAN 100.5 F  *CHILLS WITH OR WITHOUT FEVER  NAUSEA AND VOMITING THAT IS NOT CONTROLLED WITH YOUR NAUSEA MEDICATION  *UNUSUAL SHORTNESS OF BREATH  *UNUSUAL BRUISING OR BLEEDING  TENDERNESS IN MOUTH AND THROAT WITH OR WITHOUT PRESENCE OF ULCERS  *URINARY PROBLEMS  *BOWEL PROBLEMS  UNUSUAL RASH Items with * indicate a potential emergency and should be followed up as soon as possible.  Feel free to call the clinic you have any questions or concerns. The clinic phone number is [phone].   I have been informed and understand all the instructions given to me. I know to contact the clinic, my physician, or go to the Emergency Department if any problems should occur. I do not have any questions at this time, but understand that I may call the clinic during office hours   should I have any questions or need assistance in obtaining follow up care.    __________________________________________  _____________  __________ Signature of Patient or Authorized Representative            Date                   Time    __________________________________________ Nurse's Signature    

## 2012-08-23 LAB — SPEP & IFE WITH QIG
Albumin ELP: 60.5 % (ref 55.8–66.1)
Alpha-1-Globulin: 4.8 % (ref 2.9–4.9)
Alpha-2-Globulin: 13.2 % — ABNORMAL HIGH (ref 7.1–11.8)
Beta 2: 2.6 % — ABNORMAL LOW (ref 3.2–6.5)
Beta Globulin: 5.6 % (ref 4.7–7.2)
Total Protein, Serum Electrophoresis: 6.8 g/dL (ref 6.0–8.3)

## 2012-08-23 LAB — KAPPA/LAMBDA LIGHT CHAINS
Kappa free light chain: 2.07 mg/dL — ABNORMAL HIGH (ref 0.33–1.94)
Lambda Free Lght Chn: 0.87 mg/dL (ref 0.57–2.63)

## 2012-08-26 ENCOUNTER — Ambulatory Visit (HOSPITAL_BASED_OUTPATIENT_CLINIC_OR_DEPARTMENT_OTHER): Payer: Medicare Other

## 2012-08-26 ENCOUNTER — Other Ambulatory Visit (HOSPITAL_BASED_OUTPATIENT_CLINIC_OR_DEPARTMENT_OTHER): Payer: Medicare Other | Admitting: Lab

## 2012-08-26 VITALS — BP 145/99 | HR 79 | Temp 98.4°F

## 2012-08-26 DIAGNOSIS — C9000 Multiple myeloma not having achieved remission: Secondary | ICD-10-CM

## 2012-08-26 DIAGNOSIS — Z5112 Encounter for antineoplastic immunotherapy: Secondary | ICD-10-CM

## 2012-08-26 LAB — CBC WITH DIFFERENTIAL/PLATELET
BASO%: 0.5 % (ref 0.0–2.0)
EOS%: 4.2 % (ref 0.0–7.0)
LYMPH%: 40.4 % (ref 14.0–49.7)
MCH: 31.7 pg (ref 25.1–34.0)
MCHC: 34.4 g/dL (ref 31.5–36.0)
MCV: 92.1 fL (ref 79.5–101.0)
MONO%: 10.6 % (ref 0.0–14.0)
Platelets: 136 10*3/uL — ABNORMAL LOW (ref 145–400)
RBC: 4.7 10*6/uL (ref 3.70–5.45)
WBC: 6.1 10*3/uL (ref 3.9–10.3)
nRBC: 0 % (ref 0–0)

## 2012-08-26 MED ORDER — BORTEZOMIB CHEMO SQ INJECTION 3.5 MG (2.5MG/ML)
1.3000 mg/m2 | Freq: Once | INTRAMUSCULAR | Status: AC
  Administered 2012-08-26: 2 mg via SUBCUTANEOUS
  Filled 2012-08-26: qty 2

## 2012-08-26 MED ORDER — ONDANSETRON HCL 8 MG PO TABS
8.0000 mg | ORAL_TABLET | Freq: Once | ORAL | Status: AC
  Administered 2012-08-26: 8 mg via ORAL

## 2012-08-26 NOTE — Progress Notes (Signed)
Patient declined AVS 

## 2012-08-26 NOTE — Patient Instructions (Signed)
[facility] Discharge Instructions for Patients Receiving Chemotherapy  Today you received the following chemotherapy agents velcade.  To help prevent nausea and vomiting after your treatment, we encourage you to take your nausea medication compazine Begin taking it tonight and take it as often as prescribed.   If you develop nausea and vomiting that is not controlled by your nausea medication, call the clinic. If it is after clinic hours your family physician or the after hours number for the clinic or go to the Emergency Department.   BELOW ARE SYMPTOMS THAT SHOULD BE REPORTED IMMEDIATELY:  *FEVER GREATER THAN 100.5 F  *CHILLS WITH OR WITHOUT FEVER  NAUSEA AND VOMITING THAT IS NOT CONTROLLED WITH YOUR NAUSEA MEDICATION  *UNUSUAL SHORTNESS OF BREATH  *UNUSUAL BRUISING OR BLEEDING  TENDERNESS IN MOUTH AND THROAT WITH OR WITHOUT PRESENCE OF ULCERS  *URINARY PROBLEMS  *BOWEL PROBLEMS  UNUSUAL RASH Items with * indicate a potential emergency and should be followed up as soon as possible.  One of the nurses will contact you 24 hours after your treatment. Please let the nurse know about any problems that you may have experienced. Feel free to call the clinic you have any questions or concerns. The clinic phone number is [phone].   I have been informed and understand all the instructions given to me. I know to contact the clinic, my physician, or go to the Emergency Department if any problems should occur. I do not have any questions at this time, but understand that I may call the clinic during office hours   should I have any questions or need assistance in obtaining follow up care.    __________________________________________  _____________  __________ Signature of Patient or Authorized Representative            Date                   Time    __________________________________________ Nurse's Signature

## 2012-08-30 ENCOUNTER — Ambulatory Visit (INDEPENDENT_AMBULATORY_CARE_PROVIDER_SITE_OTHER): Payer: Medicare Other

## 2012-08-30 DIAGNOSIS — Z23 Encounter for immunization: Secondary | ICD-10-CM

## 2012-08-31 DIAGNOSIS — Z23 Encounter for immunization: Secondary | ICD-10-CM

## 2012-09-02 ENCOUNTER — Ambulatory Visit (HOSPITAL_BASED_OUTPATIENT_CLINIC_OR_DEPARTMENT_OTHER): Payer: Medicare Other

## 2012-09-02 ENCOUNTER — Other Ambulatory Visit (HOSPITAL_BASED_OUTPATIENT_CLINIC_OR_DEPARTMENT_OTHER): Payer: Medicare Other

## 2012-09-02 VITALS — BP 144/89 | HR 73 | Temp 97.9°F | Resp 20

## 2012-09-02 DIAGNOSIS — Z5112 Encounter for antineoplastic immunotherapy: Secondary | ICD-10-CM

## 2012-09-02 DIAGNOSIS — C9000 Multiple myeloma not having achieved remission: Secondary | ICD-10-CM

## 2012-09-02 LAB — CBC WITH DIFFERENTIAL/PLATELET
BASO%: 0.7 % (ref 0.0–2.0)
EOS%: 3.9 % (ref 0.0–7.0)
MCH: 31.6 pg (ref 25.1–34.0)
MCHC: 34.3 g/dL (ref 31.5–36.0)
MONO%: 9.3 % (ref 0.0–14.0)
RDW: 14.3 % (ref 11.2–14.5)
lymph#: 2.3 10*3/uL (ref 0.9–3.3)

## 2012-09-02 MED ORDER — BORTEZOMIB CHEMO SQ INJECTION 3.5 MG (2.5MG/ML)
1.3000 mg/m2 | Freq: Once | INTRAMUSCULAR | Status: AC
  Administered 2012-09-02: 2 mg via SUBCUTANEOUS
  Filled 2012-09-02: qty 2

## 2012-09-02 MED ORDER — ONDANSETRON HCL 8 MG PO TABS
8.0000 mg | ORAL_TABLET | Freq: Once | ORAL | Status: AC
  Administered 2012-09-02: 8 mg via ORAL

## 2012-09-02 NOTE — Patient Instructions (Signed)
Kendra Johns 1951-03-12 [id number]  [facility] Discharge Instructions for Patients Receiving Chemotherapy  Today you received the following chemotherapy agents: Velcade   To help prevent nausea and vomiting after your treatment, we encourage you to take your nausea medication as prescribed; if you develop nausea and vomiting that is not controlled by your nausea medication, call the clinic.   BELOW ARE SYMPTOMS THAT SHOULD BE REPORTED IMMEDIATELY:  *FEVER GREATER THAN 100.5 F *CHILLS WITH OR WITHOUT FEVER *UNUSUAL SHORTNESS OF BREATH *UNUSUAL BRUISING OR BLEEDING *URINARY PROBLEMS *BOWEL PROBLEMS  I have been informed and understand all the instructions given to me.   Please call the [facility] at [phone] during business hours should you have any further questions or need assistance in obtaining follow-up care. If you have a medical emergency, please dial [phone].

## 2012-09-16 ENCOUNTER — Ambulatory Visit (HOSPITAL_BASED_OUTPATIENT_CLINIC_OR_DEPARTMENT_OTHER): Payer: Medicare Other

## 2012-09-16 ENCOUNTER — Other Ambulatory Visit (HOSPITAL_BASED_OUTPATIENT_CLINIC_OR_DEPARTMENT_OTHER): Payer: Medicare Other | Admitting: Lab

## 2012-09-16 ENCOUNTER — Encounter: Payer: Self-pay | Admitting: Oncology

## 2012-09-16 ENCOUNTER — Ambulatory Visit (HOSPITAL_BASED_OUTPATIENT_CLINIC_OR_DEPARTMENT_OTHER): Payer: Medicare Other | Admitting: Oncology

## 2012-09-16 VITALS — BP 136/96 | HR 89 | Temp 99.1°F | Resp 20 | Ht 64.0 in | Wt 121.0 lb

## 2012-09-16 DIAGNOSIS — Z5112 Encounter for antineoplastic immunotherapy: Secondary | ICD-10-CM

## 2012-09-16 DIAGNOSIS — C9000 Multiple myeloma not having achieved remission: Secondary | ICD-10-CM

## 2012-09-16 LAB — CBC WITH DIFFERENTIAL/PLATELET
Basophils Absolute: 0 10*3/uL (ref 0.0–0.1)
Eosinophils Absolute: 0.1 10*3/uL (ref 0.0–0.5)
HGB: 14.4 g/dL (ref 11.6–15.9)
MCV: 95.1 fL (ref 79.5–101.0)
MONO#: 0.4 10*3/uL (ref 0.1–0.9)
MONO%: 7.7 % (ref 0.0–14.0)
NEUT#: 3.3 10*3/uL (ref 1.5–6.5)
RDW: 14.1 % (ref 11.2–14.5)
WBC: 5.2 10*3/uL (ref 3.9–10.3)
lymph#: 1.3 10*3/uL (ref 0.9–3.3)

## 2012-09-16 LAB — COMPREHENSIVE METABOLIC PANEL (CC13)
Albumin: 3.8 g/dL (ref 3.5–5.0)
BUN: 13 mg/dL (ref 7.0–26.0)
CO2: 26 mEq/L (ref 22–29)
Calcium: 9.7 mg/dL (ref 8.4–10.4)
Chloride: 105 mEq/L (ref 98–107)
Glucose: 117 mg/dl — ABNORMAL HIGH (ref 70–99)
Potassium: 4 mEq/L (ref 3.5–5.1)
Sodium: 137 mEq/L (ref 136–145)
Total Protein: 6.9 g/dL (ref 6.4–8.3)

## 2012-09-16 MED ORDER — ONDANSETRON HCL 8 MG PO TABS
8.0000 mg | ORAL_TABLET | Freq: Once | ORAL | Status: AC
  Administered 2012-09-16: 8 mg via ORAL

## 2012-09-16 MED ORDER — BORTEZOMIB CHEMO SQ INJECTION 3.5 MG (2.5MG/ML)
1.3000 mg/m2 | Freq: Once | INTRAMUSCULAR | Status: AC
  Administered 2012-09-16: 2 mg via SUBCUTANEOUS
  Filled 2012-09-16: qty 0.8

## 2012-09-16 NOTE — Progress Notes (Signed)
[facility]  Telephone:([phone]) [phone] [phone]   OFFICE PROGRESS NOTE   Cc:  [name], MD  PAST DIAGNOSIS: multiple myeloma presented with bony disease and slight increase in her light chain. Initial diagnosis was in September 2007. She had presented initially with a plasmacytoma. Subsequently, she had developed multiple myeloma.   PAST THERAPY:  1. 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 She has received total of 45 Gy in 25 fractions in March 2008.  2. She received induction therapy with Revlimid and dexamethasone. Subsequently, received Velcade and dexamethasone until September 2009.  3. She received high dose chemotherapy and stem cell transplant in September 2009.  4. She had a complete response. She has been on active surveillance since that time.  5. 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 She received salvage chemo Velcade/Cytoxan/Dexamethasone on 05/05/11 with very good partial response. 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   CURRENT THERAPY: she started maintenance therapy with SQ Velcade weekly, 3 weeks on, 1 week off on Apr 01, 2012.   INTERVAL HISTORY: Kendra Johns 61 y.o. female returns for regular follow up by herself. She reports feeling stable. She has chronic low back pain and this has been stable. Her pain level is well controlled MS Contin 60 mg by mouth twice a day and MSIR for breakthrough pain a few times a day. She has mild fatigue, however she is independent of light activities daily living and personal hygiene activities. She denies fever, mucositis, neuropathy, skin rash from chemotherapy Velcade.  Patient denies fever, anorexia, weight loss, headache, visual changes, confusion, drenching night sweats, palpable lymph node swelling, mucositis, odynophagia, dysphagia, nausea vomiting, jaundice,  chest pain, palpitation, shortness of breath, dyspnea on exertion, productive cough, gum bleeding, epistaxis, hematemesis, hemoptysis, abdominal pain, abdominal swelling, early satiety, melena, hematochezia, hematuria, skin rash, spontaneous bleeding, joint swelling, joint pain, heat or cold intolerance, bowel bladder incontinence,focal motor weakness, paresthesia, depression.    Past Medical History  Diagnosis Date  . Depression   . Hypertension   . Hepatitis C     genotype 1  . Diverticulosis of colon   . Multiple myeloma     kappa light chain, s/p high-dose chemo/stem cell tx - [facility]  . Hx of adenomatous colonic polyps 2007  . Anxiety   . Osteoarthritis   . Sleep apnea   . Hyperlipidemia   . GERD with stricture   . External hemorrhoids   . Hepatitis B virus infection   . IBS (irritable bowel syndrome)   . Helicobacter pylori gastritis 2001    ? if treated  . Sciatic nerve pain     with Dr. Delma Officer  . Chronic low back pain     Past Surgical History  Procedure Date  . Abdominal hysterectomy   . Oophorectomy   . Dilation and curettage of uterus   . Bone marrow transplant   . Appendectomy   . Colonoscopy w/ polypectomy 08/2006    1-2 adenomas, 6 polyps total, diverticulosis and external hemorrhoids  . Upper gastrointestinal endoscopy 08/2003    esophageal stricture dilation, hiatal hernia, gastrritis  . Cholecystectomy   . Tonsillectomy and adenoidectomy     Current Outpatient Prescriptions  Medication Sig Dispense Refill  . acyclovir (ZOVIRAX) 400 MG tablet Take 800 mg by mouth 2 (two) times daily.       . Bisacodyl (  LAXATIVE PO) Take by mouth. As needed      . diazepam (VALIUM) 5 MG tablet Take 1 tablet (5 mg total) by mouth as needed.  20 tablet  3  . levofloxacin (LEVAQUIN) 500 MG tablet Take 1 tablet (500 mg total) by mouth daily.  5 tablet  0  . Magnesium Hydroxide (MILK OF MAGNESIA PO) Take by mouth daily as needed.        Marland Kitchen morphine (MS CONTIN) 60 MG 12 hr  tablet Take 60 mg by mouth every 8 (eight) hours.       Marland Kitchen morphine (MSIR) 15 MG tablet Take 15 mg by mouth every 4 (four) hours as needed. pain      . polyethylene glycol (MIRALAX / GLYCOLAX) packet Take 17 g by mouth every other day.        . predniSONE (STERAPRED UNI-PAK) 10 MG tablet Take 1 tablet (10 mg total) by mouth daily. Take 30 mg tablet today 11/19, take 20 mg tablet 11/20, take 10 mg tablet 11/21 and than stop  6 tablet  0  . prochlorperazine (COMPAZINE) 10 MG tablet Take 10 mg by mouth every 6 (six) hours as needed. nausea      . [DISCONTINUED] amLODipine (NORVASC) 5 MG tablet Take 1 tablet (5 mg total) by mouth daily.  90 tablet  3  . [DISCONTINUED] gabapentin (NEURONTIN) 600 MG tablet Take 600 mg by mouth 3 (three) times daily.        . [DISCONTINUED] lisinopril (PRINIVIL,ZESTRIL) 20 MG tablet Take 1 tablet (20 mg total) by mouth daily.  90 tablet  11   No current facility-administered medications for this visit.   Facility-Administered Medications Ordered in Other Visits  Medication Dose Route Frequency Provider Last Rate Last Dose  . [COMPLETED] acetaminophen (TYLENOL) 325 MG tablet        650 mg at 09/19/12 1836  . [COMPLETED] levofloxacin (LEVAQUIN) tablet 500 mg  500 mg Oral Once Hilario Quarry, MD   500 mg at 09/19/12 1521  . [DISCONTINUED] 0.9 %  sodium chloride infusion  250 mL Intravenous PRN Standley Brooking, MD      . [DISCONTINUED] acetaminophen (TYLENOL) tablet 650 mg  650 mg Oral Q6H PRN Standley Brooking, MD      . [DISCONTINUED] acyclovir (ZOVIRAX) tablet 800 mg  800 mg Oral BID Standley Brooking, MD   800 mg at 09/20/12 0931  . [DISCONTINUED] albuterol (PROVENTIL HFA;VENTOLIN HFA) 108 (90 BASE) MCG/ACT inhaler 2 puff  2 puff Inhalation Q4H PRN Hilario Quarry, MD      . [DISCONTINUED] albuterol (PROVENTIL) (5 MG/ML) 0.5% nebulizer solution 2.5 mg  2.5 mg Nebulization Q4H Hilario Quarry, MD   2.5 mg at 09/19/12 1618  . [DISCONTINUED] alum & mag hydroxide-simeth  (MAALOX/MYLANTA) 200-200-20 MG/5ML suspension 30 mL  30 mL Oral Q6H PRN Standley Brooking, MD      . [DISCONTINUED] bisacodyl (DULCOLAX) EC tablet 5 mg  5 mg Oral Daily PRN Standley Brooking, MD      . [DISCONTINUED] enoxaparin (LOVENOX) injection 40 mg  40 mg Subcutaneous Q24H Standley Brooking, MD   40 mg at 09/19/12 2100  . [DISCONTINUED] levofloxacin (LEVAQUIN) IVPB 750 mg  750 mg Intravenous Q24H Standley Brooking, MD      . [DISCONTINUED] magnesium hydroxide (MILK OF MAGNESIA) suspension 30 mL  30 mL Oral Daily PRN Standley Brooking, MD      . [DISCONTINUED] methylPREDNISolone sodium succinate (SOLU-MEDROL) 40 mg/mL  injection 40 mg  40 mg Intravenous Q6H Standley Brooking, MD      . [DISCONTINUED] methylPREDNISolone sodium succinate (SOLU-MEDROL) 40 mg/mL injection 40 mg  40 mg Intravenous Q6H Standley Brooking, MD      . [DISCONTINUED] methylPREDNISolone sodium succinate (SOLU-MEDROL) 40 mg/mL injection 40 mg  40 mg Intravenous Q6H Standley Brooking, MD   40 mg at 09/20/12 0500  . [DISCONTINUED] morphine (MS CONTIN) 12 hr tablet 60 mg  60 mg Oral Q8H Standley Brooking, MD   60 mg at 09/20/12 0500  . [DISCONTINUED] morphine (MSIR) tablet 15 mg  15 mg Oral Q4H PRN Standley Brooking, MD   15 mg at 09/20/12 0845  . [DISCONTINUED] ondansetron (ZOFRAN) injection 4 mg  4 mg Intravenous Q8H PRN Hilario Quarry, MD      . [DISCONTINUED] ondansetron [facility]) injection 4 mg  4 mg Intravenous Q6H PRN Standley Brooking, MD      . [DISCONTINUED] ondansetron [facility]) tablet 4 mg  4 mg Oral Q6H PRN Standley Brooking, MD      . [DISCONTINUED] polyethylene glycol (MIRALAX / GLYCOLAX) packet 17 g  17 g Oral QODAY Standley Brooking, MD   17 g at 09/20/12 0931  . [DISCONTINUED] prochlorperazine (COMPAZINE) tablet 10 mg  10 mg Oral Q6H PRN Standley Brooking, MD      . [DISCONTINUED] sodium chloride 0.9 % injection 3 mL  3 mL Intravenous Q12H Standley Brooking, MD   3 mL at 09/19/12 2126  . [DISCONTINUED] sodium  chloride 0.9 % injection 3 mL  3 mL Intravenous PRN Standley Brooking, MD        ALLERGIES:  is allergic to codeine and ibuprofen.  REVIEW OF SYSTEMS:  The rest of the 14-point review of system was negative.   Filed Vitals:   09/16/12 1200  BP: 136/96  Pulse: 89  Temp: 99.1 F (37.3 C)  Resp: 20   Wt Readings from Last 3 Encounters:  09/19/12 121 lb (54.885 kg)  09/19/12 118 lb (53.524 kg)  09/16/12 121 lb (54.885 kg)   ECOG Performance status: 1-2 due to chronic hip pain.   PHYSICAL EXAMINATION:  General: thin-appearing woman, in no acute distress. Eyes: no scleral icterus. ENT: There were no oropharyngeal lesions. Neck was without thyromegaly. Lymphatics: Negative cervical, supraclavicular or axillary adenopathy. Respiratory: lungs were clear bilaterally without wheezing or crackles. Cardiovascular: Regular rate and rhythm, S1/S2, without murmur, rub or gallop. There was no pedal edema. GI: abdomen was soft, flat, nontender, nondistended, without organomegaly. Muscoloskeletal: no spinal tenderness of palpation of vertebral spine. She was able to get on and off the exam table by herself. There was no pain upon lying down.Skin exam was without echymosis, petichae. Neuro exam was nonfocal. Patient was able to get on and off exam table without assistance. Gait was normal. Patient was alerted and oriented. Attention was good. Language was appropriate. Mood was normal without depression. Speech was not pressured. Thought content was not tangential.    LABORATORY/RADIOLOGY DATA:  Lab Results  Component Value Date   WBC 5.9 09/19/2012   HGB 15.0 09/19/2012   HCT 43.3 09/19/2012   PLT 127* 09/19/2012   GLUCOSE 95 09/19/2012   CHOL 240* 02/03/2011   TRIG 93.0 02/03/2011   HDL 43.00 02/03/2011   LDLDIRECT 176.9 02/03/2011   LDLCALC  Value: 112        Total Cholesterol/HDL:CHD Risk Coronary Heart Disease Risk Table  Men   Women  1/2 Average Risk   3.4   3.3* 11/26/2007    ALKPHOS 80 09/19/2012   ALT 51* 09/19/2012   AST 58* 09/19/2012   NA 138 09/19/2012   K 4.2 09/19/2012   CL 103 09/19/2012   CREATININE 0.70 09/19/2012   BUN 13 09/19/2012   CO2 27 09/19/2012   INR 1.0 03/12/2008   HGBA1C  Value: 5.8 (NOTE)   The ADA recommends the following therapeutic goals for glycemic   control related to Hgb A1C measurement:   Goal of Therapy:   < 7.0% Hgb A1C   Action Suggested:  > 8.0% Hgb A1C   Ref:  Diabetes Care, 22, Suppl. 1, 1999 11/26/2007    ASSESSMENT AND PLAN:   1. Multiple myeloma: She is s/p a 2nd auto BMT. She has been on maintenance SQ Velcade since 03/2012 without problem or dose limiting toxicity. I recommended continuing with Velcade at current dose for at least until 01/2014.  2. History of cirrhosis and hepatitis C: Diagnosed in 2009 with [facility] when she underwent the 1st transplant evaluation. She is compensated without encephalopathy, ascites, bleeding, cytopenia. She does not drink EtOH.  3. Prophylaxis: she is on acyclovir 400 mg p.o. b.i.d. to decrease her risk of herpes simplex virus reactivation while on Velcade.  4. Lower back pain:  Due to DJD and compression fractures.  Not candidate for further surgery.  Her pain is now back to baseline with MS Contin and Opana.  In the past, when she had myeloma relapse, she had severe pain exas.  As her pain comes back to baseline and normal SPEP and light chain, we will continue to observe for now.  5. Hypertension: I advised her to follow up with her PCP for BP meds as indicated.    6. Depression: Mood was stable today on Cymbalta per PCP.  7. Follow up; Weekly lab and Velcade SQ (weekly, 3 weeks on, 1 week off). Return visit in about 2 months.

## 2012-09-16 NOTE — Patient Instructions (Addendum)
Results for DARCI, LYKINS (MRN [id number]) as of 09/16/2012 11:46  Ref. Range 09/16/2012 11:36  WBC Latest Range: 4.5-10.5 K/uL 5.2  RBC Latest Range: 3.87-5.11 Mil/uL 4.42  Hemoglobin Latest Range: 12.0-15.0 g/dL 40.9  HCT Latest Range: 36.0-46.0 % 42.1  MCV Latest Range: 78.0-100.0 fl 95.1  MCH Latest Range: 25.1-34.0 pg 32.6  MCHC Latest Range: 30.0-36.0 g/dL 81.1  RDW Latest Range: 11.5-14.6 % 14.1  Platelets Latest Range: 150.0-400.0 K/uL 146  NEUT% Latest Range: 38.4-76.8 % 62.9  LYMPH% Latest Range: 14.0-49.7 % 25.8  MONO% Latest Range: 0.0-14.0 % 7.7  EOS% Latest Range: 0.0-7.0 % 2.8  BASO% Latest Range: 0.0-2.0 % 0.8  NEUT# Latest Range: 1.4-7.7 K/uL 3.3  MONO# Latest Range: 0.1-0.9 10e3/uL 0.4  Eosinophils Absolute Latest Range: 0.0-0.5 10e3/uL 0.1  Basophils Absolute Latest Range: 0.0-0.1 K/uL 0.0  lymph# Latest Range: 0.9-3.3 10e3/uL 1.3

## 2012-09-16 NOTE — Patient Instructions (Addendum)
[facility] Discharge Instructions for Patients Receiving Chemotherapy  Today you received the following chemotherapy agents:  Velcade  To help prevent nausea and vomiting after your treatment, we encourage you to take your nausea medication as ordered per MD.    If you develop nausea and vomiting that is not controlled by your nausea medication, call the clinic. If it is after clinic hours your family physician or the after hours number for the clinic or go to the Emergency Department.   BELOW ARE SYMPTOMS THAT SHOULD BE REPORTED IMMEDIATELY:  *FEVER GREATER THAN 100.5 F  *CHILLS WITH OR WITHOUT FEVER  NAUSEA AND VOMITING THAT IS NOT CONTROLLED WITH YOUR NAUSEA MEDICATION  *UNUSUAL SHORTNESS OF BREATH  *UNUSUAL BRUISING OR BLEEDING  TENDERNESS IN MOUTH AND THROAT WITH OR WITHOUT PRESENCE OF ULCERS  *URINARY PROBLEMS  *BOWEL PROBLEMS  UNUSUAL RASH Items with * indicate a potential emergency and should be followed up as soon as possible.   Please let the nurse know about any problems that you may have experienced. Feel free to call the clinic you have any questions or concerns. The clinic phone number is [phone].   I have been informed and understand all the instructions given to me. I know to contact the clinic, my physician, or go to the Emergency Department if any problems should occur. I do not have any questions at this time, but understand that I may call the clinic during office hours   should I have any questions or need assistance in obtaining follow up care.    __________________________________________  _____________  __________ Signature of Patient or Authorized Representative            Date                   Time    __________________________________________ Nurse's Signature    

## 2012-09-19 ENCOUNTER — Ambulatory Visit (INDEPENDENT_AMBULATORY_CARE_PROVIDER_SITE_OTHER): Payer: Medicare Other | Admitting: Family Medicine

## 2012-09-19 ENCOUNTER — Observation Stay (HOSPITAL_COMMUNITY)
Admission: EM | Admit: 2012-09-19 | Discharge: 2012-09-20 | DRG: 193 | Disposition: A | Discharge status: Home / Self Care | Payer: Medicare Other | Attending: Internal Medicine | Admitting: Internal Medicine

## 2012-09-19 ENCOUNTER — Encounter (HOSPITAL_COMMUNITY): Payer: Self-pay

## 2012-09-19 ENCOUNTER — Encounter: Payer: Self-pay | Admitting: Family Medicine

## 2012-09-19 ENCOUNTER — Telehealth: Payer: Self-pay | Admitting: Oncology

## 2012-09-19 ENCOUNTER — Emergency Department (HOSPITAL_COMMUNITY): Payer: Medicare Other

## 2012-09-19 ENCOUNTER — Other Ambulatory Visit: Payer: Self-pay

## 2012-09-19 VITALS — HR 111 | Temp 98.2°F | Wt 118.0 lb

## 2012-09-19 DIAGNOSIS — C9000 Multiple myeloma not having achieved remission: Secondary | ICD-10-CM | POA: Insufficient documentation

## 2012-09-19 DIAGNOSIS — M545 Low back pain, unspecified: Secondary | ICD-10-CM | POA: Insufficient documentation

## 2012-09-19 DIAGNOSIS — Z9481 Bone marrow transplant status: Secondary | ICD-10-CM | POA: Insufficient documentation

## 2012-09-19 DIAGNOSIS — C9001 Multiple myeloma in remission: Secondary | ICD-10-CM | POA: Diagnosis present

## 2012-09-19 DIAGNOSIS — G8929 Other chronic pain: Secondary | ICD-10-CM | POA: Insufficient documentation

## 2012-09-19 DIAGNOSIS — R0600 Dyspnea, unspecified: Secondary | ICD-10-CM

## 2012-09-19 DIAGNOSIS — B1920 Unspecified viral hepatitis C without hepatic coma: Secondary | ICD-10-CM | POA: Insufficient documentation

## 2012-09-19 DIAGNOSIS — I10 Essential (primary) hypertension: Secondary | ICD-10-CM | POA: Insufficient documentation

## 2012-09-19 DIAGNOSIS — R0989 Other specified symptoms and signs involving the circulatory and respiratory systems: Secondary | ICD-10-CM

## 2012-09-19 DIAGNOSIS — J9601 Acute respiratory failure with hypoxia: Secondary | ICD-10-CM

## 2012-09-19 DIAGNOSIS — R062 Wheezing: Secondary | ICD-10-CM

## 2012-09-19 DIAGNOSIS — J209 Acute bronchitis, unspecified: Secondary | ICD-10-CM | POA: Insufficient documentation

## 2012-09-19 DIAGNOSIS — E785 Hyperlipidemia, unspecified: Secondary | ICD-10-CM | POA: Insufficient documentation

## 2012-09-19 DIAGNOSIS — J449 Chronic obstructive pulmonary disease, unspecified: Secondary | ICD-10-CM

## 2012-09-19 DIAGNOSIS — F17200 Nicotine dependence, unspecified, uncomplicated: Secondary | ICD-10-CM | POA: Insufficient documentation

## 2012-09-19 DIAGNOSIS — R0902 Hypoxemia: Secondary | ICD-10-CM

## 2012-09-19 DIAGNOSIS — J9600 Acute respiratory failure, unspecified whether with hypoxia or hypercapnia: Principal | ICD-10-CM | POA: Insufficient documentation

## 2012-09-19 DIAGNOSIS — R630 Anorexia: Secondary | ICD-10-CM

## 2012-09-19 DIAGNOSIS — K7460 Unspecified cirrhosis of liver: Secondary | ICD-10-CM | POA: Insufficient documentation

## 2012-09-19 LAB — CBC WITH DIFFERENTIAL/PLATELET
Basophils Absolute: 0 10*3/uL (ref 0.0–0.1)
Eosinophils Absolute: 0.2 10*3/uL (ref 0.0–0.7)
Eosinophils Relative: 3 % (ref 0–5)
Lymphocytes Relative: 27 % (ref 12–46)
MCV: 91.9 fL (ref 78.0–100.0)
Platelets: 127 10*3/uL — ABNORMAL LOW (ref 150–400)
RDW: 13.6 % (ref 11.5–15.5)
WBC: 5.9 10*3/uL (ref 4.0–10.5)

## 2012-09-19 LAB — COMPREHENSIVE METABOLIC PANEL
ALT: 51 U/L — ABNORMAL HIGH (ref 0–35)
AST: 58 U/L — ABNORMAL HIGH (ref 0–37)
CO2: 27 mEq/L (ref 19–32)
Calcium: 9 mg/dL (ref 8.4–10.5)
Sodium: 138 mEq/L (ref 135–145)
Total Protein: 7.2 g/dL (ref 6.0–8.3)

## 2012-09-19 MED ORDER — ALBUTEROL SULFATE HFA 108 (90 BASE) MCG/ACT IN AERS
2.0000 | INHALATION_SPRAY | RESPIRATORY_TRACT | Status: DC | PRN
  Filled 2012-09-19: qty 6.7

## 2012-09-19 MED ORDER — MORPHINE SULFATE ER 30 MG PO TBCR
60.0000 mg | EXTENDED_RELEASE_TABLET | Freq: Three times a day (TID) | ORAL | Status: DC
  Administered 2012-09-19 – 2012-09-20 (×2): 60 mg via ORAL
  Filled 2012-09-19 (×2): qty 2

## 2012-09-19 MED ORDER — METHYLPREDNISOLONE SODIUM SUCC 125 MG IJ SOLR
125.0000 mg | Freq: Once | INTRAMUSCULAR | Status: AC
  Administered 2012-09-19: 125 mg via INTRAVENOUS
  Filled 2012-09-19: qty 2

## 2012-09-19 MED ORDER — LEVOFLOXACIN IN D5W 750 MG/150ML IV SOLN
750.0000 mg | INTRAVENOUS | Status: DC
  Filled 2012-09-19: qty 150

## 2012-09-19 MED ORDER — ALBUTEROL SULFATE (5 MG/ML) 0.5% IN NEBU
5.0000 mg | INHALATION_SOLUTION | Freq: Once | RESPIRATORY_TRACT | Status: AC
  Administered 2012-09-19: 5 mg via RESPIRATORY_TRACT
  Filled 2012-09-19: qty 1

## 2012-09-19 MED ORDER — BISACODYL 5 MG PO TBEC: 5.0000 mg | DELAYED_RELEASE_TABLET | Freq: Every day | ORAL | Status: DC | PRN

## 2012-09-19 MED ORDER — LEVOFLOXACIN 500 MG PO TABS
500.0000 mg | ORAL_TABLET | Freq: Once | ORAL | Status: AC
  Administered 2012-09-19: 500 mg via ORAL
  Filled 2012-09-19: qty 1

## 2012-09-19 MED ORDER — ALUM & MAG HYDROXIDE-SIMETH 200-200-20 MG/5ML PO SUSP: 30.0000 mL | Freq: Four times a day (QID) | ORAL | Status: DC | PRN

## 2012-09-19 MED ORDER — ONDANSETRON HCL 4 MG PO TABS: 4.0000 mg | ORAL_TABLET | Freq: Four times a day (QID) | ORAL | Status: DC | PRN

## 2012-09-19 MED ORDER — SODIUM CHLORIDE 0.9 % IJ SOLN
3.0000 mL | Freq: Two times a day (BID) | INTRAMUSCULAR | Status: DC
  Administered 2012-09-19: 3 mL via INTRAVENOUS

## 2012-09-19 MED ORDER — ALBUTEROL SULFATE (5 MG/ML) 0.5% IN NEBU
2.5000 mg | INHALATION_SOLUTION | RESPIRATORY_TRACT | Status: DC
  Administered 2012-09-19: 2.5 mg via RESPIRATORY_TRACT
  Filled 2012-09-19: qty 0.5

## 2012-09-19 MED ORDER — ONDANSETRON HCL 4 MG/2ML IJ SOLN: 4.0000 mg | Freq: Three times a day (TID) | INTRAMUSCULAR | Status: DC | PRN

## 2012-09-19 MED ORDER — MORPHINE SULFATE 15 MG PO TABS
15.0000 mg | ORAL_TABLET | ORAL | Status: DC | PRN
  Administered 2012-09-19 – 2012-09-20 (×3): 15 mg via ORAL
  Filled 2012-09-19 (×3): qty 1

## 2012-09-19 MED ORDER — METHYLPREDNISOLONE SODIUM SUCC 40 MG IJ SOLR
40.0000 mg | Freq: Four times a day (QID) | INTRAMUSCULAR | Status: DC
  Administered 2012-09-19 – 2012-09-20 (×3): 40 mg via INTRAVENOUS
  Filled 2012-09-19 (×7): qty 1

## 2012-09-19 MED ORDER — ENOXAPARIN SODIUM 40 MG/0.4ML ~~LOC~~ SOLN
40.0000 mg | SUBCUTANEOUS | Status: DC
  Administered 2012-09-19: 40 mg via SUBCUTANEOUS
  Filled 2012-09-19 (×3): qty 0.4

## 2012-09-19 MED ORDER — POLYETHYLENE GLYCOL 3350 17 G PO PACK
17.0000 g | PACK | ORAL | Status: DC
  Administered 2012-09-20: 17 g via ORAL
  Filled 2012-09-19: qty 1

## 2012-09-19 MED ORDER — ACYCLOVIR 800 MG PO TABS
800.0000 mg | ORAL_TABLET | Freq: Two times a day (BID) | ORAL | Status: DC
  Administered 2012-09-19 – 2012-09-20 (×2): 800 mg via ORAL
  Filled 2012-09-19 (×4): qty 1

## 2012-09-19 MED ORDER — ACETAMINOPHEN 325 MG PO TABS: 650.0000 mg | ORAL_TABLET | Freq: Four times a day (QID) | ORAL | Status: DC | PRN

## 2012-09-19 MED ORDER — IPRATROPIUM BROMIDE 0.02 % IN SOLN
0.5000 mg | Freq: Once | RESPIRATORY_TRACT | Status: AC
  Administered 2012-09-19: 0.5 mg via RESPIRATORY_TRACT
  Filled 2012-09-19: qty 2.5

## 2012-09-19 MED ORDER — MAGNESIUM HYDROXIDE 400 MG/5ML PO SUSP: 30.0000 mL | Freq: Every day | ORAL | Status: DC | PRN

## 2012-09-19 MED ORDER — METHYLPREDNISOLONE SODIUM SUCC 40 MG IJ SOLR: 40.0000 mg | Freq: Four times a day (QID) | INTRAMUSCULAR | Status: DC

## 2012-09-19 MED ORDER — PROCHLORPERAZINE MALEATE 10 MG PO TABS: 10.0000 mg | ORAL_TABLET | Freq: Four times a day (QID) | ORAL | Status: DC | PRN

## 2012-09-19 MED ORDER — ACETAMINOPHEN 325 MG PO TABS
ORAL_TABLET | ORAL | Status: AC
  Administered 2012-09-19: 650 mg
  Filled 2012-09-19: qty 2

## 2012-09-19 MED ORDER — ONDANSETRON HCL 4 MG/2ML IJ SOLN: 4.0000 mg | Freq: Four times a day (QID) | INTRAMUSCULAR | Status: DC | PRN

## 2012-09-19 MED ORDER — SODIUM CHLORIDE 0.9 % IV SOLN: 250.0000 mL | INTRAVENOUS | Status: DC | PRN

## 2012-09-19 MED ORDER — SODIUM CHLORIDE 0.9 % IJ SOLN: 3.0000 mL | INTRAMUSCULAR | Status: DC | PRN

## 2012-09-19 NOTE — Progress Notes (Addendum)
Chief Complaint  Patient presents with  . Cough    congestion, mucus-brown color since Tuesday    HPI:  Cough, congestion: -started 2-3 days ago -symptoms: cough, nasal congestion, malaise, SOB, chills - feels like has had a fever -denies: NVD, rash, hemoptysis  -currently getting chemo for multiple myeloma (Last tx 3 days ago) - just had labs 11/15 and counts looked good - mild thrombocytopenia chronic    ROS: See pertinent positives and negatives per HPI.  Past Medical History  Diagnosis Date  . Depression   . Hypertension   . Hepatitis C     genotype 1  . Diverticulosis of colon   . Multiple myeloma     kappa light chain, s/p high-dose chemo/stem cell tx - [facility]  . Hx of adenomatous colonic polyps 2007  . Anxiety   . Osteoarthritis   . Sleep apnea   . Hyperlipidemia   . GERD with stricture   . External hemorrhoids   . Hepatitis B virus infection   . IBS (irritable bowel syndrome)   . Helicobacter pylori gastritis 2001    ? if treated  . Sciatic nerve pain     with Dr. Delma Officer  . Chronic low back pain     Family History  Problem Relation Age of Onset  . Alcohol abuse    . Lung cancer Father   . Leukemia Mother   . Cirrhosis Sister   . Colon cancer Neg Hx     History   Social History  . Marital Status: Married    Spouse Name: N/A    Number of Children: N/A  . Years of Education: N/A   Social History Main Topics  . Smoking status: Current Every Day Smoker -- 0.3 packs/day for 40 years    Types: Cigarettes  . Smokeless tobacco: None  . Alcohol Use: No  . Drug Use: No  . Sexually Active: None   Other Topics Concern  . None   Social History Narrative  . None    Current outpatient prescriptions:acyclovir (ZOVIRAX) 400 MG tablet, Take 400 mg by mouth 2 (two) times daily.  , Disp: , Rfl: ;  Bisacodyl (LAXATIVE PO), Take by mouth. As needed , Disp: , Rfl: ;  diazepam (VALIUM) 5 MG tablet, Take 1 tablet (5 mg total) by mouth as needed., Disp: 20  tablet, Rfl: 3;  DULoxetine (CYMBALTA) 30 MG capsule, Take 30 mg by mouth daily., Disp: , Rfl:  Magnesium Hydroxide (MILK OF MAGNESIA PO), Take by mouth daily as needed.  , Disp: , Rfl: ;  morphine (MS CONTIN) 60 MG 12 hr tablet, Take 60 mg by mouth every 8 (eight) hours. , Disp: , Rfl: ;  nystatin-triamcinolone ointment (MYCOLOG), Apply topically 2 (two) times daily., Disp: 60 g, Rfl: 1;  oxymorphone (OPANA) 10 MG tablet, Take 10 mg by mouth every 6 (six) hours as needed. 1-2 tabs, Disp: , Rfl:  polyethylene glycol (MIRALAX / GLYCOLAX) packet, Take 17 g by mouth every other day.  , Disp: , Rfl: ;  prochlorperazine (COMPAZINE) 10 MG tablet, Take 10 mg by mouth every 6 (six) hours as needed.  , Disp: , Rfl: ;  senna-docusate (SENOKOT-S) 8.6-50 MG per tablet, Take 2 tablets by mouth daily., Disp: , Rfl: ;  Sodium Phosphates (ENEMA RE), Place rectally. As needed , Disp: , Rfl:  [DISCONTINUED] amLODipine (NORVASC) 5 MG tablet, Take 1 tablet (5 mg total) by mouth daily., Disp: 90 tablet, Rfl: 3;  [DISCONTINUED] gabapentin (NEURONTIN)  600 MG tablet, Take 600 mg by mouth 3 (three) times daily.  , Disp: , Rfl: ;  [DISCONTINUED] lisinopril (PRINIVIL,ZESTRIL) 20 MG tablet, Take 1 tablet (20 mg total) by mouth daily., Disp: 90 tablet, Rfl: 11  EXAM:  Filed Vitals:   09/19/12 1058  Pulse: 111  Temp: 98.2 F (36.8 C)  O2 84-88% on RA  There is no height on file to calculate BMI.  GENERAL: vitals reviewed and listed above, alert, oriented, appears well hydrated and in mild resp distress  HEENT: atraumatic, conjunttiva clear, no obvious abnormalities on inspection of external nose and ears, normal appearance of ear canals and TMs, clear nasal congestion, mild post oropharyngeal erythema with PND, no tonsillar edema or exudate, no sinus TTP  NECK: no obvious masses on inspection  LUNGS: diffuse wheezing   CV: HRRR, no peripheral edema  MS: moves all extremities without noticeable abnormality  PSYCH:  pleasant and cooperative, no obvious depression or anxiety  ASSESSMENT AND PLAN:  Discussed the following assessment and plan:  1. Hypoxia   2. Wheezing   3. Dyspnea    -O2 sats 84-88 in frail 61 yo female undergoing chemo for MM in mild resp distress with diffuse wheezing  -will send to ED for monitoring and further evaluation - EMS transport -O2 and breathing treatment provided and O2 to 90% on 2L after treatment, increased O2 to 3L -CNA to notified ED -Patient advised to return or notify a doctor immediately if symptoms worsen or persist or new concerns arise.  There are no Patient Instructions on file for this visit.   Kriste Basque R.

## 2012-09-19 NOTE — ED Notes (Signed)
Patient went to Dr. Isidore Moos this am, complaining of worsening SOB. While at office, patient's sats were in the mid-80's, a simple face mask was applied and EMS was called to have patient transported to [facility] for further workup.

## 2012-09-19 NOTE — ED Provider Notes (Signed)
History     CSN: [id number]  Arrival date & time 09/19/12  1203   First MD Initiated Contact with Patient 09/19/12 1258      Chief Complaint  Patient presents with  . Shortness of Breath    (Consider location/radiation/quality/duration/timing/severity/associated sxs/prior treatment) HPI  Patient with uri symptoms, cough and cold with dyspnea.  Presented to Dr. Marliss Coots office this a.m. And seen by other physician and sent here due to low oxygen sats.  Patient with history of multiple myeloma 2007 s/p stem cell with remission  On chemo for two years here.  Patient states she was outside Tuesday.  States started feelin gbad on Thursday with tired, cough, runny nose, no fever.  Worse Saturday with productive brownish mucous slight subjective fever last night.  Denies chills,  Dyspnea since Friday.  Po well without vomiting or diarrhea.  Patient smokes not on oxygen.  Denies history of copd or pneumonia.  No hospitalizations since stem cell tx last February.    Past Medical History  Diagnosis Date  . Depression   . Hypertension   . Hepatitis C     genotype 1  . Diverticulosis of colon   . Multiple myeloma     kappa light chain, s/p high-dose chemo/stem cell tx - [facility]  . Hx of adenomatous colonic polyps 2007  . Anxiety   . Osteoarthritis   . Sleep apnea   . Hyperlipidemia   . GERD with stricture   . External hemorrhoids   . Hepatitis B virus infection   . IBS (irritable bowel syndrome)   . Helicobacter pylori gastritis 2001    ? if treated  . Sciatic nerve pain     with Dr. Delma Officer  . Chronic low back pain     Past Surgical History  Procedure Date  . Abdominal hysterectomy   . Oophorectomy   . Dilation and curettage of uterus   . Bone marrow transplant   . Appendectomy   . Colonoscopy w/ polypectomy 08/2006    1-2 adenomas, 6 polyps total, diverticulosis and external hemorrhoids  . Upper gastrointestinal endoscopy 08/2003    esophageal stricture dilation, hiatal  hernia, gastrritis  . Cholecystectomy   . Tonsillectomy and adenoidectomy     Family History  Problem Relation Age of Onset  . Alcohol abuse    . Lung cancer Father   . Leukemia Mother   . Cirrhosis Sister   . Colon cancer Neg Hx     History  Substance Use Topics  . Smoking status: Current Every Day Smoker -- 0.3 packs/day for 40 years    Types: Cigarettes  . Smokeless tobacco: Not on file  . Alcohol Use: No    OB History    Grav Para Term Preterm Abortions TAB SAB Ect Mult Living                  Review of Systems  All other systems reviewed and are negative.    Allergies  Codeine and Ibuprofen  Home Medications   Current Outpatient Rx  Name  Route  Sig  Dispense  Refill  . ACYCLOVIR 400 MG PO TABS   Oral   Take 800 mg by mouth 2 (two) times daily.          Marland Kitchen LAXATIVE PO   Oral   Take by mouth. As needed         . DIAZEPAM 5 MG PO TABS   Oral   Take 1 tablet (5 mg  total) by mouth as needed.   20 tablet   3   . MILK OF MAGNESIA PO   Oral   Take by mouth daily as needed.           . MORPHINE SULFATE ER 60 MG PO TB12   Oral   Take 60 mg by mouth every 8 (eight) hours.          . MORPHINE SULFATE 15 MG PO TABS   Oral   Take 15 mg by mouth every 4 (four) hours as needed. pain         . POLYETHYLENE GLYCOL 3350 PO PACK   Oral   Take 17 g by mouth every other day.           Marland Kitchen PROCHLORPERAZINE MALEATE 10 MG PO TABS   Oral   Take 10 mg by mouth every 6 (six) hours as needed. nausea           There were no vitals taken for this visit.  Physical Exam  Nursing note and vitals reviewed. Constitutional: She appears well-developed and well-nourished.  HENT:  Head: Normocephalic and atraumatic.  Right Ear: External ear normal.  Left Ear: External ear normal.  Nose: Nose normal.  Mouth/Throat: Oropharynx is clear and moist.  Eyes: Conjunctivae normal are normal. Pupils are equal, round, and reactive to light.  Neck: Normal range of  motion. Neck supple.  Cardiovascular: Normal rate, regular rhythm and normal heart sounds.   Pulmonary/Chest: Effort normal. She has wheezes. She has rales.  Abdominal: Soft. Bowel sounds are normal.  Musculoskeletal: Normal range of motion. She exhibits no edema and no tenderness.  Neurological: She is alert.  Skin: Skin is warm and dry.  Psychiatric: She has a normal mood and affect. Her behavior is normal.    ED Course  Procedures (including critical care time)  Labs Reviewed - No data to display No results found.   No diagnosis found. Results for orders placed during the hospital encounter of 09/19/12  CBC WITH DIFFERENTIAL      Component Value Range   WBC 5.9  4.0 - 10.5 K/uL   RBC 4.71  3.87 - 5.11 MIL/uL   Hemoglobin 15.0  12.0 - 15.0 g/dL   HCT 96.0  45.4 - 09.8 %   MCV 91.9  78.0 - 100.0 fL   MCH 31.8  26.0 - 34.0 pg   MCHC 34.6  30.0 - 36.0 g/dL   RDW 11.9  14.7 - 82.9 %   Platelets 127 (*) 150 - 400 K/uL   Neutrophils Relative 60  43 - 77 %   Neutro Abs 3.5  1.7 - 7.7 K/uL   Lymphocytes Relative 27  12 - 46 %   Lymphs Abs 1.6  0.7 - 4.0 K/uL   Monocytes Relative 10  3 - 12 %   Monocytes Absolute 0.6  0.1 - 1.0 K/uL   Eosinophils Relative 3  0 - 5 %   Eosinophils Absolute 0.2  0.0 - 0.7 K/uL   Basophils Relative 1  0 - 1 %   Basophils Absolute 0.0  0.0 - 0.1 K/uL  COMPREHENSIVE METABOLIC PANEL      Component Value Range   Sodium 138  135 - 145 mEq/L   Potassium 4.2  3.5 - 5.1 mEq/L   Chloride 103  96 - 112 mEq/L   CO2 27  19 - 32 mEq/L   Glucose, Bld 95  70 - 99 mg/dL   BUN 13  6 - 23 mg/dL   Creatinine, Ser 2.13  0.50 - 1.10 mg/dL   Calcium 9.0  8.4 - 08.6 mg/dL   Total Protein 7.2  6.0 - 8.3 g/dL   Albumin 3.6  3.5 - 5.2 g/dL   AST 58 (*) 0 - 37 U/L   ALT 51 (*) 0 - 35 U/L   Alkaline Phosphatase 80  39 - 117 U/L   Total Bilirubin 0.3  0.3 - 1.2 mg/dL   GFR calc non Af Amer >90  >90 mL/min   GFR calc Af Amer >90  >90 mL/min     MDM  61 y.o.  Female smoker with multiple medical problems sent from pmd with sats decreased to mid 80s.  Patient reports uri symptoms for a week with increasing dyspnea and sputum production. Patient with continued wheezing after nebulizer x 2, solumedrol.  Patient with some decrease in wheezing but continues new oxygen requirement.  CXR with no acute infiltrate, but levaquin started due to history of smoking with change in sputum production.   Discussed with Dr. Irene Limbo and plan admission to team 6.        Hilario Quarry, MD 09/19/12 [phone]

## 2012-09-19 NOTE — H&P (Signed)
History and Physical  Kendra Johns EAV:[id number] DOB: [birth year] DOA: 09/19/2012  Referring physician: Margarita Grizzle, MD PCP: [name], MD  Oncologist: Gerome Apley, MD  Chief Complaint: cough  HPI:  61 year old woman presented to ED from PCP for evaluation of hypoxia. Referred for admission.  Current smoker without diagnosis of COPD, use of bronchodilators or oxygen developed cough, congestion and fatigue 7 days after spening 6 hours out in the rain looking for a car. Since that day cough and congestion has progressively worsening, especially over the last 3 days, now with increasing dyspnea on exertion. Mucinex did not help. No recent antibiotics. Seen in PCP's office today with SpO2 84-84 in mild distress with acute wheezing and sent to ED.   In ED hypoxia was confirmed, CXR was negative for acute process and CBC and BMP were unremarkable. Based on history and clinical exam findings, bronchospasm was presumed and was treated with nebs, Levaquin and methylprednisolone.  PMH multiple myeloma, s/p second bone marrow transplant currently on Velcade.  Chart Review:  As above  Review of Systems:  Negative for fever, chills, night sweats, visual changes, sore throat, rash, new muscle aches, chest pain, dysuria, bleeding, n/v/abdominal pain.   Past Medical History  Diagnosis Date  . Depression   . Hypertension   . Hepatitis C     genotype 1  . Diverticulosis of colon   . Multiple myeloma     kappa light chain, s/p high-dose chemo/stem cell tx - [facility]  . Hx of adenomatous colonic polyps 2007  . Anxiety   . Osteoarthritis   . Sleep apnea   . Hyperlipidemia   . GERD with stricture   . External hemorrhoids   . Hepatitis B virus infection   . IBS (irritable bowel syndrome)   . Helicobacter pylori gastritis 2001    ? if treated  . Sciatic nerve pain     with Dr. Delma Officer  . Chronic low back pain     Past Surgical History  Procedure Date  . Abdominal hysterectomy   .  Oophorectomy   . Dilation and curettage of uterus   . Bone marrow transplant   . Appendectomy   . Colonoscopy w/ polypectomy 08/2006    1-2 adenomas, 6 polyps total, diverticulosis and external hemorrhoids  . Upper gastrointestinal endoscopy 08/2003    esophageal stricture dilation, hiatal hernia, gastrritis  . Cholecystectomy   . Tonsillectomy and adenoidectomy     Social History:  reports that she has been smoking Cigarettes.  She has a 12 pack-year smoking history. She has never used smokeless tobacco. She reports that she does not drink alcohol or use illicit drugs.  Allergies  Allergen Reactions  . Codeine   . Ibuprofen     REACTION: gi    Family History  Problem Relation Age of Onset  . Alcohol abuse    . Lung cancer Father   . Leukemia Mother   . Cirrhosis Sister   . Colon cancer Neg Hx      Prior to Admission medications   Medication Sig Start Date End Date Taking? Authorizing Provider  acyclovir (ZOVIRAX) 400 MG tablet Take 800 mg by mouth 2 (two) times daily.    Yes Historical Provider, MD  Bisacodyl (LAXATIVE PO) Take by mouth. As needed   Yes Historical Provider, MD  diazepam (VALIUM) 5 MG tablet Take 1 tablet (5 mg total) by mouth as needed. 11/26/11  Yes Lindley Magnus, MD  Magnesium Hydroxide (MILK OF MAGNESIA PO)  Take by mouth daily as needed.     Yes Historical Provider, MD  morphine (MS CONTIN) 60 MG 12 hr tablet Take 60 mg by mouth every 8 (eight) hours.    Yes Thyra Breed, MD  morphine (MSIR) 15 MG tablet Take 15 mg by mouth every 4 (four) hours as needed. pain   Yes Historical Provider, MD  polyethylene glycol (MIRALAX / GLYCOLAX) packet Take 17 g by mouth every other day.     Yes Historical Provider, MD  prochlorperazine (COMPAZINE) 10 MG tablet Take 10 mg by mouth every 6 (six) hours as needed. nausea   Yes Historical Provider, MD   Physical Exam: Filed Vitals:   09/19/12 1330 09/19/12 1524  BP:  100/64  Pulse:  92  Temp: 100.5 F (38.1 C) 98.5  F (36.9 C)  TempSrc: Rectal Oral  SpO2:  93%    General:  Examined in ED, appears calm and comfortable Eyes: PERRL, normal lids, irises; wears glasses ENT: grossly normal hearing, lips & tongue Neck: no LAD, masses or thyromegaly Cardiovascular: RRR, no m/r/g. No LE edema. Respiratory: very poor air movement, audible wheeze, rhochi right posterior field. Normal respiratory effort. Abdomen: soft, ntnd Skin: no rash or induration seen on limited exam Musculoskeletal: grossly normal tone BUE/BLE Psychiatric: grossly normal mood and affect, speech fluent and appropriate Neurologic: grossly non-focal.  Labs on Admission:  Basic Metabolic Panel:  Lab 09/19/12 4098 09/16/12 1136  NA 138 137  K 4.2 4.0  CL 103 105  CO2 27 26  GLUCOSE 95 117*  BUN 13 13.0  CREATININE 0.70 0.8  CALCIUM 9.0 9.7  MG -- --  PHOS -- --    Liver Function Tests:  Lab 09/19/12 1350 09/16/12 1136  AST 58* 47*  ALT 51* 46  ALKPHOS 80 79  BILITOT 0.3 0.51  PROT 7.2 6.9  ALBUMIN 3.6 3.8   CBC:  Lab 09/19/12 1350 09/16/12 1136  WBC 5.9 5.2  NEUTROABS 3.5 3.3  HGB 15.0 14.4  HCT 43.3 42.1  MCV 91.9 95.1  PLT 127* 146   Radiological Exams on Admission: Dg Chest 2 View  09/19/2012  *RADIOLOGY REPORT*  Clinical Data: Cough.  Short of breath.  CHEST - 2 VIEW  Comparison: None.  Findings: The cardiac silhouette is normal in size and configuration.  No mediastinal or hilar masses or adenopathy are noted.  There is mild irregular thickening of the bronchovascular markings. The lungs otherwise clear.  No pleural effusion or pneumothorax.  The bony thorax is demineralized.  There is a minor wedge-shaped compression deformity of a mid thoracic vertebra of unclear chronicity.  IMPRESSION: No acute cardiopulmonary disease.   Original Report Authenticated By: Amie Portland, M.D.     EKG: Independently reviewed. SR, no acute changes.   Principal Problem:  *Acute respiratory failure with hypoxia Active  Problems:  MULTIPLE  MYELOMA  TOBACCO ABUSE  Low back pain  Acute bronchitis   Assessment/Plan 1. Acute hypoxic respiratory failure--as below 2. Acute bronchitis, possible CAP--nebs, oxygen, steroids, antibiotics. 3. Smoker--counseled her on cessation 4. Multiple myeloma--last seen by Cancer center 11/15. Noted to be stable on Velcade. Continue prophylactic acyclovir. 5. Hepatitis C with cirrhosis--stable. 6. Chronic low back pain--stable. Continue chronic pain medications.  Code Status: Full code Family Communication: discussed with husband at bedside Disposition Plan/Anticipated LOS: 1-2 days  Time spent: 45 minutes  Brendia Sacks, MD  [facility] Team 6 Pager [phone]. If 8PM-8AM, please contact night-coverage at [email or web address], password [facility] 09/19/2012, 4:50 PM

## 2012-09-19 NOTE — ED Notes (Signed)
Attempted to call report to floor. Nurse unavailable at this time.  Will call back later.

## 2012-09-19 NOTE — ED Notes (Signed)
Pt took 15mg  of morphine sulfate, that she had brought in to the ED with her.  Nurse had asked that she wait and let nurse see if MD thought it was okay, but patient decided to take it anyway.  Will report to floor nurse.

## 2012-09-19 NOTE — ED Notes (Signed)
Bed:WA03<BR> Expected date:<BR> Expected time:<BR> Means of arrival:<BR> Comments:<BR> SOB

## 2012-09-19 NOTE — Telephone Encounter (Signed)
Added tx for 1/10. lmonvm for pt and pt to get new schedule when she comes in on 11/22. All other appts remain the same and time for 11/11/12 appt has not changed.

## 2012-09-20 ENCOUNTER — Other Ambulatory Visit: Payer: Self-pay | Admitting: Oncology

## 2012-09-20 DIAGNOSIS — R630 Anorexia: Secondary | ICD-10-CM

## 2012-09-20 DIAGNOSIS — J449 Chronic obstructive pulmonary disease, unspecified: Secondary | ICD-10-CM

## 2012-09-20 DIAGNOSIS — J209 Acute bronchitis, unspecified: Secondary | ICD-10-CM

## 2012-09-20 DIAGNOSIS — J9600 Acute respiratory failure, unspecified whether with hypoxia or hypercapnia: Secondary | ICD-10-CM

## 2012-09-20 LAB — KAPPA/LAMBDA LIGHT CHAINS
Kappa free light chain: 2.33 mg/dL — ABNORMAL HIGH (ref 0.33–1.94)
Kappa:Lambda Ratio: 1.59 (ref 0.26–1.65)

## 2012-09-20 LAB — PROTEIN ELECTROPHORESIS, SERUM
Albumin ELP: 60 % (ref 55.8–66.1)
Alpha-1-Globulin: 4.8 % (ref 2.9–4.9)
Total Protein, Serum Electrophoresis: 7.3 g/dL (ref 6.0–8.3)

## 2012-09-20 MED ORDER — PREDNISONE (PAK) 10 MG PO TABS: 10.0000 mg | ORAL_TABLET | Freq: Every day | ORAL | Status: DC

## 2012-09-20 MED ORDER — LEVOFLOXACIN 500 MG PO TABS: 500.0000 mg | ORAL_TABLET | Freq: Every day | ORAL | Status: DC

## 2012-09-20 NOTE — Progress Notes (Signed)
Patient discharged home, all discharge medications and instructions reviewed and questions answered. Patient to be assisted to vehicle by wheelchair.  

## 2012-09-20 NOTE — Discharge Summary (Signed)
Physician Discharge Summary  Kendra Johns:[id number] DOB: 1951/01/03 DOA: 09/19/2012  PCP: [name], MD  Admit date: 09/19/2012 Discharge date: 09/20/2012  Recommendations for Outpatient Follow-up:  1. Pt will need to follow up with PCP in 2-3 weeks post discharge 2. Please obtain BMP to evaluate electrolytes and kidney function 3. Please also check CBC to evaluate Hg and Hct levels 4. Please note that pt was feeling well the morning of discharge and felt ready to go home 5. She was discharged on Antibiotic Levaquin to complete the therapy for 5 more days 6. Pt was also discharge on Prednisone taper pack over 3 day period  Discharge Diagnoses: Acute respiratory failure secondary to Acute bronchitis vs pneumonia  Principal Problem:  *Acute respiratory failure with hypoxia Active Problems:  MULTIPLE  MYELOMA  TOBACCO ABUSE  Low back pain  Acute bronchitis  Discharge Condition: Stable  Diet recommendation: Heart healthy diet discussed in details   History of present illness:  61 year old woman presented to ED from PCP for evaluation of hypoxia. Referred for admission. Current smoker without diagnosis of COPD, use of bronchodilators or oxygen developed cough, congestion and fatigue 7 days after spening 6 hours out in the rain looking for a car. Since that day cough and congestion has progressively worsening, especially over the last 3 days, now with increasing dyspnea on exertion. Mucinex did not help. No recent antibiotics. Seen in PCP's office today with SpO2 84-84 in mild distress with acute wheezing and sent to ED.  In ED hypoxia was confirmed, CXR was negative for acute process and CBC and BMP were unremarkable. Based on history and clinical exam findings, bronchospasm was presumed and was treated with nebs, Levaquin and methylprednisolone.  Hospital Course:  1. Acute hypoxic respiratory failure -- secondary to acute bronchitis vs pneumonia, pt feels ready to go  home this am, I will discharge her on Levaquin to complete the therapy for 5 more days, also discharge on prednisone taper pack over 3 days  2. Acute bronchitis, possible CAP--nebs, oxygen, steroids, antibiotics provided on admission and pt has responded well. Will continue ABX PO upon discharge as noted above. 3. Smoker--counseled her on cessation 4. Multiple myeloma--last seen by Cancer center 11/15. Noted to be stable on Velcade. Continue prophylactic acyclovir. 5. Hepatitis C with cirrhosis--stable. 6. Chronic low back pain--stable. Continued chronic pain medications  Procedures/Studies: Dg Chest 2 View 09/19/2012  No acute cardiopulmonary disease.    Consultations:  None  Antibiotics:  Levaquin 11/18 --> 09/25/2012  Discharge Exam: Filed Vitals:   09/20/12 0514  BP: 118/89  Pulse: 90  Temp: 98.2 F (36.8 C)  Resp: 20   Filed Vitals:   09/19/12 1524 09/19/12 1748 09/19/12 2045 09/20/12 0514  BP: 100/64 113/80 110/72 118/89  Pulse: 92 85 88 90  Temp: 98.5 F (36.9 C) 97.3 F (36.3 C) 98 F (36.7 C) 98.2 F (36.8 C)  TempSrc: Oral Oral Oral Oral  Resp:  16 20 20   Height:  5\' 4"  (1.626 m)    Weight:  54.885 kg (121 lb)    SpO2: 93% 94% 99% 95%    General: Pt is alert, follows commands appropriately, not in acute distress Cardiovascular: Regular rate and rhythm, S1/S2 +, no murmurs, no rubs, no gallops Respiratory: Clear to auscultation bilaterally, no wheezing, no crackles, no rhonchi Abdominal: Soft, non tender, non distended, bowel sounds +, no guarding Extremities: no edema, no cyanosis, pulses palpable bilaterally DP and PT Neuro: Grossly nonfocal  Discharge Instructions  Discharge Orders    Future Appointments: Provider: Department: Dept Phone: Center:   09/23/2012 10:30 AM [facility] [phone] None   09/23/2012 11:00 AM [facility] [phone] None    09/30/2012 10:30 AM Windell Hum[facility]bird [facility] MED[facility]COL[facility] [phone] None   09/30/2012 11:00 AM [facility] [phone] None   10/14/2012 10:30 AM Radene Gunning [facility] [phone] None   10/14/2012 11:00 AM [facility] [phone] None   10/21/2012 10:30 AM Windell Hum[facility]bird [facility] ONCOL[facility] [phone] None   10/21/2012 11:00 AM [facility] [place] [facility] [phone] None   10/28/2012 10:30 AM Dava Najjar [facility] MEDICAL ONCOLOGY [phone] None   10/28/2012 11:00 AM [facility] [place] [facility] [phone] None   11/11/2012 1:00 PM Dava Najjar [facility] MEDICAL ONCOLOGY [phone] None   11/11/2012 1:30 PM Exie Parody, MD [facility] MEDICAL ONCOLOGY [phone] None   11/11/2012 2:30 PM [facility] [place] [facility] [phone] None   11/16/2012 8:15 AM Lindley Magnus, MD [facility] [phone] [facility]   11/18/2012 3:00 PM [facility] [phone] None     Future Orders Please Complete By Expires   Diet - low sodium heart healthy      Increase activity slowly          Medication List     As of 09/20/2012  9:22 AM    TAKE these medications         acyclovir 400 MG tablet   Commonly known as: ZOVIRAX   Take 800 mg by mouth 2 (two) times daily.      diazepam 5 MG tablet   Commonly known as: VALIUM   Take 1 tablet (5 mg total) by mouth as needed.      LAXATIVE PO   Take by mouth. As needed      levofloxacin 500 MG tablet   Commonly known as: LEVAQUIN   Take 1 tablet (500 mg total) by mouth daily.      MILK OF MAGNESIA PO   Take by mouth daily as needed.      morphine 60 MG  12 hr tablet   Commonly known as: MS CONTIN   Take 60 mg by mouth every 8 (eight) hours.      morphine 15 MG tablet   Commonly known as: MSIR   Take 15 mg by mouth every 4 (four) hours as needed. pain      polyethylene glycol packet   Commonly known as: MIRALAX / GLYCOLAX   Take 17 g by mouth every other day.      predniSONE 10 MG tablet   Commonly known as: STERAPRED UNI-PAK   Take 1 tablet (10 mg total) by mouth daily. Take 30 mg tablet today 11/19, take 20 mg tablet 11/20, take 10 mg tablet 11/21 and than stop      prochlorperazine 10 MG tablet   Commonly known as: COMPAZINE   Take 10 mg by mouth every 6 (six) hours as needed. nausea           Follow-up Information    Follow up with [name], MD. In 4 weeks.   Contact information:  [address] [place] [address] [phone]           The results of significant diagnostics from this hospitalization (including imaging, microbiology, ancillary and laboratory) are listed below for reference.     Microbiology: No results found for this or any previous visit (from the past 240 hour(s)).   Labs: Basic Metabolic Panel:  Lab 09/19/12 0865 09/16/12 1136  NA 138 137  K 4.2 4.0  CL 103 105  CO2 27 26  GLUCOSE 95 117*  BUN 13 13.0  CREATININE 0.70 0.8  CALCIUM 9.0 9.7  MG -- --  PHOS -- --   Liver Function Tests:  Lab 09/19/12 1350 09/16/12 1136  AST 58* 47*  ALT 51* 46  ALKPHOS 80 79  BILITOT 0.3 0.51  PROT 7.2 6.9  ALBUMIN 3.6 3.8   CBC:  Lab 09/19/12 1350 09/16/12 1136  WBC 5.9 5.2  NEUTROABS 3.5 3.3  HGB 15.0 14.4  HCT 43.3 42.1  MCV 91.9 95.1  PLT 127* 146    SIGNED: Time coordinating discharge: Over 30 minutes  Debbora Presto, MD  [facility] 09/20/2012, 9:22 AM Pager [phone]  If 7PM-7AM, please contact night-coverage [email or web address] Password [email or web address]

## 2012-09-23 ENCOUNTER — Other Ambulatory Visit: Payer: Medicare Other

## 2012-09-23 ENCOUNTER — Ambulatory Visit: Payer: Medicare Other

## 2012-09-26 ENCOUNTER — Other Ambulatory Visit: Payer: Medicare Other | Admitting: Lab

## 2012-09-30 ENCOUNTER — Other Ambulatory Visit (HOSPITAL_BASED_OUTPATIENT_CLINIC_OR_DEPARTMENT_OTHER): Payer: Medicare Other | Admitting: Lab

## 2012-09-30 ENCOUNTER — Ambulatory Visit (HOSPITAL_BASED_OUTPATIENT_CLINIC_OR_DEPARTMENT_OTHER): Payer: Medicare Other

## 2012-09-30 VITALS — BP 136/79 | HR 83 | Temp 98.1°F

## 2012-09-30 DIAGNOSIS — Z5112 Encounter for antineoplastic immunotherapy: Secondary | ICD-10-CM

## 2012-09-30 DIAGNOSIS — C9000 Multiple myeloma not having achieved remission: Secondary | ICD-10-CM

## 2012-09-30 LAB — CBC WITH DIFFERENTIAL/PLATELET
BASO%: 0.8 % (ref 0.0–2.0)
Eosinophils Absolute: 0.1 10*3/uL (ref 0.0–0.5)
HCT: 41.5 % (ref 34.8–46.6)
LYMPH%: 33.8 % (ref 14.0–49.7)
MCHC: 34.2 g/dL (ref 31.5–36.0)
MCV: 93 fL (ref 79.5–101.0)
MONO#: 0.7 10*3/uL (ref 0.1–0.9)
MONO%: 9.6 % (ref 0.0–14.0)
NEUT%: 53.9 % (ref 38.4–76.8)
Platelets: 152 10*3/uL (ref 145–400)
WBC: 7.3 10*3/uL (ref 3.9–10.3)

## 2012-09-30 MED ORDER — BORTEZOMIB CHEMO SQ INJECTION 3.5 MG (2.5MG/ML)
1.3000 mg/m2 | Freq: Once | INTRAMUSCULAR | Status: AC
  Administered 2012-09-30: 2 mg via SUBCUTANEOUS
  Filled 2012-09-30: qty 2

## 2012-09-30 MED ORDER — ONDANSETRON HCL 8 MG PO TABS
8.0000 mg | ORAL_TABLET | Freq: Once | ORAL | Status: AC
  Administered 2012-09-30: 8 mg via ORAL

## 2012-09-30 NOTE — Patient Instructions (Signed)
[facility] Discharge Instructions for Patients Receiving Chemotherapy  Today you received the following chemotherapy agents: velcade  To help prevent nausea and vomiting after your treatment, we encourage you to take your nausea medication.  Take it as often as prescribed.     If you develop nausea and vomiting that is not controlled by your nausea medication, call the clinic. If it is after clinic hours your family physician or the after hours number for the clinic or go to the Emergency Department.   BELOW ARE SYMPTOMS THAT SHOULD BE REPORTED IMMEDIATELY:  *FEVER GREATER THAN 100.5 F  *CHILLS WITH OR WITHOUT FEVER  NAUSEA AND VOMITING THAT IS NOT CONTROLLED WITH YOUR NAUSEA MEDICATION  *UNUSUAL SHORTNESS OF BREATH  *UNUSUAL BRUISING OR BLEEDING  TENDERNESS IN MOUTH AND THROAT WITH OR WITHOUT PRESENCE OF ULCERS  *URINARY PROBLEMS  *BOWEL PROBLEMS  UNUSUAL RASH Items with * indicate a potential emergency and should be followed up as soon as possible.  Feel free to call the clinic you have any questions or concerns. The clinic phone number is [phone].   I have been informed and understand all the instructions given to me. I know to contact the clinic, my physician, or go to the Emergency Department if any problems should occur. I do not have any questions at this time, but understand that I may call the clinic during office hours   should I have any questions or need assistance in obtaining follow up care.    __________________________________________  _____________  __________ Signature of Patient or Authorized Representative            Date                   Time    __________________________________________ Nurse's Signature    

## 2012-10-03 ENCOUNTER — Ambulatory Visit (INDEPENDENT_AMBULATORY_CARE_PROVIDER_SITE_OTHER): Payer: Medicare Other | Admitting: Internal Medicine

## 2012-10-03 ENCOUNTER — Encounter: Payer: Self-pay | Admitting: Internal Medicine

## 2012-10-03 VITALS — BP 122/84 | HR 83 | Temp 98.6°F | Wt 123.0 lb

## 2012-10-03 DIAGNOSIS — I10 Essential (primary) hypertension: Secondary | ICD-10-CM

## 2012-10-03 DIAGNOSIS — J9600 Acute respiratory failure, unspecified whether with hypoxia or hypercapnia: Secondary | ICD-10-CM

## 2012-10-03 DIAGNOSIS — J9601 Acute respiratory failure with hypoxia: Secondary | ICD-10-CM

## 2012-10-03 NOTE — Progress Notes (Signed)
Patient ID: Kendra Johns, female   DOB: Sep 22, 1951, 61 y.o.   MRN: [id number] Post hospital visit-- reviewed d/c summary and labs Her breathing is back to nomral and she is still SMOKING  i have been asked to f/u bmet and cbc. Reviewed these values from the hospital-- they are normal-- no need for f/u  Multiple myeloma-- has appt with oncology this month.   Reviewed pmh, psh, soc hx   patient denies chest pain, shortness of breath, orthopnea. Denies lower extremity edema, abdominal pain, change in appetite, change in bowel movements. Patient denies rashes, musculoskeletal complaints. No other specific complaints in a complete review of systems.    Well-developed well-nourished female in no acute distress. HEENT exam atraumatic, normocephalic, extraocular muscles are intact. Neck is supple. No jugular venous distention no thyromegaly. Chest clear to auscultation without increased work of breathing. Cardiac exam S1 and S2 are regular. Abdominal exam active bowel sounds, soft, nontender. Extremities no edema. Neurologic exam she is alert without any motor sensory deficits. Gait is normal.

## 2012-10-03 NOTE — Assessment & Plan Note (Signed)
Well controlled Continue meds Check labs today 

## 2012-10-03 NOTE — Assessment & Plan Note (Signed)
Symptoms are much improved nees to quit smoking She refuses any intervention

## 2012-10-14 ENCOUNTER — Other Ambulatory Visit (HOSPITAL_BASED_OUTPATIENT_CLINIC_OR_DEPARTMENT_OTHER): Payer: Medicare Other | Admitting: Lab

## 2012-10-14 ENCOUNTER — Ambulatory Visit (HOSPITAL_BASED_OUTPATIENT_CLINIC_OR_DEPARTMENT_OTHER): Payer: Medicare Other

## 2012-10-14 VITALS — BP 108/79 | HR 71 | Temp 98.4°F | Resp 20

## 2012-10-14 DIAGNOSIS — Z5112 Encounter for antineoplastic immunotherapy: Secondary | ICD-10-CM

## 2012-10-14 DIAGNOSIS — C9000 Multiple myeloma not having achieved remission: Secondary | ICD-10-CM

## 2012-10-14 LAB — CBC WITH DIFFERENTIAL/PLATELET
Basophils Absolute: 0 10*3/uL (ref 0.0–0.1)
EOS%: 3.3 % (ref 0.0–7.0)
HCT: 42.6 % (ref 34.8–46.6)
HGB: 14.8 g/dL (ref 11.6–15.9)
MCH: 32.7 pg (ref 25.1–34.0)
MCHC: 34.7 g/dL (ref 31.5–36.0)
MCV: 94.3 fL (ref 79.5–101.0)
MONO%: 8.5 % (ref 0.0–14.0)
NEUT%: 55 % (ref 38.4–76.8)

## 2012-10-14 LAB — COMPREHENSIVE METABOLIC PANEL (CC13)
AST: 48 U/L — ABNORMAL HIGH (ref 5–34)
Alkaline Phosphatase: 80 U/L (ref 40–150)
BUN: 15 mg/dL (ref 7.0–26.0)
Calcium: 9.5 mg/dL (ref 8.4–10.4)
Creatinine: 0.8 mg/dL (ref 0.6–1.1)
Total Bilirubin: 0.35 mg/dL (ref 0.20–1.20)

## 2012-10-14 MED ORDER — ONDANSETRON HCL 8 MG PO TABS
8.0000 mg | ORAL_TABLET | Freq: Once | ORAL | Status: AC
  Administered 2012-10-14: 8 mg via ORAL

## 2012-10-14 MED ORDER — BORTEZOMIB CHEMO SQ INJECTION 3.5 MG (2.5MG/ML)
1.3000 mg/m2 | Freq: Once | INTRAMUSCULAR | Status: AC
  Administered 2012-10-14: 2 mg via SUBCUTANEOUS
  Filled 2012-10-14: qty 2

## 2012-10-14 NOTE — Patient Instructions (Addendum)
[facility] Discharge Instructions for Patients Receiving Chemotherapy  Today you received the following chemotherapy agents Velcade  To help prevent nausea and vomiting after your treatment, we encourage you to take your nausea medication as prescribed.   If you develop nausea and vomiting that is not controlled by your nausea medication, call the clinic. If it is after clinic hours your family physician or the after hours number for the clinic or go to the Emergency Department.   BELOW ARE SYMPTOMS THAT SHOULD BE REPORTED IMMEDIATELY:  *FEVER GREATER THAN 100.5 F  *CHILLS WITH OR WITHOUT FEVER  NAUSEA AND VOMITING THAT IS NOT CONTROLLED WITH YOUR NAUSEA MEDICATION  *UNUSUAL SHORTNESS OF BREATH  *UNUSUAL BRUISING OR BLEEDING  TENDERNESS IN MOUTH AND THROAT WITH OR WITHOUT PRESENCE OF ULCERS  *URINARY PROBLEMS  *BOWEL PROBLEMS  UNUSUAL RASH Items with * indicate a potential emergency and should be followed up as soon as possible.  Feel free to call the clinic you have any questions or concerns. The clinic phone number is [phone].   I have been informed and understand all the instructions given to me. I know to contact the clinic, my physician, or go to the Emergency Department if any problems should occur. I do not have any questions at this time, but understand that I may call the clinic during office hours   should I have any questions or need assistance in obtaining follow up care.    

## 2012-10-17 LAB — KAPPA/LAMBDA LIGHT CHAINS: Kappa:Lambda Ratio: 1.49 (ref 0.26–1.65)

## 2012-10-21 ENCOUNTER — Ambulatory Visit (HOSPITAL_BASED_OUTPATIENT_CLINIC_OR_DEPARTMENT_OTHER): Payer: Medicare Other

## 2012-10-21 ENCOUNTER — Other Ambulatory Visit (HOSPITAL_BASED_OUTPATIENT_CLINIC_OR_DEPARTMENT_OTHER): Payer: Medicare Other | Admitting: Lab

## 2012-10-21 VITALS — BP 140/86 | HR 75 | Temp 97.9°F

## 2012-10-21 DIAGNOSIS — C9000 Multiple myeloma not having achieved remission: Secondary | ICD-10-CM

## 2012-10-21 DIAGNOSIS — Z5112 Encounter for antineoplastic immunotherapy: Secondary | ICD-10-CM

## 2012-10-21 LAB — CBC WITH DIFFERENTIAL/PLATELET
BASO%: 0.7 % (ref 0.0–2.0)
HCT: 41.9 % (ref 34.8–46.6)
HGB: 14.2 g/dL (ref 11.6–15.9)
MCHC: 33.9 g/dL (ref 31.5–36.0)
MONO#: 0.5 10*3/uL (ref 0.1–0.9)
NEUT#: 3.1 10*3/uL (ref 1.5–6.5)
NEUT%: 53.6 % (ref 38.4–76.8)
WBC: 5.8 10*3/uL (ref 3.9–10.3)
lymph#: 2 10*3/uL (ref 0.9–3.3)

## 2012-10-21 MED ORDER — BORTEZOMIB CHEMO SQ INJECTION 3.5 MG (2.5MG/ML)
1.3000 mg/m2 | Freq: Once | INTRAMUSCULAR | Status: AC
  Administered 2012-10-21: 2 mg via SUBCUTANEOUS
  Filled 2012-10-21: qty 2

## 2012-10-21 MED ORDER — ONDANSETRON HCL 8 MG PO TABS
8.0000 mg | ORAL_TABLET | Freq: Once | ORAL | Status: AC
  Administered 2012-10-21: 8 mg via ORAL

## 2012-10-21 NOTE — Patient Instructions (Signed)
[facility] Discharge Instructions for Patients Receiving Chemotherapy  Today you received the following chemotherapy agents: Velcade  To help prevent nausea and vomiting after your treatment, we encourage you to take your nausea medication as directed by your MD.  If you develop nausea and vomiting that is not controlled by your nausea medication, call the clinic. If it is after clinic hours your family physician or the after hours number for the clinic or go to the Emergency Department.   BELOW ARE SYMPTOMS THAT SHOULD BE REPORTED IMMEDIATELY:  *FEVER GREATER THAN 100.5 F  *CHILLS WITH OR WITHOUT FEVER  NAUSEA AND VOMITING THAT IS NOT CONTROLLED WITH YOUR NAUSEA MEDICATION  *UNUSUAL SHORTNESS OF BREATH  *UNUSUAL BRUISING OR BLEEDING  TENDERNESS IN MOUTH AND THROAT WITH OR WITHOUT PRESENCE OF ULCERS  *URINARY PROBLEMS  *BOWEL PROBLEMS  UNUSUAL RASH Items with * indicate a potential emergency and should be followed up as soon as possible.   Feel free to call the clinic you have any questions or concerns. The clinic phone number is [phone].    

## 2012-10-28 ENCOUNTER — Other Ambulatory Visit (HOSPITAL_BASED_OUTPATIENT_CLINIC_OR_DEPARTMENT_OTHER): Payer: Medicare Other | Admitting: Lab

## 2012-10-28 ENCOUNTER — Ambulatory Visit (HOSPITAL_BASED_OUTPATIENT_CLINIC_OR_DEPARTMENT_OTHER): Payer: Medicare Other

## 2012-10-28 VITALS — BP 134/77 | HR 82 | Temp 97.7°F | Resp 16

## 2012-10-28 DIAGNOSIS — Z5112 Encounter for antineoplastic immunotherapy: Secondary | ICD-10-CM

## 2012-10-28 DIAGNOSIS — C9000 Multiple myeloma not having achieved remission: Secondary | ICD-10-CM

## 2012-10-28 LAB — CBC WITH DIFFERENTIAL/PLATELET
Basophils Absolute: 0 10*3/uL (ref 0.0–0.1)
Eosinophils Absolute: 0.1 10*3/uL (ref 0.0–0.5)
HGB: 14.3 g/dL (ref 11.6–15.9)
MCV: 93.6 fL (ref 79.5–101.0)
MONO%: 7.8 % (ref 0.0–14.0)
NEUT#: 3.7 10*3/uL (ref 1.5–6.5)
RDW: 13.9 % (ref 11.2–14.5)
lymph#: 2.4 10*3/uL (ref 0.9–3.3)

## 2012-10-28 MED ORDER — ONDANSETRON HCL 8 MG PO TABS
8.0000 mg | ORAL_TABLET | Freq: Once | ORAL | Status: AC
  Administered 2012-10-28: 8 mg via ORAL

## 2012-10-28 MED ORDER — BORTEZOMIB CHEMO SQ INJECTION 3.5 MG (2.5MG/ML)
1.3000 mg/m2 | Freq: Once | INTRAMUSCULAR | Status: AC
  Administered 2012-10-28: 2 mg via SUBCUTANEOUS
  Filled 2012-10-28: qty 2

## 2012-10-28 NOTE — Patient Instructions (Addendum)
[facility] Discharge Instructions for Patients Receiving Chemotherapy  Today you received the following chemotherapy agents VELCADE    If you develop nausea and vomiting that is not controlled by your nausea medication, call the clinic. If it is after clinic hours your family physician or the after hours number for the clinic or go to the Emergency Department.   BELOW ARE SYMPTOMS THAT SHOULD BE REPORTED IMMEDIATELY:  *FEVER GREATER THAN 100.5 F  *CHILLS WITH OR WITHOUT FEVER  NAUSEA AND VOMITING THAT IS NOT CONTROLLED WITH YOUR NAUSEA MEDICATION  *UNUSUAL SHORTNESS OF BREATH  *UNUSUAL BRUISING OR BLEEDING  TENDERNESS IN MOUTH AND THROAT WITH OR WITHOUT PRESENCE OF ULCERS  *URINARY PROBLEMS  *BOWEL PROBLEMS  UNUSUAL RASH Items with * indicate a potential emergency and should be followed up as soon as possible.  One of the nurses will contact you 24 hours after your treatment. Please let the nurse know about any problems that you may have experienced. Feel free to call the clinic you have any questions or concerns. The clinic phone number is [phone].   I have been informed and understand all the instructions given to me. I know to contact the clinic, my physician, or go to the Emergency Department if any problems should occur. I do not have any questions at this time, but understand that I may call the clinic during office hours   should I have any questions or need assistance in obtaining follow up care.    __________________________________________  _____________  __________ Signature of Patient or Authorized Representative            Date                   Time    __________________________________________ Nurse's Signature

## 2012-11-11 ENCOUNTER — Ambulatory Visit (HOSPITAL_BASED_OUTPATIENT_CLINIC_OR_DEPARTMENT_OTHER): Payer: Medicare Other

## 2012-11-11 ENCOUNTER — Telehealth: Payer: Self-pay | Admitting: *Deleted

## 2012-11-11 ENCOUNTER — Telehealth: Payer: Self-pay | Admitting: Oncology

## 2012-11-11 ENCOUNTER — Other Ambulatory Visit (HOSPITAL_BASED_OUTPATIENT_CLINIC_OR_DEPARTMENT_OTHER): Payer: Medicare Other | Admitting: Lab

## 2012-11-11 ENCOUNTER — Ambulatory Visit (HOSPITAL_BASED_OUTPATIENT_CLINIC_OR_DEPARTMENT_OTHER): Payer: Medicare Other | Admitting: Oncology

## 2012-11-11 ENCOUNTER — Other Ambulatory Visit: Payer: Medicare Other | Admitting: Lab

## 2012-11-11 ENCOUNTER — Other Ambulatory Visit: Payer: Self-pay | Admitting: Oncology

## 2012-11-11 VITALS — BP 140/96 | HR 79 | Temp 98.0°F | Resp 18 | Ht 64.0 in | Wt 124.3 lb

## 2012-11-11 DIAGNOSIS — Z5112 Encounter for antineoplastic immunotherapy: Secondary | ICD-10-CM

## 2012-11-11 DIAGNOSIS — M549 Dorsalgia, unspecified: Secondary | ICD-10-CM

## 2012-11-11 DIAGNOSIS — C9000 Multiple myeloma not having achieved remission: Secondary | ICD-10-CM

## 2012-11-11 DIAGNOSIS — F329 Major depressive disorder, single episode, unspecified: Secondary | ICD-10-CM

## 2012-11-11 DIAGNOSIS — R5383 Other fatigue: Secondary | ICD-10-CM

## 2012-11-11 DIAGNOSIS — R5381 Other malaise: Secondary | ICD-10-CM

## 2012-11-11 LAB — COMPREHENSIVE METABOLIC PANEL (CC13)
CO2: 24 mEq/L (ref 22–29)
Calcium: 9.8 mg/dL (ref 8.4–10.4)
Chloride: 106 mEq/L (ref 98–107)
Creatinine: 0.8 mg/dL (ref 0.6–1.1)
Glucose: 97 mg/dl (ref 70–99)
Total Bilirubin: 0.52 mg/dL (ref 0.20–1.20)

## 2012-11-11 LAB — CBC WITH DIFFERENTIAL/PLATELET
Basophils Absolute: 0 10*3/uL (ref 0.0–0.1)
Eosinophils Absolute: 0.1 10*3/uL (ref 0.0–0.5)
HCT: 43 % (ref 34.8–46.6)
HGB: 14.9 g/dL (ref 11.6–15.9)
LYMPH%: 26.3 % (ref 14.0–49.7)
MONO#: 0.3 10*3/uL (ref 0.1–0.9)
NEUT#: 4.8 10*3/uL (ref 1.5–6.5)
NEUT%: 67.5 % (ref 38.4–76.8)
Platelets: 172 10*3/uL (ref 145–400)
WBC: 7.1 10*3/uL (ref 3.9–10.3)

## 2012-11-11 LAB — TSH: TSH: 1.2 u[IU]/mL (ref 0.350–4.500)

## 2012-11-11 MED ORDER — BORTEZOMIB CHEMO SQ INJECTION 3.5 MG (2.5MG/ML)
0.9750 mg/m2 | Freq: Once | INTRAMUSCULAR | Status: AC
  Administered 2012-11-11: 1.5 mg via SUBCUTANEOUS
  Filled 2012-11-11: qty 1.5

## 2012-11-11 MED ORDER — ONDANSETRON HCL 8 MG PO TABS
8.0000 mg | ORAL_TABLET | Freq: Once | ORAL | Status: AC
  Administered 2012-11-11: 8 mg via ORAL

## 2012-11-11 NOTE — Progress Notes (Signed)
[facility]  [phone] [phone]   OFFICE PROGRESS NOTE   Cc:  [name], MD  PAST DIAGNOSIS: multiple myeloma presented with bony disease and slight increase in her light chain. Initial diagnosis was in September 2007. She had presented initially with a plasmacytoma. Subsequently, she had developed multiple myeloma.   PAST THERAPY:  1. 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 She has received total of 45 Gy in 25 fractions in March 2008.  2. She received induction therapy with Revlimid and dexamethasone. Subsequently, received Velcade and dexamethasone until September 2009.  3. She received high dose chemotherapy and stem cell transplant in September 2009.  4. She had a complete response. She has been on active surveillance since that time.  5. 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 She received salvage chemo Velcade/Cytoxan/Dexamethasone on 05/05/11 with very good partial response. 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   CURRENT THERAPY: she started maintenance therapy with SQ Velcade weekly, 3 weeks on, 1 week off on Apr 01, 2012.   INTERVAL HISTORY: Kendra Johns 62 y.o. female returns for regular follow up by herself. She reports feeling tired the last few weeks.  She is still independent of activities of daily living. However, she needs several naps during the day.  She is afraid of driving long distance or go to church.  She has stable lower back and hip pain which requires her to take chronic pain meds.  Her pain med regimen has been stable without the need to increase the dose or frequency.   Patient denies fever, anorexia, weight loss, headache, visual changes, confusion, drenching night sweats, palpable lymph node swelling, mucositis, odynophagia, dysphagia, nausea vomiting, jaundice, chest  pain, palpitation, shortness of breath, dyspnea on exertion, productive cough, gum bleeding, epistaxis, hematemesis, hemoptysis, abdominal pain, abdominal swelling, early satiety, melena, hematochezia, hematuria, skin rash, spontaneous bleeding, heat or cold intolerance, bowel bladder incontinence, focal motor weakness, paresthesia, depression, suicidal or homicidal ideation, feeling hopelessness.    Past Medical History  Diagnosis Date  . Depression   . Hypertension   . Hepatitis C     genotype 1  . Diverticulosis of colon   . Multiple myeloma     kappa light chain, s/p high-dose chemo/stem cell tx - [facility]  . Hx of adenomatous colonic polyps 2007  . Anxiety   . Osteoarthritis   . Sleep apnea   . Hyperlipidemia   . GERD with stricture   . External hemorrhoids   . Hepatitis B virus infection   . IBS (irritable bowel syndrome)   . Helicobacter pylori gastritis 2001    ? if treated  . Sciatic nerve pain     with Dr. Delma Officer  . Chronic low back pain     Past Surgical History  Procedure Date  . Abdominal hysterectomy   . Oophorectomy   . Dilation and curettage of uterus   . Bone marrow transplant   . Appendectomy   . Colonoscopy w/ polypectomy 08/2006    1-2 adenomas, 6 polyps total, diverticulosis and external hemorrhoids  . Upper gastrointestinal endoscopy 08/2003    esophageal stricture dilation, hiatal hernia, gastrritis  . Cholecystectomy   . Tonsillectomy and adenoidectomy     Current Outpatient Prescriptions  Medication Sig Dispense Refill  . acyclovir (ZOVIRAX) 400 MG tablet Take 800 mg by  mouth 2 (two) times daily.       . Bisacodyl (LAXATIVE PO) Take by mouth. As needed      . diazepam (VALIUM) 5 MG tablet Take 1 tablet (5 mg total) by mouth as needed.  20 tablet  3  . Magnesium Hydroxide (MILK OF MAGNESIA PO) Take by mouth daily as needed.        Marland Kitchen morphine (MS CONTIN) 60 MG 12 hr tablet Take 60 mg by mouth every 8 (eight) hours.       Marland Kitchen morphine (MSIR) 15  MG tablet Take 15 mg by mouth every 4 (four) hours as needed. pain      . polyethylene glycol (MIRALAX / GLYCOLAX) packet Take 17 g by mouth every other day.        . prochlorperazine (COMPAZINE) 10 MG tablet Take 10 mg by mouth every 6 (six) hours as needed. nausea      . [DISCONTINUED] amLODipine (NORVASC) 5 MG tablet Take 1 tablet (5 mg total) by mouth daily.  90 tablet  3  . [DISCONTINUED] gabapentin (NEURONTIN) 600 MG tablet Take 600 mg by mouth 3 (three) times daily.        . [DISCONTINUED] lisinopril (PRINIVIL,ZESTRIL) 20 MG tablet Take 1 tablet (20 mg total) by mouth daily.  90 tablet  11    ALLERGIES:  is allergic to codeine and ibuprofen.  REVIEW OF SYSTEMS:  The rest of the 14-point review of system was negative.   Filed Vitals:   11/11/12 1351  BP: 140/96  Pulse: 79  Temp: 98 F (36.7 C)  Resp: 18   Wt Readings from Last 3 Encounters:  11/11/12 124 lb 4.8 oz (56.382 kg)  10/03/12 123 lb (55.792 kg)  09/19/12 121 lb (54.885 kg)   ECOG Performance status: 1-2 due to chronic hip pain.   PHYSICAL EXAMINATION:  General: thin-appearing woman, in no acute distress. Eyes: no scleral icterus. ENT: There were no oropharyngeal lesions. Neck was without thyromegaly. Lymphatics: Negative cervical, supraclavicular or axillary adenopathy. Respiratory: lungs were clear bilaterally without wheezing or crackles. Cardiovascular: Regular rate and rhythm, S1/S2, without murmur, rub or gallop. There was no pedal edema. GI: abdomen was soft, flat, nontender, nondistended, without organomegaly. Muscoloskeletal: no spinal tenderness of palpation of vertebral spine. She was able to get on and off the exam table by herself. There was no pain upon lying down.Skin exam was without echymosis, petichae. Neuro exam was nonfocal. Patient was able to get on and off exam table without assistance. Gait was normal. Patient was alerted and oriented. Attention was good. Language was appropriate. Mood was normal  without depression. Speech was not pressured. Thought content was not tangential.    LABORATORY/RADIOLOGY DATA:  Lab Results  Component Value Date   WBC 7.1 11/11/2012   HGB 14.9 11/11/2012   HCT 43.0 11/11/2012   PLT 172 11/11/2012   GLUCOSE 97 11/11/2012   CHOL 240* 02/03/2011   TRIG 93.0 02/03/2011   HDL 43.00 02/03/2011   LDLDIRECT 176.9 02/03/2011   LDLCALC  Value: 112        Total Cholesterol/HDL:CHD Risk Coronary Heart Disease Risk Table                     Men   Women  1/2 Average Risk   3.4   3.3* 11/26/2007   ALKPHOS 84 11/11/2012   ALT 72* 11/11/2012   AST 69* 11/11/2012   NA 138 11/11/2012   K 4.2 11/11/2012  CL 106 11/11/2012   CREATININE 0.8 11/11/2012   BUN 13.0 11/11/2012   CO2 24 11/11/2012   INR 1.0 03/12/2008   HGBA1C  Value: 5.8 (NOTE)   The ADA recommends the following therapeutic goals for glycemic   control related to Hgb A1C measurement:   Goal of Therapy:   < 7.0% Hgb A1C   Action Suggested:  > 8.0% Hgb A1C   Ref:  Diabetes Care, 22, Suppl. 1, 1999 11/26/2007    ASSESSMENT AND PLAN:   1. Multiple myeloma: She has been on maintenance Velcade with no sign of recurrence of disease.  We normally recommend 2 years of maintenance chemo.  However, she has been having grade 2 fatigue.  This is most likely due to Velcade.  I recommended decreasing dose of Velcade by 25%.  In the future, if her fatigue worsens, we may consider chemo holiday.  2. History of cirrhosis and hepatitis C: Diagnosed in 2009 with [facility] when she underwent the 1st transplant evaluation. She is compensated without encephalopathy, ascites, bleeding, cytopenia. She does not drink EtOH.  3. Prophylaxis: she is on acyclovir 400 mg p.o. b.i.d. to decrease her risk of herpes simplex virus reactivation while on Velcade.  4. Lower back pain:   Her pain is now back to baseline with MS Contin and Opana.   5. Hypertension: diet control.   6. Depression: Mood was stable today on Valium per PCP.  7. Follow up;  Weekly Velcade SQ (weekly, 3 weeks on, 1 week off) (lab once a month.  She does not need lab with every injection.  Her labs have been stable). Return visit in about 2 months with Belenda Cruise, NP and with me in 4 months.

## 2012-11-11 NOTE — Patient Instructions (Signed)
[facility] Discharge Instructions for Patients Receiving Chemotherapy  Today you received the following chemotherapy agents Velcade.  To help prevent nausea and vomiting after your treatment, we encourage you to take your nausea medication as prescribed.   If you develop nausea and vomiting that is not controlled by your nausea medication, call the clinic. If it is after clinic hours your family physician or the after hours number for the clinic or go to the Emergency Department.   BELOW ARE SYMPTOMS THAT SHOULD BE REPORTED IMMEDIATELY:  *FEVER GREATER THAN 100.5 F  *CHILLS WITH OR WITHOUT FEVER  NAUSEA AND VOMITING THAT IS NOT CONTROLLED WITH YOUR NAUSEA MEDICATION  *UNUSUAL SHORTNESS OF BREATH  *UNUSUAL BRUISING OR BLEEDING  TENDERNESS IN MOUTH AND THROAT WITH OR WITHOUT PRESENCE OF ULCERS  *URINARY PROBLEMS  *BOWEL PROBLEMS  UNUSUAL RASH Items with * indicate a potential emergency and should be followed up as soon as possible.  One of the nurses will contact you 24 hours after your treatment. Please let the nurse know about any problems that you may have experienced. Feel free to call the clinic you have any questions or concerns. The clinic phone number is [phone].   I have been informed and understand all the instructions given to me. I know to contact the clinic, my physician, or go to the Emergency Department if any problems should occur. I do not have any questions at this time, but understand that I may call the clinic during office hours   should I have any questions or need assistance in obtaining follow up care.    __________________________________________  _____________  __________ Signature of Patient or Authorized Representative            Date                   Time    __________________________________________ Nurse's Signature    

## 2012-11-11 NOTE — Telephone Encounter (Signed)
Gave pt appt for January and MArch, lab and ML, [employer] regarding chemo

## 2012-11-11 NOTE — Telephone Encounter (Signed)
Per staff message and POF I have scheduled appts.  JMW  

## 2012-11-15 ENCOUNTER — Telehealth: Payer: Self-pay | Admitting: Oncology

## 2012-11-15 LAB — PROTEIN ELECTROPHORESIS, SERUM
Albumin ELP: 60.4 % (ref 55.8–66.1)
Alpha-1-Globulin: 4.5 % (ref 2.9–4.9)
Alpha-2-Globulin: 12.6 % — ABNORMAL HIGH (ref 7.1–11.8)
Beta 2: 2.8 % — ABNORMAL LOW (ref 3.2–6.5)
Beta Globulin: 5.9 % (ref 4.7–7.2)
Gamma Globulin: 13.8 % (ref 11.1–18.8)

## 2012-11-15 NOTE — Telephone Encounter (Signed)
Called pt and left message regarding appt on January 17th , also advised patient to get appt calendar for February 2014

## 2012-11-16 ENCOUNTER — Encounter: Payer: Medicare Other | Admitting: Internal Medicine

## 2012-11-18 ENCOUNTER — Telehealth: Payer: Self-pay | Admitting: Oncology

## 2012-11-18 ENCOUNTER — Ambulatory Visit (HOSPITAL_BASED_OUTPATIENT_CLINIC_OR_DEPARTMENT_OTHER): Payer: Medicare Other

## 2012-11-18 VITALS — BP 125/86 | HR 82 | Temp 98.4°F | Resp 20

## 2012-11-18 DIAGNOSIS — Z5112 Encounter for antineoplastic immunotherapy: Secondary | ICD-10-CM

## 2012-11-18 DIAGNOSIS — C9000 Multiple myeloma not having achieved remission: Secondary | ICD-10-CM

## 2012-11-18 MED ORDER — ONDANSETRON HCL 8 MG PO TABS
8.0000 mg | ORAL_TABLET | Freq: Once | ORAL | Status: AC
  Administered 2012-11-18: 8 mg via ORAL

## 2012-11-18 MED ORDER — BORTEZOMIB CHEMO SQ INJECTION 3.5 MG (2.5MG/ML)
0.9750 mg/m2 | Freq: Once | INTRAMUSCULAR | Status: AC
  Administered 2012-11-18: 1.5 mg via SUBCUTANEOUS
  Filled 2012-11-18: qty 1.5

## 2012-11-18 NOTE — Patient Instructions (Addendum)
[facility] Discharge Instructions for Patients Receiving Chemotherapy  Today you received the following chemotherapy agents vidaza  To help prevent nausea and vomiting after your treatment, we encourage you to take your nausea medication   and take it as often as prescribed.   If you develop nausea and vomiting that is not controlled by your nausea medication, call the clinic. If it is after clinic hours your family physician or the after hours number for the clinic or go to the Emergency Department.   BELOW ARE SYMPTOMS THAT SHOULD BE REPORTED IMMEDIATELY:  *FEVER GREATER THAN 100.5 F  *CHILLS WITH OR WITHOUT FEVER  NAUSEA AND VOMITING THAT IS NOT CONTROLLED WITH YOUR NAUSEA MEDICATION  *UNUSUAL SHORTNESS OF BREATH  *UNUSUAL BRUISING OR BLEEDING  TENDERNESS IN MOUTH AND THROAT WITH OR WITHOUT PRESENCE OF ULCERS  *URINARY PROBLEMS  *BOWEL PROBLEMS  UNUSUAL RASH Items with * indicate a potential emergency and should be followed up as soon as possible.  One of the nurses will contact you 24 hours after your treatment. Please let the nurse know about any problems that you may have experienced. Feel free to call the clinic you have any questions or concerns. The clinic phone number is [phone].   I have been informed and understand all the instructions given to me. I know to contact the clinic, my physician, or go to the Emergency Department if any problems should occur. I do not have any questions at this time, but understand that I may call the clinic during office hours   should I have any questions or need assistance in obtaining follow up care.    __________________________________________  _____________  __________ Signature of Patient or Authorized Representative            Date                   Time    __________________________________________ Nurse's Signature    

## 2012-11-18 NOTE — Telephone Encounter (Signed)
S/w pt re next appts for 1/24 and 2/7. Pt will get schedule when she comes in today.

## 2012-11-18 NOTE — Telephone Encounter (Signed)
Gave pt appt for lab, Ml and chemo for January , February and March [id number]

## 2012-11-23 ENCOUNTER — Telehealth: Payer: Self-pay | Admitting: Oncology

## 2012-11-23 NOTE — Telephone Encounter (Signed)
lvm for pt regarding adding a lab b4 1.24.14 tx.Marland KitchenMarland KitchenMarland Kitchen

## 2012-11-25 ENCOUNTER — Other Ambulatory Visit: Payer: Medicare Other

## 2012-11-25 ENCOUNTER — Telehealth: Payer: Self-pay | Admitting: Oncology

## 2012-11-25 ENCOUNTER — Other Ambulatory Visit: Payer: Self-pay | Admitting: Oncology

## 2012-11-25 ENCOUNTER — Ambulatory Visit (HOSPITAL_BASED_OUTPATIENT_CLINIC_OR_DEPARTMENT_OTHER): Payer: Medicare Other

## 2012-11-25 ENCOUNTER — Telehealth: Payer: Self-pay | Admitting: *Deleted

## 2012-11-25 VITALS — BP 132/83 | HR 71 | Temp 98.3°F | Resp 14

## 2012-11-25 DIAGNOSIS — Z5112 Encounter for antineoplastic immunotherapy: Secondary | ICD-10-CM

## 2012-11-25 DIAGNOSIS — C9000 Multiple myeloma not having achieved remission: Secondary | ICD-10-CM

## 2012-11-25 MED ORDER — ONDANSETRON HCL 8 MG PO TABS
8.0000 mg | ORAL_TABLET | Freq: Once | ORAL | Status: AC
  Administered 2012-11-25: 8 mg via ORAL

## 2012-11-25 MED ORDER — BORTEZOMIB CHEMO SQ INJECTION 3.5 MG (2.5MG/ML)
0.9750 mg/m2 | Freq: Once | INTRAMUSCULAR | Status: AC
  Administered 2012-11-25: 1.5 mg via SUBCUTANEOUS
  Filled 2012-11-25: qty 1.5

## 2012-11-25 NOTE — Patient Instructions (Addendum)
[facility] Discharge Instructions for Patients Receiving Chemotherapy  Today you received the following chemotherapy agents velcade  If you develop nausea and vomiting that is not controlled by your nausea medication, call the clinic. If it is after clinic hours your family physician or the after hours number for the clinic or go to the Emergency Department.   BELOW ARE SYMPTOMS THAT SHOULD BE REPORTED IMMEDIATELY:  *FEVER GREATER THAN 100.5 F  *CHILLS WITH OR WITHOUT FEVER  NAUSEA AND VOMITING THAT IS NOT CONTROLLED WITH YOUR NAUSEA MEDICATION  *UNUSUAL SHORTNESS OF BREATH  *UNUSUAL BRUISING OR BLEEDING  TENDERNESS IN MOUTH AND THROAT WITH OR WITHOUT PRESENCE OF ULCERS  *URINARY PROBLEMS  *BOWEL PROBLEMS  UNUSUAL RASH Items with * indicate a potential emergency and should be followed up as soon as possible.  One of the nurses will contact you 24 hours after your treatment. Please let the nurse know about any problems that you may have experienced. Feel free to call the clinic you have any questions or concerns. The clinic phone number is [phone].   I have been informed and understand all the instructions given to me. I know to contact the clinic, my physician, or go to the Emergency Department if any problems should occur. I do not have any questions at this time, but understand that I may call the clinic during office hours   should I have any questions or need assistance in obtaining follow up care.    __________________________________________  _____________  __________ Signature of Patient or Authorized Representative            Date                   Time    __________________________________________ Nurse's Signature    

## 2012-11-25 NOTE — Telephone Encounter (Signed)
s/w pt and advised her that i cx her 2/7 lab per 1/24 pof     anne

## 2012-11-25 NOTE — Telephone Encounter (Signed)
Per chemo RN and old POF I have canceled lab appts.  JMW

## 2012-12-09 ENCOUNTER — Other Ambulatory Visit: Payer: Medicare Other | Admitting: Lab

## 2012-12-09 ENCOUNTER — Ambulatory Visit (HOSPITAL_BASED_OUTPATIENT_CLINIC_OR_DEPARTMENT_OTHER): Payer: Medicare Other

## 2012-12-09 ENCOUNTER — Ambulatory Visit (HOSPITAL_BASED_OUTPATIENT_CLINIC_OR_DEPARTMENT_OTHER): Payer: Medicare Other | Admitting: Lab

## 2012-12-09 ENCOUNTER — Other Ambulatory Visit: Payer: Self-pay | Admitting: Oncology

## 2012-12-09 VITALS — BP 118/83 | HR 79 | Temp 97.2°F | Resp 18

## 2012-12-09 DIAGNOSIS — Z5112 Encounter for antineoplastic immunotherapy: Secondary | ICD-10-CM

## 2012-12-09 DIAGNOSIS — C9000 Multiple myeloma not having achieved remission: Secondary | ICD-10-CM

## 2012-12-09 LAB — CBC WITH DIFFERENTIAL/PLATELET
BASO%: 0.9 % (ref 0.0–2.0)
Eosinophils Absolute: 0.2 10*3/uL (ref 0.0–0.5)
HCT: 42.4 % (ref 34.8–46.6)
LYMPH%: 30.2 % (ref 14.0–49.7)
MCHC: 34.7 g/dL (ref 31.5–36.0)
MCV: 93.8 fL (ref 79.5–101.0)
MONO#: 0.4 10*3/uL (ref 0.1–0.9)
MONO%: 7.6 % (ref 0.0–14.0)
NEUT%: 58.6 % (ref 38.4–76.8)
Platelets: 155 10*3/uL (ref 145–400)
WBC: 5.8 10*3/uL (ref 3.9–10.3)

## 2012-12-09 LAB — COMPREHENSIVE METABOLIC PANEL (CC13)
Alkaline Phosphatase: 80 U/L (ref 40–150)
CO2: 24 mEq/L (ref 22–29)
Creatinine: 0.8 mg/dL (ref 0.6–1.1)
Glucose: 94 mg/dl (ref 70–99)
Total Bilirubin: 0.38 mg/dL (ref 0.20–1.20)

## 2012-12-09 MED ORDER — ONDANSETRON HCL 8 MG PO TABS
8.0000 mg | ORAL_TABLET | Freq: Once | ORAL | Status: AC
  Administered 2012-12-09: 8 mg via ORAL

## 2012-12-09 MED ORDER — BORTEZOMIB CHEMO SQ INJECTION 3.5 MG (2.5MG/ML)
0.9750 mg/m2 | Freq: Once | INTRAMUSCULAR | Status: AC
  Administered 2012-12-09: 1.5 mg via SUBCUTANEOUS
  Filled 2012-12-09: qty 1.5

## 2012-12-09 NOTE — Patient Instructions (Addendum)
[facility] Discharge Instructions for Patients Receiving Chemotherapy  Today you received the following chemotherapy agents Velcade  To help prevent nausea and vomiting after your treatment, we encourage you to take your nausea medication as directed.   If you develop nausea and vomiting that is not controlled by your nausea medication, call the clinic. If it is after clinic hours your family physician or the after hours number for the clinic or go to the Emergency Department.   BELOW ARE SYMPTOMS THAT SHOULD BE REPORTED IMMEDIATELY:  *FEVER GREATER THAN 100.5 F  *CHILLS WITH OR WITHOUT FEVER  NAUSEA AND VOMITING THAT IS NOT CONTROLLED WITH YOUR NAUSEA MEDICATION  *UNUSUAL SHORTNESS OF BREATH  *UNUSUAL BRUISING OR BLEEDING  TENDERNESS IN MOUTH AND THROAT WITH OR WITHOUT PRESENCE OF ULCERS  *URINARY PROBLEMS  *BOWEL PROBLEMS  UNUSUAL RASH Items with * indicate a potential emergency and should be followed up as soon as possible.  One of the nurses will contact you 24 hours after your treatment. Please let the nurse know about any problems that you may have experienced. Feel free to call the clinic you have any questions or concerns. The clinic phone number is [phone].   I have been informed and understand all the instructions given to me. I know to contact the clinic, my physician, or go to the Emergency Department if any problems should occur. I do not have any questions at this time, but understand that I may call the clinic during office hours   should I have any questions or need assistance in obtaining follow up care.    __________________________________________  _____________  __________ Signature of Patient or Authorized Representative            Date                   Time    __________________________________________ Nurse's Signature    

## 2012-12-16 ENCOUNTER — Ambulatory Visit: Payer: Medicare Other

## 2012-12-16 ENCOUNTER — Other Ambulatory Visit: Payer: Medicare Other | Admitting: Lab

## 2012-12-21 ENCOUNTER — Other Ambulatory Visit (HOSPITAL_COMMUNITY): Payer: Self-pay | Admitting: Anesthesiology

## 2012-12-21 ENCOUNTER — Ambulatory Visit (HOSPITAL_COMMUNITY)
Admission: RE | Admit: 2012-12-21 | Discharge: 2012-12-21 | Disposition: A | Discharge status: Home / Self Care | Payer: Medicare Other | Source: Ambulatory Visit | Attending: Anesthesiology | Admitting: Anesthesiology

## 2012-12-21 DIAGNOSIS — M948X9 Other specified disorders of cartilage, unspecified sites: Secondary | ICD-10-CM | POA: Insufficient documentation

## 2012-12-21 DIAGNOSIS — M25551 Pain in right hip: Secondary | ICD-10-CM

## 2012-12-21 DIAGNOSIS — M25559 Pain in unspecified hip: Secondary | ICD-10-CM | POA: Insufficient documentation

## 2012-12-23 ENCOUNTER — Other Ambulatory Visit: Payer: Medicare Other | Admitting: Lab

## 2012-12-23 ENCOUNTER — Ambulatory Visit (HOSPITAL_BASED_OUTPATIENT_CLINIC_OR_DEPARTMENT_OTHER): Payer: Medicare Other

## 2012-12-23 VITALS — BP 127/77 | HR 78 | Temp 97.7°F | Resp 20

## 2012-12-23 DIAGNOSIS — C9000 Multiple myeloma not having achieved remission: Secondary | ICD-10-CM

## 2012-12-23 DIAGNOSIS — Z5112 Encounter for antineoplastic immunotherapy: Secondary | ICD-10-CM

## 2012-12-23 MED ORDER — BORTEZOMIB CHEMO SQ INJECTION 3.5 MG (2.5MG/ML)
0.9750 mg/m2 | Freq: Once | INTRAMUSCULAR | Status: AC
  Administered 2012-12-23: 1.5 mg via SUBCUTANEOUS
  Filled 2012-12-23: qty 1.5

## 2012-12-23 MED ORDER — ONDANSETRON HCL 8 MG PO TABS
8.0000 mg | ORAL_TABLET | Freq: Once | ORAL | Status: AC
  Administered 2012-12-23: 8 mg via ORAL

## 2012-12-23 NOTE — Patient Instructions (Signed)
[facility] Discharge Instructions for Patients Receiving Chemotherapy  Today you received the following chemotherapy agents Velcade  To help prevent nausea and vomiting after your treatment, we encourage you to take your nausea medication as directed.   If you develop nausea and vomiting that is not controlled by your nausea medication, call the clinic. If it is after clinic hours your family physician or the after hours number for the clinic or go to the Emergency Department.   BELOW ARE SYMPTOMS THAT SHOULD BE REPORTED IMMEDIATELY:  *FEVER GREATER THAN 100.5 F  *CHILLS WITH OR WITHOUT FEVER  NAUSEA AND VOMITING THAT IS NOT CONTROLLED WITH YOUR NAUSEA MEDICATION  *UNUSUAL SHORTNESS OF BREATH  *UNUSUAL BRUISING OR BLEEDING  TENDERNESS IN MOUTH AND THROAT WITH OR WITHOUT PRESENCE OF ULCERS  *URINARY PROBLEMS  *BOWEL PROBLEMS  UNUSUAL RASH Items with * indicate a potential emergency and should be followed up as soon as possible.  One of the nurses will contact you 24 hours after your treatment. Please let the nurse know about any problems that you may have experienced. Feel free to call the clinic you have any questions or concerns. The clinic phone number is [phone].   I have been informed and understand all the instructions given to me. I know to contact the clinic, my physician, or go to the Emergency Department if any problems should occur. I do not have any questions at this time, but understand that I may call the clinic during office hours   should I have any questions or need assistance in obtaining follow up care.    __________________________________________  _____________  __________ Signature of Patient or Authorized Representative            Date                   Time    __________________________________________ Nurse's Signature    

## 2013-01-06 ENCOUNTER — Encounter: Payer: Medicare Other | Admitting: Oncology

## 2013-01-06 ENCOUNTER — Ambulatory Visit: Payer: Medicare Other

## 2013-01-06 ENCOUNTER — Other Ambulatory Visit: Payer: Medicare Other | Admitting: Lab

## 2013-01-06 ENCOUNTER — Telehealth: Payer: Self-pay | Admitting: Oncology

## 2013-01-06 NOTE — Telephone Encounter (Signed)
S/w pt re next appt for 3/14.

## 2013-01-06 NOTE — Progress Notes (Signed)
This encounter was created in error - please disregard.

## 2013-01-11 ENCOUNTER — Other Ambulatory Visit: Payer: Self-pay | Admitting: Oncology

## 2013-01-13 ENCOUNTER — Other Ambulatory Visit (HOSPITAL_BASED_OUTPATIENT_CLINIC_OR_DEPARTMENT_OTHER): Payer: Medicare Other | Admitting: Lab

## 2013-01-13 ENCOUNTER — Ambulatory Visit (HOSPITAL_BASED_OUTPATIENT_CLINIC_OR_DEPARTMENT_OTHER): Payer: Medicare Other | Admitting: Oncology

## 2013-01-13 ENCOUNTER — Telehealth: Payer: Self-pay | Admitting: Oncology

## 2013-01-13 ENCOUNTER — Other Ambulatory Visit: Payer: Medicare Other | Admitting: Lab

## 2013-01-13 ENCOUNTER — Encounter: Payer: Self-pay | Admitting: Oncology

## 2013-01-13 ENCOUNTER — Ambulatory Visit (HOSPITAL_BASED_OUTPATIENT_CLINIC_OR_DEPARTMENT_OTHER): Payer: Medicare Other

## 2013-01-13 VITALS — Temp 97.8°F | Ht 64.0 in | Wt 125.8 lb

## 2013-01-13 DIAGNOSIS — M545 Low back pain: Secondary | ICD-10-CM

## 2013-01-13 DIAGNOSIS — C9000 Multiple myeloma not having achieved remission: Secondary | ICD-10-CM

## 2013-01-13 DIAGNOSIS — Z5112 Encounter for antineoplastic immunotherapy: Secondary | ICD-10-CM

## 2013-01-13 LAB — CBC WITH DIFFERENTIAL/PLATELET
Basophils Absolute: 0 10*3/uL (ref 0.0–0.1)
Eosinophils Absolute: 0.1 10*3/uL (ref 0.0–0.5)
HCT: 45.6 % (ref 34.8–46.6)
HGB: 15.7 g/dL (ref 11.6–15.9)
MONO#: 0.4 10*3/uL (ref 0.1–0.9)
NEUT#: 2.8 10*3/uL (ref 1.5–6.5)
NEUT%: 50.9 % (ref 38.4–76.8)
RDW: 13.6 % (ref 11.2–14.5)
lymph#: 2.2 10*3/uL (ref 0.9–3.3)

## 2013-01-13 LAB — COMPREHENSIVE METABOLIC PANEL (CC13)
AST: 56 U/L — ABNORMAL HIGH (ref 5–34)
Albumin: 3.7 g/dL (ref 3.5–5.0)
BUN: 12.8 mg/dL (ref 7.0–26.0)
Calcium: 9.5 mg/dL (ref 8.4–10.4)
Chloride: 108 mEq/L — ABNORMAL HIGH (ref 98–107)
Potassium: 3.9 mEq/L (ref 3.5–5.1)
Total Protein: 7.4 g/dL (ref 6.4–8.3)

## 2013-01-13 MED ORDER — BORTEZOMIB CHEMO SQ INJECTION 3.5 MG (2.5MG/ML)
0.9750 mg/m2 | Freq: Once | INTRAMUSCULAR | Status: AC
  Administered 2013-01-13: 1.5 mg via SUBCUTANEOUS
  Filled 2013-01-13: qty 1.5

## 2013-01-13 MED ORDER — ONDANSETRON HCL 8 MG PO TABS
8.0000 mg | ORAL_TABLET | Freq: Once | ORAL | Status: AC
  Administered 2013-01-13: 8 mg via ORAL

## 2013-01-13 NOTE — Telephone Encounter (Signed)
Spoke with patient regarding the following recommendations from Dr Gaylyn Rong:  Please advise her to continue observation and take pain medication for now. In the future, if her pain worsens, or if myeloma markers worsen, we may consider further workup. Thank you.

## 2013-01-13 NOTE — Telephone Encounter (Signed)
gv and printed appt schedule for pt for March, april and May....printed paper for [facility]

## 2013-01-13 NOTE — Patient Instructions (Addendum)
[facility] Discharge Instructions for Patients Receiving Chemotherapy  Today you received the following chemotherapy agents Velcade.  To help prevent nausea and vomiting after your treatment, we encourage you to take your nausea medication as prescribed.   If you develop nausea and vomiting that is not controlled by your nausea medication, call the clinic. If it is after clinic hours your family physician or the after hours number for the clinic or go to the Emergency Department.   BELOW ARE SYMPTOMS THAT SHOULD BE REPORTED IMMEDIATELY:  *FEVER GREATER THAN 100.5 F  *CHILLS WITH OR WITHOUT FEVER  NAUSEA AND VOMITING THAT IS NOT CONTROLLED WITH YOUR NAUSEA MEDICATION  *UNUSUAL SHORTNESS OF BREATH  *UNUSUAL BRUISING OR BLEEDING  TENDERNESS IN MOUTH AND THROAT WITH OR WITHOUT PRESENCE OF ULCERS  *URINARY PROBLEMS  *BOWEL PROBLEMS  UNUSUAL RASH Items with * indicate a potential emergency and should be followed up as soon as possible.  Feel free to call the clinic you have any questions or concerns. The clinic phone number is [phone].   I have been informed and understand all the instructions given to me. I know to contact the clinic, my physician, or go to the Emergency Department if any problems should occur. I do not have any questions at this time, but understand that I may call the clinic during office hours   should I have any questions or need assistance in obtaining follow up care.    __________________________________________  _____________  __________ Signature of Patient or Authorized Representative            Date                   Time    __________________________________________ Nurse's Signature    

## 2013-01-13 NOTE — Progress Notes (Signed)
[facility]  [phone] [phone]   OFFICE PROGRESS NOTE   Cc:  [name], MD  PAST DIAGNOSIS: multiple myeloma presented with bony disease and slight increase in her light chain. Initial diagnosis was in September 2007. She had presented initially with a plasmacytoma. Subsequently, she had developed multiple myeloma.   PAST THERAPY:  1. 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 She has received total of 45 Gy in 25 fractions in March 2008.  2. She received induction therapy with Revlimid and dexamethasone. Subsequently, received Velcade and dexamethasone until September 2009.  3. She received high dose chemotherapy and stem cell transplant in September 2009.  4. She had a complete response. She has been on active surveillance since that time.  5. 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 She received salvage chemo Velcade/Cytoxan/Dexamethasone on 05/05/11 with very good partial response. 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   CURRENT THERAPY: she started maintenance therapy with SQ Velcade weekly, 3 weeks on, 1 week off on Apr 01, 2012.   INTERVAL HISTORY: Kendra Johns 62 y.o. female returns for regular follow up by herself. States she is having more pain in her lower back/right hip. Pain radiates down her legs. MS Contin has been changed from 60 mg q 8 hours to 100 mg BID. Using MSIR for breakthrough pain. Had x-ray of right hip per pain management physican which demonstrated a stable lytic lesion in the right femur and a lytic lesion in the right superior pubic ramus. States pain was exacerbated by a plane trip to [place]. She is still independent of activities of daily living.  No difficulty walking or neuro changes.  Patient denies fever, anorexia, weight loss, headache, visual changes, confusion,  drenching night sweats, palpable lymph node swelling, mucositis, odynophagia, dysphagia, nausea vomiting, jaundice, chest pain, palpitation, shortness of breath, dyspnea on exertion, productive cough, gum bleeding, epistaxis, hematemesis, hemoptysis, abdominal pain, abdominal swelling, early satiety, melena, hematochezia, hematuria, skin rash, spontaneous bleeding, heat or cold intolerance, bowel bladder incontinence, focal motor weakness, paresthesia, depression, suicidal or homicidal ideation, feeling hopelessness.    Past Medical History  Diagnosis Date  . Depression   . Hypertension   . Hepatitis C     genotype 1  . Diverticulosis of colon   . Multiple myeloma     kappa light chain, s/p high-dose chemo/stem cell tx - [facility]  . Hx of adenomatous colonic polyps 2007  . Anxiety   . Osteoarthritis   . Sleep apnea   . Hyperlipidemia   . GERD with stricture   . External hemorrhoids   . Hepatitis B virus infection   . IBS (irritable bowel syndrome)   . Helicobacter pylori gastritis 2001    ? if treated  . Sciatic nerve pain     with Dr. Delma Officer  . Chronic low back pain     Past Surgical History  Procedure Laterality Date  . Abdominal hysterectomy    . Oophorectomy    . Dilation and curettage of uterus    . Bone marrow transplant    . Appendectomy    . Colonoscopy w/ polypectomy  08/2006    1-2 adenomas, 6 polyps total, diverticulosis and external hemorrhoids  . Upper gastrointestinal endoscopy  08/2003    esophageal stricture dilation, hiatal hernia, gastrritis  . Cholecystectomy    .  Tonsillectomy and adenoidectomy      Current Outpatient Prescriptions  Medication Sig Dispense Refill  . acyclovir (ZOVIRAX) 400 MG tablet Take 800 mg by mouth 2 (two) times daily.       . Bisacodyl (LAXATIVE PO) Take by mouth. As needed      . diazepam (VALIUM) 5 MG tablet Take 1 tablet (5 mg total) by mouth as needed.  20 tablet  3  . Magnesium Hydroxide (MILK OF MAGNESIA PO) Take by  mouth daily as needed.        Marland Kitchen morphine (MS CONTIN) 60 MG 12 hr tablet Take 60 mg by mouth every 8 (eight) hours.       Marland Kitchen morphine (MSIR) 15 MG tablet Take 15 mg by mouth every 4 (four) hours as needed. pain      . polyethylene glycol (MIRALAX / GLYCOLAX) packet Take 17 g by mouth every other day.        . prochlorperazine (COMPAZINE) 10 MG tablet Take 10 mg by mouth every 6 (six) hours as needed. nausea      . [DISCONTINUED] amLODipine (NORVASC) 5 MG tablet Take 1 tablet (5 mg total) by mouth daily.  90 tablet  3  . [DISCONTINUED] gabapentin (NEURONTIN) 600 MG tablet Take 600 mg by mouth 3 (three) times daily.        . [DISCONTINUED] lisinopril (PRINIVIL,ZESTRIL) 20 MG tablet Take 1 tablet (20 mg total) by mouth daily.  90 tablet  11   No current facility-administered medications for this visit.    ALLERGIES:  is allergic to codeine and ibuprofen.  REVIEW OF SYSTEMS:  The rest of the 14-point review of system was negative.   Filed Vitals:   01/13/13 0848  Temp: 97.8 F (36.6 C)   Wt Readings from Last 3 Encounters:  01/13/13 125 lb 12.8 oz (57.063 kg)  11/11/12 124 lb 4.8 oz (56.382 kg)  10/03/12 123 lb (55.792 kg)   ECOG Performance status: 1-2 due to chronic hip pain.   PHYSICAL EXAMINATION:  General: thin-appearing woman, in no acute distress. Eyes: no scleral icterus. ENT: There were no oropharyngeal lesions. Neck was without thyromegaly. Lymphatics: Negative cervical, supraclavicular or axillary adenopathy. Respiratory: lungs were clear bilaterally without wheezing or crackles. Cardiovascular: Regular rate and rhythm, S1/S2, without murmur, rub or gallop. There was no pedal edema. GI: abdomen was soft, flat, nontender, nondistended, without organomegaly. Muscoloskeletal: no spinal tenderness of palpation of vertebral spine. She was able to get on and off the exam table by herself. There was no pain upon lying down.Skin exam was without echymosis, petichae. Neuro exam was nonfocal.  Patient was able to get on and off exam table without assistance. Gait was normal. Patient was alerted and oriented. Attention was good. Language was appropriate. Mood was normal without depression. Speech was not pressured. Thought content was not tangential.    LABORATORY/RADIOLOGY DATA:  Lab Results  Component Value Date   WBC 5.6 01/13/2013   HGB 15.7 01/13/2013   HCT 45.6 01/13/2013   PLT 145 01/13/2013   GLUCOSE 135* 01/13/2013   CHOL 240* 02/03/2011   TRIG 93.0 02/03/2011   HDL 43.00 02/03/2011   LDLDIRECT 176.9 02/03/2011   LDLCALC  Value: 112        Total Cholesterol/HDL:CHD Risk Coronary Heart Disease Risk Table                     Men   Women  1/2 Average Risk   3.4  3.3* 11/26/2007   ALKPHOS 86 01/13/2013   ALT 59* 01/13/2013   AST 56* 01/13/2013   NA 138 01/13/2013   K 3.9 01/13/2013   CL 108* 01/13/2013   CREATININE 0.8 01/13/2013   BUN 12.8 01/13/2013   CO2 23 01/13/2013   INR 1.0 03/12/2008   HGBA1C  Value: 5.8 (NOTE)   The ADA recommends the following therapeutic goals for glycemic   control related to Hgb A1C measurement:   Goal of Therapy:   < 7.0% Hgb A1C   Action Suggested:  > 8.0% Hgb A1C   Ref:  Diabetes Care, 22, Suppl. 1, 1999 11/26/2007   IMAGING:  *RADIOLOGY REPORT*  Clinical Data: Right hip pain  RIGHT HIP - COMPLETE 2+ VIEW  Comparison: 01/14/2011  Findings: Three views of the right hip submitted. No acute  fracture or subluxation. Stable lytic lesion in proximal right  femur measures 1.5 cm. There is a lytic lesion in the right  superior pubic ramus measures 8.4 mm. Prior plasty noted in L5  vertebral body and right sacrum .  IMPRESSION:  No acute fracture or subluxation. Stable lytic lesion in proximal  right femur measures 1.5 cm. There is a lytic lesion in the right  superior pubic ramus measures 8.4 mm. P  Original Report Authenticated By: Natasha Mead, M.D.  ASSESSMENT AND PLAN:   1. Multiple myeloma: She has been on maintenance Velcade with no sign of  recurrence of disease.  We normally recommend 2 years of maintenance chemo.  However, she has been having grade 2 fatigue.  This is most likely due to Velcade. Velcade dose has been reduced by 25%; recommend that she proceed with Velcade without further dose modification.  M-spike and light chains were normal in January 2014; pending today. 2. History of cirrhosis and hepatitis C: Diagnosed in 2009 with [facility] when she underwent the 1st transplant evaluation. She is compensated without encephalopathy, ascites, bleeding, cytopenia. She does not drink EtOH.  3. Prophylaxis: she is on acyclovir 400 mg p.o. b.i.d. to decrease her risk of herpes simplex virus reactivation while on Velcade.  4. Lower back pain: Recently worsened. Pain medication changed by pain management MD. Cathlean Sauer shows lytic lesions. Review of previous MRI shows lytic lesions and I am unclear if these are new. Labs do not show any evidence of active myeloma at this time. I will review x-ray with Dr Gaylyn Rong upon his return to determine if further work-up is needed.   5. Hypertension: Recently restarted Norvasc.   6. Depression: Mood was stable today on Valium per PCP.  7. Follow up; Weekly Velcade SQ (weekly, 3 weeks on, 1 week off) (lab once a month.  She does not need lab with every injection.  Her labs have been stable). Return visit in about 2 months or sooner pending my discussion with Dr Gaylyn Rong regarding her back pain and x-ray results.

## 2013-01-17 ENCOUNTER — Ambulatory Visit: Payer: Medicare Other

## 2013-01-17 LAB — PROTEIN ELECTROPHORESIS, SERUM
Albumin ELP: 57.3 % (ref 55.8–66.1)
Alpha-1-Globulin: 4.9 % (ref 2.9–4.9)
Alpha-2-Globulin: 13.2 % — ABNORMAL HIGH (ref 7.1–11.8)
Beta 2: 2.9 % — ABNORMAL LOW (ref 3.2–6.5)
Beta Globulin: 6 % (ref 4.7–7.2)
Gamma Globulin: 15.7 % (ref 11.1–18.8)

## 2013-01-17 LAB — KAPPA/LAMBDA LIGHT CHAINS: Kappa:Lambda Ratio: 1.34 (ref 0.26–1.65)

## 2013-01-19 ENCOUNTER — Telehealth: Payer: Self-pay | Admitting: *Deleted

## 2013-01-19 NOTE — Telephone Encounter (Signed)
Pt left VM asks if Dr. Gaylyn Rong has had a chance to review her Radiology Report- a Back Xray, she gave to [place] last Friday.  She wants to know if Dr. Gaylyn Rong thinks she has any new lesions or progression of cancer in her back?  Belenda Cruise had said she would give report to Dr. Gaylyn Rong to review.

## 2013-01-20 ENCOUNTER — Other Ambulatory Visit: Payer: Self-pay | Admitting: Oncology

## 2013-01-20 ENCOUNTER — Other Ambulatory Visit: Payer: Medicare Other | Admitting: Lab

## 2013-01-20 ENCOUNTER — Ambulatory Visit (HOSPITAL_BASED_OUTPATIENT_CLINIC_OR_DEPARTMENT_OTHER): Payer: Medicare Other

## 2013-01-20 VITALS — BP 127/95 | HR 88 | Temp 98.5°F | Resp 17

## 2013-01-20 DIAGNOSIS — Z5112 Encounter for antineoplastic immunotherapy: Secondary | ICD-10-CM

## 2013-01-20 DIAGNOSIS — C9000 Multiple myeloma not having achieved remission: Secondary | ICD-10-CM

## 2013-01-20 MED ORDER — BORTEZOMIB CHEMO SQ INJECTION 3.5 MG (2.5MG/ML)
0.9750 mg/m2 | Freq: Once | INTRAMUSCULAR | Status: AC
  Administered 2013-01-20: 1.5 mg via SUBCUTANEOUS
  Filled 2013-01-20: qty 1.5

## 2013-01-20 MED ORDER — ONDANSETRON HCL 8 MG PO TABS
8.0000 mg | ORAL_TABLET | Freq: Once | ORAL | Status: AC
  Administered 2013-01-20: 8 mg via ORAL

## 2013-01-20 NOTE — Patient Instructions (Addendum)
[facility] Discharge Instructions for Patients Receiving Chemotherapy  Today you received the following chemotherapy agents Velcade.  To help prevent nausea and vomiting after your treatment, we encourage you to take your nausea medication as prescribed.   If you develop nausea and vomiting that is not controlled by your nausea medication, call the clinic. If it is after clinic hours your family physician or the after hours number for the clinic or go to the Emergency Department.   BELOW ARE SYMPTOMS THAT SHOULD BE REPORTED IMMEDIATELY:  *FEVER GREATER THAN 100.5 F  *CHILLS WITH OR WITHOUT FEVER  NAUSEA AND VOMITING THAT IS NOT CONTROLLED WITH YOUR NAUSEA MEDICATION  *UNUSUAL SHORTNESS OF BREATH  *UNUSUAL BRUISING OR BLEEDING  TENDERNESS IN MOUTH AND THROAT WITH OR WITHOUT PRESENCE OF ULCERS  *URINARY PROBLEMS  *BOWEL PROBLEMS  UNUSUAL RASH Items with * indicate a potential emergency and should be followed up as soon as possible.  Feel free to call the clinic you have any questions or concerns. The clinic phone number is [phone].   I have been informed and understand all the instructions given to me. I know to contact the clinic, my physician, or go to the Emergency Department if any problems should occur. I do not have any questions at this time, but understand that I may call the clinic during office hours   should I have any questions or need assistance in obtaining follow up care.    __________________________________________  _____________  __________ Signature of Patient or Authorized Representative            Date                   Time    __________________________________________ Nurse's Signature    

## 2013-01-20 NOTE — Telephone Encounter (Signed)
I personally reviewed the Xray on 12/21/12.  She has right lower back pain radiating to right leg.  Pain started after a trip to [place].  Her pain is now better.   Xray showed stable right femur lytic lesion.  There was also a right superior ramus lytic lesion of only 8mm.  This is not causing the pain.  There was nothing on the Xray to explain her right posterior hip pain.   Her pain is most likely sciatic in nature.  I recommended continuing pain med with Dr. Vear Clock.  I also recommended acupuncture.

## 2013-01-24 ENCOUNTER — Ambulatory Visit: Payer: Medicare Other

## 2013-01-26 ENCOUNTER — Encounter (HOSPITAL_COMMUNITY): Payer: Self-pay | Admitting: Dentistry

## 2013-01-26 ENCOUNTER — Ambulatory Visit (HOSPITAL_COMMUNITY): Payer: Self-pay | Admitting: Dentistry

## 2013-01-26 ENCOUNTER — Telehealth: Payer: Self-pay | Admitting: Oncology

## 2013-01-26 DIAGNOSIS — C9000 Multiple myeloma not having achieved remission: Secondary | ICD-10-CM

## 2013-01-26 DIAGNOSIS — K0530 Chronic periodontitis, unspecified: Secondary | ICD-10-CM

## 2013-01-26 DIAGNOSIS — K045 Chronic apical periodontitis: Secondary | ICD-10-CM

## 2013-01-26 DIAGNOSIS — Z0189 Encounter for other specified special examinations: Secondary | ICD-10-CM

## 2013-01-26 DIAGNOSIS — K036 Deposits [accretions] on teeth: Secondary | ICD-10-CM

## 2013-01-26 DIAGNOSIS — Z9229 Personal history of other drug therapy: Secondary | ICD-10-CM

## 2013-01-26 DIAGNOSIS — K029 Dental caries, unspecified: Secondary | ICD-10-CM

## 2013-01-26 DIAGNOSIS — K08109 Complete loss of teeth, unspecified cause, unspecified class: Secondary | ICD-10-CM

## 2013-01-26 DIAGNOSIS — M264 Malocclusion, unspecified: Secondary | ICD-10-CM

## 2013-01-26 DIAGNOSIS — K083 Retained dental root: Secondary | ICD-10-CM

## 2013-01-26 NOTE — Patient Instructions (Signed)
Patient to followup with her primary dentist for initial periodontal therapy, excavation of dental caries, possible referral for endodontic therapy with an endodontist, or consideration of referral to Dr. Vertell Limber at [facility] in [place], [place].  Dr. Kristin Bruins

## 2013-01-26 NOTE — Telephone Encounter (Signed)
Gave pt appt for March and April 2014 lab,, MD and chemo

## 2013-01-26 NOTE — Progress Notes (Signed)
DENTAL CONSULTATION  Date of Consultation:  01/26/2013 Patient Name:   Kendra Johns Date of Birth:   01/06/1951 Medical Record Number: [id number]  VITALS: BP 131/89  Pulse 76  Temp(Src) 98.2 F (36.8 C) (Oral)   CHIEF COMPLAINT: Patient referred for evaluation of poor dentition.  HPI: Kendra Johns is a 62 year old female with history of multiple myeloma and multiple doses of IV Zometa.  Patient with history of poor dentition. Patient referred for dental examination and to evaluate dental treatment needs and to assess the risk for osteonecrosis of the jaw with anticipated invasive dental procedures due to previous IV bisphosphonate therapy. Patient with current Velcade therapy, however, last Zometa was given in February of 2012.  Patient currently denies acute toothache, swellings, or abscesses. Patient was last seen by Dr. Ramiro Harvest for dental evaluation in February of 2014. Tentative plan was for possible dental extractions, dental restorations, and evaluation for replacement of missing teeth with immediate upper and possible lower partial denture.  Patient had previously been seen in 2009 and was referred to Dr. Chales Salmon for extraction of tooth #30. However, due to the potential risk for osteonecrosis of the jaw related to IV bisphosphonate therapy, patient was referred for root canal therapy of #30 instead. The root canal therapy was performed by an endodontist. Patient, however, never had a crown placed. Patient has not seen for regular periodontal therapy since 2002 or 2003.  PMH: Past Medical History  Diagnosis Date  . Depression   . Hypertension   . Hepatitis C     genotype 1  . Diverticulosis of colon   . Multiple myeloma     kappa light chain, s/p high-dose chemo/stem cell tx - [facility]  . Hx of adenomatous colonic polyps 2007  . Anxiety   . Osteoarthritis   . Sleep apnea   . Hyperlipidemia   . GERD with stricture   . External hemorrhoids   . Hepatitis B virus infection    . IBS (irritable bowel syndrome)   . Helicobacter pylori gastritis 2001    ? if treated  . Sciatic nerve pain     with Dr. Delma Officer  . Chronic low back pain     PSH: Past Surgical History  Procedure Laterality Date  . Abdominal hysterectomy    . Oophorectomy    . Dilation and curettage of uterus    . Bone marrow transplant    . Appendectomy    . Colonoscopy w/ polypectomy  08/2006    1-2 adenomas, 6 polyps total, diverticulosis and external hemorrhoids  . Upper gastrointestinal endoscopy  08/2003    esophageal stricture dilation, hiatal hernia, gastrritis  . Cholecystectomy    . Tonsillectomy and adenoidectomy      ALLERGIES: Allergies  Allergen Reactions  . Codeine   . Ibuprofen     REACTION: gi    MEDICATIONS: Current Outpatient Prescriptions  Medication Sig Dispense Refill  . acyclovir (ZOVIRAX) 400 MG tablet Take 800 mg by mouth 2 (two) times daily.       . Bisacodyl (LAXATIVE PO) Take by mouth. As needed      . diazepam (VALIUM) 5 MG tablet Take 1 tablet (5 mg total) by mouth as needed.  20 tablet  3  . Magnesium Hydroxide (MILK OF MAGNESIA PO) Take by mouth daily as needed.        Marland Kitchen morphine (MS CONTIN) 60 MG 12 hr tablet Take 60 mg by mouth every 8 (eight) hours.       Marland Kitchen  morphine (MSIR) 15 MG tablet Take 15 mg by mouth every 4 (four) hours as needed. pain      . polyethylene glycol (MIRALAX / GLYCOLAX) packet Take 17 g by mouth every other day.        . prochlorperazine (COMPAZINE) 10 MG tablet Take 10 mg by mouth every 6 (six) hours as needed. nausea      . [DISCONTINUED] amLODipine (NORVASC) 5 MG tablet Take 1 tablet (5 mg total) by mouth daily.  90 tablet  3  . [DISCONTINUED] gabapentin (NEURONTIN) 600 MG tablet Take 600 mg by mouth 3 (three) times daily.        . [DISCONTINUED] lisinopril (PRINIVIL,ZESTRIL) 20 MG tablet Take 1 tablet (20 mg total) by mouth daily.  90 tablet  11   No current facility-administered medications for this visit.     LABS: Lab Results  Component Value Date   WBC 5.6 01/13/2013   HGB 15.7 01/13/2013   HCT 45.6 01/13/2013   MCV 93.5 01/13/2013   PLT 145 01/13/2013      Component Value Date/Time   NA 138 01/13/2013 0828   NA 138 09/19/2012 1350   K 3.9 01/13/2013 0828   K 4.2 09/19/2012 1350   CL 108* 01/13/2013 0828   CL 103 09/19/2012 1350   CO2 23 01/13/2013 0828   CO2 27 09/19/2012 1350   GLUCOSE 135* 01/13/2013 0828   GLUCOSE 95 09/19/2012 1350   GLUCOSE 98 08/31/2006 1024   BUN 12.8 01/13/2013 0828   BUN 13 09/19/2012 1350   CREATININE 0.8 01/13/2013 0828   CREATININE 0.70 09/19/2012 1350   CALCIUM 9.5 01/13/2013 0828   CALCIUM 9.0 09/19/2012 1350   GFRNONAA >90 09/19/2012 1350   GFRAA >90 09/19/2012 1350   Lab Results  Component Value Date   INR 1.0 03/12/2008   INR 0.9 RATIO 01/05/2008   No results found for this basename: PTT    SOCIAL HISTORY: History   Social History  . Marital Status: Married    Spouse Name: N/A    Number of Children: N/A  . Years of Education: N/A   Occupational History  . Not on file.   Social History Main Topics  . Smoking status: Current Every Day Smoker -- 0.30 packs/day for 40 years    Types: Cigarettes  . Smokeless tobacco: Never Used  . Alcohol Use: No  . Drug Use: No  . Sexually Active: No   Other Topics Concern  . Not on file   Social History Narrative  . No narrative on file    FAMILY HISTORY: Family History  Problem Relation Age of Onset  . Alcohol abuse    . Lung cancer Father   . Leukemia Mother   . Cirrhosis Sister   . Colon cancer Neg Hx      REVIEW OF SYSTEMS: Reviewed with patient and included in dental consultation record.  DENTAL HISTORY: CHIEF COMPLAINT: Patient referred for evaluation of poor dentition.  HPI: Kendra Johns is a 62 year old female with history of multiple myeloma and multiple doses of IV Zometa.  Patient with history of poor dentition. Patient referred for dental examination and to evaluate  dental treatment needs and to assess the risk for osteonecrosis of the jaw with anticipated invasive dental procedures due to previous IV bisphosphonate therapy. Patient with current Velcade therapy, however, last Zometa was given in February of 2012.  Patient currently denies acute toothache, swellings, or abscesses. Patient was last seen by Dr. Ramiro Harvest for  dental evaluation in February of 2014. Tentative plan was for possible dental extractions, dental restorations, and evaluation for replacement of missing teeth with immediate upper and possible lower partial denture.  Patient had previously been seen in 2009 and was referred to Dr. Chales Salmon for extraction of tooth #30. However, due to the potential risk for osteonecrosis of the jaw related to IV bisphosphonate therapy, patient was referred for root canal therapy of #30 instead. The root canal therapy was performed by an endodontist. Patient, however, never had a crown placed. Patient has not seen for regular periodontal therapy since 2002 or 2003.  DENTAL EXAMINATION:  GENERAL: Patient is a well-developed, well-nourished female in no acute distress. HEAD AND NECK: There is no submandibular lymphadenopathy. The patient denies acute TMJ symptoms but has bilateral TMJ crepitus. INTRAORAL EXAM: Patient has incipient xerostomia. I do not see any evidence of abscess formation. DENTITION: There are multiple missing teeth numbers 1, 11, 13, 16, 18, 19, 29, and 32. Tooth numbers 10 and 14 and 30 are present as retained root segments. PERIODONTAL: The patient has chronic periodontitis with plaque and calculus accumulations, selective areas of gingival recession and incipient tooth mobility. DENTAL CARIES/SUBOPTIMAL RESTORATIONS: Multiple dental caries are noted as per dental charting form. ENDODONTIC: The patient has previous root canal therapy associated with the retained roots of tooth #30 and tooth #7. The root canal therapy of tooth #30 is now exposed to  the oral environment. Patient also has pedicle pathology and radiolucency associated with tooth numbers 4 and 14. CROWN AND BRIDGE: The patient has a crown on tooth #7. There are recurrent caries on the mesiolingual. PROSTHODONTIC: The patient denies presence of partial dentures. OCCLUSION: The patient has a poor occlusal scheme secondary to multiple missing teeth, supra-eruption and drifting of the unopposed teeth into the edentulous areas, and lack of replacement of missing teeth with dental prostheses.  RADIOGRAPHIC INTERPRETATION: An orthopantogram was obtained. This was supplemented with a full series of periapical radiographs. There multiple missing teeth. There multiple retained root segments. There multiple dental caries noted. There is periapical pathology associated with the apices of tooth numbers 4 and 14.  There is supra-eruption and drifting of the unopposed teeth into the edentulous areas. There is incipient to moderate bone loss.   ASSESSMENTS: 1. Chronic periodontitis with bone loss 2. Plaque and calculus accumulations 3. Gingival recession 4. Incipient tooth mobility 5. Rampant dental caries 6. Multiple retained root segments 7. Multiple areas of periapical pathology and radiolucency  8. Incipient xerostomia 9. Bilateral TMJ crepitus but no acute TMJ symptoms. 10. History of 24 doses of IV Zometa with the risk for osteonecrosis of the jaw with invasive dental procedures    PLAN/RECOMMENDATIONS: 1. I discussed the risks, benefits, and complications of various treatment options with the patient in relationship to the medical and dental conditions and potential risk for osteonecrosis of the jaw related to previous IV bisphosphonate therapy. We discussed various treatment options to include no treatment, multiple extractions with alveoloplasty, pre-prosthetic surgery as indicated, periodontal therapy, dental restorations, root canal therapy, crown and bridge therapy, implant  therapy, and replacement of missing teeth as indicated. The patient currently expresses understanding about potential risk for osteonecrosis of the jaw with extraction procedures. Patient will instead followup with her primary dentist -Dr. Rocco Serene initial periodontal therapy, and excavation of the dental caries, referral for root canal therapy if indicated, and discussion of replacement of missing teeth as indicated. Alternatively, the patient may consider referral to [facility]  for a second opinion concerning possible dental extractions with Dr. Vertell Limber.     2. Discussion of findings with medical team and dental team members and coordination of future medical and dental care as indicated.   Charlynne Pander, DDS

## 2013-01-27 ENCOUNTER — Ambulatory Visit (HOSPITAL_BASED_OUTPATIENT_CLINIC_OR_DEPARTMENT_OTHER): Payer: Medicare Other

## 2013-01-27 VITALS — BP 147/95 | HR 79 | Temp 98.7°F | Resp 19

## 2013-01-27 DIAGNOSIS — C9000 Multiple myeloma not having achieved remission: Secondary | ICD-10-CM

## 2013-01-27 DIAGNOSIS — Z5112 Encounter for antineoplastic immunotherapy: Secondary | ICD-10-CM

## 2013-01-27 MED ORDER — BORTEZOMIB CHEMO SQ INJECTION 3.5 MG (2.5MG/ML)
0.9750 mg/m2 | Freq: Once | INTRAMUSCULAR | Status: AC
  Administered 2013-01-27: 1.5 mg via SUBCUTANEOUS
  Filled 2013-01-27: qty 1.5

## 2013-01-27 MED ORDER — ONDANSETRON HCL 8 MG PO TABS
8.0000 mg | ORAL_TABLET | Freq: Once | ORAL | Status: AC
  Administered 2013-01-27: 8 mg via ORAL

## 2013-01-27 NOTE — Patient Instructions (Addendum)
[facility] Discharge Instructions for Patients Receiving Chemotherapy  Today you received the following chemotherapy agents Velcade.  To help prevent nausea and vomiting after your treatment, we encourage you to take your nausea medication as prescribed.   If you develop nausea and vomiting that is not controlled by your nausea medication, call the clinic. If it is after clinic hours your family physician or the after hours number for the clinic or go to the Emergency Department.   BELOW ARE SYMPTOMS THAT SHOULD BE REPORTED IMMEDIATELY:  *FEVER GREATER THAN 100.5 F  *CHILLS WITH OR WITHOUT FEVER  NAUSEA AND VOMITING THAT IS NOT CONTROLLED WITH YOUR NAUSEA MEDICATION  *UNUSUAL SHORTNESS OF BREATH  *UNUSUAL BRUISING OR BLEEDING  TENDERNESS IN MOUTH AND THROAT WITH OR WITHOUT PRESENCE OF ULCERS  *URINARY PROBLEMS  *BOWEL PROBLEMS  UNUSUAL RASH Items with * indicate a potential emergency and should be followed up as soon as possible.  Feel free to call the clinic you have any questions or concerns. The clinic phone number is [phone].   I have been informed and understand all the instructions given to me. I know to contact the clinic, my physician, or go to the Emergency Department if any problems should occur. I do not have any questions at this time, but understand that I may call the clinic during office hours   should I have any questions or need assistance in obtaining follow up care.    __________________________________________  _____________  __________ Signature of Patient or Authorized Representative            Date                   Time    __________________________________________ Nurse's Signature    

## 2013-02-01 ENCOUNTER — Ambulatory Visit: Payer: Medicare Other | Admitting: Internal Medicine

## 2013-02-02 ENCOUNTER — Other Ambulatory Visit: Payer: Self-pay | Admitting: Oncology

## 2013-02-10 ENCOUNTER — Ambulatory Visit (HOSPITAL_BASED_OUTPATIENT_CLINIC_OR_DEPARTMENT_OTHER): Payer: Medicare Other

## 2013-02-10 ENCOUNTER — Other Ambulatory Visit (HOSPITAL_BASED_OUTPATIENT_CLINIC_OR_DEPARTMENT_OTHER): Payer: Medicare Other | Admitting: Lab

## 2013-02-10 VITALS — BP 133/89 | HR 71 | Temp 98.0°F | Resp 18

## 2013-02-10 DIAGNOSIS — C9000 Multiple myeloma not having achieved remission: Secondary | ICD-10-CM

## 2013-02-10 DIAGNOSIS — Z5112 Encounter for antineoplastic immunotherapy: Secondary | ICD-10-CM

## 2013-02-10 LAB — COMPREHENSIVE METABOLIC PANEL (CC13)
ALT: 39 U/L (ref 0–55)
BUN: 13.1 mg/dL (ref 7.0–26.0)
CO2: 26 mEq/L (ref 22–29)
Creatinine: 0.8 mg/dL (ref 0.6–1.1)
Total Bilirubin: 0.39 mg/dL (ref 0.20–1.20)

## 2013-02-10 LAB — CBC WITH DIFFERENTIAL/PLATELET
BASO%: 0.8 % (ref 0.0–2.0)
Basophils Absolute: 0 10*3/uL (ref 0.0–0.1)
EOS%: 3.3 % (ref 0.0–7.0)
HCT: 43.8 % (ref 34.8–46.6)
LYMPH%: 36.8 % (ref 14.0–49.7)
MCH: 31.9 pg (ref 25.1–34.0)
MCHC: 34 g/dL (ref 31.5–36.0)
MONO#: 0.4 10*3/uL (ref 0.1–0.9)
NEUT%: 52.7 % (ref 38.4–76.8)
Platelets: 158 10*3/uL (ref 145–400)

## 2013-02-10 MED ORDER — ONDANSETRON HCL 8 MG PO TABS
8.0000 mg | ORAL_TABLET | Freq: Once | ORAL | Status: AC
  Administered 2013-02-10: 8 mg via ORAL

## 2013-02-10 MED ORDER — BORTEZOMIB CHEMO SQ INJECTION 3.5 MG (2.5MG/ML)
0.9750 mg/m2 | Freq: Once | INTRAMUSCULAR | Status: AC
  Administered 2013-02-10: 1.5 mg via SUBCUTANEOUS
  Filled 2013-02-10: qty 1.5

## 2013-02-10 NOTE — Patient Instructions (Addendum)

## 2013-02-17 ENCOUNTER — Ambulatory Visit: Payer: Self-pay | Admitting: Lab

## 2013-02-17 ENCOUNTER — Ambulatory Visit (HOSPITAL_BASED_OUTPATIENT_CLINIC_OR_DEPARTMENT_OTHER): Payer: Medicare Other

## 2013-02-17 VITALS — BP 130/82 | HR 66 | Temp 98.2°F | Resp 18

## 2013-02-17 DIAGNOSIS — C9000 Multiple myeloma not having achieved remission: Secondary | ICD-10-CM

## 2013-02-17 DIAGNOSIS — Z5112 Encounter for antineoplastic immunotherapy: Secondary | ICD-10-CM

## 2013-02-17 MED ORDER — ONDANSETRON HCL 8 MG PO TABS
8.0000 mg | ORAL_TABLET | Freq: Once | ORAL | Status: AC
  Administered 2013-02-17: 8 mg via ORAL

## 2013-02-17 MED ORDER — BORTEZOMIB CHEMO SQ INJECTION 3.5 MG (2.5MG/ML)
0.9750 mg/m2 | Freq: Once | INTRAMUSCULAR | Status: AC
  Administered 2013-02-17: 1.5 mg via SUBCUTANEOUS
  Filled 2013-02-17: qty 1.5

## 2013-02-17 NOTE — Patient Instructions (Signed)
[facility] Discharge Instructions for Patients Receiving Chemotherapy  Today you received the following chemotherapy agents :  Velcade.  To help prevent nausea and vomiting after your treatment, we encourage you to take your nausea medication as instructed by your physician.    If you develop nausea and vomiting that is not controlled by your nausea medication, call the clinic. If it is after clinic hours your family physician or the after hours number for the clinic or go to the Emergency Department.   BELOW ARE SYMPTOMS THAT SHOULD BE REPORTED IMMEDIATELY:  *FEVER GREATER THAN 100.5 F  *CHILLS WITH OR WITHOUT FEVER  NAUSEA AND VOMITING THAT IS NOT CONTROLLED WITH YOUR NAUSEA MEDICATION  *UNUSUAL SHORTNESS OF BREATH  *UNUSUAL BRUISING OR BLEEDING  TENDERNESS IN MOUTH AND THROAT WITH OR WITHOUT PRESENCE OF ULCERS  *URINARY PROBLEMS  *BOWEL PROBLEMS  UNUSUAL RASH Items with * indicate a potential emergency and should be followed up as soon as possible.  One of the nurses will contact you 24 hours after your treatment. Please let the nurse know about any problems that you may have experienced. Feel free to call the clinic you have any questions or concerns. The clinic phone number is [phone].   I have been informed and understand all the instructions given to me. I know to contact the clinic, my physician, or go to the Emergency Department if any problems should occur. I do not have any questions at this time, but understand that I may call the clinic during office hours   should I have any questions or need assistance in obtaining follow up care.    __________________________________________  _____________  __________ Signature of Patient or Authorized Representative            Date                   Time    __________________________________________ Nurse's Signature    

## 2013-02-22 ENCOUNTER — Telehealth (HOSPITAL_COMMUNITY): Payer: Self-pay | Admitting: Dental General Practice

## 2013-02-22 NOTE — Telephone Encounter (Signed)
Patient called to say she is having pain in tooth on the upper left side. Patient would like to have the tooth evaluated by an Endodontist for possible root canal therapy.. I faxed a copy of the dental consult and x-rays to Dr. Orvan Falconer, Halina Andreas, and Applebaum's office. Spoke with Arline Asp she will contact the patient for an appointment.Marland Kitchen  djs

## 2013-02-24 ENCOUNTER — Ambulatory Visit (HOSPITAL_BASED_OUTPATIENT_CLINIC_OR_DEPARTMENT_OTHER): Payer: Medicare Other

## 2013-02-24 VITALS — BP 146/100 | HR 77 | Temp 97.6°F

## 2013-02-24 DIAGNOSIS — Z5112 Encounter for antineoplastic immunotherapy: Secondary | ICD-10-CM

## 2013-02-24 DIAGNOSIS — C9000 Multiple myeloma not having achieved remission: Secondary | ICD-10-CM

## 2013-02-24 MED ORDER — ONDANSETRON HCL 8 MG PO TABS
8.0000 mg | ORAL_TABLET | Freq: Once | ORAL | Status: AC
  Administered 2013-02-24: 8 mg via ORAL

## 2013-02-24 MED ORDER — BORTEZOMIB CHEMO SQ INJECTION 3.5 MG (2.5MG/ML)
0.9750 mg/m2 | Freq: Once | INTRAMUSCULAR | Status: AC
  Administered 2013-02-24: 1.5 mg via SUBCUTANEOUS
  Filled 2013-02-24: qty 1.5

## 2013-02-24 NOTE — Progress Notes (Signed)
Patient appears fatigued - walking slowly.  Reports not taking high blood pressure medicine regularly.  Reports velcade "messing up" her teeth - falling out and can't be pulled.  Reports pain in her legs - "old bones"

## 2013-03-10 ENCOUNTER — Ambulatory Visit (HOSPITAL_BASED_OUTPATIENT_CLINIC_OR_DEPARTMENT_OTHER): Payer: Medicare Other

## 2013-03-10 ENCOUNTER — Other Ambulatory Visit (HOSPITAL_BASED_OUTPATIENT_CLINIC_OR_DEPARTMENT_OTHER): Payer: Medicare Other | Admitting: Lab

## 2013-03-10 ENCOUNTER — Telehealth: Payer: Self-pay | Admitting: Oncology

## 2013-03-10 ENCOUNTER — Ambulatory Visit (HOSPITAL_BASED_OUTPATIENT_CLINIC_OR_DEPARTMENT_OTHER): Payer: Medicare Other | Admitting: Oncology

## 2013-03-10 VITALS — BP 144/93 | HR 88 | Temp 98.3°F | Resp 20 | Ht 64.0 in | Wt 129.3 lb

## 2013-03-10 DIAGNOSIS — M549 Dorsalgia, unspecified: Secondary | ICD-10-CM

## 2013-03-10 DIAGNOSIS — C9000 Multiple myeloma not having achieved remission: Secondary | ICD-10-CM

## 2013-03-10 DIAGNOSIS — C9001 Multiple myeloma in remission: Secondary | ICD-10-CM

## 2013-03-10 DIAGNOSIS — R21 Rash and other nonspecific skin eruption: Secondary | ICD-10-CM

## 2013-03-10 DIAGNOSIS — Z5112 Encounter for antineoplastic immunotherapy: Secondary | ICD-10-CM

## 2013-03-10 LAB — COMPREHENSIVE METABOLIC PANEL (CC13)
CO2: 24 mEq/L (ref 22–29)
Creatinine: 0.8 mg/dL (ref 0.6–1.1)
Glucose: 111 mg/dl — ABNORMAL HIGH (ref 70–99)
Total Bilirubin: 0.5 mg/dL (ref 0.20–1.20)

## 2013-03-10 LAB — CBC WITH DIFFERENTIAL/PLATELET
Eosinophils Absolute: 0.2 10*3/uL (ref 0.0–0.5)
HCT: 45 % (ref 34.8–46.6)
LYMPH%: 27.4 % (ref 14.0–49.7)
MCHC: 32.6 g/dL (ref 31.5–36.0)
MONO#: 0.5 10*3/uL (ref 0.1–0.9)
NEUT#: 5.2 10*3/uL (ref 1.5–6.5)
NEUT%: 64.1 % (ref 38.4–76.8)
Platelets: 160 10*3/uL (ref 145–400)
WBC: 8.2 10*3/uL (ref 3.9–10.3)

## 2013-03-10 MED ORDER — BORTEZOMIB CHEMO SQ INJECTION 3.5 MG (2.5MG/ML)
0.9750 mg/m2 | Freq: Once | INTRAMUSCULAR | Status: AC
  Administered 2013-03-10: 1.5 mg via SUBCUTANEOUS
  Filled 2013-03-10: qty 1.5

## 2013-03-10 MED ORDER — ONDANSETRON HCL 8 MG PO TABS
8.0000 mg | ORAL_TABLET | Freq: Once | ORAL | Status: AC
  Administered 2013-03-10: 8 mg via ORAL

## 2013-03-10 NOTE — Telephone Encounter (Signed)
gv and printed appt sched and avs for pt for June ...emailed Dr. Gaylyn Rong to advise that pt nxt tx falls on 7.4.14 waiting for respons.Marland KitchenMarland Kitchenpt will pick up updated schedule at nxt visit...pt aware and ok

## 2013-03-10 NOTE — Patient Instructions (Addendum)
[facility] Discharge Inst[facility] for Patients Receiving Chemotherapy  Today you received the following chemotherapy agents Velcade.  To help prevent nausea and vomiting after your treatment, we encourage you to take your nausea medication as prescribed.   If you develop nausea and vomiting that is not controlled by your nausea medication, call the clinic. If it is after clinic hours your family physician or the after hours number for the clinic or go to the Emergency Department.   BELOW ARE SYMPTOMS THAT SHOULD BE REPORTED IMMEDIATELY:  *FEVER GREATER THAN 100.5 F  *CHILLS WITH OR WITHOUT FEVER  NAUSEA AND VOMITING THAT IS NOT CONTROLLED WITH YOUR NAUSEA MEDICATION  *UNUSUAL SHORTNESS OF BREATH  *UNUSUAL BRUISING OR BLEEDING  TENDERNESS IN MOUTH AND THROAT WITH OR WITHOUT PRESENCE OF ULCERS  *URINARY PROBLEMS  *BOWEL PROBLEMS  UNUSUAL RASH Items with * indicate a potential emergency and should be followed up as soon as possible.  Feel free to call the clinic you have any questions or concerns. The clinic phone number is [phone].   I have been informed and understand all the instructions given to me. I know to contact the clinic, my physician, or go to the Emergency Department if any problems should occur. I do not have any questions at this time, but understand that I may call the clinic during office hours   should I have any questions or need assistance in obtaining follow up care.    __________________________________________  _____________  __________ Signature of Patient or Authorized Representative            Date                   Time    __________________________________________ Nurse's Signature    

## 2013-03-10 NOTE — Progress Notes (Signed)
[facility]  [phone] [phone]   OFFICE PROGRESS NOTE   Cc:  [name], MD  PAST DIAGNOSIS: multiple myeloma presented with bony disease and slight increase in her light chain. Initial diagnosis was in September 2007. She had presented initially with a plasmacytoma. Subsequently, she had developed multiple myeloma.   PAST THERAPY:  1. 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 She has received total of 45 Gy in 25 fractions in March 2008.  2. She received induction therapy with Revlimid and dexamethasone. Subsequently, received Velcade and dexamethasone until September 2009.  3. She received high dose chemotherapy and stem cell transplant in September 2009.  4. She had a complete response. She has been on active surveillance since that time.  5. 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 She received salvage chemo Velcade/Cytoxan/Dexamethasone on 05/05/11 with very good partial response. 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   CURRENT THERAPY: she started maintenance therapy with SQ Velcade weekly, 3 weeks on, 1 week off on Apr 01, 2012.   INTERVAL HISTORY: Kendra Johns 62 y.o. female returns for regular follow up by herself. She is doing relatively well on Velcade. She still smokes cigarette. She has itching rash on the right 20 is intermittent. The rash resolved with taking Benadryl. Her low back pain is chronic and is stable. It is not as bad as when she first came back from [place]. She is able to work yard and ambulate and is independent of all activities of daily living. She denies hot flash, fever, night sweats, anorexia, weight loss, chest pain, cough, abdominal pain, bleeding symptom. Her dental condition is poor.  Dr. Kristin Bruins thought that she may need over upper teeth extracted. However she  received 2 year of Zometa; the last dose was more than a year ago. Despite the fact that was stopped more than a year ago, the fact that she received 2 years of Zometa was a concern for risk of osteonecrosis of the jaw. He sent the patient to [facility] dental school for second opinion.    Past Medical History  Diagnosis Date  . Depression   . Hypertension   . Hepatitis C     genotype 1  . Diverticulosis of colon   . Multiple myeloma     kappa light chain, s/p high-dose chemo/stem cell tx - [facility]  . Hx of adenomatous colonic polyps 2007  . Anxiety   . Osteoarthritis   . Sleep apnea   . Hyperlipidemia   . GERD with stricture   . External hemorrhoids   . Hepatitis B virus infection   . IBS (irritable bowel syndrome)   . Helicobacter pylori gastritis 2001    ? if treated  . Sciatic nerve pain     with Dr. Delma Officer  . Chronic low back pain     Past Surgical History  Procedure Laterality Date  . Abdominal hysterectomy    . Oophorectomy    . Dilation and curettage of uterus    . Bone marrow transplant      2009, 2013 [facility]  . Appendectomy    . Colonoscopy w/ polypectomy  08/2006    1-2 adenomas, 6 polyps total, diverticulosis and external hemorrhoids  . Upper gastrointestinal endoscopy  08/2003    esophageal stricture dilation, hiatal hernia, gastrritis  . Cholecystectomy    .  Tonsillectomy and adenoidectomy      Current Outpatient Prescriptions  Medication Sig Dispense Refill  . acyclovir (ZOVIRAX) 400 MG tablet Take 800 mg by mouth 2 (two) times daily.       . Bisacodyl (LAXATIVE PO) Take by mouth. As needed      . diazepam (VALIUM) 5 MG tablet Take 1 tablet (5 mg total) by mouth as needed.  20 tablet  3  . Magnesium Hydroxide (MILK OF MAGNESIA PO) Take by mouth daily as needed.        Marland Kitchen morphine (MS CONTIN) 100 MG 12 hr tablet Take 100 mg by mouth 2 (two) times daily. Take 1 tablet every 12 hours      . morphine (MSIR) 15 MG tablet Take 15 mg by mouth every 4 (four) hours  as needed. pain      . polyethylene glycol (MIRALAX / GLYCOLAX) packet Take 17 g by mouth every other day.        . prochlorperazine (COMPAZINE) 10 MG tablet Take 10 mg by mouth every 6 (six) hours as needed. nausea      . [DISCONTINUED] amLODipine (NORVASC) 5 MG tablet Take 1 tablet (5 mg total) by mouth daily.  90 tablet  3  . [DISCONTINUED] gabapentin (NEURONTIN) 600 MG tablet Take 600 mg by mouth 3 (three) times daily.        . [DISCONTINUED] lisinopril (PRINIVIL,ZESTRIL) 20 MG tablet Take 1 tablet (20 mg total) by mouth daily.  90 tablet  11   No current facility-administered medications for this visit.    ALLERGIES:  is allergic to codeine and ibuprofen.  REVIEW OF SYSTEMS:  The rest of the 14-point review of system was negative.   Filed Vitals:   03/10/13 1031  BP: 144/93  Pulse: 88  Temp: 98.3 F (36.8 C)  Resp: 20   Wt Readings from Last 3 Encounters:  03/10/13 129 lb 4.8 oz (58.65 kg)  01/13/13 125 lb 12.8 oz (57.063 kg)  11/11/12 124 lb 4.8 oz (56.382 kg)   ECOG Performance status: 1 due to chronic hip and back pain.   PHYSICAL EXAMINATION:  General: thin-appearing woman, in no acute distress. Eyes: no scleral icterus. ENT: There were no oropharyngeal lesions. Neck was without thyromegaly. Lymphatics: Negative cervical, supraclavicular or axillary adenopathy. Respiratory: lungs were clear bilaterally without wheezing or crackles. Cardiovascular: Regular rate and rhythm, S1/S2, without murmur, rub or gallop. There was no pedal edema. GI: abdomen was soft, flat, nontender, nondistended, without organomegaly. Muscoloskeletal: no spinal tenderness of palpation of vertebral spine. She was able to get on and off the exam table by herself. There was no pain upon lying down.Skin exam was without echymosis, petichae. Neuro exam was nonfocal. Patient was able to get on and off exam table without assistance. Gait was normal.   and oriented. Attention was good. Language was appropriate.  Mood was normal without depression. Speech was not pressured. Thought content was not tangential.    LABORATORY/RADIOLOGY DATA:  Lab Results  Component Value Date   WBC 8.2 03/10/2013   HGB 14.7 03/10/2013   HCT 45.0 03/10/2013   PLT 160 03/10/2013   GLUCOSE 111* 03/10/2013   CHOL 240* 02/03/2011   TRIG 93.0 02/03/2011   HDL 43.00 02/03/2011   LDLDIRECT 176.9 02/03/2011   LDLCALC  Value: 112        Total Cholesterol/HDL:CHD Risk Coronary Heart Disease Risk Table  Men   Women  1/2 Average Risk   3.4   3.3* 11/26/2007   ALKPHOS 90 03/10/2013   ALT 32 03/10/2013   AST 38* 03/10/2013   NA 140 03/10/2013   K 3.9 03/10/2013   CL 107 03/10/2013   CREATININE 0.8 03/10/2013   BUN 13.1 03/10/2013   CO2 24 03/10/2013   INR 1.0 03/12/2008   HGBA1C  Value: 5.8 (NOTE)   The ADA recommends the following therapeutic goals for glycemic   control related to Hgb A1C measurement:   Goal of Therapy:   < 7.0% Hgb A1C   Action Suggested:  > 8.0% Hgb A1C   Ref:  Diabetes Care, 22, Suppl. 1, 1999 11/26/2007    ASSESSMENT AND PLAN:   1. Multiple myeloma: Patient was alert  she is doing well on Velcade. She has grade 1 skin rash however only on the right lower extremity. It is not clear whether the rash is for sure due to Velcade. Her disease is in remission. I recommended her to continue maintenance Velcade for one more year to finish 2 years total of maintenance therapy.  2. History of cirrhosis and hepatitis C: Diagnosed in 2009 with [facility] when she underwent the 1st transplant evaluation. She is compensated without encephalopathy, ascites, bleeding, cytopenia. She does not drink EtOH.  3. Prophylaxis: she is on acyclovir 400 mg p.o. b.i.d. to decrease her risk of herpes simplex virus reactivation while on Velcade.  4. Lower back pain:   Her pain is now back to baseline with MS Contin and MSIR.   I advised her to consider acupuncture for pain management.  5. Hypertension: diet control.   6. Depression: Mood was  stable today on Valium per PCP.  7. Follow up; Weekly Velcade SQ (weekly, 3 weeks on, 1 week off) (lab once a month.  She does not need lab with every injection.  Her labs have been stable). Return visit in about 2 months with Belenda Cruise.  I informed Ms. Balcom that I'm leaving the practice in 2 months.  [facility] for her to see another provider when she returns.

## 2013-03-14 LAB — PROTEIN ELECTROPHORESIS, SERUM
Albumin ELP: 55 % — ABNORMAL LOW (ref 55.8–66.1)
Alpha-1-Globulin: 8.3 % — ABNORMAL HIGH (ref 2.9–4.9)
Alpha-2-Globulin: 12.7 % — ABNORMAL HIGH (ref 7.1–11.8)
Beta Globulin: 5.9 % (ref 4.7–7.2)
Total Protein, Serum Electrophoresis: 7.3 g/dL (ref 6.0–8.3)

## 2013-03-14 LAB — KAPPA/LAMBDA LIGHT CHAINS
Kappa free light chain: 3.45 mg/dL — ABNORMAL HIGH (ref 0.33–1.94)
Kappa:Lambda Ratio: 3 — ABNORMAL HIGH (ref 0.26–1.65)
Lambda Free Lght Chn: 1.15 mg/dL (ref 0.57–2.63)

## 2013-03-17 ENCOUNTER — Ambulatory Visit (HOSPITAL_BASED_OUTPATIENT_CLINIC_OR_DEPARTMENT_OTHER): Payer: Medicare Other

## 2013-03-17 DIAGNOSIS — C9000 Multiple myeloma not having achieved remission: Secondary | ICD-10-CM

## 2013-03-17 DIAGNOSIS — Z5112 Encounter for antineoplastic immunotherapy: Secondary | ICD-10-CM

## 2013-03-17 MED ORDER — ONDANSETRON HCL 8 MG PO TABS
8.0000 mg | ORAL_TABLET | Freq: Once | ORAL | Status: AC
  Administered 2013-03-17: 8 mg via ORAL

## 2013-03-17 MED ORDER — BORTEZOMIB CHEMO SQ INJECTION 3.5 MG (2.5MG/ML)
0.9750 mg/m2 | Freq: Once | INTRAMUSCULAR | Status: AC
  Administered 2013-03-17: 1.5 mg via SUBCUTANEOUS
  Filled 2013-03-17: qty 1.5

## 2013-03-24 ENCOUNTER — Ambulatory Visit (HOSPITAL_BASED_OUTPATIENT_CLINIC_OR_DEPARTMENT_OTHER): Payer: Medicare Other

## 2013-03-24 DIAGNOSIS — Z5112 Encounter for antineoplastic immunotherapy: Secondary | ICD-10-CM

## 2013-03-24 DIAGNOSIS — C9000 Multiple myeloma not having achieved remission: Secondary | ICD-10-CM

## 2013-03-24 MED ORDER — BORTEZOMIB CHEMO SQ INJECTION 3.5 MG (2.5MG/ML)
0.9750 mg/m2 | Freq: Once | INTRAMUSCULAR | Status: AC
  Administered 2013-03-24: 1.5 mg via SUBCUTANEOUS
  Filled 2013-03-24: qty 1.5

## 2013-03-24 MED ORDER — ONDANSETRON HCL 8 MG PO TABS
8.0000 mg | ORAL_TABLET | Freq: Once | ORAL | Status: AC
  Administered 2013-03-24: 8 mg via ORAL

## 2013-03-24 NOTE — Patient Instructions (Signed)
[facility] Discharge Instructions for Patients Receiving Chemotherapy  Today you received the following chemotherapy agents Velcade.  To help prevent nausea and vomiting after your treatment, we encourage you to take your nausea medication as prescribed.   If you develop nausea and vomiting that is not controlled by your nausea medication, call the clinic. If it is after clinic hours your family physician or the after hours number for the clinic or go to the Emergency Department.   BELOW ARE SYMPTOMS THAT SHOULD BE REPORTED IMMEDIATELY:  *FEVER GREATER THAN 100.5 F  *CHILLS WITH OR WITHOUT FEVER  NAUSEA AND VOMITING THAT IS NOT CONTROLLED WITH YOUR NAUSEA MEDICATION  *UNUSUAL SHORTNESS OF BREATH  *UNUSUAL BRUISING OR BLEEDING  TENDERNESS IN MOUTH AND THROAT WITH OR WITHOUT PRESENCE OF ULCERS  *URINARY PROBLEMS  *BOWEL PROBLEMS  UNUSUAL RASH Items with * indicate a potential emergency and should be followed up as soon as possible.  One of the nurses will contact you 24 hours after your treatment. Please let the nurse know about any problems that you may have experienced. Feel free to call the clinic you have any questions or concerns. The clinic phone number is [phone].    

## 2013-04-04 ENCOUNTER — Other Ambulatory Visit: Payer: Self-pay | Admitting: Lab

## 2013-04-04 ENCOUNTER — Ambulatory Visit: Payer: Self-pay | Admitting: Oncology

## 2013-04-07 ENCOUNTER — Ambulatory Visit (HOSPITAL_BASED_OUTPATIENT_CLINIC_OR_DEPARTMENT_OTHER): Payer: Medicare Other

## 2013-04-07 ENCOUNTER — Other Ambulatory Visit (HOSPITAL_BASED_OUTPATIENT_CLINIC_OR_DEPARTMENT_OTHER): Payer: Medicare Other | Admitting: Lab

## 2013-04-07 VITALS — BP 150/97 | HR 80 | Temp 98.5°F | Resp 18

## 2013-04-07 DIAGNOSIS — Z5112 Encounter for antineoplastic immunotherapy: Secondary | ICD-10-CM

## 2013-04-07 DIAGNOSIS — C9000 Multiple myeloma not having achieved remission: Secondary | ICD-10-CM

## 2013-04-07 LAB — CBC WITH DIFFERENTIAL/PLATELET
Basophils Absolute: 0 10*3/uL (ref 0.0–0.1)
Eosinophils Absolute: 0.1 10*3/uL (ref 0.0–0.5)
HGB: 15.4 g/dL (ref 11.6–15.9)
MCV: 93 fL (ref 79.5–101.0)
MONO%: 7.1 % (ref 0.0–14.0)
NEUT#: 3.9 10*3/uL (ref 1.5–6.5)
RDW: 13.3 % (ref 11.2–14.5)

## 2013-04-07 MED ORDER — ONDANSETRON HCL 8 MG PO TABS
8.0000 mg | ORAL_TABLET | Freq: Once | ORAL | Status: AC
  Administered 2013-04-07: 8 mg via ORAL

## 2013-04-07 MED ORDER — BORTEZOMIB CHEMO SQ INJECTION 3.5 MG (2.5MG/ML)
0.9750 mg/m2 | Freq: Once | INTRAMUSCULAR | Status: AC
  Administered 2013-04-07: 1.5 mg via SUBCUTANEOUS
  Filled 2013-04-07: qty 1.5

## 2013-04-07 NOTE — Patient Instructions (Addendum)
[facility] Discharge Instructions for Patients Receiving Chemotherapy  Today you received the following chemotherapy agents: Velcade. To help prevent nausea and vomiting after your treatment, we encourage you to take your nausea medication.  If you develop nausea and vomiting that is not controlled by your nausea medication, call the clinic.   BELOW ARE SYMPTOMS THAT SHOULD BE REPORTED IMMEDIATELY:  *FEVER GREATER THAN 100.5 F  *CHILLS WITH OR WITHOUT FEVER  NAUSEA AND VOMITING THAT IS NOT CONTROLLED WITH YOUR NAUSEA MEDICATION  *UNUSUAL SHORTNESS OF BREATH  *UNUSUAL BRUISING OR BLEEDING  TENDERNESS IN MOUTH AND THROAT WITH OR WITHOUT PRESENCE OF ULCERS  *URINARY PROBLEMS  *BOWEL PROBLEMS  UNUSUAL RASH Items with * indicate a potential emergency and should be followed up as soon as possible.  Feel free to call the clinic you have any questions or concerns. The clinic phone number is [phone].    

## 2013-04-14 ENCOUNTER — Ambulatory Visit (HOSPITAL_BASED_OUTPATIENT_CLINIC_OR_DEPARTMENT_OTHER): Payer: Medicare Other

## 2013-04-14 VITALS — BP 147/85 | HR 72 | Temp 98.6°F | Resp 18

## 2013-04-14 DIAGNOSIS — Z5112 Encounter for antineoplastic immunotherapy: Secondary | ICD-10-CM

## 2013-04-14 DIAGNOSIS — C9000 Multiple myeloma not having achieved remission: Secondary | ICD-10-CM

## 2013-04-14 MED ORDER — BORTEZOMIB CHEMO SQ INJECTION 3.5 MG (2.5MG/ML)
0.9750 mg/m2 | Freq: Once | INTRAMUSCULAR | Status: AC
  Administered 2013-04-14: 1.5 mg via SUBCUTANEOUS
  Filled 2013-04-14: qty 1.5

## 2013-04-14 MED ORDER — ONDANSETRON HCL 8 MG PO TABS
8.0000 mg | ORAL_TABLET | Freq: Once | ORAL | Status: AC
  Administered 2013-04-14: 8 mg via ORAL

## 2013-04-21 ENCOUNTER — Ambulatory Visit (HOSPITAL_BASED_OUTPATIENT_CLINIC_OR_DEPARTMENT_OTHER): Payer: Medicare Other

## 2013-04-21 VITALS — BP 107/76 | HR 73 | Temp 97.3°F | Resp 20

## 2013-04-21 DIAGNOSIS — C9000 Multiple myeloma not having achieved remission: Secondary | ICD-10-CM

## 2013-04-21 DIAGNOSIS — Z5112 Encounter for antineoplastic immunotherapy: Secondary | ICD-10-CM

## 2013-04-21 MED ORDER — ONDANSETRON HCL 8 MG PO TABS
8.0000 mg | ORAL_TABLET | Freq: Once | ORAL | Status: AC
  Administered 2013-04-21: 8 mg via ORAL

## 2013-04-21 MED ORDER — BORTEZOMIB CHEMO SQ INJECTION 3.5 MG (2.5MG/ML)
0.9750 mg/m2 | Freq: Once | INTRAMUSCULAR | Status: AC
  Administered 2013-04-21: 1.5 mg via SUBCUTANEOUS
  Filled 2013-04-21: qty 1.5

## 2013-04-21 NOTE — Patient Instructions (Addendum)
[facility] Discharge Instructions for Patients Receiving Chemotherapy  Today you received the following chemotherapy agents: Velcade.  To help prevent nausea and vomiting after your treatment, we encourage you to take your nausea medication as prescribed.   If you develop nausea and vomiting that is not controlled by your nausea medication, call the clinic.   BELOW ARE SYMPTOMS THAT SHOULD BE REPORTED IMMEDIATELY:  *FEVER GREATER THAN 100.5 F  *CHILLS WITH OR WITHOUT FEVER  NAUSEA AND VOMITING THAT IS NOT CONTROLLED WITH YOUR NAUSEA MEDICATION  *UNUSUAL SHORTNESS OF BREATH  *UNUSUAL BRUISING OR BLEEDING  TENDERNESS IN MOUTH AND THROAT WITH OR WITHOUT PRESENCE OF ULCERS  *URINARY PROBLEMS  *BOWEL PROBLEMS  UNUSUAL RASH Items with * indicate a potential emergency and should be followed up as soon as possible.  Feel free to call the clinic you have any questions or concerns. The clinic phone number is [phone].    

## 2013-05-04 ENCOUNTER — Ambulatory Visit (HOSPITAL_BASED_OUTPATIENT_CLINIC_OR_DEPARTMENT_OTHER): Payer: Medicare Other

## 2013-05-04 ENCOUNTER — Ambulatory Visit (HOSPITAL_BASED_OUTPATIENT_CLINIC_OR_DEPARTMENT_OTHER): Payer: Medicare Other | Admitting: Oncology

## 2013-05-04 ENCOUNTER — Encounter: Payer: Self-pay | Admitting: Oncology

## 2013-05-04 ENCOUNTER — Other Ambulatory Visit (HOSPITAL_BASED_OUTPATIENT_CLINIC_OR_DEPARTMENT_OTHER): Payer: Medicare Other | Admitting: Lab

## 2013-05-04 VITALS — BP 140/82 | HR 80 | Temp 98.3°F | Resp 18 | Ht 64.0 in | Wt 131.6 lb

## 2013-05-04 DIAGNOSIS — Z5112 Encounter for antineoplastic immunotherapy: Secondary | ICD-10-CM

## 2013-05-04 DIAGNOSIS — M545 Low back pain: Secondary | ICD-10-CM

## 2013-05-04 DIAGNOSIS — C9001 Multiple myeloma in remission: Secondary | ICD-10-CM

## 2013-05-04 DIAGNOSIS — C9000 Multiple myeloma not having achieved remission: Secondary | ICD-10-CM

## 2013-05-04 DIAGNOSIS — K7460 Unspecified cirrhosis of liver: Secondary | ICD-10-CM

## 2013-05-04 DIAGNOSIS — I10 Essential (primary) hypertension: Secondary | ICD-10-CM

## 2013-05-04 DIAGNOSIS — F329 Major depressive disorder, single episode, unspecified: Secondary | ICD-10-CM

## 2013-05-04 LAB — CBC WITH DIFFERENTIAL/PLATELET
BASO%: 0.5 % (ref 0.0–2.0)
Basophils Absolute: 0 10*3/uL (ref 0.0–0.1)
Eosinophils Absolute: 0.1 10*3/uL (ref 0.0–0.5)
HCT: 41.9 % (ref 34.8–46.6)
HGB: 14 g/dL (ref 11.6–15.9)
MCHC: 33.4 g/dL (ref 31.5–36.0)
MONO#: 0.4 10*3/uL (ref 0.1–0.9)
NEUT#: 2.9 10*3/uL (ref 1.5–6.5)
NEUT%: 45.1 % (ref 38.4–76.8)
WBC: 6.4 10*3/uL (ref 3.9–10.3)
lymph#: 2.9 10*3/uL (ref 0.9–3.3)

## 2013-05-04 LAB — COMPREHENSIVE METABOLIC PANEL (CC13)
AST: 52 U/L — ABNORMAL HIGH (ref 5–34)
Albumin: 3.5 g/dL (ref 3.5–5.0)
Alkaline Phosphatase: 78 U/L (ref 40–150)
Glucose: 150 mg/dl — ABNORMAL HIGH (ref 70–140)
Potassium: 4.1 mEq/L (ref 3.5–5.1)
Sodium: 137 mEq/L (ref 136–145)
Total Protein: 6.9 g/dL (ref 6.4–8.3)

## 2013-05-04 MED ORDER — BORTEZOMIB CHEMO SQ INJECTION 3.5 MG (2.5MG/ML)
0.9750 mg/m2 | Freq: Once | INTRAMUSCULAR | Status: AC
  Administered 2013-05-04: 1.5 mg via SUBCUTANEOUS
  Filled 2013-05-04: qty 1.5

## 2013-05-04 MED ORDER — ONDANSETRON HCL 8 MG PO TABS
8.0000 mg | ORAL_TABLET | Freq: Once | ORAL | Status: AC
  Administered 2013-05-04: 8 mg via ORAL

## 2013-05-04 NOTE — Progress Notes (Signed)
[facility]  [phone]) [phone] [phone]   OFFICE PROGRESS NOTE   Cc:  [name], MD  PAST DIAGNOSIS: multiple myeloma presented with bony disease and slight increase in her light chain. Initial diagnosis was in September 2007. She had presented initially with a plasmacytoma. Subsequently, she had developed multiple myeloma.   PAST THERAPY:  1. 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 She has received total of 45 Gy in 25 fractions in March 2008.  2. She received induction therapy with Revlimid and dexamethasone. Subsequently, received Velcade and dexamethasone until September 2009.  3. She received high dose chemotherapy and stem cell transplant in September 2009.  4. She had a complete response. She has been on active surveillance since that time.  5. 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 She received salvage chemo Velcade/Cytoxan/Dexamethasone on 05/05/11 with very good partial response. 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   CURRENT THERAPY: she started maintenance therapy with SQ Velcade weekly, 3 weeks on, 1 week off on Apr 01, 2012.   INTERVAL HISTORY: Kendra Johns 62 y.o. female returns for regular follow up by herself. She is doing relatively well on Velcade. She still smokes cigarettes. Her low back pain is chronic and is stable. She is able to work yard and ambulate and is independent of all activities of daily living. She denies hot flash, fever, night sweats, anorexia, weight loss, chest pain, cough, abdominal pain, bleeding symptom. Her dental condition is poor.  She plan to see a dentist at the [facility] dental school in the near future.    Past Medical History  Diagnosis Date  . Depression   . Hypertension   . Hepatitis C     genotype 1  . Diverticulosis of colon   . Multiple myeloma    kappa light chain, s/p high-dose chemo/stem cell tx - [facility]  . Hx of adenomatous colonic polyps 2007  . Anxiety   . Osteoarthritis   . Sleep apnea   . Hyperlipidemia   . GERD with stricture   . External hemorrhoids   . Hepatitis B virus infection   . IBS (irritable bowel syndrome)   . Helicobacter pylori gastritis 2001    ? if treated  . Sciatic nerve pain     with Dr. Delma Officer  . Chronic low back pain     Past Surgical History  Procedure Laterality Date  . Abdominal hysterectomy    . Oophorectomy    . Dilation and curettage of uterus    . Bone marrow transplant      2009, 2013 [facility]  . Appendectomy    . Colonoscopy w/ polypectomy  08/2006    1-2 adenomas, 6 polyps total, diverticulosis and external hemorrhoids  . Upper gastrointestinal endoscopy  08/2003    esophageal stricture dilation, hiatal hernia, gastrritis  . Cholecystectomy    . Tonsillectomy and adenoidectomy      Current Outpatient Prescriptions  Medication Sig Dispense Refill  . acyclovir (ZOVIRAX) 400 MG tablet Take 800 mg by mouth 2 (two) times daily.       . Bisacodyl (LAXATIVE PO) Take by mouth. As needed      . diazepam (VALIUM) 5 MG tablet Take 1 tablet (5 mg total) by mouth as needed.  20 tablet  3  . Magnesium Hydroxide (MILK OF MAGNESIA PO) Take  by mouth daily as needed.        Marland Kitchen morphine (MS CONTIN) 100 MG 12 hr tablet Take 100 mg by mouth 2 (two) times daily. Take 1 tablet every 12 hours      . morphine (MSIR) 15 MG tablet Take 15 mg by mouth every 4 (four) hours as needed. pain      . polyethylene glycol (MIRALAX / GLYCOLAX) packet Take 17 g by mouth every other day.        . prochlorperazine (COMPAZINE) 10 MG tablet Take 10 mg by mouth every 6 (six) hours as needed. nausea      . [DISCONTINUED] amLODipine (NORVASC) 5 MG tablet Take 1 tablet (5 mg total) by mouth daily.  90 tablet  3  . [DISCONTINUED] gabapentin (NEURONTIN) 600 MG tablet Take 600 mg by mouth 3 (three) times daily.        .  [DISCONTINUED] lisinopril (PRINIVIL,ZESTRIL) 20 MG tablet Take 1 tablet (20 mg total) by mouth daily.  90 tablet  11   No current facility-administered medications for this visit.    ALLERGIES:  is allergic to codeine and ibuprofen.  REVIEW OF SYSTEMS:  The rest of the 14-point review of system was negative.   Filed Vitals:   05/04/13 1002  BP: 140/82  Pulse: 80  Temp: 98.3 F (36.8 C)  Resp: 18   Wt Readings from Last 3 Encounters:  05/04/13 131 lb 9.6 oz (59.693 kg)  03/10/13 129 lb 4.8 oz (58.65 kg)  01/13/13 125 lb 12.8 oz (57.063 kg)   ECOG Performance status: 1 due to chronic hip and back pain.   PHYSICAL EXAMINATION:  General: thin-appearing woman, in no acute distress. Eyes: no scleral icterus. ENT: There were no oropharyngeal lesions. Neck was without thyromegaly. Lymphatics: Negative cervical, supraclavicular or axillary adenopathy. Respiratory: lungs were clear bilaterally without wheezing or crackles. Cardiovascular: Regular rate and rhythm, S1/S2, without murmur, rub or gallop. There was no pedal edema. GI: abdomen was soft, flat, nontender, nondistended, without organomegaly. Muscoloskeletal: no spinal tenderness of palpation of vertebral spine. She was able to get on and off the exam table by herself. There was no pain upon lying down.Skin exam was without echymosis, petichae. Neuro exam was nonfocal. Patient was able to get on and off exam table without assistance. Gait was normal.   and oriented. Attention was good. Language was appropriate. Mood was normal without depression. Speech was not pressured. Thought content was not tangential.    LABORATORY/RADIOLOGY DATA:  Lab Results  Component Value Date   WBC 6.4 05/04/2013   HGB 14.0 05/04/2013   HCT 41.9 05/04/2013   PLT 142* 05/04/2013   GLUCOSE 150* 05/04/2013   CHOL 240* 02/03/2011   TRIG 93.0 02/03/2011   HDL 43.00 02/03/2011   LDLDIRECT 176.9 02/03/2011   LDLCALC  Value: 112        Total Cholesterol/HDL:CHD Risk Coronary  Heart Disease Risk Table                     Men   Women  1/2 Average Risk   3.4   3.3* 11/26/2007   ALKPHOS 78 05/04/2013   ALT 54 05/04/2013   AST 52* 05/04/2013   NA 137 05/04/2013   K 4.1 05/04/2013   CL 107 03/10/2013   CREATININE 0.8 05/04/2013   BUN 19.3 05/04/2013   CO2 21* 05/04/2013   INR 1.0 03/12/2008   HGBA1C  Value: 5.8 (NOTE)   The ADA recommends the  following therapeutic goals for glycemic   control related to Hgb A1C measurement:   Goal of Therapy:   < 7.0% Hgb A1C   Action Suggested:  > 8.0% Hgb A1C   Ref:  Diabetes Care, 22, Suppl. 1, 1999 11/26/2007    ASSESSMENT AND PLAN:   1. Multiple myeloma: Patient was alert  she is doing well on Velcade. Her disease is in remission. I recommended her to continue maintenance Velcade to finish 2 years total of maintenance therapy.  2. History of cirrhosis and hepatitis C: Diagnosed in 2009 with [facility] when she underwent the 1st transplant evaluation. She is compensated without encephalopathy, ascites, bleeding, cytopenia. She does not drink EtOH.  3. Prophylaxis: she is on acyclovir 400 mg p.o. b.i.d. to decrease her risk of herpes simplex virus reactivation while on Velcade.  4. Lower back pain:   Her pain is now back to baseline with MS Contin and MSIR.   I advised her to consider acupuncture for pain management.  5. Hypertension: diet control.   6. Depression: Mood was stable today on Valium per PCP.  7. Follow up; Weekly Velcade SQ (weekly, 3 weeks on, 1 week off) (lab once a month.  She does not need lab with every injection.  Her labs have been stable). Return visit in about 2 months.

## 2013-05-04 NOTE — Patient Instructions (Signed)
[facility] Discharge Instructions for Patients Receiving Chemotherapy  Today you received the following chemotherapy agents: velcade  To help prevent nausea and vomiting after your treatment, we encourage you to take your nausea medication.  Take it as often as prescribed.     If you develop nausea and vomiting that is not controlled by your nausea medication, call the clinic. If it is after clinic hours your family physician or the after hours number for the clinic or go to the Emergency Department.   BELOW ARE SYMPTOMS THAT SHOULD BE REPORTED IMMEDIATELY:  *FEVER GREATER THAN 100.5 F  *CHILLS WITH OR WITHOUT FEVER  NAUSEA AND VOMITING THAT IS NOT CONTROLLED WITH YOUR NAUSEA MEDICATION  *UNUSUAL SHORTNESS OF BREATH  *UNUSUAL BRUISING OR BLEEDING  TENDERNESS IN MOUTH AND THROAT WITH OR WITHOUT PRESENCE OF ULCERS  *URINARY PROBLEMS  *BOWEL PROBLEMS  UNUSUAL RASH Items with * indicate a potential emergency and should be followed up as soon as possible.  Feel free to call the clinic you have any questions or concerns. The clinic phone number is [phone].   I have been informed and understand all the instructions given to me. I know to contact the clinic, my physician, or go to the Emergency Department if any problems should occur. I do not have any questions at this time, but understand that I may call the clinic during office hours   should I have any questions or need assistance in obtaining follow up care.    __________________________________________  _____________  __________ Signature of Patient or Authorized Representative            Date                   Time    __________________________________________ Nurse's Signature    

## 2013-05-08 ENCOUNTER — Telehealth: Payer: Self-pay | Admitting: Oncology

## 2013-05-08 ENCOUNTER — Telehealth: Payer: Self-pay | Admitting: *Deleted

## 2013-05-08 NOTE — Telephone Encounter (Signed)
Per staff message and POF I have scheduled appts.  JMW  

## 2013-05-08 NOTE — Telephone Encounter (Signed)
s.w. pt and advised on additional appt....pt ok and aware..will come and get updated sched at nxt visit

## 2013-05-09 LAB — PROTEIN ELECTROPHORESIS, SERUM
Alpha-1-Globulin: 5.6 % — ABNORMAL HIGH (ref 2.9–4.9)
Alpha-2-Globulin: 11.8 % (ref 7.1–11.8)
Gamma Globulin: 14.8 % (ref 11.1–18.8)

## 2013-05-09 LAB — KAPPA/LAMBDA LIGHT CHAINS: Kappa free light chain: 8 mg/dL — ABNORMAL HIGH (ref 0.33–1.94)

## 2013-05-12 ENCOUNTER — Ambulatory Visit (HOSPITAL_BASED_OUTPATIENT_CLINIC_OR_DEPARTMENT_OTHER): Payer: Medicare Other

## 2013-05-12 ENCOUNTER — Telehealth: Payer: Self-pay | Admitting: Oncology

## 2013-05-12 VITALS — BP 134/88 | HR 78 | Temp 98.2°F | Resp 16

## 2013-05-12 DIAGNOSIS — C9000 Multiple myeloma not having achieved remission: Secondary | ICD-10-CM

## 2013-05-12 DIAGNOSIS — Z5112 Encounter for antineoplastic immunotherapy: Secondary | ICD-10-CM

## 2013-05-12 MED ORDER — BORTEZOMIB CHEMO SQ INJECTION 3.5 MG (2.5MG/ML)
0.9750 mg/m2 | Freq: Once | INTRAMUSCULAR | Status: AC
  Administered 2013-05-12: 1.5 mg via SUBCUTANEOUS
  Filled 2013-05-12: qty 1.5

## 2013-05-12 MED ORDER — ONDANSETRON HCL 8 MG PO TABS
8.0000 mg | ORAL_TABLET | Freq: Once | ORAL | Status: AC
  Administered 2013-05-12: 8 mg via ORAL

## 2013-05-12 NOTE — Telephone Encounter (Signed)
Pt dropped off ins/disability papers to be completed today. Papers given to [facility] in managed care. Pt aware Karel Jarvis will let her know when forms are ready.

## 2013-05-12 NOTE — Patient Instructions (Addendum)
[facility] Discharge Inst[facility] for Patients Receiving Chemotherapy  Today you received the following chemotherapy agents: Velcade. To help prevent nausea and vomiting after your treatment, we encourage you to take your nausea medication.  If you develop nausea and vomiting that is not controlled by your nausea medication, call the clinic.   BELOW ARE SYMPTOMS THAT SHOULD BE REPORTED IMMEDIATELY:  *FEVER GREATER THAN 100.5 F  *CHILLS WITH OR WITHOUT FEVER  NAUSEA AND VOMITING THAT IS NOT CONTROLLED WITH YOUR NAUSEA MEDICATION  *UNUSUAL SHORTNESS OF BREATH  *UNUSUAL BRUISING OR BLEEDING  TENDERNESS IN MOUTH AND THROAT WITH OR WITHOUT PRESENCE OF ULCERS  *URINARY PROBLEMS  *BOWEL PROBLEMS  UNUSUAL RASH Items with * indicate a potential emergency and should be followed up as soon as possible.  Feel free to call the clinic you have any questions or concerns. The clinic phone number is [phone].    

## 2013-05-15 ENCOUNTER — Encounter: Payer: Self-pay | Admitting: Oncology

## 2013-05-15 NOTE — Progress Notes (Signed)
Put disability form on nurse's desk. °

## 2013-05-19 ENCOUNTER — Ambulatory Visit (HOSPITAL_BASED_OUTPATIENT_CLINIC_OR_DEPARTMENT_OTHER): Payer: Medicare Other

## 2013-05-19 VITALS — BP 138/93 | HR 81 | Temp 97.0°F | Resp 18

## 2013-05-19 DIAGNOSIS — C9000 Multiple myeloma not having achieved remission: Secondary | ICD-10-CM

## 2013-05-19 DIAGNOSIS — Z5112 Encounter for antineoplastic immunotherapy: Secondary | ICD-10-CM

## 2013-05-19 MED ORDER — BORTEZOMIB CHEMO SQ INJECTION 3.5 MG (2.5MG/ML)
0.9750 mg/m2 | Freq: Once | INTRAMUSCULAR | Status: AC
  Administered 2013-05-19: 1.5 mg via SUBCUTANEOUS
  Filled 2013-05-19: qty 1.5

## 2013-05-19 MED ORDER — ONDANSETRON HCL 8 MG PO TABS
8.0000 mg | ORAL_TABLET | Freq: Once | ORAL | Status: AC
  Administered 2013-05-19: 8 mg via ORAL

## 2013-05-19 NOTE — Patient Instructions (Signed)
BORTEZOMIB (bor TEZ oh mib) is a chemotherapy drug. It slows the growth of cancer cells. This medicine is used to treat multiple myeloma, lymphoma, and other cancers. This medicine may be used for other purposes; ask your health care provider or pharmacist if you have questions. What should I tell my health care provider before I take this medicine? They need to know if you have any of these conditions: -heart disease -irregular heartbeat -liver disease -low blood counts, like low white blood cells, platelets, or hemoglobin -peripheral neuropathy -taking medicine for blood pressure -an unusual or allergic reaction to bortezomib, mannitol, boron, other medicines, foods, dyes, or preservatives -pregnant or trying to get pregnant -breast-feeding How should I use this medicine? This medicine is for injection into a vein or for injection under the skin. It is given by a health care professional in a hospital or clinic setting. Talk to your pediatrician regarding the use of this medicine in children. Special care may be needed. Overdosage: If you think you have taken too much of this medicine contact a poison control center or emergency room at once. NOTE: This medicine is only for you. Do not share this medicine with others. What if I miss a dose? It is important not to miss your dose. Call your doctor or health care professional if you are unable to keep an appointment. What may interact with this medicine? -medicines for diabetes -medicines to increase blood counts like filgrastim, pegfilgrastim, sargramostim -zalcitabine Talk to your doctor or health care professional before taking any of these medicines: -acetaminophen -aspirin -ibuprofen -ketoprofen -naproxen This list may not describe all possible interactions. Give your health care provider a list of all the medicines, herbs, non-prescription drugs, or dietary supplements you use. Also tell them if you smoke, drink alcohol, or use  illegal drugs. Some items may interact with your medicine. What should I watch for while using this medicine? Visit your doctor for checks on your progress. This drug may make you feel generally unwell. This is not uncommon, as chemotherapy can affect healthy cells as well as cancer cells. Report any side effects. Continue your course of treatment even though you feel ill unless your doctor tells you to stop. You may get drowsy or dizzy. Do not drive, use machinery, or do anything that needs mental alertness until you know how this medicine affects you. Do not stand or sit up quickly, especially if you are an older patient. This reduces the risk of dizzy or fainting spells. In some cases, you may be given additional medicines to help with side effects. Follow all directions for their use. Call your doctor or health care professional for advice if you get a fever, chills or sore throat, or other symptoms of a cold or flu. Do not treat yourself. This drug decreases your body's ability to fight infections. Try to avoid being around people who are sick. This medicine may increase your risk to bruise or bleed. Call your doctor or health care professional if you notice any unusual bleeding. Be careful brushing and flossing your teeth or using a toothpick because you may get an infection or bleed more easily. If you have any dental work done, tell your dentist you are receiving this medicine. Avoid taking products that contain aspirin, acetaminophen, ibuprofen, naproxen, or ketoprofen unless instructed by your doctor. These medicines may hide a fever. Do not become pregnant while taking this medicine. Women should inform their doctor if they wish to become pregnant or think they might   be pregnant. There is a potential for serious side effects to an unborn child. Talk to your health care professional or pharmacist for more information. Do not breast-feed an infant while taking this medicine. You may have vomiting  or diarrhea while taking this medicine. Drink water or other fluids as directed. What side effects may I notice from receiving this medicine? Side effects that you should report to your doctor or health care professional as soon as possible: -allergic reactions like skin rash, itching or hives, swelling of the face, lips, or tongue -breathing problems -changes in hearing -changes in vision -fast, irregular heartbeat -feeling faint or lightheaded, falls -pain, tingling, numbness in the hands or feet -seizures -swelling of the ankles, feet, hands -unusual bleeding or bruising -unusually weak or tired -vomiting Side effects that usually do not require medical attention (report to your doctor or health care professional if they continue or are bothersome): -changes in emotions or moods -constipation -diarrhea -loss of appetite -headache -irritation at site where injected -nausea This list may not describe all possible side effects. Call your doctor for medical advice about side effects. You may report side effects to FDA at 1-800-FDA-1088. Where should I keep my medicine? This drug is given in a hospital or clinic and will not be stored at home. NOTE: This sheet is a summary. It may not cover all possible information. If you have questions about this medicine, talk to your doctor, pharmacist, or health care provider.  2012, Elsevier/Gold Standard. (11/26/2010 11:42:36 AM) 

## 2013-06-02 ENCOUNTER — Ambulatory Visit (HOSPITAL_BASED_OUTPATIENT_CLINIC_OR_DEPARTMENT_OTHER): Payer: Medicare Other

## 2013-06-02 ENCOUNTER — Other Ambulatory Visit (HOSPITAL_BASED_OUTPATIENT_CLINIC_OR_DEPARTMENT_OTHER): Payer: Medicare Other | Admitting: Lab

## 2013-06-02 VITALS — BP 150/88 | HR 78 | Temp 98.8°F | Resp 18

## 2013-06-02 DIAGNOSIS — C9001 Multiple myeloma in remission: Secondary | ICD-10-CM

## 2013-06-02 DIAGNOSIS — Z5112 Encounter for antineoplastic immunotherapy: Secondary | ICD-10-CM

## 2013-06-02 DIAGNOSIS — C9000 Multiple myeloma not having achieved remission: Secondary | ICD-10-CM

## 2013-06-02 LAB — CBC WITH DIFFERENTIAL/PLATELET
Eosinophils Absolute: 0.1 10*3/uL (ref 0.0–0.5)
HCT: 41.9 % (ref 34.8–46.6)
LYMPH%: 44.6 % (ref 14.0–49.7)
MONO#: 0.5 10*3/uL (ref 0.1–0.9)
NEUT#: 3.6 10*3/uL (ref 1.5–6.5)
Platelets: 148 10*3/uL (ref 145–400)
RBC: 4.5 10*6/uL (ref 3.70–5.45)
WBC: 7.7 10*3/uL (ref 3.9–10.3)

## 2013-06-02 MED ORDER — BORTEZOMIB CHEMO SQ INJECTION 3.5 MG (2.5MG/ML)
0.9750 mg/m2 | Freq: Once | INTRAMUSCULAR | Status: AC
  Administered 2013-06-02: 1.5 mg via SUBCUTANEOUS
  Filled 2013-06-02: qty 1.5

## 2013-06-02 MED ORDER — ONDANSETRON HCL 8 MG PO TABS
8.0000 mg | ORAL_TABLET | Freq: Once | ORAL | Status: AC
  Administered 2013-06-02: 8 mg via ORAL

## 2013-06-02 NOTE — Patient Instructions (Addendum)
[facility] Discharge Instructions for Patients Receiving Chemotherapy  Today you received the following chemotherapy agents: velcade  To help prevent nausea and vomiting after your treatment, we encourage you to take your nausea medication.  Take it as often as prescribed.     If you develop nausea and vomiting that is not controlled by your nausea medication, call the clinic. If it is after clinic hours your family physician or the after hours number for the clinic or go to the Emergency Department.   BELOW ARE SYMPTOMS THAT SHOULD BE REPORTED IMMEDIATELY:  *FEVER GREATER THAN 100.5 F  *CHILLS WITH OR WITHOUT FEVER  NAUSEA AND VOMITING THAT IS NOT CONTROLLED WITH YOUR NAUSEA MEDICATION  *UNUSUAL SHORTNESS OF BREATH  *UNUSUAL BRUISING OR BLEEDING  TENDERNESS IN MOUTH AND THROAT WITH OR WITHOUT PRESENCE OF ULCERS  *URINARY PROBLEMS  *BOWEL PROBLEMS  UNUSUAL RASH Items with * indicate a potential emergency and should be followed up as soon as possible.  Feel free to call the clinic you have any questions or concerns. The clinic phone number is [phone].   I have been informed and understand all the instructions given to me. I know to contact the clinic, my physician, or go to the Emergency Department if any problems should occur. I do not have any questions at this time, but understand that I may call the clinic during office hours   should I have any questions or need assistance in obtaining follow up care.    __________________________________________  _____________  __________ Signature of Patient or Authorized Representative            Date                   Time    __________________________________________ Nurse's Signature    

## 2013-06-09 ENCOUNTER — Ambulatory Visit (INDEPENDENT_AMBULATORY_CARE_PROVIDER_SITE_OTHER): Payer: Medicare Other | Admitting: Family Medicine

## 2013-06-09 ENCOUNTER — Encounter: Payer: Self-pay | Admitting: Family Medicine

## 2013-06-09 ENCOUNTER — Ambulatory Visit: Payer: Self-pay

## 2013-06-09 VITALS — BP 154/94 | Temp 99.7°F | Wt 130.0 lb

## 2013-06-09 DIAGNOSIS — J209 Acute bronchitis, unspecified: Secondary | ICD-10-CM

## 2013-06-09 MED ORDER — AZITHROMYCIN 250 MG PO TABS: ORAL_TABLET | ORAL | Status: DC

## 2013-06-09 MED ORDER — ALBUTEROL SULFATE HFA 108 (90 BASE) MCG/ACT IN AERS: 2.0000 | INHALATION_SPRAY | RESPIRATORY_TRACT | Status: DC | PRN

## 2013-06-16 ENCOUNTER — Ambulatory Visit (HOSPITAL_BASED_OUTPATIENT_CLINIC_OR_DEPARTMENT_OTHER): Payer: Medicare Other

## 2013-06-16 VITALS — BP 124/89 | HR 86 | Temp 97.2°F | Resp 16

## 2013-06-16 DIAGNOSIS — C9000 Multiple myeloma not having achieved remission: Secondary | ICD-10-CM

## 2013-06-16 DIAGNOSIS — Z5112 Encounter for antineoplastic immunotherapy: Secondary | ICD-10-CM

## 2013-06-16 MED ORDER — BORTEZOMIB CHEMO SQ INJECTION 3.5 MG (2.5MG/ML)
0.9750 mg/m2 | Freq: Once | INTRAMUSCULAR | Status: AC
  Administered 2013-06-16: 1.5 mg via SUBCUTANEOUS
  Filled 2013-06-16: qty 1.5

## 2013-06-16 MED ORDER — ONDANSETRON HCL 8 MG PO TABS
8.0000 mg | ORAL_TABLET | Freq: Once | ORAL | Status: AC
  Administered 2013-06-16: 8 mg via ORAL

## 2013-06-18 ENCOUNTER — Encounter: Payer: Self-pay | Admitting: Family Medicine

## 2013-06-18 NOTE — Progress Notes (Signed)
  Subjective:    Patient ID: Kendra Johns, female    DOB: 07/10/1951, 62 y.o.   MRN: [id number]  HPI Here for one week of chest congestion and coughing up green sputum. No chest pain or fever.    Review of Systems  Constitutional: Negative.   HENT: Negative.   Eyes: Negative.   Respiratory: Positive for cough, chest tightness and shortness of breath.   Cardiovascular: Negative.        Objective:   Physical Exam  Constitutional: She appears well-developed and well-nourished.  HENT:  Right Ear: External ear normal.  Left Ear: External ear normal.  Nose: Nose normal.  Mouth/Throat: Oropharynx is clear and moist.  Eyes: Conjunctivae are normal.  Pulmonary/Chest: Effort normal and breath sounds normal. No respiratory distress. She has no wheezes. She has no rales.  Lymphadenopathy:    She has no cervical adenopathy.          Assessment & Plan:  Add Mucinex prn.

## 2013-06-30 ENCOUNTER — Other Ambulatory Visit (HOSPITAL_BASED_OUTPATIENT_CLINIC_OR_DEPARTMENT_OTHER): Payer: Medicare Other | Admitting: Lab

## 2013-06-30 ENCOUNTER — Other Ambulatory Visit: Payer: Self-pay

## 2013-06-30 ENCOUNTER — Ambulatory Visit (HOSPITAL_BASED_OUTPATIENT_CLINIC_OR_DEPARTMENT_OTHER): Payer: Medicare Other | Admitting: Hematology and Oncology

## 2013-06-30 ENCOUNTER — Ambulatory Visit (HOSPITAL_BASED_OUTPATIENT_CLINIC_OR_DEPARTMENT_OTHER): Payer: Medicare Other

## 2013-06-30 VITALS — BP 127/90 | HR 86 | Temp 97.9°F | Resp 18 | Ht 64.0 in | Wt 130.8 lb

## 2013-06-30 DIAGNOSIS — C9000 Multiple myeloma not having achieved remission: Secondary | ICD-10-CM

## 2013-06-30 DIAGNOSIS — B182 Chronic viral hepatitis C: Secondary | ICD-10-CM

## 2013-06-30 DIAGNOSIS — Z5112 Encounter for antineoplastic immunotherapy: Secondary | ICD-10-CM

## 2013-06-30 DIAGNOSIS — C9002 Multiple myeloma in relapse: Secondary | ICD-10-CM

## 2013-06-30 LAB — COMPREHENSIVE METABOLIC PANEL (CC13)
AST: 33 U/L (ref 5–34)
Alkaline Phosphatase: 79 U/L (ref 40–150)
BUN: 12.7 mg/dL (ref 7.0–26.0)
Creatinine: 0.8 mg/dL (ref 0.6–1.1)

## 2013-06-30 LAB — CBC WITH DIFFERENTIAL/PLATELET
Basophils Absolute: 0.1 10*3/uL (ref 0.0–0.1)
EOS%: 1.9 % (ref 0.0–7.0)
HCT: 41.8 % (ref 34.8–46.6)
HGB: 14.3 g/dL (ref 11.6–15.9)
MCH: 32.1 pg (ref 25.1–34.0)
MCV: 93.6 fL (ref 79.5–101.0)
MONO%: 4.6 % (ref 0.0–14.0)
NEUT%: 52.1 % (ref 38.4–76.8)

## 2013-06-30 MED ORDER — BORTEZOMIB CHEMO SQ INJECTION 3.5 MG (2.5MG/ML)
0.9750 mg/m2 | Freq: Once | INTRAMUSCULAR | Status: AC
  Administered 2013-06-30: 1.5 mg via SUBCUTANEOUS
  Filled 2013-06-30: qty 1.5

## 2013-06-30 MED ORDER — ONDANSETRON HCL 8 MG PO TABS
8.0000 mg | ORAL_TABLET | Freq: Once | ORAL | Status: AC
  Administered 2013-06-30: 8 mg via ORAL

## 2013-06-30 NOTE — Patient Instructions (Signed)
[facility] Discharge Instructions for Patients Receiving Chemotherapy  Today you received the following chemotherapy agent Velcade injection.  To help prevent nausea and vomiting after your treatment, we encourage you to take your nausea medication.   If you develop nausea and vomiting that is not controlled by your nausea medication, call the clinic.   BELOW ARE SYMPTOMS THAT SHOULD BE REPORTED IMMEDIATELY:  *FEVER GREATER THAN 100.5 F  *CHILLS WITH OR WITHOUT FEVER  NAUSEA AND VOMITING THAT IS NOT CONTROLLED WITH YOUR NAUSEA MEDICATION  *UNUSUAL SHORTNESS OF BREATH  *UNUSUAL BRUISING OR BLEEDING  TENDERNESS IN MOUTH AND THROAT WITH OR WITHOUT PRESENCE OF ULCERS  *URINARY PROBLEMS  *BOWEL PROBLEMS  UNUSUAL RASH Items with * indicate a potential emergency and should be followed up as soon as possible.  Feel free to call the clinic you have any questions or concerns. The clinic phone number is [phone].  Bortezomib injection What is this medicine? BORTEZOMIB (bor TEZ oh mib) is a chemotherapy drug. It slows the growth of cancer cells. This medicine is used to treat multiple myeloma, lymphoma, and other cancers. This medicine may be used for other purposes; ask your health care provider or pharmacist if you have questions. What should I tell my health care provider before I take this medicine? They need to know if you have any of these conditions: -heart disease -irregular heartbeat -liver disease -low blood counts, like low white blood cells, platelets, or hemoglobin -peripheral neuropathy -taking medicine for blood pressure -an unusual or allergic reaction to bortezomib, mannitol, boron, other medicines, foods, dyes, or preservatives -pregnant or trying to get pregnant -breast-feeding How should I use this medicine? This medicine is for injection into a vein or for injection under the skin. It is given by a health care professional in a hospital or  clinic setting. Talk to your pediatrician regarding the use of this medicine in children. Special care may be needed. Overdosage: If you think you have taken too much of this medicine contact a poison control center or emergency room at once. NOTE: This medicine is only for you. Do not share this medicine with others. What if I miss a dose? It is important not to miss your dose. Call your doctor or health care professional if you are unable to keep an appointment. What may interact with this medicine? -medicines for diabetes -medicines to increase blood counts like filgrastim, pegfilgrastim, sargramostim -zalcitabine Talk to your doctor or health care professional before taking any of these medicines: -acetaminophen -aspirin -ibuprofen -ketoprofen -naproxen This list may not describe all possible interactions. Give your health care provider a list of all the medicines, herbs, non-prescription drugs, or dietary supplements you use. Also tell them if you smoke, drink alcohol, or use illegal drugs. Some items may interact with your medicine. What should I watch for while using this medicine? Visit your doctor for checks on your progress. This drug may make you feel generally unwell. This is not uncommon, as chemotherapy can affect healthy cells as well as cancer cells. Report any side effects. Continue your course of treatment even though you feel ill unless your doctor tells you to stop. You may get drowsy or dizzy. Do not drive, use machinery, or do anything that needs mental alertness until you know how this medicine affects you. Do not stand or sit up quickly, especially if you are an older patient. This reduces the risk of dizzy or fainting spells. In some cases, you may be given   additional medicines to help with side effects. Follow all directions for their use. Call your doctor or health care professional for advice if you get a fever, chills or sore throat, or other symptoms of a cold or  flu. Do not treat yourself. This drug decreases your body's ability to fight infections. Try to avoid being around people who are sick. This medicine may increase your risk to bruise or bleed. Call your doctor or health care professional if you notice any unusual bleeding. Be careful brushing and flossing your teeth or using a toothpick because you may get an infection or bleed more easily. If you have any dental work done, tell your dentist you are receiving this medicine. Avoid taking products that contain aspirin, acetaminophen, ibuprofen, naproxen, or ketoprofen unless instructed by your doctor. These medicines may hide a fever. Do not become pregnant while taking this medicine. Women should inform their doctor if they wish to become pregnant or think they might be pregnant. There is a potential for serious side effects to an unborn child. Talk to your health care professional or pharmacist for more information. Do not breast-feed an infant while taking this medicine. You may have vomiting or diarrhea while taking this medicine. Drink water or other fluids as directed. What side effects may I notice from receiving this medicine? Side effects that you should report to your doctor or health care professional as soon as possible: -allergic reactions like skin rash, itching or hives, swelling of the face, lips, or tongue -breathing problems -changes in hearing -changes in vision -fast, irregular heartbeat -feeling faint or lightheaded, falls -pain, tingling, numbness in the hands or feet -seizures -swelling of the ankles, feet, hands -unusual bleeding or bruising -unusually weak or tired -vomiting Side effects that usually do not require medical attention (report to your doctor or health care professional if they continue or are bothersome): -changes in emotions or moods -constipation -diarrhea -loss of appetite -headache -irritation at site where injected -nausea This list may not  describe all possible side effects. Call your doctor for medical advice about side effects. You may report side effects to FDA at 1-800-FDA-1088. Where should I keep my medicine? This drug is given in a hospital or clinic and will not be stored at home. NOTE: This sheet is a summary. It may not cover all possible information. If you have questions about this medicine, talk to your doctor, pharmacist, or health care provider.  2013, Elsevier/Gold Standard. (11/26/2010 11:42:36 AM)   

## 2013-07-05 ENCOUNTER — Telehealth: Payer: Self-pay | Admitting: *Deleted

## 2013-07-05 ENCOUNTER — Telehealth: Payer: Self-pay | Admitting: Hematology and Oncology

## 2013-07-05 LAB — KAPPA/LAMBDA LIGHT CHAINS: Kappa free light chain: 15.6 mg/dL — ABNORMAL HIGH (ref 0.33–1.94)

## 2013-07-05 LAB — PROTEIN ELECTROPHORESIS, SERUM
Alpha-1-Globulin: 9.2 % — ABNORMAL HIGH (ref 2.9–4.9)
Alpha-2-Globulin: 12.7 % — ABNORMAL HIGH (ref 7.1–11.8)
Beta 2: 2.5 % — ABNORMAL LOW (ref 3.2–6.5)
Gamma Globulin: 14.8 % (ref 11.1–18.8)

## 2013-07-05 NOTE — Telephone Encounter (Signed)
SENT [name] STAFF MESSAGE TO SCHEDULE THE VELCADE APPTS.

## 2013-07-05 NOTE — Telephone Encounter (Signed)
Per staff message and POF I have scheduled appts.  JMW  

## 2013-07-06 ENCOUNTER — Telehealth: Payer: Self-pay | Admitting: Hematology and Oncology

## 2013-07-06 NOTE — Progress Notes (Signed)
ID: Kendra Johns OB: February 03, 1951  MR#: [id number]  NWG#:[id number]  [facility]  [phone] [phone]   OFFICE PROGRESS NOTE  PCP: [name], MD GYN:   SU:  OTHER MD: DIAGNOSIS:   Multiple myeloma presented with bone disease and slight increase in kappa light chain. Initial diagnosis was in September 2007. She had presented initially with a plasmacytoma.   PAST THERAPY:  1. 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 Total of 45 Gy in 25 fractions in March 2008.  2. Induction therapy with Revlimid and dexamethasone. Subsequently, received Velcade and dexamethasone until September 2009.  3. High dose chemotherapy and stem cell transplant in September 2009.  Complete response. She has been on active surveillance since that time.  4. Relapsed multiple myeloma in the form of kappa light chain myeloma in June 2012. Salvage chemo: Velcade/Cytoxan/Dexamethasone on 05/05/11 with very good partial response.  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   CURRENT THERAPY: Maintenance therapy with  Velcade SQ weekly, 3 weeks on, 1 week off since Apr 01, 2012.   INTERVAL HISTORY:  JOSSLYN CIOLEK 62 y.o. female who returns for regular follow up visit. Her appetite is good. She has clear nasal discharge. Patient reported that she has constipation which she handle with MOM. She has pain intermittently in her legs, hips and feet which is not bad.  She plan to see a dentist at [employer] dental [facility] in the near future.The patient denied fever, chills, night sweats, change in weight. She denied headaches, double vision, blurry vision, nasal congestion, nasal discharge, hearing problems, odynophagia or dysphagia. No chest pain, palpitations, dyspnea, cough, abdominal pain, nausea, vomiting, diarrhea, hematochezia. The patient denied dysuria, nocturia, polyuria, hematuria, myalgia, numbness, tingling, psychiatric problems.  PAST  MEDICAL HISTORY: Past Medical History  Diagnosis Date  . Depression   . Hypertension   . Hepatitis C     genotype 1  . Diverticulosis of colon   . Multiple myeloma     kappa light chain, s/p high-dose chemo/stem cell tx - [facility]  . Hx of adenomatous colonic polyps 2007  . Anxiety   . Osteoarthritis   . Sleep apnea   . Hyperlipidemia   . GERD with stricture   . External hemorrhoids   . Hepatitis B virus infection   . IBS (irritable bowel syndrome)   . Helicobacter pylori gastritis 2001    ? if treated  . Sciatic nerve pain     with Dr. Delma Officer  . Chronic low back pain     PAST SURGICAL HISTORY: Past Surgical History  Procedure Laterality Date  . Abdominal hysterectomy    . Oophorectomy    . Dilation and curettage of uterus    . Bone marrow transplant      2009, 2013 [facility]  . Appendectomy    . Colonoscopy w/ polypectomy  08/2006    1-2 adenomas, 6 polyps total, diverticulosis and external hemorrhoids  . Upper gastrointestinal endoscopy  08/2003    esophageal stricture dilation, hiatal hernia, gastrritis  . Cholecystectomy    . Tonsillectomy and adenoidectomy      FAMILY HISTORY Family History  Problem Relation Age of Onset  . Alcohol abuse    . Lung cancer Father   . Leukemia Mother   . Cirrhosis Sister   . Colon cancer Neg Hx     HEALTH MAINTENANCE: History  Substance Use Topics  . Smoking status: Current Every Day Smoker -- 0.25  packs/day for 40 years    Types: Cigarettes  . Smokeless tobacco: Never Used     Comment: 8 cigarettes per day or less  . Alcohol Use: No    Allergies  Allergen Reactions  . Codeine   . Ibuprofen     REACTION: gi    Current Outpatient Prescriptions  Medication Sig Dispense Refill  . acyclovir (ZOVIRAX) 400 MG tablet Take 800 mg by mouth 2 (two) times daily.       Marland Kitchen albuterol (PROVENTIL HFA;VENTOLIN HFA) 108 (90 BASE) MCG/ACT inhaler Inhale 2 puffs into the lungs every 4 (four) hours as needed for wheezing or shortness  of breath.  1 Inhaler  2  . azithromycin (ZITHROMAX) 250 MG tablet As directed  6 tablet  0  . Bisacodyl (LAXATIVE PO) Take by mouth. As needed      . Bortezomib (VELCADE IJ) Inject as directed.      . diazepam (VALIUM) 5 MG tablet Take 1 tablet (5 mg total) by mouth as needed.  20 tablet  3  . Magnesium Hydroxide (MILK OF MAGNESIA PO) Take by mouth daily as needed.        Marland Kitchen morphine (MS CONTIN) 100 MG 12 hr tablet Take 100 mg by mouth 2 (two) times daily. Take 1 tablet every 12 hours      . morphine (MSIR) 15 MG tablet Take 15 mg by mouth every 4 (four) hours as needed. pain      . polyethylene glycol (MIRALAX / GLYCOLAX) packet Take 17 g by mouth every other day.        . prochlorperazine (COMPAZINE) 10 MG tablet Take 10 mg by mouth every 6 (six) hours as needed. nausea      . [DISCONTINUED] amLODipine (NORVASC) 5 MG tablet Take 1 tablet (5 mg total) by mouth daily.  90 tablet  3  . [DISCONTINUED] gabapentin (NEURONTIN) 600 MG tablet Take 600 mg by mouth 3 (three) times daily.        . [DISCONTINUED] lisinopril (PRINIVIL,ZESTRIL) 20 MG tablet Take 1 tablet (20 mg total) by mouth daily.  90 tablet  11   No current facility-administered medications for this visit.    OBJECTIVE: Filed Vitals:   06/30/13 1002  BP: 127/90  Pulse: 86  Temp: 97.9 F (36.6 C)  Resp: 18     Body mass index is 22.44 kg/(m^2).    ECOG FS: 1  PHYSICAL EXAMINATION:  HEENT: Sclerae anicteric.  Conjunctivae were pink. Pupils round and reactive bilaterally. Oral mucosa is moist without ulceration or thrush. No occipital, submandibular, cervical, supraclavicular or axillar adenopathy. Lungs: clear to auscultation without wheezes. No rales or rhonchi. Heart: regular rate and rhythm. No murmur, gallop or rubs. Abdomen: soft, non tender. No guarding or rebound tenderness. Bowel sounds are present. No palpable hepatosplenomegaly. MSK: no focal spinal tenderness. Extremities: No clubbing or cyanosis.No calf tenderness  to palpitation, no peripheral edema. The patient had grossly intact strength in upper and lower extremities. Skin exam was without ecchymosis, petechiae. Neuro: non-focal, alert and oriented to time, person and place, appropriate affect  LAB RESULTS:  CMP     Component Value Date/Time   NA 140 06/30/2013 0939   NA 138 09/19/2012 1350   K 4.1 06/30/2013 0939   K 4.2 09/19/2012 1350   CL 107 03/10/2013 1014   CL 103 09/19/2012 1350   CO2 25 06/30/2013 0939   CO2 27 09/19/2012 1350   GLUCOSE 109 06/30/2013 0939   GLUCOSE 111* 03/10/2013  1014   GLUCOSE 95 09/19/2012 1350   GLUCOSE 98 08/31/2006 1024   BUN 12.7 06/30/2013 0939   BUN 13 09/19/2012 1350   CREATININE 0.8 06/30/2013 0939   CREATININE 0.70 09/19/2012 1350   CALCIUM 9.4 06/30/2013 0939   CALCIUM 9.0 09/19/2012 1350   PROT 7.5 06/30/2013 0939   PROT 7.2 09/19/2012 1350   ALBUMIN 3.7 06/30/2013 0939   ALBUMIN 3.6 09/19/2012 1350   AST 33 06/30/2013 0939   AST 58* 09/19/2012 1350   ALT 29 06/30/2013 0939   ALT 51* 09/19/2012 1350   ALKPHOS 79 06/30/2013 0939   ALKPHOS 80 09/19/2012 1350   BILITOT 0.36 06/30/2013 0939   BILITOT 0.3 09/19/2012 1350   GFRNONAA >90 09/19/2012 1350   GFRAA >90 09/19/2012 1350    I No results found for this basename: SPEP, UPEP,  kappa and lambda light chains    Lab Results  Component Value Date   WBC 7.8 06/30/2013   NEUTROABS 4.0 06/30/2013   HGB 14.3 06/30/2013   HCT 41.8 06/30/2013   MCV 93.6 06/30/2013   PLT 179 06/30/2013      Chemistry      Component Value Date/Time   NA 140 06/30/2013 0939   NA 138 09/19/2012 1350   K 4.1 06/30/2013 0939   K 4.2 09/19/2012 1350   CL 107 03/10/2013 1014   CL 103 09/19/2012 1350   CO2 25 06/30/2013 0939   CO2 27 09/19/2012 1350   BUN 12.7 06/30/2013 0939   BUN 13 09/19/2012 1350   CREATININE 0.8 06/30/2013 0939   CREATININE 0.70 09/19/2012 1350      Component Value Date/Time   CALCIUM 9.4 06/30/2013 0939   CALCIUM 9.0 09/19/2012 1350   ALKPHOS 79  06/30/2013 0939   ALKPHOS 80 09/19/2012 1350   AST 33 06/30/2013 0939   AST 58* 09/19/2012 1350   ALT 29 06/30/2013 0939   ALT 51* 09/19/2012 1350   BILITOT 0.36 06/30/2013 0939   BILITOT 0.3 09/19/2012 1350       ASSESSMENT AND PLAN: 1. Multiple myeloma: Patient is doing well on Velcade maintenance.  We will continue maintenance with Velcade  Her M-spike was undetectible. Her kappa light chain started to rise 3 months ago. I will follow her M-spike and kappa light chain from today labs. Her CBC and CMET is normal today. 2. Prophylaxis: Acyclovir 400 mg p.o. b.i.d. to decrease her risk of herpes simplex virus reactivation while on Velcade 3. History of cirrhosis and hepatitis C: Diagnosed in 2009 with [facility] when she underwent the 1st transplant evaluation. She is compensated without encephalopathy, ascites, bleeding, cytopenia. She does not drink alcohol.  4. Follow up: Return visit in about 2 months.     Myra Rude, MD   06/30/2013 4:27 PM

## 2013-07-06 NOTE — Telephone Encounter (Signed)
S/w the pt and she is aware of her appts on 07/07/2013 and to pick up a schedule when she comes into the office.

## 2013-07-07 ENCOUNTER — Other Ambulatory Visit: Payer: Self-pay | Admitting: *Deleted

## 2013-07-07 ENCOUNTER — Other Ambulatory Visit: Payer: Self-pay | Admitting: Hematology and Oncology

## 2013-07-07 ENCOUNTER — Other Ambulatory Visit (HOSPITAL_BASED_OUTPATIENT_CLINIC_OR_DEPARTMENT_OTHER): Payer: Medicare Other | Admitting: Lab

## 2013-07-07 ENCOUNTER — Telehealth: Payer: Self-pay | Admitting: Hematology and Oncology

## 2013-07-07 ENCOUNTER — Ambulatory Visit (HOSPITAL_BASED_OUTPATIENT_CLINIC_OR_DEPARTMENT_OTHER): Payer: Medicare Other

## 2013-07-07 VITALS — BP 137/90 | HR 77 | Temp 97.1°F | Resp 18

## 2013-07-07 DIAGNOSIS — Z5112 Encounter for antineoplastic immunotherapy: Secondary | ICD-10-CM

## 2013-07-07 DIAGNOSIS — C9000 Multiple myeloma not having achieved remission: Secondary | ICD-10-CM

## 2013-07-07 LAB — CBC WITH DIFFERENTIAL/PLATELET
Basophils Absolute: 0 10*3/uL (ref 0.0–0.1)
Eosinophils Absolute: 0.1 10*3/uL (ref 0.0–0.5)
HGB: 14.2 g/dL (ref 11.6–15.9)
MCV: 94.2 fL (ref 79.5–101.0)
MONO#: 0.4 10*3/uL (ref 0.1–0.9)
NEUT#: 2.6 10*3/uL (ref 1.5–6.5)
Platelets: 162 10*3/uL (ref 145–400)
RBC: 4.42 10*6/uL (ref 3.70–5.45)
RDW: 13.4 % (ref 11.2–14.5)
WBC: 5.9 10*3/uL (ref 3.9–10.3)

## 2013-07-07 LAB — COMPREHENSIVE METABOLIC PANEL (CC13)
Albumin: 3.5 g/dL (ref 3.5–5.0)
BUN: 13.8 mg/dL (ref 7.0–26.0)
CO2: 25 mEq/L (ref 22–29)
Calcium: 9.6 mg/dL (ref 8.4–10.4)
Glucose: 103 mg/dl (ref 70–140)
Potassium: 4 mEq/L (ref 3.5–5.1)
Sodium: 139 mEq/L (ref 136–145)
Total Protein: 7 g/dL (ref 6.4–8.3)

## 2013-07-07 MED ORDER — ONDANSETRON HCL 8 MG PO TABS
ORAL_TABLET | ORAL | Status: AC
  Administered 2013-07-07: 8 mg
  Filled 2013-07-07: qty 1

## 2013-07-07 MED ORDER — BORTEZOMIB CHEMO SQ INJECTION 3.5 MG (2.5MG/ML)
0.9750 mg/m2 | Freq: Once | INTRAMUSCULAR | Status: AC
  Administered 2013-07-07: 1.5 mg via SUBCUTANEOUS
  Filled 2013-07-07: qty 1.5

## 2013-07-07 MED ORDER — ONDANSETRON HCL 8 MG PO TABS: 8.0000 mg | ORAL_TABLET | Freq: Once | ORAL | Status: AC

## 2013-07-07 NOTE — Patient Instructions (Addendum)
[facility] Discharge Instructions for Patients Receiving Chemotherapy  Today you received the following chemotherapy agents: Velcade.  To help prevent nausea and vomiting after your treatment, we encourage you to take your nausea medication as prescribed.   If you develop nausea and vomiting that is not controlled by your nausea medication, call the clinic.   BELOW ARE SYMPTOMS THAT SHOULD BE REPORTED IMMEDIATELY:  *FEVER GREATER THAN 100.5 F  *CHILLS WITH OR WITHOUT FEVER  NAUSEA AND VOMITING THAT IS NOT CONTROLLED WITH YOUR NAUSEA MEDICATION  *UNUSUAL SHORTNESS OF BREATH  *UNUSUAL BRUISING OR BLEEDING  TENDERNESS IN MOUTH AND THROAT WITH OR WITHOUT PRESENCE OF ULCERS  *URINARY PROBLEMS  *BOWEL PROBLEMS  UNUSUAL RASH Items with * indicate a potential emergency and should be followed up as soon as possible.  Feel free to call the clinic you have any questions or concerns. The clinic phone number is [phone].    

## 2013-07-07 NOTE — Telephone Encounter (Signed)
gv and printed appt sched and avs for pt,...per Dr. Myriam Jacobson labs.

## 2013-07-13 ENCOUNTER — Other Ambulatory Visit: Payer: Self-pay | Admitting: *Deleted

## 2013-07-13 ENCOUNTER — Other Ambulatory Visit: Payer: Self-pay | Admitting: Oncology

## 2013-07-14 ENCOUNTER — Other Ambulatory Visit (HOSPITAL_BASED_OUTPATIENT_CLINIC_OR_DEPARTMENT_OTHER): Payer: Medicare Other | Admitting: Lab

## 2013-07-14 ENCOUNTER — Ambulatory Visit (HOSPITAL_BASED_OUTPATIENT_CLINIC_OR_DEPARTMENT_OTHER): Payer: Medicare Other

## 2013-07-14 ENCOUNTER — Other Ambulatory Visit: Payer: Self-pay | Admitting: Hematology and Oncology

## 2013-07-14 VITALS — BP 151/98 | HR 81 | Temp 98.5°F | Resp 18

## 2013-07-14 DIAGNOSIS — C9001 Multiple myeloma in remission: Secondary | ICD-10-CM

## 2013-07-14 DIAGNOSIS — C9000 Multiple myeloma not having achieved remission: Secondary | ICD-10-CM

## 2013-07-14 DIAGNOSIS — Z5112 Encounter for antineoplastic immunotherapy: Secondary | ICD-10-CM

## 2013-07-14 LAB — COMPREHENSIVE METABOLIC PANEL (CC13)
Albumin: 3.8 g/dL (ref 3.5–5.0)
Alkaline Phosphatase: 90 U/L (ref 40–150)
CO2: 25 mEq/L (ref 22–29)
Calcium: 9.6 mg/dL (ref 8.4–10.4)
Chloride: 109 mEq/L (ref 98–109)
Glucose: 89 mg/dl (ref 70–140)
Potassium: 3.9 mEq/L (ref 3.5–5.1)
Sodium: 141 mEq/L (ref 136–145)
Total Protein: 7.7 g/dL (ref 6.4–8.3)

## 2013-07-14 LAB — CBC WITH DIFFERENTIAL/PLATELET
Eosinophils Absolute: 0.1 10*3/uL (ref 0.0–0.5)
HGB: 14.7 g/dL (ref 11.6–15.9)
MONO#: 0.4 10*3/uL (ref 0.1–0.9)
MONO%: 6 % (ref 0.0–14.0)
NEUT#: 4.3 10*3/uL (ref 1.5–6.5)
RBC: 4.66 10*6/uL (ref 3.70–5.45)
RDW: 13.7 % (ref 11.2–14.5)
WBC: 7.2 10*3/uL (ref 3.9–10.3)
lymph#: 2.2 10*3/uL (ref 0.9–3.3)

## 2013-07-14 MED ORDER — ONDANSETRON HCL 8 MG PO TABS
8.0000 mg | ORAL_TABLET | Freq: Once | ORAL | Status: AC
  Administered 2013-07-14: 8 mg via ORAL

## 2013-07-14 MED ORDER — BORTEZOMIB CHEMO SQ INJECTION 3.5 MG (2.5MG/ML)
0.9750 mg/m2 | Freq: Once | INTRAMUSCULAR | Status: AC
  Administered 2013-07-14: 1.5 mg via SUBCUTANEOUS
  Filled 2013-07-14: qty 1.5

## 2013-07-14 MED ORDER — ONDANSETRON HCL 8 MG PO TABS
ORAL_TABLET | ORAL | Status: AC
  Filled 2013-07-14: qty 1

## 2013-07-14 NOTE — Patient Instructions (Signed)
[facility] Discharge Instructions for Patients Receiving Chemotherapy  Today you received the following chemotherapy agents: Velcade.  To help prevent nausea and vomiting after your treatment, we encourage you to take your nausea medication as prescribed.   If you develop nausea and vomiting that is not controlled by your nausea medication, call the clinic.   BELOW ARE SYMPTOMS THAT SHOULD BE REPORTED IMMEDIATELY:  *FEVER GREATER THAN 100.5 F  *CHILLS WITH OR WITHOUT FEVER  NAUSEA AND VOMITING THAT IS NOT CONTROLLED WITH YOUR NAUSEA MEDICATION  *UNUSUAL SHORTNESS OF BREATH  *UNUSUAL BRUISING OR BLEEDING  TENDERNESS IN MOUTH AND THROAT WITH OR WITHOUT PRESENCE OF ULCERS  *URINARY PROBLEMS  *BOWEL PROBLEMS  UNUSUAL RASH Items with * indicate a potential emergency and should be followed up as soon as possible.  Feel free to call the clinic you have any questions or concerns. The clinic phone number is [phone].    

## 2013-07-27 ENCOUNTER — Other Ambulatory Visit: Payer: Self-pay | Admitting: Hematology and Oncology

## 2013-07-27 DIAGNOSIS — C9000 Multiple myeloma not having achieved remission: Secondary | ICD-10-CM

## 2013-07-28 ENCOUNTER — Other Ambulatory Visit (HOSPITAL_BASED_OUTPATIENT_CLINIC_OR_DEPARTMENT_OTHER): Payer: Medicare Other | Admitting: Lab

## 2013-07-28 ENCOUNTER — Ambulatory Visit (HOSPITAL_BASED_OUTPATIENT_CLINIC_OR_DEPARTMENT_OTHER): Payer: Medicare Other

## 2013-07-28 VITALS — BP 128/88 | HR 76 | Temp 97.5°F

## 2013-07-28 DIAGNOSIS — C9000 Multiple myeloma not having achieved remission: Secondary | ICD-10-CM

## 2013-07-28 DIAGNOSIS — C9001 Multiple myeloma in remission: Secondary | ICD-10-CM

## 2013-07-28 DIAGNOSIS — Z5112 Encounter for antineoplastic immunotherapy: Secondary | ICD-10-CM

## 2013-07-28 LAB — COMPREHENSIVE METABOLIC PANEL (CC13)
ALT: 36 U/L (ref 0–55)
Alkaline Phosphatase: 94 U/L (ref 40–150)
CO2: 23 mEq/L (ref 22–29)
Calcium: 9.6 mg/dL (ref 8.4–10.4)
Potassium: 3.7 mEq/L (ref 3.5–5.1)
Sodium: 140 mEq/L (ref 136–145)
Total Bilirubin: 0.44 mg/dL (ref 0.20–1.20)
Total Protein: 8 g/dL (ref 6.4–8.3)

## 2013-07-28 LAB — CBC WITH DIFFERENTIAL/PLATELET
Basophils Absolute: 0 10*3/uL (ref 0.0–0.1)
Eosinophils Absolute: 0.1 10*3/uL (ref 0.0–0.5)
LYMPH%: 38.5 % (ref 14.0–49.7)
MCH: 31.9 pg (ref 25.1–34.0)
MCHC: 34.1 g/dL (ref 31.5–36.0)
MCV: 93.5 fL (ref 79.5–101.0)
RDW: 13.7 % (ref 11.2–14.5)
lymph#: 3.6 10*3/uL — ABNORMAL HIGH (ref 0.9–3.3)

## 2013-07-28 MED ORDER — ONDANSETRON HCL 8 MG PO TABS
8.0000 mg | ORAL_TABLET | Freq: Once | ORAL | Status: AC
  Administered 2013-07-28: 8 mg via ORAL

## 2013-07-28 MED ORDER — ONDANSETRON HCL 8 MG PO TABS
ORAL_TABLET | ORAL | Status: AC
  Filled 2013-07-28: qty 1

## 2013-07-28 MED ORDER — BORTEZOMIB CHEMO SQ INJECTION 3.5 MG (2.5MG/ML)
0.9750 mg/m2 | Freq: Once | INTRAMUSCULAR | Status: AC
  Administered 2013-07-28: 1.5 mg via SUBCUTANEOUS
  Filled 2013-07-28: qty 1.5

## 2013-07-28 NOTE — Patient Instructions (Addendum)
[facility] Discharge Instructions for Patients Receiving Chemotherapy  Today you received the following chemotherapy agent: Velcade   To help prevent nausea and vomiting after your treatment, we encourage you to take your nausea medication as prescribed.    If you develop nausea and vomiting that is not controlled by your nausea medication, call the clinic.   BELOW ARE SYMPTOMS THAT SHOULD BE REPORTED IMMEDIATELY:  *FEVER GREATER THAN 100.5 F  *CHILLS WITH OR WITHOUT FEVER  NAUSEA AND VOMITING THAT IS NOT CONTROLLED WITH YOUR NAUSEA MEDICATION  *UNUSUAL SHORTNESS OF BREATH  *UNUSUAL BRUISING OR BLEEDING  TENDERNESS IN MOUTH AND THROAT WITH OR WITHOUT PRESENCE OF ULCERS  *URINARY PROBLEMS  *BOWEL PROBLEMS  UNUSUAL RASH Items with * indicate a potential emergency and should be followed up as soon as possible.  Feel free to call the clinic you have any questions or concerns. The clinic phone number is [phone].    

## 2013-07-31 ENCOUNTER — Other Ambulatory Visit: Payer: Self-pay | Admitting: Hematology and Oncology

## 2013-08-01 LAB — SPEP & IFE WITH QIG
Albumin ELP: 59 % (ref 55.8–66.1)
Alpha-2-Globulin: 12.7 % — ABNORMAL HIGH (ref 7.1–11.8)
Beta 2: 2.9 % — ABNORMAL LOW (ref 3.2–6.5)
Beta Globulin: 5.7 % (ref 4.7–7.2)
IgA: 78 mg/dL (ref 69–380)
IgG (Immunoglobin G), Serum: 1180 mg/dL (ref 690–1700)
Total Protein, Serum Electrophoresis: 7 g/dL (ref 6.0–8.3)

## 2013-08-01 LAB — BETA 2 MICROGLOBULIN, SERUM: Beta-2 Microglobulin: 2.19 mg/L — ABNORMAL HIGH (ref 1.01–1.73)

## 2013-08-01 LAB — KAPPA/LAMBDA LIGHT CHAINS
Kappa free light chain: 42.2 mg/dL — ABNORMAL HIGH (ref 0.33–1.94)
Lambda Free Lght Chn: 1.8 mg/dL (ref 0.57–2.63)

## 2013-08-04 ENCOUNTER — Other Ambulatory Visit: Payer: Self-pay | Admitting: *Deleted

## 2013-08-04 ENCOUNTER — Ambulatory Visit: Payer: Medicare Other

## 2013-08-04 ENCOUNTER — Other Ambulatory Visit: Payer: Self-pay | Admitting: Hematology and Oncology

## 2013-08-04 ENCOUNTER — Other Ambulatory Visit (HOSPITAL_BASED_OUTPATIENT_CLINIC_OR_DEPARTMENT_OTHER): Payer: Medicare Other | Admitting: Lab

## 2013-08-04 ENCOUNTER — Telehealth: Payer: Self-pay | Admitting: Hematology and Oncology

## 2013-08-04 ENCOUNTER — Encounter: Payer: Self-pay | Admitting: Hematology and Oncology

## 2013-08-04 ENCOUNTER — Ambulatory Visit (HOSPITAL_BASED_OUTPATIENT_CLINIC_OR_DEPARTMENT_OTHER): Payer: Medicare Other | Admitting: Hematology and Oncology

## 2013-08-04 VITALS — BP 138/86 | HR 77 | Temp 97.9°F | Resp 18 | Ht 64.0 in | Wt 135.1 lb

## 2013-08-04 DIAGNOSIS — C9002 Multiple myeloma in relapse: Secondary | ICD-10-CM

## 2013-08-04 DIAGNOSIS — R5381 Other malaise: Secondary | ICD-10-CM

## 2013-08-04 DIAGNOSIS — C9000 Multiple myeloma not having achieved remission: Secondary | ICD-10-CM

## 2013-08-04 DIAGNOSIS — F329 Major depressive disorder, single episode, unspecified: Secondary | ICD-10-CM

## 2013-08-04 DIAGNOSIS — G609 Hereditary and idiopathic neuropathy, unspecified: Secondary | ICD-10-CM

## 2013-08-04 DIAGNOSIS — F17200 Nicotine dependence, unspecified, uncomplicated: Secondary | ICD-10-CM

## 2013-08-04 DIAGNOSIS — M549 Dorsalgia, unspecified: Secondary | ICD-10-CM

## 2013-08-04 LAB — COMPREHENSIVE METABOLIC PANEL (CC13)
ALT: 49 U/L (ref 0–55)
AST: 50 U/L — ABNORMAL HIGH (ref 5–34)
Alkaline Phosphatase: 83 U/L (ref 40–150)
BUN: 14.8 mg/dL (ref 7.0–26.0)
Calcium: 9.7 mg/dL (ref 8.4–10.4)
Chloride: 106 mEq/L (ref 98–109)
Creatinine: 0.9 mg/dL (ref 0.6–1.1)
Glucose: 141 mg/dl — ABNORMAL HIGH (ref 70–140)
Total Bilirubin: 0.33 mg/dL (ref 0.20–1.20)

## 2013-08-04 LAB — CBC WITH DIFFERENTIAL/PLATELET
Basophils Absolute: 0.1 10*3/uL (ref 0.0–0.1)
EOS%: 2.3 % (ref 0.0–7.0)
HCT: 41.4 % (ref 34.8–46.6)
HGB: 14 g/dL (ref 11.6–15.9)
LYMPH%: 44.3 % (ref 14.0–49.7)
MCH: 31.7 pg (ref 25.1–34.0)
MCV: 93.9 fL (ref 79.5–101.0)
MONO%: 6.3 % (ref 0.0–14.0)
NEUT%: 46.1 % (ref 38.4–76.8)
Platelets: 142 10*3/uL — ABNORMAL LOW (ref 145–400)
RBC: 4.41 10*6/uL (ref 3.70–5.45)
lymph#: 3.2 10*3/uL (ref 0.9–3.3)

## 2013-08-04 NOTE — Progress Notes (Signed)
[facility] OFFICE PROGRESS NOTE  [name], MD  DIAGNOSIS: Multiple myeloma, kappa light chain subtype, for further management  SUMMARY OF ONCOLOGIC HISTORY: Initial diagnosis was in September 2007. She had presented initially with a plasmacytoma.   PAST THERAPY:  1. 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 Total of 45 Gy in 25 fractions in March 2008. She developed complication was significant compression of her bone requiring surgery 2. Induction therapy with Revlimid and dexamethasone. She also received Zometa Subsequently, received Velcade and dexamethasone until September 2009.  3. High dose chemotherapy and stem cell transplant at [facility] in September 2009.  Complete response. She has been on active surveillance since that time.  4. Relapsed multiple myeloma in the form of kappa light chain myeloma in June 2012. Salvage chemo: Velcade/Cytoxan/Dexamethasone on 05/05/11 with very good partial response.  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   CURRENT THERAPY: Maintenance therapy with  Velcade SQ weekly, 3 weeks on, 1 week off since Apr 01, 2012.   INTERVAL HISTORY: Kendra Johns 62 y.o. female returns for urgent evaluation. She was supposed to get her Revlimid today but was noticed that her kappa light chains started to go up in the past month. The patient complain of significant fatigue as well as significant back pain. She also has some numbness with peripheral neuropathy and weakness overall. The patient has also poor dentition and has not seen a dentist for a while. She continued to smoke cigarettes about a quarter to a half a pack of cigarettes per day. She is interested to quit. She denies any new areas of bone pain. No recent infections such as fevers, chills, or cough. She has significant constipation requiring regular laxatives.  I have reviewed the past medical history, past surgical history, social  history and family history with the patient and they are unchanged from previous note.  ALLERGIES:  is allergic to codeine and ibuprofen.  MEDICATIONS: Current outpatient prescriptions:acyclovir (ZOVIRAX) 400 MG tablet, Take 800 mg by mouth 2 (two) times daily. , Disp: , Rfl: ;  albuterol (PROVENTIL HFA;VENTOLIN HFA) 108 (90 BASE) MCG/ACT inhaler, Inhale 2 puffs into the lungs every 4 (four) hours as needed for wheezing or shortness of breath., Disp: 1 Inhaler, Rfl: 2;  azithromycin (ZITHROMAX) 250 MG tablet, As directed, Disp: 6 tablet, Rfl: 0 Bisacodyl (LAXATIVE PO), Take by mouth. As needed, Disp: , Rfl: ;  Bortezomib (VELCADE IJ), Inject as directed., Disp: , Rfl: ;  diazepam (VALIUM) 5 MG tablet, Take 1 tablet (5 mg total) by mouth as needed., Disp: 20 tablet, Rfl: 3;  Magnesium Hydroxide (MILK OF MAGNESIA PO), Take by mouth daily as needed.  , Disp: , Rfl:  morphine (MS CONTIN) 100 MG 12 hr tablet, Take 100 mg by mouth 2 (two) times daily. Take 1 tablet every 12 hours, Disp: , Rfl: ;  morphine (MSIR) 15 MG tablet, Take 15 mg by mouth every 4 (four) hours as needed. pain, Disp: , Rfl: ;  polyethylene glycol (MIRALAX / GLYCOLAX) packet, Take 17 g by mouth every other day.  , Disp: , Rfl: ;  prochlorperazine (COMPAZINE) 10 MG tablet, Take 10 mg by mouth every 6 (six) hours as needed. nausea, Disp: , Rfl:  [DISCONTINUED] amLODipine (NORVASC) 5 MG tablet, Take 1 tablet (5 mg total) by mouth daily., Disp: 90 tablet, Rfl: 3;  [DISCONTINUED] gabapentin (NEURONTIN) 600 MG tablet, Take 600 mg by mouth 3 (three) times daily.  , Disp: ,  Rfl: ;  [DISCONTINUED] lisinopril (PRINIVIL,ZESTRIL) 20 MG tablet, Take 1 tablet (20 mg total) by mouth daily., Disp: 90 tablet, Rfl: 11  REVIEW OF SYSTEMS:   Constitutional: Denies fevers, chills or abnormal weight loss Eyes: Denies blurriness of vision Ears, nose, mouth, throat, and face: Denies mucositis or sore throat Respiratory: Denies cough, dyspnea or  wheezes Cardiovascular: Denies palpitation, chest discomfort or lower extremity swelling Gastrointestinal:  Denies nausea, heartburn or change in bowel habits Skin: Denies abnormal skin rashes Lymphatics: Denies new lymphadenopathy or easy bruising Behavioral/Psych: Mood is stable, no new changes  All other systems were reviewed with the patient and are negative.  PHYSICAL EXAMINATION: ECOG PERFORMANCE STATUS: 1 - Symptomatic but completely ambulatory  Filed Vitals:   08/04/13 1030  BP: 138/86  Pulse: 77  Temp: 97.9 F (36.6 C)  Resp: 18   Filed Weights   08/04/13 1030  Weight: 135 lb 1.6 oz (61.281 kg)    GENERAL:alert, no distress and comfortable SKIN: skin color, texture, turgor are normal, no rashes or significant lesions EYES: normal, Conjunctiva are pink and non-injected, sclera clear OROPHARYNX:no exudate, no erythema and lips, buccal mucosa, and tongue normal  NECK: supple, thyroid normal size, non-tender, without nodularity LYMPH:  no palpable lymphadenopathy in the cervical, axillary or inguinal LUNGS: clear to auscultation and percussion with normal breathing effort HEART: regular rate & rhythm and no murmurs and no lower extremity edema ABDOMEN:abdomen soft, non-tender and normal bowel sounds Musculoskeletal:no cyanosis of digits and no clubbing  NEURO: alert & oriented x 3 with fluent speech, no focal motor/sensory deficits  LABORATORY DATA:  I have reviewed the data as listed    Component Value Date/Time   NA 140 08/04/2013 0911   NA 138 09/19/2012 1350   K 4.8 08/04/2013 0911   K 4.2 09/19/2012 1350   CL 107 03/10/2013 1014   CL 103 09/19/2012 1350   CO2 25 08/04/2013 0911   CO2 27 09/19/2012 1350   GLUCOSE 141* 08/04/2013 0911   GLUCOSE 111* 03/10/2013 1014   GLUCOSE 95 09/19/2012 1350   GLUCOSE 98 08/31/2006 1024   BUN 14.8 08/04/2013 0911   BUN 13 09/19/2012 1350   CREATININE 0.9 08/04/2013 0911   CREATININE 0.70 09/19/2012 1350   CALCIUM 9.7 08/04/2013  0911   CALCIUM 9.0 09/19/2012 1350   PROT 7.0 08/04/2013 0911   PROT 7.2 09/19/2012 1350   ALBUMIN 3.5 08/04/2013 0911   ALBUMIN 3.6 09/19/2012 1350   AST 50* 08/04/2013 0911   AST 58* 09/19/2012 1350   ALT 49 08/04/2013 0911   ALT 51* 09/19/2012 1350   ALKPHOS 83 08/04/2013 0911   ALKPHOS 80 09/19/2012 1350   BILITOT 0.33 08/04/2013 0911   BILITOT 0.3 09/19/2012 1350   GFRNONAA >90 09/19/2012 1350   GFRAA >90 09/19/2012 1350    No results found for this basename: SPEP, UPEP,  kappa and lambda light chains    Lab Results  Component Value Date   WBC 7.3 08/04/2013   NEUTROABS 3.4 08/04/2013   HGB 14.0 08/04/2013   HCT 41.4 08/04/2013   MCV 93.9 08/04/2013   PLT 142* 08/04/2013      Chemistry      Component Value Date/Time   NA 140 08/04/2013 0911   NA 138 09/19/2012 1350   K 4.8 08/04/2013 0911   K 4.2 09/19/2012 1350   CL 107 03/10/2013 1014   CL 103 09/19/2012 1350   CO2 25 08/04/2013 0911   CO2 27 09/19/2012 1350  BUN 14.8 08/04/2013 0911   BUN 13 09/19/2012 1350   CREATININE 0.9 08/04/2013 0911   CREATININE 0.70 09/19/2012 1350      Component Value Date/Time   CALCIUM 9.7 08/04/2013 0911   CALCIUM 9.0 09/19/2012 1350   ALKPHOS 83 08/04/2013 0911   ALKPHOS 80 09/19/2012 1350   AST 50* 08/04/2013 0911   AST 58* 09/19/2012 1350   ALT 49 08/04/2013 0911   ALT 51* 09/19/2012 1350   BILITOT 0.33 08/04/2013 0911   BILITOT 0.3 09/19/2012 1350       ASSESSMENT: Multiple myeloma, possible disease progression with elevated light chains  PLAN:  #1 multiple myeloma I recommend you discontinue Revlimid. Based on the bloodwork that is trending upwards, I'm afraid her disease has relapsed. I recommend restaging her with blood work, 24-hour urine collection, and a PET CT scan to restage her disease. She may also need a bone marrow aspirate and biopsy in the near future. I recommend she return to [facility] for evaluation by her transplant physician for second opinion. I recommend we switch her  treatment to the carfilzomib or Pomalyst.we will have further discussion when she return in 2 weeks for further assessment. #2 smoking I recommend she quit smoking completely. The patient is motivated. #3 peripheral neuropathy This is likely secondary to Velcade. We will discontinue treatment today. The patient will continue on conservative management with Neurontin and a pain medication  #4 severe back pain This could be due to recurrence of disease or osteomalacia. She has not been prescribed calcium and vitamin D supplements. I would recommend she start taking over-the-counter supplements. #5 preventive care Will administer influenza vaccination. She will continue prophylaxis with acyclovir #6 history of hepatitis C and liver sources Show no signs and symptoms of active hepatitis. We will monitor her carefully. #7 poor dentition I recommend she follow with the dentist closely. For that reason, I would not recommend IV bisphosphonates for fear of causing osteonecrosis of the jaw.   All questions were answered. The patient knows to call the clinic with any problems, questions or concerns. We can certainly see the patient much sooner if necessary. No barriers to learning was detected. I spent 40 minutes counseling the patient face to face. The total time spent in the appointment was 60 minutes and more than 50% was on counseling and review of test results     [facility], Ayde Record, MD 08/04/2013 12:53 PM

## 2013-08-04 NOTE — Patient Instructions (Addendum)
[facility] Discharge Instructions for Patients Receiving Chemotherapy  Today you received the following chemotherapy agents Velcade.  To help prevent nausea and vomiting after your treatment, we encourage you to take your nausea medication Compazine.   If you develop nausea and vomiting that is not controlled by your nausea medication, call the clinic.   BELOW ARE SYMPTOMS THAT SHOULD BE REPORTED IMMEDIATELY:  *FEVER GREATER THAN 100.5 F  *CHILLS WITH OR WITHOUT FEVER  NAUSEA AND VOMITING THAT IS NOT CONTROLLED WITH YOUR NAUSEA MEDICATION  *UNUSUAL SHORTNESS OF BREATH  *UNUSUAL BRUISING OR BLEEDING  TENDERNESS IN MOUTH AND THROAT WITH OR WITHOUT PRESENCE OF ULCERS  *URINARY PROBLEMS  *BOWEL PROBLEMS  UNUSUAL RASH Items with * indicate a potential emergency and should be followed up as soon as possible.  Feel free to call the clinic you have any questions or concerns. The clinic phone number is [phone].    

## 2013-08-04 NOTE — Telephone Encounter (Signed)
gv adn printed appt sched and avs for pt for OCT °

## 2013-08-04 NOTE — Progress Notes (Signed)
Pt is seeing Dr Bertis Ruddy.

## 2013-08-07 ENCOUNTER — Other Ambulatory Visit (HOSPITAL_BASED_OUTPATIENT_CLINIC_OR_DEPARTMENT_OTHER): Payer: Medicare Other

## 2013-08-07 ENCOUNTER — Other Ambulatory Visit: Payer: Self-pay | Admitting: Hematology and Oncology

## 2013-08-07 DIAGNOSIS — F329 Major depressive disorder, single episode, unspecified: Secondary | ICD-10-CM

## 2013-08-07 DIAGNOSIS — R5381 Other malaise: Secondary | ICD-10-CM

## 2013-08-07 DIAGNOSIS — C9000 Multiple myeloma not having achieved remission: Secondary | ICD-10-CM

## 2013-08-07 LAB — CBC WITH DIFFERENTIAL/PLATELET
BASO%: 1.1 % (ref 0.0–2.0)
EOS%: 1.6 % (ref 0.0–7.0)
HCT: 42.3 % (ref 34.8–46.6)
MCH: 31.4 pg (ref 25.1–34.0)
MCHC: 33.5 g/dL (ref 31.5–36.0)
MCV: 93.5 fL (ref 79.5–101.0)
MONO#: 0.5 10*3/uL (ref 0.1–0.9)
NEUT%: 48.7 % (ref 38.4–76.8)
RBC: 4.53 10*6/uL (ref 3.70–5.45)
WBC: 7.6 10*3/uL (ref 3.9–10.3)
lymph#: 3.2 10*3/uL (ref 0.9–3.3)

## 2013-08-07 LAB — COMPREHENSIVE METABOLIC PANEL (CC13)
ALT: 47 U/L (ref 0–55)
AST: 47 U/L — ABNORMAL HIGH (ref 5–34)
Albumin: 3.7 g/dL (ref 3.5–5.0)
CO2: 24 mEq/L (ref 22–29)
Calcium: 9.8 mg/dL (ref 8.4–10.4)
Chloride: 106 mEq/L (ref 98–109)
Creatinine: 0.8 mg/dL (ref 0.6–1.1)
Glucose: 94 mg/dl (ref 70–140)
Sodium: 139 mEq/L (ref 136–145)
Total Bilirubin: 0.37 mg/dL (ref 0.20–1.20)
Total Protein: 7.5 g/dL (ref 6.4–8.3)

## 2013-08-07 LAB — TSH CHCC: TSH: 2.685 m(IU)/L (ref 0.308–3.960)

## 2013-08-07 LAB — LACTATE DEHYDROGENASE (CC13): LDH: 223 U/L (ref 125–245)

## 2013-08-09 LAB — UIFE/LIGHT CHAINS/TP QN, 24-HR UR
Albumin, U: DETECTED
Alpha 2, Urine: DETECTED — AB
Free Kappa Lt Chains,Ur: 243 mg/dL — ABNORMAL HIGH (ref 0.14–2.42)
Free Kappa/Lambda Ratio: 1278.95 ratio — ABNORMAL HIGH (ref 2.04–10.37)
Free Lambda Excretion/Day: 1.9 mg/d
Free Lambda Lt Chains,Ur: 0.19 mg/dL (ref 0.02–0.67)
Free Lt Chn Excr Rate: 2430 mg/d
Time: 24 hours
Total Protein, Urine: 243.7 mg/dL
Volume, Urine: 1000 mL

## 2013-08-09 LAB — T4, FREE: Free T4: 1.13 ng/dL (ref 0.80–1.80)

## 2013-08-09 LAB — SPEP & IFE WITH QIG
Albumin ELP: 58.2 % (ref 55.8–66.1)
Alpha-1-Globulin: 4.8 % (ref 2.9–4.9)
Alpha-2-Globulin: 13.2 % — ABNORMAL HIGH (ref 7.1–11.8)
Beta 2: 2.8 % — ABNORMAL LOW (ref 3.2–6.5)
Beta Globulin: 5.8 % (ref 4.7–7.2)
Gamma Globulin: 15.2 % (ref 11.1–18.8)
IgA: 71 mg/dL (ref 69–380)
IgG (Immunoglobin G), Serum: 1220 mg/dL (ref 690–1700)
IgM, Serum: 104 mg/dL (ref 52–322)
Total Protein, Serum Electrophoresis: 6.9 g/dL (ref 6.0–8.3)

## 2013-08-09 LAB — UPEP/TP, 24-HR URINE
Alpha-1-Globulin, U: 13.4 %
Collection Interval: 24 hours
Gamma Globulin, U: 62.7 %
Monoclonal Band 1: 52.4 %
Total Protein, Urine: 35 mg/dL
Total Volume, Urine: 1100 mL

## 2013-08-09 LAB — KAPPA/LAMBDA LIGHT CHAINS
Kappa free light chain: 62.6 mg/dL — ABNORMAL HIGH (ref 0.33–1.94)
Kappa:Lambda Ratio: 32.95 — ABNORMAL HIGH (ref 0.26–1.65)
Lambda Free Lght Chn: 1.9 mg/dL (ref 0.57–2.63)

## 2013-08-09 LAB — BETA 2 MICROGLOBULIN, SERUM: Beta-2 Microglobulin: 2.49 mg/L — ABNORMAL HIGH (ref 1.01–1.73)

## 2013-08-09 LAB — VITAMIN D 25 HYDROXY (VIT D DEFICIENCY, FRACTURES): Vit D, 25-Hydroxy: 16 ng/mL — ABNORMAL LOW (ref 30–89)

## 2013-08-11 ENCOUNTER — Ambulatory Visit: Payer: Self-pay

## 2013-08-11 ENCOUNTER — Other Ambulatory Visit: Payer: Self-pay | Admitting: Lab

## 2013-08-17 ENCOUNTER — Encounter (HOSPITAL_COMMUNITY): Payer: Medicare Other

## 2013-08-17 ENCOUNTER — Telehealth: Payer: Self-pay | Admitting: *Deleted

## 2013-08-17 ENCOUNTER — Telehealth: Payer: Self-pay | Admitting: Hematology and Oncology

## 2013-08-17 NOTE — Telephone Encounter (Signed)
lvm for pt regarding to OCT appts and r/s of appt due to move of PET

## 2013-08-17 NOTE — Telephone Encounter (Signed)
PET scan was delayed from today until 10/28 due to not being Pre Certed yet.  Informed pt of need to r/s office visit w/ Dr. Bertis Ruddy until after PET can be done.  Instructed her to call Dr. Ranell Patrick at [facility] for f/u visit scheduled as soon after PET as possible for second opininon.  Pt verbalized understanding.

## 2013-08-18 ENCOUNTER — Ambulatory Visit: Payer: Self-pay | Admitting: Hematology and Oncology

## 2013-08-21 ENCOUNTER — Telehealth: Payer: Self-pay | Admitting: *Deleted

## 2013-08-21 NOTE — Telephone Encounter (Signed)
Returned pt call regarding PET scan, s/w Lanora Manis in managed care- pt's PET scan was approved. appt for oct 28. Notified pt.

## 2013-08-23 ENCOUNTER — Telehealth: Payer: Self-pay | Admitting: *Deleted

## 2013-08-23 NOTE — Telephone Encounter (Signed)
Pt left VM asks if ok for her to get a flu shot?

## 2013-08-23 NOTE — Telephone Encounter (Signed)
Yes, no problem w flu shot

## 2013-08-24 ENCOUNTER — Ambulatory Visit: Payer: Self-pay | Admitting: Hematology and Oncology

## 2013-08-24 ENCOUNTER — Other Ambulatory Visit: Payer: Self-pay | Admitting: Lab

## 2013-08-24 NOTE — Telephone Encounter (Signed)
Informed pt ok to get flu shot per Dr. Bertis Ruddy.  Informed pt we can give it to her on her next office visit 10/31 if she is not able to get it somewhere else before then .Marland Kitchen  She verbalized understanding.

## 2013-08-25 ENCOUNTER — Ambulatory Visit (INDEPENDENT_AMBULATORY_CARE_PROVIDER_SITE_OTHER): Payer: Medicare Other

## 2013-08-25 DIAGNOSIS — Z23 Encounter for immunization: Secondary | ICD-10-CM

## 2013-08-29 ENCOUNTER — Encounter (HOSPITAL_COMMUNITY)
Admission: RE | Admit: 2013-08-29 | Discharge: 2013-08-29 | Disposition: A | Discharge status: Home / Self Care | Payer: Medicare Other | Source: Ambulatory Visit | Attending: Hematology and Oncology | Admitting: Hematology and Oncology

## 2013-08-29 ENCOUNTER — Ambulatory Visit: Payer: Self-pay

## 2013-08-29 DIAGNOSIS — Z8579 Personal history of other malignant neoplasms of lymphoid, hematopoietic and related tissues: Secondary | ICD-10-CM

## 2013-08-29 DIAGNOSIS — M899 Disorder of bone, unspecified: Secondary | ICD-10-CM | POA: Insufficient documentation

## 2013-08-29 DIAGNOSIS — C9000 Multiple myeloma not having achieved remission: Secondary | ICD-10-CM | POA: Insufficient documentation

## 2013-08-29 DIAGNOSIS — J984 Other disorders of lung: Secondary | ICD-10-CM | POA: Insufficient documentation

## 2013-08-29 MED ORDER — FLUDEOXYGLUCOSE F - 18 (FDG) INJECTION
17.8000 | Freq: Once | INTRAVENOUS | Status: AC | PRN
  Administered 2013-08-29: 17.8 via INTRAVENOUS

## 2013-08-31 ENCOUNTER — Telehealth: Payer: Self-pay | Admitting: *Deleted

## 2013-08-31 NOTE — Telephone Encounter (Signed)
Pt called has appt at [facility] w/ Dr. Barbaraann Boys at 3:30 pm today.  She asks if her PET scan and records have been sent.  Called to [facility] and nurse says they can see PET in care everywhere,  Along w/ labs but having trouble seeing office note.  Faxed recent office note to them at fax #[phone] as requested.

## 2013-09-01 ENCOUNTER — Ambulatory Visit (HOSPITAL_BASED_OUTPATIENT_CLINIC_OR_DEPARTMENT_OTHER): Payer: Medicare Other | Admitting: Hematology and Oncology

## 2013-09-01 VITALS — BP 117/81 | HR 86 | Temp 97.0°F | Resp 18 | Ht 64.0 in | Wt 135.3 lb

## 2013-09-01 DIAGNOSIS — G609 Hereditary and idiopathic neuropathy, unspecified: Secondary | ICD-10-CM

## 2013-09-01 DIAGNOSIS — C9002 Multiple myeloma in relapse: Secondary | ICD-10-CM

## 2013-09-01 DIAGNOSIS — M549 Dorsalgia, unspecified: Secondary | ICD-10-CM

## 2013-09-01 DIAGNOSIS — M545 Low back pain: Secondary | ICD-10-CM

## 2013-09-01 DIAGNOSIS — C9000 Multiple myeloma not having achieved remission: Secondary | ICD-10-CM

## 2013-09-01 DIAGNOSIS — F17200 Nicotine dependence, unspecified, uncomplicated: Secondary | ICD-10-CM

## 2013-09-01 NOTE — Progress Notes (Signed)
Appoint for [facility] scheduled for 09/13/13 @ 7:00am; Cytometry notified and instructions given to patient.

## 2013-09-01 NOTE — Progress Notes (Signed)
[facility] OFFICE PROGRESS NOTE  Patient Care Team: Lindley Magnus, MD as PCP - General Eddie Candle as Consulting Physician (Medical Oncology)  DIAGNOSIS: Multiple myeloma, kappa light chain subtype, relapsed disease  SUMMARY OF ONCOLOGIC HISTORY: Initial diagnosis was in September 2007. She had presented initially with a plasmacytoma.   PAST THERAPY:  1. 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 Total of 45 Gy in 25 fractions in March 2008. She developed complication was significant compression of her bone requiring surgery 2. Induction therapy with Revlimid and dexamethasone. She also received Zometa Subsequently, received Velcade and dexamethasone until September 2009.  3. High dose chemotherapy and stem cell transplant at [facility] in September 2009.  Complete response. She has been on active surveillance since that time.  4. Relapsed multiple myeloma in the form of kappa light chain myeloma in June 2012. Salvage chemo: Velcade/Cytoxan/Dexamethasone on 05/05/11 with very good partial response.  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  5. Maintenance therapy with  Velcade SQ weekly, 3 weeks on, 1 week off since Apr 01, 2012.   INTERVAL HISTORY: Kendra Johns 62 y.o. female returns for further followup. She still had intermittent back pain, well-controlled with current pain medication. The patient continued her efforts and try to stop smoking. She denies any recent fever, chills, night sweats or abnormal weight loss The patient denies any recent signs or symptoms of bleeding such as spontaneous epistaxis, hematuria or hematochezia.  I have reviewed the past medical history, past surgical history, social history and family history with the patient and they are unchanged from previous note.  ALLERGIES:  is allergic to codeine and ibuprofen.  MEDICATIONS:  Current Outpatient Prescriptions  Medication Sig  Dispense Refill  . acyclovir (ZOVIRAX) 400 MG tablet Take 800 mg by mouth 2 (two) times daily.       Marland Kitchen albuterol (PROVENTIL HFA;VENTOLIN HFA) 108 (90 BASE) MCG/ACT inhaler Inhale 2 puffs into the lungs every 4 (four) hours as needed for wheezing or shortness of breath.  1 Inhaler  2  . azithromycin (ZITHROMAX) 250 MG tablet As directed  6 tablet  0  . Bisacodyl (LAXATIVE PO) Take by mouth. As needed      . Bortezomib (VELCADE IJ) Inject as directed.      . diazepam (VALIUM) 5 MG tablet Take 1 tablet (5 mg total) by mouth as needed.  20 tablet  3  . Magnesium Hydroxide (MILK OF MAGNESIA PO) Take by mouth daily as needed.        Marland Kitchen morphine (MS CONTIN) 100 MG 12 hr tablet Take 100 mg by mouth 2 (two) times daily. Take 1 tablet every 12 hours      . morphine (MSIR) 15 MG tablet Take 30 mg by mouth every 4 (four) hours as needed. pain      . polyethylene glycol (MIRALAX / GLYCOLAX) packet Take 17 g by mouth every other day.        . prochlorperazine (COMPAZINE) 10 MG tablet Take 10 mg by mouth every 6 (six) hours as needed. nausea      . [DISCONTINUED] amLODipine (NORVASC) 5 MG tablet Take 1 tablet (5 mg total) by mouth daily.  90 tablet  3  . [DISCONTINUED] gabapentin (NEURONTIN) 600 MG tablet Take 600 mg by mouth 3 (three) times daily.        . [DISCONTINUED] lisinopril (PRINIVIL,ZESTRIL) 20 MG tablet Take 1 tablet (20 mg total) by mouth daily.  90  tablet  11   No current facility-administered medications for this visit.    REVIEW OF SYSTEMS:   Neurological:Denies numbness, tingling or new weaknesses Behavioral/Psych: Mood is stable, no new changes although the patient is disappointed after talking to her other provider All other systems were reviewed with the patient and are negative.  PHYSICAL EXAMINATION: ECOG PERFORMANCE STATUS: 1 - Symptomatic but completely ambulatory  Filed Vitals:   09/01/13 0947  BP: 117/81  Pulse: 86  Temp: 97 F (36.1 C)  Resp: 18   Filed Weights    09/01/13 0947  Weight: 135 lb 4.8 oz (61.372 kg)    GENERAL:alert, no distress and comfortable Musculoskeletal:no cyanosis of digits and no clubbing  NEURO: alert & oriented x 3 with fluent speech, no focal motor/sensory deficits  LABORATORY DATA:  I have reviewed the data as listed    Component Value Date/Time   NA 139 08/07/2013 1150   NA 138 09/19/2012 1350   K 3.9 08/07/2013 1150   K 4.2 09/19/2012 1350   CL 107 03/10/2013 1014   CL 103 09/19/2012 1350   CO2 24 08/07/2013 1150   CO2 27 09/19/2012 1350   GLUCOSE 94 08/07/2013 1150   GLUCOSE 111* 03/10/2013 1014   GLUCOSE 95 09/19/2012 1350   GLUCOSE 98 08/31/2006 1024   BUN 13.3 08/07/2013 1150   BUN 13 09/19/2012 1350   CREATININE 0.8 08/07/2013 1150   CREATININE 0.70 09/19/2012 1350   CALCIUM 9.8 08/07/2013 1150   CALCIUM 9.0 09/19/2012 1350   PROT 7.5 08/07/2013 1150   PROT 7.2 09/19/2012 1350   ALBUMIN 3.7 08/07/2013 1150   ALBUMIN 3.6 09/19/2012 1350   AST 47* 08/07/2013 1150   AST 58* 09/19/2012 1350   ALT 47 08/07/2013 1150   ALT 51* 09/19/2012 1350   ALKPHOS 84 08/07/2013 1150   ALKPHOS 80 09/19/2012 1350   BILITOT 0.37 08/07/2013 1150   BILITOT 0.3 09/19/2012 1350   GFRNONAA >90 09/19/2012 1350   GFRAA >90 09/19/2012 1350    No results found for this basename: SPEP,  UPEP,   kappa and lambda light chains    Lab Results  Component Value Date   WBC 7.6 08/07/2013   NEUTROABS 3.7 08/07/2013   HGB 14.2 08/07/2013   HCT 42.3 08/07/2013   MCV 93.5 08/07/2013   PLT 170 08/07/2013      Chemistry      Component Value Date/Time   NA 139 08/07/2013 1150   NA 138 09/19/2012 1350   K 3.9 08/07/2013 1150   K 4.2 09/19/2012 1350   CL 107 03/10/2013 1014   CL 103 09/19/2012 1350   CO2 24 08/07/2013 1150   CO2 27 09/19/2012 1350   BUN 13.3 08/07/2013 1150   BUN 13 09/19/2012 1350   CREATININE 0.8 08/07/2013 1150   CREATININE 0.70 09/19/2012 1350      Component Value Date/Time   CALCIUM 9.8 08/07/2013 1150   CALCIUM 9.0  09/19/2012 1350   ALKPHOS 84 08/07/2013 1150   ALKPHOS 80 09/19/2012 1350   AST 47* 08/07/2013 1150   AST 58* 09/19/2012 1350   ALT 47 08/07/2013 1150   ALT 51* 09/19/2012 1350   BILITOT 0.37 08/07/2013 1150   BILITOT 0.3 09/19/2012 1350     RADIOGRAPHIC STUDIES: I have personally reviewed the radiological images as listed and agreed with the findings in the report. Her most recent PET/CT scan showed diffuse metastatic disease throughout her bones with no evidence of any bleeding fracture  ASSESSMENT:  Multiple myeloma, kappa light chain subtype, relapsed disease  PLAN:  #1 multiple myeloma, recurrence of disease I recommend the patient to consider second opinion. The patient is in agreement to be referred to [facility]. I recommend bone marrow aspirate and biopsy to restage her. I will go ahead and order that to be done under sedation.  We discussed a palliative treatment and for recurrence of disease. We can give a trial of Carfilzomib since the patient relapsed on Velcade. I recommend the patient to talk to a bone marrow transplant physician first before we proceed with treatments. #2 back pain This is due to her disease. She continues taking her pain medicine. I recommend she take calcium and vitamin D #3 very mild peripheral neuropathy This is due to Velcade. Would continue conservative management with Neurontin #4 smoking The patient is attempting to quit smoking.  No orders of the defined types were placed in this encounter.   All questions were answered. The patient knows to call the clinic with any problems, questions or concerns. No barriers to learning was detected. I spent 40 minutes counseling the patient face to face. The total time spent in the appointment was 60 minutes and more than 50% was on counseling and review of test results     [facility], Marbella Markgraf, MD 09/01/2013 9:58 AM

## 2013-09-04 ENCOUNTER — Other Ambulatory Visit: Payer: Self-pay | Admitting: Hematology and Oncology

## 2013-09-04 DIAGNOSIS — C9002 Multiple myeloma in relapse: Secondary | ICD-10-CM

## 2013-09-04 MED ORDER — MORPHINE SULFATE 10 MG/ML IJ SOLN: 10.0000 mg | INTRAMUSCULAR | Status: DC | PRN

## 2013-09-04 MED ORDER — MIDAZOLAM HCL 10 MG/2ML IJ SOLN: 10.0000 mg | Freq: Once | INTRAMUSCULAR | Status: DC

## 2013-09-04 NOTE — Addendum Note (Signed)
Addended by: Mardi Mainland on: 09/04/2013 01:04 PM   Modules accepted: Orders

## 2013-09-05 ENCOUNTER — Telehealth: Payer: Self-pay | Admitting: Hematology and Oncology

## 2013-09-05 NOTE — Telephone Encounter (Signed)
Pt appt. With Dr. Alphonsa Gin at [facility] is 09/21/13@9 :45.  Medical records faxed,slides and scans will be fedex'ed. Pt is aware

## 2013-09-07 ENCOUNTER — Encounter (HOSPITAL_COMMUNITY): Payer: Self-pay | Admitting: Pharmacy Technician

## 2013-09-08 ENCOUNTER — Other Ambulatory Visit: Payer: Self-pay | Admitting: Hematology and Oncology

## 2013-09-08 DIAGNOSIS — C9002 Multiple myeloma in relapse: Secondary | ICD-10-CM

## 2013-09-13 ENCOUNTER — Other Ambulatory Visit: Payer: Self-pay | Admitting: Hematology and Oncology

## 2013-09-13 ENCOUNTER — Ambulatory Visit (HOSPITAL_COMMUNITY)
Admission: RE | Admit: 2013-09-13 | Discharge: 2013-09-13 | Disposition: A | Discharge status: Home / Self Care | Payer: Medicare Other | Source: Ambulatory Visit | Attending: Hematology and Oncology | Admitting: Hematology and Oncology

## 2013-09-13 ENCOUNTER — Other Ambulatory Visit (HOSPITAL_COMMUNITY): Payer: Self-pay | Admitting: Hematology and Oncology

## 2013-09-13 ENCOUNTER — Encounter (HOSPITAL_COMMUNITY): Payer: Self-pay

## 2013-09-13 VITALS — BP 121/71 | HR 72 | Temp 98.1°F | Resp 18 | Ht 64.0 in | Wt 135.0 lb

## 2013-09-13 DIAGNOSIS — C9002 Multiple myeloma in relapse: Secondary | ICD-10-CM

## 2013-09-13 LAB — CBC WITH DIFFERENTIAL/PLATELET
Basophils Absolute: 0 10*3/uL (ref 0.0–0.1)
Basophils Relative: 0 % (ref 0–1)
HCT: 43.1 % (ref 36.0–46.0)
Lymphocytes Relative: 39 % (ref 12–46)
MCHC: 33.9 g/dL (ref 30.0–36.0)
Monocytes Absolute: 0.5 10*3/uL (ref 0.1–1.0)
Neutro Abs: 4.1 10*3/uL (ref 1.7–7.7)
Neutrophils Relative %: 52 % (ref 43–77)
Platelets: 189 10*3/uL (ref 150–400)
RDW: 13.1 % (ref 11.5–15.5)
WBC: 7.8 10*3/uL (ref 4.0–10.5)

## 2013-09-13 LAB — BONE MARROW EXAM

## 2013-09-13 MED ORDER — SODIUM CHLORIDE 0.9 % IV SOLN
INTRAVENOUS | Status: DC
  Administered 2013-09-13: 07:00:00 via INTRAVENOUS

## 2013-09-13 MED ORDER — MIDAZOLAM HCL 5 MG/5ML IJ SOLN
INTRAMUSCULAR | Status: AC | PRN
  Administered 2013-09-13: 2 mg via INTRAVENOUS
  Administered 2013-09-13: 4 mg via INTRAVENOUS

## 2013-09-13 MED ORDER — MIDAZOLAM HCL 10 MG/2ML IJ SOLN
10.0000 mg | Freq: Once | INTRAMUSCULAR | Status: DC
  Filled 2013-09-13: qty 2

## 2013-09-13 MED ORDER — MIDAZOLAM HCL 10 MG/2ML IJ SOLN: 10.0000 mg | Freq: Once | INTRAMUSCULAR | Status: DC

## 2013-09-13 MED ORDER — MORPHINE SULFATE 10 MG/ML IJ SOLN: 10.0000 mg | INTRAMUSCULAR | Status: DC | PRN

## 2013-09-13 MED ORDER — MORPHINE SULFATE 10 MG/ML IJ SOLN
INTRAMUSCULAR | Status: AC | PRN
  Administered 2013-09-13: 4 mg via INTRAVENOUS
  Administered 2013-09-13: 2 mg via INTRAVENOUS

## 2013-09-13 MED ORDER — MORPHINE SULFATE 10 MG/ML IJ SOLN
10.0000 mg | Freq: Once | INTRAMUSCULAR | Status: DC
Start: 2013-09-13 — End: 2013-09-14
  Filled 2013-09-13: qty 1

## 2013-09-13 NOTE — Procedures (Signed)
Brief examination was performed. ENT: adequate airway clearance Heart: regular rate and rhythm.No Murmurs Lungs: clear to auscultation, no wheezes, normal respiratory effort  Bone Marrow Biopsy and Aspiration Procedure Note   Informed consent was obtained and potential risks including bleeding, infection and pain were reviewed with the patient. I verified that the patient has been fasting since midnight.  The patient's name, date of birth, identification, consent and allergies were verified prior to the start of procedure and time out was performed.  A total of 6 mg of Versed and 6 mg of IV morphine were given.  The left posterior iliac crest was chosen as the site of biopsy.  The skin was prepped with Betadine solution.   9 cc of 2% lidocaine was used to provide local anaesthesia.   10 cc of bone marrow aspirate was obtained followed by 1 inch biopsy.   The procedure was tolerated well and there were no complications.  The patient was stable at the end of the procedure.  Specimens sent for flow cytometry, cytogenetics and additional studies.

## 2013-09-14 ENCOUNTER — Telehealth: Payer: Self-pay | Admitting: Hematology and Oncology

## 2013-09-14 NOTE — Telephone Encounter (Signed)
s.w. pt and advised on 11.21.14 appt...pt ok adn aware

## 2013-09-19 LAB — TISSUE HYBRIDIZATION (BONE MARROW)-NCBH

## 2013-09-19 LAB — CHROMOSOME ANALYSIS, BONE MARROW

## 2013-09-22 ENCOUNTER — Encounter: Payer: Self-pay | Admitting: Hematology and Oncology

## 2013-09-22 ENCOUNTER — Ambulatory Visit (HOSPITAL_BASED_OUTPATIENT_CLINIC_OR_DEPARTMENT_OTHER): Payer: Medicare Other | Admitting: Hematology and Oncology

## 2013-09-22 ENCOUNTER — Other Ambulatory Visit: Payer: Self-pay | Admitting: Hematology and Oncology

## 2013-09-22 ENCOUNTER — Telehealth: Payer: Self-pay | Admitting: Hematology and Oncology

## 2013-09-22 VITALS — BP 123/83 | HR 95 | Temp 97.9°F | Resp 20 | Ht 64.0 in | Wt 134.3 lb

## 2013-09-22 DIAGNOSIS — E559 Vitamin D deficiency, unspecified: Secondary | ICD-10-CM

## 2013-09-22 DIAGNOSIS — C9002 Multiple myeloma in relapse: Secondary | ICD-10-CM

## 2013-09-22 DIAGNOSIS — F17200 Nicotine dependence, unspecified, uncomplicated: Secondary | ICD-10-CM

## 2013-09-22 DIAGNOSIS — C9000 Multiple myeloma not having achieved remission: Secondary | ICD-10-CM

## 2013-09-22 HISTORY — DX: Vitamin D deficiency, unspecified: E55.9

## 2013-09-22 MED ORDER — DEXAMETHASONE 4 MG PO TABS: 8.0000 mg | ORAL_TABLET | ORAL | Status: DC

## 2013-09-22 MED ORDER — VITAMIN D-3 125 MCG (5000 UT) PO TABS: 5000.0000 [IU] | ORAL_TABLET | Freq: Every day | ORAL | Status: DC

## 2013-09-22 MED ORDER — ACYCLOVIR 400 MG PO TABS: 400.0000 mg | ORAL_TABLET | Freq: Every day | ORAL | Status: DC

## 2013-09-22 MED ORDER — ONDANSETRON HCL 8 MG PO TABS: 8.0000 mg | ORAL_TABLET | Freq: Two times a day (BID) | ORAL | Status: DC

## 2013-09-22 NOTE — Progress Notes (Signed)
[facility] OFFICE PROGRESS NOTE  Patient Care Team: Lindley Magnus, MD as PCP - General Eddie Candle as Consulting Physician (Medical Oncology)  DIAGNOSIS: Multiple myeloma, kappa light chains subtype, relapsed disease  SUMMARY OF ONCOLOGIC HISTORY: Initial diagnosis was in September 2007. She had presented initially with a plasmacytoma.   PAST THERAPY:  1. 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 Total of 45 Gy in 25 fractions in March 2008. She developed complication was significant compression of her bone requiring surgery 2. Induction therapy with Revlimid and dexamethasone. She also received Zometa Subsequently, received Velcade and dexamethasone until September 2009.  3. High dose chemotherapy and stem cell transplant at [facility] in September 2009.  Complete response. She has been on active surveillance since that time.  4. Relapsed multiple myeloma in the form of kappa light chain myeloma in June 2012. Salvage chemo: Velcade/Cytoxan/Dexamethasone on 05/05/11 with very good partial response.  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  5. Maintenance therapy with  Velcade SQ weekly, 3 weeks on, 1 week off since Apr 01, 2012.  6. October 2014, treatment was stopped. PET/CT scan and bone marrow aspirate and biopsy show relapsed disease.  INTERVAL HISTORY: Kendra Johns 62 y.o. female returns for further followup. She twisted her back 3 days ago and developed new pain in the right rib cage area. Denies any new back pain. She continue to try to quit smoking. She is down to 1-2 cigarettes per day. She denies any recent fever, chills, night sweats or abnormal weight loss She went for second opinion recently and saw Dr. Barbaraann Boys for second opinion. She was recommended palliative chemotherapy before consideration for second transplant. She denies any toothache. She had poor dentition and has not seen a dentist  recently. I have reviewed the past medical history, past surgical history, social history and family history with the patient and they are unchanged from previous note.  ALLERGIES:  is allergic to codeine and ibuprofen.  MEDICATIONS:  Current Outpatient Prescriptions  Medication Sig Dispense Refill  . diazepam (VALIUM) 5 MG tablet Take 5 mg by mouth every 12 (twelve) hours as needed for anxiety.      Marland Kitchen lisinopril (PRINIVIL,ZESTRIL) 20 MG tablet Take 20 mg by mouth every evening.      . Magnesium Hydroxide (MILK OF MAGNESIA PO) Take 30 mLs by mouth daily as needed (constipation).       . morphine (MS CONTIN) 100 MG 12 hr tablet Take 100 mg by mouth every 12 (twelve) hours.      Marland Kitchen morphine (MSIR) 30 MG tablet Take 30 mg by mouth every 4 (four) hours as needed for severe pain.      Marland Kitchen prochlorperazine (COMPAZINE) 10 MG tablet Take 10 mg by mouth every 6 (six) hours as needed. nausea      . Cholecalciferol (VITAMIN D-3) 5000 UNITS TABS Take 5,000 Units by mouth daily.  90 tablet  3  . dexamethasone (DECADRON) 4 MG tablet Take 2 tablets (8 mg total) by mouth once a week.  30 tablet  1  . [DISCONTINUED] amLODipine (NORVASC) 5 MG tablet Take 1 tablet (5 mg total) by mouth daily.  90 tablet  3  . [DISCONTINUED] gabapentin (NEURONTIN) 600 MG tablet Take 600 mg by mouth 3 (three) times daily.         No current facility-administered medications for this visit.    REVIEW OF SYSTEMS:   Constitutional: Denies fevers, chills or abnormal  weight loss Eyes: Denies blurriness of vision Ears, nose, mouth, throat, and face: Denies mucositis or sore throat Respiratory: Denies cough, dyspnea or wheezes Cardiovascular: Denies palpitation, chest discomfort or lower extremity swelling Gastrointestinal:  Denies nausea, heartburn or change in bowel habits Skin: Denies abnormal skin rashes Lymphatics: Denies new lymphadenopathy or easy bruising Neurological:Denies numbness, tingling or new  weaknesses Behavioral/Psych: Mood is stable, no new changes  All other systems were reviewed with the patient and are negative.  PHYSICAL EXAMINATION: ECOG PERFORMANCE STATUS: 1 - Symptomatic but completely ambulatory  Filed Vitals:   09/22/13 1220  BP: 123/83  Pulse: 95  Temp: 97.9 F (36.6 C)  Resp: 20   Filed Weights   09/22/13 1220  Weight: 134 lb 4.8 oz (60.918 kg)    GENERAL:alert, no distress and comfortable NEURO: alert & oriented x 3 with fluent speech, no focal motor/sensory deficits  LABORATORY DATA:  I have reviewed the data as listed    Component Value Date/Time   NA 139 08/07/2013 1150   NA 138 09/19/2012 1350   K 3.9 08/07/2013 1150   K 4.2 09/19/2012 1350   CL 107 03/10/2013 1014   CL 103 09/19/2012 1350   CO2 24 08/07/2013 1150   CO2 27 09/19/2012 1350   GLUCOSE 94 08/07/2013 1150   GLUCOSE 111* 03/10/2013 1014   GLUCOSE 95 09/19/2012 1350   GLUCOSE 98 08/31/2006 1024   BUN 13.3 08/07/2013 1150   BUN 13 09/19/2012 1350   CREATININE 0.8 08/07/2013 1150   CREATININE 0.70 09/19/2012 1350   CALCIUM 9.8 08/07/2013 1150   CALCIUM 9.0 09/19/2012 1350   PROT 7.5 08/07/2013 1150   PROT 7.2 09/19/2012 1350   ALBUMIN 3.7 08/07/2013 1150   ALBUMIN 3.6 09/19/2012 1350   AST 47* 08/07/2013 1150   AST 58* 09/19/2012 1350   ALT 47 08/07/2013 1150   ALT 51* 09/19/2012 1350   ALKPHOS 84 08/07/2013 1150   ALKPHOS 80 09/19/2012 1350   BILITOT 0.37 08/07/2013 1150   BILITOT 0.3 09/19/2012 1350   GFRNONAA >90 09/19/2012 1350   GFRAA >90 09/19/2012 1350    No results found for this basename: SPEP,  UPEP,   kappa and lambda light chains    Lab Results  Component Value Date   WBC 7.8 09/13/2013   NEUTROABS 4.1 09/13/2013   HGB 14.6 09/13/2013   HCT 43.1 09/13/2013   MCV 92.7 09/13/2013   PLT 189 09/13/2013      Chemistry      Component Value Date/Time   NA 139 08/07/2013 1150   NA 138 09/19/2012 1350   K 3.9 08/07/2013 1150   K 4.2 09/19/2012 1350   CL 107  03/10/2013 1014   CL 103 09/19/2012 1350   CO2 24 08/07/2013 1150   CO2 27 09/19/2012 1350   BUN 13.3 08/07/2013 1150   BUN 13 09/19/2012 1350   CREATININE 0.8 08/07/2013 1150   CREATININE 0.70 09/19/2012 1350      Component Value Date/Time   CALCIUM 9.8 08/07/2013 1150   CALCIUM 9.0 09/19/2012 1350   ALKPHOS 84 08/07/2013 1150   ALKPHOS 80 09/19/2012 1350   AST 47* 08/07/2013 1150   AST 58* 09/19/2012 1350   ALT 47 08/07/2013 1150   ALT 51* 09/19/2012 1350   BILITOT 0.37 08/07/2013 1150   BILITOT 0.3 09/19/2012 1350     RADIOGRAPHIC STUDIES: I have personally reviewed the radiological images as listed and agreed with the findings in the report. PET CT scan  show significant abnormal uptake especially with the new lesion on the right rib cage area, corresponding to areas of increased bone pain.  Bone marrow aspirate and biopsy is consistent with relapsed disease ASSESSMENT & PLAN:   #1 relapsed multiple myeloma This patient has been treated by radiation, Revlimid and dexamethasone & Zometa, Velcade and dexamethasone, high-dose chemotherapy with stem cell transplant, Velcade/Cytoxan/dexamethasone followed by another stem cell transplant. She recently relapsed while on maintenance therapy with Velcade.  I discussed with the patient about choice for further palliative chemotherapy. My preference would be to try treatment with Carfilzomib. We discussed the role of chemotherapy. The intent is for palliative.  We discussed some of the risks, benefits, side-effects of Carfilzomib.   Some of the short term side-effects included, though not limited to, risk of fatigue, weight loss, pancytopenia, life-threatening infections, skin rash, allergic reactions, need for transfusions of blood products, reactivation of viral infections, nausea, vomiting, change in bowel habits, admission to hospital for various reasons, and risks of death.   Long term side-effects are also discussed including risks of  infertility, permanent damage to nerve function, chronic fatigue.   The patient is aware that the response rates discussed earlier is not guaranteed.    After a long discussion, patient made an informed decision to proceed with the prescribed plan of care and went ahead to sign the consent form today.   Patient education material was dispensed.  I recommend the patient to take Dexamethasone 8 mg once a week for now until she starts chemotherapy.  #2 bone pain She is on high-dose pain medications right now. Her pain remains not under control. I will refer her to radiation oncology Department for palliative radiation therapy and start her on chemotherapy on second week of December Hopefully with the moderate dose of dexamethasone weekly, that might help with her bone pain is well.  #3 bone lesions Ideally, I would like to start her back on IV bisphosphonates. The patient had very poor dentition. I recommend she proceed with evaluation by a dental surgeon before we proceed with intravenous bisphosphonates.  #4 venous access We will consider placement of Infuse-a-Port in the future. Did to many active issues, the presence like to defer placement of Infuse-a-Port to a later date.  #5 smoking I congratulated her efforts with nicotine cessation and continue to encourage her to quit smoking. Orders Placed This Encounter  Procedures  . Comprehensive metabolic panel    Standing Status: Standing     Number of Occurrences: 9     Standing Expiration Date: 09/22/2014  . CBC with Differential    Standing Status: Standing     Number of Occurrences: 9     Standing Expiration Date: 09/22/2014  . SPEP & IFE with QIG    Standing Status: Future     Number of Occurrences:      Standing Expiration Date: 09/22/2014  . Kappa/lambda light chains    Standing Status: Future     Number of Occurrences:      Standing Expiration Date: 09/22/2014  . Beta 2 microglobuline, serum    Standing Status: Future      Number of Occurrences:      Standing Expiration Date: 09/22/2014  . Ambulatory referral to Radiation Oncology    Referral Priority:  Routine    Referral Type:  Consultation    Referral Reason:  Specialty Services Required    Requested Specialty:  Radiation Oncology    Number of Visits Requested:  1   All  questions were answered. The patient knows to call the clinic with any problems, questions or concerns. No barriers to learning was detected.    Yaremi Stahlman, MD 09/22/2013 1:10 PM

## 2013-09-22 NOTE — Patient Instructions (Signed)
Carfilzomib injection What is this medicine? CARFILZOMIB (kar FILZ oh mib) is a chemotherapy drug that works by slowing or stopping cancer cell growth. This medicine is used to treat multiple myeloma. This medicine may be used for other purposes; ask your health care provider or pharmacist if you have questions. COMMON BRAND NAME(S): KYPROLIS What should I tell my health care provider before I take this medicine? They need to know if you have any of these conditions: -heart disease -irregular heartbeat -liver disease -lung or breathing disease -an unusual or allergic reaction to carfilzomib, or other medicines, foods, dyes, or preservatives -pregnant or trying to get pregnant -breast-feeding How should I use this medicine? This medicine is for injection or infusion into a vein. It is given by a health care professional in a hospital or clinic setting. Talk to your pediatrician regarding the use of this medicine in children. Special care may be needed. Overdosage: If you think you've taken too much of this medicine contact a poison control center or emergency room at once. Overdosage: If you think you have taken too much of this medicine contact a poison control center or emergency room at once. NOTE: This medicine is only for you. Do not share this medicine with others. What if I miss a dose? It is important not to miss your dose. Call your doctor or health care professional if you are unable to keep an appointment. What may interact with this medicine? Interactions are not expected. Give your health care provider a list of all the medicines, herbs, non-prescription drugs, or dietary supplements you use. Also tell them if you smoke, drink alcohol, or use illegal drugs. Some items may interact with your medicine. This list may not describe all possible interactions. Give your health care provider a list of all the medicines, herbs, non-prescription drugs, or dietary supplements you use. Also  tell them if you smoke, drink alcohol, or use illegal drugs. Some items may interact with your medicine. What should I watch for while using this medicine? Your condition will be monitored carefully while you are receiving this medicine. Report any side effects. Continue your course of treatment even though you feel ill unless your doctor tells you to stop. Call your doctor or health care professional for advice if you get a fever, chills or sore throat, or other symptoms of a cold or flu. Do not treat yourself. Try to avoid being around people who are sick. Do not become pregnant while taking this medicine. Women should inform their doctor if they wish to become pregnant or think they might be pregnant. There is a potential for serious side effects to an unborn child. Talk to your health care professional or pharmacist for more information. Do not breast-feed an infant while taking this medicine. Check with your doctor or health care professional if you get an attack of severe diarrhea, nausea and vomiting, or if you sweat a lot. The loss of too much body fluid can make it dangerous for you to take this medicine. You may get dizzy. Do not drive, use machinery, or do anything that needs mental alertness until you know how this medicine affects you. Do not stand or sit up quickly, especially if you are an older patient. This reduces the risk of dizzy or fainting spells. What side effects may I notice from receiving this medicine? Side effects that you should report to your doctor or health care professional as soon as possible: -allergic reactions like skin rash, itching or hives,  swelling of the face, lips, or tongue -breathing problems -chest pain or palpitationschest tightness -cough -dark urine -dizziness -feeling faint or lightheaded -fever or chills -general ill feeling or flu-like symptoms -light-colored stools -palpitations -right upper belly pain -swelling of the legs or ankles -unusual  bleeding or bruising -unusually weak or tired -yellowing of the eyes or skin  Side effects that usually do not require medical attention (Report these to your doctor or health care professional if they continue or are bothersome.): -diarrhea -headache -nausea, vomiting -tiredness This list may not describe all possible side effects. Call your doctor for medical advice about side effects. You may report side effects to FDA at 1-800-FDA-1088. Where should I keep my medicine? This drug is given in a hospital or clinic and will not be stored at home. NOTE: This sheet is a summary. It may not cover all possible information. If you have questions about this medicine, talk to your doctor, pharmacist, or health care provider.  2014, Elsevier/Gold Standard. (2012-04-08 17:02:29) Pomalidomide oral capsules What is this medicine? POMALIDOMIDE (pom a LID oh mide) is a chemotherapy drug used to treat multiple myeloma. It targets specific proteins within cancer cells and stops the cancer cell from growing. This medicine may be used for other purposes; ask your health care provider or pharmacist if you have questions. COMMON BRAND NAME(S): POMALYST What should I tell my health care provider before I take this medicine? They need to know if you have any of these conditions: -history of blood clots -irregular monthly periods or menstrual cycles -an unusual or allergic reaction to pomalidomide, other medicines, foods, dyes, or preservatives -pregnant or trying to get pregnant -breast-feeding How should I use this medicine? Take this medicine by mouth with a glass of water. Follow the directions on the prescription label. Take this medicine on an empty stomach, at least 2 hours before or 2 hours after a meal. Do not take with food. Do not cut, crush, or chew this medicine. Take your medicine at regular intervals. Do not take it more often than directed. Do not stop taking except on your doctor's advice. A  special MedGuide will be given to you by the pharmacist with each prescription and refill. Be sure to read this information carefully each time. Talk to your pediatrician regarding the use of this medicine in children. Special care may be needed. Overdosage: If you think you've taken too much of this medicine contact a poison control center or emergency room at once. Overdosage: If you think you have taken too much of this medicine contact a poison control center or emergency room at once. NOTE: This medicine is only for you. Do not share this medicine with others. What if I miss a dose? If you miss a dose, take it as soon as you can. If your next dose is to be taken in less than 12 hours, then do not take the missed dose. Take the next dose at your regular time. Do not take double or extra doses. What may interact with this medicine? This medicine may interact with the following medications: -amprenavir -boceprevir -carbamazepine -dalfopristin; quinupristin -delavirdine -enzalutamide -fosamprenavir -indinavir -isoniazid, INH -itraconazole -ketoconazole -nicardipine -rifampin -ritonavir -St. John's Wort, Hypericum perforatum -telaprevir -telithromycin -thiabendazole -tipranavir -tobacco (cigarettes) This list may not describe all possible interactions. Give your health care provider a list of all the medicines, herbs, non-prescription drugs, or dietary supplements you use. Also tell them if you smoke, drink alcohol, or use illegal drugs. Some items may interact  with your medicine. What should I watch for while using this medicine? Visit your doctor for regular check ups. Tell your doctor or healthcare professional if your symptoms do not start to get better or if they get worse. You will need to have important blood work done while you are taking this medicine. This medicine is available only through a special program. Doctors, pharmacies, and patients must meet all of the conditions  of the program. Your health care provider will help you get signed up with the program if you need this medicine. Through the program you will only receive up to a 28 day supply of the medicine at one time. You will need a new prescription for each refill. This medicine can cause birth defects. Do not get pregnant while taking this drug. Females with child-bearing potential will need to have 2 negative pregnancy tests before starting this medicine. Pregnancy testing must be done every 2 to 4 weeks as directed while taking this medicine. Use 2 reliable forms of birth control together while you are taking this medicine and for 1 month after you stop taking this medicine. If you think that you might be pregnant talk to your doctor right away. Men must use a latex condom during sexual contact with a woman while taking this medicine and for 28 days after you stop taking this medicine. A latex condom is needed even if you have had a vasectomy. Contact your doctor right away if your partner becomes pregnant. Do not donate sperm while taking this medicine and for 28 days after you stop taking this medicine. Do not give blood while taking the medicine and for 1 month after completion of treatment to avoid exposing pregnant women to the medicine through the donated blood. You may need blood work done while you are taking this medicine. If you are going to have surgery or any other procedures, tell your doctor you are taking this medicine. What side effects may I notice from receiving this medicine? Side effects that you should report to your doctor or health care professional as soon as possible: -allergic reactions like skin rash, itching or hives, swelling of the face, lips, or tongue -bloody or dark, tarry stools -breathing problems -chest pain -confusion -dark urine -fever, infection, runny nose, or sore throat -nausea, vomiting -pain, tingling, numbness in the hands or feet -red spots on the  skin -swelling of your hands, ankles or leg -trouble passing urine or change in the amount of urine -unusual bleeding or bruising  Side effects that usually do not require medical attention (Report these to your doctor or health care professional if they continue or are bothersome.): -constipation -diarrhea -dizziness -headache -joint pain -muscle pain -tiredness -trouble sleeping This list may not describe all possible side effects. Call your doctor for medical advice about side effects. You may report side effects to FDA at 1-800-FDA-1088. Where should I keep my medicine? Keep out of the reach of children. Store between 20 and 25 degrees C (68 and 77 degrees F). Throw away any unused medicine after the expiration date. NOTE: This sheet is a summary. It may not cover all possible information. If you have questions about this medicine, talk to your doctor, pharmacist, or health care provider.  2014, Elsevier/Gold Standard. (2012-02-04 05:58:16)

## 2013-09-22 NOTE — Telephone Encounter (Signed)
gv pt appt schedule for december and appt w/Dr. Mitzi Hansen for 12/1. per 11/21 pof pt needs tx 12/11. no care plan attached to 11/21 pof. per desk nurse ([place]) tx is wkly for 3wks 2days in a row to start 12/11. also per desk nurse pt will need wkly lb w/tx. orders to be added.

## 2013-09-27 ENCOUNTER — Encounter: Payer: Self-pay | Admitting: Radiation Oncology

## 2013-09-27 NOTE — Progress Notes (Signed)
Histology and Location of Primary Cancer: Multiple Myeloma relapse ,right rib cage area  09/13/13:DiagnosisBone Marrow, Aspirate,Biopsy, and Clot- HYPERCELLULAR BONE MAR ROW WITH ATYPICAL PLASMACYTOSIS CONSISTENT WITHPLASMA CELL NEOPLASM. - SEE COMMENT.PERIPHERAL BLOOD:- ATYPICAL CIRCULATING LYMPHOID CELLS Sites of Visceral and Bony Metastatic Disease: 08/29/13 Pet Scan=Widespread hypermetabolic lytic lesions throughout the axial and proximal appendicular skeleton consistent with progressive multiple myeloma. There is prominent involvement at T1 and T10.   Location(s) of Symptomatic Metastases:pain right rib cage area  Had twisted her back a few days prior back pain and fatigue   Past/Anticipated chemotherapy by medical oncology, if any: 09/22/13:DIAGNOSIS: Multiple myeloma, kappa light chains subtype, relapsed disease  SUMMARY OF ONCOLOGIC HISTORY:  Initial diagnosis was in September 2007. She had presented initially with a plasmacytoma.  PAST THERAPY:  1. 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 Total of 45 Gy in 25 fractions in March 2008. She developed complication was significant compression of her bone requiring surgery  2. Induction therapy with Revlimid and dexamethasone. She also received Zometa Subsequently, received Velcade and dexamethasone until September 2009.  3. High dose chemotherapy and stem cell transplant at [facility] in September 2009. Complete response. She has been on active surveillance since that time.  4. Relapsed multiple myeloma in the form of kappa light chain myeloma in June 2012. Salvage chemo: Velcade/Cytoxan/Dexamethasone on 05/05/11 with very good partial response. 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  5. Maintenance therapy with Velcade SQ weekly, 3 weeks on, 1 week off since Apr 01, 2012.  6. October 2014, treatment was stopped. PET/CT scan and bone marrow aspirate and biopsy show relapsed disease.She went  for second opinion recently and saw Dr. Barbaraann Boys for second opinion. She was recommended palliative chemotherapy before consideration for second transplant  Dr.Gorsuch note     Pain on a scale of 0-10 is: 10 shest, side area pointing to left side, took morphine 15mg  2pm today  If Spine Met(s), symptoms, if any, include:  Bowel/Bladder retention or incontinence no  Numbness or weakness in extremities (please describe):   Current Decadron regimen, if applicable: yes, 8mg  once weekly since 09/22/13  Ambulatory status? Walker? Wheelchair?: ambulatory, but weak   SAFETY ISSUES:  Prior radiation? Yes, 12/02/2006-01/06/2007 Lumbar spine,4500cGy/25 fractions, Dr.Bednarz,[facility]  Pacemaker/ICD? no  Possible current pregnancy? no  Is the patient on methotrexate? no  Current Complaints / other details: married, 2 children,  Trying to quit smoking, 1-2 cigarettes daily , hx Hep C, has a cold that started Thanksgiving night, wearing mask, no fever, coughing up dark greyish pgeglm

## 2013-09-30 ENCOUNTER — Other Ambulatory Visit: Payer: Medicare Other | Admitting: Oncology

## 2013-10-02 ENCOUNTER — Ambulatory Visit
Admission: RE | Admit: 2013-10-02 | Discharge: 2013-10-02 | Disposition: A | Discharge status: Home / Self Care | Payer: Medicare Other | Source: Ambulatory Visit | Attending: Radiation Oncology | Admitting: Radiation Oncology

## 2013-10-02 ENCOUNTER — Encounter: Payer: Self-pay | Admitting: Radiation Oncology

## 2013-10-02 VITALS — BP 118/74 | HR 90 | Temp 98.4°F | Resp 20 | Ht 64.0 in | Wt 131.2 lb

## 2013-10-02 DIAGNOSIS — R079 Chest pain, unspecified: Secondary | ICD-10-CM | POA: Insufficient documentation

## 2013-10-02 DIAGNOSIS — Z79899 Other long term (current) drug therapy: Secondary | ICD-10-CM | POA: Insufficient documentation

## 2013-10-02 DIAGNOSIS — C9000 Multiple myeloma not having achieved remission: Secondary | ICD-10-CM

## 2013-10-02 DIAGNOSIS — E559 Vitamin D deficiency, unspecified: Secondary | ICD-10-CM | POA: Insufficient documentation

## 2013-10-02 DIAGNOSIS — E785 Hyperlipidemia, unspecified: Secondary | ICD-10-CM | POA: Insufficient documentation

## 2013-10-02 DIAGNOSIS — Z923 Personal history of irradiation: Secondary | ICD-10-CM | POA: Insufficient documentation

## 2013-10-02 DIAGNOSIS — I10 Essential (primary) hypertension: Secondary | ICD-10-CM | POA: Insufficient documentation

## 2013-10-02 HISTORY — DX: Allergy, unspecified, initial encounter: T78.40XA

## 2013-10-02 NOTE — Progress Notes (Signed)
Please see the Nurse Progress Note in the MD Initial Consult Encounter for this patient. 

## 2013-10-03 NOTE — Progress Notes (Signed)
Radiation Oncology         [phone] ________________________________  Name: Kendra Johns MRN: [id number]  Date: 10/02/2013  DOB: [birth year]  WU:[id number],YNWGN Sherilyn Cooter, MD  Artis Delay, MD     REFERRING PHYSICIAN: Artis Delay, MD   DIAGNOSIS: The encounter diagnosis was Multiple myeloma, without mention of having achieved remission(203.00).   HISTORY OF PRESENT ILLNESS::Kendra Johns is a 62 y.o. female who is seen for an initial consultation visit. The patient is seen today regarding her diagnosis of multiple myeloma. She does have a history of external beam radiation treatment which was given for an isolated plasmacytoma in March of 2008. This delivered 45 gray. The patient subsequently has received multiple systemic agents in addition to a high-dose chemotherapy/stem cell transplant at [facility] in September of 2009. Most recently the patient was on maintenance therapy with Velcade which she began in May of 2013.  Patient had a recent PET scan on 1020 13,014. This showed progression of disease involving widespread hypermetabolic lytic lesions throughout the axial and proximal appendicular skeleton consistent with multiple myeloma.  The patient has indicated that she has been experiencing some new pain in the right rib cage region. This appeared to correspond to progressive disease and therefore I been asked to see the patient today for evaluation for possible palliative radiation treatment.  PREVIOUS RADIATION THERAPY: Yes as noted above to 45 gray to the L2-S1 vertebral bodies completed in March of 2008 Dr. Lestine Box   PAST MEDICAL HISTORY:  has a past medical history of Depression; Hypertension; Hepatitis C; Diverticulosis of colon; Multiple myeloma; adenomatous colonic polyps (2007); Anxiety; Osteoarthritis; Sleep apnea; Hyperlipidemia; GERD with stricture; External hemorrhoids; Hepatitis B virus infection; IBS (irritable bowel syndrome); Helicobacter pylori gastritis (2001);  Sciatic nerve pain; Chronic low back pain; Unspecified vitamin D deficiency (09/22/2013); and Allergy.     PAST SURGICAL HISTORY: Past Surgical History  Procedure Laterality Date  . Abdominal hysterectomy    . Oophorectomy    . Dilation and curettage of uterus    . Bone marrow transplant      2009, 2013 [facility]  . Appendectomy    . Colonoscopy w/ polypectomy  08/2006    1-2 adenomas, 6 polyps total, diverticulosis and external hemorrhoids  . Upper gastrointestinal endoscopy  08/2003    esophageal stricture dilation, hiatal hernia, gastrritis  . Cholecystectomy    . Tonsillectomy and adenoidectomy       FAMILY HISTORY: family history includes Alcohol abuse in an other family member; Cirrhosis in her sister; Leukemia in her mother; Lung cancer in her father. There is no history of Colon cancer.   SOCIAL HISTORY:  reports that she has been smoking Cigarettes.  She has a 10 pack-year smoking history. She has never used smokeless tobacco. She reports that she does not drink alcohol or use illicit drugs.   ALLERGIES: Codeine and Ibuprofen   MEDICATIONS:  Current Outpatient Prescriptions  Medication Sig Dispense Refill  . acyclovir (ZOVIRAX) 400 MG tablet Take 1 tablet (400 mg total) by mouth daily.  30 tablet  3  . Cholecalciferol (VITAMIN D-3) 5000 UNITS TABS Take 5,000 Units by mouth daily.  90 tablet  3  . dexamethasone (DECADRON) 4 MG tablet Take 2 tablets (8 mg total) by mouth once a week.  30 tablet  1  . diazepam (VALIUM) 5 MG tablet Take 5 mg by mouth every 12 (twelve) hours as needed for anxiety.      Marland Kitchen lisinopril (PRINIVIL,ZESTRIL) 20 MG tablet Take  20 mg by mouth every evening.      . Magnesium Hydroxide (MILK OF MAGNESIA PO) Take 30 mLs by mouth daily as needed (constipation).       . morphine (MS CONTIN) 100 MG 12 hr tablet Take 100 mg by mouth every 12 (twelve) hours.      Marland Kitchen morphine (MSIR) 30 MG tablet Take 30 mg by mouth every 4 (four) hours as needed for severe pain.       Marland Kitchen ondansetron (ZOFRAN) 8 MG tablet Take 1 tablet (8 mg total) by mouth 2 (two) times daily. Take two times a day starting the day after chemo for 2 days. Then take two times a day as needed for nausea or vomiting.  30 tablet  1  . prochlorperazine (COMPAZINE) 10 MG tablet Take 10 mg by mouth every 6 (six) hours as needed. nausea      . [DISCONTINUED] amLODipine (NORVASC) 5 MG tablet Take 1 tablet (5 mg total) by mouth daily.  90 tablet  3  . [DISCONTINUED] gabapentin (NEURONTIN) 600 MG tablet Take 600 mg by mouth 3 (three) times daily.         No current facility-administered medications for this encounter.     REVIEW OF SYSTEMS:  A 15 point review of systems is documented in the electronic medical record. This was obtained by the nursing staff. However, I reviewed this with the patient to discuss relevant findings and make appropriate changes.  Pertinent items are noted in HPI.    PHYSICAL EXAM:  height is 5\' 4"  (1.626 m) and weight is 131 lb 3.2 oz (59.512 kg). Her temperature is 98.4 F (36.9 C). Her blood pressure is 118/74 and her pulse is 90. Her respiration is 20 and oxygen saturation is 93%.   General: Well-developed, in no acute distress HEENT: Normocephalic, atraumatic Cardiovascular: Regular rate and rhythm Respiratory: Clear to auscultation bilaterally; pain present posteriorly laterally in the mid thoracic region along the rib cage. No tenderness to palpation along the spine. GI: Soft, nontender, normal bowel sounds Extremities: No edema present    LABORATORY DATA:  Lab Results  Component Value Date   WBC 7.8 09/13/2013   HGB 14.6 09/13/2013   HCT 43.1 09/13/2013   MCV 92.7 09/13/2013   PLT 189 09/13/2013   Lab Results  Component Value Date   NA 139 08/07/2013   K 3.9 08/07/2013   CL 107 03/10/2013   CO2 24 08/07/2013   Lab Results  Component Value Date   ALT 47 08/07/2013   AST 47* 08/07/2013   ALKPHOS 84 08/07/2013   BILITOT 0.37 08/07/2013      RADIOGRAPHY:  No results found.     IMPRESSION:  The patient has multiple myeloma and is experiencing significant new pain in the right rib cage region. She does have multifocal progressive osseus disease and the patient does have hypermetabolic activity within the right rib cage. I believe that the patient is an appropriate candidate for palliative radiation and I discussed this with the patient.  I discussed with her the expected/potential benefits of radiation treatment in terms of local control and symptom improvement. Also discussed the possible side effects and risks of treatment. I would expect that the patient wouldn't tolerate the treatment well given the expected timeframe.  The treatment will center on the right rib posteriorly laterally. She does have some significant activity also within the spine most prominently at T10. Although she does not have pain in this area this would be  a possible target site as well which can be further evaluated during the treatment planning process.   PLAN:  The patient will be scheduled for a simulation as soon as possible such that we can begin treatment planning. The patient is in severe pain and we will accelerate her planning as much as possible.    I spent 35 minutes face to face with the patient and more than 50% of that time was spent in counseling and/or coordination of care.    ________________________________   Radene Gunning, MD, PhD

## 2013-10-04 ENCOUNTER — Ambulatory Visit
Admission: RE | Admit: 2013-10-04 | Discharge: 2013-10-04 | Disposition: A | Discharge status: Home / Self Care | Payer: Medicare Other | Source: Ambulatory Visit | Attending: Radiation Oncology | Admitting: Radiation Oncology

## 2013-10-04 DIAGNOSIS — R05 Cough: Secondary | ICD-10-CM | POA: Insufficient documentation

## 2013-10-04 DIAGNOSIS — R059 Cough, unspecified: Secondary | ICD-10-CM | POA: Insufficient documentation

## 2013-10-04 DIAGNOSIS — Z79899 Other long term (current) drug therapy: Secondary | ICD-10-CM | POA: Insufficient documentation

## 2013-10-04 DIAGNOSIS — R071 Chest pain on breathing: Secondary | ICD-10-CM | POA: Insufficient documentation

## 2013-10-04 DIAGNOSIS — C9000 Multiple myeloma not having achieved remission: Secondary | ICD-10-CM

## 2013-10-04 DIAGNOSIS — Z510 Encounter for antineoplastic radiation therapy: Secondary | ICD-10-CM | POA: Insufficient documentation

## 2013-10-04 DIAGNOSIS — R079 Chest pain, unspecified: Secondary | ICD-10-CM | POA: Insufficient documentation

## 2013-10-06 ENCOUNTER — Encounter (HOSPITAL_COMMUNITY): Payer: Self-pay | Admitting: Emergency Medicine

## 2013-10-06 ENCOUNTER — Emergency Department (HOSPITAL_COMMUNITY): Payer: Medicare Other

## 2013-10-06 ENCOUNTER — Emergency Department (HOSPITAL_COMMUNITY)
Admission: EM | Admit: 2013-10-06 | Discharge: 2013-10-06 | Disposition: A | Discharge status: Home / Self Care | Payer: Medicare Other | Attending: Emergency Medicine | Admitting: Emergency Medicine

## 2013-10-06 DIAGNOSIS — R0789 Other chest pain: Secondary | ICD-10-CM

## 2013-10-06 DIAGNOSIS — E559 Vitamin D deficiency, unspecified: Secondary | ICD-10-CM | POA: Insufficient documentation

## 2013-10-06 DIAGNOSIS — I10 Essential (primary) hypertension: Secondary | ICD-10-CM | POA: Insufficient documentation

## 2013-10-06 DIAGNOSIS — M1990 Unspecified osteoarthritis, unspecified site: Secondary | ICD-10-CM | POA: Insufficient documentation

## 2013-10-06 DIAGNOSIS — C9000 Multiple myeloma not having achieved remission: Secondary | ICD-10-CM

## 2013-10-06 DIAGNOSIS — R05 Cough: Secondary | ICD-10-CM | POA: Insufficient documentation

## 2013-10-06 DIAGNOSIS — Z8619 Personal history of other infectious and parasitic diseases: Secondary | ICD-10-CM | POA: Insufficient documentation

## 2013-10-06 DIAGNOSIS — R059 Cough, unspecified: Secondary | ICD-10-CM | POA: Insufficient documentation

## 2013-10-06 DIAGNOSIS — F17200 Nicotine dependence, unspecified, uncomplicated: Secondary | ICD-10-CM | POA: Insufficient documentation

## 2013-10-06 DIAGNOSIS — R071 Chest pain on breathing: Secondary | ICD-10-CM | POA: Insufficient documentation

## 2013-10-06 DIAGNOSIS — Z8669 Personal history of other diseases of the nervous system and sense organs: Secondary | ICD-10-CM | POA: Insufficient documentation

## 2013-10-06 DIAGNOSIS — R0989 Other specified symptoms and signs involving the circulatory and respiratory systems: Secondary | ICD-10-CM | POA: Insufficient documentation

## 2013-10-06 DIAGNOSIS — G8929 Other chronic pain: Secondary | ICD-10-CM | POA: Insufficient documentation

## 2013-10-06 DIAGNOSIS — Z860100 Personal history of colon polyps, unspecified: Secondary | ICD-10-CM | POA: Insufficient documentation

## 2013-10-06 DIAGNOSIS — R937 Abnormal findings on diagnostic imaging of other parts of musculoskeletal system: Secondary | ICD-10-CM | POA: Insufficient documentation

## 2013-10-06 DIAGNOSIS — M899 Disorder of bone, unspecified: Secondary | ICD-10-CM

## 2013-10-06 DIAGNOSIS — Z79899 Other long term (current) drug therapy: Secondary | ICD-10-CM | POA: Insufficient documentation

## 2013-10-06 DIAGNOSIS — M549 Dorsalgia, unspecified: Secondary | ICD-10-CM | POA: Insufficient documentation

## 2013-10-06 DIAGNOSIS — F411 Generalized anxiety disorder: Secondary | ICD-10-CM | POA: Insufficient documentation

## 2013-10-06 DIAGNOSIS — Z8659 Personal history of other mental and behavioral disorders: Secondary | ICD-10-CM | POA: Insufficient documentation

## 2013-10-06 LAB — CBC
HCT: 41.5 % (ref 36.0–46.0)
MCHC: 33.5 g/dL (ref 30.0–36.0)
Platelets: 167 10*3/uL (ref 150–400)
RDW: 13.2 % (ref 11.5–15.5)
WBC: 8.5 10*3/uL (ref 4.0–10.5)

## 2013-10-06 LAB — COMPREHENSIVE METABOLIC PANEL
AST: 16 U/L (ref 0–37)
Albumin: 3.7 g/dL (ref 3.5–5.2)
Alkaline Phosphatase: 100 U/L (ref 39–117)
BUN: 10 mg/dL (ref 6–23)
Chloride: 103 mEq/L (ref 96–112)
Glucose, Bld: 99 mg/dL (ref 70–99)
Potassium: 4.3 mEq/L (ref 3.5–5.1)
Total Bilirubin: 0.3 mg/dL (ref 0.3–1.2)
Total Protein: 7.2 g/dL (ref 6.0–8.3)

## 2013-10-06 MED ORDER — HYDROMORPHONE HCL PF 1 MG/ML IJ SOLN
1.0000 mg | Freq: Once | INTRAMUSCULAR | Status: DC
  Filled 2013-10-06: qty 1

## 2013-10-06 MED ORDER — BENZONATATE 100 MG PO CAPS: 100.0000 mg | ORAL_CAPSULE | Freq: Three times a day (TID) | ORAL | Status: DC | PRN

## 2013-10-06 MED ORDER — ALBUTEROL SULFATE HFA 108 (90 BASE) MCG/ACT IN AERS: 2.0000 | INHALATION_SPRAY | RESPIRATORY_TRACT | Status: DC | PRN

## 2013-10-06 MED ORDER — ALBUTEROL SULFATE (5 MG/ML) 0.5% IN NEBU
5.0000 mg | INHALATION_SOLUTION | Freq: Once | RESPIRATORY_TRACT | Status: AC
  Administered 2013-10-06: 5 mg via RESPIRATORY_TRACT

## 2013-10-06 MED ORDER — HYDROMORPHONE HCL PF 1 MG/ML IJ SOLN
1.0000 mg | Freq: Once | INTRAMUSCULAR | Status: AC
  Administered 2013-10-06: 1 mg via INTRAVENOUS
  Filled 2013-10-06: qty 1

## 2013-10-06 MED ORDER — LEVOFLOXACIN 500 MG PO TABS: 500.0000 mg | ORAL_TABLET | Freq: Every day | ORAL | Status: DC

## 2013-10-06 MED ORDER — LEVOFLOXACIN 500 MG PO TABS
750.0000 mg | ORAL_TABLET | Freq: Once | ORAL | Status: AC
  Administered 2013-10-06: 750 mg via ORAL
  Filled 2013-10-06: qty 2

## 2013-10-06 MED ORDER — SODIUM CHLORIDE 0.9 % IV SOLN
INTRAVENOUS | Status: DC
  Administered 2013-10-06: 19:00:00 via INTRAVENOUS

## 2013-10-06 MED ORDER — IPRATROPIUM BROMIDE 0.02 % IN SOLN
0.5000 mg | Freq: Once | RESPIRATORY_TRACT | Status: AC
  Administered 2013-10-06: 0.5 mg via RESPIRATORY_TRACT
  Filled 2013-10-06: qty 2.5

## 2013-10-06 NOTE — ED Provider Notes (Signed)
CSN: [id number]     Arrival date & time 10/06/13  1810 History   First MD Initiated Contact with Patient 10/06/13 1841     Chief Complaint  Patient presents with  . Rib cage pain    . chest congestion    (Consider location/radiation/quality/duration/timing/severity/associated sxs/prior Treatment) The history is provided by the patient and a relative.  pt with hx multiple myeloma, and severe chronic chest and back pain related to that, c/o worsening of her same pain in the past week associated w productive cough/coughing spells. pts pain is bilateral back/diffuse, esp right, and around side of ribs to lateral chest. Pain constant, dull, severe, worse w coughing spells, change of position, turning. Denies increased sob. No fever or chills. States completed zpack in past week but feels no better. No leg pain or swelling. No hx dvt or pe. Denies hx cad.     Past Medical History  Diagnosis Date  . Depression   . Hypertension   . Hepatitis C     genotype 1  . Diverticulosis of colon   . Multiple myeloma     kappa light chain, s/p high-dose chemo/stem cell tx - [facility]  . Hx of adenomatous colonic polyps 2007  . Anxiety   . Osteoarthritis   . Sleep apnea   . Hyperlipidemia   . GERD with stricture   . External hemorrhoids   . Hepatitis B virus infection   . IBS (irritable bowel syndrome)   . Helicobacter pylori gastritis 2001    ? if treated  . Sciatic nerve pain     with Dr. Delma Officer  . Chronic low back pain   . Unspecified vitamin D deficiency 09/22/2013  . Allergy    Past Surgical History  Procedure Laterality Date  . Abdominal hysterectomy    . Oophorectomy    . Dilation and curettage of uterus    . Bone marrow transplant      2009, 2013 [facility]  . Appendectomy    . Colonoscopy w/ polypectomy  08/2006    1-2 adenomas, 6 polyps total, diverticulosis and external hemorrhoids  . Upper gastrointestinal endoscopy  08/2003    esophageal stricture dilation, hiatal hernia,  gastrritis  . Cholecystectomy    . Tonsillectomy and adenoidectomy     Family History  Problem Relation Age of Onset  . Alcohol abuse    . Lung cancer Father   . Leukemia Mother   . Cirrhosis Sister   . Colon cancer Neg Hx    History  Substance Use Topics  . Smoking status: Current Every Day Smoker -- 0.25 packs/day for 40 years    Types: Cigarettes  . Smokeless tobacco: Never Used     Comment: 8 cigarettes per day or less  . Alcohol Use: No   OB History   Grav Para Term Preterm Abortions TAB SAB Ect Mult Living                 Review of Systems  Constitutional: Negative for fever and chills.  HENT: Negative for sore throat.   Eyes: Negative for redness.  Respiratory: Positive for cough.   Cardiovascular: Positive for chest pain. Negative for leg swelling.  Gastrointestinal: Negative for vomiting and abdominal pain.  Genitourinary: Negative for flank pain.  Musculoskeletal: Positive for back pain. Negative for neck pain.  Skin: Negative for rash.  Neurological: Negative for headaches.  Hematological: Does not bruise/bleed easily.  Psychiatric/Behavioral: Negative for confusion.    Allergies  Codeine and Ibuprofen  Home Medications   Current Outpatient Rx  Name  Route  Sig  Dispense  Refill  . acyclovir (ZOVIRAX) 400 MG tablet   Oral   Take 1 tablet (400 mg total) by mouth daily.   30 tablet   3   . Cholecalciferol (VITAMIN D-3) 5000 UNITS TABS   Oral   Take 5,000 Units by mouth daily.   90 tablet   3     Vitamin D deficiency   . dexamethasone (DECADRON) 4 MG tablet   Oral   Take 2 tablets (8 mg total) by mouth once a week.   30 tablet   1   . diazepam (VALIUM) 5 MG tablet   Oral   Take 5 mg by mouth every 12 (twelve) hours as needed for anxiety.         Marland Kitchen lisinopril (PRINIVIL,ZESTRIL) 20 MG tablet   Oral   Take 20 mg by mouth every evening.         . Magnesium Hydroxide (MILK OF MAGNESIA PO)   Oral   Take 30 mLs by mouth daily as needed  (constipation).          . morphine (MS CONTIN) 100 MG 12 hr tablet   Oral   Take 100 mg by mouth every 12 (twelve) hours.         Marland Kitchen morphine (MSIR) 30 MG tablet   Oral   Take 30 mg by mouth every 4 (four) hours as needed for severe pain.         Marland Kitchen ondansetron (ZOFRAN) 8 MG tablet   Oral   Take 1 tablet (8 mg total) by mouth 2 (two) times daily. Take two times a day starting the day after chemo for 2 days. Then take two times a day as needed for nausea or vomiting.   30 tablet   1   . prochlorperazine (COMPAZINE) 10 MG tablet   Oral   Take 10 mg by mouth every 6 (six) hours as needed. nausea          BP 141/92  Pulse 80  Temp(Src) 98.3 F (36.8 C) (Oral)  Resp 20  SpO2 93% Physical Exam  Nursing note and vitals reviewed. Constitutional: She is oriented to person, place, and time. She appears well-developed and well-nourished. No distress.  HENT:  Mouth/Throat: Oropharynx is clear and moist.  Eyes: Conjunctivae are normal. No scleral icterus.  Neck: Neck supple. No tracheal deviation present.  Cardiovascular: Normal rate, regular rhythm, normal heart sounds and intact distal pulses.   No murmur heard. Pulmonary/Chest: Effort normal and breath sounds normal. No respiratory distress.  Abdominal: Soft. Normal appearance and bowel sounds are normal. She exhibits no distension and no mass. There is no tenderness. There is no rebound and no guarding.  Musculoskeletal: Normal range of motion. She exhibits no edema and no tenderness.  Neurological: She is alert and oriented to person, place, and time.  Skin: Skin is warm and dry. No rash noted. She is not diaphoretic.  Psychiatric: She has a normal mood and affect.    ED Course  Procedures (including critical care time)  Results for orders placed during the hospital encounter of 10/06/13  CBC      Result Value Range   WBC 8.5  4.0 - 10.5 K/uL   RBC 4.47  3.87 - 5.11 MIL/uL   Hemoglobin 13.9  12.0 - 15.0 g/dL   HCT  16.1  09.6 - 04.5 %   MCV 92.8  78.0 -  100.0 fL   MCH 31.1  26.0 - 34.0 pg   MCHC 33.5  30.0 - 36.0 g/dL   RDW 16.1  09.6 - 04.5 %   Platelets 167  150 - 400 K/uL  COMPREHENSIVE METABOLIC PANEL      Result Value Range   Sodium 138  135 - 145 mEq/L   Potassium 4.3  3.5 - 5.1 mEq/L   Chloride 103  96 - 112 mEq/L   CO2 25  19 - 32 mEq/L   Glucose, Bld 99  70 - 99 mg/dL   BUN 10  6 - 23 mg/dL   Creatinine, Ser 4.09  0.50 - 1.10 mg/dL   Calcium 9.8  8.4 - 81.1 mg/dL   Total Protein 7.2  6.0 - 8.3 g/dL   Albumin 3.7  3.5 - 5.2 g/dL   AST 16  0 - 37 U/L   ALT 11  0 - 35 U/L   Alkaline Phosphatase 100  39 - 117 U/L   Total Bilirubin 0.3  0.3 - 1.2 mg/dL   GFR calc non Af Amer >90  >90 mL/min   GFR calc Af Amer >90  >90 mL/min   Dg Chest 2 View  10/06/2013   CLINICAL DATA:  Chest pain and congestion. Wheezing. Multiple myeloma.  EXAM: CHEST  2 VIEW  COMPARISON:  09/19/2012 and PET-CT dated 08/29/2013.  FINDINGS: The heart remains normal in size. No significant change in diffuse peribronchial thickening and accentuation of the interstitial markings. Minimal scoliosis. Diffuse osteopenia. If approximately 30% T6 superior endplate compression deformity with minimal progression.  IMPRESSION: 1. No acute abnormality. 2. Stable chronic bronchitic changes and chronic interstitial lung disease. 3. Diffuse osteopenia with minimally progressive compression deformity of the T6 vertebral body.   Electronically Signed   By: Gordan Payment M.D.   On: 10/06/2013 19:16     EKG Interpretation   None       MDM  Iv ns. Dilaudid iv. Cxr. Labs.  Reviewed nursing notes and prior charts for additional history.   Recheck pain improved but not resolved.  Dilaudid 1 mg iv.  Recheck pt much more comfortable. Breathing comfortable.  Given chest congestion on exam, persistent cough, suspect possible developing/early pna.  levaquin iv.  Recheck pt comfortable, breathing comfortably, and appears stable for  d/c.  Pt states has pain management provider, so cant take any additional pain med rx for home - discuss w pt to contact pcp and pain management provider tomorrow to facilitate close follow up.      Suzi Roots, MD 10/06/13 2158

## 2013-10-06 NOTE — ED Notes (Addendum)
Pt c/o "having a cold, mucus collecting in my lungs, and rib cage pain that is off the chart."   Pain score 10/10.  Hx of multiple myeloma.  Sts "because of my cancer, I can't cough anything up.  My doctor put me on Mucinex and it helped a little bit."    Pt is concerned because she recently had pins placed in preparation for radiation.

## 2013-10-09 ENCOUNTER — Ambulatory Visit
Admission: RE | Admit: 2013-10-09 | Discharge: 2013-10-09 | Disposition: A | Discharge status: Home / Self Care | Payer: Medicare Other | Source: Ambulatory Visit | Attending: Radiation Oncology | Admitting: Radiation Oncology

## 2013-10-09 DIAGNOSIS — C9000 Multiple myeloma not having achieved remission: Secondary | ICD-10-CM

## 2013-10-10 ENCOUNTER — Ambulatory Visit
Admission: RE | Admit: 2013-10-10 | Discharge: 2013-10-10 | Disposition: A | Discharge status: Home / Self Care | Payer: Medicare Other | Source: Ambulatory Visit | Attending: Radiation Oncology | Admitting: Radiation Oncology

## 2013-10-10 ENCOUNTER — Encounter (HOSPITAL_COMMUNITY): Payer: Self-pay

## 2013-10-11 ENCOUNTER — Ambulatory Visit
Admission: RE | Admit: 2013-10-11 | Discharge: 2013-10-11 | Disposition: A | Discharge status: Home / Self Care | Payer: Medicare Other | Source: Ambulatory Visit | Attending: Radiation Oncology | Admitting: Radiation Oncology

## 2013-10-12 ENCOUNTER — Telehealth: Payer: Self-pay | Admitting: *Deleted

## 2013-10-12 ENCOUNTER — Telehealth: Payer: Self-pay | Admitting: Hematology and Oncology

## 2013-10-12 ENCOUNTER — Encounter: Payer: Self-pay | Admitting: Hematology and Oncology

## 2013-10-12 ENCOUNTER — Other Ambulatory Visit: Payer: Self-pay | Admitting: *Deleted

## 2013-10-12 ENCOUNTER — Ambulatory Visit
Admission: RE | Admit: 2013-10-12 | Discharge: 2013-10-12 | Disposition: A | Discharge status: Home / Self Care | Payer: Medicare Other | Source: Ambulatory Visit | Attending: Radiation Oncology | Admitting: Radiation Oncology

## 2013-10-12 ENCOUNTER — Ambulatory Visit (HOSPITAL_BASED_OUTPATIENT_CLINIC_OR_DEPARTMENT_OTHER): Payer: Medicare Other | Admitting: Hematology and Oncology

## 2013-10-12 ENCOUNTER — Ambulatory Visit: Payer: Self-pay

## 2013-10-12 ENCOUNTER — Other Ambulatory Visit (HOSPITAL_BASED_OUTPATIENT_CLINIC_OR_DEPARTMENT_OTHER): Payer: Medicare Other

## 2013-10-12 VITALS — BP 111/67 | HR 96 | Temp 97.8°F | Resp 18 | Ht 64.0 in | Wt 132.2 lb

## 2013-10-12 DIAGNOSIS — D72829 Elevated white blood cell count, unspecified: Secondary | ICD-10-CM

## 2013-10-12 DIAGNOSIS — F17200 Nicotine dependence, unspecified, uncomplicated: Secondary | ICD-10-CM

## 2013-10-12 DIAGNOSIS — C9002 Multiple myeloma in relapse: Secondary | ICD-10-CM

## 2013-10-12 DIAGNOSIS — E559 Vitamin D deficiency, unspecified: Secondary | ICD-10-CM

## 2013-10-12 DIAGNOSIS — G893 Neoplasm related pain (acute) (chronic): Secondary | ICD-10-CM

## 2013-10-12 DIAGNOSIS — C9000 Multiple myeloma not having achieved remission: Secondary | ICD-10-CM

## 2013-10-12 LAB — CBC WITH DIFFERENTIAL/PLATELET
BASO%: 0.2 % (ref 0.0–2.0)
Basophils Absolute: 0 10*3/uL (ref 0.0–0.1)
EOS%: 0.6 % (ref 0.0–7.0)
HCT: 39.9 % (ref 34.8–46.6)
HGB: 13.4 g/dL (ref 11.6–15.9)
MCH: 31.3 pg (ref 25.1–34.0)
MONO%: 8 % (ref 0.0–14.0)
RDW: 13.5 % (ref 11.2–14.5)
WBC: 10.8 10*3/uL — ABNORMAL HIGH (ref 3.9–10.3)
lymph#: 3.8 10*3/uL — ABNORMAL HIGH (ref 0.9–3.3)

## 2013-10-12 NOTE — Telephone Encounter (Signed)
Per staff message and POF I have scheduled appts.  JMW  

## 2013-10-12 NOTE — Telephone Encounter (Signed)
gave pt appt for lab,md and chemo for [facility] 2015

## 2013-10-12 NOTE — Telephone Encounter (Signed)
Gave pt appt for lab and Md on January 2015,emailed Kendra Johns regarding chemo

## 2013-10-12 NOTE — Progress Notes (Signed)
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   01/03/2007 -  Radiation Therapy Approximate date only, received RT for plasmacytoma followed by surgery   09/14/2007 Initial Diagnosis MULTIPLE  MYELOMA   10/05/2007 Bone Marrow Biopsy Bm biopsy was negative   06/18/2008 Bone Marrow Biopsy BM biopsy was negative   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 [facility]   04/13/2011 Relapse/Recurrence Disease relapse   04/14/2011 Bone Marrow Biopsy Bm biopsy showed 2 % plasma cell   04/27/2011 Relapse/Recurrence Disease relapse, treated with Velcade/Cytoxan/Dex   09/07/2011 - 07/28/2013 Chemotherapy She has been receiving Velcade   12/27/2011 Bone Marrow Transplant 2nd transplant at [facility]   07/28/2013 Relapse/Recurrence Chemo is stopped due to progression of disease   08/29/2013 Imaging PEt/CT showed recurrence of disease with new lesion on her rib with compression fracture   09/13/2013 Bone Marrow Biopsy BM biopsy is hypercellular with 6% plasma cell   10/10/2013 -  Radiation Therapy Started on palliative XRT for rib pain    INTERVAL HISTORY: Kendra Johns 62 y.o. female returns for further followup and start of chemotherapy. Last week, she developed bronchitis and was prescribed antibiotics. She had severe rib pain and was referred to radiation oncologist. She just got started on radiation therapy this week. She  felt improvement with radiation therapy but she is taking more pain medicine because of recent rib fracture. She is attempting to quit cigarettes in down to 1 cigarettes a day. With the bronchitis, she is to have cough but nonproductive. Denies any further fevers or chills. She has very poor dentition and from previous visit I recommend dental evaluation. Due to recent infection and uncontrolled pain, she has not seen a dentist yet. I have reviewed the past medical history, past surgical history, social history and family history with the patient and they are unchanged from previous note.  ALLERGIES:  is allergic to codeine and ibuprofen.  MEDICATIONS:  Current Outpatient Prescriptions  Medication Sig Dispense Refill  . acyclovir (ZOVIRAX) 400 MG tablet Take 1 tablet (400 mg total) by mouth daily.  30 tablet  3  . albuterol (PROVENTIL HFA;VENTOLIN HFA) 108 (90 BASE) MCG/ACT inhaler Inhale 2 puffs into the lungs every 4 (four) hours as needed for wheezing or shortness of breath.  1 Inhaler  0  . azithromycin (ZITHROMAX) 250 MG tablet Take 250 mg by mouth daily. 500 mg on day one then 250 mg for 4 days. Started 11/29 for 5 day s      . benzonatate (TESSALON) 100 MG capsule Take 1 capsule (100 mg total) by mouth 3 (three) times daily as needed for cough.  20 capsule  0  . Cholecalciferol (VITAMIN D-3) 5000 UNITS TABS Take 5,000 Units by mouth daily.      Marland Kitchen dexamethasone (DECADRON) 4 MG tablet Take 8 mg by mouth once a week. tuesdays      . diazepam (VALIUM) 5 MG tablet Take 5 mg by  mouth every 12 (twelve) hours as needed for anxiety.      Marland Kitchen guaiFENesin (MUCINEX) 600 MG 12 hr tablet Take 600 mg by mouth 2 (two) times daily.      Marland Kitchen levofloxacin (LEVAQUIN) 500 MG tablet Take 1 tablet (500 mg total) by mouth daily.  7 tablet  0  . lisinopril (PRINIVIL,ZESTRIL) 20 MG tablet Take 20 mg by mouth every evening.      . Magnesium Hydroxide (MILK OF MAGNESIA PO) Take 30 mLs by mouth daily as needed  (constipation).       . morphine (MS CONTIN) 100 MG 12 hr tablet Take 100 mg by mouth every 12 (twelve) hours.      Marland Kitchen morphine (MSIR) 30 MG tablet Take 30 mg by mouth every 4 (four) hours as needed for severe pain.      Marland Kitchen ondansetron (ZOFRAN) 8 MG tablet Take 8 mg by mouth See admin instructions. Take two times a day starting the day after chemo for 2 days. Then take two times a day as needed for nausea or vomiting.      . prochlorperazine (COMPAZINE) 10 MG tablet Take 10 mg by mouth every 6 (six) hours as needed. nausea      . [DISCONTINUED] amLODipine (NORVASC) 5 MG tablet Take 1 tablet (5 mg total) by mouth daily.  90 tablet  3  . [DISCONTINUED] gabapentin (NEURONTIN) 600 MG tablet Take 600 mg by mouth 3 (three) times daily.         No current facility-administered medications for this visit.    REVIEW OF SYSTEMS:   Constitutional: Denies fevers, chills or abnormal weight loss Eyes: Denies blurriness of vision Ears, nose, mouth, throat, and face: Denies mucositis or sore throat Cardiovascular: Denies palpitation, chest discomfort or lower extremity swelling Gastrointestinal:  Denies nausea, heartburn or change in bowel habits Skin: Denies abnormal skin rashes Lymphatics: Denies new lymphadenopathy or easy bruising Neurological:Denies numbness, tingling or new weaknesses Behavioral/Psych: Mood is stable, no new changes  All other systems were reviewed with the patient and are negative.  PHYSICAL EXAMINATION: ECOG PERFORMANCE STATUS: 2 - Symptomatic, <50% confined to bed  Filed Vitals:   10/12/13 0855  BP: 111/67  Pulse: 96  Temp: 97.8 F (36.6 C)  Resp: 18   Filed Weights   10/12/13 0855  Weight: 132 lb 3.2 oz (59.966 kg)    GENERAL:alert, with mild distress and significant discomfort due to rib pain  SKIN: skin color, texture, turgor are normal, no rashes or significant lesions EYES: normal, Conjunctiva are pink and non-injected, sclera clear OROPHARYNX:no exudate, no  erythema and lips, buccal mucosa, and tongue normal  NECK: supple, thyroid normal size, non-tender, without nodularity LYMPH:  no palpable lymphadenopathy in the cervical, axillary or inguinal LUNGS: clear to auscultation and percussion with normal breathing effort HEART: regular rate & rhythm and no murmurs and no lower extremity edema ABDOMEN:abdomen soft, non-tender and normal bowel sounds Musculoskeletal:no cyanosis of digits and no clubbing  NEURO: alert & oriented x 3 with fluent speech, no focal motor/sensory deficits  LABORATORY DATA:  I have reviewed the data as listed    Component Value Date/Time   NA 138 10/06/2013 1920   NA 139 08/07/2013 1150   K 4.3 10/06/2013 1920   K 3.9 08/07/2013 1150   CL 103 10/06/2013 1920   CL 107 03/10/2013 1014   CO2 25 10/06/2013 1920   CO2 24 08/07/2013 1150   GLUCOSE 99 10/06/2013 1920   GLUCOSE 94 08/07/2013  1150   GLUCOSE 111* 03/10/2013 1014   GLUCOSE 98 08/31/2006 1024   BUN 10 10/06/2013 1920   BUN 13.3 08/07/2013 1150   CREATININE 0.63 10/06/2013 1920   CREATININE 0.8 08/07/2013 1150   CALCIUM 9.8 10/06/2013 1920   CALCIUM 9.8 08/07/2013 1150   PROT 7.2 10/06/2013 1920   PROT 7.5 08/07/2013 1150   ALBUMIN 3.7 10/06/2013 1920   ALBUMIN 3.7 08/07/2013 1150   AST 16 10/06/2013 1920   AST 47* 08/07/2013 1150   ALT 11 10/06/2013 1920   ALT 47 08/07/2013 1150   ALKPHOS 100 10/06/2013 1920   ALKPHOS 84 08/07/2013 1150   BILITOT 0.3 10/06/2013 1920   BILITOT 0.37 08/07/2013 1150   GFRNONAA >90 10/06/2013 1920   GFRAA >90 10/06/2013 1920    No results found for this basename: SPEP, UPEP,  kappa and lambda light chains    Lab Results  Component Value Date   WBC 10.8* 10/12/2013   NEUTROABS 6.0 10/12/2013   HGB 13.4 10/12/2013   HCT 39.9 10/12/2013   MCV 93.2 10/12/2013   PLT 195 10/12/2013      Chemistry      Component Value Date/Time   NA 138 10/06/2013 1920   NA 139 08/07/2013 1150   K 4.3 10/06/2013 1920   K 3.9 08/07/2013 1150   CL 103  10/06/2013 1920   CL 107 03/10/2013 1014   CO2 25 10/06/2013 1920   CO2 24 08/07/2013 1150   BUN 10 10/06/2013 1920   BUN 13.3 08/07/2013 1150   CREATININE 0.63 10/06/2013 1920   CREATININE 0.8 08/07/2013 1150      Component Value Date/Time   CALCIUM 9.8 10/06/2013 1920   CALCIUM 9.8 08/07/2013 1150   ALKPHOS 100 10/06/2013 1920   ALKPHOS 84 08/07/2013 1150   AST 16 10/06/2013 1920   AST 47* 08/07/2013 1150   ALT 11 10/06/2013 1920   ALT 47 08/07/2013 1150   BILITOT 0.3 10/06/2013 1920   BILITOT 0.37 08/07/2013 1150       RADIOGRAPHIC STUDIES: I have personally reviewed the radiological images as listed and agreed with the findings in the report. Chest x-ray from December 5 showed no infiltrate   ASSESSMENT & PLAN:  #1 multiple myeloma Due to ongoing radiation therapy, I will defer her chemotherapy to start until after the new year. Due to poor dentition, Zometa will not be given and until she gets dental clearance. I recommend she continue on pulse dose dexamethasone 8 mg once a week until I see her back. I will reschedule or her chemotherapy until after the new year #2 severe bone pain Her pain remains not under control. I recommend she contact her primary care provider for medication adjustment. In the meantime she will continue palliative radiation therapy. #3 poor dentition I recommend she see her dentist after her pain is better controlled and she completes radiation treatment #4 poor venous access She will need placement of Infuse-a-Port. I will place an order for him to support to be placed after Christmas #5 active smoking I congratulated her effort of attempting to quit smoking. I continue to encourage her. #6 recent bronchitis She is currently on antibiotics. Clinically she appears stable.  #7 leukocytosis She has very mild leukocytosis which could be due to side effects of steroid or infection  Orders Placed This Encounter  Procedures  . IR Fluoro Guide CV Line Left     Port placement for chemo, power port Please schedule port after Christmas  Standing Status: Future     Number of Occurrences:      Standing Expiration Date: 12/13/2014    Order Specific Question:  Reason for exam:    Answer:  need port placement    Order Specific Question:  Preferred Imaging Location?    Answer:  [facility]   All questions were answered. The patient knows to call the clinic with any problems, questions or concerns. No barriers to learning was detected.    Leighann Amadon, MD 10/12/2013 9:44 AM

## 2013-10-13 ENCOUNTER — Ambulatory Visit: Payer: Self-pay

## 2013-10-13 ENCOUNTER — Ambulatory Visit
Admission: RE | Admit: 2013-10-13 | Discharge: 2013-10-13 | Disposition: A | Discharge status: Home / Self Care | Payer: Medicare Other | Source: Ambulatory Visit | Attending: Radiation Oncology | Admitting: Radiation Oncology

## 2013-10-13 ENCOUNTER — Encounter: Payer: Self-pay | Admitting: Radiation Oncology

## 2013-10-13 ENCOUNTER — Ambulatory Visit: Payer: Self-pay | Admitting: Hematology and Oncology

## 2013-10-13 ENCOUNTER — Other Ambulatory Visit: Payer: Self-pay | Admitting: Lab

## 2013-10-13 VITALS — BP 130/84 | HR 91 | Temp 97.9°F | Resp 20 | Wt 132.3 lb

## 2013-10-13 DIAGNOSIS — C9000 Multiple myeloma not having achieved remission: Secondary | ICD-10-CM

## 2013-10-13 NOTE — Progress Notes (Signed)
Patient education done, skin irritation and fatigue,discussed p[ain meds and to take as precribed, she is under pain management and stated she needed to call and make appt to get something stonger med wise 12:27 PM

## 2013-10-13 NOTE — Progress Notes (Signed)
Department of Radiation Oncology  Phone:  [phone] Fax:        [phone]  Weekly Treatment Note    Name: Kendra Johns Date: 10/13/2013 MRN: [id number] DOB: August 29, 1951   Current dose: 12.5 Gy  Current fraction: 5   MEDICATIONS: Current Outpatient Prescriptions  Medication Sig Dispense Refill  . acyclovir (ZOVIRAX) 400 MG tablet Take 1 tablet (400 mg total) by mouth daily.  30 tablet  3  . albuterol (PROVENTIL HFA;VENTOLIN HFA) 108 (90 BASE) MCG/ACT inhaler Inhale 2 puffs into the lungs every 4 (four) hours as needed for wheezing or shortness of breath.  1 Inhaler  0  . azithromycin (ZITHROMAX) 250 MG tablet Take 250 mg by mouth daily. 500 mg on day one then 250 mg for 4 days. Started 11/29 for 5 day s      . benzonatate (TESSALON) 100 MG capsule Take 1 capsule (100 mg total) by mouth 3 (three) times daily as needed for cough.  20 capsule  0  . Cholecalciferol (VITAMIN D-3) 5000 UNITS TABS Take 5,000 Units by mouth daily.      Marland Kitchen dexamethasone (DECADRON) 4 MG tablet Take 8 mg by mouth once a week. tuesdays      . diazepam (VALIUM) 5 MG tablet Take 5 mg by mouth every 12 (twelve) hours as needed for anxiety.      Marland Kitchen guaiFENesin (MUCINEX) 600 MG 12 hr tablet Take 600 mg by mouth 2 (two) times daily.      Marland Kitchen levofloxacin (LEVAQUIN) 500 MG tablet Take 1 tablet (500 mg total) by mouth daily.  7 tablet  0  . lisinopril (PRINIVIL,ZESTRIL) 20 MG tablet Take 20 mg by mouth every evening.      . Magnesium Hydroxide (MILK OF MAGNESIA PO) Take 30 mLs by mouth daily as needed (constipation).       . morphine (MS CONTIN) 100 MG 12 hr tablet Take 100 mg by mouth every 12 (twelve) hours.      Marland Kitchen morphine (MSIR) 30 MG tablet Take 30 mg by mouth every 4 (four) hours as needed for severe pain.      Marland Kitchen ondansetron (ZOFRAN) 8 MG tablet Take 8 mg by mouth See admin instructions. Take two times a day starting the day after chemo for 2 days. Then take two times a day as needed for nausea or vomiting.       . prochlorperazine (COMPAZINE) 10 MG tablet Take 10 mg by mouth every 6 (six) hours as needed. nausea      . [DISCONTINUED] amLODipine (NORVASC) 5 MG tablet Take 1 tablet (5 mg total) by mouth daily.  90 tablet  3  . [DISCONTINUED] gabapentin (NEURONTIN) 600 MG tablet Take 600 mg by mouth 3 (three) times daily.         No current facility-administered medications for this encounter.     ALLERGIES: Codeine and Ibuprofen   LABORATORY DATA:  Lab Results  Component Value Date   WBC 10.8* 10/12/2013   HGB 13.4 10/12/2013   HCT 39.9 10/12/2013   MCV 93.2 10/12/2013   PLT 195 10/12/2013   Lab Results  Component Value Date   NA 138 10/06/2013   K 4.3 10/06/2013   CL 103 10/06/2013   CO2 25 10/06/2013   Lab Results  Component Value Date   ALT 11 10/06/2013   AST 16 10/06/2013   ALKPHOS 100 10/06/2013   BILITOT 0.3 10/06/2013     NARRATIVE: Kendra Johns was seen today for  weekly treatment management. The chart was checked and the patient's films were reviewed. The patient is not had any problems with pain. She has been coughing recently and continues to complain of significant chest wall/rib pain. She has her pain medicine managed by pain management.  PHYSICAL EXAMINATION: weight is 132 lb 4.8 oz (60.011 kg). Her oral temperature is 97.9 F (36.6 C). Her blood pressure is 130/84 and her pulse is 91. Her respiration is 20.        ASSESSMENT: The patient is doing satisfactorily with treatment.  PLAN: We will continue with the patient's radiation treatment as planned. The patient is going to call her pain management physician to discuss her current regimen.

## 2013-10-13 NOTE — Progress Notes (Signed)
Weekly rad txs, right rib and T-10-11 spine, 5/10 completed, c/o rt ribs chest pain a 9-10 ,took morphone 100mg  at 8am and 2 tabs 64m g at 10am, no relief, gets meds though pain management,  Eating okay, in w/c, very fatigued,weak, takes taking pain meds not as prescribed, will contact pain management  12:20 PM  12:19 PM

## 2013-10-14 ENCOUNTER — Encounter (HOSPITAL_COMMUNITY): Payer: Self-pay | Admitting: Emergency Medicine

## 2013-10-14 ENCOUNTER — Emergency Department (HOSPITAL_COMMUNITY): Payer: Medicare Other

## 2013-10-14 ENCOUNTER — Emergency Department (HOSPITAL_COMMUNITY)
Admission: EM | Admit: 2013-10-14 | Discharge: 2013-10-14 | Disposition: A | Discharge status: Home / Self Care | Payer: Medicare Other | Attending: Emergency Medicine | Admitting: Emergency Medicine

## 2013-10-14 DIAGNOSIS — IMO0002 Reserved for concepts with insufficient information to code with codable children: Secondary | ICD-10-CM | POA: Insufficient documentation

## 2013-10-14 DIAGNOSIS — Z79899 Other long term (current) drug therapy: Secondary | ICD-10-CM | POA: Insufficient documentation

## 2013-10-14 DIAGNOSIS — M8448XA Pathological fracture, other site, initial encounter for fracture: Secondary | ICD-10-CM | POA: Insufficient documentation

## 2013-10-14 DIAGNOSIS — I10 Essential (primary) hypertension: Secondary | ICD-10-CM | POA: Insufficient documentation

## 2013-10-14 DIAGNOSIS — F329 Major depressive disorder, single episode, unspecified: Secondary | ICD-10-CM | POA: Insufficient documentation

## 2013-10-14 DIAGNOSIS — Z8639 Personal history of other endocrine, nutritional and metabolic disease: Secondary | ICD-10-CM | POA: Insufficient documentation

## 2013-10-14 DIAGNOSIS — Z8719 Personal history of other diseases of the digestive system: Secondary | ICD-10-CM | POA: Insufficient documentation

## 2013-10-14 DIAGNOSIS — M545 Low back pain, unspecified: Secondary | ICD-10-CM | POA: Insufficient documentation

## 2013-10-14 DIAGNOSIS — S2231XA Fracture of one rib, right side, initial encounter for closed fracture: Secondary | ICD-10-CM

## 2013-10-14 DIAGNOSIS — M1990 Unspecified osteoarthritis, unspecified site: Secondary | ICD-10-CM | POA: Insufficient documentation

## 2013-10-14 DIAGNOSIS — Z8619 Personal history of other infectious and parasitic diseases: Secondary | ICD-10-CM | POA: Insufficient documentation

## 2013-10-14 DIAGNOSIS — F17200 Nicotine dependence, unspecified, uncomplicated: Secondary | ICD-10-CM | POA: Insufficient documentation

## 2013-10-14 DIAGNOSIS — G8929 Other chronic pain: Secondary | ICD-10-CM | POA: Insufficient documentation

## 2013-10-14 DIAGNOSIS — G4730 Sleep apnea, unspecified: Secondary | ICD-10-CM | POA: Insufficient documentation

## 2013-10-14 DIAGNOSIS — Z860100 Personal history of colon polyps, unspecified: Secondary | ICD-10-CM | POA: Insufficient documentation

## 2013-10-14 DIAGNOSIS — Z792 Long term (current) use of antibiotics: Secondary | ICD-10-CM | POA: Insufficient documentation

## 2013-10-14 DIAGNOSIS — C9000 Multiple myeloma not having achieved remission: Secondary | ICD-10-CM | POA: Insufficient documentation

## 2013-10-14 DIAGNOSIS — F411 Generalized anxiety disorder: Secondary | ICD-10-CM | POA: Insufficient documentation

## 2013-10-14 DIAGNOSIS — E785 Hyperlipidemia, unspecified: Secondary | ICD-10-CM | POA: Insufficient documentation

## 2013-10-14 DIAGNOSIS — Z9481 Bone marrow transplant status: Secondary | ICD-10-CM | POA: Insufficient documentation

## 2013-10-14 DIAGNOSIS — F3289 Other specified depressive episodes: Secondary | ICD-10-CM | POA: Insufficient documentation

## 2013-10-14 MED ORDER — FENTANYL CITRATE 0.05 MG/ML IJ SOLN: 50.0000 ug | Freq: Once | INTRAMUSCULAR | Status: DC

## 2013-10-14 MED ORDER — FENTANYL CITRATE 0.05 MG/ML IJ SOLN
100.0000 ug | Freq: Once | INTRAMUSCULAR | Status: AC
  Administered 2013-10-14: 100 ug via INTRAVENOUS
  Filled 2013-10-14: qty 2

## 2013-10-14 NOTE — ED Notes (Signed)
Pt arrives by EMS with hx cancer-mets to bone-uncontrolled pain at home.  Pt had first dose of radiation on Monday and has currently finished 5 treatment.  Pt was on Morphine, took 2 hours prior-unable to sleep over the last week/given Fentanyl 100 mcgs IV-#20 gauge left hand per EMS with moderate relief of pain

## 2013-10-14 NOTE — ED Provider Notes (Signed)
Medical screening examination/treatment/procedure(s) were performed by non-physician practitioner and as supervising physician I was immediately available for consultation/collaboration.  EKG Interpretation   None         Lyanne Co, MD 10/14/13 [phone]

## 2013-10-14 NOTE — ED Provider Notes (Signed)
Medical screening examination/treatment/procedure(s) were performed by non-physician practitioner and as supervising physician I was immediately available for consultation/collaboration.  EKG Interpretation   None         Jaxsun Ciampi M Analiz Tvedt, MD 10/14/13 0725 

## 2013-10-14 NOTE — ED Notes (Signed)
Bed: ZO10 Expected date:  Expected time:  Means of arrival: Ambulance Comments: EMS [place] pt

## 2013-10-14 NOTE — ED Notes (Signed)
Pt arrived to ED with a complaint of pain.  Pt is a cancer patient.  Pt states that the pain began three weeks ago but has progressed to a point of intolerability.  Pt states pain is on the right ribs extending to the abdomen.  Pt has had 100 mcg of fentanyl in route

## 2013-10-14 NOTE — ED Provider Notes (Addendum)
CSN: [id number]     Arrival date & time 10/14/13  0430 History   First MD Initiated Contact with Patient 10/14/13 0505     Chief Complaint  Patient presents with  . Pain   (Consider location/radiation/quality/duration/timing/severity/associated sxs/prior Treatment) HPI Comments: This.  As a 62 year old female with history of multiple myeloma with multiple lesions within her ribs and thoracic spine.  Comes in tonight with exacerbation of her chronic pain along her right rib cage.  She denies any fever, cough, nausea, vomiting, diarrhea, states she's been taking her medication as prescribed with little relief.  EMS administered 100 mics of fentanyl and she did receive significant short-term relief. Patient did receive radiation therapy Monday through Friday  The history is provided by the patient.    Past Medical History  Diagnosis Date  . Depression   . Hypertension   . Hepatitis C     genotype 1  . Diverticulosis of colon   . Multiple myeloma     kappa light chain, s/p high-dose chemo/stem cell tx - [facility]  . Hx of adenomatous colonic polyps 2007  . Anxiety   . Osteoarthritis   . Sleep apnea   . Hyperlipidemia   . GERD with stricture   . External hemorrhoids   . Hepatitis B virus infection   . IBS (irritable bowel syndrome)   . Helicobacter pylori gastritis 2001    ? if treated  . Sciatic nerve pain     with Dr. Delma Officer  . Chronic low back pain   . Unspecified vitamin D deficiency 09/22/2013  . Allergy    Past Surgical History  Procedure Laterality Date  . Abdominal hysterectomy    . Oophorectomy    . Dilation and curettage of uterus    . Bone marrow transplant      2009, 2013 [facility]  . Appendectomy    . Colonoscopy w/ polypectomy  08/2006    1-2 adenomas, 6 polyps total, diverticulosis and external hemorrhoids  . Upper gastrointestinal endoscopy  08/2003    esophageal stricture dilation, hiatal hernia, gastrritis  . Cholecystectomy    . Tonsillectomy and  adenoidectomy     Family History  Problem Relation Age of Onset  . Alcohol abuse    . Lung cancer Father   . Leukemia Mother   . Cirrhosis Sister   . Colon cancer Neg Hx    History  Substance Use Topics  . Smoking status: Current Every Day Smoker -- 0.25 packs/day for 40 years    Types: Cigarettes  . Smokeless tobacco: Never Used     Comment: 8 cigarettes per day or less  . Alcohol Use: No   OB History   Grav Para Term Preterm Abortions TAB SAB Ect Mult Living                 Review of Systems  Musculoskeletal: Positive for back pain.  All other systems reviewed and are negative.    Allergies  Codeine and Ibuprofen  Home Medications   Current Outpatient Rx  Name  Route  Sig  Dispense  Refill  . acyclovir (ZOVIRAX) 400 MG tablet   Oral   Take 1 tablet (400 mg total) by mouth daily.   30 tablet   3   . cholecalciferol (VITAMIN D) 1000 UNITS tablet   Oral   Take 5,000 Units by mouth every morning.         Marland Kitchen dexamethasone (DECADRON) 4 MG tablet   Oral   Take  8 mg by mouth once a week. On Tuesdays         . diazepam (VALIUM) 5 MG tablet   Oral   Take 5 mg by mouth every 12 (twelve) hours as needed for anxiety (or sleep).          Marland Kitchen lisinopril (PRINIVIL,ZESTRIL) 20 MG tablet   Oral   Take 20 mg by mouth every morning.          . magnesium hydroxide (MILK OF MAGNESIA) 400 MG/5ML suspension   Oral   Take 30 mLs by mouth daily as needed for mild constipation.         Marland Kitchen morphine (MS CONTIN) 100 MG 12 hr tablet   Oral   Take 100 mg by mouth every 12 (twelve) hours.         Marland Kitchen morphine (MSIR) 15 MG tablet   Oral   Take 15-30 mg by mouth every 4 (four) hours as needed for severe pain.         Marland Kitchen albuterol (PROVENTIL HFA;VENTOLIN HFA) 108 (90 BASE) MCG/ACT inhaler   Inhalation   Inhale 2 puffs into the lungs every 4 (four) hours as needed for wheezing or shortness of breath.   1 Inhaler   0   . azithromycin (ZITHROMAX) 250 MG tablet   Oral    Take 250-500 mg by mouth daily.         . benzonatate (TESSALON) 100 MG capsule   Oral   Take 1 capsule (100 mg total) by mouth 3 (three) times daily as needed for cough.   20 capsule   0   . ondansetron (ZOFRAN) 8 MG tablet   Oral   Take 8 mg by mouth See admin instructions. Take 1 tablet two times a day starting the day after chemo for 2 days. Then take two times a day as needed for nausea or vomiting.         . prochlorperazine (COMPAZINE) 10 MG tablet   Oral   Take 10 mg by mouth every 6 (six) hours as needed for nausea. nausea          BP 122/73  Pulse 91  Temp(Src) 99.3 F (37.4 C) (Oral)  Resp 18  SpO2 93% Physical Exam  Nursing note and vitals reviewed. Constitutional: She appears well-developed. She appears distressed.  HENT:  Head: Normocephalic.  Eyes: Pupils are equal, round, and reactive to light.  Neck: Normal range of motion.  Cardiovascular: Normal rate and regular rhythm.   Pulmonary/Chest: Effort normal and breath sounds normal.  Neurological: She is alert.  Skin: Skin is warm. No erythema.    ED Course  Procedures (including critical care time) Labs Reviewc Labs Reviewed - No data to display Imaging Review No results found.  EKG Interpretation   None       MDM  No diagnosis found.  Discussed pain control with patient she, states she's been on the same pain.  Regime for a little over a year.  She does have a pain specialist, when she spoke with her oncologist.  Modena Jansky.  He recommended that she be seen in the emergency room for acute pain control, and then contact her pain management doctor for any changes that are required    Arman Filter, NP 10/14/13 0551  Arman Filter, NP 10/25/13 [phone]

## 2013-10-14 NOTE — ED Provider Notes (Signed)
6:57 AM  Results for orders placed in visit on 10/12/13  CBC WITH DIFFERENTIAL      Result Value Range   WBC 10.8 (*) 3.9 - 10.3 10e3/uL   NEUT# 6.0  1.5 - 6.5 10e3/uL   HGB 13.4  11.6 - 15.9 g/dL   HCT 40.9  81.1 - 91.4 %   Platelets 195  145 - 400 10e3/uL   MCV 93.2  79.5 - 101.0 fL   MCH 31.3  25.1 - 34.0 pg   MCHC 33.6  31.5 - 36.0 g/dL   RBC 7.82  9.56 - 2.13 10e6/uL   RDW 13.5  11.2 - 14.5 %   lymph# 3.8 (*) 0.9 - 3.3 10e3/uL   MONO# 0.9  0.1 - 0.9 10e3/uL   Eosinophils Absolute 0.1  0.0 - 0.5 10e3/uL   Basophils Absolute 0.0  0.0 - 0.1 10e3/uL   NEUT% 55.9  38.4 - 76.8 %   LYMPH% 35.3  14.0 - 49.7 %   MONO% 8.0  0.0 - 14.0 %   EOS% 0.6  0.0 - 7.0 %   BASO% 0.2  0.0 - 2.0 %   Dg Chest 2 View  10/14/2013   CLINICAL DATA:  Worsening chronic right rib pain.  EXAM: CHEST  2 VIEW  COMPARISON:  Chest radiograph performed 10/06/2013  FINDINGS: The lungs are well-aerated. Mild left basilar opacity may reflect atelectasis. There is no evidence of pleural effusion or pneumothorax.  The heart is normal in size; the mediastinal contour is within normal limits. Known rib lesions are not well characterized on radiograph; there appear to be relatively acute mildly displaced fractures of the right lateral first, sixth and ninth ribs, present on the recent prior study but new from 2013.  IMPRESSION: 1. Mild left basilar airspace opacity may reflect atelectasis. 2. Apparent relatively acute mildly displaced fractures of the right lateral first, sixth and ninth ribs, present on the recent prior study but new from 2013. These may reflect the patient's known underlying rib lesions, not well characterized on radiograph.   Electronically Signed   By: Roanna Raider M.D.   On: 10/14/2013 06:28   Dg Chest 2 View  10/06/2013   CLINICAL DATA:  Chest pain and congestion. Wheezing. Multiple myeloma.  EXAM: CHEST  2 VIEW  COMPARISON:  09/19/2012 and PET-CT dated 08/29/2013.  FINDINGS: The heart remains normal  in size. No significant change in diffuse peribronchial thickening and accentuation of the interstitial markings. Minimal scoliosis. Diffuse osteopenia. If approximately 30% T6 superior endplate compression deformity with minimal progression.  IMPRESSION: 1. No acute abnormality. 2. Stable chronic bronchitic changes and chronic interstitial lung disease. 3. Diffuse osteopenia with minimally progressive compression deformity of the T6 vertebral body.   Electronically Signed   By: Gordan Payment M.D.   On: 10/06/2013 19:16    6:58 AM Patient signed out to me at shift change. Patient with right sided rib pain, has history of multiple myeloma with current radiation therapy. She states she has had upper respiratory type symptoms and has been coughing a lot. X-ray of the chest was obtained, and showed "relatively acute mildly displaced fractures of the right lateral first, sixth, ninth ribs."  Pt's pain is well managed in ED. She is feeling much better. She will follow up with primary care doctor.   Filed Vitals:   10/14/13 0433  BP: 122/73  Pulse: 91  Temp: 99.3 F (37.4 C)  TempSrc: Oral  Resp: 18  SpO2: 93%  Lottie Mussel, PA-C 10/14/13 [phone]

## 2013-10-15 ENCOUNTER — Encounter (HOSPITAL_COMMUNITY): Payer: Self-pay | Admitting: Emergency Medicine

## 2013-10-15 ENCOUNTER — Emergency Department (HOSPITAL_COMMUNITY)
Admission: EM | Admit: 2013-10-15 | Discharge: 2013-10-15 | Disposition: A | Discharge status: Home / Self Care | Payer: Medicare Other | Attending: Emergency Medicine | Admitting: Emergency Medicine

## 2013-10-15 DIAGNOSIS — I10 Essential (primary) hypertension: Secondary | ICD-10-CM | POA: Insufficient documentation

## 2013-10-15 DIAGNOSIS — Y939 Activity, unspecified: Secondary | ICD-10-CM | POA: Insufficient documentation

## 2013-10-15 DIAGNOSIS — C9000 Multiple myeloma not having achieved remission: Secondary | ICD-10-CM | POA: Insufficient documentation

## 2013-10-15 DIAGNOSIS — F3289 Other specified depressive episodes: Secondary | ICD-10-CM | POA: Insufficient documentation

## 2013-10-15 DIAGNOSIS — G8929 Other chronic pain: Secondary | ICD-10-CM | POA: Insufficient documentation

## 2013-10-15 DIAGNOSIS — F17200 Nicotine dependence, unspecified, uncomplicated: Secondary | ICD-10-CM | POA: Insufficient documentation

## 2013-10-15 DIAGNOSIS — B1920 Unspecified viral hepatitis C without hepatic coma: Secondary | ICD-10-CM | POA: Insufficient documentation

## 2013-10-15 DIAGNOSIS — E785 Hyperlipidemia, unspecified: Secondary | ICD-10-CM | POA: Insufficient documentation

## 2013-10-15 DIAGNOSIS — Z79899 Other long term (current) drug therapy: Secondary | ICD-10-CM | POA: Insufficient documentation

## 2013-10-15 DIAGNOSIS — Y929 Unspecified place or not applicable: Secondary | ICD-10-CM | POA: Insufficient documentation

## 2013-10-15 DIAGNOSIS — M545 Low back pain, unspecified: Secondary | ICD-10-CM | POA: Insufficient documentation

## 2013-10-15 DIAGNOSIS — S2231XS Fracture of one rib, right side, sequela: Secondary | ICD-10-CM

## 2013-10-15 DIAGNOSIS — M1990 Unspecified osteoarthritis, unspecified site: Secondary | ICD-10-CM | POA: Insufficient documentation

## 2013-10-15 DIAGNOSIS — F411 Generalized anxiety disorder: Secondary | ICD-10-CM | POA: Insufficient documentation

## 2013-10-15 DIAGNOSIS — X58XXXA Exposure to other specified factors, initial encounter: Secondary | ICD-10-CM | POA: Insufficient documentation

## 2013-10-15 DIAGNOSIS — IMO0002 Reserved for concepts with insufficient information to code with codable children: Secondary | ICD-10-CM | POA: Insufficient documentation

## 2013-10-15 DIAGNOSIS — K589 Irritable bowel syndrome without diarrhea: Secondary | ICD-10-CM | POA: Insufficient documentation

## 2013-10-15 DIAGNOSIS — R079 Chest pain, unspecified: Secondary | ICD-10-CM | POA: Insufficient documentation

## 2013-10-15 DIAGNOSIS — F329 Major depressive disorder, single episode, unspecified: Secondary | ICD-10-CM | POA: Insufficient documentation

## 2013-10-15 MED ORDER — HYDROMORPHONE HCL PF 1 MG/ML IJ SOLN
1.0000 mg | Freq: Once | INTRAMUSCULAR | Status: AC
  Administered 2013-10-15: 1 mg via INTRAVENOUS
  Filled 2013-10-15: qty 1

## 2013-10-15 NOTE — ED Provider Notes (Signed)
Medical screening examination/treatment/procedure(s) were performed by non-physician practitioner and as supervising physician I was immediately available for consultation/collaboration.  Jode Lippe L Bhavesh Vazquez, MD 10/15/13 2334 

## 2013-10-15 NOTE — ED Notes (Signed)
Pt has multiple myeloma and having rib pain that started on Thursday. Pt was seen here yesterday diagnosed with rib fracture. Pt hasnt been able to get in contact with her pain management doctor.

## 2013-10-15 NOTE — ED Provider Notes (Signed)
CSN: [id number]     Arrival date & time 10/15/13  1611 History   First MD Initiated Contact with Patient 10/15/13 1633     Chief Complaint  Patient presents with  . pain in ribs    (Consider location/radiation/quality/duration/timing/severity/associated sxs/prior Treatment) HPI Patient presents to the emergency department with right-sided rib pain.  Patient, states she was seen yesterday and diagnosed with rib fractures from her multiple myeloma.  Patient, states, that the pain medicine they gave seemed to wear off quickly after she got home.  Patient, states, that is no other symptoms.  Patient denies shortness of breath, nausea, vomiting, abdominal pain, back pain headache blurred vision, weakness, dizziness, or syncope.  Patient has not had any fevers.  Patient, states, that movement and palpation makes the pain, worse Past Medical History  Diagnosis Date  . Depression   . Hypertension   . Hepatitis C     genotype 1  . Diverticulosis of colon   . Multiple myeloma     kappa light chain, s/p high-dose chemo/stem cell tx - [facility]  . Hx of adenomatous colonic polyps 2007  . Anxiety   . Osteoarthritis   . Sleep apnea   . Hyperlipidemia   . GERD with stricture   . External hemorrhoids   . Hepatitis B virus infection   . IBS (irritable bowel syndrome)   . Helicobacter pylori gastritis 2001    ? if treated  . Sciatic nerve pain     with Dr. Delma Officer  . Chronic low back pain   . Unspecified vitamin D deficiency 09/22/2013  . Allergy    Past Surgical History  Procedure Laterality Date  . Abdominal hysterectomy    . Oophorectomy    . Dilation and curettage of uterus    . Bone marrow transplant      2009, 2013 [facility]  . Appendectomy    . Colonoscopy w/ polypectomy  08/2006    1-2 adenomas, 6 polyps total, diverticulosis and external hemorrhoids  . Upper gastrointestinal endoscopy  08/2003    esophageal stricture dilation, hiatal hernia, gastrritis  . Cholecystectomy    .  Tonsillectomy and adenoidectomy     Family History  Problem Relation Age of Onset  . Alcohol abuse    . Lung cancer Father   . Leukemia Mother   . Cirrhosis Sister   . Colon cancer Neg Hx    History  Substance Use Topics  . Smoking status: Current Every Day Smoker -- 0.25 packs/day for 40 years    Types: Cigarettes  . Smokeless tobacco: Never Used     Comment: 8 cigarettes per day or less  . Alcohol Use: No   OB History   Grav Para Term Preterm Abortions TAB SAB Ect Mult Living                 Review of Systems All other systems negative except as documented in the HPI. All pertinent positives and negatives as reviewed in the HPI. Allergies  Codeine and Ibuprofen  Home Medications   Current Outpatient Rx  Name  Route  Sig  Dispense  Refill  . acyclovir (ZOVIRAX) 400 MG tablet   Oral   Take 1 tablet (400 mg total) by mouth daily.   30 tablet   3   . albuterol (PROVENTIL HFA;VENTOLIN HFA) 108 (90 BASE) MCG/ACT inhaler   Inhalation   Inhale 2 puffs into the lungs every 4 (four) hours as needed for wheezing or shortness of breath.  1 Inhaler   0   . azithromycin (ZITHROMAX) 250 MG tablet   Oral   Take 250-500 mg by mouth daily.         . Cholecalciferol (VITAMIN D-3) 5000 UNITS TABS   Oral   Take 1 tablet by mouth every morning.         Marland Kitchen dexamethasone (DECADRON) 4 MG tablet   Oral   Take 8 mg by mouth once a week. On Tuesdays         . diazepam (VALIUM) 5 MG tablet   Oral   Take 5 mg by mouth every 12 (twelve) hours as needed for anxiety (or sleep).          Marland Kitchen lisinopril (PRINIVIL,ZESTRIL) 20 MG tablet   Oral   Take 20 mg by mouth every morning.          . magnesium hydroxide (MILK OF MAGNESIA) 400 MG/5ML suspension   Oral   Take 30 mLs by mouth daily as needed for mild constipation.         Marland Kitchen morphine (MS CONTIN) 100 MG 12 hr tablet   Oral   Take 100 mg by mouth every 12 (twelve) hours.         Marland Kitchen morphine (MSIR) 15 MG tablet    Oral   Take 30 mg by mouth every 4 (four) hours.          . prochlorperazine (COMPAZINE) 10 MG tablet   Oral   Take 10 mg by mouth every 6 (six) hours as needed for nausea. nausea          BP 131/79  Pulse 92  Temp(Src) 97.9 F (36.6 C) (Oral)  Resp 19  SpO2 95% Physical Exam  Nursing note and vitals reviewed. Constitutional: She is oriented to person, place, and time. She appears well-developed and well-nourished. No distress.  HENT:  Head: Normocephalic and atraumatic.  Mouth/Throat: Oropharynx is clear and moist.  Eyes: Pupils are equal, round, and reactive to light.  Cardiovascular: Normal rate, regular rhythm and normal heart sounds.  Exam reveals no gallop and no friction rub.   No murmur heard. Pulmonary/Chest: Effort normal and breath sounds normal. No respiratory distress. She exhibits tenderness.  Neurological: She is alert and oriented to person, place, and time.  Skin: Skin is warm and dry. No rash noted. No erythema.    ED Course  Procedures (including critical care time) Labs Review Labs Reviewed - No data to display Imaging Review Dg Chest 2 View  10/14/2013   CLINICAL DATA:  Worsening chronic right rib pain.  EXAM: CHEST  2 VIEW  COMPARISON:  Chest radiograph performed 10/06/2013  FINDINGS: The lungs are well-aerated. Mild left basilar opacity may reflect atelectasis. There is no evidence of pleural effusion or pneumothorax.  The heart is normal in size; the mediastinal contour is within normal limits. Known rib lesions are not well characterized on radiograph; there appear to be relatively acute mildly displaced fractures of the right lateral first, sixth and ninth ribs, present on the recent prior study but new from 2013.  IMPRESSION: 1. Mild left basilar airspace opacity may reflect atelectasis. 2. Apparent relatively acute mildly displaced fractures of the right lateral first, sixth and ninth ribs, present on the recent prior study but new from 2013. These  may reflect the patient's known underlying rib lesions, not well characterized on radiograph.   Electronically Signed   By: Roanna Raider M.D.   On: 10/14/2013 06:28   The  patient is feeling better following her for some pain medicine, but would like another shot of pain medication before she leaves patient, states, that she will speak with her pain management doctor tomorrow about further control of her pain.  Carlyle Dolly, PA-C 10/15/13 2028

## 2013-10-15 NOTE — ED Notes (Signed)
Bed: WA23 Expected date:  Expected time:  Means of arrival:  Comments: 

## 2013-10-16 ENCOUNTER — Ambulatory Visit
Admission: RE | Admit: 2013-10-16 | Discharge: 2013-10-16 | Disposition: A | Discharge status: Home / Self Care | Payer: Medicare Other | Source: Ambulatory Visit | Attending: Radiation Oncology | Admitting: Radiation Oncology

## 2013-10-16 ENCOUNTER — Telehealth: Payer: Self-pay | Admitting: *Deleted

## 2013-10-16 ENCOUNTER — Other Ambulatory Visit: Payer: Self-pay | Admitting: Hematology and Oncology

## 2013-10-16 DIAGNOSIS — C9000 Multiple myeloma not having achieved remission: Secondary | ICD-10-CM

## 2013-10-16 MED ORDER — MORPHINE SULFATE 30 MG PO TABS: 30.0000 mg | ORAL_TABLET | ORAL | Status: DC | PRN

## 2013-10-16 NOTE — Telephone Encounter (Signed)
Dr. Bertis Ruddy wrote Rx for Five tabs of MS IR 30 mg tablets,  One time until pt can see pain doctor.  Informed pt and she verbalized understanding and sending her granddaughter to pick up Rx.

## 2013-10-16 NOTE — Progress Notes (Signed)
Patient came to nursing before rad tx c/o right rib side pain, has call into her pain management,waiting for Dr.Phillips to call her back, she took at 4am  30mg  MSIR pain, and at 830am took 100mg  Morphine, then at 1100am took 15mg  MSIR, pain is getting some better,didn't know whether she should continue with radiation today,patient had gone to the ED on 10/14/13 Saturday for uncontrolled pain and again on Sunday 10/15/13 for pain, wasn't admitted either time, encouraged patient to have her radiation  She has only had 5 txs so far, her phone fell as she was standing up and bent over and retrieved herself without difficulty, patient skin warm and dry, vitals taken, T=98.4, b/p=119/8, rr=20, even,unlabored, granddaughter in with patient, she agreed to have radiation, escorted her and granddaughter back to machine #2, steady gait , will inform Dr.moody  In basket 11:23 AM

## 2013-10-16 NOTE — Telephone Encounter (Signed)
Pt states has been in severe pain right ribcage that she has had to take Three instead of Two of her MS IR 15 mg at a time and therefore has run out early.   She has appt hopefully to see Pain doctor tomorrow, but so far they will not give her Rx for more pain meds until she is seen in office.   Pt states pain has been so severe she went to ED twice in the past two days.  She is asking if Dr. Bertis Ruddy will prescribe pain meds today until she can see her pain doctor?  Pt states she will otherwise have to go back to ED for uncontrolled pain.

## 2013-10-17 ENCOUNTER — Ambulatory Visit
Admission: RE | Admit: 2013-10-17 | Discharge: 2013-10-17 | Disposition: A | Discharge status: Home / Self Care | Payer: Medicare Other | Source: Ambulatory Visit | Attending: Radiation Oncology | Admitting: Radiation Oncology

## 2013-10-17 ENCOUNTER — Telehealth: Payer: Self-pay | Admitting: Hematology and Oncology

## 2013-10-17 NOTE — Telephone Encounter (Signed)
talked to pt gave her appt for january 2015

## 2013-10-18 ENCOUNTER — Ambulatory Visit
Admission: RE | Admit: 2013-10-18 | Discharge: 2013-10-18 | Disposition: A | Discharge status: Home / Self Care | Payer: Medicare Other | Source: Ambulatory Visit | Attending: Radiation Oncology | Admitting: Radiation Oncology

## 2013-10-19 ENCOUNTER — Other Ambulatory Visit: Payer: Self-pay

## 2013-10-19 ENCOUNTER — Ambulatory Visit: Payer: Self-pay

## 2013-10-19 ENCOUNTER — Ambulatory Visit
Admission: RE | Admit: 2013-10-19 | Discharge: 2013-10-19 | Disposition: A | Discharge status: Home / Self Care | Payer: Medicare Other | Source: Ambulatory Visit | Attending: Radiation Oncology | Admitting: Radiation Oncology

## 2013-10-20 ENCOUNTER — Encounter: Payer: Self-pay | Admitting: Radiation Oncology

## 2013-10-20 ENCOUNTER — Ambulatory Visit
Admission: RE | Admit: 2013-10-20 | Discharge: 2013-10-20 | Disposition: A | Discharge status: Home / Self Care | Payer: Medicare Other | Source: Ambulatory Visit | Attending: Radiation Oncology | Admitting: Radiation Oncology

## 2013-10-20 ENCOUNTER — Ambulatory Visit: Payer: Self-pay

## 2013-10-20 VITALS — BP 148/91 | HR 86 | Temp 98.4°F | Resp 20 | Wt 131.7 lb

## 2013-10-20 DIAGNOSIS — C9000 Multiple myeloma not having achieved remission: Secondary | ICD-10-CM

## 2013-10-20 MED ORDER — SUCRALFATE 1 G PO TABS: 1.0000 g | ORAL_TABLET | Freq: Four times a day (QID) | ORAL | Status: DC

## 2013-10-20 NOTE — Progress Notes (Signed)
Weekly rad txs rt rib 7th, & T-10-11 spine, completed 10/10txs, pain a 7 right now right rib, has new pain patch lidocaine on 12h off 12 h, but she took off not wearing right now, siad 'pain much less" and pain medications upped by pain mgt MD ,fatigued, 1 month F/u appt 3:32 PM

## 2013-10-20 NOTE — Progress Notes (Signed)
Department of Radiation Oncology  Phone:  [phone] Fax:        [phone]  Weekly Treatment Note    Name: Kendra Johns Date: 10/20/2013 MRN: [id number] DOB: [birth year]   Current dose: 25 Gy  Current fraction: 10   MEDICATIONS: Current Outpatient Prescriptions  Medication Sig Dispense Refill  . acyclovir (ZOVIRAX) 400 MG tablet Take 1 tablet (400 mg total) by mouth daily.  30 tablet  3  . albuterol (PROVENTIL HFA;VENTOLIN HFA) 108 (90 BASE) MCG/ACT inhaler Inhale 2 puffs into the lungs every 4 (four) hours as needed for wheezing or shortness of breath.  1 Inhaler  0  . azithromycin (ZITHROMAX) 250 MG tablet Take 250-500 mg by mouth daily.      . Cholecalciferol (VITAMIN D-3) 5000 UNITS TABS Take 1 tablet by mouth every morning.      Marland Kitchen dexamethasone (DECADRON) 4 MG tablet Take 8 mg by mouth once a week. On Tuesdays      . diazepam (VALIUM) 5 MG tablet Take 5 mg by mouth every 12 (twelve) hours as needed for anxiety (or sleep).       Marland Kitchen lisinopril (PRINIVIL,ZESTRIL) 20 MG tablet Take 20 mg by mouth every morning.       . magnesium hydroxide (MILK OF MAGNESIA) 400 MG/5ML suspension Take 30 mLs by mouth daily as needed for mild constipation.      Marland Kitchen morphine (MS CONTIN) 100 MG 12 hr tablet Take 100 mg by mouth every 12 (twelve) hours.      Marland Kitchen morphine (MSIR) 15 MG tablet Take 30 mg by mouth every 4 (four) hours.       Marland Kitchen morphine (MSIR) 30 MG tablet Take 1 tablet (30 mg total) by mouth every 4 (four) hours as needed for severe pain.  5 tablet  0  . prochlorperazine (COMPAZINE) 10 MG tablet Take 10 mg by mouth every 6 (six) hours as needed for nausea. nausea      . sucralfate (CARAFATE) 1 G tablet Take 1 tablet (1 g total) by mouth 4 (four) times daily.  120 tablet  0  . [DISCONTINUED] amLODipine (NORVASC) 5 MG tablet Take 1 tablet (5 mg total) by mouth daily.  90 tablet  3  . [DISCONTINUED] gabapentin (NEURONTIN) 600 MG tablet Take 600 mg by mouth 3 (three) times daily.          No current facility-administered medications for this encounter.     ALLERGIES: Codeine and Ibuprofen   LABORATORY DATA:  Lab Results  Component Value Date   WBC 10.8* 10/12/2013   HGB 13.4 10/12/2013   HCT 39.9 10/12/2013   MCV 93.2 10/12/2013   PLT 195 10/12/2013   Lab Results  Component Value Date   NA 138 10/06/2013   K 4.3 10/06/2013   CL 103 10/06/2013   CO2 25 10/06/2013   Lab Results  Component Value Date   ALT 11 10/06/2013   AST 16 10/06/2013   ALKPHOS 100 10/06/2013   BILITOT 0.3 10/06/2013     NARRATIVE: Kendra Johns was seen today for weekly treatment management. The chart was checked and the patient's films were reviewed. The patient states that she is doing better in terms of pain. She still does have some but this is much improved. She has been using a lidocaine patch and does see pain management.  PHYSICAL EXAMINATION: weight is 131 lb 11.2 oz (59.739 kg). Her oral temperature is 98.4 F (36.9 C). Her blood pressure is 148/91 and  her pulse is 86. Her respiration is 20.        ASSESSMENT: The patient  did satisfactorily with treatment.  PLAN: Followup in one month.

## 2013-10-23 ENCOUNTER — Encounter (HOSPITAL_COMMUNITY): Payer: Self-pay | Admitting: Pharmacy Technician

## 2013-10-23 NOTE — Addendum Note (Signed)
Encounter addended by: Jonna Coup, MD on: 10/23/2013  9:40 PM<BR>     Documentation filed: Notes Section, Follow-up Section, LOS Section, Problem List, Visit Diagnoses

## 2013-10-23 NOTE — Progress Notes (Signed)
  Radiation Oncology         [phone] ________________________________  Name: Kendra Johns MRN: [id number]  Date: 10/04/2013  DOB: 1951-06-17  SIMULATION AND TREATMENT PLANNING NOTE  DIAGNOSIS:  Multiple myeloma  NARRATIVE:  The patient will receive palliative radiation treatment to 2 separate target areas : the T10/T11 vertebral body levels as well as a metastasis within the right rib and this treatment will also include the nearby thoracic spine which also has an area of involvement.  The patient was brought to the CT Simulation planning suite.  Identity was confirmed.  All relevant records and images related to the planned course of therapy were reviewed.   Written consent to proceed with treatment was confirmed which was freely given after reviewing the details related to the planned course of therapy had been reviewed with the patient.  Then, the patient was set-up in a stable reproducible  supine position for radiation therapy.  CT images were obtained.  Surface markings were placed.    The CT images were loaded into the planning software.  Then the target and avoidance structures were contoured.  Treatment planning then occurred.  The radiation prescription was entered and confirmed.  A total of 5 complex treatment devices were fabricated which relate to the designed radiation treatment fields. Each of these customized fields/ complex treatment devices will be used on a daily basis during the radiation course. I have requested : Isodose Plan.   PLAN:  The patient will receive 25 Gy in 10 fractions to both of the target areas.  ________________________________   Radene Gunning, MD, PhD

## 2013-10-23 NOTE — Progress Notes (Signed)
  Radiation Oncology         [phone] ________________________________  Name: Kendra Johns MRN: [id number]  Date: 10/20/2013  DOB: Jun 19, 1951  End of Treatment Note  Diagnosis:   Multiple myeloma     Indication for treatment:  Palliative       Radiation treatment dates:   10/09/2013 through 10/20/2013  Site/dose:    1.  T10/11 225 gray in 10 fractions at 2.5 gray per fraction. This was treated using AP and PA fields. 2.  Right 7th rib and nearby thoracic spine to 25 gray in 10 fractions at 2.5 gray per fraction. This was treated using a three-field technique.   Narrative: The patient tolerated radiation treatment relatively well.   The patient's pain did significantly improve during her course of treatment. She did describe some residual pain on her final day.   Plan: The patient has completed radiation treatment. The patient will return to radiation oncology clinic for routine followup in one month. I advised the patient to call or return sooner if they have any questions or concerns related to their recovery or treatment. ________________________________  Radene Gunning, M.D., Ph.D.

## 2013-10-23 NOTE — Progress Notes (Signed)
  Radiation Oncology         [phone] ________________________________  Name: Kendra Johns MRN: [id number]  Date: 10/09/2013  DOB: 05-06-1951  Simulation Verification Note   NARRATIVE: The patient was brought to the treatment unit and placed in the planned treatment position for both target site areas. The clinical setup was verified. Then port films were obtained and uploaded to the radiation oncology medical record software.  The treatment beams were carefully compared against the planned radiation fields. The position, location, and shape of the radiation fields was reviewed. The targeted volume of tissue appears to be appropriately covered by the radiation beams. Based on my personal review, I approved the simulation verification. The patient's treatment will proceed as planned.  ________________________________   Radene Gunning, MD, PhD

## 2013-10-24 ENCOUNTER — Other Ambulatory Visit: Payer: Self-pay | Admitting: Radiology

## 2013-10-27 ENCOUNTER — Other Ambulatory Visit: Payer: Self-pay

## 2013-10-27 ENCOUNTER — Ambulatory Visit: Payer: Self-pay

## 2013-10-28 NOTE — ED Provider Notes (Signed)
Medical screening examination/treatment/procedure(s) were performed by non-physician practitioner and as supervising physician I was immediately available for consultation/collaboration.  EKG Interpretation   None         Lanissa Cashen M Neko Mcgeehan, MD 10/28/13 0716 

## 2013-10-31 ENCOUNTER — Encounter (HOSPITAL_COMMUNITY): Payer: Self-pay

## 2013-10-31 ENCOUNTER — Ambulatory Visit (HOSPITAL_COMMUNITY)
Admission: RE | Admit: 2013-10-31 | Discharge: 2013-10-31 | Disposition: A | Discharge status: Home / Self Care | Payer: Medicare Other | Source: Ambulatory Visit | Attending: Hematology and Oncology | Admitting: Hematology and Oncology

## 2013-10-31 ENCOUNTER — Other Ambulatory Visit: Payer: Self-pay | Admitting: Hematology and Oncology

## 2013-10-31 DIAGNOSIS — E785 Hyperlipidemia, unspecified: Secondary | ICD-10-CM | POA: Insufficient documentation

## 2013-10-31 DIAGNOSIS — I10 Essential (primary) hypertension: Secondary | ICD-10-CM | POA: Insufficient documentation

## 2013-10-31 DIAGNOSIS — C9000 Multiple myeloma not having achieved remission: Secondary | ICD-10-CM

## 2013-10-31 DIAGNOSIS — C9002 Multiple myeloma in relapse: Secondary | ICD-10-CM | POA: Insufficient documentation

## 2013-10-31 DIAGNOSIS — F17200 Nicotine dependence, unspecified, uncomplicated: Secondary | ICD-10-CM | POA: Insufficient documentation

## 2013-10-31 DIAGNOSIS — G4730 Sleep apnea, unspecified: Secondary | ICD-10-CM | POA: Insufficient documentation

## 2013-10-31 DIAGNOSIS — K219 Gastro-esophageal reflux disease without esophagitis: Secondary | ICD-10-CM | POA: Insufficient documentation

## 2013-10-31 LAB — APTT: aPTT: 26 seconds (ref 24–37)

## 2013-10-31 LAB — PROTIME-INR
INR: 1.01 (ref 0.00–1.49)
Prothrombin Time: 13.1 seconds (ref 11.6–15.2)

## 2013-10-31 LAB — CBC
MCV: 92 fL (ref 78.0–100.0)
Platelets: 155 10*3/uL (ref 150–400)
RBC: 4.37 MIL/uL (ref 3.87–5.11)
WBC: 5.4 10*3/uL (ref 4.0–10.5)

## 2013-10-31 MED ORDER — MIDAZOLAM HCL 2 MG/2ML IJ SOLN
INTRAMUSCULAR | Status: AC | PRN
  Administered 2013-10-31 (×2): 0.5 mg via INTRAVENOUS
  Administered 2013-10-31: 1 mg via INTRAVENOUS
  Administered 2013-10-31 (×3): 0.5 mg via INTRAVENOUS

## 2013-10-31 MED ORDER — FENTANYL CITRATE 0.05 MG/ML IJ SOLN
INTRAMUSCULAR | Status: AC
  Filled 2013-10-31: qty 6

## 2013-10-31 MED ORDER — FENTANYL CITRATE 0.05 MG/ML IJ SOLN
INTRAMUSCULAR | Status: AC | PRN
  Administered 2013-10-31 (×2): 25 ug via INTRAVENOUS
  Administered 2013-10-31: 50 ug via INTRAVENOUS

## 2013-10-31 MED ORDER — HEPARIN SOD (PORK) LOCK FLUSH 100 UNIT/ML IV SOLN
INTRAVENOUS | Status: AC | PRN
  Administered 2013-10-31: 500 [IU]

## 2013-10-31 MED ORDER — SODIUM CHLORIDE 0.9 % IV SOLN
INTRAVENOUS | Status: DC
  Administered 2013-10-31: 08:00:00 via INTRAVENOUS

## 2013-10-31 MED ORDER — HEPARIN SOD (PORK) LOCK FLUSH 100 UNIT/ML IV SOLN
INTRAVENOUS | Status: AC
  Filled 2013-10-31: qty 5

## 2013-10-31 MED ORDER — MIDAZOLAM HCL 2 MG/2ML IJ SOLN
INTRAMUSCULAR | Status: AC
  Filled 2013-10-31: qty 6

## 2013-10-31 MED ORDER — LIDOCAINE-EPINEPHRINE (PF) 2 %-1:200000 IJ SOLN
INTRAMUSCULAR | Status: AC
  Filled 2013-10-31: qty 20

## 2013-10-31 MED ORDER — LIDOCAINE HCL 1 % IJ SOLN
INTRAMUSCULAR | Status: AC
  Filled 2013-10-31: qty 20

## 2013-10-31 MED ORDER — CEFAZOLIN SODIUM-DEXTROSE 2-3 GM-% IV SOLR
2.0000 g | INTRAVENOUS | Status: AC
  Administered 2013-10-31: 2 g via INTRAVENOUS
  Filled 2013-10-31: qty 50

## 2013-10-31 NOTE — Procedures (Signed)
Interventional Radiology Procedure Note  Procedure: Placement of a right IJ approach single lumen PowerPort.  Tip is positioned at the superior cavoatrial junction and catheter is ready for immediate use.  Complications: No immediate Recommendations:  - Ok to shower tomorrow - Do not submerge for 7 days - Routine line care   Signed,  Caro Brundidge K. Malyssa Maris, MD [facility]   

## 2013-10-31 NOTE — H&P (Signed)
Chief Complaint: "I am here for a port to be placed" Referring Physician: Dr. Bertis Ruddy HPI: Kendra Johns is an 62 y.o. female with multiple myeloma who is here today for a port-a-catheter placement. She admits that she has had one previously on the right side. She also states she has had recent bronchitis and has finished her antibiotics with still a residual clear sputum productive cough. She denies any fever or chills. She denies any chest pain or shortness of breath. She does admit to taking her home medication of morphine this morning. She has had sedation in the past with no complications. She denies any history of sleep apnea. She denies any active bleeding or taking any blood thinners.   Past Medical History:  Past Medical History  Diagnosis Date  . Depression   . Hypertension   . Hepatitis C     genotype 1  . Diverticulosis of colon   . Multiple myeloma     kappa light chain, s/p high-dose chemo/stem cell tx - [facility]  . Hx of adenomatous colonic polyps 2007  . Anxiety   . Osteoarthritis   . Sleep apnea   . Hyperlipidemia   . GERD with stricture   . External hemorrhoids   . Hepatitis B virus infection   . IBS (irritable bowel syndrome)   . Helicobacter pylori gastritis 2001    ? if treated  . Sciatic nerve pain     with Dr. Delma Officer  . Chronic low back pain   . Unspecified vitamin D deficiency 09/22/2013  . Allergy     Past Surgical History:  Past Surgical History  Procedure Laterality Date  . Abdominal hysterectomy    . Oophorectomy    . Dilation and curettage of uterus    . Bone marrow transplant      2009, 2013 [facility]  . Appendectomy    . Colonoscopy w/ polypectomy  08/2006    1-2 adenomas, 6 polyps total, diverticulosis and external hemorrhoids  . Upper gastrointestinal endoscopy  08/2003    esophageal stricture dilation, hiatal hernia, gastrritis  . Cholecystectomy    . Tonsillectomy and adenoidectomy      Family History:  Family History  Problem  Relation Age of Onset  . Alcohol abuse    . Lung cancer Father   . Leukemia Mother   . Cirrhosis Sister   . Colon cancer Neg Hx     Social History:  reports that she has been smoking Cigarettes.  She has a 10 pack-year smoking history. She has never used smokeless tobacco. She reports that she does not drink alcohol or use illicit drugs.  Allergies:  Allergies  Allergen Reactions  . Codeine Itching  . Ibuprofen Other (See Comments)    REACTION: gi trouble      Medication List    ASK your doctor about these medications       acyclovir 400 MG tablet  Commonly known as:  ZOVIRAX  Take 1 tablet (400 mg total) by mouth daily.     albuterol 108 (90 BASE) MCG/ACT inhaler  Commonly known as:  PROVENTIL HFA;VENTOLIN HFA  Inhale 2 puffs into the lungs every 4 (four) hours as needed for wheezing or shortness of breath.     dexamethasone 4 MG tablet  Commonly known as:  DECADRON  Take 8 mg by mouth once a week. On Tuesdays     diazepam 5 MG tablet  Commonly known as:  VALIUM  Take 5 mg by mouth every 12 (  twelve) hours as needed for anxiety (or sleep).     lisinopril 20 MG tablet  Commonly known as:  PRINIVIL,ZESTRIL  Take 20 mg by mouth every morning.     magnesium hydroxide 400 MG/5ML suspension  Commonly known as:  MILK OF MAGNESIA  Take 30 mLs by mouth daily as needed for mild constipation.     morphine 100 MG 12 hr tablet  Commonly known as:  MS CONTIN  Take 100 mg by mouth 3 (three) times daily.     morphine 15 MG tablet  Commonly known as:  MSIR  Take 30 mg by mouth every 4 (four) hours.     morphine 30 MG 12 hr tablet  Commonly known as:  MS CONTIN  Take 30 mg by mouth every 12 (twelve) hours.     prochlorperazine 10 MG tablet  Commonly known as:  COMPAZINE  Take 10 mg by mouth every 6 (six) hours as needed for nausea.     sucralfate 1 G tablet  Commonly known as:  CARAFATE  Take 1 g by mouth 4 (four) times daily.     Vitamin D-3 5000 UNITS Tabs  Take 1  tablet by mouth every morning.       Please HPI for pertinent positives, otherwise complete 10 system ROS negative.  Physical Exam: BP 124/86  Pulse 85  Temp(Src) 97.5 F (36.4 C) (Oral)  Resp 20  SpO2 95% There is no weight on file to calculate BMI.  General Appearance:  Alert, cooperative, no distress  Head:  Normocephalic, without obvious abnormality, atraumatic  Neck: Supple, symmetrical, trachea midline  Lungs:   Clear to auscultation bilaterally, no w/r/r, respirations unlabored without use of accessory muscles.  Chest Wall:  No tenderness or deformity  Heart:  Regular rate and rhythm, S1, S2 normal, no murmur, rub or gallop.  Abdomen:   Soft, non-tender, non distended, (+) BS  Extremities: Extremities normal, atraumatic, no cyanosis or edema  Pulses: 2+ and symmetric  Neurologic: Normal affect, no gross deficits.   Results for orders placed during the hospital encounter of 10/31/13 (from the past 48 hour(s))  APTT     Status: None   Collection Time    10/31/13  8:10 AM      Result Value Range   aPTT 26  24 - 37 seconds  CBC     Status: None   Collection Time    10/31/13  8:10 AM      Result Value Range   WBC 5.4  4.0 - 10.5 K/uL   RBC 4.37  3.87 - 5.11 MIL/uL   Hemoglobin 13.6  12.0 - 15.0 g/dL   HCT 16.1  09.6 - 04.5 %   MCV 92.0  78.0 - 100.0 fL   MCH 31.1  26.0 - 34.0 pg   MCHC 33.8  30.0 - 36.0 g/dL   RDW 40.9  81.1 - 91.4 %   Platelets 155  150 - 400 K/uL  PROTIME-INR     Status: None   Collection Time    10/31/13  8:10 AM      Result Value Range   Prothrombin Time 13.1  11.6 - 15.2 seconds   INR 1.01  0.00 - 1.49   No results found.  Assessment/Plan Multiple Myeloma. Request for an image guided port-a-catheter placement. Patient has been NPO, labs reviewed, ancef ordered. Risks and Benefits discussed with the patient. All of the patient's questions were answered, patient is agreeable to proceed. Consent signed and in  chart.   Pattricia Boss D  PA-C 10/31/2013, 8:49 AM

## 2013-11-06 ENCOUNTER — Encounter: Payer: Self-pay | Admitting: Hematology and Oncology

## 2013-11-06 ENCOUNTER — Ambulatory Visit (HOSPITAL_BASED_OUTPATIENT_CLINIC_OR_DEPARTMENT_OTHER): Payer: Medicare Other | Admitting: Hematology and Oncology

## 2013-11-06 ENCOUNTER — Ambulatory Visit: Payer: Self-pay

## 2013-11-06 ENCOUNTER — Telehealth: Payer: Self-pay | Admitting: Hematology and Oncology

## 2013-11-06 ENCOUNTER — Other Ambulatory Visit (HOSPITAL_BASED_OUTPATIENT_CLINIC_OR_DEPARTMENT_OTHER): Payer: Medicare Other

## 2013-11-06 VITALS — BP 133/80 | HR 92 | Temp 97.6°F | Resp 18 | Ht 64.0 in | Wt 127.3 lb

## 2013-11-06 DIAGNOSIS — C9000 Multiple myeloma not having achieved remission: Secondary | ICD-10-CM

## 2013-11-06 DIAGNOSIS — E559 Vitamin D deficiency, unspecified: Secondary | ICD-10-CM

## 2013-11-06 LAB — CBC WITH DIFFERENTIAL/PLATELET
BASO%: 0.4 % (ref 0.0–2.0)
Basophils Absolute: 0 10*3/uL (ref 0.0–0.1)
EOS%: 2.2 % (ref 0.0–7.0)
Eosinophils Absolute: 0.1 10*3/uL (ref 0.0–0.5)
HCT: 40.7 % (ref 34.8–46.6)
HGB: 13.6 g/dL (ref 11.6–15.9)
LYMPH%: 27.6 % (ref 14.0–49.7)
MCH: 31.1 pg (ref 25.1–34.0)
MCHC: 33.4 g/dL (ref 31.5–36.0)
MCV: 92.9 fL (ref 79.5–101.0)
MONO#: 0.4 10*3/uL (ref 0.1–0.9)
MONO%: 7.8 % (ref 0.0–14.0)
NEUT#: 3 10*3/uL (ref 1.5–6.5)
NEUT%: 62 % (ref 38.4–76.8)
NRBC: 0 % (ref 0–0)
PLATELETS: 140 10*3/uL — AB (ref 145–400)
RBC: 4.38 10*6/uL (ref 3.70–5.45)
RDW: 13.7 % (ref 11.2–14.5)
WBC: 4.9 10*3/uL (ref 3.9–10.3)
lymph#: 1.4 10*3/uL (ref 0.9–3.3)

## 2013-11-06 MED ORDER — LIDOCAINE-PRILOCAINE 2.5-2.5 % EX CREA: 1.0000 "application " | TOPICAL_CREAM | CUTANEOUS | Status: DC | PRN

## 2013-11-06 MED ORDER — DEXAMETHASONE SODIUM PHOSPHATE 10 MG/ML IJ SOLN
INTRAMUSCULAR | Status: AC
  Filled 2013-11-06: qty 1

## 2013-11-06 MED ORDER — ONDANSETRON 8 MG/NS 50 ML IVPB
INTRAVENOUS | Status: AC
  Filled 2013-11-06: qty 8

## 2013-11-06 NOTE — Progress Notes (Signed)
[facility] OFFICE PROGRESS NOTE  Patient Care Team: Lisabeth Pick, MD as PCP - General Jeanann Lewandowsky as Consulting Physician (Medical Oncology)  DIAGNOSIS: Multiple myeloma light chain disease  SUMMARY OF ONCOLOGIC HISTORY: Oncology History   Multiple myeloma, kappa light chain disease, Durie-Salmon stage III     MULTIPLE  MYELOMA   07/16/2006 Bone Marrow Biopsy BM biopsy is non-diagnostic   09/21/2006 Procedure L5 vertebral biopsy 5% plasma cell   10/25/2006 Bone Marrow Biopsy BM biopsy is hypercellular with 5% plasma cell   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   01/03/2007 -  Radiation Therapy Approximate date only, received RT for plasmacytoma followed by surgery   09/14/2007 Initial Diagnosis MULTIPLE  MYELOMA   10/05/2007 Bone Marrow Biopsy Bm biopsy was negative   06/18/2008 Bone Marrow Biopsy BM biopsy was negative   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 [facility]   04/13/2011 Relapse/Recurrence Disease relapse   04/14/2011 Bone Marrow Biopsy Bm biopsy showed 2 % plasma cell   04/27/2011 Relapse/Recurrence Disease relapse, treated with Velcade/Cytoxan/Dex   09/07/2011 - 07/28/2013 Chemotherapy She has been receiving Velcade   12/27/2011 Bone Marrow Transplant 2nd transplant at [facility]   07/28/2013 Relapse/Recurrence Chemo is stopped due to progression of disease   08/29/2013 Imaging PEt/CT showed recurrence of disease with new lesion on her rib with compression fracture   09/13/2013 Bone Marrow Biopsy BM biopsy is hypercellular with 6% plasma cell   10/10/2013 -  Radiation Therapy Started on palliative XRT for rib pain    INTERVAL HISTORY: Kendra Johns 63 y.o. female returns for further followup. She just completed radiation therapy recently. Her pain around her ribs is much better control. Her appetite is poor and she has lost some weight since I saw her. She is also complaining of persistent upper surgery tract congestion. Denies  any fevers or chills. Denies any constipation. She is currently attempted to quit smoking, down to 1-2 cigarettes per day. She has rare poor dentition and has not visited with her dentist  I have reviewed the past medical history, past surgical history, social history and family history with the patient and they are unchanged from previous note.  ALLERGIES:  is allergic to codeine and ibuprofen.  MEDICATIONS:  Current Outpatient Prescriptions  Medication Sig Dispense Refill  . acyclovir (ZOVIRAX) 400 MG tablet Take 1 tablet (400 mg total) by mouth daily.  30 tablet  3  . albuterol (PROVENTIL HFA;VENTOLIN HFA) 108 (90 BASE) MCG/ACT inhaler Inhale 2 puffs into the lungs every 4 (four) hours as needed for wheezing or shortness of breath.  1 Inhaler  0  . Cholecalciferol (VITAMIN D-3) 5000 UNITS TABS Take 1 tablet by mouth every morning.      Marland Kitchen dexamethasone (DECADRON) 4 MG tablet Take 8 mg by mouth once a week. On Tuesdays      . diazepam (VALIUM) 5 MG tablet Take 5 mg by mouth every 12 (twelve) hours as needed for anxiety (or sleep).       Marland Kitchen lisinopril (PRINIVIL,ZESTRIL) 20 MG tablet Take 20 mg by mouth every morning.       . magnesium hydroxide (MILK OF MAGNESIA) 400 MG/5ML suspension Take 30 mLs by mouth daily as needed for mild constipation.      Marland Kitchen morphine (MS CONTIN) 100 MG 12 hr tablet Take 100 mg by mouth 3 (three) times daily.       Marland Kitchen morphine (MS CONTIN) 30 MG 12 hr tablet Take 30 mg  by mouth every 12 (twelve) hours.      . prochlorperazine (COMPAZINE) 10 MG tablet Take 10 mg by mouth every 6 (six) hours as needed for nausea.       . sucralfate (CARAFATE) 1 G tablet Take 1 g by mouth 4 (four) times daily.      Marland Kitchen lidocaine-prilocaine (EMLA) cream Apply 1 application topically as needed.  30 g  0  . [DISCONTINUED] amLODipine (NORVASC) 5 MG tablet Take 1 tablet (5 mg total) by mouth daily.  90 tablet  3  . [DISCONTINUED] gabapentin (NEURONTIN) 600 MG tablet Take 600 mg by mouth 3 (three)  times daily.         No current facility-administered medications for this visit.    REVIEW OF SYSTEMS:   Eyes: Denies blurriness of vision Ears, nose, mouth, throat, and face: Denies mucositis or sore throat Cardiovascular: Denies palpitation, chest discomfort or lower extremity swelling Gastrointestinal:  Denies nausea, heartburn or change in bowel habits Skin: Denies abnormal skin rashes Lymphatics: Denies new lymphadenopathy or easy bruising Neurological:Denies numbness, tingling or new weaknesses Behavioral/Psych: Mood is stable, no new changes  All other systems were reviewed with the patient and are negative.  PHYSICAL EXAMINATION: ECOG PERFORMANCE STATUS: 2 - Symptomatic, <50% confined to bed  Filed Vitals:   11/06/13 0928  BP: 133/80  Pulse: 92  Temp: 97.6 F (36.4 C)  Resp: 18   Filed Weights   11/06/13 0928  Weight: 127 lb 4.8 oz (57.743 kg)    GENERAL:alert, no distress and comfortable. She looks thin but not cachectic SKIN: skin color, texture, turgor are normal, no rashes or significant lesions EYES: normal, Conjunctiva are pink and non-injected, sclera clear OROPHARYNX:no exudate, no erythema and lips, buccal mucosa, and tongue normal  NECK: supple, thyroid normal size, non-tender, without nodularity LYMPH:  no palpable lymphadenopathy in the cervical, axillary or inguinal LUNGS: clear to auscultation and percussion with normal breathing effort HEART: regular rate & rhythm and no murmurs and no lower extremity edema ABDOMEN:abdomen soft, non-tender and normal bowel sounds Musculoskeletal:no cyanosis of digits and no clubbing  NEURO: alert & oriented x 3 with fluent speech, no focal motor/sensory deficits  LABORATORY DATA:  I have reviewed the data as listed    Component Value Date/Time   NA 138 10/06/2013 1920   NA 139 08/07/2013 1150   K 4.3 10/06/2013 1920   K 3.9 08/07/2013 1150   CL 103 10/06/2013 1920   CL 107 03/10/2013 1014   CO2 25 10/06/2013 1920    CO2 24 08/07/2013 1150   GLUCOSE 99 10/06/2013 1920   GLUCOSE 94 08/07/2013 1150   GLUCOSE 111* 03/10/2013 1014   GLUCOSE 98 08/31/2006 1024   BUN 10 10/06/2013 1920   BUN 13.3 08/07/2013 1150   CREATININE 0.63 10/06/2013 1920   CREATININE 0.8 08/07/2013 1150   CALCIUM 9.8 10/06/2013 1920   CALCIUM 9.8 08/07/2013 1150   PROT 7.2 10/06/2013 1920   PROT 7.5 08/07/2013 1150   ALBUMIN 3.7 10/06/2013 1920   ALBUMIN 3.7 08/07/2013 1150   AST 16 10/06/2013 1920   AST 47* 08/07/2013 1150   ALT 11 10/06/2013 1920   ALT 47 08/07/2013 1150   ALKPHOS 100 10/06/2013 1920   ALKPHOS 84 08/07/2013 1150   BILITOT 0.3 10/06/2013 1920   BILITOT 0.37 08/07/2013 1150   GFRNONAA >90 10/06/2013 1920   GFRAA >90 10/06/2013 1920    No results found for this basename: SPEP, UPEP,  kappa and lambda light  chains    Lab Results  Component Value Date   WBC 4.9 11/06/2013   NEUTROABS 3.0 11/06/2013   HGB 13.6 11/06/2013   HCT 40.7 11/06/2013   MCV 92.9 11/06/2013   PLT 140* 11/06/2013      Chemistry      Component Value Date/Time   NA 138 10/06/2013 1920   NA 139 08/07/2013 1150   K 4.3 10/06/2013 1920   K 3.9 08/07/2013 1150   CL 103 10/06/2013 1920   CL 107 03/10/2013 1014   CO2 25 10/06/2013 1920   CO2 24 08/07/2013 1150   BUN 10 10/06/2013 1920   BUN 13.3 08/07/2013 1150   CREATININE 0.63 10/06/2013 1920   CREATININE 0.8 08/07/2013 1150      Component Value Date/Time   CALCIUM 9.8 10/06/2013 1920   CALCIUM 9.8 08/07/2013 1150   ALKPHOS 100 10/06/2013 1920   ALKPHOS 84 08/07/2013 1150   AST 16 10/06/2013 1920   AST 47* 08/07/2013 1150   ALT 11 10/06/2013 1920   ALT 47 08/07/2013 1150   BILITOT 0.3 10/06/2013 1920   BILITOT 0.37 08/07/2013 1150     ASSESSMENT & PLAN:  #1 multiple myeloma Clinically, since she received radiation therapy, she looked better. However she is still recovering from her recent rib fracture and her performance status is slowly improving Due to poor dentition, Zometa will not be given and until she gets  dental clearance. I recommend she continue on pulse dose dexamethasone 8 mg once a week until I see her back. I will reschedule or her chemotherapy to stop in another 3 weeks to allow more time for her to heal. She agreed  #2 severe bone pain Her pain is under better control. She will continue followup with her primary doctor for medication adjustments  #3 poor dentition I recommend she see her dentist after her pain is better controlled and she completes radiation treatment #4 poor venous access The patient had placement of Infuse-a-Port. I will go ahead and prescribe EMLA cream for her to use #5 active smoking I congratulated her effort of attempting to quit smoking. I continue to encourage her. #6 recent bronchitis She is currently on antibiotics. Clinically she appears stable. I think this is viral in nature and likely due to mild COPD exacerbation. I do not feel she needs to take antibiotics as she has no evidence of leukocytosis  Orders Placed This Encounter  Procedures  . SPEP & IFE with QIG    Standing Status: Future     Number of Occurrences:      Standing Expiration Date: 11/06/2014  . Kappa/lambda light chains    Standing Status: Future     Number of Occurrences:      Standing Expiration Date: 11/06/2014  . Beta 2 microglobuline, serum    Standing Status: Future     Number of Occurrences:      Standing Expiration Date: 11/06/2014   All questions were answered. The patient knows to call the clinic with any problems, questions or concerns. No barriers to learning was detected. I spent 25 minutes counseling the patient face to face. The total time spent in the appointment was 40 minutes and more than 50% was on counseling and review of test results     [facility], [place], MD 11/06/2013 11:19 AM

## 2013-11-06 NOTE — Telephone Encounter (Signed)
gv and printed appt sched and avs for pt for Jan and Feb 2015....sed added tx.   °

## 2013-11-07 ENCOUNTER — Ambulatory Visit: Payer: Self-pay

## 2013-11-08 ENCOUNTER — Encounter: Payer: Self-pay | Admitting: Family Medicine

## 2013-11-08 ENCOUNTER — Ambulatory Visit (INDEPENDENT_AMBULATORY_CARE_PROVIDER_SITE_OTHER): Payer: Medicare Other | Admitting: Family Medicine

## 2013-11-08 VITALS — BP 138/88 | HR 98 | Temp 99.1°F | Wt 127.0 lb

## 2013-11-08 DIAGNOSIS — J988 Other specified respiratory disorders: Secondary | ICD-10-CM

## 2013-11-08 MED ORDER — AZITHROMYCIN 250 MG PO TABS: ORAL_TABLET | ORAL | Status: DC

## 2013-11-08 MED ORDER — HYDROCOD POLST-CHLORPHEN POLST 10-8 MG/5ML PO LQCR: 5.0000 mL | Freq: Two times a day (BID) | ORAL | Status: DC | PRN

## 2013-11-08 NOTE — Progress Notes (Signed)
Pre visit review using our clinic review tool, if applicable. No additional management support is needed unless otherwise documented below in the visit note. 

## 2013-11-08 NOTE — Patient Instructions (Signed)
-  As we discussed, we have prescribed a new medication for you at this appointment. We discussed the common and serious potential adverse effects of this medication and you can review these and more with the pharmacist when you pick up your medication.  Please follow the instructions for use carefully and notify us immediately if you have any problems taking this medication.  -follow up with your doctor if worsening, persists or other concerns

## 2013-11-08 NOTE — Progress Notes (Signed)
Chief Complaint  Patient presents with  . Cough    congestion, runny nose     HPI:  -started: 2 weeks ago -symptoms:nasal congestion, cough - productive -denies:fever, SOB, NVD, tooth pain -has tried: nothing -sick contacts/travel/risks: denies flu exposure or Ebola risks -Hx of: reports hx of bronchitis tx with tussionex, inh and abx and feels like that again  ROS: See pertinent positives and negatives per HPI.  Past Medical History  Diagnosis Date  . Depression   . Hypertension   . Hepatitis C     genotype 1  . Diverticulosis of colon   . Multiple myeloma     kappa light chain, s/p high-dose chemo/stem cell tx - [facility]  . Hx of adenomatous colonic polyps 2007  . Anxiety   . Osteoarthritis   . Sleep apnea   . Hyperlipidemia   . GERD with stricture   . External hemorrhoids   . Hepatitis B virus infection   . IBS (irritable bowel syndrome)   . Helicobacter pylori gastritis 2001    ? if treated  . Sciatic nerve pain     with Dr. Earle Gell  . Chronic low back pain   . Unspecified vitamin D deficiency 09/22/2013  . Allergy     Past Surgical History  Procedure Laterality Date  . Abdominal hysterectomy    . Oophorectomy    . Dilation and curettage of uterus    . Bone marrow transplant      2009, 2013 [facility]  . Appendectomy    . Colonoscopy w/ polypectomy  08/2006    1-2 adenomas, 6 polyps total, diverticulosis and external hemorrhoids  . Upper gastrointestinal endoscopy  08/2003    esophageal stricture dilation, hiatal hernia, gastrritis  . Cholecystectomy    . Tonsillectomy and adenoidectomy      Family History  Problem Relation Age of Onset  . Alcohol abuse    . Lung cancer Father   . Leukemia Mother   . Cirrhosis Sister   . Colon cancer Neg Hx     History   Social History  . Marital Status: Married    Spouse Name: N/A    Number of Children: N/A  . Years of Education: N/A   Social History Main Topics  . Smoking status: Current Every Day Smoker  -- 0.25 packs/day for 40 years    Types: Cigarettes  . Smokeless tobacco: Never Used     Comment: 8 cigarettes per day or less  . Alcohol Use: No  . Drug Use: No  . Sexual Activity: No   Other Topics Concern  . None   Social History Narrative  . None    Current outpatient prescriptions:acyclovir (ZOVIRAX) 400 MG tablet, Take 1 tablet (400 mg total) by mouth daily., Disp: 30 tablet, Rfl: 3;  albuterol (PROVENTIL HFA;VENTOLIN HFA) 108 (90 BASE) MCG/ACT inhaler, Inhale 2 puffs into the lungs every 4 (four) hours as needed for wheezing or shortness of breath., Disp: 1 Inhaler, Rfl: 0;  Cholecalciferol (VITAMIN D-3) 5000 UNITS TABS, Take 1 tablet by mouth every morning., Disp: , Rfl:  dexamethasone (DECADRON) 4 MG tablet, Take 8 mg by mouth once a week. On Tuesdays, Disp: , Rfl: ;  diazepam (VALIUM) 5 MG tablet, Take 5 mg by mouth every 12 (twelve) hours as needed for anxiety (or sleep). , Disp: , Rfl: ;  lidocaine-prilocaine (EMLA) cream, Apply 1 application topically as needed., Disp: 30 g, Rfl: 0;  lisinopril (PRINIVIL,ZESTRIL) 20 MG tablet, Take 20 mg by  mouth every morning. , Disp: , Rfl:  magnesium hydroxide (MILK OF MAGNESIA) 400 MG/5ML suspension, Take 30 mLs by mouth daily as needed for mild constipation., Disp: , Rfl: ;  morphine (MS CONTIN) 100 MG 12 hr tablet, Take 100 mg by mouth 3 (three) times daily. , Disp: , Rfl: ;  morphine (MS CONTIN) 30 MG 12 hr tablet, Take 30 mg by mouth every 12 (twelve) hours., Disp: , Rfl:  prochlorperazine (COMPAZINE) 10 MG tablet, Take 10 mg by mouth every 6 (six) hours as needed for nausea. , Disp: , Rfl: ;  sucralfate (CARAFATE) 1 G tablet, Take 1 g by mouth 4 (four) times daily., Disp: , Rfl: ;  azithromycin (ZITHROMAX) 250 MG tablet, 2 tabs on 1st day and 1 tab daily for 4 more days, Disp: 6 tablet, Rfl: 0 chlorpheniramine-HYDROcodone (TUSSIONEX PENNKINETIC ER) 10-8 MG/5ML LQCR, Take 5 mLs by mouth every 12 (twelve) hours as needed for cough., Disp: 115  mL, Rfl: 0;  [DISCONTINUED] amLODipine (NORVASC) 5 MG tablet, Take 1 tablet (5 mg total) by mouth daily., Disp: 90 tablet, Rfl: 3;  [DISCONTINUED] gabapentin (NEURONTIN) 600 MG tablet, Take 600 mg by mouth 3 (three) times daily.  , Disp: , Rfl:   EXAM:  Filed Vitals:   11/08/13 1013  BP: 138/88  Pulse: 98  Temp: 99.1 F (37.3 C)    Body mass index is 21.79 kg/(m^2).  GENERAL: vitals reviewed and listed above, alert, oriented, appears well hydrated and in no acute distress  HEENT: atraumatic, conjunttiva clear, no obvious abnormalities on inspection of external nose and ears, normal appearance of ear canals and TMs, clear nasal congestion, mild post oropharyngeal erythema with PND, no tonsillar edema or exudate, no sinus TTP  NECK: no obvious masses on inspection  LUNGS: few wheezes and ? rhoncherous breath sound r base  CV: HRRR, no peripheral edema  MS: moves all extremities without noticeable abnormality  PSYCH: pleasant and cooperative, no obvious depression or anxiety  ASSESSMENT AND PLAN:  Discussed the following assessment and plan:  Respiratory infection - Plan: chlorpheniramine-HYDROcodone (TUSSIONEX PENNKINETIC ER) 10-8 MG/5ML LQCR, azithromycin (ZITHROMAX) 250 MG tablet  - We discussed potential etiologies, with bronchitis versus CAP being most likely. - We discussed treatment side effects, likely course, antibiotic, transmission, and signs of developing a serious illness. -NOTE: she reports she can take tussionex despite allergies and request this -of course, we advised to return or notify a doctor immediately if symptoms worsen or persist or new concerns arise.    Patient Instructions  -As we discussed, we have prescribed a new medication for you at this appointment. We discussed the common and serious potential adverse effects of this medication and you can review these and more with the pharmacist when you pick up your medication.  Please follow the  instructions for use carefully and notify us immediately if you have any problems taking this medication.  -follow up with your doctor if worsening, persists or other concerns     Kendra Johns R.

## 2013-11-09 ENCOUNTER — Telehealth: Payer: Self-pay | Admitting: Internal Medicine

## 2013-11-09 NOTE — Telephone Encounter (Signed)
Relevant patient education assigned to patient using Emmi. ° °

## 2013-11-10 ENCOUNTER — Encounter: Payer: Self-pay | Admitting: Hematology and Oncology

## 2013-11-13 ENCOUNTER — Ambulatory Visit: Payer: Self-pay

## 2013-11-14 ENCOUNTER — Ambulatory Visit: Payer: Self-pay

## 2013-11-20 ENCOUNTER — Ambulatory Visit: Payer: Self-pay

## 2013-11-21 ENCOUNTER — Ambulatory Visit: Payer: Self-pay

## 2013-11-27 ENCOUNTER — Ambulatory Visit (HOSPITAL_BASED_OUTPATIENT_CLINIC_OR_DEPARTMENT_OTHER): Payer: Medicare Other | Admitting: Hematology and Oncology

## 2013-11-27 ENCOUNTER — Other Ambulatory Visit (HOSPITAL_BASED_OUTPATIENT_CLINIC_OR_DEPARTMENT_OTHER): Payer: Medicare Other

## 2013-11-27 ENCOUNTER — Ambulatory Visit (HOSPITAL_BASED_OUTPATIENT_CLINIC_OR_DEPARTMENT_OTHER): Payer: Self-pay

## 2013-11-27 ENCOUNTER — Encounter: Payer: Self-pay | Admitting: Hematology and Oncology

## 2013-11-27 VITALS — BP 119/77 | HR 98 | Temp 98.2°F | Resp 20 | Ht 64.0 in | Wt 129.2 lb

## 2013-11-27 DIAGNOSIS — E559 Vitamin D deficiency, unspecified: Secondary | ICD-10-CM

## 2013-11-27 DIAGNOSIS — C9000 Multiple myeloma not having achieved remission: Secondary | ICD-10-CM

## 2013-11-27 DIAGNOSIS — F17200 Nicotine dependence, unspecified, uncomplicated: Secondary | ICD-10-CM

## 2013-11-27 DIAGNOSIS — Z5112 Encounter for antineoplastic immunotherapy: Secondary | ICD-10-CM

## 2013-11-27 LAB — COMPREHENSIVE METABOLIC PANEL (CC13)
ALT: 35 U/L (ref 0–55)
ANION GAP: 9 meq/L (ref 3–11)
AST: 45 U/L — ABNORMAL HIGH (ref 5–34)
Albumin: 3.5 g/dL (ref 3.5–5.0)
Alkaline Phosphatase: 123 U/L (ref 40–150)
BUN: 9.4 mg/dL (ref 7.0–26.0)
CALCIUM: 9.6 mg/dL (ref 8.4–10.4)
CHLORIDE: 108 meq/L (ref 98–109)
CO2: 22 meq/L (ref 22–29)
Creatinine: 0.7 mg/dL (ref 0.6–1.1)
Glucose: 115 mg/dl (ref 70–140)
Potassium: 3.9 mEq/L (ref 3.5–5.1)
SODIUM: 139 meq/L (ref 136–145)
TOTAL PROTEIN: 6.4 g/dL (ref 6.4–8.3)
Total Bilirubin: 0.45 mg/dL (ref 0.20–1.20)

## 2013-11-27 LAB — CBC WITH DIFFERENTIAL/PLATELET
BASO%: 0.3 % (ref 0.0–2.0)
Basophils Absolute: 0 10*3/uL (ref 0.0–0.1)
EOS%: 2.6 % (ref 0.0–7.0)
Eosinophils Absolute: 0.2 10*3/uL (ref 0.0–0.5)
HEMATOCRIT: 40.4 % (ref 34.8–46.6)
HGB: 13.5 g/dL (ref 11.6–15.9)
LYMPH#: 1.6 10*3/uL (ref 0.9–3.3)
LYMPH%: 26.5 % (ref 14.0–49.7)
MCH: 31 pg (ref 25.1–34.0)
MCHC: 33.4 g/dL (ref 31.5–36.0)
MCV: 92.9 fL (ref 79.5–101.0)
MONO#: 0.5 10*3/uL (ref 0.1–0.9)
MONO%: 8.7 % (ref 0.0–14.0)
NEUT#: 3.8 10*3/uL (ref 1.5–6.5)
NEUT%: 61.9 % (ref 38.4–76.8)
Platelets: 172 10*3/uL (ref 145–400)
RBC: 4.35 10*6/uL (ref 3.70–5.45)
RDW: 14.6 % — ABNORMAL HIGH (ref 11.2–14.5)
WBC: 6.2 10*3/uL (ref 3.9–10.3)
nRBC: 0 % (ref 0–0)

## 2013-11-27 MED ORDER — DEXTROSE 5 % IV SOLN
20.0000 mg/m2 | Freq: Once | INTRAVENOUS | Status: AC
  Administered 2013-11-27: 34 mg via INTRAVENOUS
  Filled 2013-11-27: qty 17

## 2013-11-27 MED ORDER — SODIUM CHLORIDE 0.9 % IV SOLN
Freq: Once | INTRAVENOUS | Status: AC
  Administered 2013-11-27: 12:00:00 via INTRAVENOUS

## 2013-11-27 MED ORDER — ONDANSETRON 8 MG/NS 50 ML IVPB
INTRAVENOUS | Status: AC
Start: 2013-11-27 — End: 2013-11-27
  Filled 2013-11-27: qty 8

## 2013-11-27 MED ORDER — DEXAMETHASONE 4 MG PO TABS: 8.0000 mg | ORAL_TABLET | Freq: Two times a day (BID) | ORAL | Status: DC

## 2013-11-27 MED ORDER — HEPARIN SOD (PORK) LOCK FLUSH 100 UNIT/ML IV SOLN
500.0000 [IU] | Freq: Once | INTRAVENOUS | Status: AC | PRN
  Administered 2013-11-27: 500 [IU]
  Filled 2013-11-27: qty 5

## 2013-11-27 MED ORDER — DEXAMETHASONE SODIUM PHOSPHATE 10 MG/ML IJ SOLN
10.0000 mg | Freq: Once | INTRAMUSCULAR | Status: AC
  Administered 2013-11-27: 10 mg via INTRAVENOUS

## 2013-11-27 MED ORDER — SODIUM CHLORIDE 0.9 % IJ SOLN
10.0000 mL | INTRAMUSCULAR | Status: DC | PRN
  Administered 2013-11-27: 10 mL
  Filled 2013-11-27: qty 10

## 2013-11-27 MED ORDER — ONDANSETRON 8 MG/50ML IVPB (CHCC)
8.0000 mg | Freq: Once | INTRAVENOUS | Status: AC
  Administered 2013-11-27: 8 mg via INTRAVENOUS

## 2013-11-27 MED ORDER — DEXAMETHASONE SODIUM PHOSPHATE 10 MG/ML IJ SOLN
INTRAMUSCULAR | Status: AC
  Filled 2013-11-27: qty 1

## 2013-11-27 NOTE — Patient Instructions (Signed)
[facility] Discharge Instructions for Patients Receiving Chemotherapy  Today you received the following chemotherapy agents Kyprolis.  To help prevent nausea and vomiting after your treatment, we encourage you to take your nausea medication Other compazine 10 mg as ordered by Dr. Alvy Bimler.   If you develop nausea and vomiting that is not controlled by your nausea medication, call the clinic.   BELOW ARE SYMPTOMS THAT SHOULD BE REPORTED IMMEDIATELY:  *FEVER GREATER THAN 100.5 F  *CHILLS WITH OR WITHOUT FEVER  NAUSEA AND VOMITING THAT IS NOT CONTROLLED WITH YOUR NAUSEA MEDICATION  *UNUSUAL SHORTNESS OF BREATH  *UNUSUAL BRUISING OR BLEEDING  TENDERNESS IN MOUTH AND THROAT WITH OR WITHOUT PRESENCE OF ULCERS  *URINARY PROBLEMS  *BOWEL PROBLEMS  UNUSUAL RASH Items with * indicate a potential emergency and should be followed up as soon as possible.  Feel free to call the clinic you have any questions or concerns. The clinic phone number is [phone].

## 2013-11-27 NOTE — Progress Notes (Signed)
Stood up, stretched legs.  Frowning and agreed to wheelchair.  Wheelchair obtained as she reports her legs feeling cramped from sitting so long

## 2013-11-27 NOTE — Progress Notes (Signed)
Called Dr. Alvy Bimler to inquire about the dexamethasone.  Verbal order received and read back that she is to take this as previously ordered, 8 mg weekly.  Patient notified to continue as ordered.

## 2013-11-27 NOTE — Progress Notes (Signed)
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   01/03/2007 -  Radiation Therapy Approximate date only, received RT for plasmacytoma followed by surgery   09/14/2007 Initial Diagnosis MULTIPLE  MYELOMA   10/05/2007 Bone Marrow Biopsy Bm biopsy was negative   06/18/2008 Bone Marrow Biopsy BM biopsy was negative   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 [facility]   04/13/2011 Relapse/Recurrence Disease relapse   04/14/2011 Bone Marrow Biopsy Bm biopsy showed 2 % plasma cell   04/27/2011 Relapse/Recurrence Disease relapse, treated with Velcade/Cytoxan/Dex   09/07/2011 - 07/28/2013 Chemotherapy She has been receiving Velcade   12/27/2011 Bone Marrow Transplant 2nd transplant at [facility]   07/28/2013 Relapse/Recurrence Chemo is stopped due to progression of disease   08/29/2013 Imaging PEt/CT showed recurrence of disease with new lesion on her rib with compression fracture   09/13/2013 Bone Marrow Biopsy BM biopsy is hypercellular with 6% plasma cell   10/10/2013 - 10/20/2013 Radiation Therapy Started on palliative XRT for rib pain   11/27/2013 -  Chemotherapy The patient starts chemotherapy with Carfilzomib    INTERVAL HISTORY: Kendra Johns 63 y.o. female returns for further followup. She is doing very well. The pain in her ribs continue to improve. Her appetite has improved. She still waiting for followup from [facility] regarding dental extraction. She is attempting to quit smoking, down to 1-2 cigarettes per day. She denies any recent fever, chills, night sweats or abnormal weight loss  I have reviewed the past medical history, past surgical history, social history and family history with the patient and they are unchanged from previous note.  ALLERGIES:  is allergic to codeine and ibuprofen.  MEDICATIONS:  Current Outpatient Prescriptions  Medication Sig Dispense Refill  . acyclovir (ZOVIRAX) 400 MG tablet Take 1 tablet (400 mg total) by mouth daily.  30 tablet  3  . albuterol (PROVENTIL HFA;VENTOLIN HFA) 108 (90 BASE) MCG/ACT inhaler Inhale 2 puffs into the lungs every 4 (four) hours as needed for wheezing or shortness of breath.  1 Inhaler  0  . chlorpheniramine-HYDROcodone (TUSSIONEX PENNKINETIC ER) 10-8 MG/5ML LQCR Take 5 mLs by mouth every 12 (twelve) hours as needed for cough.  115 mL  0  . Cholecalciferol (VITAMIN D-3) 5000 UNITS TABS Take 1 tablet by mouth every morning.      Marland Kitchen dexamethasone (DECADRON) 4 MG tablet Take 8 mg by mouth once a week. On Tuesdays      . diazepam (VALIUM) 5 MG tablet Take 5 mg by mouth every 12 (twelve) hours as needed for anxiety (or sleep).       Marland Kitchen lidocaine-prilocaine (EMLA) cream Apply 1 application topically as needed.  30 g  0  . lisinopril (PRINIVIL,ZESTRIL) 20 MG tablet Take 20 mg by mouth every morning.       . magnesium hydroxide (MILK OF MAGNESIA) 400 MG/5ML suspension Take 30 mLs by mouth daily as needed for mild constipation.      Marland Kitchen  morphine (MS CONTIN) 100 MG 12 hr tablet Take 100 mg by mouth 3 (three) times daily.       Marland Kitchen morphine (MS CONTIN) 30 MG 12 hr tablet Take 30 mg by mouth every 12 (twelve) hours.      . prochlorperazine (COMPAZINE) 10 MG tablet Take 10 mg by mouth every 6 (six) hours as needed for nausea.       Marland Kitchen azithromycin (ZITHROMAX) 250 MG tablet 2 tabs on 1st day and 1 tab daily for 4 more days  6 tablet  0  . dexamethasone  (DECADRON) 4 MG tablet Take 2 tablets (8 mg total) by mouth 2 (two) times daily with a meal. Take daily starting the day after chemotherapy for 2 days. Take with food.  30 tablet  1  . [DISCONTINUED] amLODipine (NORVASC) 5 MG tablet Take 1 tablet (5 mg total) by mouth daily.  90 tablet  3  . [DISCONTINUED] gabapentin (NEURONTIN) 600 MG tablet Take 600 mg by mouth 3 (three) times daily.         No current facility-administered medications for this visit.   Facility-Administered Medications Ordered in Other Visits  Medication Dose Route Frequency Provider Last Rate Last Dose  . 0.9 %  sodium chloride infusion   Intravenous Once Artis Delay, MD      . carfilzomib (KYPROLIS) 34 mg in dextrose 5 % 50 mL chemo infusion  20 mg/m2 (Treatment Plan Actual) Intravenous Once Artis Delay, MD      . dexamethasone (DECADRON) injection 10 mg  10 mg Intravenous Once Artis Delay, MD      . heparin lock flush 100 unit/mL  500 Units Intracatheter Once PRN Artis Delay, MD      . ondansetron (ZOFRAN) IVPB 8 mg  8 mg Intravenous Once Artis Delay, MD      . sodium chloride 0.9 % injection 10 mL  10 mL Intracatheter PRN Artis Delay, MD        REVIEW OF SYSTEMS:   Constitutional: Denies fevers, chills or abnormal weight loss Eyes: Denies blurriness of vision Ears, nose, mouth, throat, and face: Denies mucositis or sore throat Respiratory: Denies cough, dyspnea or wheezes Cardiovascular: Denies palpitation, chest discomfort or lower extremity swelling Gastrointestinal:  Denies nausea, heartburn or change in bowel habits Skin: Denies abnormal skin rashes Lymphatics: Denies new lymphadenopathy or easy bruising Neurological:Denies numbness, tingling or new weaknesses Behavioral/Psych: Mood is stable, no new changes  All other systems were reviewed with the patient and are negative.  PHYSICAL EXAMINATION: ECOG PERFORMANCE STATUS: 1 - Symptomatic but completely ambulatory  Filed Vitals:   11/27/13 1002  BP: 119/77   Pulse: 98  Temp: 98.2 F (36.8 C)  Resp: 20   Filed Weights   11/27/13 1002  Weight: 129 lb 3.2 oz (58.605 kg)    GENERAL:alert, no distress and comfortable SKIN: skin color, texture, turgor are normal, no rashes or significant lesions EYES: normal, Conjunctiva are pink and non-injected, sclera clear OROPHARYNX:no exudate, no erythema and lips, buccal mucosa, and tongue normal . Poor dentition is noted NECK: supple, thyroid normal size, non-tender, without nodularity LYMPH:  no palpable lymphadenopathy in the cervical, axillary or inguinal LUNGS: clear to auscultation and percussion with normal breathing effort HEART: regular rate & rhythm and no murmurs and no lower extremity edema ABDOMEN:abdomen soft, non-tender and normal bowel sounds Musculoskeletal:no cyanosis of digits and no clubbing  NEURO: alert & oriented x 3 with fluent speech, no focal motor/sensory deficits  LABORATORY DATA:  I  have reviewed the data as listed    Component Value Date/Time   NA 138 10/06/2013 1920   NA 139 08/07/2013 1150   K 4.3 10/06/2013 1920   K 3.9 08/07/2013 1150   CL 103 10/06/2013 1920   CL 107 03/10/2013 1014   CO2 25 10/06/2013 1920   CO2 24 08/07/2013 1150   GLUCOSE 99 10/06/2013 1920   GLUCOSE 94 08/07/2013 1150   GLUCOSE 111* 03/10/2013 1014   GLUCOSE 98 08/31/2006 1024   BUN 10 10/06/2013 1920   BUN 13.3 08/07/2013 1150   CREATININE 0.63 10/06/2013 1920   CREATININE 0.8 08/07/2013 1150   CALCIUM 9.8 10/06/2013 1920   CALCIUM 9.8 08/07/2013 1150   PROT 7.2 10/06/2013 1920   PROT 7.5 08/07/2013 1150   ALBUMIN 3.7 10/06/2013 1920   ALBUMIN 3.7 08/07/2013 1150   AST 16 10/06/2013 1920   AST 47* 08/07/2013 1150   ALT 11 10/06/2013 1920   ALT 47 08/07/2013 1150   ALKPHOS 100 10/06/2013 1920   ALKPHOS 84 08/07/2013 1150   BILITOT 0.3 10/06/2013 1920   BILITOT 0.37 08/07/2013 1150   GFRNONAA >90 10/06/2013 1920   GFRAA >90 10/06/2013 1920    No results found for this basename: SPEP,  UPEP,   kappa and  lambda light chains    Lab Results  Component Value Date   WBC 6.2 11/27/2013   NEUTROABS 3.8 11/27/2013   HGB 13.5 11/27/2013   HCT 40.4 11/27/2013   MCV 92.9 11/27/2013   PLT 172 11/27/2013      Chemistry      Component Value Date/Time   NA 138 10/06/2013 1920   NA 139 08/07/2013 1150   K 4.3 10/06/2013 1920   K 3.9 08/07/2013 1150   CL 103 10/06/2013 1920   CL 107 03/10/2013 1014   CO2 25 10/06/2013 1920   CO2 24 08/07/2013 1150   BUN 10 10/06/2013 1920   BUN 13.3 08/07/2013 1150   CREATININE 0.63 10/06/2013 1920   CREATININE 0.8 08/07/2013 1150      Component Value Date/Time   CALCIUM 9.8 10/06/2013 1920   CALCIUM 9.8 08/07/2013 1150   ALKPHOS 100 10/06/2013 1920   ALKPHOS 84 08/07/2013 1150   AST 16 10/06/2013 1920   AST 47* 08/07/2013 1150   ALT 11 10/06/2013 1920   ALT 47 08/07/2013 1150   BILITOT 0.3 10/06/2013 1920   BILITOT 0.37 08/07/2013 1150     ASSESSMENT & PLAN:  #1 multiple myeloma Clinically, since she received radiation therapy, she looked better. However she is still recovering from her recent rib fracture and her performance status is slowly improving Due to poor dentition, Zometa will not be given and until she gets dental clearance. I recommend she continue on pulse dose dexamethasone 8 mg once a week  We discussed the role of chemotherapy. The intent is for palliative.  We discussed some of the risks, benefits, side-effects of Carfilzomib  Some of the short term side-effects included, though not limited to, risk of fatigue, weight loss, pancytopenia, life-threatening infections, need for transfusions of blood products, nausea, vomiting, change in bowel habits, blood clots, admission to hospital for various reasons, and risks of death.   Long term side-effects are also discussed including risks of infertility, permanent damage to nerve function, chronic fatigue, and rare secondary malignancy including bone marrow disorders such as acute leukemia.   The patient is aware  that the response rates discussed earlier is not guaranteed.    After  a long discussion, patient made an informed decision to proceed with the prescribed plan of care and went ahead with treatment today #2 severe bone pain Her pain is under better control. She will continue followup with her primary doctor for medication adjustments . She'll continue vitamin D supplements #3 poor dentition I recommend she see her dentist after her pain is better controlled and she completes radiation treatment. We will start Zometa only after she obtained dental clearance #4 active smoking I congratulated her effort of attempting to quit smoking. I continue to encourage her. #5 antimicrobial prophylaxis She will continue acyclovir All questions were answered. The patient knows to call the clinic with any problems, questions or concerns. No barriers to learning was detected.    [place], [place], MD 11/27/2013 11:59 AM

## 2013-11-27 NOTE — Progress Notes (Signed)
1254 discharged with grand daughter to home in no distress.

## 2013-11-28 ENCOUNTER — Telehealth: Payer: Self-pay | Admitting: *Deleted

## 2013-11-28 ENCOUNTER — Ambulatory Visit (HOSPITAL_BASED_OUTPATIENT_CLINIC_OR_DEPARTMENT_OTHER): Payer: Medicare Other

## 2013-11-28 ENCOUNTER — Encounter: Payer: Self-pay | Admitting: Hematology and Oncology

## 2013-11-28 ENCOUNTER — Other Ambulatory Visit: Payer: Self-pay | Admitting: Hematology and Oncology

## 2013-11-28 VITALS — BP 105/74 | HR 103 | Temp 97.5°F | Resp 17

## 2013-11-28 DIAGNOSIS — E860 Dehydration: Secondary | ICD-10-CM

## 2013-11-28 DIAGNOSIS — Z5112 Encounter for antineoplastic immunotherapy: Secondary | ICD-10-CM

## 2013-11-28 DIAGNOSIS — R509 Fever, unspecified: Secondary | ICD-10-CM

## 2013-11-28 DIAGNOSIS — C9000 Multiple myeloma not having achieved remission: Secondary | ICD-10-CM

## 2013-11-28 HISTORY — DX: Dehydration: E86.0

## 2013-11-28 HISTORY — DX: Fever, unspecified: R50.9

## 2013-11-28 MED ORDER — SODIUM CHLORIDE 0.9 % IV SOLN: Freq: Once | INTRAVENOUS | Status: DC

## 2013-11-28 MED ORDER — SODIUM CHLORIDE 0.9 % IV SOLN
INTRAVENOUS | Status: AC
  Administered 2013-11-28: 12:00:00 via INTRAVENOUS

## 2013-11-28 MED ORDER — ONDANSETRON 8 MG/NS 50 ML IVPB
INTRAVENOUS | Status: AC
  Filled 2013-11-28: qty 8

## 2013-11-28 MED ORDER — HEPARIN SOD (PORK) LOCK FLUSH 100 UNIT/ML IV SOLN
500.0000 [IU] | Freq: Once | INTRAVENOUS | Status: AC | PRN
  Administered 2013-11-28: 500 [IU]
  Filled 2013-11-28: qty 5

## 2013-11-28 MED ORDER — DEXAMETHASONE SODIUM PHOSPHATE 10 MG/ML IJ SOLN
10.0000 mg | Freq: Once | INTRAMUSCULAR | Status: AC
  Administered 2013-11-28: 10 mg via INTRAVENOUS

## 2013-11-28 MED ORDER — SODIUM CHLORIDE 0.9 % IJ SOLN
10.0000 mL | INTRAMUSCULAR | Status: DC | PRN
  Administered 2013-11-28: 10 mL
  Filled 2013-11-28: qty 10

## 2013-11-28 MED ORDER — DEXTROSE 5 % IV SOLN
20.0000 mg/m2 | Freq: Once | INTRAVENOUS | Status: AC
  Administered 2013-11-28: 34 mg via INTRAVENOUS
  Filled 2013-11-28: qty 17

## 2013-11-28 MED ORDER — ONDANSETRON 8 MG/50ML IVPB (CHCC)
8.0000 mg | Freq: Once | INTRAVENOUS | Status: AC
  Administered 2013-11-28: 8 mg via INTRAVENOUS

## 2013-11-28 MED ORDER — AMOXICILLIN 500 MG PO TABS: 500.0000 mg | ORAL_TABLET | Freq: Two times a day (BID) | ORAL | Status: DC

## 2013-11-28 MED ORDER — DEXAMETHASONE SODIUM PHOSPHATE 10 MG/ML IJ SOLN
INTRAMUSCULAR | Status: AC
  Filled 2013-11-28: qty 1

## 2013-11-28 MED ORDER — SODIUM CHLORIDE 0.9 % IV SOLN
Freq: Once | INTRAVENOUS | Status: AC
  Administered 2013-11-28: 12:00:00 via INTRAVENOUS

## 2013-11-28 NOTE — Progress Notes (Signed)
Pt in for chemo when she stated that she had a fever of 103.0 overnight.  Pt did not take any medication to lower fever nor did she call the clinic.  She stated that she had one episode of vomiting and she is having pain in her left lower abdomen (this is chronic pain).  Pt does not feel dehydrated but b/p is slightly lower than normal and pulse is slightly higher. RN spoke with MD.  Orders given for antibiotics at home (espcript) and a Liter of NS here in the clinic with chemo.  Pt given instructions and restated to RN that she will pick up prescription after chemo today.  Pt encouraged to go to dentist due to teeth problems per MD.

## 2013-11-28 NOTE — Telephone Encounter (Signed)
Lm gv appts for 12/04/13 to hav labs@ 10am, 2/915 labs@ 10:30am, and on 12/25/13 w/labs@ 9:15am and ov@ 9:45am. Made the pt aware that i will mail a letter/avs...td

## 2013-11-28 NOTE — Patient Instructions (Signed)
[facility] Discharge Instructions for Patients Receiving Chemotherapy  Today you received the following chemotherapy agents Kyprolis  To help prevent nausea and vomiting after your treatment, we encourage you to take your nausea medication as directed/prescribed   If you develop nausea and vomiting that is not controlled by your nausea medication, call the clinic.   BELOW ARE SYMPTOMS THAT SHOULD BE REPORTED IMMEDIATELY:  *FEVER GREATER THAN 100.5 F  *CHILLS WITH OR WITHOUT FEVER  NAUSEA AND VOMITING THAT IS NOT CONTROLLED WITH YOUR NAUSEA MEDICATION  *UNUSUAL SHORTNESS OF BREATH  *UNUSUAL BRUISING OR BLEEDING  TENDERNESS IN MOUTH AND THROAT WITH OR WITHOUT PRESENCE OF ULCERS  *URINARY PROBLEMS  *BOWEL PROBLEMS  UNUSUAL RASH Items with * indicate a potential emergency and should be followed up as soon as possible.  Feel free to call the clinic you have any questions or concerns. The clinic phone number is [phone].    

## 2013-11-29 ENCOUNTER — Telehealth: Payer: Self-pay | Admitting: *Deleted

## 2013-11-29 NOTE — Telephone Encounter (Signed)
Called Kendra Johns for chemotherapy F/U.  Patient is doing well.  Denies n/v.  Denies any new side effects or symptoms.  Bowels moved today.  Reports having "forgotten about bowels with all that's going on.  I think that's what made me feel so sick the first night after treatment".  Reviewed how to follow a regular bowel regimen due to being on pain medicines.  Bladder is functioning well.  Eating and drinking well and I instructed to drink 64 oz minimum daily or at least the day before, of and after treatment.  Denies questions at this time and encouraged to call if needed.  Reviewed how to call after hours in the case of an emergency.

## 2013-11-29 NOTE — Telephone Encounter (Signed)
Message copied by Cherylynn Ridges on Wed Nov 29, 2013  2:07 PM ------      Message from: Cherylynn Ridges      Created: Mon Nov 27, 2013 12:21 PM      Regarding: Chemotherapy f/u       Dr. Alvy Bimler  New regimen of Kyprolis ------

## 2013-12-01 LAB — BETA 2 MICROGLOBULIN, SERUM: Beta-2 Microglobulin: 2.57 mg/L — ABNORMAL HIGH (ref 1.01–1.73)

## 2013-12-01 LAB — SPEP & IFE WITH QIG
Albumin ELP: 57.2 % (ref 55.8–66.1)
Alpha-1-Globulin: 5.7 % — ABNORMAL HIGH (ref 2.9–4.9)
Alpha-2-Globulin: 13.8 % — ABNORMAL HIGH (ref 7.1–11.8)
Beta 2: 3.7 % (ref 3.2–6.5)
Beta Globulin: 6 % (ref 4.7–7.2)
Gamma Globulin: 13.6 % (ref 11.1–18.8)
IgA: 43 mg/dL — ABNORMAL LOW (ref 69–380)
IgG (Immunoglobin G), Serum: 901 mg/dL (ref 690–1700)
IgM, Serum: 68 mg/dL (ref 52–322)
M-Spike, %: 0.22 g/dL
Total Protein, Serum Electrophoresis: 6.2 g/dL (ref 6.0–8.3)

## 2013-12-01 LAB — KAPPA/LAMBDA LIGHT CHAINS
Kappa free light chain: 322 mg/dL — ABNORMAL HIGH (ref 0.33–1.94)
Kappa:Lambda Ratio: 236.76 — ABNORMAL HIGH (ref 0.26–1.65)
Lambda Free Lght Chn: 1.36 mg/dL (ref 0.57–2.63)

## 2013-12-04 ENCOUNTER — Ambulatory Visit (HOSPITAL_BASED_OUTPATIENT_CLINIC_OR_DEPARTMENT_OTHER): Payer: Medicare Other

## 2013-12-04 ENCOUNTER — Other Ambulatory Visit (HOSPITAL_BASED_OUTPATIENT_CLINIC_OR_DEPARTMENT_OTHER): Payer: Medicare Other

## 2013-12-04 VITALS — BP 151/96 | HR 89 | Temp 98.6°F | Resp 20

## 2013-12-04 DIAGNOSIS — Z5112 Encounter for antineoplastic immunotherapy: Secondary | ICD-10-CM

## 2013-12-04 DIAGNOSIS — E559 Vitamin D deficiency, unspecified: Secondary | ICD-10-CM

## 2013-12-04 DIAGNOSIS — C9000 Multiple myeloma not having achieved remission: Secondary | ICD-10-CM

## 2013-12-04 LAB — CBC WITH DIFFERENTIAL/PLATELET
BASO%: 0.2 % (ref 0.0–2.0)
BASOS ABS: 0 10*3/uL (ref 0.0–0.1)
EOS%: 1.6 % (ref 0.0–7.0)
Eosinophils Absolute: 0.1 10*3/uL (ref 0.0–0.5)
HCT: 38 % (ref 34.8–46.6)
HEMOGLOBIN: 12.7 g/dL (ref 11.6–15.9)
LYMPH%: 22.1 % (ref 14.0–49.7)
MCH: 31.1 pg (ref 25.1–34.0)
MCHC: 33.4 g/dL (ref 31.5–36.0)
MCV: 93.1 fL (ref 79.5–101.0)
MONO#: 0.7 10*3/uL (ref 0.1–0.9)
MONO%: 11.1 % (ref 0.0–14.0)
NEUT#: 4.1 10*3/uL (ref 1.5–6.5)
NEUT%: 65 % (ref 38.4–76.8)
Platelets: 132 10*3/uL — ABNORMAL LOW (ref 145–400)
RBC: 4.08 10*6/uL (ref 3.70–5.45)
RDW: 15.1 % — AB (ref 11.2–14.5)
WBC: 6.3 10*3/uL (ref 3.9–10.3)
lymph#: 1.4 10*3/uL (ref 0.9–3.3)

## 2013-12-04 LAB — COMPREHENSIVE METABOLIC PANEL (CC13)
ALT: 21 U/L (ref 0–55)
AST: 26 U/L (ref 5–34)
Albumin: 3.3 g/dL — ABNORMAL LOW (ref 3.5–5.0)
Alkaline Phosphatase: 119 U/L (ref 40–150)
Anion Gap: 9 mEq/L (ref 3–11)
BUN: 10.1 mg/dL (ref 7.0–26.0)
CHLORIDE: 108 meq/L (ref 98–109)
CO2: 20 mEq/L — ABNORMAL LOW (ref 22–29)
CREATININE: 0.7 mg/dL (ref 0.6–1.1)
Calcium: 9.1 mg/dL (ref 8.4–10.4)
Glucose: 139 mg/dl (ref 70–140)
POTASSIUM: 4.1 meq/L (ref 3.5–5.1)
SODIUM: 137 meq/L (ref 136–145)
Total Bilirubin: 0.52 mg/dL (ref 0.20–1.20)
Total Protein: 6.2 g/dL — ABNORMAL LOW (ref 6.4–8.3)

## 2013-12-04 MED ORDER — DEXAMETHASONE SODIUM PHOSPHATE 10 MG/ML IJ SOLN
10.0000 mg | Freq: Once | INTRAMUSCULAR | Status: AC
  Administered 2013-12-04: 10 mg via INTRAVENOUS

## 2013-12-04 MED ORDER — DEXAMETHASONE SODIUM PHOSPHATE 10 MG/ML IJ SOLN
INTRAMUSCULAR | Status: AC
  Filled 2013-12-04: qty 1

## 2013-12-04 MED ORDER — SODIUM CHLORIDE 0.9 % IV SOLN: Freq: Once | INTRAVENOUS | Status: DC

## 2013-12-04 MED ORDER — DEXTROSE 5 % IV SOLN
20.0000 mg/m2 | Freq: Once | INTRAVENOUS | Status: AC
  Administered 2013-12-04: 34 mg via INTRAVENOUS
  Filled 2013-12-04: qty 17

## 2013-12-04 MED ORDER — SODIUM CHLORIDE 0.9 % IV SOLN
Freq: Once | INTRAVENOUS | Status: AC
  Administered 2013-12-04: 11:00:00 via INTRAVENOUS

## 2013-12-04 MED ORDER — HEPARIN SOD (PORK) LOCK FLUSH 100 UNIT/ML IV SOLN
500.0000 [IU] | Freq: Once | INTRAVENOUS | Status: AC | PRN
  Administered 2013-12-04: 500 [IU]
  Filled 2013-12-04: qty 5

## 2013-12-04 MED ORDER — ONDANSETRON 8 MG/50ML IVPB (CHCC)
8.0000 mg | Freq: Once | INTRAVENOUS | Status: AC
  Administered 2013-12-04: 8 mg via INTRAVENOUS

## 2013-12-04 MED ORDER — ONDANSETRON 8 MG/NS 50 ML IVPB
INTRAVENOUS | Status: AC
  Filled 2013-12-04: qty 8

## 2013-12-04 MED ORDER — SODIUM CHLORIDE 0.9 % IJ SOLN
10.0000 mL | INTRAMUSCULAR | Status: DC | PRN
  Administered 2013-12-04: 10 mL
  Filled 2013-12-04: qty 10

## 2013-12-04 NOTE — Patient Instructions (Signed)
[facility] Discharge Instructions for Patients Receiving Chemotherapy  Today you received the following chemotherapy agents KYPROLIS To help prevent nausea and vomiting after your treatment, we encourage you to take your nausea medication as prescribed. If you develop nausea and vomiting that is not controlled by your nausea medication, call the clinic.   BELOW ARE SYMPTOMS THAT SHOULD BE REPORTED IMMEDIATELY:  *FEVER GREATER THAN 100.5 F  *CHILLS WITH OR WITHOUT FEVER  NAUSEA AND VOMITING THAT IS NOT CONTROLLED WITH YOUR NAUSEA MEDICATION  *UNUSUAL SHORTNESS OF BREATH  *UNUSUAL BRUISING OR BLEEDING  TENDERNESS IN MOUTH AND THROAT WITH OR WITHOUT PRESENCE OF ULCERS  *URINARY PROBLEMS  *BOWEL PROBLEMS  UNUSUAL RASH Items with * indicate a potential emergency and should be followed up as soon as possible.  Feel free to call the clinic you have any questions or concerns. The clinic phone number is [phone].    

## 2013-12-05 ENCOUNTER — Ambulatory Visit (HOSPITAL_BASED_OUTPATIENT_CLINIC_OR_DEPARTMENT_OTHER): Payer: Medicare Other

## 2013-12-05 VITALS — BP 128/81 | HR 75 | Temp 98.1°F | Resp 20

## 2013-12-05 DIAGNOSIS — C9000 Multiple myeloma not having achieved remission: Secondary | ICD-10-CM

## 2013-12-05 DIAGNOSIS — Z5112 Encounter for antineoplastic immunotherapy: Secondary | ICD-10-CM

## 2013-12-05 MED ORDER — ONDANSETRON 8 MG/NS 50 ML IVPB
INTRAVENOUS | Status: AC
  Filled 2013-12-05: qty 8

## 2013-12-05 MED ORDER — SODIUM CHLORIDE 0.9 % IV SOLN
Freq: Once | INTRAVENOUS | Status: AC
  Administered 2013-12-05: 12:00:00 via INTRAVENOUS

## 2013-12-05 MED ORDER — DEXAMETHASONE SODIUM PHOSPHATE 10 MG/ML IJ SOLN
10.0000 mg | Freq: Once | INTRAMUSCULAR | Status: AC
  Administered 2013-12-05: 10 mg via INTRAVENOUS

## 2013-12-05 MED ORDER — DEXTROSE 5 % IV SOLN
20.0000 mg/m2 | Freq: Once | INTRAVENOUS | Status: AC
  Administered 2013-12-05: 34 mg via INTRAVENOUS
  Filled 2013-12-05: qty 17

## 2013-12-05 MED ORDER — HEPARIN SOD (PORK) LOCK FLUSH 100 UNIT/ML IV SOLN
500.0000 [IU] | Freq: Once | INTRAVENOUS | Status: AC | PRN
  Administered 2013-12-05: 500 [IU]
  Filled 2013-12-05: qty 5

## 2013-12-05 MED ORDER — ONDANSETRON 8 MG/50ML IVPB (CHCC)
8.0000 mg | Freq: Once | INTRAVENOUS | Status: AC
  Administered 2013-12-05: 8 mg via INTRAVENOUS

## 2013-12-05 MED ORDER — DEXAMETHASONE SODIUM PHOSPHATE 10 MG/ML IJ SOLN
INTRAMUSCULAR | Status: AC
  Filled 2013-12-05: qty 1

## 2013-12-05 MED ORDER — SODIUM CHLORIDE 0.9 % IJ SOLN
10.0000 mL | INTRAMUSCULAR | Status: DC | PRN
  Administered 2013-12-05: 10 mL
  Filled 2013-12-05: qty 10

## 2013-12-05 NOTE — Patient Instructions (Signed)
[facility] Discharge Instructions for Patients Receiving Chemotherapy  Today you received the following chemotherapy agents Kyprolis  To help prevent nausea and vomiting after your treatment, we encourage you to take your nausea medication as directed/perscribed.    If you develop nausea and vomiting that is not controlled by your nausea medication, call the clinic.   BELOW ARE SYMPTOMS THAT SHOULD BE REPORTED IMMEDIATELY:  *FEVER GREATER THAN 100.5 F  *CHILLS WITH OR WITHOUT FEVER  NAUSEA AND VOMITING THAT IS NOT CONTROLLED WITH YOUR NAUSEA MEDICATION  *UNUSUAL SHORTNESS OF BREATH  *UNUSUAL BRUISING OR BLEEDING  TENDERNESS IN MOUTH AND THROAT WITH OR WITHOUT PRESENCE OF ULCERS  *URINARY PROBLEMS  *BOWEL PROBLEMS  UNUSUAL RASH Items with * indicate a potential emergency and should be followed up as soon as possible.  Feel free to call the clinic you have any questions or concerns. The clinic phone number is [phone].

## 2013-12-11 ENCOUNTER — Ambulatory Visit (HOSPITAL_BASED_OUTPATIENT_CLINIC_OR_DEPARTMENT_OTHER): Payer: Medicare Other

## 2013-12-11 ENCOUNTER — Other Ambulatory Visit (HOSPITAL_BASED_OUTPATIENT_CLINIC_OR_DEPARTMENT_OTHER): Payer: Medicare Other

## 2013-12-11 VITALS — BP 138/87 | HR 87 | Temp 97.4°F | Resp 18

## 2013-12-11 DIAGNOSIS — C9000 Multiple myeloma not having achieved remission: Secondary | ICD-10-CM

## 2013-12-11 DIAGNOSIS — E559 Vitamin D deficiency, unspecified: Secondary | ICD-10-CM

## 2013-12-11 DIAGNOSIS — Z5112 Encounter for antineoplastic immunotherapy: Secondary | ICD-10-CM

## 2013-12-11 LAB — COMPREHENSIVE METABOLIC PANEL (CC13)
ALT: 27 U/L (ref 0–55)
ANION GAP: 7 meq/L (ref 3–11)
AST: 34 U/L (ref 5–34)
Albumin: 3.4 g/dL — ABNORMAL LOW (ref 3.5–5.0)
Alkaline Phosphatase: 186 U/L — ABNORMAL HIGH (ref 40–150)
BUN: 12.8 mg/dL (ref 7.0–26.0)
CO2: 21 mEq/L — ABNORMAL LOW (ref 22–29)
Calcium: 9.2 mg/dL (ref 8.4–10.4)
Chloride: 112 mEq/L — ABNORMAL HIGH (ref 98–109)
Creatinine: 0.7 mg/dL (ref 0.6–1.1)
Glucose: 121 mg/dl (ref 70–140)
Potassium: 3.9 mEq/L (ref 3.5–5.1)
SODIUM: 140 meq/L (ref 136–145)
TOTAL PROTEIN: 6.3 g/dL — AB (ref 6.4–8.3)
Total Bilirubin: 0.55 mg/dL (ref 0.20–1.20)

## 2013-12-11 LAB — CBC WITH DIFFERENTIAL/PLATELET
BASO%: 0.2 % (ref 0.0–2.0)
Basophils Absolute: 0 10*3/uL (ref 0.0–0.1)
EOS%: 2.6 % (ref 0.0–7.0)
Eosinophils Absolute: 0.1 10*3/uL (ref 0.0–0.5)
HCT: 38.3 % (ref 34.8–46.6)
HGB: 12.8 g/dL (ref 11.6–15.9)
LYMPH%: 23.6 % (ref 14.0–49.7)
MCH: 31.1 pg (ref 25.1–34.0)
MCHC: 33.4 g/dL (ref 31.5–36.0)
MCV: 93.2 fL (ref 79.5–101.0)
MONO#: 0.5 10*3/uL (ref 0.1–0.9)
MONO%: 9.5 % (ref 0.0–14.0)
NEUT#: 3.5 10*3/uL (ref 1.5–6.5)
NEUT%: 64.1 % (ref 38.4–76.8)
PLATELETS: 147 10*3/uL (ref 145–400)
RBC: 4.11 10*6/uL (ref 3.70–5.45)
RDW: 15.7 % — ABNORMAL HIGH (ref 11.2–14.5)
WBC: 5.5 10*3/uL (ref 3.9–10.3)
lymph#: 1.3 10*3/uL (ref 0.9–3.3)

## 2013-12-11 MED ORDER — ONDANSETRON 8 MG/NS 50 ML IVPB
INTRAVENOUS | Status: AC
  Filled 2013-12-11: qty 8

## 2013-12-11 MED ORDER — DEXTROSE 5 % IV SOLN
20.0000 mg/m2 | Freq: Once | INTRAVENOUS | Status: AC
  Administered 2013-12-11: 34 mg via INTRAVENOUS
  Filled 2013-12-11: qty 17

## 2013-12-11 MED ORDER — SODIUM CHLORIDE 0.9 % IV SOLN
Freq: Once | INTRAVENOUS | Status: AC
  Administered 2013-12-11: 12:00:00 via INTRAVENOUS

## 2013-12-11 MED ORDER — DEXAMETHASONE SODIUM PHOSPHATE 10 MG/ML IJ SOLN
10.0000 mg | Freq: Once | INTRAMUSCULAR | Status: AC
  Administered 2013-12-11: 10 mg via INTRAVENOUS

## 2013-12-11 MED ORDER — HEPARIN SOD (PORK) LOCK FLUSH 100 UNIT/ML IV SOLN
500.0000 [IU] | Freq: Once | INTRAVENOUS | Status: AC | PRN
  Administered 2013-12-11: 500 [IU]
  Filled 2013-12-11: qty 5

## 2013-12-11 MED ORDER — SODIUM CHLORIDE 0.9 % IJ SOLN
10.0000 mL | INTRAMUSCULAR | Status: DC | PRN
  Administered 2013-12-11: 10 mL
  Filled 2013-12-11: qty 10

## 2013-12-11 MED ORDER — ONDANSETRON 8 MG/50ML IVPB (CHCC)
8.0000 mg | Freq: Once | INTRAVENOUS | Status: AC
  Administered 2013-12-11: 8 mg via INTRAVENOUS

## 2013-12-11 MED ORDER — DEXAMETHASONE SODIUM PHOSPHATE 10 MG/ML IJ SOLN
INTRAMUSCULAR | Status: AC
  Filled 2013-12-11: qty 1

## 2013-12-11 NOTE — Progress Notes (Signed)
Pt reports she did not take BP meds this am.

## 2013-12-11 NOTE — Patient Instructions (Signed)
[facility] Discharge Instructions for Patients Receiving Chemotherapy  Today you received the following chemotherapy agents: kyprolis  To help prevent nausea and vomiting after your treatment, we encourage you to take your nausea medication.  Take it as often as prescribed.     If you develop nausea and vomiting that is not controlled by your nausea medication, call the clinic. If it is after clinic hours your family physician or the after hours number for the clinic or go to the Emergency Department.   BELOW ARE SYMPTOMS THAT SHOULD BE REPORTED IMMEDIATELY:  *FEVER GREATER THAN 100.5 F  *CHILLS WITH OR WITHOUT FEVER  NAUSEA AND VOMITING THAT IS NOT CONTROLLED WITH YOUR NAUSEA MEDICATION  *UNUSUAL SHORTNESS OF BREATH  *UNUSUAL BRUISING OR BLEEDING  TENDERNESS IN MOUTH AND THROAT WITH OR WITHOUT PRESENCE OF ULCERS  *URINARY PROBLEMS  *BOWEL PROBLEMS  UNUSUAL RASH Items with * indicate a potential emergency and should be followed up as soon as possible.  Feel free to call the clinic you have any questions or concerns. The clinic phone number is [phone].   I have been informed and understand all the instructions given to me. I know to contact the clinic, my physician, or go to the Emergency Department if any problems should occur. I do not have any questions at this time, but understand that I may call the clinic during office hours   should I have any questions or need assistance in obtaining follow up care.    __________________________________________  _____________  __________ Signature of Patient or Authorized Representative            Date                   Time    __________________________________________ Nurse's Signature    

## 2013-12-12 ENCOUNTER — Ambulatory Visit (HOSPITAL_BASED_OUTPATIENT_CLINIC_OR_DEPARTMENT_OTHER): Payer: Medicare Other

## 2013-12-12 VITALS — BP 152/98 | HR 90 | Temp 98.5°F | Resp 18

## 2013-12-12 DIAGNOSIS — C9000 Multiple myeloma not having achieved remission: Secondary | ICD-10-CM

## 2013-12-12 DIAGNOSIS — Z5112 Encounter for antineoplastic immunotherapy: Secondary | ICD-10-CM

## 2013-12-12 MED ORDER — SODIUM CHLORIDE 0.9 % IV SOLN
Freq: Once | INTRAVENOUS | Status: AC
  Administered 2013-12-12: 13:00:00 via INTRAVENOUS

## 2013-12-12 MED ORDER — DEXTROSE 5 % IV SOLN
20.0000 mg/m2 | Freq: Once | INTRAVENOUS | Status: AC
  Administered 2013-12-12: 34 mg via INTRAVENOUS
  Filled 2013-12-12: qty 17

## 2013-12-12 MED ORDER — SODIUM CHLORIDE 0.9 % IJ SOLN
10.0000 mL | INTRAMUSCULAR | Status: DC | PRN
  Administered 2013-12-12: 10 mL
  Filled 2013-12-12: qty 10

## 2013-12-12 MED ORDER — ONDANSETRON 8 MG/NS 50 ML IVPB
INTRAVENOUS | Status: AC
  Filled 2013-12-12: qty 8

## 2013-12-12 MED ORDER — SODIUM CHLORIDE 0.9 % IV SOLN
Freq: Once | INTRAVENOUS | Status: AC
  Administered 2013-12-12: 11:00:00 via INTRAVENOUS

## 2013-12-12 MED ORDER — DEXAMETHASONE SODIUM PHOSPHATE 10 MG/ML IJ SOLN
INTRAMUSCULAR | Status: AC
Start: 2013-12-12 — End: 2013-12-12
  Filled 2013-12-12: qty 1

## 2013-12-12 MED ORDER — ONDANSETRON 8 MG/50ML IVPB (CHCC)
8.0000 mg | Freq: Once | INTRAVENOUS | Status: AC
  Administered 2013-12-12: 8 mg via INTRAVENOUS

## 2013-12-12 MED ORDER — HEPARIN SOD (PORK) LOCK FLUSH 100 UNIT/ML IV SOLN
500.0000 [IU] | Freq: Once | INTRAVENOUS | Status: AC | PRN
  Administered 2013-12-12: 500 [IU]
  Filled 2013-12-12: qty 5

## 2013-12-12 MED ORDER — DEXAMETHASONE SODIUM PHOSPHATE 10 MG/ML IJ SOLN
10.0000 mg | Freq: Once | INTRAMUSCULAR | Status: AC
  Administered 2013-12-12: 10 mg via INTRAVENOUS

## 2013-12-12 NOTE — Patient Instructions (Signed)
[facility] Discharge Instructions for Patients Receiving Chemotherapy  Today you received the following chemotherapy agents:  Kyprolis  To help prevent nausea and vomiting after your treatment, we encourage you to take your nausea medication as ordered per MD.   If you develop nausea and vomiting that is not controlled by your nausea medication, call the clinic.   BELOW ARE SYMPTOMS THAT SHOULD BE REPORTED IMMEDIATELY:  *FEVER GREATER THAN 100.5 F  *CHILLS WITH OR WITHOUT FEVER  NAUSEA AND VOMITING THAT IS NOT CONTROLLED WITH YOUR NAUSEA MEDICATION  *UNUSUAL SHORTNESS OF BREATH  *UNUSUAL BRUISING OR BLEEDING  TENDERNESS IN MOUTH AND THROAT WITH OR WITHOUT PRESENCE OF ULCERS  *URINARY PROBLEMS  *BOWEL PROBLEMS  UNUSUAL RASH Items with * indicate a potential emergency and should be followed up as soon as possible.  Feel free to call the clinic you have any questions or concerns. The clinic phone number is [phone].

## 2013-12-25 ENCOUNTER — Other Ambulatory Visit (HOSPITAL_BASED_OUTPATIENT_CLINIC_OR_DEPARTMENT_OTHER): Payer: Medicare Other

## 2013-12-25 ENCOUNTER — Telehealth: Payer: Self-pay | Admitting: *Deleted

## 2013-12-25 ENCOUNTER — Ambulatory Visit (HOSPITAL_BASED_OUTPATIENT_CLINIC_OR_DEPARTMENT_OTHER): Payer: Medicare Other | Admitting: Hematology and Oncology

## 2013-12-25 ENCOUNTER — Ambulatory Visit (HOSPITAL_BASED_OUTPATIENT_CLINIC_OR_DEPARTMENT_OTHER): Payer: Medicare Other

## 2013-12-25 ENCOUNTER — Telehealth: Payer: Self-pay | Admitting: Hematology and Oncology

## 2013-12-25 ENCOUNTER — Other Ambulatory Visit: Payer: Self-pay | Admitting: Hematology and Oncology

## 2013-12-25 VITALS — BP 154/94 | HR 89 | Temp 98.0°F | Resp 20 | Ht 64.0 in | Wt 129.9 lb

## 2013-12-25 DIAGNOSIS — C9000 Multiple myeloma not having achieved remission: Secondary | ICD-10-CM

## 2013-12-25 DIAGNOSIS — E559 Vitamin D deficiency, unspecified: Secondary | ICD-10-CM

## 2013-12-25 DIAGNOSIS — F17200 Nicotine dependence, unspecified, uncomplicated: Secondary | ICD-10-CM

## 2013-12-25 DIAGNOSIS — Z5112 Encounter for antineoplastic immunotherapy: Secondary | ICD-10-CM

## 2013-12-25 LAB — COMPREHENSIVE METABOLIC PANEL (CC13)
ALBUMIN: 3.7 g/dL (ref 3.5–5.0)
ALK PHOS: 201 U/L — AB (ref 40–150)
ALT: 21 U/L (ref 0–55)
AST: 28 U/L (ref 5–34)
Anion Gap: 12 mEq/L — ABNORMAL HIGH (ref 3–11)
BUN: 12.5 mg/dL (ref 7.0–26.0)
CALCIUM: 9.3 mg/dL (ref 8.4–10.4)
CHLORIDE: 110 meq/L — AB (ref 98–109)
CO2: 19 mEq/L — ABNORMAL LOW (ref 22–29)
Creatinine: 0.7 mg/dL (ref 0.6–1.1)
Glucose: 137 mg/dl (ref 70–140)
POTASSIUM: 4 meq/L (ref 3.5–5.1)
Sodium: 141 mEq/L (ref 136–145)
Total Bilirubin: 0.6 mg/dL (ref 0.20–1.20)
Total Protein: 6.6 g/dL (ref 6.4–8.3)

## 2013-12-25 LAB — CBC WITH DIFFERENTIAL/PLATELET
BASO%: 1.2 % (ref 0.0–2.0)
Basophils Absolute: 0.1 10*3/uL (ref 0.0–0.1)
EOS ABS: 0.1 10*3/uL (ref 0.0–0.5)
EOS%: 2.7 % (ref 0.0–7.0)
HCT: 41.8 % (ref 34.8–46.6)
HGB: 13.9 g/dL (ref 11.6–15.9)
LYMPH%: 27.1 % (ref 14.0–49.7)
MCH: 31.8 pg (ref 25.1–34.0)
MCHC: 33.2 g/dL (ref 31.5–36.0)
MCV: 95.8 fL (ref 79.5–101.0)
MONO#: 0.5 10*3/uL (ref 0.1–0.9)
MONO%: 8.7 % (ref 0.0–14.0)
NEUT%: 60.3 % (ref 38.4–76.8)
NEUTROS ABS: 3.2 10*3/uL (ref 1.5–6.5)
PLATELETS: 174 10*3/uL (ref 145–400)
RBC: 4.36 10*6/uL (ref 3.70–5.45)
RDW: 16.6 % — AB (ref 11.2–14.5)
WBC: 5.3 10*3/uL (ref 3.9–10.3)
lymph#: 1.4 10*3/uL (ref 0.9–3.3)

## 2013-12-25 MED ORDER — SODIUM CHLORIDE 0.9 % IV SOLN
Freq: Once | INTRAVENOUS | Status: AC
Start: 2013-12-25 — End: 2013-12-25
  Administered 2013-12-25: 16:00:00 via INTRAVENOUS

## 2013-12-25 MED ORDER — DEXAMETHASONE SODIUM PHOSPHATE 10 MG/ML IJ SOLN
10.0000 mg | Freq: Once | INTRAMUSCULAR | Status: AC
  Administered 2013-12-25: 10 mg via INTRAVENOUS

## 2013-12-25 MED ORDER — SODIUM CHLORIDE 0.9 % IJ SOLN
10.0000 mL | INTRAMUSCULAR | Status: DC | PRN
  Administered 2013-12-25: 10 mL
  Filled 2013-12-25: qty 10

## 2013-12-25 MED ORDER — ONDANSETRON 8 MG/NS 50 ML IVPB
INTRAVENOUS | Status: AC
Start: 2013-12-25 — End: 2013-12-25
  Filled 2013-12-25: qty 8

## 2013-12-25 MED ORDER — DEXAMETHASONE SODIUM PHOSPHATE 10 MG/ML IJ SOLN
INTRAMUSCULAR | Status: AC
  Filled 2013-12-25: qty 1

## 2013-12-25 MED ORDER — HEPARIN SOD (PORK) LOCK FLUSH 100 UNIT/ML IV SOLN
500.0000 [IU] | Freq: Once | INTRAVENOUS | Status: AC | PRN
  Administered 2013-12-25: 500 [IU]
  Filled 2013-12-25: qty 5

## 2013-12-25 MED ORDER — DEXTROSE 5 % IV SOLN
20.0000 mg/m2 | Freq: Once | INTRAVENOUS | Status: AC
  Administered 2013-12-25: 34 mg via INTRAVENOUS
  Filled 2013-12-25: qty 17

## 2013-12-25 MED ORDER — ONDANSETRON 8 MG/50ML IVPB (CHCC)
8.0000 mg | Freq: Once | INTRAVENOUS | Status: AC
  Administered 2013-12-25: 8 mg via INTRAVENOUS

## 2013-12-25 NOTE — Telephone Encounter (Signed)
Talked to pt and gave her appt for lab,md and chemo for February a nd MArch 2015

## 2013-12-25 NOTE — Progress Notes (Signed)
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   01/03/2007 -  Radiation Therapy Approximate date only, received RT for plasmacytoma followed by surgery   09/14/2007 Initial Diagnosis MULTIPLE  MYELOMA   10/05/2007 Bone Marrow Biopsy Bm biopsy was negative   06/18/2008 Bone Marrow Biopsy BM biopsy was negative   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 [facility]   04/13/2011 Relapse/Recurrence Disease relapse   04/14/2011 Bone Marrow Biopsy Bm biopsy showed 2 % plasma cell   04/27/2011 Relapse/Recurrence Disease relapse, treated with Velcade/Cytoxan/Dex   09/07/2011 - 07/28/2013 Chemotherapy She has been receiving Velcade   12/27/2011 Bone Marrow Transplant 2nd transplant at [facility]   07/28/2013 Relapse/Recurrence Chemo is stopped due to progression of disease   08/29/2013 Imaging PEt/CT showed recurrence of disease with new lesion on her rib with compression fracture   09/13/2013 Bone Marrow Biopsy BM biopsy is hypercellular with 6% plasma cell   10/10/2013 - 10/20/2013 Radiation Therapy Started on palliative XRT for rib pain   11/27/2013 -  Chemotherapy The patient starts chemotherapy with Carfilzomib    INTERVAL HISTORY: Kendra Johns 63 y.o. female returns for further followup. Her pain in her chest wall is improving. She denies any recent infection or cough. No new areas of bone pain. She still has not have dental  work taken care of. She is attempting to quit smoking. The patient denies any mouth sores, nausea, vomiting or change in bowel habits  I have reviewed the past medical history, past surgical history, social history and family history with the patient and they are unchanged from previous note.  ALLERGIES:  is allergic to codeine and ibuprofen.  MEDICATIONS:  Current Outpatient Prescriptions  Medication Sig Dispense Refill  . acyclovir (ZOVIRAX) 400 MG tablet Take 1 tablet (400 mg total) by mouth daily.  30 tablet  3  . albuterol (PROVENTIL HFA;VENTOLIN HFA) 108 (90 BASE) MCG/ACT inhaler Inhale 2 puffs into the lungs every 4 (four) hours as needed for wheezing or shortness of breath.  1 Inhaler  0  . amoxicillin (AMOXIL) 500 MG tablet Take 1 tablet (500 mg total) by mouth 2 (two) times daily.  20 tablet  0  . azithromycin (ZITHROMAX) 250 MG tablet 2 tabs on 1st day and 1 tab daily for 4 more days  6 tablet  0  . chlorpheniramine-HYDROcodone (TUSSIONEX PENNKINETIC ER) 10-8 MG/5ML LQCR Take 5 mLs by mouth every 12 (twelve) hours as needed for cough.  115 mL  0  . Cholecalciferol (VITAMIN D-3) 5000 UNITS TABS Take 1 tablet by mouth every morning.      Marland Kitchen dexamethasone (DECADRON) 4 MG tablet Take 8 mg by mouth once a week. On Tuesdays      . diazepam (VALIUM) 5 MG tablet Take 5 mg by mouth every 12 (twelve) hours as needed for anxiety (or sleep).       Marland Kitchen lidocaine-prilocaine (EMLA) cream Apply 1 application topically as needed.  30 g  0  . lisinopril (PRINIVIL,ZESTRIL) 20 MG tablet Take 20 mg by mouth every morning.       . magnesium hydroxide (MILK OF MAGNESIA) 400 MG/5ML suspension Take 30 mLs by mouth daily as needed for mild constipation.      Marland Kitchen morphine (MS CONTIN) 100 MG 12 hr tablet Take 100 mg by mouth 3 (three) times daily.       Marland Kitchen morphine (MS CONTIN) 30 MG 12 hr tablet Take 30 mg by mouth every 12 (twelve) hours.      . prochlorperazine (COMPAZINE) 10 MG tablet Take 10 mg by mouth every 6  (six) hours as needed for nausea.       . [DISCONTINUED] amLODipine (NORVASC) 5 MG tablet Take 1 tablet (5 mg total) by mouth daily.  90 tablet  3  . [DISCONTINUED] gabapentin (NEURONTIN) 600 MG tablet Take 600 mg by mouth 3 (three) times daily.         No current facility-administered medications for this visit.    REVIEW OF SYSTEMS:   Constitutional: Denies fevers, chills or abnormal weight loss Eyes: Denies blurriness of vision Ears, nose, mouth, throat, and face: Denies mucositis or sore throat Respiratory: Denies cough, dyspnea or wheezes Cardiovascular: Denies palpitation, chest discomfort or lower extremity swelling Gastrointestinal:  Denies nausea, heartburn or change in bowel habits Skin: Denies abnormal skin rashes Lymphatics: Denies new lymphadenopathy or easy bruising Neurological:Denies numbness, tingling or new weaknesses Behavioral/Psych: Mood is stable, no new changes  All other systems were reviewed with the patient and are negative.  PHYSICAL EXAMINATION: ECOG PERFORMANCE STATUS: 1 - Symptomatic but completely ambulatory  Filed Vitals:   12/25/13 0941  BP: 154/94  Pulse: 89  Temp: 98 F (36.7 C)  Resp: 20   Filed Weights   12/25/13 0941  Weight: 129 lb 14.4 oz (58.922 kg)    GENERAL:alert, no distress and comfortable SKIN: skin color, texture, turgor are normal, no rashes or significant lesions EYES: normal, Conjunctiva are pink and non-injected, sclera clear OROPHARYNX:no exudate, no erythema and lips, buccal mucosa, and tongue normal . Poor dentition is noted NECK: supple, thyroid normal size, non-tender, without nodularity LYMPH:  no palpable lymphadenopathy in the cervical, axillary or inguinal LUNGS: clear to auscultation and percussion with normal breathing effort HEART: regular rate & rhythm and no murmurs and no lower extremity edema ABDOMEN:abdomen soft, non-tender and normal bowel sounds Musculoskeletal:no cyanosis of digits and no clubbing   NEURO: alert & oriented x 3 with fluent speech, no focal motor/sensory deficits  LABORATORY DATA:  I have reviewed the data as listed    Component Value Date/Time   NA 141 12/25/2013 0909   NA 138 10/06/2013 1920   K 4.0 12/25/2013 0909   K 4.3 10/06/2013 1920   CL 103 10/06/2013 1920   CL 107 03/10/2013 1014   CO2 19* 12/25/2013 0909   CO2 25 10/06/2013 1920   GLUCOSE 137 12/25/2013 0909   GLUCOSE 99 10/06/2013 1920   GLUCOSE 111* 03/10/2013 1014   GLUCOSE 98 08/31/2006 1024   BUN 12.5 12/25/2013 0909   BUN 10 10/06/2013 1920   CREATININE 0.7 12/25/2013 0909   CREATININE 0.63 10/06/2013 1920   CALCIUM 9.3 12/25/2013 0909   CALCIUM 9.8 10/06/2013 1920   PROT 6.6 12/25/2013 0909   PROT 7.2 10/06/2013 1920   ALBUMIN 3.7 12/25/2013 0909   ALBUMIN 3.7 10/06/2013 1920   AST 28 12/25/2013 0909   AST 16 10/06/2013 1920   ALT 21 12/25/2013 0909  ALT 11 10/06/2013 1920   ALKPHOS 201* 12/25/2013 0909   ALKPHOS 100 10/06/2013 1920   BILITOT 0.60 12/25/2013 0909   BILITOT 0.3 10/06/2013 1920   GFRNONAA >90 10/06/2013 1920   GFRAA >90 10/06/2013 1920    No results found for this basename: SPEP,  UPEP,   kappa and lambda light chains    Lab Results  Component Value Date   WBC 5.3 12/25/2013   NEUTROABS 3.2 12/25/2013   HGB 13.9 12/25/2013   HCT 41.8 12/25/2013   MCV 95.8 12/25/2013   PLT 174 12/25/2013      Chemistry      Component Value Date/Time   NA 141 12/25/2013 0909   NA 138 10/06/2013 1920   K 4.0 12/25/2013 0909   K 4.3 10/06/2013 1920   CL 103 10/06/2013 1920   CL 107 03/10/2013 1014   CO2 19* 12/25/2013 0909   CO2 25 10/06/2013 1920   BUN 12.5 12/25/2013 0909   BUN 10 10/06/2013 1920   CREATININE 0.7 12/25/2013 0909   CREATININE 0.63 10/06/2013 1920      Component Value Date/Time   CALCIUM 9.3 12/25/2013 0909   CALCIUM 9.8 10/06/2013 1920   ALKPHOS 201* 12/25/2013 0909   ALKPHOS 100 10/06/2013 1920   AST 28 12/25/2013 0909   AST 16 10/06/2013 1920   ALT 21 12/25/2013 0909   ALT 11 10/06/2013 1920    BILITOT 0.60 12/25/2013 0909   BILITOT 0.3 10/06/2013 1920     ASSESSMENT & PLAN:  #1 multiple myeloma Clinically, since she received radiation therapy, she looked better. However she is still recovering from her recent rib fracture and her performance status is slowly improving Due to poor dentition, Zometa will not be given and until she gets dental clearance. I recommend she continue on pulse dose dexamethasone 8 mg once a week and carfilzomib as scheduled. Before she return next month, I will proceed to order bloodwork to evaluate her response to treatment #2 severe bone pain Her pain is under better control. She will continue followup with her primary doctor for medication adjustments . She'll continue vitamin D supplements #3 poor dentition I recommend she see her dentist after her pain is better controlled and she completes radiation treatment. We will start Zometa only after she obtained dental clearance #4 active smoking I congratulated her effort of attempting to quit smoking. I continue to encourage her. #5 antimicrobial prophylaxis She will continue acyclovir  Orders Placed This Encounter  Procedures  . SPEP & IFE with QIG    Standing Status: Future     Number of Occurrences:      Standing Expiration Date: 12/25/2014  . Kappa/lambda light chains    Standing Status: Future     Number of Occurrences:      Standing Expiration Date: 12/25/2014   All questions were answered. The patient knows to call the clinic with any problems, questions or concerns. No barriers to learning was detected. I spent 25 minutes counseling the patient face to face. The total time spent in the appointment was 40 minutes and more than 50% was on counseling and review of test results     [facility], Nadine, MD 12/25/2013 12:10 PM  ,

## 2013-12-25 NOTE — Telephone Encounter (Signed)
Per staff message and POF I have scheduled appts. Advised scheduler to move lab appts.  JMW  

## 2013-12-25 NOTE — Patient Instructions (Signed)
[facility] Discharge Instructions for Patients Receiving Chemotherapy   To help prevent nausea and vomiting after your treatment, we encourage you to take your nausea medication as prescribed.    If you develop nausea and vomiting that is not controlled by your nausea medication, call the clinic.   BELOW ARE SYMPTOMS THAT SHOULD BE REPORTED IMMEDIATELY:  *FEVER GREATER THAN 100.5 F  *CHILLS WITH OR WITHOUT FEVER  NAUSEA AND VOMITING THAT IS NOT CONTROLLED WITH YOUR NAUSEA MEDICATION  *UNUSUAL SHORTNESS OF BREATH  *UNUSUAL BRUISING OR BLEEDING  TENDERNESS IN MOUTH AND THROAT WITH OR WITHOUT PRESENCE OF ULCERS  *URINARY PROBLEMS  *BOWEL PROBLEMS  UNUSUAL RASH Items with * indicate a potential emergency and should be followed up as soon as possible.  Feel free to call the clinic you have any questions or concerns. The clinic phone number is [phone].

## 2013-12-25 NOTE — Telephone Encounter (Signed)
Gave pt apt for lab,md and chemo for March 2015, emailed michelle regarding chemo

## 2013-12-26 ENCOUNTER — Ambulatory Visit: Payer: Self-pay

## 2013-12-27 ENCOUNTER — Ambulatory Visit (HOSPITAL_BASED_OUTPATIENT_CLINIC_OR_DEPARTMENT_OTHER): Payer: Medicare Other

## 2013-12-27 ENCOUNTER — Telehealth: Payer: Self-pay | Admitting: *Deleted

## 2013-12-27 VITALS — BP 141/95 | HR 80 | Temp 97.5°F | Resp 18

## 2013-12-27 DIAGNOSIS — C9000 Multiple myeloma not having achieved remission: Secondary | ICD-10-CM

## 2013-12-27 DIAGNOSIS — Z5112 Encounter for antineoplastic immunotherapy: Secondary | ICD-10-CM

## 2013-12-27 MED ORDER — ONDANSETRON 8 MG/NS 50 ML IVPB
INTRAVENOUS | Status: AC
  Filled 2013-12-27: qty 8

## 2013-12-27 MED ORDER — ONDANSETRON 8 MG/50ML IVPB (CHCC)
8.0000 mg | Freq: Once | INTRAVENOUS | Status: AC
  Administered 2013-12-27: 8 mg via INTRAVENOUS

## 2013-12-27 MED ORDER — DEXAMETHASONE SODIUM PHOSPHATE 10 MG/ML IJ SOLN
INTRAMUSCULAR | Status: AC
  Filled 2013-12-27: qty 1

## 2013-12-27 MED ORDER — SODIUM CHLORIDE 0.9 % IV SOLN
Freq: Once | INTRAVENOUS | Status: AC
  Administered 2013-12-27: 15:00:00 via INTRAVENOUS

## 2013-12-27 MED ORDER — SODIUM CHLORIDE 0.9 % IV SOLN
Freq: Once | INTRAVENOUS | Status: AC
  Administered 2013-12-27: 16:00:00 via INTRAVENOUS

## 2013-12-27 MED ORDER — SODIUM CHLORIDE 0.9 % IJ SOLN
10.0000 mL | INTRAMUSCULAR | Status: DC | PRN
  Administered 2013-12-27: 10 mL
  Filled 2013-12-27: qty 10

## 2013-12-27 MED ORDER — HEPARIN SOD (PORK) LOCK FLUSH 100 UNIT/ML IV SOLN
500.0000 [IU] | Freq: Once | INTRAVENOUS | Status: AC | PRN
  Administered 2013-12-27: 500 [IU]
  Filled 2013-12-27: qty 5

## 2013-12-27 MED ORDER — DEXAMETHASONE SODIUM PHOSPHATE 10 MG/ML IJ SOLN
10.0000 mg | Freq: Once | INTRAMUSCULAR | Status: AC
  Administered 2013-12-27: 10 mg via INTRAVENOUS

## 2013-12-27 MED ORDER — DEXTROSE 5 % IV SOLN
20.0000 mg/m2 | Freq: Once | INTRAVENOUS | Status: AC
  Administered 2013-12-27: 34 mg via INTRAVENOUS
  Filled 2013-12-27: qty 17

## 2013-12-27 NOTE — Telephone Encounter (Signed)
yes

## 2013-12-27 NOTE — Telephone Encounter (Signed)
Per [facility] RN infusion they can accomodate pt today at 2:45 pm.  Called pt and she states she will try to find a ride to get here and will call us if she is unable to make it.

## 2013-12-27 NOTE — Patient Instructions (Signed)
[facility] Discharge Instructions for Patients Receiving Chemotherapy  Today you received the following chemotherapy agents Kyprolis.  To help prevent nausea and vomiting after your treatment, we encourage you to take your nausea medication.   If you develop nausea and vomiting that is not controlled by your nausea medication, call the clinic.   BELOW ARE SYMPTOMS THAT SHOULD BE REPORTED IMMEDIATELY:  *FEVER GREATER THAN 100.5 F  *CHILLS WITH OR WITHOUT FEVER  NAUSEA AND VOMITING THAT IS NOT CONTROLLED WITH YOUR NAUSEA MEDICATION  *UNUSUAL SHORTNESS OF BREATH  *UNUSUAL BRUISING OR BLEEDING  TENDERNESS IN MOUTH AND THROAT WITH OR WITHOUT PRESENCE OF ULCERS  *URINARY PROBLEMS  *BOWEL PROBLEMS  UNUSUAL RASH Items with * indicate a potential emergency and should be followed up as soon as possible.  Feel free to call the clinic you have any questions or concerns. The clinic phone number is [phone].    

## 2013-12-27 NOTE — Telephone Encounter (Signed)
Pt left VM states she missed her chemo yesterday due to snow.  She also missed call from [place] yesterday.  She asks if she should come in today for her day #2 Kyprolis?

## 2014-01-01 ENCOUNTER — Other Ambulatory Visit (HOSPITAL_BASED_OUTPATIENT_CLINIC_OR_DEPARTMENT_OTHER): Payer: Medicare Other

## 2014-01-01 ENCOUNTER — Ambulatory Visit (HOSPITAL_BASED_OUTPATIENT_CLINIC_OR_DEPARTMENT_OTHER): Payer: Medicare Other

## 2014-01-01 VITALS — BP 153/92 | HR 83 | Temp 97.9°F | Resp 18

## 2014-01-01 DIAGNOSIS — C9000 Multiple myeloma not having achieved remission: Secondary | ICD-10-CM

## 2014-01-01 DIAGNOSIS — E559 Vitamin D deficiency, unspecified: Secondary | ICD-10-CM

## 2014-01-01 DIAGNOSIS — C9002 Multiple myeloma in relapse: Secondary | ICD-10-CM

## 2014-01-01 DIAGNOSIS — Z5112 Encounter for antineoplastic immunotherapy: Secondary | ICD-10-CM

## 2014-01-01 LAB — COMPREHENSIVE METABOLIC PANEL (CC13)
ALT: 27 U/L (ref 0–55)
AST: 34 U/L (ref 5–34)
Albumin: 3.5 g/dL (ref 3.5–5.0)
Alkaline Phosphatase: 151 U/L — ABNORMAL HIGH (ref 40–150)
Anion Gap: 4 mEq/L (ref 3–11)
BILIRUBIN TOTAL: 0.75 mg/dL (ref 0.20–1.20)
BUN: 9.1 mg/dL (ref 7.0–26.0)
CALCIUM: 9.3 mg/dL (ref 8.4–10.4)
CHLORIDE: 113 meq/L — AB (ref 98–109)
CO2: 22 mEq/L (ref 22–29)
Creatinine: 0.6 mg/dL (ref 0.6–1.1)
Glucose: 92 mg/dl (ref 70–140)
Potassium: 4.1 mEq/L (ref 3.5–5.1)
Sodium: 139 mEq/L (ref 136–145)
Total Protein: 6.1 g/dL — ABNORMAL LOW (ref 6.4–8.3)

## 2014-01-01 LAB — CBC WITH DIFFERENTIAL/PLATELET
BASO%: 0.6 % (ref 0.0–2.0)
BASOS ABS: 0 10*3/uL (ref 0.0–0.1)
EOS%: 1.5 % (ref 0.0–7.0)
Eosinophils Absolute: 0.1 10*3/uL (ref 0.0–0.5)
HEMATOCRIT: 41.3 % (ref 34.8–46.6)
HGB: 14.1 g/dL (ref 11.6–15.9)
LYMPH#: 1.4 10*3/uL (ref 0.9–3.3)
LYMPH%: 21.6 % (ref 14.0–49.7)
MCH: 31.8 pg (ref 25.1–34.0)
MCHC: 34.1 g/dL (ref 31.5–36.0)
MCV: 93 fL (ref 79.5–101.0)
MONO#: 0.7 10*3/uL (ref 0.1–0.9)
MONO%: 10 % (ref 0.0–14.0)
NEUT%: 66.3 % (ref 38.4–76.8)
NEUTROS ABS: 4.4 10*3/uL (ref 1.5–6.5)
Platelets: 133 10*3/uL — ABNORMAL LOW (ref 145–400)
RBC: 4.44 10*6/uL (ref 3.70–5.45)
RDW: 15.4 % — AB (ref 11.2–14.5)
WBC: 6.7 10*3/uL (ref 3.9–10.3)
nRBC: 0 % (ref 0–0)

## 2014-01-01 MED ORDER — SODIUM CHLORIDE 0.9 % IV SOLN
Freq: Once | INTRAVENOUS | Status: AC
  Administered 2014-01-01: 15:00:00 via INTRAVENOUS

## 2014-01-01 MED ORDER — DEXAMETHASONE SODIUM PHOSPHATE 10 MG/ML IJ SOLN
INTRAMUSCULAR | Status: AC
  Filled 2014-01-01: qty 1

## 2014-01-01 MED ORDER — DEXTROSE 5 % IV SOLN
20.0000 mg/m2 | Freq: Once | INTRAVENOUS | Status: AC
  Administered 2014-01-01: 34 mg via INTRAVENOUS
  Filled 2014-01-01: qty 17

## 2014-01-01 MED ORDER — ONDANSETRON 8 MG/NS 50 ML IVPB
INTRAVENOUS | Status: AC
  Filled 2014-01-01: qty 8

## 2014-01-01 MED ORDER — DEXAMETHASONE SODIUM PHOSPHATE 10 MG/ML IJ SOLN
10.0000 mg | Freq: Once | INTRAMUSCULAR | Status: AC
  Administered 2014-01-01: 10 mg via INTRAVENOUS

## 2014-01-01 MED ORDER — SODIUM CHLORIDE 0.9 % IJ SOLN
10.0000 mL | INTRAMUSCULAR | Status: DC | PRN
  Administered 2014-01-01: 10 mL
  Filled 2014-01-01: qty 10

## 2014-01-01 MED ORDER — HEPARIN SOD (PORK) LOCK FLUSH 100 UNIT/ML IV SOLN
500.0000 [IU] | Freq: Once | INTRAVENOUS | Status: AC | PRN
  Administered 2014-01-01: 500 [IU]
  Filled 2014-01-01: qty 5

## 2014-01-01 MED ORDER — ONDANSETRON 8 MG/50ML IVPB (CHCC)
8.0000 mg | Freq: Once | INTRAVENOUS | Status: AC
  Administered 2014-01-01: 8 mg via INTRAVENOUS

## 2014-01-01 NOTE — Patient Instructions (Signed)
[facility] Discharge Instructions for Patients Receiving Chemotherapy  Today you received the following chemotherapy agents: Kyprolis  To help prevent nausea and vomiting after your treatment, we encourage you to take your nausea medication as prescribed by your physician.   If you develop nausea and vomiting that is not controlled by your nausea medication, call the clinic.   BELOW ARE SYMPTOMS THAT SHOULD BE REPORTED IMMEDIATELY:  *FEVER GREATER THAN 100.5 F  *CHILLS WITH OR WITHOUT FEVER  NAUSEA AND VOMITING THAT IS NOT CONTROLLED WITH YOUR NAUSEA MEDICATION  *UNUSUAL SHORTNESS OF BREATH  *UNUSUAL BRUISING OR BLEEDING  TENDERNESS IN MOUTH AND THROAT WITH OR WITHOUT PRESENCE OF ULCERS  *URINARY PROBLEMS  *BOWEL PROBLEMS  UNUSUAL RASH Items with * indicate a potential emergency and should be followed up as soon as possible.  Feel free to call the clinic you have any questions or concerns. The clinic phone number is [phone].    

## 2014-01-02 ENCOUNTER — Ambulatory Visit (HOSPITAL_BASED_OUTPATIENT_CLINIC_OR_DEPARTMENT_OTHER): Payer: Medicare Other

## 2014-01-02 ENCOUNTER — Other Ambulatory Visit: Payer: Self-pay | Admitting: Hematology and Oncology

## 2014-01-02 VITALS — BP 153/104 | HR 80 | Temp 98.3°F | Resp 18

## 2014-01-02 DIAGNOSIS — C9002 Multiple myeloma in relapse: Secondary | ICD-10-CM

## 2014-01-02 DIAGNOSIS — C9000 Multiple myeloma not having achieved remission: Secondary | ICD-10-CM

## 2014-01-02 DIAGNOSIS — Z5112 Encounter for antineoplastic immunotherapy: Secondary | ICD-10-CM

## 2014-01-02 MED ORDER — DEXTROSE 5 % IV SOLN: 20.0000 mg/m2 | Freq: Once | INTRAVENOUS | Status: DC

## 2014-01-02 MED ORDER — HEPARIN SOD (PORK) LOCK FLUSH 100 UNIT/ML IV SOLN
500.0000 [IU] | Freq: Once | INTRAVENOUS | Status: AC | PRN
  Administered 2014-01-02: 500 [IU]
  Filled 2014-01-02: qty 5

## 2014-01-02 MED ORDER — ONDANSETRON 8 MG/50ML IVPB (CHCC)
8.0000 mg | Freq: Once | INTRAVENOUS | Status: AC
  Administered 2014-01-02: 8 mg via INTRAVENOUS

## 2014-01-02 MED ORDER — SODIUM CHLORIDE 0.9 % IV SOLN
Freq: Once | INTRAVENOUS | Status: AC
  Administered 2014-01-02: 15:00:00 via INTRAVENOUS

## 2014-01-02 MED ORDER — DEXTROSE 5 % IV SOLN
27.0000 mg/m2 | Freq: Once | INTRAVENOUS | Status: AC
  Administered 2014-01-02: 44 mg via INTRAVENOUS
  Filled 2014-01-02: qty 22

## 2014-01-02 MED ORDER — DEXAMETHASONE SODIUM PHOSPHATE 10 MG/ML IJ SOLN
INTRAMUSCULAR | Status: AC
Start: 2014-01-02 — End: 2014-01-02
  Filled 2014-01-02: qty 1

## 2014-01-02 MED ORDER — SODIUM CHLORIDE 0.9 % IJ SOLN
10.0000 mL | INTRAMUSCULAR | Status: DC | PRN
  Administered 2014-01-02: 10 mL
  Filled 2014-01-02: qty 10

## 2014-01-02 MED ORDER — ONDANSETRON 8 MG/NS 50 ML IVPB
INTRAVENOUS | Status: AC
  Filled 2014-01-02: qty 8

## 2014-01-02 MED ORDER — DEXAMETHASONE SODIUM PHOSPHATE 10 MG/ML IJ SOLN
10.0000 mg | Freq: Once | INTRAMUSCULAR | Status: AC
  Administered 2014-01-02: 10 mg via INTRAVENOUS

## 2014-01-02 NOTE — Patient Instructions (Signed)
[facility] Discharge Instructions for Patients Receiving Chemotherapy  Today you received the following chemotherapy agents Kyprolis To help prevent nausea and vomiting after your treatment, we encourage you to take your nausea medication as prescribed.  If you develop nausea and vomiting that is not controlled by your nausea medication, call the clinic.   BELOW ARE SYMPTOMS THAT SHOULD BE REPORTED IMMEDIATELY:  *FEVER GREATER THAN 100.5 F  *CHILLS WITH OR WITHOUT FEVER  NAUSEA AND VOMITING THAT IS NOT CONTROLLED WITH YOUR NAUSEA MEDICATION  *UNUSUAL SHORTNESS OF BREATH  *UNUSUAL BRUISING OR BLEEDING  TENDERNESS IN MOUTH AND THROAT WITH OR WITHOUT PRESENCE OF ULCERS  *URINARY PROBLEMS  *BOWEL PROBLEMS  UNUSUAL RASH Items with * indicate a potential emergency and should be followed up as soon as possible.  Feel free to call the clinic you have any questions or concerns. The clinic phone number is [phone].    

## 2014-01-02 NOTE — Progress Notes (Signed)
Patient states she has not taken her Blood pressure medication in a couple of days due to just forgetting to take it.

## 2014-01-08 ENCOUNTER — Other Ambulatory Visit (HOSPITAL_BASED_OUTPATIENT_CLINIC_OR_DEPARTMENT_OTHER): Payer: Medicare Other

## 2014-01-08 ENCOUNTER — Ambulatory Visit (HOSPITAL_BASED_OUTPATIENT_CLINIC_OR_DEPARTMENT_OTHER): Payer: Medicare Other

## 2014-01-08 DIAGNOSIS — Z5112 Encounter for antineoplastic immunotherapy: Secondary | ICD-10-CM

## 2014-01-08 DIAGNOSIS — C9000 Multiple myeloma not having achieved remission: Secondary | ICD-10-CM

## 2014-01-08 DIAGNOSIS — E559 Vitamin D deficiency, unspecified: Secondary | ICD-10-CM

## 2014-01-08 LAB — CBC WITH DIFFERENTIAL/PLATELET
BASO%: 0.1 % (ref 0.0–2.0)
BASOS ABS: 0 10*3/uL (ref 0.0–0.1)
EOS%: 2 % (ref 0.0–7.0)
Eosinophils Absolute: 0.2 10*3/uL (ref 0.0–0.5)
HEMATOCRIT: 40.2 % (ref 34.8–46.6)
HGB: 13.4 g/dL (ref 11.6–15.9)
LYMPH%: 18.3 % (ref 14.0–49.7)
MCH: 31.8 pg (ref 25.1–34.0)
MCHC: 33.3 g/dL (ref 31.5–36.0)
MCV: 95.3 fL (ref 79.5–101.0)
MONO#: 0.8 10*3/uL (ref 0.1–0.9)
MONO%: 9.9 % (ref 0.0–14.0)
NEUT#: 5.8 10*3/uL (ref 1.5–6.5)
NEUT%: 69.7 % (ref 38.4–76.8)
Platelets: 114 10*3/uL — ABNORMAL LOW (ref 145–400)
RBC: 4.22 10*6/uL (ref 3.70–5.45)
RDW: 15.9 % — AB (ref 11.2–14.5)
WBC: 8.3 10*3/uL (ref 3.9–10.3)
lymph#: 1.5 10*3/uL (ref 0.9–3.3)

## 2014-01-08 LAB — COMPREHENSIVE METABOLIC PANEL (CC13)
ALT: 26 U/L (ref 0–55)
AST: 26 U/L (ref 5–34)
Albumin: 3.6 g/dL (ref 3.5–5.0)
Alkaline Phosphatase: 145 U/L (ref 40–150)
Anion Gap: 9 mEq/L (ref 3–11)
BUN: 12.8 mg/dL (ref 7.0–26.0)
CALCIUM: 9.5 mg/dL (ref 8.4–10.4)
CHLORIDE: 107 meq/L (ref 98–109)
CO2: 25 mEq/L (ref 22–29)
CREATININE: 0.8 mg/dL (ref 0.6–1.1)
Glucose: 123 mg/dl (ref 70–140)
Potassium: 4.1 mEq/L (ref 3.5–5.1)
SODIUM: 141 meq/L (ref 136–145)
Total Bilirubin: 0.56 mg/dL (ref 0.20–1.20)
Total Protein: 6.5 g/dL (ref 6.4–8.3)

## 2014-01-08 MED ORDER — DEXAMETHASONE SODIUM PHOSPHATE 10 MG/ML IJ SOLN
10.0000 mg | Freq: Once | INTRAMUSCULAR | Status: AC
  Administered 2014-01-08: 10 mg via INTRAVENOUS

## 2014-01-08 MED ORDER — ONDANSETRON 8 MG/50ML IVPB (CHCC)
8.0000 mg | Freq: Once | INTRAVENOUS | Status: AC
  Administered 2014-01-08: 8 mg via INTRAVENOUS

## 2014-01-08 MED ORDER — DEXAMETHASONE SODIUM PHOSPHATE 10 MG/ML IJ SOLN
INTRAMUSCULAR | Status: AC
  Filled 2014-01-08: qty 1

## 2014-01-08 MED ORDER — ONDANSETRON 8 MG/NS 50 ML IVPB
INTRAVENOUS | Status: AC
  Filled 2014-01-08: qty 8

## 2014-01-08 MED ORDER — DEXTROSE 5 % IV SOLN
27.0000 mg/m2 | Freq: Once | INTRAVENOUS | Status: AC
  Administered 2014-01-08: 44 mg via INTRAVENOUS
  Filled 2014-01-08: qty 22

## 2014-01-08 MED ORDER — HEPARIN SOD (PORK) LOCK FLUSH 100 UNIT/ML IV SOLN
500.0000 [IU] | Freq: Once | INTRAVENOUS | Status: AC | PRN
  Administered 2014-01-08: 500 [IU]
  Filled 2014-01-08: qty 5

## 2014-01-08 MED ORDER — SODIUM CHLORIDE 0.9 % IV SOLN
Freq: Once | INTRAVENOUS | Status: AC
  Administered 2014-01-08: 13:00:00 via INTRAVENOUS

## 2014-01-08 MED ORDER — SODIUM CHLORIDE 0.9 % IJ SOLN
10.0000 mL | INTRAMUSCULAR | Status: DC | PRN
  Administered 2014-01-08: 10 mL
  Filled 2014-01-08: qty 10

## 2014-01-08 NOTE — Patient Instructions (Signed)
[facility] Discharge Instructions for Patients Receiving Chemotherapy  Today you received the following chemotherapy agents: Kyprolis  To help prevent nausea and vomiting after your treatment, we encourage you to take your nausea medication, Compazine. Take one every 6 hours as needed.   If you develop nausea and vomiting that is not controlled by your nausea medication, call the clinic.   BELOW ARE SYMPTOMS THAT SHOULD BE REPORTED IMMEDIATELY:  *FEVER GREATER THAN 100.5 F  *CHILLS WITH OR WITHOUT FEVER  NAUSEA AND VOMITING THAT IS NOT CONTROLLED WITH YOUR NAUSEA MEDICATION  *UNUSUAL SHORTNESS OF BREATH  *UNUSUAL BRUISING OR BLEEDING  TENDERNESS IN MOUTH AND THROAT WITH OR WITHOUT PRESENCE OF ULCERS  *URINARY PROBLEMS  *BOWEL PROBLEMS  UNUSUAL RASH Items with * indicate a potential emergency and should be followed up as soon as possible.  Feel free to call the clinic should you have any questions or concerns. The clinic phone number is [phone].

## 2014-01-09 ENCOUNTER — Ambulatory Visit (HOSPITAL_BASED_OUTPATIENT_CLINIC_OR_DEPARTMENT_OTHER): Payer: Medicare Other

## 2014-01-09 VITALS — BP 147/99 | HR 83 | Temp 98.2°F | Resp 18

## 2014-01-09 DIAGNOSIS — C9000 Multiple myeloma not having achieved remission: Secondary | ICD-10-CM

## 2014-01-09 DIAGNOSIS — Z5112 Encounter for antineoplastic immunotherapy: Secondary | ICD-10-CM

## 2014-01-09 MED ORDER — SODIUM CHLORIDE 0.9 % IV SOLN
Freq: Once | INTRAVENOUS | Status: AC
  Administered 2014-01-09: 13:00:00 via INTRAVENOUS

## 2014-01-09 MED ORDER — ONDANSETRON 8 MG/50ML IVPB (CHCC)
8.0000 mg | Freq: Once | INTRAVENOUS | Status: AC
  Administered 2014-01-09: 8 mg via INTRAVENOUS

## 2014-01-09 MED ORDER — DEXAMETHASONE SODIUM PHOSPHATE 10 MG/ML IJ SOLN
10.0000 mg | Freq: Once | INTRAMUSCULAR | Status: AC
  Administered 2014-01-09: 10 mg via INTRAVENOUS

## 2014-01-09 MED ORDER — HEPARIN SOD (PORK) LOCK FLUSH 100 UNIT/ML IV SOLN
500.0000 [IU] | Freq: Once | INTRAVENOUS | Status: AC | PRN
  Administered 2014-01-09: 500 [IU]
  Filled 2014-01-09: qty 5

## 2014-01-09 MED ORDER — SODIUM CHLORIDE 0.9 % IJ SOLN
10.0000 mL | INTRAMUSCULAR | Status: DC | PRN
  Administered 2014-01-09: 10 mL
  Filled 2014-01-09: qty 10

## 2014-01-09 MED ORDER — ONDANSETRON 8 MG/NS 50 ML IVPB
INTRAVENOUS | Status: AC
Start: 2014-01-09 — End: 2014-01-09
  Filled 2014-01-09: qty 8

## 2014-01-09 MED ORDER — DEXTROSE 5 % IV SOLN
27.0000 mg/m2 | Freq: Once | INTRAVENOUS | Status: AC
  Administered 2014-01-09: 44 mg via INTRAVENOUS
  Filled 2014-01-09: qty 22

## 2014-01-09 NOTE — Progress Notes (Signed)
Patient's blood pressure elevated 167/109 in left arm sitting after entering infusion room.  Patient states she forgot to take her blood pressure medication this am and she does not have it with her. Rechecked blood pressure in left arm after 10 minutes.  Blood pressure down to 147/99

## 2014-01-09 NOTE — Patient Instructions (Signed)
[facility] Discharge Instructions for Patients Receiving Chemotherapy  Today you received the following chemotherapy agent: Kyprolis  To help prevent nausea and vomiting after your treatment, we encourage you to take your nausea medication as prescribed.    If you develop nausea and vomiting that is not controlled by your nausea medication, call the clinic.   BELOW ARE SYMPTOMS THAT SHOULD BE REPORTED IMMEDIATELY:  *FEVER GREATER THAN 100.5 F  *CHILLS WITH OR WITHOUT FEVER  NAUSEA AND VOMITING THAT IS NOT CONTROLLED WITH YOUR NAUSEA MEDICATION  *UNUSUAL SHORTNESS OF BREATH  *UNUSUAL BRUISING OR BLEEDING  TENDERNESS IN MOUTH AND THROAT WITH OR WITHOUT PRESENCE OF ULCERS  *URINARY PROBLEMS  *BOWEL PROBLEMS  UNUSUAL RASH Items with * indicate a potential emergency and should be followed up as soon as possible.  Feel free to call the clinic you have any questions or concerns. The clinic phone number is [phone].    

## 2014-01-09 NOTE — Progress Notes (Signed)
Per Dr. Alvy Bimler, omit 250 cc pre hydration due to high BP.

## 2014-01-10 LAB — KAPPA/LAMBDA LIGHT CHAINS
KAPPA LAMBDA RATIO: 20.9 — AB (ref 0.26–1.65)
Kappa free light chain: 14 mg/dL — ABNORMAL HIGH (ref 0.33–1.94)
LAMBDA FREE LGHT CHN: 0.67 mg/dL (ref 0.57–2.63)

## 2014-01-10 LAB — SPEP & IFE WITH QIG
Albumin ELP: 58.7 % (ref 55.8–66.1)
Alpha-1-Globulin: 5.9 % — ABNORMAL HIGH (ref 2.9–4.9)
Alpha-2-Globulin: 13.3 % — ABNORMAL HIGH (ref 7.1–11.8)
Beta 2: 3.6 % (ref 3.2–6.5)
Beta Globulin: 7.2 % (ref 4.7–7.2)
Gamma Globulin: 11.3 % (ref 11.1–18.8)
IGG (IMMUNOGLOBIN G), SERUM: 670 mg/dL — AB (ref 690–1700)
IgA: 29 mg/dL — ABNORMAL LOW (ref 69–380)
IgM, Serum: 55 mg/dL (ref 52–322)
TOTAL PROTEIN, SERUM ELECTROPHOR: 6 g/dL (ref 6.0–8.3)

## 2014-01-22 ENCOUNTER — Encounter: Payer: Self-pay | Admitting: Hematology and Oncology

## 2014-01-22 ENCOUNTER — Ambulatory Visit (HOSPITAL_BASED_OUTPATIENT_CLINIC_OR_DEPARTMENT_OTHER): Payer: Medicare Other

## 2014-01-22 ENCOUNTER — Ambulatory Visit (HOSPITAL_BASED_OUTPATIENT_CLINIC_OR_DEPARTMENT_OTHER): Payer: Medicare Other | Admitting: Hematology and Oncology

## 2014-01-22 ENCOUNTER — Telehealth: Payer: Self-pay | Admitting: Hematology and Oncology

## 2014-01-22 ENCOUNTER — Other Ambulatory Visit (HOSPITAL_BASED_OUTPATIENT_CLINIC_OR_DEPARTMENT_OTHER): Payer: Medicare Other

## 2014-01-22 VITALS — BP 161/92 | HR 79 | Temp 98.2°F | Resp 18 | Ht 64.0 in | Wt 131.4 lb

## 2014-01-22 DIAGNOSIS — E559 Vitamin D deficiency, unspecified: Secondary | ICD-10-CM

## 2014-01-22 DIAGNOSIS — F17200 Nicotine dependence, unspecified, uncomplicated: Secondary | ICD-10-CM

## 2014-01-22 DIAGNOSIS — R110 Nausea: Secondary | ICD-10-CM

## 2014-01-22 DIAGNOSIS — C9000 Multiple myeloma not having achieved remission: Secondary | ICD-10-CM

## 2014-01-22 DIAGNOSIS — Z5112 Encounter for antineoplastic immunotherapy: Secondary | ICD-10-CM

## 2014-01-22 HISTORY — DX: Nausea: R11.0

## 2014-01-22 LAB — COMPREHENSIVE METABOLIC PANEL (CC13)
ALBUMIN: 3.8 g/dL (ref 3.5–5.0)
ALK PHOS: 139 U/L (ref 40–150)
ALT: 25 U/L (ref 0–55)
ANION GAP: 7 meq/L (ref 3–11)
AST: 28 U/L (ref 5–34)
BUN: 13.8 mg/dL (ref 7.0–26.0)
CALCIUM: 9.4 mg/dL (ref 8.4–10.4)
CHLORIDE: 108 meq/L (ref 98–109)
CO2: 21 meq/L — AB (ref 22–29)
Creatinine: 0.7 mg/dL (ref 0.6–1.1)
Glucose: 86 mg/dl (ref 70–140)
Potassium: 4.2 mEq/L (ref 3.5–5.1)
SODIUM: 136 meq/L (ref 136–145)
TOTAL PROTEIN: 6.8 g/dL (ref 6.4–8.3)
Total Bilirubin: 0.57 mg/dL (ref 0.20–1.20)

## 2014-01-22 LAB — CBC WITH DIFFERENTIAL/PLATELET
BASO%: 0.6 % (ref 0.0–2.0)
Basophils Absolute: 0 10*3/uL (ref 0.0–0.1)
EOS%: 2.5 % (ref 0.0–7.0)
Eosinophils Absolute: 0.2 10*3/uL (ref 0.0–0.5)
HCT: 44.3 % (ref 34.8–46.6)
HGB: 14.9 g/dL (ref 11.6–15.9)
LYMPH%: 18.6 % (ref 14.0–49.7)
MCH: 32.2 pg (ref 25.1–34.0)
MCHC: 33.6 g/dL (ref 31.5–36.0)
MCV: 95.7 fL (ref 79.5–101.0)
MONO#: 0.6 10*3/uL (ref 0.1–0.9)
MONO%: 9.1 % (ref 0.0–14.0)
NEUT%: 69.2 % (ref 38.4–76.8)
NEUTROS ABS: 4.5 10*3/uL (ref 1.5–6.5)
NRBC: 0 % (ref 0–0)
PLATELETS: 163 10*3/uL (ref 145–400)
RBC: 4.63 10*6/uL (ref 3.70–5.45)
RDW: 15.3 % — AB (ref 11.2–14.5)
WBC: 6.5 10*3/uL (ref 3.9–10.3)
lymph#: 1.2 10*3/uL (ref 0.9–3.3)

## 2014-01-22 MED ORDER — DEXAMETHASONE SODIUM PHOSPHATE 10 MG/ML IJ SOLN
10.0000 mg | Freq: Once | INTRAMUSCULAR | Status: AC
  Administered 2014-01-22: 10 mg via INTRAVENOUS

## 2014-01-22 MED ORDER — HEPARIN SOD (PORK) LOCK FLUSH 100 UNIT/ML IV SOLN
500.0000 [IU] | Freq: Once | INTRAVENOUS | Status: AC | PRN
  Administered 2014-01-22: 500 [IU]
  Filled 2014-01-22: qty 5

## 2014-01-22 MED ORDER — PROCHLORPERAZINE MALEATE 10 MG PO TABS: 10.0000 mg | ORAL_TABLET | Freq: Four times a day (QID) | ORAL | Status: DC | PRN

## 2014-01-22 MED ORDER — LIDOCAINE-PRILOCAINE 2.5-2.5 % EX CREA: 1.0000 "application " | TOPICAL_CREAM | CUTANEOUS | Status: DC | PRN

## 2014-01-22 MED ORDER — SODIUM CHLORIDE 0.9 % IV SOLN
Freq: Once | INTRAVENOUS | Status: AC
  Administered 2014-01-22: 11:00:00 via INTRAVENOUS

## 2014-01-22 MED ORDER — ONDANSETRON 8 MG/NS 50 ML IVPB
INTRAVENOUS | Status: AC
  Filled 2014-01-22: qty 8

## 2014-01-22 MED ORDER — SODIUM CHLORIDE 0.9 % IJ SOLN
10.0000 mL | INTRAMUSCULAR | Status: DC | PRN
  Administered 2014-01-22: 10 mL
  Filled 2014-01-22: qty 10

## 2014-01-22 MED ORDER — DEXAMETHASONE SODIUM PHOSPHATE 10 MG/ML IJ SOLN
INTRAMUSCULAR | Status: AC
  Filled 2014-01-22: qty 1

## 2014-01-22 MED ORDER — ONDANSETRON 8 MG/50ML IVPB (CHCC)
8.0000 mg | Freq: Once | INTRAVENOUS | Status: AC
  Administered 2014-01-22: 8 mg via INTRAVENOUS

## 2014-01-22 MED ORDER — CARFILZOMIB CHEMO INJECTION 60 MG
27.0000 mg/m2 | Freq: Once | INTRAVENOUS | Status: AC
  Administered 2014-01-22: 44 mg via INTRAVENOUS
  Filled 2014-01-22: qty 22

## 2014-01-22 NOTE — Progress Notes (Signed)
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   01/03/2007 -  Radiation Therapy Approximate date only, received RT for plasmacytoma followed by surgery   09/14/2007 Initial Diagnosis MULTIPLE  MYELOMA   10/05/2007 Bone Marrow Biopsy Bm biopsy was negative   06/18/2008 Bone Marrow Biopsy BM biopsy was negative   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 [facility]   04/13/2011 Relapse/Recurrence Disease relapse   04/14/2011 Bone Marrow Biopsy Bm biopsy showed 2 % plasma cell   04/27/2011 Relapse/Recurrence Disease relapse, treated with Velcade/Cytoxan/Dex   09/07/2011 - 07/28/2013 Chemotherapy She has been receiving Velcade   12/27/2011 Bone Marrow Transplant 2nd transplant at [facility]   07/28/2013 Relapse/Recurrence Chemo is stopped due to progression of disease   08/29/2013 Imaging PEt/CT showed recurrence of disease with new lesion on her rib with compression fracture   09/13/2013 Bone Marrow Biopsy BM biopsy is hypercellular with 6% plasma cell   10/10/2013 - 10/20/2013 Radiation Therapy Started on palliative XRT for rib pain   11/27/2013 -  Chemotherapy The patient starts chemotherapy with Carfilzomib    INTERVAL HISTORY: Kendra Johns 63 y.o. female returns for further followup.  She complained of fatigue.  Her bone pain has resolved. She is on a taper course of pain medicine. She is getting dental  evaluation. She is actively trying to quit smoking. The patient denies any mouth sores, nausea, vomiting or change in bowel habits I have reviewed the past medical history, past surgical history, social history and family history with the patient and they are unchanged from previous note.  ALLERGIES:  is allergic to codeine and ibuprofen.  MEDICATIONS:  Current Outpatient Prescriptions  Medication Sig Dispense Refill  . acyclovir (ZOVIRAX) 400 MG tablet Take 1 tablet (400 mg total) by mouth daily.  30 tablet  3  . albuterol (PROVENTIL HFA;VENTOLIN HFA) 108 (90 BASE) MCG/ACT inhaler Inhale 2 puffs into the lungs every 4 (four) hours as needed for wheezing or shortness of breath.  1 Inhaler  0  . Cholecalciferol (VITAMIN D-3) 5000 UNITS TABS Take 1 tablet by mouth every morning.      Marland Kitchen dexamethasone (DECADRON) 4 MG tablet Take 8 mg by mouth once a week. On Tuesdays      . diazepam (VALIUM) 5 MG tablet Take 5 mg by mouth every 12 (twelve) hours as needed for anxiety (or sleep).       Marland Kitchen lidocaine-prilocaine (EMLA) cream Apply 1 application topically as needed.  30 g  0  . lisinopril (PRINIVIL,ZESTRIL) 20 MG tablet Take 20 mg by mouth every morning.       . magnesium hydroxide (MILK OF MAGNESIA) 400 MG/5ML suspension Take 30 mLs by mouth daily as needed for mild constipation.      Marland Kitchen morphine (MS CONTIN) 100 MG 12 hr tablet Take 100 mg by mouth 3 (three) times daily.       Marland Kitchen morphine (MS CONTIN) 30 MG 12 hr tablet Take  30 mg by mouth every 12 (twelve) hours.      . prochlorperazine (COMPAZINE) 10 MG tablet Take 1 tablet (10 mg total) by mouth every 6 (six) hours as needed for nausea.  60 tablet  3  . chlorpheniramine-HYDROcodone (TUSSIONEX PENNKINETIC ER) 10-8 MG/5ML LQCR Take 5 mLs by mouth every 12 (twelve) hours as needed for cough.  115 mL  0  . lidocaine-prilocaine (EMLA) cream Apply 1 application topically as needed.  30 g  3  . [DISCONTINUED] amLODipine (NORVASC) 5 MG tablet Take 1 tablet (5  mg total) by mouth daily.  90 tablet  3  . [DISCONTINUED] gabapentin (NEURONTIN) 600 MG tablet Take 600 mg by mouth 3 (three) times daily.         No current facility-administered medications for this visit.   Facility-Administered Medications Ordered in Other Visits  Medication Dose Route Frequency Provider Last Rate Last Dose  . carfilzomib (KYPROLIS) 44 mg in dextrose 5 % 100 mL chemo infusion  27 mg/m2 (Treatment Plan Actual) Intravenous Once Heath Lark, MD      . heparin lock flush 100 unit/mL  500 Units Intracatheter Once PRN Heath Lark, MD      . ondansetron (ZOFRAN) IVPB 8 mg  8 mg Intravenous Once Heath Lark, MD   8 mg at 01/22/14 1100  . sodium chloride 0.9 % injection 10 mL  10 mL Intracatheter PRN Heath Lark, MD        REVIEW OF SYSTEMS:   Constitutional: Denies fevers, chills or abnormal weight loss Eyes: Denies blurriness of vision Ears, nose, mouth, throat, and face: Denies mucositis or sore throat Respiratory: Denies cough, dyspnea or wheezes Cardiovascular: Denies palpitation, chest discomfort or lower extremity swelling Gastrointestinal:  Denies nausea, heartburn or change in bowel habits Skin: Denies abnormal skin rashes Lymphatics: Denies new lymphadenopathy or easy bruising Neurological:Denies numbness, tingling or new weaknesses Behavioral/Psych: Mood is stable, no new changes  All other systems were reviewed with the patient and are negative.  PHYSICAL EXAMINATION: ECOG PERFORMANCE STATUS: 1 - Symptomatic but completely ambulatory  Filed Vitals:   01/22/14 0937  BP: 161/92  Pulse: 79  Temp: 98.2 F (36.8 C)  Resp: 18   Filed Weights   01/22/14 0937  Weight: 131 lb 6.4 oz (59.603 kg)    GENERAL:alert, no distress and comfortable SKIN: skin color, texture, turgor are normal, no rashes or significant lesions EYES: normal, Conjunctiva are pink and non-injected, sclera clear OROPHARYNX:no exudate, no erythema and lips, buccal mucosa, and tongue normal .  Poor dentition is noted NECK: supple, thyroid normal size, non-tender, without nodularity LYMPH:  no palpable lymphadenopathy in the cervical, axillary or inguinal LUNGS: clear to auscultation and percussion with normal breathing effort HEART: regular rate & rhythm and no murmurs and no lower extremity edema ABDOMEN:abdomen soft, non-tender and normal bowel sounds Musculoskeletal:no cyanosis of digits and no clubbing  NEURO: alert & oriented x 3 with fluent speech, no focal motor/sensory deficits  LABORATORY DATA:  I have reviewed the data as listed    Component Value Date/Time   NA 141 01/08/2014 0850   NA 138 10/06/2013 1920   K 4.1 01/08/2014 0850   K 4.3 10/06/2013 1920   CL 103 10/06/2013 1920   CL 107 03/10/2013 1014   CO2 25 01/08/2014 0850   CO2 25 10/06/2013 1920   GLUCOSE 123 01/08/2014 0850   GLUCOSE 99 10/06/2013 1920   GLUCOSE 111* 03/10/2013 1014   GLUCOSE 98 08/31/2006 1024   BUN  12.8 01/08/2014 0850   BUN 10 10/06/2013 1920   CREATININE 0.8 01/08/2014 0850   CREATININE 0.63 10/06/2013 1920   CALCIUM 9.5 01/08/2014 0850   CALCIUM 9.8 10/06/2013 1920   PROT 6.5 01/08/2014 0850   PROT 7.2 10/06/2013 1920   ALBUMIN 3.6 01/08/2014 0850   ALBUMIN 3.7 10/06/2013 1920   AST 26 01/08/2014 0850   AST 16 10/06/2013 1920   ALT 26 01/08/2014 0850   ALT 11 10/06/2013 1920   ALKPHOS 145 01/08/2014 0850   ALKPHOS 100 10/06/2013 1920   BILITOT 0.56 01/08/2014 0850   BILITOT 0.3 10/06/2013 1920   GFRNONAA >90 10/06/2013 1920   GFRAA >90 10/06/2013 1920    No results found for this basename: SPEP,  UPEP,   kappa and lambda light chains    Lab Results  Component Value Date   WBC 6.5 01/22/2014   NEUTROABS 4.5 01/22/2014   HGB 14.9 01/22/2014   HCT 44.3 01/22/2014   MCV 95.7 01/22/2014   PLT 163 01/22/2014      Chemistry      Component Value Date/Time   NA 141 01/08/2014 0850   NA 138 10/06/2013 1920   K 4.1 01/08/2014 0850   K 4.3 10/06/2013 1920   CL 103 10/06/2013 1920   CL 107 03/10/2013 1014   CO2 25  01/08/2014 0850   CO2 25 10/06/2013 1920   BUN 12.8 01/08/2014 0850   BUN 10 10/06/2013 1920   CREATININE 0.8 01/08/2014 0850   CREATININE 0.63 10/06/2013 1920      Component Value Date/Time   CALCIUM 9.5 01/08/2014 0850   CALCIUM 9.8 10/06/2013 1920   ALKPHOS 145 01/08/2014 0850   ALKPHOS 100 10/06/2013 1920   AST 26 01/08/2014 0850   AST 16 10/06/2013 1920   ALT 26 01/08/2014 0850   ALT 11 10/06/2013 1920   BILITOT 0.56 01/08/2014 0850   BILITOT 0.3 10/06/2013 1920      ASSESSMENT & PLAN:  #1 multiple myeloma Clinically, since she received radiation therapy, she looked better. However she is still recovering from her recent rib fracture and her performance status is slowly improving Due to poor dentition, Zometa will not be given and until she gets dental clearance. I recommend she discontinue pulse dose dexamethasone. She will continue on Carfilzomib. Her light chains corresponding to recent treatment. #2 bone pain, resolved Her pain is under better control. She will continue followup with her primary doctor for medication adjustments . She'll continue vitamin D supplements #3 poor dentition I recommend she see her dentist after her pain is better controlled and she completes radiation treatment. We will start Zometa only after she obtained dental clearance #4 active smoking I congratulated her effort of attempting to quit smoking. I continue to encourage her. #5 antimicrobial prophylaxis She will continue acyclovir  All questions were answered. The patient knows to call the clinic with any problems, questions or concerns. No barriers to learning was detected. I spent 25 minutes counseling the patient face to face. The total time spent in the appointment was 40 minutes and more than 50% was on counseling and review of test results     [facility], Gerren Hoffmeier, MD 01/22/2014 11:13 AM

## 2014-01-22 NOTE — Patient Instructions (Signed)
[facility] Discharge Instructions for Patients Receiving Chemotherapy  Today you received the following chemotherapy agents Kyprolis To help prevent nausea and vomiting after your treatment, we encourage you to take your nausea medication as prescribed.  If you develop nausea and vomiting that is not controlled by your nausea medication, call the clinic.   BELOW ARE SYMPTOMS THAT SHOULD BE REPORTED IMMEDIATELY:  *FEVER GREATER THAN 100.5 F  *CHILLS WITH OR WITHOUT FEVER  NAUSEA AND VOMITING THAT IS NOT CONTROLLED WITH YOUR NAUSEA MEDICATION  *UNUSUAL SHORTNESS OF BREATH  *UNUSUAL BRUISING OR BLEEDING  TENDERNESS IN MOUTH AND THROAT WITH OR WITHOUT PRESENCE OF ULCERS  *URINARY PROBLEMS  *BOWEL PROBLEMS  UNUSUAL RASH Items with * indicate a potential emergency and should be followed up as soon as possible.  Feel free to call the clinic you have any questions or concerns. The clinic phone number is [phone].    

## 2014-01-22 NOTE — Telephone Encounter (Signed)
gv adn printed aptp sched and avs forpt for March thru May....Marland Kitchensed added tx.

## 2014-01-23 ENCOUNTER — Ambulatory Visit (HOSPITAL_BASED_OUTPATIENT_CLINIC_OR_DEPARTMENT_OTHER): Payer: Medicare Other

## 2014-01-23 DIAGNOSIS — C9000 Multiple myeloma not having achieved remission: Secondary | ICD-10-CM

## 2014-01-23 DIAGNOSIS — Z5112 Encounter for antineoplastic immunotherapy: Secondary | ICD-10-CM

## 2014-01-23 MED ORDER — SODIUM CHLORIDE 0.9 % IV SOLN: Freq: Once | INTRAVENOUS | Status: DC

## 2014-01-23 MED ORDER — DEXAMETHASONE SODIUM PHOSPHATE 10 MG/ML IJ SOLN
10.0000 mg | Freq: Once | INTRAMUSCULAR | Status: AC
  Administered 2014-01-23: 10 mg via INTRAVENOUS

## 2014-01-23 MED ORDER — HEPARIN SOD (PORK) LOCK FLUSH 100 UNIT/ML IV SOLN
500.0000 [IU] | Freq: Once | INTRAVENOUS | Status: AC | PRN
Start: 2014-01-23 — End: 2014-01-23
  Administered 2014-01-23: 500 [IU]
  Filled 2014-01-23: qty 5

## 2014-01-23 MED ORDER — ONDANSETRON 8 MG/NS 50 ML IVPB
INTRAVENOUS | Status: AC
  Filled 2014-01-23: qty 8

## 2014-01-23 MED ORDER — DEXTROSE 5 % IV SOLN
27.0000 mg/m2 | Freq: Once | INTRAVENOUS | Status: AC
  Administered 2014-01-23: 44 mg via INTRAVENOUS
  Filled 2014-01-23: qty 22

## 2014-01-23 MED ORDER — DEXAMETHASONE SODIUM PHOSPHATE 10 MG/ML IJ SOLN
INTRAMUSCULAR | Status: AC
  Filled 2014-01-23: qty 1

## 2014-01-23 MED ORDER — ONDANSETRON 8 MG/50ML IVPB (CHCC)
8.0000 mg | Freq: Once | INTRAVENOUS | Status: AC
  Administered 2014-01-23: 8 mg via INTRAVENOUS

## 2014-01-23 MED ORDER — SODIUM CHLORIDE 0.9 % IV SOLN
Freq: Once | INTRAVENOUS | Status: AC
  Administered 2014-01-23: 12:00:00 via INTRAVENOUS

## 2014-01-23 MED ORDER — SODIUM CHLORIDE 0.9 % IJ SOLN
10.0000 mL | INTRAMUSCULAR | Status: DC | PRN
  Administered 2014-01-23: 10 mL
  Filled 2014-01-23: qty 10

## 2014-01-23 NOTE — Patient Instructions (Signed)
[facility] Discharge Instructions for Patients Receiving Chemotherapy  Today you received the following chemotherapy agent Kyprolis.  To help prevent nausea and vomiting after your treatment, we encourage you to take your nausea medication.   If you develop nausea and vomiting that is not controlled by your nausea medication, call the clinic.   BELOW ARE SYMPTOMS THAT SHOULD BE REPORTED IMMEDIATELY:  *FEVER GREATER THAN 100.5 F  *CHILLS WITH OR WITHOUT FEVER  NAUSEA AND VOMITING THAT IS NOT CONTROLLED WITH YOUR NAUSEA MEDICATION  *UNUSUAL SHORTNESS OF BREATH  *UNUSUAL BRUISING OR BLEEDING  TENDERNESS IN MOUTH AND THROAT WITH OR WITHOUT PRESENCE OF ULCERS  *URINARY PROBLEMS  *BOWEL PROBLEMS  UNUSUAL RASH Items with * indicate a potential emergency and should be followed up as soon as possible.  Feel free to call the clinic you have any questions or concerns. The clinic phone number is [phone].    

## 2014-01-27 ENCOUNTER — Other Ambulatory Visit: Payer: Self-pay | Admitting: Hematology and Oncology

## 2014-01-27 DIAGNOSIS — C9000 Multiple myeloma not having achieved remission: Secondary | ICD-10-CM

## 2014-01-29 ENCOUNTER — Ambulatory Visit (HOSPITAL_BASED_OUTPATIENT_CLINIC_OR_DEPARTMENT_OTHER): Payer: Medicare Other

## 2014-01-29 ENCOUNTER — Other Ambulatory Visit (HOSPITAL_BASED_OUTPATIENT_CLINIC_OR_DEPARTMENT_OTHER): Payer: Medicare Other

## 2014-01-29 VITALS — BP 167/114 | HR 72 | Temp 98.0°F | Resp 20

## 2014-01-29 DIAGNOSIS — C9000 Multiple myeloma not having achieved remission: Secondary | ICD-10-CM

## 2014-01-29 DIAGNOSIS — Z5112 Encounter for antineoplastic immunotherapy: Secondary | ICD-10-CM

## 2014-01-29 LAB — CBC WITH DIFFERENTIAL/PLATELET
BASO%: 0.2 % (ref 0.0–2.0)
Basophils Absolute: 0 10*3/uL (ref 0.0–0.1)
EOS ABS: 0.1 10*3/uL (ref 0.0–0.5)
EOS%: 2.4 % (ref 0.0–7.0)
HEMATOCRIT: 43 % (ref 34.8–46.6)
HGB: 14.8 g/dL (ref 11.6–15.9)
LYMPH%: 24.6 % (ref 14.0–49.7)
MCH: 32.4 pg (ref 25.1–34.0)
MCHC: 34.4 g/dL (ref 31.5–36.0)
MCV: 94.1 fL (ref 79.5–101.0)
MONO#: 0.6 10*3/uL (ref 0.1–0.9)
MONO%: 10.5 % (ref 0.0–14.0)
NEUT%: 62.3 % (ref 38.4–76.8)
NEUTROS ABS: 3.3 10*3/uL (ref 1.5–6.5)
PLATELETS: 125 10*3/uL — AB (ref 145–400)
RBC: 4.57 10*6/uL (ref 3.70–5.45)
RDW: 14.8 % — ABNORMAL HIGH (ref 11.2–14.5)
WBC: 5.3 10*3/uL (ref 3.9–10.3)
lymph#: 1.3 10*3/uL (ref 0.9–3.3)
nRBC: 0 % (ref 0–0)

## 2014-01-29 LAB — BASIC METABOLIC PANEL (CC13)
Anion Gap: 8 mEq/L (ref 3–11)
BUN: 9.1 mg/dL (ref 7.0–26.0)
CO2: 20 meq/L — AB (ref 22–29)
Calcium: 9.4 mg/dL (ref 8.4–10.4)
Chloride: 109 mEq/L (ref 98–109)
Creatinine: 0.7 mg/dL (ref 0.6–1.1)
Glucose: 88 mg/dl (ref 70–140)
POTASSIUM: 4.6 meq/L (ref 3.5–5.1)
Sodium: 138 mEq/L (ref 136–145)

## 2014-01-29 MED ORDER — SODIUM CHLORIDE 0.9 % IV SOLN
Freq: Once | INTRAVENOUS | Status: AC
  Administered 2014-01-29: 13:00:00 via INTRAVENOUS

## 2014-01-29 MED ORDER — DEXAMETHASONE SODIUM PHOSPHATE 10 MG/ML IJ SOLN
INTRAMUSCULAR | Status: AC
  Filled 2014-01-29: qty 1

## 2014-01-29 MED ORDER — ONDANSETRON 8 MG/NS 50 ML IVPB
INTRAVENOUS | Status: AC
  Filled 2014-01-29: qty 8

## 2014-01-29 MED ORDER — SODIUM CHLORIDE 0.9 % IJ SOLN
10.0000 mL | INTRAMUSCULAR | Status: DC | PRN
  Administered 2014-01-29: 10 mL
  Filled 2014-01-29: qty 10

## 2014-01-29 MED ORDER — DEXAMETHASONE SODIUM PHOSPHATE 10 MG/ML IJ SOLN
10.0000 mg | Freq: Once | INTRAMUSCULAR | Status: AC
  Administered 2014-01-29: 10 mg via INTRAVENOUS

## 2014-01-29 MED ORDER — SODIUM CHLORIDE 0.9 % IV SOLN: Freq: Once | INTRAVENOUS | Status: DC

## 2014-01-29 MED ORDER — DEXTROSE 5 % IV SOLN
27.0000 mg/m2 | Freq: Once | INTRAVENOUS | Status: AC
  Administered 2014-01-29: 44 mg via INTRAVENOUS
  Filled 2014-01-29: qty 22

## 2014-01-29 MED ORDER — ONDANSETRON 8 MG/50ML IVPB (CHCC)
8.0000 mg | Freq: Once | INTRAVENOUS | Status: AC
  Administered 2014-01-29: 8 mg via INTRAVENOUS

## 2014-01-29 MED ORDER — HEPARIN SOD (PORK) LOCK FLUSH 100 UNIT/ML IV SOLN
500.0000 [IU] | Freq: Once | INTRAVENOUS | Status: AC | PRN
  Administered 2014-01-29: 500 [IU]
  Filled 2014-01-29: qty 5

## 2014-01-29 NOTE — Patient Instructions (Signed)
[facility] Discharge Instructions for Patients Receiving Chemotherapy  Today you received the following chemotherapy agents Kyprolis To help prevent nausea and vomiting after your treatment, we encourage you to take your nausea medication as prescribed.  If you develop nausea and vomiting that is not controlled by your nausea medication, call the clinic.   BELOW ARE SYMPTOMS THAT SHOULD BE REPORTED IMMEDIATELY:  *FEVER GREATER THAN 100.5 F  *CHILLS WITH OR WITHOUT FEVER  NAUSEA AND VOMITING THAT IS NOT CONTROLLED WITH YOUR NAUSEA MEDICATION  *UNUSUAL SHORTNESS OF BREATH  *UNUSUAL BRUISING OR BLEEDING  TENDERNESS IN MOUTH AND THROAT WITH OR WITHOUT PRESENCE OF ULCERS  *URINARY PROBLEMS  *BOWEL PROBLEMS  UNUSUAL RASH Items with * indicate a potential emergency and should be followed up as soon as possible.  Feel free to call the clinic you have any questions or concerns. The clinic phone number is [phone].    

## 2014-01-30 ENCOUNTER — Other Ambulatory Visit: Payer: Self-pay | Admitting: *Deleted

## 2014-01-30 ENCOUNTER — Ambulatory Visit (HOSPITAL_BASED_OUTPATIENT_CLINIC_OR_DEPARTMENT_OTHER): Payer: Medicare Other

## 2014-01-30 VITALS — BP 154/98 | HR 76 | Temp 98.2°F

## 2014-01-30 DIAGNOSIS — Z5112 Encounter for antineoplastic immunotherapy: Secondary | ICD-10-CM

## 2014-01-30 DIAGNOSIS — C9000 Multiple myeloma not having achieved remission: Secondary | ICD-10-CM

## 2014-01-30 MED ORDER — ONDANSETRON 8 MG/50ML IVPB (CHCC)
8.0000 mg | Freq: Once | INTRAVENOUS | Status: AC
  Administered 2014-01-30: 8 mg via INTRAVENOUS

## 2014-01-30 MED ORDER — DEXAMETHASONE SODIUM PHOSPHATE 10 MG/ML IJ SOLN
INTRAMUSCULAR | Status: AC
  Filled 2014-01-30: qty 1

## 2014-01-30 MED ORDER — SODIUM CHLORIDE 0.9 % IV SOLN
Freq: Once | INTRAVENOUS | Status: AC
  Administered 2014-01-30: 13:00:00 via INTRAVENOUS

## 2014-01-30 MED ORDER — DEXTROSE 5 % IV SOLN
27.0000 mg/m2 | Freq: Once | INTRAVENOUS | Status: AC
  Administered 2014-01-30: 44 mg via INTRAVENOUS
  Filled 2014-01-30: qty 22

## 2014-01-30 MED ORDER — DEXAMETHASONE SODIUM PHOSPHATE 10 MG/ML IJ SOLN
10.0000 mg | Freq: Once | INTRAMUSCULAR | Status: AC
  Administered 2014-01-30: 10 mg via INTRAVENOUS

## 2014-01-30 MED ORDER — SODIUM CHLORIDE 0.9 % IJ SOLN
10.0000 mL | INTRAMUSCULAR | Status: DC | PRN
  Administered 2014-01-30: 10 mL
  Filled 2014-01-30: qty 10

## 2014-01-30 MED ORDER — HEPARIN SOD (PORK) LOCK FLUSH 100 UNIT/ML IV SOLN
500.0000 [IU] | Freq: Once | INTRAVENOUS | Status: AC | PRN
  Administered 2014-01-30: 500 [IU]
  Filled 2014-01-30: qty 5

## 2014-01-30 MED ORDER — ONDANSETRON 8 MG/NS 50 ML IVPB
INTRAVENOUS | Status: AC
  Filled 2014-01-30: qty 8

## 2014-01-30 NOTE — Patient Instructions (Signed)
[facility] Discharge Instructions for Patients Receiving Chemotherapy  Today you received the following chemotherapy agents Kyprolis To help prevent nausea and vomiting after your treatment, we encourage you to take your nausea medication as prescribed.  If you develop nausea and vomiting that is not controlled by your nausea medication, call the clinic.   BELOW ARE SYMPTOMS THAT SHOULD BE REPORTED IMMEDIATELY:  *FEVER GREATER THAN 100.5 F  *CHILLS WITH OR WITHOUT FEVER  NAUSEA AND VOMITING THAT IS NOT CONTROLLED WITH YOUR NAUSEA MEDICATION  *UNUSUAL SHORTNESS OF BREATH  *UNUSUAL BRUISING OR BLEEDING  TENDERNESS IN MOUTH AND THROAT WITH OR WITHOUT PRESENCE OF ULCERS  *URINARY PROBLEMS  *BOWEL PROBLEMS  UNUSUAL RASH Items with * indicate a potential emergency and should be followed up as soon as possible.  Feel free to call the clinic you have any questions or concerns. The clinic phone number is [phone].    

## 2014-02-02 ENCOUNTER — Encounter: Payer: Self-pay | Admitting: *Deleted

## 2014-02-05 ENCOUNTER — Other Ambulatory Visit (HOSPITAL_BASED_OUTPATIENT_CLINIC_OR_DEPARTMENT_OTHER): Payer: Medicare Other

## 2014-02-05 ENCOUNTER — Ambulatory Visit (HOSPITAL_BASED_OUTPATIENT_CLINIC_OR_DEPARTMENT_OTHER): Payer: Medicare Other

## 2014-02-05 ENCOUNTER — Ambulatory Visit: Payer: Medicare Other

## 2014-02-05 VITALS — BP 128/88 | HR 76 | Temp 97.3°F | Resp 18

## 2014-02-05 VITALS — BP 174/104 | HR 74 | Resp 18

## 2014-02-05 DIAGNOSIS — I10 Essential (primary) hypertension: Secondary | ICD-10-CM

## 2014-02-05 DIAGNOSIS — C9000 Multiple myeloma not having achieved remission: Secondary | ICD-10-CM

## 2014-02-05 DIAGNOSIS — Z95828 Presence of other vascular implants and grafts: Secondary | ICD-10-CM

## 2014-02-05 DIAGNOSIS — Z5112 Encounter for antineoplastic immunotherapy: Secondary | ICD-10-CM

## 2014-02-05 LAB — CBC WITH DIFFERENTIAL/PLATELET
BASO%: 0.2 % (ref 0.0–2.0)
BASOS ABS: 0 10*3/uL (ref 0.0–0.1)
EOS ABS: 0.1 10*3/uL (ref 0.0–0.5)
EOS%: 2.4 % (ref 0.0–7.0)
HEMATOCRIT: 41.2 % (ref 34.8–46.6)
HEMOGLOBIN: 13.7 g/dL (ref 11.6–15.9)
LYMPH#: 1 10*3/uL (ref 0.9–3.3)
LYMPH%: 18.5 % (ref 14.0–49.7)
MCH: 32 pg (ref 25.1–34.0)
MCHC: 33.1 g/dL (ref 31.5–36.0)
MCV: 96.6 fL (ref 79.5–101.0)
MONO#: 0.5 10*3/uL (ref 0.1–0.9)
MONO%: 9.5 % (ref 0.0–14.0)
NEUT#: 3.7 10*3/uL (ref 1.5–6.5)
NEUT%: 69.4 % (ref 38.4–76.8)
Platelets: 108 10*3/uL — ABNORMAL LOW (ref 145–400)
RBC: 4.27 10*6/uL (ref 3.70–5.45)
RDW: 15 % — ABNORMAL HIGH (ref 11.2–14.5)
WBC: 5.3 10*3/uL (ref 3.9–10.3)

## 2014-02-05 LAB — BASIC METABOLIC PANEL (CC13)
Anion Gap: 9 mEq/L (ref 3–11)
BUN: 11.3 mg/dL (ref 7.0–26.0)
CO2: 21 mEq/L — ABNORMAL LOW (ref 22–29)
Calcium: 9.3 mg/dL (ref 8.4–10.4)
Chloride: 110 mEq/L — ABNORMAL HIGH (ref 98–109)
Creatinine: 0.7 mg/dL (ref 0.6–1.1)
GLUCOSE: 98 mg/dL (ref 70–140)
POTASSIUM: 3.9 meq/L (ref 3.5–5.1)
Sodium: 141 mEq/L (ref 136–145)

## 2014-02-05 MED ORDER — ONDANSETRON 8 MG/50ML IVPB (CHCC)
8.0000 mg | Freq: Once | INTRAVENOUS | Status: AC
Start: 2014-02-05 — End: 2014-02-05
  Administered 2014-02-05: 8 mg via INTRAVENOUS

## 2014-02-05 MED ORDER — SODIUM CHLORIDE 0.9 % IJ SOLN
10.0000 mL | INTRAMUSCULAR | Status: DC | PRN
  Administered 2014-02-05: 10 mL via INTRAVENOUS
  Filled 2014-02-05: qty 10

## 2014-02-05 MED ORDER — DEXAMETHASONE SODIUM PHOSPHATE 10 MG/ML IJ SOLN
INTRAMUSCULAR | Status: AC
  Filled 2014-02-05: qty 1

## 2014-02-05 MED ORDER — CLONIDINE HCL 0.1 MG PO TABS
ORAL_TABLET | ORAL | Status: AC
  Filled 2014-02-05: qty 1

## 2014-02-05 MED ORDER — HEPARIN SOD (PORK) LOCK FLUSH 100 UNIT/ML IV SOLN
500.0000 [IU] | Freq: Once | INTRAVENOUS | Status: AC | PRN
  Administered 2014-02-05: 500 [IU]
  Filled 2014-02-05: qty 5

## 2014-02-05 MED ORDER — DEXTROSE 5 % IV SOLN
27.0000 mg/m2 | Freq: Once | INTRAVENOUS | Status: AC
  Administered 2014-02-05: 44 mg via INTRAVENOUS
  Filled 2014-02-05: qty 22

## 2014-02-05 MED ORDER — HEPARIN SOD (PORK) LOCK FLUSH 100 UNIT/ML IV SOLN
500.0000 [IU] | Freq: Once | INTRAVENOUS | Status: AC
  Administered 2014-02-05: 500 [IU] via INTRAVENOUS
  Filled 2014-02-05: qty 5

## 2014-02-05 MED ORDER — SODIUM CHLORIDE 0.9 % IJ SOLN
10.0000 mL | INTRAMUSCULAR | Status: DC | PRN
  Administered 2014-02-05: 10 mL
  Filled 2014-02-05: qty 10

## 2014-02-05 MED ORDER — ONDANSETRON 8 MG/NS 50 ML IVPB
INTRAVENOUS | Status: AC
  Filled 2014-02-05: qty 8

## 2014-02-05 MED ORDER — DEXAMETHASONE SODIUM PHOSPHATE 10 MG/ML IJ SOLN
10.0000 mg | Freq: Once | INTRAMUSCULAR | Status: AC
Start: 2014-02-05 — End: 2014-02-05
  Administered 2014-02-05: 10 mg via INTRAVENOUS

## 2014-02-05 MED ORDER — CLONIDINE HCL 0.1 MG PO TABS
0.1000 mg | ORAL_TABLET | Freq: Once | ORAL | Status: AC
  Administered 2014-02-05: 0.1 mg via ORAL

## 2014-02-05 MED ORDER — SODIUM CHLORIDE 0.9 % IV SOLN
Freq: Once | INTRAVENOUS | Status: AC
  Administered 2014-02-05: 13:00:00 via INTRAVENOUS

## 2014-02-05 NOTE — Patient Instructions (Signed)
You received a dose of clonidine 0.1 mg for elevated blood pressure. We recorded your BP at 184/110. Please follow up with your primary care provider to evaluate your blood pressure and your current medication.   [facility] Discharge Instructions for Patients Receiving Chemotherapy  Today you received the following chemotherapy agent: Kyprolis  To help prevent nausea and vomiting after your treatment, we encourage you to take your nausea medication as prescribed.    If you develop nausea and vomiting that is not controlled by your nausea medication, call the clinic.   BELOW ARE SYMPTOMS THAT SHOULD BE REPORTED IMMEDIATELY:  *FEVER GREATER THAN 100.5 F  *CHILLS WITH OR WITHOUT FEVER  NAUSEA AND VOMITING THAT IS NOT CONTROLLED WITH YOUR NAUSEA MEDICATION  *UNUSUAL SHORTNESS OF BREATH  *UNUSUAL BRUISING OR BLEEDING  TENDERNESS IN MOUTH AND THROAT WITH OR WITHOUT PRESENCE OF ULCERS  *URINARY PROBLEMS  *BOWEL PROBLEMS  UNUSUAL RASH Items with * indicate a potential emergency and should be followed up as soon as possible.  Feel free to call the clinic you have any questions or concerns. The clinic phone number is [phone].

## 2014-02-05 NOTE — Patient Instructions (Signed)

## 2014-02-05 NOTE — Progress Notes (Signed)
Dr. Alvy Bimler notified of BP 174/104 at 1230 and recheck BP 184/110 at 1240 in infusion room. Verbal order for one-time clonidine. Also MD instructs patient to call PCP and follow up with BP. MD alerted that Bmet results not up yet, but per MD, ok to proceed with treatment without these lab results. MD does want patient to receive pre-hydration per protocol. Patient informed of this plan and instructions and she verbalizes understanding.

## 2014-02-06 ENCOUNTER — Other Ambulatory Visit: Payer: Self-pay

## 2014-02-06 ENCOUNTER — Ambulatory Visit (HOSPITAL_BASED_OUTPATIENT_CLINIC_OR_DEPARTMENT_OTHER): Payer: Medicare Other

## 2014-02-06 VITALS — BP 168/94 | HR 79 | Temp 98.7°F | Resp 18

## 2014-02-06 DIAGNOSIS — Z5112 Encounter for antineoplastic immunotherapy: Secondary | ICD-10-CM

## 2014-02-06 DIAGNOSIS — C9002 Multiple myeloma in relapse: Secondary | ICD-10-CM

## 2014-02-06 DIAGNOSIS — C9000 Multiple myeloma not having achieved remission: Secondary | ICD-10-CM

## 2014-02-06 MED ORDER — SODIUM CHLORIDE 0.9 % IV SOLN
Freq: Once | INTRAVENOUS | Status: AC
  Administered 2014-02-06: 13:00:00 via INTRAVENOUS

## 2014-02-06 MED ORDER — DEXTROSE 5 % IV SOLN
27.0000 mg/m2 | Freq: Once | INTRAVENOUS | Status: AC
  Administered 2014-02-06: 44 mg via INTRAVENOUS
  Filled 2014-02-06: qty 22

## 2014-02-06 MED ORDER — DEXAMETHASONE SODIUM PHOSPHATE 10 MG/ML IJ SOLN
10.0000 mg | Freq: Once | INTRAMUSCULAR | Status: AC
  Administered 2014-02-06: 10 mg via INTRAVENOUS

## 2014-02-06 MED ORDER — SODIUM CHLORIDE 0.9 % IJ SOLN
10.0000 mL | INTRAMUSCULAR | Status: DC | PRN
  Administered 2014-02-06: 10 mL
  Filled 2014-02-06: qty 10

## 2014-02-06 MED ORDER — HEPARIN SOD (PORK) LOCK FLUSH 100 UNIT/ML IV SOLN
500.0000 [IU] | Freq: Once | INTRAVENOUS | Status: AC | PRN
  Administered 2014-02-06: 500 [IU]
  Filled 2014-02-06: qty 5

## 2014-02-06 MED ORDER — ONDANSETRON 8 MG/NS 50 ML IVPB
INTRAVENOUS | Status: AC
  Filled 2014-02-06: qty 8

## 2014-02-06 MED ORDER — DEXAMETHASONE SODIUM PHOSPHATE 10 MG/ML IJ SOLN
INTRAMUSCULAR | Status: AC
  Filled 2014-02-06: qty 1

## 2014-02-06 MED ORDER — ONDANSETRON 8 MG/50ML IVPB (CHCC)
8.0000 mg | Freq: Once | INTRAVENOUS | Status: AC
  Administered 2014-02-06: 8 mg via INTRAVENOUS

## 2014-02-06 NOTE — Patient Instructions (Signed)
[facility] Discharge Instructions for Patients Receiving Chemotherapy  Today you received the following chemotherapy agents KYPROLIS To help prevent nausea and vomiting after your treatment, we encourage you to take your nausea medication as prescribed. If you develop nausea and vomiting that is not controlled by your nausea medication, call the clinic.   BELOW ARE SYMPTOMS THAT SHOULD BE REPORTED IMMEDIATELY:  *FEVER GREATER THAN 100.5 F  *CHILLS WITH OR WITHOUT FEVER  NAUSEA AND VOMITING THAT IS NOT CONTROLLED WITH YOUR NAUSEA MEDICATION  *UNUSUAL SHORTNESS OF BREATH  *UNUSUAL BRUISING OR BLEEDING  TENDERNESS IN MOUTH AND THROAT WITH OR WITHOUT PRESENCE OF ULCERS  *URINARY PROBLEMS  *BOWEL PROBLEMS  UNUSUAL RASH Items with * indicate a potential emergency and should be followed up as soon as possible.  Feel free to call the clinic you have any questions or concerns. The clinic phone number is [phone].    

## 2014-02-13 ENCOUNTER — Ambulatory Visit (INDEPENDENT_AMBULATORY_CARE_PROVIDER_SITE_OTHER): Payer: Medicare Other | Admitting: Family Medicine

## 2014-02-13 ENCOUNTER — Encounter: Payer: Self-pay | Admitting: Family Medicine

## 2014-02-13 ENCOUNTER — Ambulatory Visit (INDEPENDENT_AMBULATORY_CARE_PROVIDER_SITE_OTHER)
Admission: RE | Admit: 2014-02-13 | Discharge: 2014-02-13 | Disposition: A | Discharge status: Home / Self Care | Payer: Medicare Other | Source: Ambulatory Visit | Attending: Family Medicine | Admitting: Family Medicine

## 2014-02-13 VITALS — BP 150/110 | HR 116 | Temp 98.2°F | Wt 127.0 lb

## 2014-02-13 DIAGNOSIS — R0602 Shortness of breath: Secondary | ICD-10-CM

## 2014-02-13 DIAGNOSIS — F17200 Nicotine dependence, unspecified, uncomplicated: Secondary | ICD-10-CM

## 2014-02-13 DIAGNOSIS — C9000 Multiple myeloma not having achieved remission: Secondary | ICD-10-CM

## 2014-02-13 DIAGNOSIS — I10 Essential (primary) hypertension: Secondary | ICD-10-CM

## 2014-02-13 MED ORDER — PREDNISONE 20 MG PO TABS: ORAL_TABLET | ORAL | Status: DC

## 2014-02-13 MED ORDER — LISINOPRIL 40 MG PO TABS: 40.0000 mg | ORAL_TABLET | Freq: Every day | ORAL | Status: DC

## 2014-02-13 NOTE — Progress Notes (Signed)
   Subjective:    Patient ID: Kendra Johns, female    DOB: 1950/11/13, 63 y.o.   MRN: [id number]  HPI brnnie is a 63 year old single female smoker,,,,,,, down to 2 cigarettes a day because she can no longer [place] so short of breath,,,,, who comes in today for evaluation of shortness of breath for 2 months and elevated blood pressure and tobacco abuse  She's had 2 bone marrow transplants for multiple myeloma and is currently getting chemotherapy and radiation treatments. She's been short of breath for 3 months. They've advised to stop smoking but she was smoking a pack of cigarettes a day. Now she is down to one or 2 a day because she can't breathe.  Just a history of hypertension and takes lisinopril 20 mg daily     Review of Systems    review of systems otherwise negative again the shortness of breath has been present for 3 months. Objective:   Physical Exam  Well-developed thin female smells of tobacco  Vital signs stable except BP 150/110 pulse initially was 116 now with rest is down to 90 and pulse ox on room air 96%. HEENT negative neck was supple no adenopathy pulmonary exam shows decreased but symmetrical breath sounds mild wheezing on forced expiration. Cardiac exam normal. Tattoos anterior chest wall demarcating radiation zones      Assessment & Plan:  Tobacco abuse,,,,,,,, stop smoking completely given samples of Chantix  Shortness of breath secondary to radiation treatment of multiple myeloma  Hypertension not at goal increase lisinopril to 40 mg daily followup by Dr. Leanne Chang in one week or 2,

## 2014-02-13 NOTE — Patient Instructions (Signed)
Pulmonary consult ASAP  Stop smoking completely  Chantix,,,,,,,,,,, as directed  Prednisone 20 mg,,,,,,, as outlined  Chest x-ray today

## 2014-02-13 NOTE — Progress Notes (Signed)
Pre visit review using our clinic review tool, if applicable. No additional management support is needed unless otherwise documented below in the visit note. 

## 2014-02-14 ENCOUNTER — Institutional Professional Consult (permissible substitution): Payer: Self-pay | Admitting: Internal Medicine

## 2014-02-19 ENCOUNTER — Encounter: Payer: Self-pay | Admitting: Hematology and Oncology

## 2014-02-19 ENCOUNTER — Ambulatory Visit (HOSPITAL_BASED_OUTPATIENT_CLINIC_OR_DEPARTMENT_OTHER): Payer: Medicare Other

## 2014-02-19 ENCOUNTER — Other Ambulatory Visit (HOSPITAL_BASED_OUTPATIENT_CLINIC_OR_DEPARTMENT_OTHER): Payer: Medicare Other

## 2014-02-19 ENCOUNTER — Ambulatory Visit (HOSPITAL_BASED_OUTPATIENT_CLINIC_OR_DEPARTMENT_OTHER): Payer: Medicare Other | Admitting: Hematology and Oncology

## 2014-02-19 ENCOUNTER — Telehealth: Payer: Self-pay | Admitting: Hematology and Oncology

## 2014-02-19 VITALS — BP 148/89 | HR 85

## 2014-02-19 VITALS — BP 160/104 | HR 79 | Temp 97.7°F | Resp 18 | Ht 64.0 in | Wt 128.0 lb

## 2014-02-19 DIAGNOSIS — C9000 Multiple myeloma not having achieved remission: Secondary | ICD-10-CM

## 2014-02-19 DIAGNOSIS — F17200 Nicotine dependence, unspecified, uncomplicated: Secondary | ICD-10-CM

## 2014-02-19 DIAGNOSIS — Z5112 Encounter for antineoplastic immunotherapy: Secondary | ICD-10-CM

## 2014-02-19 LAB — BASIC METABOLIC PANEL (CC13)
Anion Gap: 10 mEq/L (ref 3–11)
BUN: 13.9 mg/dL (ref 7.0–26.0)
CALCIUM: 9.6 mg/dL (ref 8.4–10.4)
CO2: 22 mEq/L (ref 22–29)
CREATININE: 0.8 mg/dL (ref 0.6–1.1)
Chloride: 106 mEq/L (ref 98–109)
GLUCOSE: 149 mg/dL — AB (ref 70–140)
Potassium: 4 mEq/L (ref 3.5–5.1)
Sodium: 138 mEq/L (ref 136–145)

## 2014-02-19 LAB — CBC WITH DIFFERENTIAL/PLATELET
BASO%: 0.4 % (ref 0.0–2.0)
Basophils Absolute: 0 10*3/uL (ref 0.0–0.1)
EOS%: 0.3 % (ref 0.0–7.0)
Eosinophils Absolute: 0 10*3/uL (ref 0.0–0.5)
HCT: 42.8 % (ref 34.8–46.6)
HGB: 14.5 g/dL (ref 11.6–15.9)
LYMPH%: 11 % — ABNORMAL LOW (ref 14.0–49.7)
MCH: 32.5 pg (ref 25.1–34.0)
MCHC: 33.9 g/dL (ref 31.5–36.0)
MCV: 96 fL (ref 79.5–101.0)
MONO#: 0.5 10*3/uL (ref 0.1–0.9)
MONO%: 4.7 % (ref 0.0–14.0)
NEUT#: 8 10*3/uL — ABNORMAL HIGH (ref 1.5–6.5)
NEUT%: 83.6 % — ABNORMAL HIGH (ref 38.4–76.8)
Platelets: 181 10*3/uL (ref 145–400)
RBC: 4.46 10*6/uL (ref 3.70–5.45)
RDW: 14.1 % (ref 11.2–14.5)
WBC: 9.6 10*3/uL (ref 3.9–10.3)
lymph#: 1.1 10*3/uL (ref 0.9–3.3)

## 2014-02-19 MED ORDER — DEXAMETHASONE SODIUM PHOSPHATE 10 MG/ML IJ SOLN
10.0000 mg | Freq: Once | INTRAMUSCULAR | Status: AC
  Administered 2014-02-19: 10 mg via INTRAVENOUS

## 2014-02-19 MED ORDER — HEPARIN SOD (PORK) LOCK FLUSH 100 UNIT/ML IV SOLN
500.0000 [IU] | Freq: Once | INTRAVENOUS | Status: AC | PRN
  Administered 2014-02-19: 500 [IU]
  Filled 2014-02-19: qty 5

## 2014-02-19 MED ORDER — ONDANSETRON 8 MG/50ML IVPB (CHCC)
8.0000 mg | Freq: Once | INTRAVENOUS | Status: AC
  Administered 2014-02-19: 8 mg via INTRAVENOUS

## 2014-02-19 MED ORDER — SODIUM CHLORIDE 0.9 % IV SOLN
Freq: Once | INTRAVENOUS | Status: AC
  Administered 2014-02-19: 15:00:00 via INTRAVENOUS

## 2014-02-19 MED ORDER — DEXAMETHASONE SODIUM PHOSPHATE 10 MG/ML IJ SOLN
INTRAMUSCULAR | Status: AC
  Filled 2014-02-19: qty 1

## 2014-02-19 MED ORDER — SODIUM CHLORIDE 0.9 % IJ SOLN
10.0000 mL | INTRAMUSCULAR | Status: DC | PRN
  Administered 2014-02-19: 10 mL
  Filled 2014-02-19: qty 10

## 2014-02-19 MED ORDER — ONDANSETRON 8 MG/NS 50 ML IVPB
INTRAVENOUS | Status: AC
  Filled 2014-02-19: qty 8

## 2014-02-19 MED ORDER — DEXTROSE 5 % IV SOLN
27.0000 mg/m2 | Freq: Once | INTRAVENOUS | Status: AC
  Administered 2014-02-19: 44 mg via INTRAVENOUS
  Filled 2014-02-19: qty 22

## 2014-02-19 NOTE — Patient Instructions (Signed)
[facility] Discharge Instructions for Patients Receiving Chemotherapy  Today you received the following chemotherapy agents :  Kyprolis.  To help prevent nausea and vomiting after your treatment, we encourage you to take your nausea medication as instructed by your physician.   If you develop nausea and vomiting that is not controlled by your nausea medication, call the clinic.   BELOW ARE SYMPTOMS THAT SHOULD BE REPORTED IMMEDIATELY:  *FEVER GREATER THAN 100.5 F  *CHILLS WITH OR WITHOUT FEVER  NAUSEA AND VOMITING THAT IS NOT CONTROLLED WITH YOUR NAUSEA MEDICATION  *UNUSUAL SHORTNESS OF BREATH  *UNUSUAL BRUISING OR BLEEDING  TENDERNESS IN MOUTH AND THROAT WITH OR WITHOUT PRESENCE OF ULCERS  *URINARY PROBLEMS  *BOWEL PROBLEMS  UNUSUAL RASH Items with * indicate a potential emergency and should be followed up as soon as possible.  Feel free to call the clinic you have any questions or concerns. The clinic phone number is [phone].    

## 2014-02-19 NOTE — Progress Notes (Signed)
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   01/03/2007 -  Radiation Therapy Approximate date only, received RT for plasmacytoma followed by surgery   09/14/2007 Initial Diagnosis MULTIPLE  MYELOMA   10/05/2007 Bone Marrow Biopsy Bm biopsy was negative   06/18/2008 Bone Marrow Biopsy BM biopsy was negative   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 [facility]   04/13/2011 Relapse/Recurrence Disease relapse   04/14/2011 Bone Marrow Biopsy Bm biopsy showed 2 % plasma cell   04/27/2011 Relapse/Recurrence Disease relapse, treated with Velcade/Cytoxan/Dex   09/07/2011 - 07/28/2013 Chemotherapy She has been receiving Velcade   12/27/2011 Bone Marrow Transplant 2nd transplant at [facility]   07/28/2013 Relapse/Recurrence Chemo is stopped due to progression of disease   08/29/2013 Imaging PEt/CT showed recurrence of disease with new lesion on her rib with compression fracture   09/13/2013 Bone Marrow Biopsy BM biopsy is hypercellular with 6% plasma cell   10/10/2013 - 10/20/2013 Radiation Therapy Started on palliative XRT for rib pain   11/27/2013 -  Chemotherapy The patient starts chemotherapy with Carfilzomib    INTERVAL HISTORY: Kendra Johns 63 y.o. female returns for further followup. Her back pain continued to improve and she is no longer taking pain medicine at such high dosage. Her primary doctor  is tapering her off her pain medicine. She still has some constipation, resolved with MOM. She is actively trying to quit smoking. She is awaiting for dental evaluation.  I have reviewed the past medical history, past surgical history, social history and family history with the patient and they are unchanged from previous note.  ALLERGIES:  is allergic to codeine and ibuprofen.  MEDICATIONS:  Current Outpatient Prescriptions  Medication Sig Dispense Refill  . acyclovir (ZOVIRAX) 400 MG tablet Take 1 tablet (400 mg total) by mouth daily.  30 tablet  3  . albuterol (PROVENTIL HFA;VENTOLIN HFA) 108 (90 BASE) MCG/ACT inhaler Inhale 2 puffs into the lungs every 4 (four) hours as needed for wheezing or shortness of breath.  1 Inhaler  0  . Cholecalciferol (VITAMIN D-3) 5000 UNITS TABS Take 1 tablet by mouth every morning.      . lidocaine-prilocaine (EMLA) cream Apply 1 application topically as needed.  30 g  0  . lisinopril (PRINIVIL,ZESTRIL) 40 MG tablet Take 1 tablet (40 mg total) by mouth daily.  90 tablet  3  . magnesium hydroxide (MILK OF MAGNESIA) 400 MG/5ML suspension Take 30 mLs by mouth daily as needed for mild constipation.      Marland Kitchen morphine (MS CONTIN) 100 MG 12 hr tablet Take 100 mg by mouth 3 (three) times daily.       Marland Kitchen morphine (MS CONTIN) 30 MG 12 hr tablet Take 30 mg by mouth every 12 (twelve) hours.      . predniSONE (DELTASONE) 20 MG tablet 2 tabs x 3 days, 1 tab x 3 days, 1/2 tab x 3 days, 1/2 tab M,W,F x  2 weeks  40 tablet  1  . prochlorperazine (COMPAZINE) 10 MG tablet Take 1 tablet (10 mg total) by mouth every 6 (six) hours as needed for nausea.  60 tablet  3  . [DISCONTINUED] amLODipine (NORVASC) 5 MG tablet Take 1 tablet (5 mg total) by mouth daily.  90 tablet  3  . [DISCONTINUED] gabapentin (NEURONTIN) 600 MG tablet Take 600 mg by mouth 3 (three) times daily.         No current facility-administered medications for this visit.    REVIEW OF SYSTEMS:   Constitutional:  Denies fevers, chills or abnormal weight loss Eyes: Denies blurriness of vision Ears, nose, mouth, throat, and face: Denies mucositis or sore throat Respiratory: Denies cough, dyspnea or wheezes Cardiovascular: Denies palpitation, chest discomfort or lower extremity swelling Gastrointestinal:  Denies nausea, heartburn or change in bowel habits Skin: Denies abnormal skin rashes Lymphatics: Denies new lymphadenopathy or easy bruising Neurological:Denies numbness, tingling or new weaknesses Behavioral/Psych: Mood is stable, no new changes  All other systems were reviewed with the patient and are negative.  PHYSICAL EXAMINATION: ECOG PERFORMANCE STATUS: 1 - Symptomatic but completely ambulatory  Filed Vitals:   02/19/14 1344  BP: 160/104  Pulse: 79  Temp: 97.7 F (36.5 C)  Resp: 18   Filed Weights   02/19/14 1344  Weight: 128 lb (58.06 kg)    GENERAL:alert, no distress and comfortable SKIN: skin color, texture, turgor are normal, no rashes or significant lesions EYES: normal, Conjunctiva are pink and non-injected, sclera clear OROPHARYNX:no exudate, no erythema and lips, buccal mucosa, and tongue normal . Poor dentition is noted NECK: supple, thyroid normal size, non-tender, without nodularity LYMPH:  no palpable lymphadenopathy in the cervical, axillary or inguinal LUNGS: clear to auscultation and percussion with normal breathing effort HEART: regular rate & rhythm and no murmurs and no lower extremity edema ABDOMEN:abdomen soft, non-tender and normal bowel sounds Musculoskeletal:no cyanosis of digits and no clubbing  NEURO: alert & oriented x 3 with fluent speech, no focal motor/sensory deficits  LABORATORY DATA:  I have reviewed the data as listed    Component Value Date/Time   NA 141 02/05/2014 1210   NA 138 10/06/2013 1920   K 3.9 02/05/2014 1210   K 4.3 10/06/2013 1920   CL 103 10/06/2013 1920   CL 107 03/10/2013 1014   CO2 21* 02/05/2014 1210   CO2 25 10/06/2013 1920    GLUCOSE 98 02/05/2014 1210   GLUCOSE 99 10/06/2013 1920   GLUCOSE 111* 03/10/2013 1014   GLUCOSE 98 08/31/2006 1024   BUN 11.3 02/05/2014 1210   BUN 10 10/06/2013 1920   CREATININE 0.7 02/05/2014 1210   CREATININE 0.63 10/06/2013 1920   CALCIUM 9.3 02/05/2014 1210   CALCIUM 9.8 10/06/2013 1920   PROT 6.8 01/22/2014 0924   PROT 7.2 10/06/2013 1920   ALBUMIN 3.8 01/22/2014 0924   ALBUMIN 3.7 10/06/2013 1920   AST 28 01/22/2014 0924   AST 16 10/06/2013 1920   ALT 25 01/22/2014 0924   ALT 11 10/06/2013 1920   ALKPHOS 139 01/22/2014 0924   ALKPHOS 100 10/06/2013 1920   BILITOT 0.57 01/22/2014 0924   BILITOT 0.3 10/06/2013 1920   GFRNONAA >90 10/06/2013 1920   GFRAA >90 10/06/2013 1920    No results found for this basename: SPEP,  UPEP,   kappa and lambda light chains    Lab Results  Component Value Date   WBC 9.6 02/19/2014   NEUTROABS 8.0* 02/19/2014   HGB 14.5 02/19/2014  HCT 42.8 02/19/2014   MCV 96.0 02/19/2014   PLT 181 02/19/2014      Chemistry      Component Value Date/Time   NA 141 02/05/2014 1210   NA 138 10/06/2013 1920   K 3.9 02/05/2014 1210   K 4.3 10/06/2013 1920   CL 103 10/06/2013 1920   CL 107 03/10/2013 1014   CO2 21* 02/05/2014 1210   CO2 25 10/06/2013 1920   BUN 11.3 02/05/2014 1210   BUN 10 10/06/2013 1920   CREATININE 0.7 02/05/2014 1210   CREATININE 0.63 10/06/2013 1920      Component Value Date/Time   CALCIUM 9.3 02/05/2014 1210   CALCIUM 9.8 10/06/2013 1920   ALKPHOS 139 01/22/2014 0924   ALKPHOS 100 10/06/2013 1920   AST 28 01/22/2014 0924   AST 16 10/06/2013 1920   ALT 25 01/22/2014 0924   ALT 11 10/06/2013 1920   BILITOT 0.57 01/22/2014 0924   BILITOT 0.3 10/06/2013 1920     ASSESSMENT & PLAN:  #1 multiple myeloma Clinically, since she received radiation therapy, she looked better. However she is still recovering from her recent rib fracture and her performance status is slowly improving Due to poor dentition, Zometa will not be given and until she gets dental clearance. She will  continue on Carfilzomib. Her light chains corresponding to recent treatment. #2 bone pain, resolved Her pain is under better control. She will continue followup with her primary doctor for medication adjustments . She'll continue vitamin D supplements #3 poor dentition I recommend she see her dentist after her pain is better controlled and she completes radiation treatment. We will start Zometa only after she obtained dental clearance #4 active smoking I congratulated her effort of attempting to quit smoking. I continue to encourage her. #5 antimicrobial prophylaxis She will continue acyclovir  Orders Placed This Encounter  Procedures  . CBC with Differential    Standing Status: Future     Number of Occurrences:      Standing Expiration Date: 02/19/2015  . Comprehensive metabolic panel    Standing Status: Future     Number of Occurrences:      Standing Expiration Date: 02/19/2015  . SPEP & IFE with QIG    Standing Status: Future     Number of Occurrences:      Standing Expiration Date: 02/19/2015  . Kappa/lambda light chains    Standing Status: Future     Number of Occurrences:      Standing Expiration Date: 02/19/2015   All questions were answered. The patient knows to call the clinic with any problems, questions or concerns. No barriers to learning was detected. I spent 25 minutes counseling the patient face to face. The total time spent in the appointment was 30 minutes and more than 50% was on counseling and review of test results     Heath Lark, MD 02/19/2014 2:20 PM

## 2014-02-19 NOTE — Telephone Encounter (Signed)
gv adn rpinted aptps ched and avs for pt for April adn may....sed added tx....pt wanted tx earlier...Marland Kitchenasjusted some

## 2014-02-20 ENCOUNTER — Ambulatory Visit (HOSPITAL_BASED_OUTPATIENT_CLINIC_OR_DEPARTMENT_OTHER): Payer: Medicare Other

## 2014-02-20 VITALS — BP 149/108 | HR 76 | Temp 98.5°F

## 2014-02-20 DIAGNOSIS — Z5111 Encounter for antineoplastic chemotherapy: Secondary | ICD-10-CM

## 2014-02-20 DIAGNOSIS — C9000 Multiple myeloma not having achieved remission: Secondary | ICD-10-CM

## 2014-02-20 MED ORDER — HEPARIN SOD (PORK) LOCK FLUSH 100 UNIT/ML IV SOLN
500.0000 [IU] | Freq: Once | INTRAVENOUS | Status: AC | PRN
  Administered 2014-02-20: 500 [IU]
  Filled 2014-02-20: qty 5

## 2014-02-20 MED ORDER — SODIUM CHLORIDE 0.9 % IJ SOLN
10.0000 mL | INTRAMUSCULAR | Status: DC | PRN
  Administered 2014-02-20: 10 mL
  Filled 2014-02-20: qty 10

## 2014-02-20 MED ORDER — DEXTROSE 5 % IV SOLN
27.0000 mg/m2 | Freq: Once | INTRAVENOUS | Status: AC
  Administered 2014-02-20: 44 mg via INTRAVENOUS
  Filled 2014-02-20: qty 22

## 2014-02-20 MED ORDER — SODIUM CHLORIDE 0.9 % IV SOLN: Freq: Once | INTRAVENOUS | Status: DC

## 2014-02-20 MED ORDER — ONDANSETRON 8 MG/50ML IVPB (CHCC)
8.0000 mg | Freq: Once | INTRAVENOUS | Status: AC
  Administered 2014-02-20: 8 mg via INTRAVENOUS

## 2014-02-20 MED ORDER — DEXAMETHASONE SODIUM PHOSPHATE 10 MG/ML IJ SOLN
INTRAMUSCULAR | Status: AC
  Filled 2014-02-20: qty 1

## 2014-02-20 MED ORDER — ONDANSETRON 8 MG/NS 50 ML IVPB
INTRAVENOUS | Status: AC
  Filled 2014-02-20: qty 8

## 2014-02-20 MED ORDER — SODIUM CHLORIDE 0.9 % IV SOLN
Freq: Once | INTRAVENOUS | Status: AC
  Administered 2014-02-20: 12:00:00 via INTRAVENOUS

## 2014-02-20 MED ORDER — DEXAMETHASONE SODIUM PHOSPHATE 10 MG/ML IJ SOLN
10.0000 mg | Freq: Once | INTRAMUSCULAR | Status: AC
  Administered 2014-02-20: 10 mg via INTRAVENOUS

## 2014-02-20 NOTE — Patient Instructions (Signed)
[facility] Discharge Instructions for Patients Receiving Chemotherapy  Today you received the following chemotherapy agents Kyprolis.  To help prevent nausea and vomiting after your treatment, we encourage you to take your nausea medication.   If you develop nausea and vomiting that is not controlled by your nausea medication, call the clinic.   BELOW ARE SYMPTOMS THAT SHOULD BE REPORTED IMMEDIATELY:  *FEVER GREATER THAN 100.5 F  *CHILLS WITH OR WITHOUT FEVER  NAUSEA AND VOMITING THAT IS NOT CONTROLLED WITH YOUR NAUSEA MEDICATION  *UNUSUAL SHORTNESS OF BREATH  *UNUSUAL BRUISING OR BLEEDING  TENDERNESS IN MOUTH AND THROAT WITH OR WITHOUT PRESENCE OF ULCERS  *URINARY PROBLEMS  *BOWEL PROBLEMS  UNUSUAL RASH Items with * indicate a potential emergency and should be followed up as soon as possible.  Feel free to call the clinic you have any questions or concerns. The clinic phone number is [phone].    

## 2014-02-21 LAB — SPEP & IFE WITH QIG
ALBUMIN ELP: 63.5 % (ref 55.8–66.1)
Alpha-1-Globulin: 5.2 % — ABNORMAL HIGH (ref 2.9–4.9)
Alpha-2-Globulin: 13.1 % — ABNORMAL HIGH (ref 7.1–11.8)
BETA 2: 3 % — AB (ref 3.2–6.5)
BETA GLOBULIN: 6 % (ref 4.7–7.2)
Gamma Globulin: 9.2 % — ABNORMAL LOW (ref 11.1–18.8)
IGG (IMMUNOGLOBIN G), SERUM: 684 mg/dL — AB (ref 690–1700)
IgA: 27 mg/dL — ABNORMAL LOW (ref 69–380)
IgM, Serum: 59 mg/dL (ref 52–322)
Total Protein, Serum Electrophoresis: 6.7 g/dL (ref 6.0–8.3)

## 2014-02-21 LAB — KAPPA/LAMBDA LIGHT CHAINS
KAPPA LAMBDA RATIO: 9.98 — AB (ref 0.26–1.65)
Kappa free light chain: 9.68 mg/dL — ABNORMAL HIGH (ref 0.33–1.94)
LAMBDA FREE LGHT CHN: 0.97 mg/dL (ref 0.57–2.63)

## 2014-02-26 ENCOUNTER — Ambulatory Visit (HOSPITAL_BASED_OUTPATIENT_CLINIC_OR_DEPARTMENT_OTHER): Payer: Medicare Other

## 2014-02-26 VITALS — BP 138/95 | HR 81 | Temp 97.9°F

## 2014-02-26 VITALS — BP 128/104 | HR 92 | Temp 97.0°F | Resp 16

## 2014-02-26 DIAGNOSIS — C9000 Multiple myeloma not having achieved remission: Secondary | ICD-10-CM

## 2014-02-26 DIAGNOSIS — Z5112 Encounter for antineoplastic immunotherapy: Secondary | ICD-10-CM

## 2014-02-26 DIAGNOSIS — Z95828 Presence of other vascular implants and grafts: Secondary | ICD-10-CM

## 2014-02-26 LAB — CBC WITH DIFFERENTIAL/PLATELET
BASO%: 0.4 % (ref 0.0–2.0)
Basophils Absolute: 0 10*3/uL (ref 0.0–0.1)
EOS%: 1.3 % (ref 0.0–7.0)
Eosinophils Absolute: 0.1 10*3/uL (ref 0.0–0.5)
HCT: 46.6 % (ref 34.8–46.6)
HGB: 16 g/dL — ABNORMAL HIGH (ref 11.6–15.9)
LYMPH#: 1.6 10*3/uL (ref 0.9–3.3)
LYMPH%: 18.6 % (ref 14.0–49.7)
MCH: 33.1 pg (ref 25.1–34.0)
MCHC: 34.3 g/dL (ref 31.5–36.0)
MCV: 96.3 fL (ref 79.5–101.0)
MONO#: 0.9 10*3/uL (ref 0.1–0.9)
MONO%: 10.6 % (ref 0.0–14.0)
NEUT#: 5.9 10*3/uL (ref 1.5–6.5)
NEUT%: 69.1 % (ref 38.4–76.8)
Platelets: 121 10*3/uL — ABNORMAL LOW (ref 145–400)
RBC: 4.84 10*6/uL (ref 3.70–5.45)
RDW: 14.4 % (ref 11.2–14.5)
WBC: 8.5 10*3/uL (ref 3.9–10.3)
nRBC: 0 % (ref 0–0)

## 2014-02-26 LAB — BASIC METABOLIC PANEL (CC13)
ANION GAP: 11 meq/L (ref 3–11)
BUN: 15.8 mg/dL (ref 7.0–26.0)
CO2: 22 mEq/L (ref 22–29)
Calcium: 10 mg/dL (ref 8.4–10.4)
Chloride: 107 mEq/L (ref 98–109)
Creatinine: 0.8 mg/dL (ref 0.6–1.1)
Glucose: 112 mg/dl (ref 70–140)
Potassium: 4.1 mEq/L (ref 3.5–5.1)
SODIUM: 140 meq/L (ref 136–145)

## 2014-02-26 MED ORDER — DEXAMETHASONE SODIUM PHOSPHATE 10 MG/ML IJ SOLN
INTRAMUSCULAR | Status: AC
  Filled 2014-02-26: qty 1

## 2014-02-26 MED ORDER — ONDANSETRON 8 MG/50ML IVPB (CHCC)
8.0000 mg | Freq: Once | INTRAVENOUS | Status: AC
  Administered 2014-02-26: 8 mg via INTRAVENOUS

## 2014-02-26 MED ORDER — HEPARIN SOD (PORK) LOCK FLUSH 100 UNIT/ML IV SOLN
500.0000 [IU] | Freq: Once | INTRAVENOUS | Status: DC | PRN
  Filled 2014-02-26: qty 5

## 2014-02-26 MED ORDER — SODIUM CHLORIDE 0.9 % IJ SOLN
10.0000 mL | INTRAMUSCULAR | Status: DC | PRN
  Filled 2014-02-26: qty 10

## 2014-02-26 MED ORDER — ONDANSETRON 8 MG/NS 50 ML IVPB
INTRAVENOUS | Status: AC
  Filled 2014-02-26: qty 8

## 2014-02-26 MED ORDER — SODIUM CHLORIDE 0.9 % IJ SOLN
10.0000 mL | INTRAMUSCULAR | Status: DC | PRN
  Administered 2014-02-26 (×2): 10 mL via INTRAVENOUS
  Filled 2014-02-26: qty 10

## 2014-02-26 MED ORDER — DEXTROSE 5 % IV SOLN
27.0000 mg/m2 | Freq: Once | INTRAVENOUS | Status: AC
  Administered 2014-02-26: 44 mg via INTRAVENOUS
  Filled 2014-02-26: qty 22

## 2014-02-26 MED ORDER — SODIUM CHLORIDE 0.9 % IV SOLN
Freq: Once | INTRAVENOUS | Status: AC
  Administered 2014-02-26: 13:00:00 via INTRAVENOUS

## 2014-02-26 MED ORDER — DEXAMETHASONE SODIUM PHOSPHATE 10 MG/ML IJ SOLN
10.0000 mg | Freq: Once | INTRAMUSCULAR | Status: AC
  Administered 2014-02-26: 10 mg via INTRAVENOUS

## 2014-02-26 MED ORDER — HEPARIN SOD (PORK) LOCK FLUSH 100 UNIT/ML IV SOLN
500.0000 [IU] | Freq: Once | INTRAVENOUS | Status: AC
  Administered 2014-02-26: 500 [IU] via INTRAVENOUS
  Filled 2014-02-26: qty 5

## 2014-02-26 NOTE — Patient Instructions (Signed)
[facility] Discharge Instructions for Patients Receiving Chemotherapy  Today you received the following chemotherapy agents Kyprolis To help prevent nausea and vomiting after your treatment, we encourage you to take your nausea medication as prescribed.  If you develop nausea and vomiting that is not controlled by your nausea medication, call the clinic.   BELOW ARE SYMPTOMS THAT SHOULD BE REPORTED IMMEDIATELY:  *FEVER GREATER THAN 100.5 F  *CHILLS WITH OR WITHOUT FEVER  NAUSEA AND VOMITING THAT IS NOT CONTROLLED WITH YOUR NAUSEA MEDICATION  *UNUSUAL SHORTNESS OF BREATH  *UNUSUAL BRUISING OR BLEEDING  TENDERNESS IN MOUTH AND THROAT WITH OR WITHOUT PRESENCE OF ULCERS  *URINARY PROBLEMS  *BOWEL PROBLEMS  UNUSUAL RASH Items with * indicate a potential emergency and should be followed up as soon as possible.  Feel free to call the clinic you have any questions or concerns. The clinic phone number is [phone].    

## 2014-02-26 NOTE — Patient Instructions (Signed)

## 2014-02-27 ENCOUNTER — Ambulatory Visit (HOSPITAL_BASED_OUTPATIENT_CLINIC_OR_DEPARTMENT_OTHER): Payer: Medicare Other

## 2014-02-27 ENCOUNTER — Ambulatory Visit: Payer: Self-pay

## 2014-02-27 VITALS — BP 134/98 | HR 99 | Temp 97.4°F

## 2014-02-27 DIAGNOSIS — Z5112 Encounter for antineoplastic immunotherapy: Secondary | ICD-10-CM

## 2014-02-27 DIAGNOSIS — C9000 Multiple myeloma not having achieved remission: Secondary | ICD-10-CM

## 2014-02-27 MED ORDER — SODIUM CHLORIDE 0.9 % IV SOLN: Freq: Once | INTRAVENOUS | Status: DC

## 2014-02-27 MED ORDER — HEPARIN SOD (PORK) LOCK FLUSH 100 UNIT/ML IV SOLN
500.0000 [IU] | Freq: Once | INTRAVENOUS | Status: AC | PRN
  Administered 2014-02-27: 500 [IU]
  Filled 2014-02-27: qty 5

## 2014-02-27 MED ORDER — SODIUM CHLORIDE 0.9 % IV SOLN
Freq: Once | INTRAVENOUS | Status: AC
  Administered 2014-02-27: 10:00:00 via INTRAVENOUS

## 2014-02-27 MED ORDER — DEXAMETHASONE SODIUM PHOSPHATE 10 MG/ML IJ SOLN
INTRAMUSCULAR | Status: AC
  Filled 2014-02-27: qty 1

## 2014-02-27 MED ORDER — ONDANSETRON 8 MG/50ML IVPB (CHCC)
8.0000 mg | Freq: Once | INTRAVENOUS | Status: AC
  Administered 2014-02-27: 8 mg via INTRAVENOUS

## 2014-02-27 MED ORDER — ONDANSETRON 8 MG/NS 50 ML IVPB
INTRAVENOUS | Status: AC
  Filled 2014-02-27: qty 8

## 2014-02-27 MED ORDER — DEXAMETHASONE SODIUM PHOSPHATE 10 MG/ML IJ SOLN
10.0000 mg | Freq: Once | INTRAMUSCULAR | Status: AC
  Administered 2014-02-27: 10 mg via INTRAVENOUS

## 2014-02-27 MED ORDER — DEXTROSE 5 % IV SOLN
27.0000 mg/m2 | Freq: Once | INTRAVENOUS | Status: AC
  Administered 2014-02-27: 44 mg via INTRAVENOUS
  Filled 2014-02-27: qty 22

## 2014-02-27 MED ORDER — SODIUM CHLORIDE 0.9 % IJ SOLN
10.0000 mL | INTRAMUSCULAR | Status: DC | PRN
  Administered 2014-02-27: 10 mL
  Filled 2014-02-27: qty 10

## 2014-03-05 ENCOUNTER — Other Ambulatory Visit (HOSPITAL_BASED_OUTPATIENT_CLINIC_OR_DEPARTMENT_OTHER): Payer: Medicare Other

## 2014-03-05 ENCOUNTER — Ambulatory Visit (HOSPITAL_BASED_OUTPATIENT_CLINIC_OR_DEPARTMENT_OTHER): Payer: Medicare Other

## 2014-03-05 ENCOUNTER — Ambulatory Visit: Payer: Self-pay

## 2014-03-05 ENCOUNTER — Other Ambulatory Visit: Payer: Self-pay

## 2014-03-05 VITALS — BP 151/81 | HR 77 | Temp 97.8°F | Resp 18

## 2014-03-05 DIAGNOSIS — Z5112 Encounter for antineoplastic immunotherapy: Secondary | ICD-10-CM

## 2014-03-05 DIAGNOSIS — C9000 Multiple myeloma not having achieved remission: Secondary | ICD-10-CM

## 2014-03-05 LAB — CBC WITH DIFFERENTIAL/PLATELET
BASO%: 0.6 % (ref 0.0–2.0)
BASOS ABS: 0 10*3/uL (ref 0.0–0.1)
EOS%: 2.1 % (ref 0.0–7.0)
Eosinophils Absolute: 0.1 10*3/uL (ref 0.0–0.5)
HCT: 42.7 % (ref 34.8–46.6)
HGB: 14.2 g/dL (ref 11.6–15.9)
LYMPH%: 19.4 % (ref 14.0–49.7)
MCH: 32.5 pg (ref 25.1–34.0)
MCHC: 33.3 g/dL (ref 31.5–36.0)
MCV: 97.7 fL (ref 79.5–101.0)
MONO#: 0.5 10*3/uL (ref 0.1–0.9)
MONO%: 10.6 % (ref 0.0–14.0)
NEUT#: 3.3 10*3/uL (ref 1.5–6.5)
NEUT%: 67.3 % (ref 38.4–76.8)
Platelets: 120 10*3/uL — ABNORMAL LOW (ref 145–400)
RBC: 4.37 10*6/uL (ref 3.70–5.45)
RDW: 14.7 % — ABNORMAL HIGH (ref 11.2–14.5)
WBC: 4.9 10*3/uL (ref 3.9–10.3)
lymph#: 1 10*3/uL (ref 0.9–3.3)

## 2014-03-05 LAB — BASIC METABOLIC PANEL (CC13)
Anion Gap: 8 mEq/L (ref 3–11)
BUN: 11.1 mg/dL (ref 7.0–26.0)
CO2: 23 meq/L (ref 22–29)
Calcium: 9.7 mg/dL (ref 8.4–10.4)
Chloride: 110 mEq/L — ABNORMAL HIGH (ref 98–109)
Creatinine: 0.8 mg/dL (ref 0.6–1.1)
Glucose: 114 mg/dl (ref 70–140)
Potassium: 4.5 mEq/L (ref 3.5–5.1)
SODIUM: 141 meq/L (ref 136–145)

## 2014-03-05 MED ORDER — SODIUM CHLORIDE 0.9 % IJ SOLN
10.0000 mL | INTRAMUSCULAR | Status: DC | PRN
  Administered 2014-03-05: 10 mL
  Filled 2014-03-05: qty 10

## 2014-03-05 MED ORDER — DEXAMETHASONE SODIUM PHOSPHATE 10 MG/ML IJ SOLN
INTRAMUSCULAR | Status: AC
  Filled 2014-03-05: qty 1

## 2014-03-05 MED ORDER — DEXAMETHASONE SODIUM PHOSPHATE 10 MG/ML IJ SOLN
10.0000 mg | Freq: Once | INTRAMUSCULAR | Status: AC
  Administered 2014-03-05: 10 mg via INTRAVENOUS

## 2014-03-05 MED ORDER — ONDANSETRON 8 MG/50ML IVPB (CHCC)
8.0000 mg | Freq: Once | INTRAVENOUS | Status: AC
  Administered 2014-03-05: 8 mg via INTRAVENOUS

## 2014-03-05 MED ORDER — SODIUM CHLORIDE 0.9 % IV SOLN
Freq: Once | INTRAVENOUS | Status: AC
  Administered 2014-03-05: 10:00:00 via INTRAVENOUS

## 2014-03-05 MED ORDER — DEXTROSE 5 % IV SOLN
27.0000 mg/m2 | Freq: Once | INTRAVENOUS | Status: AC
  Administered 2014-03-05: 44 mg via INTRAVENOUS
  Filled 2014-03-05: qty 22

## 2014-03-05 MED ORDER — ONDANSETRON 8 MG/NS 50 ML IVPB
INTRAVENOUS | Status: AC
Start: 2014-03-05 — End: 2014-03-05
  Filled 2014-03-05: qty 8

## 2014-03-05 MED ORDER — HEPARIN SOD (PORK) LOCK FLUSH 100 UNIT/ML IV SOLN
500.0000 [IU] | Freq: Once | INTRAVENOUS | Status: AC | PRN
  Administered 2014-03-05: 500 [IU]
  Filled 2014-03-05: qty 5

## 2014-03-05 NOTE — Patient Instructions (Signed)
[facility] Discharge Instructions for Patients Receiving Chemotherapy  Today you received the following chemotherapy agents: Kyprolis  To help prevent nausea and vomiting after your treatment, we encourage you to take your nausea medication: Compazine 10 mg every 6 hrs as needed.    If you develop nausea and vomiting that is not controlled by your nausea medication, call the clinic.   BELOW ARE SYMPTOMS THAT SHOULD BE REPORTED IMMEDIATELY:  *FEVER GREATER THAN 100.5 F  *CHILLS WITH OR WITHOUT FEVER  NAUSEA AND VOMITING THAT IS NOT CONTROLLED WITH YOUR NAUSEA MEDICATION  *UNUSUAL SHORTNESS OF BREATH  *UNUSUAL BRUISING OR BLEEDING  TENDERNESS IN MOUTH AND THROAT WITH OR WITHOUT PRESENCE OF ULCERS  *URINARY PROBLEMS  *BOWEL PROBLEMS  UNUSUAL RASH Items with * indicate a potential emergency and should be followed up as soon as possible.  Feel free to call the clinic you have any questions or concerns. The clinic phone number is [phone].    

## 2014-03-06 ENCOUNTER — Ambulatory Visit: Payer: Self-pay

## 2014-03-06 ENCOUNTER — Ambulatory Visit (HOSPITAL_BASED_OUTPATIENT_CLINIC_OR_DEPARTMENT_OTHER): Payer: Medicare Other

## 2014-03-06 VITALS — BP 168/101 | HR 82 | Temp 98.1°F | Resp 20

## 2014-03-06 DIAGNOSIS — Z5112 Encounter for antineoplastic immunotherapy: Secondary | ICD-10-CM

## 2014-03-06 DIAGNOSIS — C9000 Multiple myeloma not having achieved remission: Secondary | ICD-10-CM

## 2014-03-06 MED ORDER — DEXAMETHASONE SODIUM PHOSPHATE 10 MG/ML IJ SOLN
10.0000 mg | Freq: Once | INTRAMUSCULAR | Status: AC
  Administered 2014-03-06: 10 mg via INTRAVENOUS

## 2014-03-06 MED ORDER — DEXAMETHASONE SODIUM PHOSPHATE 10 MG/ML IJ SOLN
INTRAMUSCULAR | Status: AC
  Filled 2014-03-06: qty 1

## 2014-03-06 MED ORDER — ONDANSETRON 8 MG/50ML IVPB (CHCC)
8.0000 mg | Freq: Once | INTRAVENOUS | Status: AC
  Administered 2014-03-06: 8 mg via INTRAVENOUS

## 2014-03-06 MED ORDER — HEPARIN SOD (PORK) LOCK FLUSH 100 UNIT/ML IV SOLN
500.0000 [IU] | Freq: Once | INTRAVENOUS | Status: AC | PRN
  Administered 2014-03-06: 500 [IU]
  Filled 2014-03-06: qty 5

## 2014-03-06 MED ORDER — SODIUM CHLORIDE 0.9 % IV SOLN
Freq: Once | INTRAVENOUS | Status: AC
  Administered 2014-03-06: 10:00:00 via INTRAVENOUS

## 2014-03-06 MED ORDER — SODIUM CHLORIDE 0.9 % IJ SOLN
10.0000 mL | INTRAMUSCULAR | Status: DC | PRN
  Administered 2014-03-06: 10 mL
  Filled 2014-03-06: qty 10

## 2014-03-06 MED ORDER — DEXTROSE 5 % IV SOLN
27.0000 mg/m2 | Freq: Once | INTRAVENOUS | Status: AC
  Administered 2014-03-06: 44 mg via INTRAVENOUS
  Filled 2014-03-06: qty 22

## 2014-03-06 MED ORDER — ONDANSETRON 8 MG/NS 50 ML IVPB
INTRAVENOUS | Status: AC
  Filled 2014-03-06: qty 8

## 2014-03-06 NOTE — Patient Instructions (Signed)
[facility] Discharge Instructions for Patients Receiving Chemotherapy  Today you received the following chemotherapy agents Kyprolis  To help prevent nausea and vomiting after your treatment, we encourage you to take your nausea medication as needed   If you develop nausea and vomiting that is not controlled by your nausea medication, call the clinic.   BELOW ARE SYMPTOMS THAT SHOULD BE REPORTED IMMEDIATELY:  *FEVER GREATER THAN 100.5 F  *CHILLS WITH OR WITHOUT FEVER  NAUSEA AND VOMITING THAT IS NOT CONTROLLED WITH YOUR NAUSEA MEDICATION  *UNUSUAL SHORTNESS OF BREATH  *UNUSUAL BRUISING OR BLEEDING  TENDERNESS IN MOUTH AND THROAT WITH OR WITHOUT PRESENCE OF ULCERS  *URINARY PROBLEMS  *BOWEL PROBLEMS  UNUSUAL RASH Items with * indicate a potential emergency and should be followed up as soon as possible.  Feel free to call the clinic you have any questions or concerns. The clinic phone number is [phone].

## 2014-03-19 ENCOUNTER — Telehealth: Payer: Self-pay | Admitting: Hematology and Oncology

## 2014-03-19 ENCOUNTER — Other Ambulatory Visit (HOSPITAL_BASED_OUTPATIENT_CLINIC_OR_DEPARTMENT_OTHER): Payer: Medicare Other

## 2014-03-19 ENCOUNTER — Ambulatory Visit (HOSPITAL_BASED_OUTPATIENT_CLINIC_OR_DEPARTMENT_OTHER): Payer: Medicare Other | Admitting: Hematology and Oncology

## 2014-03-19 ENCOUNTER — Encounter: Payer: Self-pay | Admitting: Hematology and Oncology

## 2014-03-19 ENCOUNTER — Ambulatory Visit (HOSPITAL_BASED_OUTPATIENT_CLINIC_OR_DEPARTMENT_OTHER): Payer: Medicare Other

## 2014-03-19 VITALS — BP 166/95 | HR 80 | Temp 98.0°F | Resp 20 | Ht 64.0 in | Wt 131.1 lb

## 2014-03-19 DIAGNOSIS — I10 Essential (primary) hypertension: Secondary | ICD-10-CM

## 2014-03-19 DIAGNOSIS — C9000 Multiple myeloma not having achieved remission: Secondary | ICD-10-CM

## 2014-03-19 DIAGNOSIS — R0602 Shortness of breath: Secondary | ICD-10-CM

## 2014-03-19 DIAGNOSIS — F17200 Nicotine dependence, unspecified, uncomplicated: Secondary | ICD-10-CM

## 2014-03-19 DIAGNOSIS — Z5112 Encounter for antineoplastic immunotherapy: Secondary | ICD-10-CM

## 2014-03-19 LAB — CBC WITH DIFFERENTIAL/PLATELET
BASO%: 0.8 % (ref 0.0–2.0)
BASOS ABS: 0.1 10*3/uL (ref 0.0–0.1)
EOS%: 0.5 % (ref 0.0–7.0)
Eosinophils Absolute: 0 10*3/uL (ref 0.0–0.5)
HCT: 44.5 % (ref 34.8–46.6)
HEMOGLOBIN: 14.5 g/dL (ref 11.6–15.9)
LYMPH#: 0.7 10*3/uL — AB (ref 0.9–3.3)
LYMPH%: 8.2 % — ABNORMAL LOW (ref 14.0–49.7)
MCH: 32.7 pg (ref 25.1–34.0)
MCHC: 32.7 g/dL (ref 31.5–36.0)
MCV: 100.1 fL (ref 79.5–101.0)
MONO#: 0.3 10*3/uL (ref 0.1–0.9)
MONO%: 4.3 % (ref 0.0–14.0)
NEUT#: 6.8 10*3/uL — ABNORMAL HIGH (ref 1.5–6.5)
NEUT%: 86.2 % — ABNORMAL HIGH (ref 38.4–76.8)
Platelets: 207 10*3/uL (ref 145–400)
RBC: 4.44 10*6/uL (ref 3.70–5.45)
RDW: 15 % — AB (ref 11.2–14.5)
WBC: 7.9 10*3/uL (ref 3.9–10.3)

## 2014-03-19 LAB — COMPREHENSIVE METABOLIC PANEL (CC13)
ALT: 26 U/L (ref 0–55)
AST: 25 U/L (ref 5–34)
Albumin: 4.2 g/dL (ref 3.5–5.0)
Alkaline Phosphatase: 90 U/L (ref 40–150)
Anion Gap: 14 mEq/L — ABNORMAL HIGH (ref 3–11)
BUN: 13 mg/dL (ref 7.0–26.0)
CALCIUM: 9.5 mg/dL (ref 8.4–10.4)
CHLORIDE: 106 meq/L (ref 98–109)
CO2: 23 mEq/L (ref 22–29)
Creatinine: 0.8 mg/dL (ref 0.6–1.1)
Glucose: 112 mg/dl (ref 70–140)
POTASSIUM: 4.2 meq/L (ref 3.5–5.1)
Sodium: 143 mEq/L (ref 136–145)
TOTAL PROTEIN: 7 g/dL (ref 6.4–8.3)
Total Bilirubin: 0.57 mg/dL (ref 0.20–1.20)

## 2014-03-19 LAB — TECHNOLOGIST REVIEW

## 2014-03-19 MED ORDER — LIDOCAINE-PRILOCAINE 2.5-2.5 % EX CREA: 1.0000 "application " | TOPICAL_CREAM | CUTANEOUS | Status: DC | PRN

## 2014-03-19 MED ORDER — SODIUM CHLORIDE 0.9 % IJ SOLN
10.0000 mL | INTRAMUSCULAR | Status: DC | PRN
  Administered 2014-03-19: 10 mL
  Filled 2014-03-19: qty 10

## 2014-03-19 MED ORDER — ONDANSETRON 8 MG/50ML IVPB (CHCC)
8.0000 mg | Freq: Once | INTRAVENOUS | Status: AC
  Administered 2014-03-19: 8 mg via INTRAVENOUS

## 2014-03-19 MED ORDER — DEXAMETHASONE SODIUM PHOSPHATE 10 MG/ML IJ SOLN
10.0000 mg | Freq: Once | INTRAMUSCULAR | Status: AC
  Administered 2014-03-19: 10 mg via INTRAVENOUS

## 2014-03-19 MED ORDER — HYDROCHLOROTHIAZIDE 25 MG PO TABS: 25.0000 mg | ORAL_TABLET | Freq: Every day | ORAL | Status: DC

## 2014-03-19 MED ORDER — SODIUM CHLORIDE 0.9 % IV SOLN
Freq: Once | INTRAVENOUS | Status: AC
  Administered 2014-03-19: 14:00:00 via INTRAVENOUS

## 2014-03-19 MED ORDER — ONDANSETRON 8 MG/NS 50 ML IVPB
INTRAVENOUS | Status: AC
  Filled 2014-03-19: qty 8

## 2014-03-19 MED ORDER — DEXTROSE 5 % IV SOLN
27.0000 mg/m2 | Freq: Once | INTRAVENOUS | Status: AC
  Administered 2014-03-19: 44 mg via INTRAVENOUS
  Filled 2014-03-19: qty 22

## 2014-03-19 MED ORDER — DEXAMETHASONE SODIUM PHOSPHATE 10 MG/ML IJ SOLN
INTRAMUSCULAR | Status: AC
  Filled 2014-03-19: qty 1

## 2014-03-19 MED ORDER — HEPARIN SOD (PORK) LOCK FLUSH 100 UNIT/ML IV SOLN
500.0000 [IU] | Freq: Once | INTRAVENOUS | Status: AC | PRN
  Administered 2014-03-19: 500 [IU]
  Filled 2014-03-19: qty 5

## 2014-03-19 NOTE — Patient Instructions (Signed)
Carfilzomib injection What is this medicine? CARFILZOMIB (kar FILZ oh mib) is a chemotherapy drug that works by slowing or stopping cancer cell growth. This medicine is used to treat multiple myeloma. This medicine may be used for other purposes; ask your health care provider or pharmacist if you have questions. COMMON BRAND NAME(S): KYPROLIS What should I tell my health care provider before I take this medicine? They need to know if you have any of these conditions: -heart disease -irregular heartbeat -liver disease -lung or breathing disease -an unusual or allergic reaction to carfilzomib, or other medicines, foods, dyes, or preservatives -pregnant or trying to get pregnant -breast-feeding How should I use this medicine? This medicine is for injection or infusion into a vein. It is given by a health care professional in a hospital or clinic setting. Talk to your pediatrician regarding the use of this medicine in children. Special care may be needed. Overdosage: If you think you've taken too much of this medicine contact a poison control center or emergency room at once. Overdosage: If you think you have taken too much of this medicine contact a poison control center or emergency room at once. NOTE: This medicine is only for you. Do not share this medicine with others. What if I miss a dose? It is important not to miss your dose. Call your doctor or health care professional if you are unable to keep an appointment. What may interact with this medicine? Interactions are not expected. Give your health care provider a list of all the medicines, herbs, non-prescription drugs, or dietary supplements you use. Also tell them if you smoke, drink alcohol, or use illegal drugs. Some items may interact with your medicine. This list may not describe all possible interactions. Give your health care provider a list of all the medicines, herbs, non-prescription drugs, or dietary supplements you use. Also  tell them if you smoke, drink alcohol, or use illegal drugs. Some items may interact with your medicine. What should I watch for while using this medicine? Your condition will be monitored carefully while you are receiving this medicine. Report any side effects. Continue your course of treatment even though you feel ill unless your doctor tells you to stop. Call your doctor or health care professional for advice if you get a fever, chills or sore throat, or other symptoms of a cold or flu. Do not treat yourself. Try to avoid being around people who are sick. Do not become pregnant while taking this medicine. Women should inform their doctor if they wish to become pregnant or think they might be pregnant. There is a potential for serious side effects to an unborn child. Talk to your health care professional or pharmacist for more information. Do not breast-feed an infant while taking this medicine. Check with your doctor or health care professional if you get an attack of severe diarrhea, nausea and vomiting, or if you sweat a lot. The loss of too much body fluid can make it dangerous for you to take this medicine. You may get dizzy. Do not drive, use machinery, or do anything that needs mental alertness until you know how this medicine affects you. Do not stand or sit up quickly, especially if you are an older patient. This reduces the risk of dizzy or fainting spells. What side effects may I notice from receiving this medicine? Side effects that you should report to your doctor or health care professional as soon as possible: -allergic reactions like skin rash, itching or hives,  swelling of the face, lips, or tongue -breathing problems -chest pain or palpitationschest tightness -cough -dark urine -dizziness -feeling faint or lightheaded -fever or chills -general ill feeling or flu-like symptoms -light-colored stools -palpitations -right upper belly pain -swelling of the legs or ankles -unusual  bleeding or bruising -unusually weak or tired -yellowing of the eyes or skin  Side effects that usually do not require medical attention (Report these to your doctor or health care professional if they continue or are bothersome.): -diarrhea -headache -nausea, vomiting -tiredness This list may not describe all possible side effects. Call your doctor for medical advice about side effects. You may report side effects to FDA at 1-800-FDA-1088. Where should I keep my medicine? This drug is given in a hospital or clinic and will not be stored at home. NOTE: This sheet is a summary. It may not cover all possible information. If you have questions about this medicine, talk to your doctor, pharmacist, or health care provider.  2014, [employer]. (2012-04-08 17:02:29)  

## 2014-03-19 NOTE — Telephone Encounter (Signed)
gv adn pritned aptp sched and avs for pt for May and June....sed added tx.

## 2014-03-19 NOTE — Progress Notes (Signed)
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   01/03/2007 -  Radiation Therapy Approximate date only, received RT for plasmacytoma followed by surgery   09/14/2007 Initial Diagnosis MULTIPLE  MYELOMA   10/05/2007 Bone Marrow Biopsy Bm biopsy was negative   06/18/2008 Bone Marrow Biopsy BM biopsy was negative   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   07/28/2013 Relapse/Recurrence Chemo is stopped due to progression of disease   08/29/2013 Imaging PEt/CT showed recurrence of disease with new lesion on her rib with compression fracture   09/13/2013 Bone Marrow Biopsy BM biopsy is hypercellular with 6% plasma cell   10/10/2013 - 10/20/2013 Radiation Therapy Started on palliative XRT for rib pain   11/27/2013 -  Chemotherapy The patient starts chemotherapy with Carfilzomib    INTERVAL HISTORY: [name] 63 y.o. female returns for further followup. She has new complaints of shortness of breath on minimal exertion. Her blood pressure at home has been running very high. She continued to smoke and  is attempting to quit smoking. Denies any recent cough. Her back pain is improving. She still awaiting for dental evaluation and treatment for osteonecrosis of the jaw.  I have reviewed the past medical history, past surgical history, social history and family history with the patient and they are unchanged from previous note.  ALLERGIES:  is allergic to codeine and ibuprofen.  MEDICATIONS:  Current Outpatient Prescriptions  Medication Sig Dispense Refill  . acyclovir (ZOVIRAX) 400 MG tablet Take 1 tablet (400 mg total) by mouth daily.  30 tablet  3  . albuterol (PROVENTIL HFA;VENTOLIN HFA) 108 (90 BASE) MCG/ACT inhaler Inhale 2 puffs into the lungs every 4 (four) hours as needed for wheezing or shortness of breath.  1 Inhaler  0  . Cholecalciferol (VITAMIN D-3) 5000 UNITS TABS Take 1 tablet by mouth every morning.      . lidocaine-prilocaine (EMLA) cream Apply 1 application topically as needed.  30 g  0  . lisinopril (PRINIVIL,ZESTRIL) 40 MG tablet Take 1 tablet (40 mg total) by mouth daily.  90 tablet  3  . magnesium hydroxide (MILK OF MAGNESIA) 400 MG/5ML suspension Take 30 mLs by mouth daily as needed for mild constipation.      Marland Kitchen morphine (MS CONTIN) 100 MG 12 hr tablet Take 100 mg by mouth 3 (three) times daily.       Marland Kitchen morphine (MS CONTIN) 30 MG 12 hr tablet Take 30 mg by mouth every 12 (twelve) hours.      . predniSONE (DELTASONE) 20 MG tablet 2 tabs x 3 days, 1 tab x 3 days, 1/2 tab x 3 days, 1/2 tab M,W,F x 2  weeks  40 tablet  1  . prochlorperazine (COMPAZINE) 10 MG tablet Take 1 tablet (10 mg total) by mouth every 6 (six) hours as needed for nausea.  60 tablet  3  . hydrochlorothiazide (HYDRODIURIL) 25 MG tablet Take 1 tablet (25 mg total) by mouth daily.  30 tablet  3  . lidocaine-prilocaine (EMLA) cream Apply 1 application topically as needed.  30 g  0  . [DISCONTINUED] amLODipine (NORVASC) 5 MG tablet Take 1 tablet (5 mg total) by mouth daily.  90 tablet  3  . [DISCONTINUED]  gabapentin (NEURONTIN) 600 MG tablet Take 600 mg by mouth 3 (three) times daily.         No current facility-administered medications for this visit.   Facility-Administered Medications Ordered in Other Visits  Medication Dose Route Frequency Provider Last Rate Last Dose  . sodium chloride 0.9 % injection 10 mL  10 mL Intravenous PRN Heath Lark, MD   10 mL at 02/26/14 1425    REVIEW OF SYSTEMS:   Constitutional: Denies fevers, chills or abnormal weight loss Eyes: Denies blurriness of vision Ears, nose, mouth, throat, and face: Denies mucositis or sore throat Cardiovascular: Denies palpitation, chest discomfort or lower extremity swelling Gastrointestinal:  Denies nausea, heartburn or change in bowel habits Skin: Denies abnormal skin rashes Lymphatics: Denies new lymphadenopathy or easy bruising Neurological:Denies numbness, tingling or new weaknesses Behavioral/Psych: Mood is stable, no new changes  All other systems were reviewed with the patient and are negative.  PHYSICAL EXAMINATION: ECOG PERFORMANCE STATUS: 1 - Symptomatic but completely ambulatory  Filed Vitals:   03/19/14 1231  BP: 166/95  Pulse: 80  Temp: 98 F (36.7 C)  Resp: 20   Filed Weights   03/19/14 1231  Weight: 131 lb 1.6 oz (59.467 kg)    GENERAL:alert, no distress and comfortable SKIN: skin color, texture, turgor are normal, no rashes or significant lesions EYES: normal, Conjunctiva are pink and non-injected, sclera clear OROPHARYNX:no exudate, no erythema and lips, buccal mucosa, and tongue normal  NECK: supple, thyroid normal size, non-tender, without nodularity LYMPH:  no palpable lymphadenopathy in the cervical, axillary or inguinal LUNGS: clear to auscultation and percussion with normal breathing effort HEART: regular rate & rhythm and no murmurs and no lower extremity edema ABDOMEN:abdomen soft, non-tender and normal bowel sounds Musculoskeletal:no cyanosis of digits and no clubbing  NEURO:  alert & oriented x 3 with fluent speech, no focal motor/sensory deficits  LABORATORY DATA:  I have reviewed the data as listed    Component Value Date/Time   NA 143 03/19/2014 1205   NA 138 10/06/2013 1920   K 4.2 03/19/2014 1205   K 4.3 10/06/2013 1920   CL 103 10/06/2013 1920   CL 107 03/10/2013 1014   CO2 23 03/19/2014 1205   CO2 25 10/06/2013 1920   GLUCOSE 112 03/19/2014 1205   GLUCOSE 99 10/06/2013 1920   GLUCOSE 111* 03/10/2013 1014   GLUCOSE 98 08/31/2006 1024   BUN 13.0 03/19/2014 1205   BUN 10 10/06/2013 1920   CREATININE 0.8 03/19/2014 1205   CREATININE 0.63 10/06/2013 1920   CALCIUM 9.5 03/19/2014 1205   CALCIUM 9.8 10/06/2013 1920   PROT 7.0 03/19/2014 1205   PROT 7.2 10/06/2013 1920   ALBUMIN 4.2 03/19/2014 1205   ALBUMIN 3.7 10/06/2013 1920   AST 25 03/19/2014 1205   AST 16 10/06/2013 1920   ALT 26 03/19/2014 1205   ALT 11 10/06/2013 1920   ALKPHOS 90 03/19/2014 1205   ALKPHOS  100 10/06/2013 1920   BILITOT 0.57 03/19/2014 1205   BILITOT 0.3 10/06/2013 1920   GFRNONAA >90 10/06/2013 1920   GFRAA >90 10/06/2013 1920    No results found for this basename: SPEP,  UPEP,   kappa and lambda light chains    Lab Results  Component Value Date   WBC 7.9 03/19/2014   NEUTROABS 6.8* 03/19/2014   HGB 14.5 03/19/2014   HCT 44.5 03/19/2014   MCV 100.1 03/19/2014   PLT 207 03/19/2014      Chemistry      Component Value Date/Time   NA 143 03/19/2014 1205   NA 138 10/06/2013 1920   K 4.2 03/19/2014 1205   K 4.3 10/06/2013 1920   CL 103 10/06/2013 1920   CL 107 03/10/2013 1014   CO2 23 03/19/2014 1205   CO2 25 10/06/2013 1920   BUN 13.0 03/19/2014 1205   BUN 10 10/06/2013 1920   CREATININE 0.8 03/19/2014 1205   CREATININE 0.63 10/06/2013 1920      Component Value Date/Time   CALCIUM 9.5 03/19/2014 1205   CALCIUM 9.8 10/06/2013 1920   ALKPHOS 90 03/19/2014 1205   ALKPHOS 100 10/06/2013 1920   AST 25 03/19/2014 1205   AST 16 10/06/2013 1920   ALT 26 03/19/2014 1205   ALT 11 10/06/2013 1920   BILITOT 0.57  03/19/2014 1205   BILITOT 0.3 10/06/2013 1920     ASSESSMENT & PLAN:  #1 multiple myeloma Clinically, since she received radiation therapy, she looked better. However she is still recovering from her recent rib fracture and her performance status is slowly improving Due to poor dentition, Zometa will not be given and until she gets dental clearance. She will continue on Carfilzomib. Her light chains corresponding to recent treatment. I will modify her treatment days in the next 2 weeks due to holidays. #2 bone pain, resolved Her pain is under better control. She will continue followup with her primary doctor for medication adjustments . She'll continue vitamin D supplements #3 poor dentition I recommend she see her dentist after her pain is better controlled and she completes radiation treatment. We will start Zometa only after she obtained dental clearance #4 active smoking I congratulated her effort of attempting to quit smoking. I continue to encourage her. #5 antimicrobial prophylaxis She will continue acyclovir #6 severe hypertension I recommend adding hydrochlorothiazide in the morning. I will reassess her blood pressure in a month and we'll continue to add medications as needed #7 shortness of breath I suspect this could be due to COPD and high blood pressure. The patient requested oxygen prescription. I recommend she gets evaluation at the pulmonologist. I do not prescribe oxygen in my clinic #7 disability I renewed her disability placard.  Orders Placed This Encounter  Procedures  . Comprehensive metabolic panel    Standing Status: Future     Number of Occurrences:      Standing Expiration Date: 03/19/2015  . SPEP & IFE with QIG    Standing Status: Future     Number of Occurrences:      Standing Expiration Date: 03/19/2015  . Kappa/lambda light chains    Standing Status: Future     Number of Occurrences:      Standing Expiration Date: 03/19/2015   All questions were  answered. The patient knows to call the clinic with any problems, questions or concerns. No barriers to learning was detected.    Heath Lark, MD 03/19/2014 12:51 PM

## 2014-03-20 ENCOUNTER — Ambulatory Visit (HOSPITAL_BASED_OUTPATIENT_CLINIC_OR_DEPARTMENT_OTHER): Payer: Medicare Other

## 2014-03-20 VITALS — BP 168/106 | HR 98 | Temp 97.9°F

## 2014-03-20 DIAGNOSIS — Z5112 Encounter for antineoplastic immunotherapy: Secondary | ICD-10-CM

## 2014-03-20 DIAGNOSIS — C9002 Multiple myeloma in relapse: Secondary | ICD-10-CM

## 2014-03-20 DIAGNOSIS — C9000 Multiple myeloma not having achieved remission: Secondary | ICD-10-CM

## 2014-03-20 MED ORDER — ONDANSETRON 8 MG/50ML IVPB (CHCC)
8.0000 mg | Freq: Once | INTRAVENOUS | Status: AC
  Administered 2014-03-20: 8 mg via INTRAVENOUS

## 2014-03-20 MED ORDER — SODIUM CHLORIDE 0.9 % IV SOLN
Freq: Once | INTRAVENOUS | Status: AC
  Administered 2014-03-20: 09:00:00 via INTRAVENOUS

## 2014-03-20 MED ORDER — SODIUM CHLORIDE 0.9 % IJ SOLN
10.0000 mL | INTRAMUSCULAR | Status: DC | PRN
  Administered 2014-03-20: 10 mL
  Filled 2014-03-20: qty 10

## 2014-03-20 MED ORDER — HEPARIN SOD (PORK) LOCK FLUSH 100 UNIT/ML IV SOLN
500.0000 [IU] | Freq: Once | INTRAVENOUS | Status: AC | PRN
  Administered 2014-03-20: 500 [IU]
  Filled 2014-03-20: qty 5

## 2014-03-20 MED ORDER — ONDANSETRON 8 MG/NS 50 ML IVPB
INTRAVENOUS | Status: AC
  Filled 2014-03-20: qty 8

## 2014-03-20 MED ORDER — DEXAMETHASONE SODIUM PHOSPHATE 10 MG/ML IJ SOLN
INTRAMUSCULAR | Status: AC
  Filled 2014-03-20: qty 1

## 2014-03-20 MED ORDER — DEXAMETHASONE SODIUM PHOSPHATE 10 MG/ML IJ SOLN
10.0000 mg | Freq: Once | INTRAMUSCULAR | Status: AC
  Administered 2014-03-20: 10 mg via INTRAVENOUS

## 2014-03-20 MED ORDER — DEXTROSE 5 % IV SOLN
27.0000 mg/m2 | Freq: Once | INTRAVENOUS | Status: AC
  Administered 2014-03-20: 44 mg via INTRAVENOUS
  Filled 2014-03-20: qty 22

## 2014-03-20 NOTE — Patient Instructions (Signed)
[facility] Discharge Inst[facility] for Patients Receiving Chemotherapy  Today you received the following chemotherapy agents Kyprolis To help prevent nausea and vomiting after your treatment, we encourage you to take your nausea medication as prescribed.  If you develop nausea and vomiting that is not controlled by your nausea medication, call the clinic.   BELOW ARE SYMPTOMS THAT SHOULD BE REPORTED IMMEDIATELY:  *FEVER GREATER THAN 100.5 F  *CHILLS WITH OR WITHOUT FEVER  NAUSEA AND VOMITING THAT IS NOT CONTROLLED WITH YOUR NAUSEA MEDICATION  *UNUSUAL SHORTNESS OF BREATH  *UNUSUAL BRUISING OR BLEEDING  TENDERNESS IN MOUTH AND THROAT WITH OR WITHOUT PRESENCE OF ULCERS  *URINARY PROBLEMS  *BOWEL PROBLEMS  UNUSUAL RASH Items with * indicate a potential emergency and should be followed up as soon as possible.  Feel free to call the clinic you have any questions or concerns. The clinic phone number is [phone].    

## 2014-03-21 LAB — SPEP & IFE WITH QIG
ALPHA-2-GLOBULIN: 12.4 % — AB (ref 7.1–11.8)
Albumin ELP: 61.9 % (ref 55.8–66.1)
Alpha-1-Globulin: 9.3 % — ABNORMAL HIGH (ref 2.9–4.9)
BETA GLOBULIN: 6.6 % (ref 4.7–7.2)
Beta 2: 2 % — ABNORMAL LOW (ref 3.2–6.5)
GAMMA GLOBULIN: 7.8 % — AB (ref 11.1–18.8)
IGG (IMMUNOGLOBIN G), SERUM: 621 mg/dL — AB (ref 690–1700)
IgA: 26 mg/dL — ABNORMAL LOW (ref 69–380)
IgM, Serum: 58 mg/dL (ref 52–322)
Total Protein, Serum Electrophoresis: 6.8 g/dL (ref 6.0–8.3)

## 2014-03-21 LAB — KAPPA/LAMBDA LIGHT CHAINS
Kappa free light chain: 8.39 mg/dL — ABNORMAL HIGH (ref 0.33–1.94)
Kappa:Lambda Ratio: 10.11 — ABNORMAL HIGH (ref 0.26–1.65)
Lambda Free Lght Chn: 0.83 mg/dL (ref 0.57–2.63)

## 2014-03-29 ENCOUNTER — Telehealth: Payer: Self-pay | Admitting: *Deleted

## 2014-03-29 ENCOUNTER — Other Ambulatory Visit (HOSPITAL_BASED_OUTPATIENT_CLINIC_OR_DEPARTMENT_OTHER): Payer: Medicare Other

## 2014-03-29 ENCOUNTER — Ambulatory Visit (HOSPITAL_BASED_OUTPATIENT_CLINIC_OR_DEPARTMENT_OTHER): Payer: Medicare Other

## 2014-03-29 ENCOUNTER — Other Ambulatory Visit: Payer: Self-pay | Admitting: *Deleted

## 2014-03-29 VITALS — BP 95/66 | HR 88 | Temp 97.8°F | Resp 18 | Ht 64.0 in

## 2014-03-29 DIAGNOSIS — C9000 Multiple myeloma not having achieved remission: Secondary | ICD-10-CM

## 2014-03-29 DIAGNOSIS — Z5112 Encounter for antineoplastic immunotherapy: Secondary | ICD-10-CM

## 2014-03-29 LAB — CBC WITH DIFFERENTIAL/PLATELET
BASO%: 0.5 % (ref 0.0–2.0)
BASOS ABS: 0 10*3/uL (ref 0.0–0.1)
EOS%: 1.6 % (ref 0.0–7.0)
Eosinophils Absolute: 0.1 10*3/uL (ref 0.0–0.5)
HEMATOCRIT: 41.9 % (ref 34.8–46.6)
HEMOGLOBIN: 13.9 g/dL (ref 11.6–15.9)
LYMPH#: 1.2 10*3/uL (ref 0.9–3.3)
LYMPH%: 21.9 % (ref 14.0–49.7)
MCH: 32.6 pg (ref 25.1–34.0)
MCHC: 33.2 g/dL (ref 31.5–36.0)
MCV: 98.1 fL (ref 79.5–101.0)
MONO#: 0.6 10*3/uL (ref 0.1–0.9)
MONO%: 10.6 % (ref 0.0–14.0)
NEUT#: 3.7 10*3/uL (ref 1.5–6.5)
NEUT%: 65.4 % (ref 38.4–76.8)
Platelets: 125 10*3/uL — ABNORMAL LOW (ref 145–400)
RBC: 4.27 10*6/uL (ref 3.70–5.45)
RDW: 13.7 % (ref 11.2–14.5)
WBC: 5.7 10*3/uL (ref 3.9–10.3)

## 2014-03-29 LAB — BASIC METABOLIC PANEL (CC13)
ANION GAP: 11 meq/L (ref 3–11)
BUN: 33.1 mg/dL — ABNORMAL HIGH (ref 7.0–26.0)
CO2: 25 mEq/L (ref 22–29)
Calcium: 9.7 mg/dL (ref 8.4–10.4)
Chloride: 102 mEq/L (ref 98–109)
Creatinine: 1.2 mg/dL — ABNORMAL HIGH (ref 0.6–1.1)
GLUCOSE: 166 mg/dL — AB (ref 70–140)
POTASSIUM: 4.8 meq/L (ref 3.5–5.1)
Sodium: 137 mEq/L (ref 136–145)

## 2014-03-29 MED ORDER — DEXTROSE 5 % IV SOLN
27.0000 mg/m2 | Freq: Once | INTRAVENOUS | Status: AC
  Administered 2014-03-29: 44 mg via INTRAVENOUS
  Filled 2014-03-29: qty 22

## 2014-03-29 MED ORDER — DEXAMETHASONE SODIUM PHOSPHATE 10 MG/ML IJ SOLN
INTRAMUSCULAR | Status: AC
  Filled 2014-03-29: qty 1

## 2014-03-29 MED ORDER — MAGIC MOUTHWASH: 10.0000 mL | Freq: Four times a day (QID) | ORAL | Status: DC | PRN

## 2014-03-29 MED ORDER — HEPARIN SOD (PORK) LOCK FLUSH 100 UNIT/ML IV SOLN
500.0000 [IU] | Freq: Once | INTRAVENOUS | Status: AC | PRN
  Administered 2014-03-29: 500 [IU]
  Filled 2014-03-29: qty 5

## 2014-03-29 MED ORDER — ONDANSETRON 8 MG/50ML IVPB (CHCC)
8.0000 mg | Freq: Once | INTRAVENOUS | Status: AC
  Administered 2014-03-29: 8 mg via INTRAVENOUS

## 2014-03-29 MED ORDER — DEXAMETHASONE SODIUM PHOSPHATE 10 MG/ML IJ SOLN
10.0000 mg | Freq: Once | INTRAMUSCULAR | Status: AC
  Administered 2014-03-29: 10 mg via INTRAVENOUS

## 2014-03-29 MED ORDER — SODIUM CHLORIDE 0.9 % IJ SOLN
10.0000 mL | INTRAMUSCULAR | Status: DC | PRN
Start: 2014-03-29 — End: 2014-03-29
  Administered 2014-03-29: 10 mL
  Filled 2014-03-29: qty 10

## 2014-03-29 MED ORDER — ONDANSETRON 8 MG/NS 50 ML IVPB
INTRAVENOUS | Status: AC
  Filled 2014-03-29: qty 8

## 2014-03-29 MED ORDER — SODIUM CHLORIDE 0.9 % IV SOLN
Freq: Once | INTRAVENOUS | Status: AC
  Administered 2014-03-29: 15:00:00 via INTRAVENOUS

## 2014-03-29 NOTE — Telephone Encounter (Signed)
Message copied by Cathlean Cower on Thu Mar 29, 2014  3:07 PM ------      Message from: [facility] [place], [place]      Created: Thu Mar 29, 2014  2:41 PM      Regarding: elevated creatinine       She is dehydrated, recommend increased oral fluids, recheck next week      ----- Message -----         From: [facility]         Sent: 03/29/2014   2:06 PM           To: Heath Lark, MD                   ------

## 2014-03-29 NOTE — Progress Notes (Signed)
Dr. Alvy Bimler reviewed all lab results.  OK to treat as per md.  Instructed pt to  HOLD  Lisinopril starting today until further notice as per md. Pt stated starting last Wed. 03/22/14, pt experienced mouth discomfort  Lasting 2 and a half days.  Denied mouth sores, denied white coating on tongue, just redness and slight pain, denied bleeding.  Pt was able to eat some but not much, and still able to drink fine.  Pt stated symptom resolved some today.   Dr. Alvy Bimler also notified of pt's mouth symptom.  Called in prescription for Magic Mouthwash to pt's pharmacy as ok per md.  Explanations given to pt, and pt verbalized understanding. Offered pt extra normal saline today with treatment due to low blood pressure.  Pt refused and stated she would go home and drink orally.

## 2014-03-29 NOTE — Telephone Encounter (Signed)
S/w pt in infusion,  Instructed to drink at least 2 to liters water daily. She verbalized understanding and will try.

## 2014-03-29 NOTE — Patient Instructions (Addendum)
[facility] Discharge Instructions for Patients Receiving Chemotherapy  Today you received the following chemotherapy agents :  Kyprolis.  To help prevent nausea and vomiting after your treatment, we encourage you to take your nausea medication as prescribed.  Instructed pt to  HOLD  LISINOPRIL starting today until further notice.  Instructed pt to increase po fluids as per md due to low blood pressure and abnormal lab chemistries.    If you develop nausea and vomiting that is not controlled by your nausea medication, call the clinic.   BELOW ARE SYMPTOMS THAT SHOULD BE REPORTED IMMEDIATELY:  *FEVER GREATER THAN 100.5 F  *CHILLS WITH OR WITHOUT FEVER  NAUSEA AND VOMITING THAT IS NOT CONTROLLED WITH YOUR NAUSEA MEDICATION  *UNUSUAL SHORTNESS OF BREATH  *UNUSUAL BRUISING OR BLEEDING  TENDERNESS IN MOUTH AND THROAT WITH OR WITHOUT PRESENCE OF ULCERS  *URINARY PROBLEMS  *BOWEL PROBLEMS  UNUSUAL RASH Items with * indicate a potential emergency and should be followed up as soon as possible.  Feel free to call the clinic you have any questions or concerns. The clinic phone number is [phone].

## 2014-03-30 ENCOUNTER — Ambulatory Visit (HOSPITAL_BASED_OUTPATIENT_CLINIC_OR_DEPARTMENT_OTHER): Payer: Medicare Other

## 2014-03-30 VITALS — BP 121/84 | HR 79 | Temp 97.7°F | Resp 18

## 2014-03-30 DIAGNOSIS — C9000 Multiple myeloma not having achieved remission: Secondary | ICD-10-CM

## 2014-03-30 DIAGNOSIS — Z5112 Encounter for antineoplastic immunotherapy: Secondary | ICD-10-CM

## 2014-03-30 MED ORDER — SODIUM CHLORIDE 0.9 % IJ SOLN
10.0000 mL | INTRAMUSCULAR | Status: DC | PRN
  Administered 2014-03-30: 10 mL
  Filled 2014-03-30: qty 10

## 2014-03-30 MED ORDER — ONDANSETRON 8 MG/50ML IVPB (CHCC)
8.0000 mg | Freq: Once | INTRAVENOUS | Status: AC
  Administered 2014-03-30: 8 mg via INTRAVENOUS

## 2014-03-30 MED ORDER — SODIUM CHLORIDE 0.9 % IV SOLN
Freq: Once | INTRAVENOUS | Status: AC
  Administered 2014-03-30: 12:00:00 via INTRAVENOUS

## 2014-03-30 MED ORDER — DEXAMETHASONE SODIUM PHOSPHATE 10 MG/ML IJ SOLN
10.0000 mg | Freq: Once | INTRAMUSCULAR | Status: AC
  Administered 2014-03-30: 10 mg via INTRAVENOUS

## 2014-03-30 MED ORDER — HEPARIN SOD (PORK) LOCK FLUSH 100 UNIT/ML IV SOLN
500.0000 [IU] | Freq: Once | INTRAVENOUS | Status: AC | PRN
  Administered 2014-03-30: 500 [IU]
  Filled 2014-03-30: qty 5

## 2014-03-30 MED ORDER — ONDANSETRON 8 MG/NS 50 ML IVPB
INTRAVENOUS | Status: AC
  Filled 2014-03-30: qty 8

## 2014-03-30 MED ORDER — DEXAMETHASONE SODIUM PHOSPHATE 10 MG/ML IJ SOLN
INTRAMUSCULAR | Status: AC
  Filled 2014-03-30: qty 1

## 2014-03-30 MED ORDER — DEXTROSE 5 % IV SOLN
27.0000 mg/m2 | Freq: Once | INTRAVENOUS | Status: AC
  Administered 2014-03-30: 44 mg via INTRAVENOUS
  Filled 2014-03-30: qty 22

## 2014-03-30 NOTE — Patient Instructions (Signed)
[facility] Discharge Instructions for Patients Receiving Chemotherapy  Today you received the following chemotherapy agents Kyprolis.  To help prevent nausea and vomiting after your treatment, we encourage you to take your nausea medication.   If you develop nausea and vomiting that is not controlled by your nausea medication, call the clinic.   BELOW ARE SYMPTOMS THAT SHOULD BE REPORTED IMMEDIATELY:  *FEVER GREATER THAN 100.5 F  *CHILLS WITH OR WITHOUT FEVER  NAUSEA AND VOMITING THAT IS NOT CONTROLLED WITH YOUR NAUSEA MEDICATION  *UNUSUAL SHORTNESS OF BREATH  *UNUSUAL BRUISING OR BLEEDING  TENDERNESS IN MOUTH AND THROAT WITH OR WITHOUT PRESENCE OF ULCERS  *URINARY PROBLEMS  *BOWEL PROBLEMS  UNUSUAL RASH Items with * indicate a potential emergency and should be followed up as soon as possible.  Feel free to call the clinic you have any questions or concerns. The clinic phone number is [phone].    

## 2014-04-05 ENCOUNTER — Ambulatory Visit: Payer: Medicare Other

## 2014-04-05 ENCOUNTER — Ambulatory Visit (HOSPITAL_BASED_OUTPATIENT_CLINIC_OR_DEPARTMENT_OTHER): Payer: Medicare Other

## 2014-04-05 ENCOUNTER — Other Ambulatory Visit (HOSPITAL_BASED_OUTPATIENT_CLINIC_OR_DEPARTMENT_OTHER): Payer: Medicare Other

## 2014-04-05 ENCOUNTER — Other Ambulatory Visit: Payer: Self-pay | Admitting: Hematology and Oncology

## 2014-04-05 VITALS — BP 129/88 | HR 84 | Temp 96.9°F

## 2014-04-05 DIAGNOSIS — Z95828 Presence of other vascular implants and grafts: Secondary | ICD-10-CM

## 2014-04-05 DIAGNOSIS — C9000 Multiple myeloma not having achieved remission: Secondary | ICD-10-CM

## 2014-04-05 DIAGNOSIS — Z5112 Encounter for antineoplastic immunotherapy: Secondary | ICD-10-CM

## 2014-04-05 LAB — CBC WITH DIFFERENTIAL/PLATELET
BASO%: 0.5 % (ref 0.0–2.0)
Basophils Absolute: 0 10*3/uL (ref 0.0–0.1)
EOS ABS: 0.2 10*3/uL (ref 0.0–0.5)
EOS%: 2.4 % (ref 0.0–7.0)
HCT: 46.4 % (ref 34.8–46.6)
HGB: 15.5 g/dL (ref 11.6–15.9)
LYMPH%: 16.3 % (ref 14.0–49.7)
MCH: 32.7 pg (ref 25.1–34.0)
MCHC: 33.3 g/dL (ref 31.5–36.0)
MCV: 98 fL (ref 79.5–101.0)
MONO#: 0.8 10*3/uL (ref 0.1–0.9)
MONO%: 11.5 % (ref 0.0–14.0)
NEUT#: 4.7 10*3/uL (ref 1.5–6.5)
NEUT%: 69.3 % (ref 38.4–76.8)
Platelets: 146 10*3/uL (ref 145–400)
RBC: 4.73 10*6/uL (ref 3.70–5.45)
RDW: 14.3 % (ref 11.2–14.5)
WBC: 6.8 10*3/uL (ref 3.9–10.3)
lymph#: 1.1 10*3/uL (ref 0.9–3.3)

## 2014-04-05 LAB — BASIC METABOLIC PANEL (CC13)
Anion Gap: 13 mEq/L — ABNORMAL HIGH (ref 3–11)
BUN: 19.3 mg/dL (ref 7.0–26.0)
CALCIUM: 9.3 mg/dL (ref 8.4–10.4)
CO2: 19 meq/L — AB (ref 22–29)
CREATININE: 0.9 mg/dL (ref 0.6–1.1)
Chloride: 102 mEq/L (ref 98–109)
Glucose: 157 mg/dl — ABNORMAL HIGH (ref 70–140)
Potassium: 3.4 mEq/L — ABNORMAL LOW (ref 3.5–5.1)
Sodium: 134 mEq/L — ABNORMAL LOW (ref 136–145)

## 2014-04-05 MED ORDER — DEXAMETHASONE SODIUM PHOSPHATE 10 MG/ML IJ SOLN
INTRAMUSCULAR | Status: AC
  Filled 2014-04-05: qty 1

## 2014-04-05 MED ORDER — SODIUM CHLORIDE 0.9 % IJ SOLN
10.0000 mL | INTRAMUSCULAR | Status: DC | PRN
  Administered 2014-04-05 (×2): 10 mL via INTRAVENOUS
  Filled 2014-04-05: qty 10

## 2014-04-05 MED ORDER — ONDANSETRON 8 MG/50ML IVPB (CHCC)
8.0000 mg | Freq: Once | INTRAVENOUS | Status: AC
  Administered 2014-04-05: 8 mg via INTRAVENOUS

## 2014-04-05 MED ORDER — DEXTROSE 5 % IV SOLN
27.0000 mg/m2 | Freq: Once | INTRAVENOUS | Status: AC
  Administered 2014-04-05: 44 mg via INTRAVENOUS
  Filled 2014-04-05: qty 22

## 2014-04-05 MED ORDER — HEPARIN SOD (PORK) LOCK FLUSH 100 UNIT/ML IV SOLN
500.0000 [IU] | Freq: Once | INTRAVENOUS | Status: AC | PRN
  Administered 2014-04-05: 500 [IU]
  Filled 2014-04-05: qty 5

## 2014-04-05 MED ORDER — SODIUM CHLORIDE 0.9 % IJ SOLN
10.0000 mL | INTRAMUSCULAR | Status: DC | PRN
  Filled 2014-04-05: qty 10

## 2014-04-05 MED ORDER — SODIUM CHLORIDE 0.9 % IV SOLN
Freq: Once | INTRAVENOUS | Status: AC
  Administered 2014-04-05: 13:00:00 via INTRAVENOUS

## 2014-04-05 MED ORDER — ONDANSETRON 8 MG/NS 50 ML IVPB
INTRAVENOUS | Status: AC
  Filled 2014-04-05: qty 8

## 2014-04-05 MED ORDER — DEXAMETHASONE SODIUM PHOSPHATE 10 MG/ML IJ SOLN
10.0000 mg | Freq: Once | INTRAMUSCULAR | Status: AC
  Administered 2014-04-05: 10 mg via INTRAVENOUS

## 2014-04-05 NOTE — Patient Instructions (Signed)

## 2014-04-05 NOTE — Patient Instructions (Signed)
[facility] Discharge Instructions for Patients Receiving Chemotherapy  Today you received the following chemotherapy agents kyprolis  To help prevent nausea and vomiting after your treatment, we encourage you to take your nausea medication as directed   If you develop nausea and vomiting that is not controlled by your nausea medication, call the clinic.   BELOW ARE SYMPTOMS THAT SHOULD BE REPORTED IMMEDIATELY:  *FEVER GREATER THAN 100.5 F  *CHILLS WITH OR WITHOUT FEVER  NAUSEA AND VOMITING THAT IS NOT CONTROLLED WITH YOUR NAUSEA MEDICATION  *UNUSUAL SHORTNESS OF BREATH  *UNUSUAL BRUISING OR BLEEDING  TENDERNESS IN MOUTH AND THROAT WITH OR WITHOUT PRESENCE OF ULCERS  *URINARY PROBLEMS  *BOWEL PROBLEMS  UNUSUAL RASH Items with * indicate a potential emergency and should be followed up as soon as possible.  Feel free to call the clinic you have any questions or concerns. The clinic phone number is [phone].  

## 2014-04-06 ENCOUNTER — Ambulatory Visit (HOSPITAL_BASED_OUTPATIENT_CLINIC_OR_DEPARTMENT_OTHER): Payer: Medicare Other

## 2014-04-06 VITALS — BP 118/77 | HR 82 | Temp 98.0°F | Resp 20

## 2014-04-06 DIAGNOSIS — C9000 Multiple myeloma not having achieved remission: Secondary | ICD-10-CM

## 2014-04-06 DIAGNOSIS — Z5112 Encounter for antineoplastic immunotherapy: Secondary | ICD-10-CM

## 2014-04-06 MED ORDER — DEXAMETHASONE SODIUM PHOSPHATE 10 MG/ML IJ SOLN
INTRAMUSCULAR | Status: AC
  Filled 2014-04-06: qty 1

## 2014-04-06 MED ORDER — SODIUM CHLORIDE 0.9 % IV SOLN
Freq: Once | INTRAVENOUS | Status: AC
  Administered 2014-04-06: 12:00:00 via INTRAVENOUS

## 2014-04-06 MED ORDER — SODIUM CHLORIDE 0.9 % IJ SOLN
10.0000 mL | INTRAMUSCULAR | Status: DC | PRN
  Administered 2014-04-06: 10 mL
  Filled 2014-04-06: qty 10

## 2014-04-06 MED ORDER — HEPARIN SOD (PORK) LOCK FLUSH 100 UNIT/ML IV SOLN
500.0000 [IU] | Freq: Once | INTRAVENOUS | Status: AC | PRN
  Administered 2014-04-06: 500 [IU]
  Filled 2014-04-06: qty 5

## 2014-04-06 MED ORDER — DEXAMETHASONE SODIUM PHOSPHATE 10 MG/ML IJ SOLN
10.0000 mg | Freq: Once | INTRAMUSCULAR | Status: AC
Start: 2014-04-06 — End: 2014-04-06
  Administered 2014-04-06: 10 mg via INTRAVENOUS

## 2014-04-06 MED ORDER — ONDANSETRON 8 MG/50ML IVPB (CHCC)
8.0000 mg | Freq: Once | INTRAVENOUS | Status: AC
Start: 2014-04-06 — End: 2014-04-06
  Administered 2014-04-06: 8 mg via INTRAVENOUS

## 2014-04-06 MED ORDER — DEXTROSE 5 % IV SOLN
27.0000 mg/m2 | Freq: Once | INTRAVENOUS | Status: AC
  Administered 2014-04-06: 44 mg via INTRAVENOUS
  Filled 2014-04-06: qty 22

## 2014-04-06 MED ORDER — ONDANSETRON 8 MG/NS 50 ML IVPB
INTRAVENOUS | Status: AC
  Filled 2014-04-06: qty 8

## 2014-04-06 NOTE — Patient Instructions (Signed)
[facility] Discharge Instructions for Patients Receiving Chemotherapy  Today you received the following chemotherapy agents: Kyprolis  To help prevent nausea and vomiting after your treatment, we encourage you to take your nausea medication: as directed.   If you develop nausea and vomiting that is not controlled by your nausea medication, call the clinic.   BELOW ARE SYMPTOMS THAT SHOULD BE REPORTED IMMEDIATELY:  *FEVER GREATER THAN 100.5 F  *CHILLS WITH OR WITHOUT FEVER  NAUSEA AND VOMITING THAT IS NOT CONTROLLED WITH YOUR NAUSEA MEDICATION  *UNUSUAL SHORTNESS OF BREATH  *UNUSUAL BRUISING OR BLEEDING  TENDERNESS IN MOUTH AND THROAT WITH OR WITHOUT PRESENCE OF ULCERS  *URINARY PROBLEMS  *BOWEL PROBLEMS  UNUSUAL RASH Items with * indicate a potential emergency and should be followed up as soon as possible.  Feel free to call the clinic you have any questions or concerns. The clinic phone number is [phone].    

## 2014-04-17 ENCOUNTER — Other Ambulatory Visit: Payer: Self-pay

## 2014-04-17 DIAGNOSIS — I10 Essential (primary) hypertension: Secondary | ICD-10-CM

## 2014-04-17 MED ORDER — HYDROCHLOROTHIAZIDE 25 MG PO TABS: 25.0000 mg | ORAL_TABLET | Freq: Every day | ORAL | Status: DC

## 2014-04-20 ENCOUNTER — Other Ambulatory Visit: Payer: Self-pay | Admitting: *Deleted

## 2014-04-23 ENCOUNTER — Other Ambulatory Visit: Payer: Self-pay | Admitting: *Deleted

## 2014-04-23 ENCOUNTER — Other Ambulatory Visit (HOSPITAL_BASED_OUTPATIENT_CLINIC_OR_DEPARTMENT_OTHER): Payer: Medicare Other

## 2014-04-23 ENCOUNTER — Telehealth: Payer: Self-pay | Admitting: *Deleted

## 2014-04-23 ENCOUNTER — Encounter: Payer: Self-pay | Admitting: Hematology and Oncology

## 2014-04-23 ENCOUNTER — Telehealth: Payer: Self-pay | Admitting: Hematology and Oncology

## 2014-04-23 ENCOUNTER — Ambulatory Visit: Payer: Self-pay

## 2014-04-23 ENCOUNTER — Ambulatory Visit (HOSPITAL_BASED_OUTPATIENT_CLINIC_OR_DEPARTMENT_OTHER): Payer: Medicare Other | Admitting: Hematology and Oncology

## 2014-04-23 VITALS — BP 154/97 | HR 73 | Temp 98.3°F | Resp 18 | Ht 64.0 in | Wt 125.8 lb

## 2014-04-23 DIAGNOSIS — B182 Chronic viral hepatitis C: Secondary | ICD-10-CM

## 2014-04-23 DIAGNOSIS — M8710 Osteonecrosis due to drugs, unspecified bone: Secondary | ICD-10-CM

## 2014-04-23 DIAGNOSIS — M8708 Idiopathic aseptic necrosis of bone, other site: Secondary | ICD-10-CM

## 2014-04-23 DIAGNOSIS — C9000 Multiple myeloma not having achieved remission: Secondary | ICD-10-CM

## 2014-04-23 DIAGNOSIS — F17200 Nicotine dependence, unspecified, uncomplicated: Secondary | ICD-10-CM

## 2014-04-23 DIAGNOSIS — R7989 Other specified abnormal findings of blood chemistry: Secondary | ICD-10-CM

## 2014-04-23 DIAGNOSIS — R945 Abnormal results of liver function studies: Secondary | ICD-10-CM

## 2014-04-23 LAB — COMPREHENSIVE METABOLIC PANEL (CC13)
ALBUMIN: 4.2 g/dL (ref 3.5–5.0)
ALK PHOS: 83 U/L (ref 40–150)
ALT: 221 U/L — ABNORMAL HIGH (ref 0–55)
AST: 179 U/L (ref 5–34)
Anion Gap: 9 mEq/L (ref 3–11)
BILIRUBIN TOTAL: 0.86 mg/dL (ref 0.20–1.20)
BUN: 15.5 mg/dL (ref 7.0–26.0)
CO2: 27 mEq/L (ref 22–29)
Calcium: 10.2 mg/dL (ref 8.4–10.4)
Chloride: 103 mEq/L (ref 98–109)
Creatinine: 0.9 mg/dL (ref 0.6–1.1)
Glucose: 126 mg/dl (ref 70–140)
Potassium: 3.7 mEq/L (ref 3.5–5.1)
Sodium: 139 mEq/L (ref 136–145)
Total Protein: 7 g/dL (ref 6.4–8.3)

## 2014-04-23 LAB — CBC WITH DIFFERENTIAL/PLATELET
BASO%: 0.8 % (ref 0.0–2.0)
BASOS ABS: 0.1 10*3/uL (ref 0.0–0.1)
EOS%: 0.5 % (ref 0.0–7.0)
Eosinophils Absolute: 0 10*3/uL (ref 0.0–0.5)
HCT: 45.6 % (ref 34.8–46.6)
HEMOGLOBIN: 15.2 g/dL (ref 11.6–15.9)
LYMPH%: 8.1 % — AB (ref 14.0–49.7)
MCH: 33.1 pg (ref 25.1–34.0)
MCHC: 33.2 g/dL (ref 31.5–36.0)
MCV: 99.7 fL (ref 79.5–101.0)
MONO#: 0.2 10*3/uL (ref 0.1–0.9)
MONO%: 2.9 % (ref 0.0–14.0)
NEUT%: 87.7 % — ABNORMAL HIGH (ref 38.4–76.8)
NEUTROS ABS: 6.4 10*3/uL (ref 1.5–6.5)
Platelets: 166 10*3/uL (ref 145–400)
RBC: 4.58 10*6/uL (ref 3.70–5.45)
RDW: 14.9 % — ABNORMAL HIGH (ref 11.2–14.5)
WBC: 7.3 10*3/uL (ref 3.9–10.3)
lymph#: 0.6 10*3/uL — ABNORMAL LOW (ref 0.9–3.3)

## 2014-04-23 NOTE — Telephone Encounter (Signed)
gv and printed appt sched and avs for tp fro Sept...sed added tx.

## 2014-04-23 NOTE — Assessment & Plan Note (Signed)
The patient complained of fatigue and desire a chemotherapy break. The most recent light chains are improving significantly and the patient is currently in VGPR. I do not think this is unreasonable to give her chemotherapy break. 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

## 2014-04-23 NOTE — Progress Notes (Signed)
[facility] OFFICE PROGRESS NOTE  Patient Care Team: Lindley Magnus, MD as PCP - General Eddie Candle, MD as Consulting Physician (Medical Oncology)  SUMMARY OF ONCOLOGIC HISTORY: Oncology History   Multiple myeloma, kappa light chain disease, Durie-Salmon stage III     MULTIPLE  MYELOMA   07/16/2006 Bone Marrow Biopsy BM biopsy is non-diagnostic   09/21/2006 Procedure L5 vertebral biopsy 5% plasma cell   10/25/2006 Bone Marrow Biopsy BM biopsy is hypercellular with 5% plasma cell   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   01/03/2007 -  Radiation Therapy Approximate date only, received RT for plasmacytoma followed by surgery   09/14/2007 Initial Diagnosis MULTIPLE  MYELOMA   10/05/2007 Bone Marrow Biopsy Bm biopsy was negative   06/18/2008 Bone Marrow Biopsy BM biopsy was negative   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 [facility]   04/13/2011 Relapse/Recurrence Disease relapse   04/14/2011 Bone Marrow Biopsy Bm biopsy showed 2 % plasma cell   04/27/2011 Relapse/Recurrence Disease relapse, treated with Velcade/Cytoxan/Dex   09/07/2011 - 07/28/2013 Chemotherapy She has been receiving Velcade   12/27/2011 Bone Marrow Transplant 2nd transplant at [facility]   07/28/2013 Relapse/Recurrence Chemo is stopped due to progression of disease   08/29/2013 Imaging PEt/CT showed recurrence of disease with new lesion on her rib with compression fracture   09/13/2013 Bone Marrow Biopsy BM biopsy is hypercellular with 6% plasma cell   10/10/2013 - 10/20/2013 Radiation Therapy Started on palliative XRT for rib pain   11/27/2013 -  Chemotherapy The patient starts chemotherapy with Carfilzomib    INTERVAL HISTORY: Please see below for problem oriented charting. 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 She continued to smoke and complained of shortness breath on minimal exertion.  REVIEW OF SYSTEMS:   Constitutional: Denies fevers, chills or  abnormal weight loss Eyes: Denies blurriness of vision Ears, nose, mouth, throat, and face: Denies mucositis or sore throat Cardiovascular: Denies palpitation, chest discomfort or lower extremity swelling Gastrointestinal:  Denies nausea, heartburn or change in bowel habits Skin: Denies abnormal skin rashes Lymphatics: Denies new lymphadenopathy or easy bruising Neurological:Denies numbness, tingling or new weaknesses Behavioral/Psych: Mood is stable, no new changes  All other systems were reviewed with the patient and are negative.  I have reviewed the past medical history, past surgical history, social history and family history with the patient and they are unchanged from previous note.  ALLERGIES:  is allergic to codeine and ibuprofen.  MEDICATIONS:  Current Outpatient Prescriptions  Medication Sig Dispense Refill  . acyclovir (ZOVIRAX) 400 MG tablet Take 1 tablet (400 mg total) by mouth daily.  30 tablet  3  . albuterol (PROVENTIL HFA;VENTOLIN HFA) 108 (90 BASE) MCG/ACT inhaler Inhale 2 puffs into the lungs every 4 (four) hours as needed for wheezing or shortness of breath.  1 Inhaler  0  . Alum & Mag Hydroxide-Simeth (MAGIC MOUTHWASH) SOLN Take 10 mLs by mouth 4 (four) times daily as needed for mouth pain. Swish and spit  Or  Swish and swallow.  300 mL  2  . Cholecalciferol (VITAMIN D-3) 5000 UNITS TABS Take 1 tablet by mouth every morning.      . hydrochlorothiazide (HYDRODIURIL) 25 MG tablet Take 1 tablet (25 mg total) by mouth daily.  90 tablet  0  . lidocaine-prilocaine (EMLA) cream Apply 1 application topically as needed.  30 g  0  . lisinopril (PRINIVIL,ZESTRIL) 40 MG tablet Take 1 tablet (40 mg total) by mouth  daily.  90 tablet  3  . magnesium hydroxide (MILK OF MAGNESIA) 400 MG/5ML suspension Take 30 mLs by mouth daily as needed for mild constipation.      Marland Kitchen morphine (MS CONTIN) 100 MG 12 hr tablet Take 100 mg by mouth 3 (three) times daily.       Marland Kitchen morphine (MS CONTIN) 30 MG  12 hr tablet Take 30 mg by mouth every 12 (twelve) hours.      . predniSONE (DELTASONE) 20 MG tablet 2 tabs x 3 days, 1 tab x 3 days, 1/2 tab x 3 days, 1/2 tab M,W,F x 2 weeks  40 tablet  1  . prochlorperazine (COMPAZINE) 10 MG tablet Take 1 tablet (10 mg total) by mouth every 6 (six) hours as needed for nausea.  60 tablet  3  . [DISCONTINUED] amLODipine (NORVASC) 5 MG tablet Take 1 tablet (5 mg total) by mouth daily.  90 tablet  3  . [DISCONTINUED] gabapentin (NEURONTIN) 600 MG tablet Take 600 mg by mouth 3 (three) times daily.         No current facility-administered medications for this visit.   Facility-Administered Medications Ordered in Other Visits  Medication Dose Route Frequency Provider Last Rate Last Dose  . sodium chloride 0.9 % injection 10 mL  10 mL Intravenous PRN Artis Delay, MD   10 mL at 02/26/14 1425  . sodium chloride 0.9 % injection 10 mL  10 mL Intravenous PRN Lowella Dell, MD   10 mL at 04/05/14 1333    PHYSICAL EXAMINATION: ECOG PERFORMANCE STATUS: 1 - Symptomatic but completely ambulatory  Filed Vitals:   04/23/14 1209  BP: 154/97  Pulse: 73  Temp: 98.3 F (36.8 C)  Resp: 18   Filed Weights   04/23/14 1209  Weight: 125 lb 12.8 oz (57.063 kg)    GENERAL:alert, no distress and comfortable SKIN: skin color, texture, turgor are normal, no rashes or significant lesions EYES: normal, Conjunctiva are pink and non-injected, sclera clear OROPHARYNX:no exudate, no erythema and lips, buccal mucosa, and tongue normal . Poor dentition is noted NECK: supple, thyroid normal size, non-tender, without nodularity LYMPH:  no palpable lymphadenopathy in the cervical, axillary or inguinal LUNGS: clear to auscultation and percussion with normal breathing effort HEART: regular rate & rhythm and no murmurs and no lower extremity edema ABDOMEN:abdomen soft, non-tender and normal bowel sounds Musculoskeletal:no cyanosis of digits and no clubbing  NEURO: alert & oriented x 3  with fluent speech, no focal motor/sensory deficits  LABORATORY DATA:  I have reviewed the data as listed    Component Value Date/Time   NA 139 04/23/2014 1154   NA 138 10/06/2013 1920   K 3.7 04/23/2014 1154   K 4.3 10/06/2013 1920   CL 103 10/06/2013 1920   CL 107 03/10/2013 1014   CO2 27 04/23/2014 1154   CO2 25 10/06/2013 1920   GLUCOSE 126 04/23/2014 1154   GLUCOSE 99 10/06/2013 1920   GLUCOSE 111* 03/10/2013 1014   GLUCOSE 98 08/31/2006 1024   BUN 15.5 04/23/2014 1154   BUN 10 10/06/2013 1920   CREATININE 0.9 04/23/2014 1154   CREATININE 0.63 10/06/2013 1920   CALCIUM 10.2 04/23/2014 1154   CALCIUM 9.8 10/06/2013 1920   PROT 7.0 04/23/2014 1154   PROT 7.2 10/06/2013 1920   ALBUMIN 4.2 04/23/2014 1154   ALBUMIN 3.7 10/06/2013 1920   AST 179* 04/23/2014 1154   AST 16 10/06/2013 1920   ALT 221* 04/23/2014 1154   ALT 11  10/06/2013 1920   ALKPHOS 83 04/23/2014 1154   ALKPHOS 100 10/06/2013 1920   BILITOT 0.86 04/23/2014 1154   BILITOT 0.3 10/06/2013 1920   GFRNONAA >90 10/06/2013 1920   GFRAA >90 10/06/2013 1920    No results found for this basename: SPEP, UPEP,  kappa and lambda light chains    Lab Results  Component Value Date   WBC 7.3 04/23/2014   NEUTROABS 6.4 04/23/2014   HGB 15.2 04/23/2014   HCT 45.6 04/23/2014   MCV 99.7 04/23/2014   PLT 166 04/23/2014      Chemistry      Component Value Date/Time   NA 139 04/23/2014 1154   NA 138 10/06/2013 1920   K 3.7 04/23/2014 1154   K 4.3 10/06/2013 1920   CL 103 10/06/2013 1920   CL 107 03/10/2013 1014   CO2 27 04/23/2014 1154   CO2 25 10/06/2013 1920   BUN 15.5 04/23/2014 1154   BUN 10 10/06/2013 1920   CREATININE 0.9 04/23/2014 1154   CREATININE 0.63 10/06/2013 1920      Component Value Date/Time   CALCIUM 10.2 04/23/2014 1154   CALCIUM 9.8 10/06/2013 1920   ALKPHOS 83 04/23/2014 1154   ALKPHOS 100 10/06/2013 1920   AST 179* 04/23/2014 1154   AST 16 10/06/2013 1920   ALT 221* 04/23/2014 1154   ALT 11 10/06/2013 1920   BILITOT 0.86 04/23/2014 1154    BILITOT 0.3 10/06/2013 1920       ASSESSMENT & PLAN:  MULTIPLE  MYELOMA The patient complained of fatigue and desire a chemotherapy break. The most recent light chains are improving significantly and the patient is currently in VGPR. I do not think this is unreasonable to give her chemotherapy break. 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  TOBACCO ABUSE I spent some time counseling the patient the importance of tobacco cessation. she is currently attempting to quit on her own   Abnormal LFTs This is likely related to recent chemotherapy. She also has history of hepatitis C and I wonder if they have been reactivation of hepatitis C infection. I will recheck it next week. In the meantime I recommend the patient to refrain from alcohol intake and to avoid anesthesia.  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 I will continue to hold intravenous bisphosphonates.   Orders Placed This Encounter  Procedures  . Comprehensive metabolic panel    Standing Status: Standing     Number of Occurrences: 22     Standing Expiration Date: 04/24/2015  . SPEP & IFE with QIG    Standing Status: Future     Number of Occurrences:      Standing Expiration Date: 04/23/2015  . Kappa/lambda light chains    Standing Status: Future     Number of Occurrences:      Standing Expiration Date: 04/23/2015  . Hepatitis C antibody    Standing Status: Future     Number of Occurrences:      Standing Expiration Date: 04/23/2015   All questions were answered. The patient knows to call the clinic with any problems, questions or concerns. No barriers to learning was detected.    [facility], [place], MD 04/23/2014 2:20 PM

## 2014-04-23 NOTE — Assessment & Plan Note (Addendum)
This is likely related to recent chemotherapy. She also has history of hepatitis C and I wonder if they have been reactivation of hepatitis C infection. I will recheck it next week. In the meantime I recommend the patient to refrain from alcohol intake and to avoid anesthesia.

## 2014-04-23 NOTE — Assessment & Plan Note (Signed)
The patient needs to put her chemotherapy on hold and to undergo full dental evaluation and treatment for osteonecrosis of the jaw. I will continue to hold intravenous bisphosphonates.

## 2014-04-23 NOTE — Telephone Encounter (Signed)
AST elevated. Pt notified to stop any alcohol, do not go under anesthesia and return for labs in 1 week. Pt verbalized understanding

## 2014-04-23 NOTE — Assessment & Plan Note (Signed)
I spent some time counseling the patient the importance of tobacco cessation. she is currently attempting to quit on her own 

## 2014-04-24 ENCOUNTER — Ambulatory Visit: Payer: Self-pay

## 2014-04-25 LAB — SPEP & IFE WITH QIG
Albumin ELP: 65.1 % (ref 55.8–66.1)
Alpha-1-Globulin: 5.2 % — ABNORMAL HIGH (ref 2.9–4.9)
Alpha-2-Globulin: 12.9 % — ABNORMAL HIGH (ref 7.1–11.8)
BETA GLOBULIN: 6.7 % (ref 4.7–7.2)
Beta 2: 2.2 % — ABNORMAL LOW (ref 3.2–6.5)
Gamma Globulin: 7.9 % — ABNORMAL LOW (ref 11.1–18.8)
IGA: 27 mg/dL — AB (ref 69–380)
IGG (IMMUNOGLOBIN G), SERUM: 651 mg/dL — AB (ref 690–1700)
IGM, SERUM: 60 mg/dL (ref 52–322)
TOTAL PROTEIN, SERUM ELECTROPHOR: 7 g/dL (ref 6.0–8.3)

## 2014-04-25 LAB — KAPPA/LAMBDA LIGHT CHAINS
Kappa free light chain: 10 mg/dL — ABNORMAL HIGH (ref 0.33–1.94)
Kappa:Lambda Ratio: 11.36 — ABNORMAL HIGH (ref 0.26–1.65)
LAMBDA FREE LGHT CHN: 0.88 mg/dL (ref 0.57–2.63)

## 2014-05-16 ENCOUNTER — Ambulatory Visit (INDEPENDENT_AMBULATORY_CARE_PROVIDER_SITE_OTHER): Payer: Medicare Other | Admitting: Internal Medicine

## 2014-05-16 ENCOUNTER — Ambulatory Visit (INDEPENDENT_AMBULATORY_CARE_PROVIDER_SITE_OTHER)
Admission: RE | Admit: 2014-05-16 | Discharge: 2014-05-16 | Disposition: A | Discharge status: Home / Self Care | Payer: Medicare Other | Source: Ambulatory Visit | Attending: Internal Medicine | Admitting: Internal Medicine

## 2014-05-16 ENCOUNTER — Encounter: Payer: Self-pay | Admitting: Internal Medicine

## 2014-05-16 VITALS — BP 152/90 | HR 83 | Temp 98.4°F | Ht 64.0 in | Wt 130.6 lb

## 2014-05-16 DIAGNOSIS — I10 Essential (primary) hypertension: Secondary | ICD-10-CM

## 2014-05-16 DIAGNOSIS — R0602 Shortness of breath: Secondary | ICD-10-CM

## 2014-05-16 DIAGNOSIS — F17200 Nicotine dependence, unspecified, uncomplicated: Secondary | ICD-10-CM

## 2014-05-16 NOTE — Progress Notes (Signed)
Subjective:    Patient ID: Kendra Johns, female    DOB: 1951/02/25  MRN: [id number]  HPI   19 yowf active smoker with MM s/p stem cell 2009 and 2013 with recurrence requiring chemo since 2013 but not working so RT to   lower ribs both sides Dec 2014  started new treatment of Jan 2015 and monthly until Jun 5th  p doe which started in April, worse  By end of May  ? Better since late May (? p d/c ACEi?)with last rx June 5th referred by Dr Sherren Mocha for eval of sob.  From chart: 10/10/2013 - 10/20/2013  Radiation Therapy  Started on palliative XRT for rib pain  11/27/2013  Carfilzomib d/c 04/06/14 last dose   05/16/2014 1st [facility] office visit/ Sharad Vaneaton  Chief Complaint  Patient presents with  . Pulmonary Consult    Referred per Dr. Sherren Mocha. Pt c/o SOB for since started on chemo in March 2015.  She states that she is SOB with or without exertion.  She gets out of breath with walking approx 100 ft.    whereas at one point could not do the aisle at [facility] and now can . No cough since off ACEi  Not using saba now but seemed to help cough and congestion when she was at her worst. Not sure symbicort helped No purulent or excessive mucus  No obvious other patterns in day to day or daytime variabilty or assoc  cp or chest tightness, subjective wheeze overt sinus or hb symptoms. No unusual exp hx or h/o childhood pna/ asthma or knowledge of premature birth.  Sleeping ok without nocturnal  or early am exacerbation  of respiratory  c/o's or need for noct saba. Also denies any obvious fluctuation of symptoms with weather or environmental changes or other aggravating or alleviating factors except as outlined above   Current Medications, Allergies, Complete Past Medical History, Past Surgical History, Family History, and Social History were reviewed in [employer] record.                Review of Systems  Constitutional: Negative for fever, chills and unexpected weight  change.  HENT: Positive for congestion and dental problem. Negative for ear pain, nosebleeds, postnasal drip, rhinorrhea, sinus pressure, sneezing, sore throat, trouble swallowing and voice change.   Eyes: Negative for visual disturbance.  Respiratory: Positive for shortness of breath. Negative for cough and choking.   Cardiovascular: Negative for chest pain and leg swelling.  Gastrointestinal: Negative for vomiting, abdominal pain and diarrhea.  Genitourinary: Negative for difficulty urinating.  Musculoskeletal: Positive for arthralgias.  Skin: Negative for rash.  Neurological: Negative for tremors, syncope and headaches.  Hematological: Does not bruise/bleed easily.       Objective:   Physical Exam  Wt Readings from Last 3 Encounters:  05/16/14 130 lb 9.6 oz (59.24 kg)  04/23/14 125 lb 12.8 oz (57.063 kg)  03/19/14 131 lb 1.6 oz (59.467 kg)      HEENT mild turbinate edema.  Oropharynx no thrush or excess pnd or cobblestoning.  No JVD or cervical adenopathy. Mild accessory muscle hypertrophy. Trachea midline, nl thryroid. Chest was hyperinflated by percussion with diminished breath sounds and moderate increased exp time without wheeze. Hoover sign positive at mid inspiration. Regular rate and rhythm without murmur gallop or rub or increase P2 or edema.  Abd: no hsm, nl excursion. Ext warm without cyanosis or clubbing.       cxr 02/13/14 No evidence of  acute cardiopulmonary disease. Anterior fifth and  sixth rib fractures on the left, age indeterminate. Multiple healed  bilateral rib fractures. The patient has a history of multiple  myeloma and these are likely pathologic fractures   CXR  05/16/2014 :  copd/ no acute changes  Assessment & Plan:

## 2014-05-16 NOTE — Patient Instructions (Addendum)
Please remember to go to the x-ray department downstairs for your tests - we will call you with the results when they are available.  The key is to stop smoking completely before smoking completely stops you!      Please schedule a follow up office visit in 6 weeks, call sooner if needed - do not restart the lisinopril in meantime as there are plenty of other choices if your blood pressure rises again - see me or Dr Sherren Mocha if needed in meantime Late add : pfts scheduled for return

## 2014-05-17 NOTE — Assessment & Plan Note (Addendum)
>   3 min discussion I reviewed the Flethcher curve with patient that basically indicates  if you quit smoking when your best day FEV1 is still well preserved (as appears to be the case here, pfts pending)  it is highly unlikely you will progress to severe disease and informed the patient there was no medication on the market that has proven to change the curve or the likelihood of progression.  Therefore stopping smoking and maintaining abstinence is the most important aspect of care, not choice of inhalers or for that matter, doctors.

## 2014-05-17 NOTE — Assessment & Plan Note (Signed)
Pt d/c acei ? May 2015 on her own > cough and breathing improved by time of pulmonary eval 05/17/2014   bp is marginally elevated, defer rx to Dr Sherren Mocha but would avoid acei here in favor or arb

## 2014-05-17 NOTE — Assessment & Plan Note (Signed)
05/14/2014  Walked RA x 3 laps @ 185 ft each stopped due to end of study, no sob   Symptoms are markedly disproportionate to objective findings and not clear this is a lung problem but pt does appear to have difficult airway management issues. DDX of  difficult airways management all start with A and  include Adherence, Ace Inhibitors, Acid Reflux, Active Sinus Disease, Alpha 1 Antitripsin deficiency, Anxiety masquerading as Airways dz,  ABPA,  allergy(esp in young), Aspiration (esp in elderly), Adverse effects of DPI,  Active smokers, plus two Bs  = Bronchiectasis and Beta blocker use..and one C= CHF   Adherence is always the initial "prime suspect" and is a multilayered concern that requires a "trust but verify" approach in every patient - starting with knowing how to use medications, especially inhalers, correctly, keeping up with refills and understanding the fundamental difference between maintenance and prns vs those medications only taken for a very short course and then stopped and not refilled.  - The proper method of use, as well as anticipated side effects, of a metered-dose inhaler are discussed and demonstrated to the patient. Improved effectiveness after extensive coaching during this visit to a level of approximately  75% so ok to use saba prn, no need for symbicort at this point  Active smoking greatest concern  > see sep a/p  ? acei effect > better since off, would avoid in future so as not to muddy the water in terms of interpretation of nonspecific symptoms  ? Adverse effect of meds, ? Chemo related > not clear she has ILD but needs PFT baseline as carlizomid reported to cause sob.   See instructions for specific recommendations which were reviewed directly with the patient who was given a copy with highlighter outlining the key components.

## 2014-05-17 NOTE — Progress Notes (Signed)
Quick Note:  Spoke with pt and notified of results per Dr. Wert. Pt verbalized understanding and denied any questions.  ______ 

## 2014-05-23 ENCOUNTER — Telehealth: Payer: Self-pay | Admitting: Hematology and Oncology

## 2014-05-23 ENCOUNTER — Other Ambulatory Visit: Payer: Self-pay | Admitting: Hematology and Oncology

## 2014-05-23 ENCOUNTER — Telehealth: Payer: Self-pay | Admitting: *Deleted

## 2014-05-23 ENCOUNTER — Other Ambulatory Visit: Payer: Self-pay | Admitting: *Deleted

## 2014-05-23 DIAGNOSIS — C9000 Multiple myeloma not having achieved remission: Secondary | ICD-10-CM

## 2014-05-23 NOTE — Telephone Encounter (Signed)
Called pt at home and left message on voice mail re:  Per Dr. Alvy Bimler, pt will have lab and xray done tomorrow 05/24/14.  Pt will have office visit on 05/30/14.   Left message that a scheduler will contact pt with appt.

## 2014-05-23 NOTE — Telephone Encounter (Signed)
Pt called and stated she would like to have an appt with Dr. Alvy Bimler soon.  Pt stated she has been experiencing severe back pain for last 3 weeks.  Pt had been working in the yard 3 weeks ago and thought she might have overworked.  However, pt would like to be checked out before going out of town first week in August.  Pt stated she had stopped chemo for 2-3 months now. Pt's  Phone   [phone].

## 2014-05-23 NOTE — Telephone Encounter (Signed)
s/w pt re lb/xray 7/23 and f/u 7/29.

## 2014-05-24 ENCOUNTER — Telehealth: Payer: Self-pay | Admitting: *Deleted

## 2014-05-24 ENCOUNTER — Other Ambulatory Visit (HOSPITAL_BASED_OUTPATIENT_CLINIC_OR_DEPARTMENT_OTHER): Payer: Medicare Other

## 2014-05-24 ENCOUNTER — Ambulatory Visit (HOSPITAL_COMMUNITY)
Admission: RE | Admit: 2014-05-24 | Discharge: 2014-05-24 | Disposition: A | Discharge status: Home / Self Care | Payer: Medicare Other | Source: Ambulatory Visit | Attending: Hematology and Oncology | Admitting: Hematology and Oncology

## 2014-05-24 DIAGNOSIS — M899 Disorder of bone, unspecified: Secondary | ICD-10-CM | POA: Insufficient documentation

## 2014-05-24 DIAGNOSIS — M949 Disorder of cartilage, unspecified: Secondary | ICD-10-CM

## 2014-05-24 DIAGNOSIS — B182 Chronic viral hepatitis C: Secondary | ICD-10-CM

## 2014-05-24 DIAGNOSIS — C9000 Multiple myeloma not having achieved remission: Secondary | ICD-10-CM

## 2014-05-24 DIAGNOSIS — M545 Low back pain, unspecified: Secondary | ICD-10-CM | POA: Insufficient documentation

## 2014-05-24 DIAGNOSIS — R945 Abnormal results of liver function studies: Secondary | ICD-10-CM

## 2014-05-24 DIAGNOSIS — M47812 Spondylosis without myelopathy or radiculopathy, cervical region: Secondary | ICD-10-CM | POA: Insufficient documentation

## 2014-05-24 DIAGNOSIS — R7989 Other specified abnormal findings of blood chemistry: Secondary | ICD-10-CM

## 2014-05-24 DIAGNOSIS — C9002 Multiple myeloma in relapse: Secondary | ICD-10-CM

## 2014-05-24 LAB — CBC WITH DIFFERENTIAL/PLATELET
BASO%: 1 % (ref 0.0–2.0)
Basophils Absolute: 0.1 10*3/uL (ref 0.0–0.1)
EOS ABS: 0.1 10*3/uL (ref 0.0–0.5)
EOS%: 1 % (ref 0.0–7.0)
HEMATOCRIT: 49.2 % — AB (ref 34.8–46.6)
HGB: 16.1 g/dL — ABNORMAL HIGH (ref 11.6–15.9)
LYMPH%: 18.3 % (ref 14.0–49.7)
MCH: 32.2 pg (ref 25.1–34.0)
MCHC: 32.8 g/dL (ref 31.5–36.0)
MCV: 97.9 fL (ref 79.5–101.0)
MONO#: 0.6 10*3/uL (ref 0.1–0.9)
MONO%: 7.7 % (ref 0.0–14.0)
NEUT#: 6 10*3/uL (ref 1.5–6.5)
NEUT%: 72 % (ref 38.4–76.8)
PLATELETS: 220 10*3/uL (ref 145–400)
RBC: 5.02 10*6/uL (ref 3.70–5.45)
RDW: 14.1 % (ref 11.2–14.5)
WBC: 8.3 10*3/uL (ref 3.9–10.3)
lymph#: 1.5 10*3/uL (ref 0.9–3.3)

## 2014-05-24 LAB — COMPREHENSIVE METABOLIC PANEL (CC13)
ALK PHOS: 92 U/L (ref 40–150)
ALT: 87 U/L — ABNORMAL HIGH (ref 0–55)
AST: 75 U/L — AB (ref 5–34)
Albumin: 4.2 g/dL (ref 3.5–5.0)
Anion Gap: 11 mEq/L (ref 3–11)
BUN: 14 mg/dL (ref 7.0–26.0)
CALCIUM: 10.2 mg/dL (ref 8.4–10.4)
CHLORIDE: 103 meq/L (ref 98–109)
CO2: 26 mEq/L (ref 22–29)
CREATININE: 0.8 mg/dL (ref 0.6–1.1)
Glucose: 120 mg/dl (ref 70–140)
Potassium: 3 mEq/L — CL (ref 3.5–5.1)
Sodium: 140 mEq/L (ref 136–145)
Total Bilirubin: 0.78 mg/dL (ref 0.20–1.20)
Total Protein: 7.4 g/dL (ref 6.4–8.3)

## 2014-05-24 MED ORDER — POTASSIUM CHLORIDE CRYS ER 20 MEQ PO TBCR: 20.0000 meq | EXTENDED_RELEASE_TABLET | Freq: Two times a day (BID) | ORAL | Status: DC

## 2014-05-24 NOTE — Telephone Encounter (Signed)
Message copied by Cathlean Cower on Thu May 24, 2014  1:25 PM ------      Message from: [facility], [place]      Created: Thu May 24, 2014  1:10 PM      Regarding: low K       Please tell her to stop HCTZ.      Call in [place] 20 meq BID x 3 days       ----- Message -----         From: [facility]         Sent: 05/24/2014  12:31 PM           To: Heath Lark, MD                   ------

## 2014-05-24 NOTE — Telephone Encounter (Signed)
Informed pt of low Potassium level and order to take Potassium twice daily for 3 days.  Rx sent to [facility].  Also discussed some potassium rich foods for pt to try.  She verbalized understanding.

## 2014-05-28 LAB — KAPPA/LAMBDA LIGHT CHAINS
KAPPA LAMBDA RATIO: 21.69 — AB (ref 0.26–1.65)
Kappa free light chain: 15.4 mg/dL — ABNORMAL HIGH (ref 0.33–1.94)
LAMBDA FREE LGHT CHN: 0.71 mg/dL (ref 0.57–2.63)

## 2014-05-28 LAB — SPEP & IFE WITH QIG
ALBUMIN ELP: 63.4 % (ref 55.8–66.1)
ALPHA-2-GLOBULIN: 13.4 % — AB (ref 7.1–11.8)
Alpha-1-Globulin: 5.3 % — ABNORMAL HIGH (ref 2.9–4.9)
Beta 2: 2.9 % — ABNORMAL LOW (ref 3.2–6.5)
Beta Globulin: 6.6 % (ref 4.7–7.2)
Gamma Globulin: 8.4 % — ABNORMAL LOW (ref 11.1–18.8)
IGA: 32 mg/dL — AB (ref 69–380)
IgG (Immunoglobin G), Serum: 667 mg/dL — ABNORMAL LOW (ref 690–1700)
IgM, Serum: 68 mg/dL (ref 52–322)
TOTAL PROTEIN, SERUM ELECTROPHOR: 7 g/dL (ref 6.0–8.3)

## 2014-05-28 LAB — HEPATITIS C ANTIBODY: HCV Ab: REACTIVE — AB

## 2014-05-30 ENCOUNTER — Encounter: Payer: Self-pay | Admitting: Hematology and Oncology

## 2014-05-30 ENCOUNTER — Ambulatory Visit (HOSPITAL_BASED_OUTPATIENT_CLINIC_OR_DEPARTMENT_OTHER): Payer: Medicare Other | Admitting: Hematology and Oncology

## 2014-05-30 ENCOUNTER — Telehealth: Payer: Self-pay | Admitting: *Deleted

## 2014-05-30 VITALS — BP 141/93 | HR 83 | Temp 98.0°F | Resp 20 | Ht 64.0 in | Wt 125.7 lb

## 2014-05-30 DIAGNOSIS — M5430 Sciatica, unspecified side: Secondary | ICD-10-CM

## 2014-05-30 DIAGNOSIS — R7989 Other specified abnormal findings of blood chemistry: Secondary | ICD-10-CM

## 2014-05-30 DIAGNOSIS — R945 Abnormal results of liver function studies: Secondary | ICD-10-CM

## 2014-05-30 DIAGNOSIS — F17200 Nicotine dependence, unspecified, uncomplicated: Secondary | ICD-10-CM

## 2014-05-30 DIAGNOSIS — C9000 Multiple myeloma not having achieved remission: Secondary | ICD-10-CM

## 2014-05-30 DIAGNOSIS — M5441 Lumbago with sciatica, right side: Secondary | ICD-10-CM

## 2014-05-30 DIAGNOSIS — M8710 Osteonecrosis due to drugs, unspecified bone: Secondary | ICD-10-CM

## 2014-05-30 DIAGNOSIS — M8700 Idiopathic aseptic necrosis of unspecified bone: Secondary | ICD-10-CM

## 2014-05-30 DIAGNOSIS — M5442 Lumbago with sciatica, left side: Secondary | ICD-10-CM

## 2014-05-30 NOTE — Progress Notes (Signed)
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   01/03/2007 -  Radiation Therapy Approximate date only, received RT for plasmacytoma followed by surgery   09/14/2007 Initial Diagnosis MULTIPLE  MYELOMA   10/05/2007 Bone Marrow Biopsy Bm biopsy was negative   06/18/2008 Bone Marrow Biopsy BM biopsy was negative   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 [facility]   04/13/2011 Relapse/Recurrence Disease relapse   04/14/2011 Bone Marrow Biopsy Bm biopsy showed 2 % plasma cell   04/27/2011 Relapse/Recurrence Disease relapse, treated with Velcade/Cytoxan/Dex   09/07/2011 - 07/28/2013 Chemotherapy She has been receiving Velcade   12/27/2011 Bone Marrow Transplant 2nd transplant at [facility]   07/28/2013 Relapse/Recurrence Chemo is stopped due to progression of disease   08/29/2013 Imaging PEt/CT showed recurrence of disease with new lesion on her rib with compression fracture   09/13/2013 Bone Marrow Biopsy BM biopsy is hypercellular with 6% plasma cell   10/10/2013 - 10/20/2013 Radiation Therapy Started on palliative XRT for rib pain   11/27/2013 -  Chemotherapy The patient starts chemotherapy with Carfilzomib    INTERVAL HISTORY: Please see below for problem oriented charting. She requested urgent visit due to mild worsening back pain. The pain is in the midline, lower back, radiating bilaterally to legs. She denies any neurological deficit. Recently, she strained the muscle by working  in her yard. Since then, it is mildly improving. She denies any new fracture. Denies recent infection. She continues to have persistent shortness of breath, unchanged compared to her baseline.  REVIEW OF SYSTEMS:   Constitutional: Denies fevers, chills or abnormal weight loss Eyes: Denies blurriness of vision Ears, nose, mouth, throat, and face: Denies mucositis or sore throat Respiratory: Denies cough, dyspnea or wheezes Cardiovascular: Denies palpitation, chest discomfort or lower extremity swelling Gastrointestinal:  Denies nausea, heartburn or change in bowel habits Skin: Denies abnormal skin rashes Lymphatics: Denies new lymphadenopathy or easy bruising Neurological:Denies numbness, tingling or new weaknesses Behavioral/Psych: Mood is stable, no new changes  All other systems were reviewed with the patient and are negative.  I have reviewed the past medical history, past surgical history, social history and family history with the patient and they are unchanged from previous note.  ALLERGIES:  is allergic to codeine and ibuprofen.  MEDICATIONS:  Current Outpatient Prescriptions  Medication Sig Dispense Refill  . acyclovir (ZOVIRAX) 400 MG tablet Take 1 tablet (400 mg total) by mouth daily.  30 tablet  3  . albuterol (PROVENTIL HFA;VENTOLIN HFA) 108 (90 BASE) MCG/ACT inhaler Inhale 2 puffs into the lungs every 4 (four) hours as needed for wheezing or shortness of breath.  1 Inhaler  0  . Alum & Mag Hydroxide-Simeth (MAGIC MOUTHWASH) SOLN Take 10 mLs by mouth 4 (four) times daily as needed for mouth pain. Swish and spit  Or  Swish and swallow.  300 mL  2  . Cholecalciferol (VITAMIN D-3) 5000 UNITS TABS Take 1 tablet by mouth every morning.      . hydrochlorothiazide (HYDRODIURIL) 25  MG tablet Take 1 tablet (25 mg total) by mouth daily.  90 tablet  0  . lidocaine-prilocaine (EMLA) cream Apply 1 application topically as needed.  30 g  0  . magnesium hydroxide (MILK OF MAGNESIA) 400  MG/5ML suspension Take 30 mLs by mouth daily as needed for mild constipation.      Marland Kitchen morphine (MS CONTIN) 60 MG 12 hr tablet Take 60 mg by mouth every 12 (twelve) hours.      Marland Kitchen morphine (MSIR) 15 MG tablet Take 15 mg by mouth every 6 (six) hours as needed for severe pain.      Marland Kitchen prochlorperazine (COMPAZINE) 10 MG tablet Take 1 tablet (10 mg total) by mouth every 6 (six) hours as needed for nausea.  60 tablet  3  . [DISCONTINUED] amLODipine (NORVASC) 5 MG tablet Take 1 tablet (5 mg total) by mouth daily.  90 tablet  3  . [DISCONTINUED] gabapentin (NEURONTIN) 600 MG tablet Take 600 mg by mouth 3 (three) times daily.         No current facility-administered medications for this visit.   Facility-Administered Medications Ordered in Other Visits  Medication Dose Route Frequency Provider Last Rate Last Dose  . sodium chloride 0.9 % injection 10 mL  10 mL Intravenous PRN Heath Lark, MD   10 mL at 02/26/14 1425  . sodium chloride 0.9 % injection 10 mL  10 mL Intravenous PRN Chauncey Cruel, MD   10 mL at 04/05/14 1333    PHYSICAL EXAMINATION: ECOG PERFORMANCE STATUS: 1 - Symptomatic but completely ambulatory  Filed Vitals:   05/30/14 1153  BP: 141/93  Pulse: 83  Temp: 98 F (36.7 C)  Resp: 20   Filed Weights   05/30/14 1153  Weight: 125 lb 11.2 oz (57.017 kg)    GENERAL:alert, no distress and comfortable SKIN: skin color, texture, turgor are normal, no rashes or significant lesions EYES: normal, Conjunctiva are pink and non-injected, sclera clear OROPHARYNX:no exudate, no erythema and lips, buccal mucosa, and tongue normal . Poor dentition is noted NECK: supple, thyroid normal size, non-tender, without nodularity LYMPH:  no palpable lymphadenopathy in the cervical, axillary or inguinal LUNGS: clear to auscultation and percussion with normal breathing effort HEART: regular rate & rhythm and no murmurs and no lower extremity edema ABDOMEN:abdomen soft, non-tender and normal bowel  sounds Musculoskeletal:no cyanosis of digits and no clubbing  NEURO: alert & oriented x 3 with fluent speech, no focal motor/sensory deficits  LABORATORY DATA:  I have reviewed the data as listed    Component Value Date/Time   NA 140 05/24/2014 1216   NA 138 10/06/2013 1920   K 3.0* 05/24/2014 1216   K 4.3 10/06/2013 1920   CL 103 10/06/2013 1920   CL 107 03/10/2013 1014   CO2 26 05/24/2014 1216   CO2 25 10/06/2013 1920   GLUCOSE 120 05/24/2014 1216   GLUCOSE 99 10/06/2013 1920   GLUCOSE 111* 03/10/2013 1014   GLUCOSE 98 08/31/2006 1024   BUN 14.0 05/24/2014 1216   BUN 10 10/06/2013 1920   CREATININE 0.8 05/24/2014 1216   CREATININE 0.63 10/06/2013 1920   CALCIUM 10.2 05/24/2014 1216   CALCIUM 9.8 10/06/2013 1920   PROT 7.4 05/24/2014 1216   PROT 7.2 10/06/2013 1920   ALBUMIN 4.2 05/24/2014 1216   ALBUMIN 3.7 10/06/2013 1920   AST 75* 05/24/2014 1216   AST 16 10/06/2013 1920   ALT 87* 05/24/2014 1216   ALT 11 10/06/2013 1920   ALKPHOS 92  05/24/2014 1216   ALKPHOS 100 10/06/2013 1920   BILITOT 0.78 05/24/2014 1216   BILITOT 0.3 10/06/2013 1920   GFRNONAA >90 10/06/2013 1920   GFRAA >90 10/06/2013 1920    No results found for this basename: SPEP, UPEP,  kappa and lambda light chains    Lab Results  Component Value Date   WBC 8.3 05/24/2014   NEUTROABS 6.0 05/24/2014   HGB 16.1* 05/24/2014   HCT 49.2* 05/24/2014   MCV 97.9 05/24/2014   PLT 220 05/24/2014      Chemistry      Component Value Date/Time   NA 140 05/24/2014 1216   NA 138 10/06/2013 1920   K 3.0* 05/24/2014 1216   K 4.3 10/06/2013 1920   CL 103 10/06/2013 1920   CL 107 03/10/2013 1014   CO2 26 05/24/2014 1216   CO2 25 10/06/2013 1920   BUN 14.0 05/24/2014 1216   BUN 10 10/06/2013 1920   CREATININE 0.8 05/24/2014 1216   CREATININE 0.63 10/06/2013 1920      Component Value Date/Time   CALCIUM 10.2 05/24/2014 1216   CALCIUM 9.8 10/06/2013 1920   ALKPHOS 92 05/24/2014 1216   ALKPHOS 100 10/06/2013 1920   AST 75* 05/24/2014 1216   AST 16  10/06/2013 1920   ALT 87* 05/24/2014 1216   ALT 11 10/06/2013 1920   BILITOT 0.78 05/24/2014 1216   BILITOT 0.3 10/06/2013 1920     ASSESSMENT & PLAN:  MULTIPLE  MYELOMA The patient complained of fatigue and is currently on chemotherapy break. The most recent light chains are improving significantly and the patient is currently in VGPR; however, I notice that the light chains are increasing. She understood that she will need to get her treatment restarted soon. 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    Smoker I spent some time counseling the patient the importance of tobacco cessation. she is currently attempting to quit on her own     Abnormal LFTs This is likely related to recent chemotherapy. She also has history of hepatitis C. In the meantime I recommend the patient to refrain from alcohol intake and to avoid anesthesia.    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 I will continue to hold intravenous bisphosphonates. She has a dental appointment pending next month.    Low back pain This is likely due to muscular strain and not necessarily due to myeloma. I recommend she continues to take her pain medicine. It is already improving.   No orders of the defined types were placed in this encounter.   All questions were answered. The patient knows to call the clinic with any problems, questions or concerns. No barriers to learning was detected. I spent 25 minutes counseling the patient face to face. The total time spent in the appointment was 30 minutes and more than 50% was on counseling and review of test results     [facility], [place], MD 05/30/2014 5:37 PM

## 2014-05-30 NOTE — Telephone Encounter (Signed)
Gave disability forms to [place], care management.

## 2014-05-30 NOTE — Assessment & Plan Note (Signed)
I spent some time counseling the patient the importance of tobacco cessation. she is currently attempting to quit on her own 

## 2014-05-30 NOTE — Assessment & Plan Note (Signed)
The patient complained of fatigue and is currently on chemotherapy break. The most recent light chains are improving significantly and the patient is currently in VGPR; however, I notice that the light chains are increasing. She understood that she will need to get her treatment restarted soon. 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

## 2014-05-30 NOTE — Assessment & Plan Note (Signed)
This is likely due to muscular strain and not necessarily due to myeloma. I recommend she continues to take her pain medicine. It is already improving.

## 2014-05-30 NOTE — Assessment & Plan Note (Signed)
The patient needs to put her chemotherapy on hold and to undergo full dental evaluation and treatment for osteonecrosis of the jaw. I will continue to hold intravenous bisphosphonates. She has a dental appointment pending next month.

## 2014-05-30 NOTE — Assessment & Plan Note (Signed)
This is likely related to recent chemotherapy. She also has history of hepatitis C. In the meantime I recommend the patient to refrain from alcohol intake and to avoid anesthesia.

## 2014-05-31 ENCOUNTER — Encounter: Payer: Self-pay | Admitting: Hematology and Oncology

## 2014-05-31 NOTE — Progress Notes (Signed)
Put disability form on nurse's desk. °

## 2014-06-07 ENCOUNTER — Encounter: Payer: Self-pay | Admitting: Hematology and Oncology

## 2014-06-07 NOTE — Progress Notes (Signed)
Faxed disability paper to [facility] @ [phone]

## 2014-06-26 ENCOUNTER — Other Ambulatory Visit: Payer: Self-pay | Admitting: Internal Medicine

## 2014-06-26 DIAGNOSIS — R0602 Shortness of breath: Secondary | ICD-10-CM

## 2014-06-27 ENCOUNTER — Ambulatory Visit: Payer: Self-pay | Admitting: Internal Medicine

## 2014-07-10 ENCOUNTER — Other Ambulatory Visit (HOSPITAL_BASED_OUTPATIENT_CLINIC_OR_DEPARTMENT_OTHER): Payer: Medicare Other

## 2014-07-10 ENCOUNTER — Telehealth: Payer: Self-pay | Admitting: Hematology and Oncology

## 2014-07-10 ENCOUNTER — Ambulatory Visit (HOSPITAL_BASED_OUTPATIENT_CLINIC_OR_DEPARTMENT_OTHER): Payer: Medicare Other | Admitting: Hematology and Oncology

## 2014-07-10 ENCOUNTER — Telehealth: Payer: Self-pay | Admitting: *Deleted

## 2014-07-10 ENCOUNTER — Encounter: Payer: Self-pay | Admitting: Hematology and Oncology

## 2014-07-10 VITALS — BP 170/103 | HR 120 | Wt 124.8 lb

## 2014-07-10 DIAGNOSIS — C9000 Multiple myeloma not having achieved remission: Secondary | ICD-10-CM

## 2014-07-10 DIAGNOSIS — M545 Low back pain, unspecified: Secondary | ICD-10-CM

## 2014-07-10 DIAGNOSIS — Z23 Encounter for immunization: Secondary | ICD-10-CM

## 2014-07-10 DIAGNOSIS — I10 Essential (primary) hypertension: Secondary | ICD-10-CM

## 2014-07-10 DIAGNOSIS — F17200 Nicotine dependence, unspecified, uncomplicated: Secondary | ICD-10-CM

## 2014-07-10 LAB — CBC WITH DIFFERENTIAL/PLATELET
BASO%: 0.3 % (ref 0.0–2.0)
Basophils Absolute: 0 10*3/uL (ref 0.0–0.1)
EOS%: 1 % (ref 0.0–7.0)
Eosinophils Absolute: 0.1 10*3/uL (ref 0.0–0.5)
HCT: 45.9 % (ref 34.8–46.6)
HGB: 15.4 g/dL (ref 11.6–15.9)
LYMPH%: 15.6 % (ref 14.0–49.7)
MCH: 31.5 pg (ref 25.1–34.0)
MCHC: 33.6 g/dL (ref 31.5–36.0)
MCV: 93.9 fL (ref 79.5–101.0)
MONO#: 0.6 10*3/uL (ref 0.1–0.9)
MONO%: 6.4 % (ref 0.0–14.0)
NEUT#: 6.8 10*3/uL — ABNORMAL HIGH (ref 1.5–6.5)
NEUT%: 76.7 % (ref 38.4–76.8)
Platelets: 173 10*3/uL (ref 145–400)
RBC: 4.89 10*6/uL (ref 3.70–5.45)
RDW: 13 % (ref 11.2–14.5)
WBC: 8.9 10*3/uL (ref 3.9–10.3)
lymph#: 1.4 10*3/uL (ref 0.9–3.3)

## 2014-07-10 LAB — COMPREHENSIVE METABOLIC PANEL (CC13)
ALBUMIN: 3.8 g/dL (ref 3.5–5.0)
ALT: 22 U/L (ref 0–55)
AST: 25 U/L (ref 5–34)
Alkaline Phosphatase: 77 U/L (ref 40–150)
Anion Gap: 9 mEq/L (ref 3–11)
BUN: 10.3 mg/dL (ref 7.0–26.0)
CALCIUM: 9.7 mg/dL (ref 8.4–10.4)
CHLORIDE: 108 meq/L (ref 98–109)
CO2: 22 meq/L (ref 22–29)
Creatinine: 0.8 mg/dL (ref 0.6–1.1)
Glucose: 104 mg/dl (ref 70–140)
Potassium: 3.6 mEq/L (ref 3.5–5.1)
SODIUM: 139 meq/L (ref 136–145)
TOTAL PROTEIN: 7 g/dL (ref 6.4–8.3)
Total Bilirubin: 0.32 mg/dL (ref 0.20–1.20)

## 2014-07-10 MED ORDER — INFLUENZA VAC SPLIT QUAD 0.5 ML IM SUSY
0.5000 mL | PREFILLED_SYRINGE | Freq: Once | INTRAMUSCULAR | Status: AC
  Administered 2014-07-10: 0.5 mL via INTRAMUSCULAR
  Filled 2014-07-10: qty 0.5

## 2014-07-10 MED ORDER — DEXAMETHASONE 4 MG PO TABS: 4.0000 mg | ORAL_TABLET | Freq: Every day | ORAL | Status: DC

## 2014-07-10 MED ORDER — LISINOPRIL 10 MG PO TABS: 10.0000 mg | ORAL_TABLET | Freq: Every day | ORAL | Status: DC

## 2014-07-10 NOTE — Progress Notes (Signed)
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   01/03/2007 -  Radiation Therapy Approximate date only, received RT for plasmacytoma followed by surgery   09/14/2007 Initial Diagnosis MULTIPLE  MYELOMA   10/05/2007 Bone Marrow Biopsy Bm biopsy was negative   06/18/2008 Bone Marrow Biopsy BM biopsy was negative   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 [facility]   04/13/2011 Relapse/Recurrence Disease relapse   04/14/2011 Bone Marrow Biopsy Bm biopsy showed 2 % plasma cell   04/27/2011 Relapse/Recurrence Disease relapse, treated with Velcade/Cytoxan/Dex   09/07/2011 - 07/28/2013 Chemotherapy She has been receiving Velcade   12/27/2011 Bone Marrow Transplant 2nd transplant at [facility]   07/28/2013 Relapse/Recurrence Chemo is stopped due to progression of disease   08/29/2013 Imaging PEt/CT showed recurrence of disease with new lesion on her rib with compression fracture   09/13/2013 Bone Marrow Biopsy BM biopsy is hypercellular with 6% plasma cell   10/10/2013 - 10/20/2013 Radiation Therapy Started on palliative XRT for rib pain   11/27/2013 -  Chemotherapy The patient starts chemotherapy with Carfilzomib    INTERVAL HISTORY: Please see below for problem oriented charting. Since the last time I saw her, she has worsening back pain for the last 3-4 weeks. Over the weekend, her pain has become severe. According to her, the pain is located in the region of the scapula bilaterally. She  had started to add lidocaine patch to those area received to help a little bit.  REVIEW OF SYSTEMS:   Constitutional: Denies fevers, chills or abnormal weight loss Eyes: Denies blurriness of vision Ears, nose, mouth, throat, and face: Denies mucositis or sore throat Respiratory: Denies cough, dyspnea or wheezes Cardiovascular: Denies palpitation, chest discomfort or lower extremity swelling Gastrointestinal:  Denies nausea, heartburn or change in bowel habits Skin: Denies abnormal skin rashes Lymphatics: Denies new lymphadenopathy or easy bruising Neurological:Denies numbness, tingling or new weaknesses Behavioral/Psych: Mood is stable, no new changes  All other systems were reviewed with the patient and are negative.  I have reviewed the past medical history, past surgical history, social history and family history with the patient and they are unchanged from previous note.  ALLERGIES:  is allergic to codeine and ibuprofen.  MEDICATIONS:  Current Outpatient Prescriptions  Medication Sig Dispense Refill  . acyclovir (ZOVIRAX) 400 MG tablet Take 1 tablet (400 mg total) by mouth daily.  30 tablet  3  . albuterol (PROVENTIL HFA;VENTOLIN HFA) 108 (90 BASE) MCG/ACT inhaler Inhale 2 puffs into the lungs every 4 (four) hours as needed for wheezing or shortness of breath.  1 Inhaler  0  . Alum & Mag Hydroxide-Simeth (MAGIC MOUTHWASH) SOLN Take 10 mLs by mouth 4 (four) times daily as needed for mouth pain. Swish and spit  Or  Swish and swallow.  300 mL  2  . Cholecalciferol (VITAMIN D-3) 5000 UNITS TABS Take 1 tablet by mouth every morning.      . lidocaine-prilocaine (EMLA) cream Apply 1 application topically as needed.  30 g  0  . magnesium hydroxide (MILK OF MAGNESIA) 400 MG/5ML suspension Take 30 mLs by mouth daily as needed for mild constipation.      Marland Kitchen morphine (MS CONTIN) 60 MG 12 hr tablet Take 60 mg by mouth every 12 (twelve) hours.      Marland Kitchen morphine (MSIR) 15 MG tablet Take 15 mg by mouth  every 6 (six) hours as needed for severe pain.      Marland Kitchen prochlorperazine (COMPAZINE) 10 MG tablet Take 1 tablet (10 mg total) by mouth every 6 (six) hours as needed for nausea.  60 tablet  3  . dexamethasone (DECADRON) 4 MG tablet Take 1 tablet (4 mg total) by mouth daily.  30 tablet  1  . lisinopril (PRINIVIL) 10 MG tablet Take 1 tablet (10 mg total) by mouth daily.  30 tablet  0  . [DISCONTINUED] amLODipine (NORVASC) 5 MG tablet Take 1 tablet (5 mg total) by mouth daily.  90 tablet  3  . [DISCONTINUED] gabapentin (NEURONTIN) 600 MG tablet Take 600 mg by mouth 3 (three) times daily.         No current facility-administered medications for this visit.   Facility-Administered Medications Ordered in Other Visits  Medication Dose Route Frequency Provider Last Rate Last Dose  . sodium chloride 0.9 % injection 10 mL  10 mL Intravenous PRN Artis Delay, MD   10 mL at 02/26/14 1425  . sodium chloride 0.9 % injection 10 mL  10 mL Intravenous PRN Lowella Dell, MD   10 mL at 04/05/14 1333    PHYSICAL EXAMINATION: ECOG PERFORMANCE STATUS: 1 - Symptomatic but completely ambulatory  Filed Vitals:   07/10/14 1248  BP: 170/103  Pulse: 120   Filed Weights   07/10/14 1248  Weight: 124 lb 12.8 oz (56.609 kg)    GENERAL:alert, no distress and comfortable. She looks thin but not cachectic SKIN: skin color, texture, turgor are normal, no rashes or significant lesions EYES: normal, Conjunctiva are pink and non-injected, sclera clear OROPHARYNX:no exudate, no erythema and lips, buccal mucosa, and tongue normal  NECK: supple, thyroid normal size, non-tender, without nodularity LYMPH:  no palpable lymphadenopathy in the cervical, axillary or inguinal LUNGS: clear to auscultation and percussion with normal breathing effort HEART: regular rate & rhythm and no murmurs and no lower extremity edema ABDOMEN:abdomen soft, non-tender and normal bowel sounds Musculoskeletal:no cyanosis of digits and no clubbing   NEURO: alert & oriented x 3 with fluent speech, no focal motor/sensory deficits  LABORATORY DATA:  I have reviewed the data as listed    Component Value Date/Time   NA 140 05/24/2014 1216   NA 138 10/06/2013 1920   K 3.0* 05/24/2014 1216   K 4.3 10/06/2013 1920   CL 103 10/06/2013 1920   CL 107 03/10/2013 1014   CO2 26 05/24/2014 1216   CO2 25 10/06/2013 1920   GLUCOSE 120 05/24/2014 1216   GLUCOSE 99 10/06/2013 1920   GLUCOSE 111* 03/10/2013 1014   GLUCOSE 98 08/31/2006 1024   BUN 14.0 05/24/2014 1216   BUN 10 10/06/2013 1920   CREATININE 0.8 05/24/2014 1216   CREATININE 0.63 10/06/2013 1920   CALCIUM 10.2 05/24/2014 1216   CALCIUM 9.8 10/06/2013 1920   PROT 7.4 05/24/2014 1216   PROT 7.2 10/06/2013 1920   ALBUMIN 4.2 05/24/2014 1216   ALBUMIN 3.7 10/06/2013 1920   AST 75* 05/24/2014 1216   AST 16 10/06/2013 1920   ALT 87* 05/24/2014 1216   ALT 11 10/06/2013 1920  ALKPHOS 92 05/24/2014 1216   ALKPHOS 100 10/06/2013 1920   BILITOT 0.78 05/24/2014 1216   BILITOT 0.3 10/06/2013 1920   GFRNONAA >90 10/06/2013 1920   GFRAA >90 10/06/2013 1920    No results found for this basename: SPEP, UPEP,  kappa and lambda light chains    Lab Results  Component Value Date   WBC 8.9 07/10/2014   NEUTROABS 6.8* 07/10/2014   HGB 15.4 07/10/2014   HCT 45.9 07/10/2014   MCV 93.9 07/10/2014   PLT 173 07/10/2014      Chemistry      Component Value Date/Time   NA 140 05/24/2014 1216   NA 138 10/06/2013 1920   K 3.0* 05/24/2014 1216   K 4.3 10/06/2013 1920   CL 103 10/06/2013 1920   CL 107 03/10/2013 1014   CO2 26 05/24/2014 1216   CO2 25 10/06/2013 1920   BUN 14.0 05/24/2014 1216   BUN 10 10/06/2013 1920   CREATININE 0.8 05/24/2014 1216   CREATININE 0.63 10/06/2013 1920      Component Value Date/Time   CALCIUM 10.2 05/24/2014 1216   CALCIUM 9.8 10/06/2013 1920   ALKPHOS 92 05/24/2014 1216   ALKPHOS 100 10/06/2013 1920   AST 75* 05/24/2014 1216   AST 16 10/06/2013 1920   ALT 87* 05/24/2014 1216   ALT 11 10/06/2013 1920    BILITOT 0.78 05/24/2014 1216   BILITOT 0.3 10/06/2013 1920     ASSESSMENT & PLAN:  MULTIPLE  MYELOMA The patient stopped treatment recently for treatment holiday but since then has worsening pain. I am very concerned that her disease has relapsed. I will order bloodwork and staging PET scan and see her back with discussion about possibility of resumption of treatment as soon as possible.  Smoker I spent some time counseling the patient the importance of tobacco cessation. She is currently not interested to quit now.   HYPERTENSION Her blood pressure is very high, likely due to anxiety and worsening pain. I will start her on lisinopril to control her blood pressure in the meantime.  Low back pain She has worsening pain despite on chronic pain medicine. I recommend addition of dexamethasone for bone pain.  We discussed the importance of preventive care and reviewed the vaccination programs. She does not have any prior allergic reactions to influenza vaccination. She agrees to proceed with influenza vaccination today and we will administer it today at the clinic.  Orders Placed This Encounter  Procedures  . NM PET Image Restage (PS) Whole Body    Standing Status: Future     Number of Occurrences:      Standing Expiration Date: 09/09/2015    Order Specific Question:  Reason for Exam (SYMPTOM  OR DIAGNOSIS REQUIRED)    Answer:  staging myelome, increased bone pain    Order Specific Question:  Preferred imaging location?    Answer:  [facility]  . CBC with Differential    Standing Status: Standing     Number of Occurrences: 9     Standing Expiration Date: 07/11/2015  . SPEP & IFE with QIG    Standing Status: Future     Number of Occurrences: 1     Standing Expiration Date: 08/14/2015  . Kappa/lambda light chains    Standing Status: Future     Number of Occurrences: 1     Standing Expiration Date: 08/14/2015  . Beta 2 microglobulin, serum    Standing Status: Future      Number of  Occurrences: 1     Standing Expiration Date: 08/14/2015   All questions were answered. The patient knows to call the clinic with any problems, questions or concerns. No barriers to learning was detected. I spent 30 minutes counseling the patient face to face. The total time spent in the appointment was 40 minutes and more than 50% was on counseling and review of test results     [facility], [place], MD 07/10/2014 1:48 PM

## 2014-07-10 NOTE — Assessment & Plan Note (Signed)
The patient stopped treatment recently for treatment holiday but since then has worsening pain. I am very concerned that her disease has relapsed. I will order bloodwork and staging PET scan and see her back with discussion about possibility of resumption of treatment as soon as possible.

## 2014-07-10 NOTE — Telephone Encounter (Signed)
pt sent back to lab and given appt schedule sept. per 9/8 pof f/u as scheduled. central will call pt re pet - pt aware.

## 2014-07-10 NOTE — Telephone Encounter (Signed)
Pt reports new severe pains in her left shoulder blade.  Started over the weekend. Asks if she can see Dr.  Alvy Bimler sooner than 9/21 as scheduled.

## 2014-07-10 NOTE — Assessment & Plan Note (Signed)
Her blood pressure is very high, likely due to anxiety and worsening pain. I will start her on lisinopril to control her blood pressure in the meantime.

## 2014-07-10 NOTE — Assessment & Plan Note (Addendum)
She has worsening pain despite on chronic pain medicine. I recommend addition of dexamethasone for bone pain.

## 2014-07-10 NOTE — Assessment & Plan Note (Signed)
I spent some time counseling the patient the importance of tobacco cessation. She is currently not interested to quit now. 

## 2014-07-10 NOTE — Telephone Encounter (Signed)
Dr. Alvy Bimler can see pt today at 12:30 pm.  Pt agreed.

## 2014-07-10 NOTE — Telephone Encounter (Signed)
added pt appt for today per staff....per staff msg pt aware

## 2014-07-12 LAB — SPEP & IFE WITH QIG
ALPHA-1-GLOBULIN: 5.7 % — AB (ref 2.9–4.9)
Albumin ELP: 61.3 % (ref 55.8–66.1)
Alpha-2-Globulin: 14.4 % — ABNORMAL HIGH (ref 7.1–11.8)
Beta 2: 3.3 % (ref 3.2–6.5)
Beta Globulin: 6.1 % (ref 4.7–7.2)
GAMMA GLOBULIN: 9.2 % — AB (ref 11.1–18.8)
IGG (IMMUNOGLOBIN G), SERUM: 677 mg/dL — AB (ref 690–1700)
IGM, SERUM: 70 mg/dL (ref 52–322)
IgA: 35 mg/dL — ABNORMAL LOW (ref 69–380)
Total Protein, Serum Electrophoresis: 6.5 g/dL (ref 6.0–8.3)

## 2014-07-12 LAB — KAPPA/LAMBDA LIGHT CHAINS
Kappa free light chain: 72.6 mg/dL — ABNORMAL HIGH (ref 0.33–1.94)
Kappa:Lambda Ratio: 105.22 — ABNORMAL HIGH (ref 0.26–1.65)
Lambda Free Lght Chn: 0.69 mg/dL (ref 0.57–2.63)

## 2014-07-12 LAB — BETA 2 MICROGLOBULIN, SERUM: Beta-2 Microglobulin: 2.58 mg/L — ABNORMAL HIGH (ref <=2.51)

## 2014-07-18 ENCOUNTER — Telehealth: Payer: Self-pay | Admitting: *Deleted

## 2014-07-18 NOTE — Telephone Encounter (Signed)
Instructed pt to keep her appt for PET if she still having back pain even if it has moved.  She agreed.

## 2014-07-18 NOTE — Telephone Encounter (Signed)
Pt left VM states the pain in her shoulder has moved to her mid back.  She asks if she still needs to keep appt for scan tomorrow? Called pt back and left her VM to call nurse back again.

## 2014-07-19 ENCOUNTER — Ambulatory Visit (HOSPITAL_COMMUNITY)
Admission: RE | Admit: 2014-07-19 | Discharge: 2014-07-19 | Disposition: A | Discharge status: Home / Self Care | Payer: Medicare Other | Source: Ambulatory Visit | Attending: Hematology and Oncology | Admitting: Hematology and Oncology

## 2014-07-19 ENCOUNTER — Encounter (HOSPITAL_COMMUNITY): Payer: Self-pay

## 2014-07-19 DIAGNOSIS — C9000 Multiple myeloma not having achieved remission: Secondary | ICD-10-CM | POA: Insufficient documentation

## 2014-07-19 DIAGNOSIS — I10 Essential (primary) hypertension: Secondary | ICD-10-CM | POA: Insufficient documentation

## 2014-07-19 LAB — GLUCOSE, CAPILLARY: Glucose-Capillary: 102 mg/dL — ABNORMAL HIGH (ref 70–99)

## 2014-07-19 MED ORDER — FLUDEOXYGLUCOSE F - 18 (FDG) INJECTION: 6.0300 | Freq: Once | INTRAVENOUS | Status: AC | PRN

## 2014-07-23 ENCOUNTER — Ambulatory Visit: Payer: Self-pay

## 2014-07-23 ENCOUNTER — Other Ambulatory Visit (HOSPITAL_BASED_OUTPATIENT_CLINIC_OR_DEPARTMENT_OTHER): Payer: Medicare Other

## 2014-07-23 ENCOUNTER — Ambulatory Visit (HOSPITAL_BASED_OUTPATIENT_CLINIC_OR_DEPARTMENT_OTHER): Payer: Medicare Other | Admitting: Hematology and Oncology

## 2014-07-23 ENCOUNTER — Other Ambulatory Visit: Payer: Self-pay | Admitting: Hematology and Oncology

## 2014-07-23 ENCOUNTER — Encounter: Payer: Self-pay | Admitting: Hematology and Oncology

## 2014-07-23 VITALS — BP 155/106 | HR 82 | Temp 98.4°F | Resp 18 | Ht 64.0 in | Wt 125.0 lb

## 2014-07-23 DIAGNOSIS — M545 Low back pain, unspecified: Secondary | ICD-10-CM

## 2014-07-23 DIAGNOSIS — I10 Essential (primary) hypertension: Secondary | ICD-10-CM

## 2014-07-23 DIAGNOSIS — C9000 Multiple myeloma not having achieved remission: Secondary | ICD-10-CM

## 2014-07-23 DIAGNOSIS — F17200 Nicotine dependence, unspecified, uncomplicated: Secondary | ICD-10-CM

## 2014-07-23 LAB — COMPREHENSIVE METABOLIC PANEL (CC13)
ALT: 25 U/L (ref 0–55)
AST: 28 U/L (ref 5–34)
Albumin: 3.7 g/dL (ref 3.5–5.0)
Alkaline Phosphatase: 79 U/L (ref 40–150)
Anion Gap: 11 mEq/L (ref 3–11)
BUN: 9.5 mg/dL (ref 7.0–26.0)
CALCIUM: 9.7 mg/dL (ref 8.4–10.4)
CHLORIDE: 107 meq/L (ref 98–109)
CO2: 23 mEq/L (ref 22–29)
Creatinine: 0.8 mg/dL (ref 0.6–1.1)
GLUCOSE: 96 mg/dL (ref 70–140)
POTASSIUM: 3.6 meq/L (ref 3.5–5.1)
Sodium: 141 mEq/L (ref 136–145)
Total Bilirubin: 0.39 mg/dL (ref 0.20–1.20)
Total Protein: 6.8 g/dL (ref 6.4–8.3)

## 2014-07-23 LAB — CBC WITH DIFFERENTIAL/PLATELET
BASO%: 1 % (ref 0.0–2.0)
BASOS ABS: 0.1 10*3/uL (ref 0.0–0.1)
EOS ABS: 0.2 10*3/uL (ref 0.0–0.5)
EOS%: 2.9 % (ref 0.0–7.0)
HCT: 47.5 % — ABNORMAL HIGH (ref 34.8–46.6)
HEMOGLOBIN: 15.5 g/dL (ref 11.6–15.9)
LYMPH#: 1.1 10*3/uL (ref 0.9–3.3)
LYMPH%: 18.8 % (ref 14.0–49.7)
MCH: 31.1 pg (ref 25.1–34.0)
MCHC: 32.7 g/dL (ref 31.5–36.0)
MCV: 95.1 fL (ref 79.5–101.0)
MONO#: 0.6 10*3/uL (ref 0.1–0.9)
MONO%: 9.4 % (ref 0.0–14.0)
NEUT%: 67.9 % (ref 38.4–76.8)
NEUTROS ABS: 4.1 10*3/uL (ref 1.5–6.5)
Platelets: 166 10*3/uL (ref 145–400)
RBC: 4.99 10*6/uL (ref 3.70–5.45)
RDW: 13.5 % (ref 11.2–14.5)
WBC: 6 10*3/uL (ref 3.9–10.3)

## 2014-07-23 NOTE — Assessment & Plan Note (Signed)
She was started on lisinopril recently. Recommendation follows his primary care provider for medication adjustment. Her high blood pressure could be related to uncontrolled pain.

## 2014-07-23 NOTE — Assessment & Plan Note (Signed)
She has worsening pain due to progression of disease. She is currently following another physician for pain management. She felt that recent dexamethasone appears to have helped with the pain and she continues her same. 

## 2014-07-23 NOTE — Progress Notes (Signed)
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   01/03/2007 -  Radiation Therapy Approximate date only, received RT for plasmacytoma followed by surgery   09/14/2007 Initial Diagnosis MULTIPLE  MYELOMA   10/05/2007 Bone Marrow Biopsy Bm biopsy was negative   06/18/2008 Bone Marrow Biopsy BM biopsy was negative   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 [facility]   04/13/2011 Relapse/Recurrence Disease relapse   04/14/2011 Bone Marrow Biopsy Bm biopsy showed 2 % plasma cell   04/27/2011 Relapse/Recurrence Disease relapse, treated with Velcade/Cytoxan/Dex   09/07/2011 - 07/28/2013 Chemotherapy She has been receiving Velcade   12/27/2011 Bone Marrow Transplant 2nd transplant at [facility]   07/28/2013 Relapse/Recurrence Chemo is stopped due to progression of disease   08/29/2013 Imaging PEt/CT showed recurrence of disease with new lesion on her rib with compression fracture   09/13/2013 Bone Marrow Biopsy BM biopsy is hypercellular with 6% plasma cell   10/10/2013 - 10/20/2013 Radiation Therapy Started on palliative XRT for rib pain   11/27/2013 - 04/06/2014 Chemotherapy The patient starts chemotherapy with Carfilzomib   07/19/2014 Imaging Repeat bloodwork and PET/CT scan shows significant disease progression.    INTERVAL HISTORY: Please see below for problem oriented charting. Complaint of severe back pain. Since I saw her on dexamethasone, it has improve the pain very little.  REVIEW  OF SYSTEMS:   Constitutional: Denies fevers, chills or abnormal weight loss Eyes: Denies blurriness of vision Ears, nose, mouth, throat, and face: Denies mucositis or sore throat Respiratory: Denies cough, dyspnea or wheezes Cardiovascular: Denies palpitation, chest discomfort or lower extremity swelling Gastrointestinal:  Denies nausea, heartburn or change in bowel habits Skin: Denies abnormal skin rashes Lymphatics: Denies new lymphadenopathy or easy bruising Neurological:Denies numbness, tingling or new weaknesses Behavioral/Psych: Mood is stable, no new changes  All other systems were reviewed with the patient and are negative.  I have reviewed the past medical history, past surgical history, social history and family history with the patient and they are unchanged from previous note.  ALLERGIES:  is allergic to codeine and ibuprofen.  MEDICATIONS:  Current Outpatient Prescriptions  Medication Sig Dispense Refill  . acyclovir (ZOVIRAX) 400 MG tablet Take 1 tablet (400 mg total) by mouth daily.  30 tablet  3  . albuterol (PROVENTIL HFA;VENTOLIN HFA) 108 (90 BASE) MCG/ACT inhaler Inhale 2 puffs into the lungs every 4 (four) hours as needed for wheezing or shortness of breath.  1 Inhaler  0  . Alum & Mag Hydroxide-Simeth (MAGIC MOUTHWASH) SOLN Take 10 mLs by mouth 4 (four) times daily as needed for mouth pain. Swish and spit  Or  Swish and swallow.  300 mL  2  . Cholecalciferol (VITAMIN D-3) 5000 UNITS TABS Take 1 tablet by mouth every morning.      Marland Kitchen dexamethasone (DECADRON) 4 MG tablet Take 1 tablet (4 mg total) by mouth daily.  30 tablet  1  . lidocaine-prilocaine (EMLA) cream Apply 1 application topically as needed.  30 g  0  .  lisinopril (PRINIVIL) 10 MG tablet Take 1 tablet (10 mg total) by mouth daily.  30 tablet  0  . magnesium hydroxide (MILK OF MAGNESIA) 400 MG/5ML suspension Take 30 mLs by mouth daily as needed for mild constipation.      Marland Kitchen morphine (MS CONTIN) 60 MG 12 hr  tablet Take 60 mg by mouth every 12 (twelve) hours.      Marland Kitchen morphine (MSIR) 15 MG tablet Take 15 mg by mouth every 6 (six) hours as needed for severe pain.      Marland Kitchen prochlorperazine (COMPAZINE) 10 MG tablet Take 1 tablet (10 mg total) by mouth every 6 (six) hours as needed for nausea.  60 tablet  3  . [DISCONTINUED] amLODipine (NORVASC) 5 MG tablet Take 1 tablet (5 mg total) by mouth daily.  90 tablet  3  . [DISCONTINUED] gabapentin (NEURONTIN) 600 MG tablet Take 600 mg by mouth 3 (three) times daily.         No current facility-administered medications for this visit.   Facility-Administered Medications Ordered in Other Visits  Medication Dose Route Frequency Provider Last Rate Last Dose  . sodium chloride 0.9 % injection 10 mL  10 mL Intravenous PRN Heath Lark, MD   10 mL at 02/26/14 1425  . sodium chloride 0.9 % injection 10 mL  10 mL Intravenous PRN Chauncey Cruel, MD   10 mL at 04/05/14 1333    PHYSICAL EXAMINATION: ECOG PERFORMANCE STATUS: 1 - Symptomatic but completely ambulatory  Filed Vitals:   07/23/14 1106  BP: 155/106  Pulse: 82  Temp: 98.4 F (36.9 C)  Resp: 18   Filed Weights   07/23/14 1106  Weight: 125 lb (56.7 kg)    GENERAL:alert, no distress and uncomfortable SKIN: skin color, texture, turgor are normal, no rashes or significant lesions EYES: normal, Conjunctiva are pink and non-injected, sclera clear Musculoskeletal:no cyanosis of digits and no clubbing  NEURO: alert & oriented x 3 with fluent speech, no focal motor/sensory deficits  LABORATORY DATA:  I have reviewed the data as listed    Component Value Date/Time   NA 141 07/23/2014 1050   NA 138 10/06/2013 1920   K 3.6 07/23/2014 1050   K 4.3 10/06/2013 1920   CL 103 10/06/2013 1920   CL 107 03/10/2013 1014   CO2 23 07/23/2014 1050   CO2 25 10/06/2013 1920   GLUCOSE 96 07/23/2014 1050   GLUCOSE 99 10/06/2013 1920   GLUCOSE 111* 03/10/2013 1014   GLUCOSE 98 08/31/2006 1024   BUN 9.5 07/23/2014 1050   BUN 10  10/06/2013 1920   CREATININE 0.8 07/23/2014 1050   CREATININE 0.63 10/06/2013 1920   CALCIUM 9.7 07/23/2014 1050   CALCIUM 9.8 10/06/2013 1920   PROT 6.8 07/23/2014 1050   PROT 7.2 10/06/2013 1920   ALBUMIN 3.7 07/23/2014 1050   ALBUMIN 3.7 10/06/2013 1920   AST 28 07/23/2014 1050   AST 16 10/06/2013 1920   ALT 25 07/23/2014 1050   ALT 11 10/06/2013 1920   ALKPHOS 79 07/23/2014 1050   ALKPHOS 100 10/06/2013 1920   BILITOT 0.39 07/23/2014 1050   BILITOT 0.3 10/06/2013 1920   GFRNONAA >90 10/06/2013 1920   GFRAA >90 10/06/2013 1920    No results found for this basename: SPEP, UPEP,  kappa and lambda light chains    Lab Results  Component Value Date   WBC 6.0 07/23/2014   NEUTROABS 4.1 07/23/2014   HGB 15.5 07/23/2014   HCT 47.5* 07/23/2014  MCV 95.1 07/23/2014   PLT 166 07/23/2014      Chemistry      Component Value Date/Time   NA 141 07/23/2014 1050   NA 138 10/06/2013 1920   K 3.6 07/23/2014 1050   K 4.3 10/06/2013 1920   CL 103 10/06/2013 1920   CL 107 03/10/2013 1014   CO2 23 07/23/2014 1050   CO2 25 10/06/2013 1920   BUN 9.5 07/23/2014 1050   BUN 10 10/06/2013 1920   CREATININE 0.8 07/23/2014 1050   CREATININE 0.63 10/06/2013 1920      Component Value Date/Time   CALCIUM 9.7 07/23/2014 1050   CALCIUM 9.8 10/06/2013 1920   ALKPHOS 79 07/23/2014 1050   ALKPHOS 100 10/06/2013 1920   AST 28 07/23/2014 1050   AST 16 10/06/2013 1920   ALT 25 07/23/2014 1050   ALT 11 10/06/2013 1920   BILITOT 0.39 07/23/2014 1050   BILITOT 0.3 10/06/2013 1920       RADIOGRAPHIC STUDIES: Reviewed the PET/CT scan with her. I have personally reviewed the radiological images as listed and agreed with the findings in the report.   ASSESSMENT & PLAN:  MULTIPLE  MYELOMA She has progressed on multiple lines of therapy. I recommend consideration for enrollment on clinical trial using Elotuzumab, Revlimid and dexamethasone. I spoke with the research coordinator who has spent some time with the patient, she will think  about it. In the meantime, I would recommend she consider going back to [facility] for further management.  Smoker I spent some time counseling the patient the importance of tobacco cessation. She is currently not interested to quit now.   HYPERTENSION She was started on lisinopril recently. Recommendation follows his primary care provider for medication adjustment. Her high blood pressure could be related to uncontrolled pain.  Bilateral low back pain without sciatica She has worsening pain due to progression of disease. She is currently following another physician for pain management. She felt that recent dexamethasone appears to have helped with the pain and she continues her same.   No orders of the defined types were placed in this encounter.   All questions were answered. The patient knows to call the clinic with any problems, questions or concerns. No barriers to learning was detected. I spent 30 minutes counseling the patient face to face. The total time spent in the appointment was 40 minutes and more than 50% was on counseling and review of test results     [facility], [place], MD 07/23/2014 8:55 PM

## 2014-07-23 NOTE — Assessment & Plan Note (Signed)
I spent some time counseling the patient the importance of tobacco cessation. She is currently not interested to quit now. 

## 2014-07-23 NOTE — Assessment & Plan Note (Signed)
She has progressed on multiple lines of therapy. I recommend consideration for enrollment on clinical trial using Elotuzumab, Revlimid and dexamethasone. I spoke with the research coordinator who has spent some time with the patient, she will think about it. In the meantime, I would recommend she consider going back to [facility] for further management.

## 2014-07-24 ENCOUNTER — Ambulatory Visit: Payer: Self-pay

## 2014-07-25 ENCOUNTER — Encounter: Payer: Self-pay | Admitting: *Deleted

## 2014-07-25 ENCOUNTER — Encounter: Payer: Self-pay | Admitting: Family Medicine

## 2014-07-25 ENCOUNTER — Ambulatory Visit (INDEPENDENT_AMBULATORY_CARE_PROVIDER_SITE_OTHER): Payer: Medicare Other | Admitting: Family Medicine

## 2014-07-25 VITALS — BP 144/98 | HR 88 | Temp 98.4°F | Wt 122.0 lb

## 2014-07-25 DIAGNOSIS — I10 Essential (primary) hypertension: Secondary | ICD-10-CM

## 2014-07-25 NOTE — Assessment & Plan Note (Signed)
Poorly controlled. Could be related to pain but patient states regularly elevated at home. We will titrate back up on lisinopril with 20mg  x 1 week, contact me in 1 week and likely 40mg  with 2 week follow up. May have had cough before with lisinopril so will need to follow for this if recurrent. Could try losartan as alternative.

## 2014-07-25 NOTE — Patient Instructions (Signed)
Start taking 1/2 of one of your 20mg  pills over the next week.  In 1 week, call or message me with once daily blood pressures Likely i will increase to 40mg  at that time.  See me in 2 weeks to see if we need further adjustments.

## 2014-07-25 NOTE — Progress Notes (Signed)
[name], MD Phone: [phone]  Subjective:   Kendra Johns is a 63 y.o. year old very pleasant female patient who presents with the following:  Hypertension-poor control  BP Readings from Last 3 Encounters:  07/25/14 144/98  07/23/14 155/106  07/10/14 170/103   Home BP monitoring-150/100 typically but varies with level of pain, not typically less than this though Compliant with medications-yes without side effects, lisinopril 37m ROS-Denies any CP, HA, SOB, blurry vision, LE edema, transient weakness, orthopnea, PND.   Past Medical History-multiple myeloma, bony pain from multiple myeloma, Hep C, depression  Medications- reviewed and updated Current Outpatient Prescriptions  Medication Sig Dispense Refill  . Cholecalciferol (VITAMIN D-3) 5000 UNITS TABS Take 1 tablet by mouth every morning.      .Marland Kitchendexamethasone (DECADRON) 4 MG tablet Take 1 tablet (4 mg total) by mouth daily.  30 tablet  1  . lidocaine-prilocaine (EMLA) cream Apply 1 application topically as needed.  30 g  0  . lisinopril (PRINIVIL) 10 MG tablet Take 1 tablet (10 mg total) by mouth daily.  30 tablet  0  . morphine (MS CONTIN) 60 MG 12 hr tablet Take 60 mg by mouth every 12 (twelve) hours.      .Marland Kitchenmorphine (MSIR) 15 MG tablet Take 15 mg by mouth every 6 (six) hours as needed for severe pain.      .Marland Kitchenprochlorperazine (COMPAZINE) 10 MG tablet Take 1 tablet (10 mg total) by mouth every 6 (six) hours as needed for nausea.  60 tablet  3  . acyclovir (ZOVIRAX) 400 MG tablet Take 1 tablet (400 mg total) by mouth daily.  30 tablet  3  . albuterol (PROVENTIL HFA;VENTOLIN HFA) 108 (90 BASE) MCG/ACT inhaler Inhale 2 puffs into the lungs every 4 (four) hours as needed for wheezing or shortness of breath.  1 Inhaler  0  . Alum & Mag Hydroxide-Simeth (MAGIC MOUTHWASH) SOLN Take 10 mLs by mouth 4 (four) times daily as needed for mouth pain. Swish and spit  Or  Swish and swallow.  300 mL  2  . magnesium hydroxide (MILK OF  MAGNESIA) 400 MG/5ML suspension Take 30 mLs by mouth daily as needed for mild constipation.      . [DISCONTINUED] amLODipine (NORVASC) 5 MG tablet Take 1 tablet (5 mg total) by mouth daily.  90 tablet  3  . [DISCONTINUED] gabapentin (NEURONTIN) 600 MG tablet Take 600 mg by mouth 3 (three) times daily.         No current facility-administered medications for this visit.   Facility-Administered Medications Ordered in Other Visits  Medication Dose Route Frequency Provider Last Rate Last Dose  . sodium chloride 0.9 % injection 10 mL  10 mL Intravenous PRN NHeath Lark MD   10 mL at 02/26/14 1425  . sodium chloride 0.9 % injection 10 mL  10 mL Intravenous PRN GChauncey Cruel MD   10 mL at 04/05/14 1333    Objective: BP 144/98  Pulse 88  Temp(Src) 98.4 F (36.9 C)  Wt 122 lb (55.339 kg) Gen: NAD, resting comfortably in chair CV: RRR no murmurs rubs or gallops Lungs: CTAB no crackles, wheeze, rhonchi Abdomen: soft/nontender/nondistended/normal bowel sounds.  Ext: no edema Skin: warm, dry, no rash Neuro: grossly normal, moves all extremities  Assessment/Plan:  HYPERTENSION Poorly controlled. Could be related to pain but patient states regularly elevated at home. We will titrate back up on lisinopril with 270mx 1 week, contact me in 1 week and likely  52m with 2 week follow up. May have had cough before with lisinopril so will need to follow for this if recurrent. Could try losartan as alternative.

## 2014-07-26 ENCOUNTER — Telehealth: Payer: Self-pay | Admitting: *Deleted

## 2014-07-26 NOTE — Telephone Encounter (Signed)
Informed pt that Dr. Alvy Bimler advised she start chemo ASAP and worry about her dental work later.  She verbalized understanding.

## 2014-07-26 NOTE — Telephone Encounter (Signed)
She has to wait on the tooth. Start chemo ASAP

## 2014-07-26 NOTE — Telephone Encounter (Signed)
Pt left VM yesterday afternoon.  She said she wanted to talk to Dr. Alvy Bimler to see if a certain trial is recommended.  Called pt back to get more information.  Left her a VM to call nurse back.

## 2014-07-26 NOTE — Telephone Encounter (Signed)
Pt wants to ask Dr. Alvy Bimler if it would be safe/ok to proceed w/ clinical trial (Elotuzumab/Dex/Revlmid) even with some broken teeth.  Pt went to dentist this morning and had planned to have some dental work done.   Unfortunately, she has found out that her [employer] does not cover any Dentists in [place].   She is worried the new chemo may cause infection if her teeth are not fixed. Pt has appt to meet w/ Research Nurse on Monday.  Instructed pt to address these concerns with her and we will also ask Dr. Alvy Bimler.  Pt verbalized understanding.

## 2014-07-27 ENCOUNTER — Telehealth: Payer: Self-pay | Admitting: *Deleted

## 2014-07-27 NOTE — Telephone Encounter (Signed)
Per staff voicemail message I have scheduled appts. Research RN to call pt.

## 2014-07-30 ENCOUNTER — Ambulatory Visit: Payer: Self-pay

## 2014-07-30 ENCOUNTER — Other Ambulatory Visit: Payer: Self-pay | Admitting: *Deleted

## 2014-07-30 ENCOUNTER — Telehealth: Payer: Self-pay | Admitting: Hematology and Oncology

## 2014-07-30 ENCOUNTER — Other Ambulatory Visit: Payer: Self-pay | Admitting: Hematology and Oncology

## 2014-07-30 ENCOUNTER — Encounter: Payer: Medicare Other | Admitting: *Deleted

## 2014-07-30 ENCOUNTER — Telehealth: Payer: Self-pay

## 2014-07-30 DIAGNOSIS — C9000 Multiple myeloma not having achieved remission: Secondary | ICD-10-CM

## 2014-07-30 MED ORDER — DEXAMETHASONE 4 MG PO TABS
ORAL_TABLET | ORAL | Status: DC
Start: 2014-07-30 — End: 2014-12-05

## 2014-07-30 MED ORDER — LENALIDOMIDE 25 MG PO CAPS: 25.0000 mg | ORAL_CAPSULE | Freq: Every day | ORAL | Status: DC

## 2014-07-30 NOTE — Telephone Encounter (Signed)
received call and forwarded staff message from [facility] in research re scheduling pt w/NG 10/1 @ 8:45am. lab also added due to order. pt given new schedule while waitng w/desk nurse.

## 2014-07-30 NOTE — Telephone Encounter (Signed)
Revlimid rx faxed to [facility], [phone]. Authorization #[id number].

## 2014-07-31 ENCOUNTER — Ambulatory Visit: Payer: Self-pay

## 2014-07-31 ENCOUNTER — Other Ambulatory Visit: Payer: Self-pay | Admitting: *Deleted

## 2014-07-31 ENCOUNTER — Telehealth: Payer: Self-pay | Admitting: *Deleted

## 2014-07-31 ENCOUNTER — Encounter: Payer: Self-pay | Admitting: Hematology and Oncology

## 2014-07-31 NOTE — Progress Notes (Signed)
Faxed revlimid prescription to [facility]

## 2014-07-31 NOTE — Telephone Encounter (Signed)
Pt left VM asking about Revlimid.  States supposed to start it on Thursday and has not heard anything about it yet.  Rx for Revlimid given to Carmelina Noun in Managed care dept [facility]  Informed pt it may not be possible to get Revlimid by Thursday but we will continue to work on it.  She verbalized understanding.

## 2014-08-02 ENCOUNTER — Encounter: Payer: Self-pay | Admitting: Hematology and Oncology

## 2014-08-02 ENCOUNTER — Encounter: Payer: Self-pay | Admitting: *Deleted

## 2014-08-02 ENCOUNTER — Ambulatory Visit (HOSPITAL_BASED_OUTPATIENT_CLINIC_OR_DEPARTMENT_OTHER): Payer: Medicare Other

## 2014-08-02 ENCOUNTER — Ambulatory Visit (HOSPITAL_COMMUNITY)
Admission: RE | Admit: 2014-08-02 | Discharge: 2014-08-02 | Disposition: A | Discharge status: Home / Self Care | Payer: Medicare Other | Source: Ambulatory Visit | Attending: Hematology and Oncology | Admitting: Hematology and Oncology

## 2014-08-02 ENCOUNTER — Other Ambulatory Visit (HOSPITAL_BASED_OUTPATIENT_CLINIC_OR_DEPARTMENT_OTHER): Payer: Medicare Other

## 2014-08-02 ENCOUNTER — Ambulatory Visit (HOSPITAL_BASED_OUTPATIENT_CLINIC_OR_DEPARTMENT_OTHER): Payer: Medicare Other | Admitting: Hematology and Oncology

## 2014-08-02 VITALS — BP 127/79

## 2014-08-02 VITALS — BP 171/108 | HR 92 | Temp 98.2°F | Resp 18 | Ht 64.0 in | Wt 120.1 lb

## 2014-08-02 DIAGNOSIS — F17200 Nicotine dependence, unspecified, uncomplicated: Secondary | ICD-10-CM

## 2014-08-02 DIAGNOSIS — I10 Essential (primary) hypertension: Secondary | ICD-10-CM

## 2014-08-02 DIAGNOSIS — R05 Cough: Secondary | ICD-10-CM | POA: Insufficient documentation

## 2014-08-02 DIAGNOSIS — R0602 Shortness of breath: Secondary | ICD-10-CM | POA: Insufficient documentation

## 2014-08-02 DIAGNOSIS — C9000 Multiple myeloma not having achieved remission: Secondary | ICD-10-CM

## 2014-08-02 DIAGNOSIS — R059 Cough, unspecified: Secondary | ICD-10-CM

## 2014-08-02 DIAGNOSIS — Z720 Tobacco use: Secondary | ICD-10-CM

## 2014-08-02 DIAGNOSIS — Z5112 Encounter for antineoplastic immunotherapy: Secondary | ICD-10-CM

## 2014-08-02 HISTORY — DX: Cough, unspecified: R05.9

## 2014-08-02 LAB — COMPREHENSIVE METABOLIC PANEL (CC13)
ALBUMIN: 3.9 g/dL (ref 3.5–5.0)
ALT: 19 U/L (ref 0–55)
ANION GAP: 8 meq/L (ref 3–11)
AST: 25 U/L (ref 5–34)
Alkaline Phosphatase: 95 U/L (ref 40–150)
BILIRUBIN TOTAL: 0.52 mg/dL (ref 0.20–1.20)
BUN: 9.9 mg/dL (ref 7.0–26.0)
CALCIUM: 10.4 mg/dL (ref 8.4–10.4)
CHLORIDE: 108 meq/L (ref 98–109)
CO2: 22 mEq/L (ref 22–29)
Creatinine: 0.8 mg/dL (ref 0.6–1.1)
GLUCOSE: 140 mg/dL (ref 70–140)
Potassium: 3.9 mEq/L (ref 3.5–5.1)
SODIUM: 138 meq/L (ref 136–145)
TOTAL PROTEIN: 7.4 g/dL (ref 6.4–8.3)

## 2014-08-02 LAB — CBC WITH DIFFERENTIAL/PLATELET
BASO%: 0.3 % (ref 0.0–2.0)
Basophils Absolute: 0 10*3/uL (ref 0.0–0.1)
EOS ABS: 0 10*3/uL (ref 0.0–0.5)
EOS%: 0.4 % (ref 0.0–7.0)
HCT: 48.4 % — ABNORMAL HIGH (ref 34.8–46.6)
HGB: 16.4 g/dL — ABNORMAL HIGH (ref 11.6–15.9)
LYMPH%: 11.9 % — ABNORMAL LOW (ref 14.0–49.7)
MCH: 31.2 pg (ref 25.1–34.0)
MCHC: 33.9 g/dL (ref 31.5–36.0)
MCV: 92 fL (ref 79.5–101.0)
MONO#: 0.2 10*3/uL (ref 0.1–0.9)
MONO%: 2.3 % (ref 0.0–14.0)
NEUT#: 5.8 10*3/uL (ref 1.5–6.5)
NEUT%: 85.1 % — ABNORMAL HIGH (ref 38.4–76.8)
NRBC: 0 % (ref 0–0)
Platelets: 161 10*3/uL (ref 145–400)
RBC: 5.26 10*6/uL (ref 3.70–5.45)
RDW: 13 % (ref 11.2–14.5)
WBC: 6.8 10*3/uL (ref 3.9–10.3)
lymph#: 0.8 10*3/uL — ABNORMAL LOW (ref 0.9–3.3)

## 2014-08-02 MED ORDER — ACETAMINOPHEN 325 MG PO TABS
650.0000 mg | ORAL_TABLET | Freq: Once | ORAL | Status: AC
  Administered 2014-08-02: 650 mg via ORAL

## 2014-08-02 MED ORDER — SODIUM CHLORIDE 0.9 % IV SOLN
Freq: Once | INTRAVENOUS | Status: AC
  Administered 2014-08-02: 10:00:00 via INTRAVENOUS

## 2014-08-02 MED ORDER — DEXAMETHASONE SODIUM PHOSPHATE 10 MG/ML IJ SOLN
8.0000 mg | Freq: Once | INTRAMUSCULAR | Status: AC
  Administered 2014-08-02: 8 mg via INTRAVENOUS

## 2014-08-02 MED ORDER — DEXAMETHASONE SODIUM PHOSPHATE 10 MG/ML IJ SOLN
INTRAMUSCULAR | Status: AC
  Filled 2014-08-02: qty 1

## 2014-08-02 MED ORDER — ACETAMINOPHEN 325 MG PO TABS
ORAL_TABLET | ORAL | Status: AC
  Filled 2014-08-02: qty 2

## 2014-08-02 MED ORDER — DIPHENHYDRAMINE HCL 25 MG PO CAPS
50.0000 mg | ORAL_CAPSULE | Freq: Once | ORAL | Status: AC
Start: 2014-08-02 — End: 2014-08-02
  Administered 2014-08-02: 50 mg via ORAL

## 2014-08-02 MED ORDER — INV-ELOTUZUMAB CHEMO INJECTION 400MG BMS CA204-143
10.0000 mg/kg | Freq: Once | INTRAMUSCULAR | Status: AC
  Administered 2014-08-02: 545 mg via INTRAVENOUS
  Filled 2014-08-02: qty 21.8

## 2014-08-02 MED ORDER — FAMOTIDINE IN NACL 20-0.9 MG/50ML-% IV SOLN
INTRAVENOUS | Status: AC
  Filled 2014-08-02: qty 50

## 2014-08-02 MED ORDER — FAMOTIDINE IN NACL 20-0.9 MG/50ML-% IV SOLN
20.0000 mg | Freq: Once | INTRAVENOUS | Status: AC
  Administered 2014-08-02: 20 mg via INTRAVENOUS

## 2014-08-02 MED ORDER — DIPHENHYDRAMINE HCL 25 MG PO CAPS
ORAL_CAPSULE | ORAL | Status: AC
  Filled 2014-08-02: qty 2

## 2014-08-02 MED ORDER — HEPARIN SOD (PORK) LOCK FLUSH 100 UNIT/ML IV SOLN
500.0000 [IU] | Freq: Once | INTRAVENOUS | Status: AC | PRN
  Administered 2014-08-02: 500 [IU]
  Filled 2014-08-02: qty 5

## 2014-08-02 MED ORDER — SODIUM CHLORIDE 0.9 % IJ SOLN
10.0000 mL | INTRAMUSCULAR | Status: DC | PRN
  Administered 2014-08-02: 10 mL
  Filled 2014-08-02: qty 10

## 2014-08-02 MED ORDER — ONDANSETRON 8 MG/NS 50 ML IVPB
INTRAVENOUS | Status: AC
  Filled 2014-08-02: qty 8

## 2014-08-02 NOTE — Assessment & Plan Note (Signed)
She has progressed on multiple lines of therapy. I recommend consideration for enrollment on clinical trial using Elotuzumab, Revlimid and dexamethasone.  The risks, benefits, side effects of chemotherapy would discuss with the patient and she agreed to proceed. Due to poor dentition, she will not be receiving Zometa until cleared by her dentist. She will start aspirin for DVT prophylaxis while on Revlimid.

## 2014-08-02 NOTE — Assessment & Plan Note (Signed)
I would order a chest x-ray for evaluation. If it persists, I might have to discontinue lisinopril and switch her to a different medication.

## 2014-08-02 NOTE — Progress Notes (Signed)
08/02/14 @ 9:35 am, BMS [id number], Cycle 1, Day 1:  Ms. Rieger, accompanied by her daughter, into the [facility] for labs, to see Dr. Alvy Bimler, and to receive the first treatment with elotuzumab.  Provided her with a Patient Identification Card and asked that she keep it with her at all times and to share the information about the study with any caregiver that she sees. Ms. Guereca reported having taken dexamethasone 28 mg at 6:00 am this morning with food.  Her weight and vital signs were obtained and today's weight was entered for dosing of the elotuzumab.  Kit assignments were made through [employer], the [facility], and taken to the pharmacy.   Spoke at length with Ky Barban, RN and Ortencia Kick, RN in the infusion room about the timing and delivery of elotuzumab.  Provided them with written instructions on the titration of elotuzumab on Day 1 as described in the protocol and requested documentation of each increase of the drug in the EMR. Emphasized the timing of the premedications with regard to the start of elotuzumab and the requirement of administering through a 0.22 micron filter.  Also, provided the nurses with guidance on grading a transfusion reaction and recommendations for managing an infusion reaction.  Requested that they call me should the patient experience any grade of infusion reaction.   The patient's supply of lenalidomide was received at the [facility]ceiving [facility] at 10:00 am.  Evelina Bucy the medication to her in the infusion room and instructed her to swallow the capsules whole, not to chew or crush, and to take it at bedtime to minimize the drowsiness and to take it the same time each day, following the dosing calendar that she was given earlier by Doristine Johns, Research Nurse.  She agreed to all follow all instructions.

## 2014-08-02 NOTE — Progress Notes (Signed)
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   01/03/2007 -  Radiation Therapy Approximate date only, received RT for plasmacytoma followed by surgery   09/14/2007 Initial Diagnosis MULTIPLE  MYELOMA   10/05/2007 Bone Marrow Biopsy Bm biopsy was negative   06/18/2008 Bone Marrow Biopsy BM biopsy was negative   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 [facility]   04/13/2011 Relapse/Recurrence Disease relapse   04/14/2011 Bone Marrow Biopsy Bm biopsy showed 2 % plasma cell   04/27/2011 Relapse/Recurrence Disease relapse, treated with Velcade/Cytoxan/Dex   09/07/2011 - 07/28/2013 Chemotherapy She has been receiving Velcade   12/27/2011 Bone Marrow Transplant 2nd transplant at [facility]   07/28/2013 Relapse/Recurrence Chemo is stopped due to progression of disease   08/29/2013 Imaging PEt/CT showed recurrence of disease with new lesion on her rib with compression fracture   09/13/2013 Bone Marrow Biopsy BM biopsy is hypercellular with 6% plasma cell   10/10/2013 - 10/20/2013 Radiation Therapy Started on palliative XRT for rib pain   11/27/2013 - 04/06/2014 Chemotherapy The patient starts chemotherapy with Carfilzomib   07/19/2014 Imaging Repeat bloodwork and PET/CT scan shows significant disease progression.    INTERVAL HISTORY: Please see below for problem oriented charting. Her bone pain has improved. She has been having significant coughing with occasional blood-tinged sputum. Denies  any fevers or chills.  REVIEW OF SYSTEMS:   Constitutional: Denies fevers, chills or abnormal weight loss Eyes: Denies blurriness of vision Ears, nose, mouth, throat, and face: Denies mucositis or sore throat Respiratory: Denies cough, dyspnea or wheezes Cardiovascular: Denies palpitation, chest discomfort or lower extremity swelling Gastrointestinal:  Denies nausea, heartburn or change in bowel habits Skin: Denies abnormal skin rashes Lymphatics: Denies new lymphadenopathy or easy bruising Neurological:Denies numbness, tingling or new weaknesses Behavioral/Psych: Mood is stable, no new changes  All other systems were reviewed with the patient and are negative.  I have reviewed the past medical history, past surgical history, social history and family history with the patient and they are unchanged from previous note.  ALLERGIES:  is allergic to codeine and ibuprofen.  MEDICATIONS:  Current Outpatient Prescriptions  Medication Sig Dispense Refill  . albuterol (PROVENTIL HFA;VENTOLIN HFA) 108 (90 BASE) MCG/ACT inhaler Inhale 2 puffs into the lungs every 4 (four) hours as needed for wheezing or shortness of breath.  1 Inhaler  0  . Alum & Mag Hydroxide-Simeth (MAGIC MOUTHWASH) SOLN Take 10 mLs by mouth 4 (four) times daily as needed for mouth pain. Swish and spit  Or  Swish and swallow.  300 mL  2  . aspirin 81 MG tablet Take 81 mg by mouth daily.      . Cholecalciferol (VITAMIN D-3) 5000 UNITS TABS Take 1 tablet by mouth every morning.      Marland Kitchen dexamethasone (DECADRON) 4 MG tablet Take 1 tablet (4 mg total) by mouth daily.  30 tablet  1  . dexamethasone (DECADRON) 4 MG tablet Take as directed by chemotherapy protocol, 7 pills on the day of chemo  90 tablet  3  . lenalidomide (REVLIMID) 25 MG capsule Take 1 capsule (25 mg total) by mouth daily.  21 capsule  0  . lidocaine-prilocaine (EMLA) cream Apply 1 application topically as needed.  30 g  0  . lisinopril (PRINIVIL,ZESTRIL) 20 MG tablet  Take 20 mg by mouth daily.      . magnesium hydroxide (MILK OF MAGNESIA) 400 MG/5ML suspension Take 30 mLs by mouth daily as needed for mild constipation.      Marland Kitchen morphine (MS CONTIN) 60 MG 12 hr tablet Take 60 mg by mouth every 12 (twelve) hours.      Marland Kitchen morphine (MSIR) 15 MG tablet Take 15 mg by mouth every 6 (six) hours as needed for severe pain.      Marland Kitchen prochlorperazine (COMPAZINE) 10 MG tablet Take 1 tablet (10 mg total) by mouth every 6 (six) hours as needed for nausea.  60 tablet  3  . [DISCONTINUED] amLODipine (NORVASC) 5 MG tablet Take 1 tablet (5 mg total) by mouth daily.  90 tablet  3  . [DISCONTINUED] gabapentin (NEURONTIN) 600 MG tablet Take 600 mg by mouth 3 (three) times daily.         No current facility-administered medications for this visit.   Facility-Administered Medications Ordered in Other Visits  Medication Dose Route Frequency Provider Last Rate Last Dose  . sodium chloride 0.9 % injection 10 mL  10 mL Intravenous PRN Heath Lark, MD   10 mL at 02/26/14 1425  . sodium chloride 0.9 % injection 10 mL  10 mL Intravenous PRN Chauncey Cruel, MD   10 mL at 04/05/14 1333    PHYSICAL EXAMINATION: ECOG PERFORMANCE STATUS: 0 - Asymptomatic  Filed Vitals:   08/02/14 0855  BP: 171/108  Pulse: 92  Temp: 98.2 F (36.8 C)  Resp: 18   Filed Weights   08/02/14 0855  Weight: 120 lb 1.6 oz (54.477 kg)    GENERAL:alert, no distress and comfortable. She looks thin but not cachectic SKIN: skin color, texture, turgor are normal, no rashes or significant lesions EYES: normal, Conjunctiva are pink and non-injected, sclera clear OROPHARYNX:no exudate, no erythema and lips, buccal mucosa, and tongue normal  NECK: supple, thyroid normal size, non-tender, without nodularity LYMPH:  no palpable lymphadenopathy in the cervical, axillary or inguinal LUNGS: clear to auscultation and percussion with normal breathing effort HEART: regular rate & rhythm and no murmurs and no lower  extremity edema ABDOMEN:abdomen soft, non-tender and normal bowel sounds Musculoskeletal:no cyanosis of digits and no clubbing  NEURO: alert & oriented x 3 with fluent speech, no focal motor/sensory deficits  LABORATORY DATA:  I have reviewed the data as listed    Component Value Date/Time   NA 141 07/23/2014 1050   NA 138 10/06/2013 1920   K 3.6 07/23/2014 1050   K 4.3 10/06/2013 1920   CL 103 10/06/2013 1920   CL 107 03/10/2013 1014   CO2 23 07/23/2014 1050   CO2 25 10/06/2013 1920   GLUCOSE 96 07/23/2014 1050   GLUCOSE 99 10/06/2013 1920   GLUCOSE 111* 03/10/2013 1014   GLUCOSE 98 08/31/2006 1024   BUN 9.5 07/23/2014 1050   BUN 10 10/06/2013 1920   CREATININE 0.8 07/23/2014 1050   CREATININE 0.63 10/06/2013 1920   CALCIUM 9.7 07/23/2014 1050   CALCIUM 9.8 10/06/2013 1920   PROT 6.8 07/23/2014 1050   PROT 7.2 10/06/2013 1920   ALBUMIN 3.7 07/23/2014 1050   ALBUMIN 3.7 10/06/2013 1920   AST  28 07/23/2014 1050   AST 16 10/06/2013 1920   ALT 25 07/23/2014 1050   ALT 11 10/06/2013 1920   ALKPHOS 79 07/23/2014 1050   ALKPHOS 100 10/06/2013 1920   BILITOT 0.39 07/23/2014 1050   BILITOT 0.3 10/06/2013 1920   GFRNONAA >90 10/06/2013 1920   GFRAA >90 10/06/2013 1920    No results found for this basename: SPEP, UPEP,  kappa and lambda light chains    Lab Results  Component Value Date   WBC 6.0 07/23/2014   NEUTROABS 4.1 07/23/2014   HGB 15.5 07/23/2014   HCT 47.5* 07/23/2014   MCV 95.1 07/23/2014   PLT 166 07/23/2014      Chemistry      Component Value Date/Time   NA 141 07/23/2014 1050   NA 138 10/06/2013 1920   K 3.6 07/23/2014 1050   K 4.3 10/06/2013 1920   CL 103 10/06/2013 1920   CL 107 03/10/2013 1014   CO2 23 07/23/2014 1050   CO2 25 10/06/2013 1920   BUN 9.5 07/23/2014 1050   BUN 10 10/06/2013 1920   CREATININE 0.8 07/23/2014 1050   CREATININE 0.63 10/06/2013 1920      Component Value Date/Time   CALCIUM 9.7 07/23/2014 1050   CALCIUM 9.8 10/06/2013 1920   ALKPHOS 79 07/23/2014 1050   ALKPHOS 100  10/06/2013 1920   AST 28 07/23/2014 1050   AST 16 10/06/2013 1920   ALT 25 07/23/2014 1050   ALT 11 10/06/2013 1920   BILITOT 0.39 07/23/2014 1050   BILITOT 0.3 10/06/2013 1920      ASSESSMENT & PLAN:  Multiple myeloma She has progressed on multiple lines of therapy. I recommend consideration for enrollment on clinical trial using Elotuzumab, Revlimid and dexamethasone.  The risks, benefits, side effects of chemotherapy would discuss with the patient and she agreed to proceed. Due to poor dentition, she will not be receiving Zometa until cleared by her dentist. She will start aspirin for DVT prophylaxis while on Revlimid.  Smoker I spent some time counseling the patient the importance of tobacco cessation. She is currently not interested to quit now.   Essential hypertension She was started on lisinopril recently. Recommendation follows his primary care provider for medication adjustment. Her high blood pressure could be related to uncontrolled pain. The patient is having severe cough. CBC does not suggest infection. If her cough persists, I might have to discontinue lisinopril.    Cough I would order a chest x-ray for evaluation. If it persists, I might have to discontinue lisinopril and switch her to a different medication.   Orders Placed This Encounter  Procedures  . DG Chest 2 View    Standing Status: Future     Number of Occurrences:      Standing Expiration Date: 09/06/2015    Order Specific Question:  Reason for exam:    Answer:  cough, smoker    Order Specific Question:  Preferred imaging location?    Answer:  [facility]   All questions were answered. The patient knows to call the clinic with any problems, questions or concerns. No barriers to learning was detected. I spent 30 minutes counseling the patient face to face. The total time spent in the appointment was 40 minutes and more than 50% was on counseling and review of test results     [facility], [place],  MD 08/02/2014 9:16 AM

## 2014-08-02 NOTE — Assessment & Plan Note (Signed)
I spent some time counseling the patient the importance of tobacco cessation. She is currently not interested to quit now. 

## 2014-08-02 NOTE — Patient Instructions (Signed)
[place] Discharge Instructions for Patients Receiving Chemotherapy  Today you received the following chemotherapy agents Elotuzumab and Revlimid   To help prevent nausea and vomiting after your treatment, we encourage you to take your nausea medication as prescribed.   If you develop nausea and vomiting that is not controlled by your nausea medication, call the clinic.   BELOW ARE SYMPTOMS THAT SHOULD BE REPORTED IMMEDIATELY:  *FEVER GREATER THAN 100.5 F  *CHILLS WITH OR WITHOUT FEVER  NAUSEA AND VOMITING THAT IS NOT CONTROLLED WITH YOUR NAUSEA MEDICATION  *UNUSUAL SHORTNESS OF BREATH  *UNUSUAL BRUISING OR BLEEDING  TENDERNESS IN MOUTH AND THROAT WITH OR WITHOUT PRESENCE OF ULCERS  *URINARY PROBLEMS  *BOWEL PROBLEMS  UNUSUAL RASH Items with * indicate a potential emergency and should be followed up as soon as possible.  Feel free to call the clinic you have any questions or concerns. The clinic phone number is [phone].

## 2014-08-02 NOTE — Assessment & Plan Note (Signed)
She was started on lisinopril recently. Recommendation follows his primary care provider for medication adjustment. Her high blood pressure could be related to uncontrolled pain. The patient is having severe cough. CBC does not suggest infection. If her cough persists, I might have to discontinue lisinopril.

## 2014-08-03 ENCOUNTER — Telehealth: Payer: Self-pay | Admitting: *Deleted

## 2014-08-03 ENCOUNTER — Telehealth: Payer: Self-pay | Admitting: Hematology and Oncology

## 2014-08-03 NOTE — Telephone Encounter (Signed)
s.w. pt and advised on OCT appts....pt ok and aware...she will pick kup new sched nxt week

## 2014-08-03 NOTE — Telephone Encounter (Signed)
Spoke with pt today for post chemo follow up.  Pt stated she is doing ok.  Has a cold which Dr. Alvy Bimler is aware.  Pt knows to call office if symptoms of cold worsened.  Stated slept ok last night,  Good appetite and drinking lots of fluids as tolerated.  Has hip pain which pt takes pain meds with relief; bowel and bladder function fine; denied nausea/vomiting.  Pt aware of next returned appts.

## 2014-08-06 ENCOUNTER — Ambulatory Visit: Payer: Self-pay

## 2014-08-06 ENCOUNTER — Encounter: Payer: Self-pay | Admitting: Hematology and Oncology

## 2014-08-06 LAB — SPEP & IFE WITH QIG
ALPHA-2-GLOBULIN: 14.6 % — AB (ref 7.1–11.8)
Albumin ELP: 60.8 % (ref 55.8–66.1)
Alpha-1-Globulin: 5.7 % — ABNORMAL HIGH (ref 2.9–4.9)
Beta 2: 3.1 % — ABNORMAL LOW (ref 3.2–6.5)
Beta Globulin: 6.1 % (ref 4.7–7.2)
GAMMA GLOBULIN: 9.7 % — AB (ref 11.1–18.8)
IGG (IMMUNOGLOBIN G), SERUM: 700 mg/dL (ref 690–1700)
IGM, SERUM: 96 mg/dL (ref 52–322)
IgA: 36 mg/dL — ABNORMAL LOW (ref 69–380)
Total Protein, Serum Electrophoresis: 6.9 g/dL (ref 6.0–8.3)

## 2014-08-06 LAB — KAPPA/LAMBDA LIGHT CHAINS
Kappa free light chain: 88.4 mg/dL — ABNORMAL HIGH (ref 0.33–1.94)
Kappa:Lambda Ratio: 97.14 — ABNORMAL HIGH (ref 0.26–1.65)
Lambda Free Lght Chn: 0.91 mg/dL (ref 0.57–2.63)

## 2014-08-06 LAB — BETA 2 MICROGLOBULIN, SERUM: Beta-2 Microglobulin: 2.58 mg/L — ABNORMAL HIGH (ref <=2.51)

## 2014-08-06 NOTE — Progress Notes (Signed)
Received letter from Patient [employer].  Pt is approved for Revlimid or medication expenses related to MM from 08/01/14 to 08/01/15 or when the benefit cap has been met.  Expenses can be submitted for reimbursement from 05/03/14 to 08/01/15.  Amount of grant is $10,000.  Will send copy of letter to [facility] in billing.

## 2014-08-07 ENCOUNTER — Ambulatory Visit: Payer: Self-pay

## 2014-08-08 ENCOUNTER — Encounter: Payer: Self-pay | Admitting: Family Medicine

## 2014-08-08 ENCOUNTER — Telehealth: Payer: Self-pay | Admitting: *Deleted

## 2014-08-08 ENCOUNTER — Ambulatory Visit (INDEPENDENT_AMBULATORY_CARE_PROVIDER_SITE_OTHER): Payer: Medicare Other | Admitting: Family Medicine

## 2014-08-08 VITALS — BP 140/90 | Temp 98.0°F | Wt 121.0 lb

## 2014-08-08 DIAGNOSIS — I10 Essential (primary) hypertension: Secondary | ICD-10-CM

## 2014-08-08 NOTE — Patient Instructions (Signed)
Increase to 40mg  of lisinopril.  Either see me in 2-4 weeks or if you have an infusion, call me and let me know what your blood pressure is. Such as next Thursday. Have them compare their reading with your cuff. You can send me a message through mychart.

## 2014-08-08 NOTE — Assessment & Plan Note (Signed)
BP much improved on lisinopril 20mg . DBP still elevated at 90. Patient without cough today. Does have mild cough at home. She has not noted any increase with lisinopril. I think if there is any question in the future, we should just switch her to an ARB such as losartan. Patient will let me know how things are going with BP checks at infusions to make decision on further changes-otherwise 6 month follow up.

## 2014-08-08 NOTE — Telephone Encounter (Signed)
08/08/14 at 9:44am - The research nurse called the pt this morning to check on her status.  The pt said that she has been feeling fine.  She did report experiencing some mild weakness starting on Sunday, 08/05/14.  She states she does have some ongoing weakness, but she is able to perform her usual tasks.  The pt requested that she have a bed during her infusion tomorrow.  The pt said that sitting in the chair for 6 hours last week was "miserable" for her.  The research nurse immediately contacted the infusion room charge nurse, Amy Horton, and requested that the pt be placed in a bed if there is availability since the pt's infusion is 5 hours tomorrow.  The charge nurse said that she would try to accommodate the pt's request.  The pt confirmed that she is taking her Revlimid every night at 10 pm.  The pt states she has her decadron pills.  She stated that she will take her decadron 28 mg dose ( decadron 4 mg pill - pt instructed to take 7 pills to =28mg ) between 9:00am-9:30am tomorrow with food.  The pt's infusion room appt is at 12:45pm.  The pt was informed that Marcellus Scott, RN and Raoul Pitch, RN will see her tomorrow in my absence.  The pt was very appreciative that she was called this morning.

## 2014-08-08 NOTE — Progress Notes (Signed)
[name], MD Phone: [phone]  Subjective:   Kendra Johns is a 63 y.o. year old very pleasant female patient who presents with the following:  Hypertension-poor control but much improved BP Readings from Last 3 Encounters:  08/08/14 140/90  08/02/14 127/79  08/02/14 171/108  Home BP monitoring-150-180s/100 typically but varies with level of pain Compliant with medications-yes without side effects, lisinopril 20mg  up from 10mg .  ROS-Denies any CP, HA, SOB, blurry vision, LE edema, transient weakness, orthopnea, PND.   Past Medical History-multiple myeloma, bony pain from multiple myeloma, Hep C, depression  Medications- reviewed and updated Current Outpatient Prescriptions  Medication Sig Dispense Refill  . aspirin 81 MG tablet Take 81 mg by mouth daily.      . Cholecalciferol (VITAMIN D-3) 5000 UNITS TABS Take 1 tablet by mouth every morning.      Marland Kitchen dexamethasone (DECADRON) 4 MG tablet Take as directed by chemotherapy protocol, 7 pills on the day of chemo  90 tablet  3  . lenalidomide (REVLIMID) 25 MG capsule Take 1 capsule (25 mg total) by mouth daily.  21 capsule  0  . lidocaine-prilocaine (EMLA) cream Apply 1 application topically as needed.  30 g  0  . lisinopril (PRINIVIL,ZESTRIL) 20 MG tablet Take 40 mg by mouth daily.       . magnesium hydroxide (MILK OF MAGNESIA) 400 MG/5ML suspension Take 30 mLs by mouth daily as needed for mild constipation.      Marland Kitchen morphine (MS CONTIN) 60 MG 12 hr tablet Take 60 mg by mouth every 12 (twelve) hours.      Marland Kitchen morphine (MSIR) 15 MG tablet Take 15 mg by mouth every 6 (six) hours as needed for severe pain.      Marland Kitchen prochlorperazine (COMPAZINE) 10 MG tablet Take 1 tablet (10 mg total) by mouth every 6 (six) hours as needed for nausea.  60 tablet  3  . albuterol (PROVENTIL HFA;VENTOLIN HFA) 108 (90 BASE) MCG/ACT inhaler Inhale 2 puffs into the lungs every 4 (four) hours as needed for wheezing or shortness of breath.  1 Inhaler  0  . Alum &  Mag Hydroxide-Simeth (MAGIC MOUTHWASH) SOLN Take 10 mLs by mouth 4 (four) times daily as needed for mouth pain. Swish and spit  Or  Swish and swallow.  300 mL  2  . [DISCONTINUED] amLODipine (NORVASC) 5 MG tablet Take 1 tablet (5 mg total) by mouth daily.  90 tablet  3  . [DISCONTINUED] gabapentin (NEURONTIN) 600 MG tablet Take 600 mg by mouth 3 (three) times daily.         No current facility-administered medications for this visit.   Facility-Administered Medications Ordered in Other Visits  Medication Dose Route Frequency Provider Last Rate Last Dose  . sodium chloride 0.9 % injection 10 mL  10 mL Intravenous PRN Heath Lark, MD   10 mL at 02/26/14 1425  . sodium chloride 0.9 % injection 10 mL  10 mL Intravenous PRN Chauncey Cruel, MD   10 mL at 04/05/14 1333    Objective: BP 140/90  Temp(Src) 98 F (36.7 C)  Wt 121 lb (54.885 kg) Gen: NAD CV: RRR no murmurs rubs or gallops Lungs: CTAB no crackles, wheeze, rhonchi Abdomen: soft/nontender/nondistended/normal bowel sounds.  Ext: no edema Skin: warm, dry, no rash Neuro: grossly normal, moves all extremities  Unchanged from last visit except BP  Assessment/Plan:  Essential hypertension BP much improved on lisinopril 20mg . DBP still elevated at 90. Patient without cough today. Does  have mild cough at home. She has not noted any increase with lisinopril. I think if there is any question in the future, we should just switch her to an ARB such as losartan. Patient will let me know how things are going with BP checks at infusions to make decision on further changes-otherwise 6 month follow up.

## 2014-08-09 ENCOUNTER — Ambulatory Visit (HOSPITAL_BASED_OUTPATIENT_CLINIC_OR_DEPARTMENT_OTHER): Payer: Medicare Other

## 2014-08-09 ENCOUNTER — Other Ambulatory Visit (HOSPITAL_BASED_OUTPATIENT_CLINIC_OR_DEPARTMENT_OTHER): Payer: Medicare Other

## 2014-08-09 ENCOUNTER — Encounter: Payer: Self-pay | Admitting: *Deleted

## 2014-08-09 VITALS — BP 127/88 | HR 82 | Temp 98.0°F | Resp 18

## 2014-08-09 DIAGNOSIS — Z5112 Encounter for antineoplastic immunotherapy: Secondary | ICD-10-CM

## 2014-08-09 DIAGNOSIS — I10 Essential (primary) hypertension: Secondary | ICD-10-CM

## 2014-08-09 DIAGNOSIS — C9000 Multiple myeloma not having achieved remission: Secondary | ICD-10-CM

## 2014-08-09 LAB — CBC WITH DIFFERENTIAL/PLATELET
BASO%: 0.2 % (ref 0.0–2.0)
Basophils Absolute: 0 10*3/uL (ref 0.0–0.1)
EOS%: 1.4 % (ref 0.0–7.0)
Eosinophils Absolute: 0.1 10*3/uL (ref 0.0–0.5)
HCT: 47.4 % — ABNORMAL HIGH (ref 34.8–46.6)
HGB: 15.5 g/dL (ref 11.6–15.9)
LYMPH#: 0.4 10*3/uL — AB (ref 0.9–3.3)
LYMPH%: 4.2 % — ABNORMAL LOW (ref 14.0–49.7)
MCH: 30.4 pg (ref 25.1–34.0)
MCHC: 32.8 g/dL (ref 31.5–36.0)
MCV: 92.7 fL (ref 79.5–101.0)
MONO#: 0.3 10*3/uL (ref 0.1–0.9)
MONO%: 2.7 % (ref 0.0–14.0)
NEUT#: 9.8 10*3/uL — ABNORMAL HIGH (ref 1.5–6.5)
NEUT%: 91.5 % — ABNORMAL HIGH (ref 38.4–76.8)
Platelets: 157 10*3/uL (ref 145–400)
RBC: 5.12 10*6/uL (ref 3.70–5.45)
RDW: 13.6 % (ref 11.2–14.5)
WBC: 10.7 10*3/uL — AB (ref 3.9–10.3)

## 2014-08-09 LAB — COMPREHENSIVE METABOLIC PANEL (CC13)
ALT: 15 U/L (ref 0–55)
AST: 17 U/L (ref 5–34)
Albumin: 3.7 g/dL (ref 3.5–5.0)
Alkaline Phosphatase: 88 U/L (ref 40–150)
Anion Gap: 8 mEq/L (ref 3–11)
BUN: 6 mg/dL — ABNORMAL LOW (ref 7.0–26.0)
CO2: 27 mEq/L (ref 22–29)
Calcium: 10.3 mg/dL (ref 8.4–10.4)
Chloride: 106 mEq/L (ref 98–109)
Creatinine: 0.8 mg/dL (ref 0.6–1.1)
Glucose: 137 mg/dl (ref 70–140)
POTASSIUM: 4.2 meq/L (ref 3.5–5.1)
SODIUM: 142 meq/L (ref 136–145)
TOTAL PROTEIN: 6.9 g/dL (ref 6.4–8.3)
Total Bilirubin: 0.59 mg/dL (ref 0.20–1.20)

## 2014-08-09 LAB — TECHNOLOGIST REVIEW

## 2014-08-09 MED ORDER — DEXAMETHASONE SODIUM PHOSPHATE 10 MG/ML IJ SOLN
INTRAMUSCULAR | Status: AC
Start: 2014-08-09 — End: 2014-08-09
  Filled 2014-08-09: qty 1

## 2014-08-09 MED ORDER — FAMOTIDINE IN NACL 20-0.9 MG/50ML-% IV SOLN
20.0000 mg | Freq: Once | INTRAVENOUS | Status: AC
  Administered 2014-08-09: 20 mg via INTRAVENOUS

## 2014-08-09 MED ORDER — DIPHENHYDRAMINE HCL 25 MG PO CAPS
ORAL_CAPSULE | ORAL | Status: AC
  Filled 2014-08-09: qty 1

## 2014-08-09 MED ORDER — SODIUM CHLORIDE 0.9 % IV SOLN
Freq: Once | INTRAVENOUS | Status: AC
  Administered 2014-08-09: 13:00:00 via INTRAVENOUS

## 2014-08-09 MED ORDER — SODIUM CHLORIDE 0.9 % IJ SOLN
10.0000 mL | INTRAMUSCULAR | Status: DC | PRN
  Administered 2014-08-09: 10 mL
  Filled 2014-08-09: qty 10

## 2014-08-09 MED ORDER — DIPHENHYDRAMINE HCL 25 MG PO CAPS
50.0000 mg | ORAL_CAPSULE | Freq: Once | ORAL | Status: AC
  Administered 2014-08-09: 50 mg via ORAL

## 2014-08-09 MED ORDER — ACETAMINOPHEN 325 MG PO TABS
650.0000 mg | ORAL_TABLET | Freq: Once | ORAL | Status: AC
  Administered 2014-08-09: 650 mg via ORAL

## 2014-08-09 MED ORDER — FAMOTIDINE IN NACL 20-0.9 MG/50ML-% IV SOLN
INTRAVENOUS | Status: AC
  Filled 2014-08-09: qty 50

## 2014-08-09 MED ORDER — SODIUM CHLORIDE 0.9 % IV SOLN
10.0000 mg/kg | Freq: Once | INTRAVENOUS | Status: AC
  Administered 2014-08-09: 545 mg via INTRAVENOUS
  Filled 2014-08-09: qty 21.8

## 2014-08-09 MED ORDER — DEXAMETHASONE SODIUM PHOSPHATE 10 MG/ML IJ SOLN
8.0000 mg | Freq: Once | INTRAMUSCULAR | Status: AC
  Administered 2014-08-09: 8 mg via INTRAVENOUS

## 2014-08-09 MED ORDER — ACETAMINOPHEN 325 MG PO TABS
ORAL_TABLET | ORAL | Status: AC
  Filled 2014-08-09: qty 2

## 2014-08-09 MED ORDER — HEPARIN SOD (PORK) LOCK FLUSH 100 UNIT/ML IV SOLN
500.0000 [IU] | Freq: Once | INTRAVENOUS | Status: AC | PRN
  Administered 2014-08-09: 500 [IU]
  Filled 2014-08-09: qty 5

## 2014-08-09 NOTE — Progress Notes (Signed)
08/09/2014 1225 08/02/14 BMS [id number], Cycle 1, Day 8: Kendra Johns is here to receive the second treatment with elotuzumab.  She reported having taken dexamethasone 28 mg at 8:30 am this morning with food. She reports no side effects since last week and said she is still the same (baseline).  She stated she has been taking Lenalidomide as directed  and following the dosing calendar that she was given by Doristine Johns, Research Nurse.  She stated she has not missed a dose.    Kit assignments for Elotuzumab were made through [employer], the [employer], and taken to the pharmacy.  Spoke at length with Marzetta Board, RN  in the infusion room about the timing and delivery of elotuzumab. Provided RN with written instructions on the titration of elotuzumab on Day 8 as described in the protocol and requested documentation of each increase of the drug in the EMR. Emphasized the timing of the premedications with regard to the start of elotuzumab and the requirement of administering through a 0.22 micron filter. Also, provided the nurse with guidance on grading a transfusion reaction and recommendations for managing an infusion reaction. Requested that they call me should the patient experience any grade of infusion reaction. RN agreed to all follow all instructions.  Patient expressed appreciation for the visit and denied having any questions re: her calendar or treatment regimen.

## 2014-08-09 NOTE — Patient Instructions (Signed)
[facility] Discharge Instructions for Patients Receiving Chemotherapy  Today you received the following chemotherapy agents Elotuzumab.  To help prevent nausea and vomiting after your treatment, we encourage you to take your nausea medication as prescribed.   If you develop nausea and vomiting that is not controlled by your nausea medication, call the clinic.   BELOW ARE SYMPTOMS THAT SHOULD BE REPORTED IMMEDIATELY:  *FEVER GREATER THAN 100.5 F  *CHILLS WITH OR WITHOUT FEVER  NAUSEA AND VOMITING THAT IS NOT CONTROLLED WITH YOUR NAUSEA MEDICATION  *UNUSUAL SHORTNESS OF BREATH  *UNUSUAL BRUISING OR BLEEDING  TENDERNESS IN MOUTH AND THROAT WITH OR WITHOUT PRESENCE OF ULCERS  *URINARY PROBLEMS  *BOWEL PROBLEMS  UNUSUAL RASH Items with * indicate a potential emergency and should be followed up as soon as possible.  Feel free to call the clinic you have any questions or concerns. The clinic phone number is [phone].    

## 2014-08-15 ENCOUNTER — Telehealth: Payer: Self-pay | Admitting: *Deleted

## 2014-08-15 NOTE — Telephone Encounter (Signed)
08/16/14 at 9:15am - The research nurse called the pt to check on her status.  The pt said that she is tolerating her treatment well.  She reports some weakness.  She states she is performing her usual activities.  The pt confirmed that she is taking her Revlimid every night.  The pt was reminded that she will need to take her decadron dose (28 mg) with food between 9:30am-10 am before coming to the cancer center at 12:30p.  The pt verbalized understanding.  The pt was thanked for continued compliance and support of this clinical trial. The pt will be seen and examined tomorrow by Dr. Alvy Bimler.

## 2014-08-16 ENCOUNTER — Other Ambulatory Visit (HOSPITAL_BASED_OUTPATIENT_CLINIC_OR_DEPARTMENT_OTHER): Payer: Medicare Other

## 2014-08-16 ENCOUNTER — Ambulatory Visit (HOSPITAL_BASED_OUTPATIENT_CLINIC_OR_DEPARTMENT_OTHER): Payer: Medicare Other

## 2014-08-16 ENCOUNTER — Telehealth: Payer: Self-pay | Admitting: *Deleted

## 2014-08-16 ENCOUNTER — Telehealth: Payer: Self-pay | Admitting: Hematology and Oncology

## 2014-08-16 ENCOUNTER — Ambulatory Visit (HOSPITAL_BASED_OUTPATIENT_CLINIC_OR_DEPARTMENT_OTHER): Payer: Medicare Other | Admitting: Hematology and Oncology

## 2014-08-16 ENCOUNTER — Encounter: Payer: Self-pay | Admitting: Hematology and Oncology

## 2014-08-16 ENCOUNTER — Encounter: Payer: Self-pay | Admitting: *Deleted

## 2014-08-16 VITALS — BP 105/80 | HR 88 | Temp 98.2°F | Resp 18

## 2014-08-16 VITALS — BP 98/84 | HR 92 | Temp 98.0°F | Resp 18 | Ht 64.0 in | Wt 116.5 lb

## 2014-08-16 DIAGNOSIS — E860 Dehydration: Secondary | ICD-10-CM

## 2014-08-16 DIAGNOSIS — I10 Essential (primary) hypertension: Secondary | ICD-10-CM

## 2014-08-16 DIAGNOSIS — F17200 Nicotine dependence, unspecified, uncomplicated: Secondary | ICD-10-CM

## 2014-08-16 DIAGNOSIS — C9000 Multiple myeloma not having achieved remission: Secondary | ICD-10-CM

## 2014-08-16 DIAGNOSIS — Z720 Tobacco use: Secondary | ICD-10-CM

## 2014-08-16 DIAGNOSIS — K1231 Oral mucositis (ulcerative) due to antineoplastic therapy: Secondary | ICD-10-CM | POA: Insufficient documentation

## 2014-08-16 DIAGNOSIS — Z5112 Encounter for antineoplastic immunotherapy: Secondary | ICD-10-CM

## 2014-08-16 LAB — COMPREHENSIVE METABOLIC PANEL (CC13)
ALT: 17 U/L (ref 0–55)
ANION GAP: 14 meq/L — AB (ref 3–11)
AST: 18 U/L (ref 5–34)
Albumin: 3.9 g/dL (ref 3.5–5.0)
Alkaline Phosphatase: 102 U/L (ref 40–150)
BUN: 17.1 mg/dL (ref 7.0–26.0)
CO2: 21 meq/L — AB (ref 22–29)
CREATININE: 1.2 mg/dL — AB (ref 0.6–1.1)
Calcium: 10.3 mg/dL (ref 8.4–10.4)
Chloride: 104 mEq/L (ref 98–109)
Glucose: 140 mg/dl (ref 70–140)
Potassium: 3.4 mEq/L — ABNORMAL LOW (ref 3.5–5.1)
SODIUM: 139 meq/L (ref 136–145)
TOTAL PROTEIN: 8 g/dL (ref 6.4–8.3)
Total Bilirubin: 0.96 mg/dL (ref 0.20–1.20)

## 2014-08-16 LAB — CBC WITH DIFFERENTIAL/PLATELET
BASO%: 0.5 % (ref 0.0–2.0)
Basophils Absolute: 0 10*3/uL (ref 0.0–0.1)
EOS%: 1.5 % (ref 0.0–7.0)
Eosinophils Absolute: 0.1 10*3/uL (ref 0.0–0.5)
HCT: 54.2 % — ABNORMAL HIGH (ref 34.8–46.6)
HEMOGLOBIN: 17.6 g/dL — AB (ref 11.6–15.9)
LYMPH#: 0.8 10*3/uL — AB (ref 0.9–3.3)
LYMPH%: 7.6 % — ABNORMAL LOW (ref 14.0–49.7)
MCH: 30 pg (ref 25.1–34.0)
MCHC: 32.6 g/dL (ref 31.5–36.0)
MCV: 92.1 fL (ref 79.5–101.0)
MONO#: 0.3 10*3/uL (ref 0.1–0.9)
MONO%: 3 % (ref 0.0–14.0)
NEUT#: 8.7 10*3/uL — ABNORMAL HIGH (ref 1.5–6.5)
NEUT%: 87.4 % — ABNORMAL HIGH (ref 38.4–76.8)
Platelets: 210 10*3/uL (ref 145–400)
RBC: 5.88 10*6/uL — ABNORMAL HIGH (ref 3.70–5.45)
RDW: 13.6 % (ref 11.2–14.5)
WBC: 10 10*3/uL (ref 3.9–10.3)

## 2014-08-16 LAB — TECHNOLOGIST REVIEW

## 2014-08-16 MED ORDER — ACETAMINOPHEN 325 MG PO TABS
650.0000 mg | ORAL_TABLET | Freq: Once | ORAL | Status: AC
  Administered 2014-08-16: 650 mg via ORAL

## 2014-08-16 MED ORDER — SODIUM CHLORIDE 0.9 % IV SOLN
Freq: Once | INTRAVENOUS | Status: AC
  Administered 2014-08-16: 15:00:00 via INTRAVENOUS

## 2014-08-16 MED ORDER — DEXAMETHASONE SODIUM PHOSPHATE 10 MG/ML IJ SOLN
8.0000 mg | Freq: Once | INTRAMUSCULAR | Status: AC
  Administered 2014-08-16: 15:00:00 via INTRAVENOUS

## 2014-08-16 MED ORDER — HEPARIN SOD (PORK) LOCK FLUSH 100 UNIT/ML IV SOLN
500.0000 [IU] | Freq: Once | INTRAVENOUS | Status: AC | PRN
  Administered 2014-08-16: 500 [IU]
  Filled 2014-08-16: qty 5

## 2014-08-16 MED ORDER — ACETAMINOPHEN 325 MG PO TABS
ORAL_TABLET | ORAL | Status: AC
  Filled 2014-08-16: qty 2

## 2014-08-16 MED ORDER — SODIUM CHLORIDE 0.9 % IJ SOLN
10.0000 mL | INTRAMUSCULAR | Status: DC | PRN
  Administered 2014-08-16: 10 mL
  Filled 2014-08-16: qty 10

## 2014-08-16 MED ORDER — DIPHENHYDRAMINE HCL 25 MG PO CAPS
ORAL_CAPSULE | ORAL | Status: AC
  Filled 2014-08-16: qty 2

## 2014-08-16 MED ORDER — LIDOCAINE-PRILOCAINE 2.5-2.5 % EX CREA: 1.0000 "application " | TOPICAL_CREAM | CUTANEOUS | Status: DC | PRN

## 2014-08-16 MED ORDER — FAMOTIDINE IN NACL 20-0.9 MG/50ML-% IV SOLN
20.0000 mg | Freq: Once | INTRAVENOUS | Status: AC
  Administered 2014-08-16: 20 mg via INTRAVENOUS

## 2014-08-16 MED ORDER — DEXAMETHASONE SODIUM PHOSPHATE 10 MG/ML IJ SOLN
INTRAMUSCULAR | Status: AC
  Filled 2014-08-16: qty 1

## 2014-08-16 MED ORDER — FAMOTIDINE IN NACL 20-0.9 MG/50ML-% IV SOLN
INTRAVENOUS | Status: AC
  Filled 2014-08-16: qty 50

## 2014-08-16 MED ORDER — SODIUM CHLORIDE 0.9 % IV SOLN
10.0000 mg/kg | Freq: Once | INTRAVENOUS | Status: AC
  Administered 2014-08-16: 545 mg via INTRAVENOUS
  Filled 2014-08-16: qty 21.8

## 2014-08-16 MED ORDER — DIPHENHYDRAMINE HCL 25 MG PO CAPS
50.0000 mg | ORAL_CAPSULE | Freq: Once | ORAL | Status: AC
  Administered 2014-08-16: 50 mg via ORAL

## 2014-08-16 NOTE — Assessment & Plan Note (Signed)
I spent some time counseling the patient the importance of tobacco cessation. She is currently not interested to quit now. 

## 2014-08-16 NOTE — Patient Instructions (Signed)
[facility] Discharge Instructions for Patients Receiving Chemotherapy  Today you received the following chemotherapy agents elotuzumab  To help prevent nausea and vomiting after your treatment, we encourage you to take your nausea medication as directed   If you develop nausea and vomiting that is not controlled by your nausea medication, call the clinic.   BELOW ARE SYMPTOMS THAT SHOULD BE REPORTED IMMEDIATELY:  *FEVER GREATER THAN 100.5 F  *CHILLS WITH OR WITHOUT FEVER  NAUSEA AND VOMITING THAT IS NOT CONTROLLED WITH YOUR NAUSEA MEDICATION  *UNUSUAL SHORTNESS OF BREATH  *UNUSUAL BRUISING OR BLEEDING  TENDERNESS IN MOUTH AND THROAT WITH OR WITHOUT PRESENCE OF ULCERS  *URINARY PROBLEMS  *BOWEL PROBLEMS  UNUSUAL RASH Items with * indicate a potential emergency and should be followed up as soon as possible.  Feel free to call the clinic you have any questions or concerns. The clinic phone number is [phone].  

## 2014-08-16 NOTE — Assessment & Plan Note (Signed)
Recommend she takes Magic mouthwash.

## 2014-08-16 NOTE — Assessment & Plan Note (Signed)
Clinically, she responded well to treatment with some mild improvement of pain control. I will continue treatment with a dose adjustment. The patient is not on IV bisphosphonates due to poor dentition

## 2014-08-16 NOTE — Assessment & Plan Note (Signed)
Medically, she appeared to be mildly dehydrated. Recommend increase oral fluid intake. I suspect it might be a component of mucositis.

## 2014-08-16 NOTE — Telephone Encounter (Signed)
Pt confirmed labs/ov per 10/15 POF, gave pt AVS..... KJ

## 2014-08-16 NOTE — Progress Notes (Signed)
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   01/03/2007 -  Radiation Therapy Approximate date only, received RT for plasmacytoma followed by surgery   09/14/2007 Initial Diagnosis MULTIPLE  MYELOMA   10/05/2007 Bone Marrow Biopsy Bm biopsy was negative   06/18/2008 Bone Marrow Biopsy BM biopsy was negative   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 [facility]   04/13/2011 Relapse/Recurrence Disease relapse   04/14/2011 Bone Marrow Biopsy Bm biopsy showed 2 % plasma cell   04/27/2011 Relapse/Recurrence Disease relapse, treated with Velcade/Cytoxan/Dex   09/07/2011 - 07/28/2013 Chemotherapy She has been receiving Velcade   12/27/2011 Bone Marrow Transplant 2nd transplant at [facility]   07/28/2013 Relapse/Recurrence Chemo is stopped due to progression of disease   08/29/2013 Imaging PEt/CT showed recurrence of disease with new lesion on her rib with compression fracture   09/13/2013 Bone Marrow Biopsy BM biopsy is hypercellular with 6% plasma cell   10/10/2013 - 10/20/2013 Radiation Therapy Started on palliative XRT for rib pain   11/27/2013 - 04/06/2014 Chemotherapy The patient starts chemotherapy with Carfilzomib   07/19/2014 Imaging Repeat bloodwork and PET/CT scan shows significant disease progression.   08/02/2014 -  Chemotherapy She enrolled in clinical trial using combination therapy with Revlimid, dexamethasone and  Elotuzumab    INTERVAL HISTORY: Please see below for problem oriented charting. She tolerated treatment well apart from mild fatigue and mild mucositis. Denies dysphagia.  REVIEW OF SYSTEMS:   Constitutional: Denies fevers, chills Eyes: Denies blurriness of vision Respiratory: Denies cough, dyspnea or wheezes Cardiovascular: Denies palpitation, chest discomfort or lower extremity swelling Gastrointestinal:  Denies nausea, heartburn or change in bowel habits Skin: Denies abnormal skin rashes Lymphatics: Denies new lymphadenopathy or easy bruising Neurological:Denies numbness, tingling or new weaknesses Behavioral/Psych: Mood is stable, no new changes  All other systems were reviewed with the patient and are negative.  I have reviewed the past medical history, past surgical history, social history and family history with the patient and they are unchanged from previous note.  ALLERGIES:  is allergic to codeine and ibuprofen.  MEDICATIONS:  Current Outpatient Prescriptions  Medication Sig Dispense Refill  . albuterol (PROVENTIL HFA;VENTOLIN HFA) 108 (90 BASE) MCG/ACT inhaler Inhale 2 puffs into the lungs every 4 (four) hours as needed for wheezing or shortness of breath.  1 Inhaler  0  . Alum & Mag Hydroxide-Simeth (MAGIC MOUTHWASH) SOLN Take 10 mLs by mouth 4 (four) times daily as needed for mouth pain. Swish and spit  Or  Swish and swallow.  300 mL  2  . aspirin 81 MG tablet Take 81 mg by mouth daily.      . Cholecalciferol (VITAMIN D-3) 5000 UNITS TABS Take 1 tablet by mouth every morning.      Marland Kitchen dexamethasone (DECADRON) 4 MG tablet Take as directed by chemotherapy protocol, 7 pills on the day of chemo  90 tablet  3  . lenalidomide (REVLIMID) 25 MG capsule Take 1 capsule (25  mg total) by mouth daily.  21 capsule  0  . lidocaine-prilocaine (EMLA) cream Apply 1 application topically as needed.  30 g  0  . lisinopril (PRINIVIL,ZESTRIL) 20 MG tablet Take 40 mg by mouth daily.       .  magnesium hydroxide (MILK OF MAGNESIA) 400 MG/5ML suspension Take 30 mLs by mouth daily as needed for mild constipation.      Marland Kitchen morphine (MS CONTIN) 60 MG 12 hr tablet Take 60 mg by mouth every 12 (twelve) hours.      Marland Kitchen morphine (MSIR) 15 MG tablet Take 15 mg by mouth every 6 (six) hours as needed for severe pain.      Marland Kitchen prochlorperazine (COMPAZINE) 10 MG tablet Take 1 tablet (10 mg total) by mouth every 6 (six) hours as needed for nausea.  60 tablet  3  . lidocaine-prilocaine (EMLA) cream Apply 1 application topically as needed.  30 g  6  . [DISCONTINUED] amLODipine (NORVASC) 5 MG tablet Take 1 tablet (5 mg total) by mouth daily.  90 tablet  3  . [DISCONTINUED] gabapentin (NEURONTIN) 600 MG tablet Take 600 mg by mouth 3 (three) times daily.         No current facility-administered medications for this visit.   Facility-Administered Medications Ordered in Other Visits  Medication Dose Route Frequency Provider Last Rate Last Dose  . sodium chloride 0.9 % injection 10 mL  10 mL Intravenous PRN Heath Lark, MD   10 mL at 02/26/14 1425  . sodium chloride 0.9 % injection 10 mL  10 mL Intravenous PRN Chauncey Cruel, MD   10 mL at 04/05/14 1333    PHYSICAL EXAMINATION: ECOG PERFORMANCE STATUS: 1 - Symptomatic but completely ambulatory  Filed Vitals:   08/16/14 1256  BP: 98/84  Pulse: 92  Temp: 98 F (36.7 C)  Resp: 18   Filed Weights   08/16/14 1256  Weight: 116 lb 8 oz (52.844 kg)    GENERAL:alert, no distress and comfortable as she looks thin and mildly cachectic  SKIN: skin color, texture, turgor are normal, no rashes or significant lesions EYES: normal, Conjunctiva are pink and non-injected, sclera clear OROPHARYNX: She has dry mucous membranes and mild mucositis, no thrush NECK: supple, thyroid normal size, non-tender, without nodularity LYMPH:  no palpable lymphadenopathy in the cervical, axillary or inguinal LUNGS: clear to auscultation and percussion with normal breathing  effort HEART: regular rate & rhythm and no murmurs and no lower extremity edema ABDOMEN:abdomen soft, non-tender and normal bowel sounds Musculoskeletal:no cyanosis of digits and no clubbing  NEURO: alert & oriented x 3 with fluent speech, no focal motor/sensory deficits  LABORATORY DATA:  I have reviewed the data as listed    Component Value Date/Time   NA 139 08/16/2014 1241   NA 138 10/06/2013 1920   K 3.4* 08/16/2014 1241   K 4.3 10/06/2013 1920   CL 103 10/06/2013 1920   CL 107 03/10/2013 1014   CO2 21* 08/16/2014 1241   CO2 25 10/06/2013 1920   GLUCOSE 140 08/16/2014 1241   GLUCOSE 99 10/06/2013 1920   GLUCOSE 111* 03/10/2013 1014   GLUCOSE 98 08/31/2006 1024   BUN 17.1 08/16/2014 1241   BUN 10 10/06/2013 1920   CREATININE 1.2* 08/16/2014 1241   CREATININE 0.63 10/06/2013 1920   CALCIUM 10.3 08/16/2014 1241   CALCIUM 9.8 10/06/2013 1920   PROT 8.0 08/16/2014 1241   PROT 7.2 10/06/2013 1920   ALBUMIN 3.9 08/16/2014 1241   ALBUMIN  3.7 10/06/2013 1920   AST 18 08/16/2014 1241   AST 16 10/06/2013 1920   ALT 17 08/16/2014 1241   ALT 11 10/06/2013 1920   ALKPHOS 102 08/16/2014 1241   ALKPHOS 100 10/06/2013 1920   BILITOT 0.96 08/16/2014 1241   BILITOT 0.3 10/06/2013 1920   GFRNONAA >90 10/06/2013 1920   GFRAA >90 10/06/2013 1920    No results found for this basename: SPEP, UPEP,  kappa and lambda light chains    Lab Results  Component Value Date   WBC 10.0 08/16/2014   NEUTROABS 8.7* 08/16/2014   HGB 17.6* 08/16/2014   HCT 54.2* 08/16/2014   MCV 92.1 08/16/2014   PLT 210 08/16/2014      Chemistry      Component Value Date/Time   NA 139 08/16/2014 1241   NA 138 10/06/2013 1920   K 3.4* 08/16/2014 1241   K 4.3 10/06/2013 1920   CL 103 10/06/2013 1920   CL 107 03/10/2013 1014   CO2 21* 08/16/2014 1241   CO2 25 10/06/2013 1920   BUN 17.1 08/16/2014 1241   BUN 10 10/06/2013 1920   CREATININE 1.2* 08/16/2014 1241   CREATININE 0.63 10/06/2013 1920      Component Value Date/Time    CALCIUM 10.3 08/16/2014 1241   CALCIUM 9.8 10/06/2013 1920   ALKPHOS 102 08/16/2014 1241   ALKPHOS 100 10/06/2013 1920   AST 18 08/16/2014 1241   AST 16 10/06/2013 1920   ALT 17 08/16/2014 1241   ALT 11 10/06/2013 1920   BILITOT 0.96 08/16/2014 1241   BILITOT 0.3 10/06/2013 1920     ASSESSMENT & PLAN:  Multiple myeloma Clinically, she responded well to treatment with some mild improvement of pain control. I will continue treatment with a dose adjustment. The patient is not on IV bisphosphonates due to poor dentition  Smoker I spent some time counseling the patient the importance of tobacco cessation. She is currently not interested to quit now.     Essential hypertension The blood pressure is under good control today.  Dehydration Medically, she appeared to be mildly dehydrated. Recommend increase oral fluid intake. I suspect it might be a component of mucositis.  Mucositis due to chemotherapy Recommend she takes Magic mouthwash.   No orders of the defined types were placed in this encounter.   All questions were answered. The patient knows to call the clinic with any problems, questions or concerns. No barriers to learning was detected. I spent 30 minutes counseling the patient face to face. The total time spent in the appointment was 40 minutes and more than 50% was on counseling and review of test results     [facility], [place], MD 08/16/2014 8:43 PM

## 2014-08-16 NOTE — Telephone Encounter (Signed)
Per staff message and POF I have scheduled appts. Advised scheduler of appts. JMW  

## 2014-08-16 NOTE — Progress Notes (Signed)
08/16/14 at 2:01pm - [facility] [id number]- cycle 1, day 15 study notes- The pt was into the cancer center this afternoon for her cycle 1, day 15 assessments and treatment.  Dr. Gorsuch reviewed the pt's labs, and she felt the pt's labs were acceptable for treatment.  She felt the pt was dehydrated, and she encouraged the pt to increase her fluid intake.  The pt was seen and examined today by Dr. Gorsuch.  The pt reports some fatigue and weight loss.  The pt's tongue revealed a grade 1 mucositis.  The pt denies any new medications.  The pt confirmed that she took her decadron 28 mg this morning with food prior to coming to the cancer center.  The pt states she is taking her Revlimid every night.  The pt's kit assignment for elotuzumab was made through [facility], the [facility], and taken to the pharmacy.  The research nurse spoke to Ellen, infusion room nurse, regarding the pt's study drug administration.  The research nurse provided written instructions on the timing of the premedications and the rate of the study drug administration to the infusion nurse. The research nurse emphasized the timing of the premedications with regard to the start of elotuzumab and the requirement of administering through a 0.22 micron filter. Also, provided the nurse with guidance on grading a transfusion reaction and recommendations for managing an infusion reaction. Requested that they call the research department should the patient experience any grade of infusion reaction. RN agreed to all follow all instructions. Patient denied having any questions re: her calendar or treatment regimen. The pt was given her appointments for October and November.    

## 2014-08-16 NOTE — Assessment & Plan Note (Signed)
The blood pressure is under good control today.

## 2014-08-17 ENCOUNTER — Other Ambulatory Visit: Payer: Self-pay

## 2014-08-22 ENCOUNTER — Telehealth: Payer: Self-pay | Admitting: *Deleted

## 2014-08-22 NOTE — Telephone Encounter (Signed)
08/22/14 at 10:49am - The research nurse called the pt to check on her status.  The pt denied any new problems.  The pt still reports a decreased appetite.  She states she is drinking some nutritional supplements throughout the day.  The pt was reminded that she will need to take her oral decadron dose (28 mg) tomorrow morning at 10 am with food.  The pt verified that she had enough decadron pills at home.  The pt stated she is taking her Revlimid every night.  The pt had no questions/concerns about her current treatment plan.  The pt was thanked for her continued support of this clinical trial.

## 2014-08-23 ENCOUNTER — Other Ambulatory Visit (HOSPITAL_BASED_OUTPATIENT_CLINIC_OR_DEPARTMENT_OTHER): Payer: Medicare Other

## 2014-08-23 ENCOUNTER — Other Ambulatory Visit: Payer: Self-pay | Admitting: Hematology and Oncology

## 2014-08-23 ENCOUNTER — Ambulatory Visit (HOSPITAL_BASED_OUTPATIENT_CLINIC_OR_DEPARTMENT_OTHER): Payer: Medicare Other

## 2014-08-23 ENCOUNTER — Other Ambulatory Visit: Payer: Self-pay | Admitting: *Deleted

## 2014-08-23 ENCOUNTER — Encounter: Payer: Self-pay | Admitting: *Deleted

## 2014-08-23 VITALS — BP 96/59 | HR 80 | Temp 97.7°F

## 2014-08-23 DIAGNOSIS — C9000 Multiple myeloma not having achieved remission: Secondary | ICD-10-CM

## 2014-08-23 DIAGNOSIS — Z5112 Encounter for antineoplastic immunotherapy: Secondary | ICD-10-CM

## 2014-08-23 DIAGNOSIS — I10 Essential (primary) hypertension: Secondary | ICD-10-CM

## 2014-08-23 LAB — CBC WITH DIFFERENTIAL/PLATELET
BASO%: 0.7 % (ref 0.0–2.0)
Basophils Absolute: 0.1 10*3/uL (ref 0.0–0.1)
EOS ABS: 0.2 10*3/uL (ref 0.0–0.5)
EOS%: 3.2 % (ref 0.0–7.0)
HEMATOCRIT: 49.7 % — AB (ref 34.8–46.6)
HGB: 16.4 g/dL — ABNORMAL HIGH (ref 11.6–15.9)
LYMPH%: 7.1 % — AB (ref 14.0–49.7)
MCH: 30.2 pg (ref 25.1–34.0)
MCHC: 33.1 g/dL (ref 31.5–36.0)
MCV: 91.2 fL (ref 79.5–101.0)
MONO#: 0.4 10*3/uL (ref 0.1–0.9)
MONO%: 5.9 % (ref 0.0–14.0)
NEUT%: 83.1 % — AB (ref 38.4–76.8)
NEUTROS ABS: 6 10*3/uL (ref 1.5–6.5)
PLATELETS: 235 10*3/uL (ref 145–400)
RBC: 5.44 10*6/uL (ref 3.70–5.45)
RDW: 13.8 % (ref 11.2–14.5)
WBC: 7.3 10*3/uL (ref 3.9–10.3)
lymph#: 0.5 10*3/uL — ABNORMAL LOW (ref 0.9–3.3)

## 2014-08-23 LAB — COMPREHENSIVE METABOLIC PANEL (CC13)
ALK PHOS: 102 U/L (ref 40–150)
ALT: 17 U/L (ref 0–55)
AST: 13 U/L (ref 5–34)
Albumin: 3.4 g/dL — ABNORMAL LOW (ref 3.5–5.0)
Anion Gap: 10 mEq/L (ref 3–11)
BUN: 13.5 mg/dL (ref 7.0–26.0)
CO2: 22 mEq/L (ref 22–29)
Calcium: 9.9 mg/dL (ref 8.4–10.4)
Chloride: 105 mEq/L (ref 98–109)
Creatinine: 0.8 mg/dL (ref 0.6–1.1)
GLUCOSE: 184 mg/dL — AB (ref 70–140)
Potassium: 4 mEq/L (ref 3.5–5.1)
SODIUM: 137 meq/L (ref 136–145)
Total Bilirubin: 0.57 mg/dL (ref 0.20–1.20)
Total Protein: 7 g/dL (ref 6.4–8.3)

## 2014-08-23 LAB — TECHNOLOGIST REVIEW

## 2014-08-23 MED ORDER — SODIUM CHLORIDE 0.9 % IV SOLN
Freq: Once | INTRAVENOUS | Status: AC
  Administered 2014-08-23: 13:00:00 via INTRAVENOUS

## 2014-08-23 MED ORDER — ACETAMINOPHEN 325 MG PO TABS
ORAL_TABLET | ORAL | Status: AC
  Filled 2014-08-23: qty 2

## 2014-08-23 MED ORDER — DEXAMETHASONE SODIUM PHOSPHATE 10 MG/ML IJ SOLN
8.0000 mg | Freq: Once | INTRAMUSCULAR | Status: AC
  Administered 2014-08-23: 13:00:00 via INTRAVENOUS

## 2014-08-23 MED ORDER — DIPHENHYDRAMINE HCL 25 MG PO CAPS
ORAL_CAPSULE | ORAL | Status: AC
  Filled 2014-08-23: qty 2

## 2014-08-23 MED ORDER — FAMOTIDINE IN NACL 20-0.9 MG/50ML-% IV SOLN
INTRAVENOUS | Status: AC
  Filled 2014-08-23: qty 50

## 2014-08-23 MED ORDER — HEPARIN SOD (PORK) LOCK FLUSH 100 UNIT/ML IV SOLN
500.0000 [IU] | Freq: Once | INTRAVENOUS | Status: AC | PRN
  Filled 2014-08-23: qty 5

## 2014-08-23 MED ORDER — SODIUM CHLORIDE 0.9 % IJ SOLN
10.0000 mL | INTRAMUSCULAR | Status: DC | PRN
  Filled 2014-08-23: qty 10

## 2014-08-23 MED ORDER — SODIUM CHLORIDE 0.9 % IV SOLN
10.0000 mg/kg | Freq: Once | INTRAVENOUS | Status: AC
  Administered 2014-08-23: 545 mg via INTRAVENOUS
  Filled 2014-08-23: qty 21.8

## 2014-08-23 MED ORDER — LENALIDOMIDE 25 MG PO CAPS: ORAL_CAPSULE | ORAL | Status: DC

## 2014-08-23 MED ORDER — FAMOTIDINE IN NACL 20-0.9 MG/50ML-% IV SOLN
20.0000 mg | Freq: Once | INTRAVENOUS | Status: AC
  Administered 2014-08-23: 20 mg via INTRAVENOUS

## 2014-08-23 MED ORDER — ACETAMINOPHEN 325 MG PO TABS
650.0000 mg | ORAL_TABLET | Freq: Once | ORAL | Status: AC
  Administered 2014-08-23: 650 mg via ORAL

## 2014-08-23 MED ORDER — DEXAMETHASONE SODIUM PHOSPHATE 10 MG/ML IJ SOLN
INTRAMUSCULAR | Status: AC
  Filled 2014-08-23: qty 1

## 2014-08-23 MED ORDER — DIPHENHYDRAMINE HCL 25 MG PO CAPS
50.0000 mg | ORAL_CAPSULE | Freq: Once | ORAL | Status: AC
  Administered 2014-08-23: 50 mg via ORAL

## 2014-08-23 NOTE — Patient Instructions (Signed)
[facility] Discharge Instructions for Patients Receiving Chemotherapy  Today you received the following chemotherapy agents Elotuzumab.  To help prevent nausea and vomiting after your treatment, we encourage you to take your nausea medication as prescribed.   If you develop nausea and vomiting that is not controlled by your nausea medication, call the clinic.   BELOW ARE SYMPTOMS THAT SHOULD BE REPORTED IMMEDIATELY:  *FEVER GREATER THAN 100.5 F  *CHILLS WITH OR WITHOUT FEVER  NAUSEA AND VOMITING THAT IS NOT CONTROLLED WITH YOUR NAUSEA MEDICATION  *UNUSUAL SHORTNESS OF BREATH  *UNUSUAL BRUISING OR BLEEDING  TENDERNESS IN MOUTH AND THROAT WITH OR WITHOUT PRESENCE OF ULCERS  *URINARY PROBLEMS  *BOWEL PROBLEMS  UNUSUAL RASH Items with * indicate a potential emergency and should be followed up as soon as possible.  Feel free to call the clinic you have any questions or concerns. The clinic phone number is [phone].    

## 2014-08-23 NOTE — Progress Notes (Signed)
08/23/14 at 1:46pm - [facility] [id number]- cycle 1, day 22 study notes- The pt was into the cancer center this afternoon for her cycle 1, day 22 assessments and treatment. Dr. Alvy Bimler reviewed the pt's labs, and she felt the pt's labs were acceptable for treatment. Dr. Alvy Bimler also signed the pt's Revlimid orders for cycle 2 to begin next week. The pt reports ongoing fatigue. The pt denies any new medications. The pt confirmed that she took her decadron 28 mg this morning with food prior to coming to the cancer center. The pt states she took her last Revlimid dose for this cycle last night.  Dr. Alvy Bimler stated that the pt can take her Revlimid dose in the mornings starting with cycle 2.  The pt's kit assignment for elotuzumab was made through [facility] the [facility], and taken to the pharmacy. The research nurse spoke to [place], infusion room nurse, regarding the pt's study drug administration. The research nurse provided written instructions on the timing of the premedications and the rate of the study drug administration to the infusion nurse. The research nurse emphasized the timing of the premedications with regard to the start of elotuzumab and the requirement of administering through a 0.22 micron filter. Also, provided the nurse with guidance on grading a transfusion reaction and recommendations for managing an infusion reaction. Requested that they call the research department should the patient experience any grade of infusion reaction. RN agreed to all follow all instructions. Patient denied having any questions re: her calendar or treatment regimen. The pt was given her appointments for October and November.

## 2014-08-24 NOTE — Telephone Encounter (Signed)
RECEIVED A FAX FROM [facility] CONCERNING A CONFIRMATION OF FACSIMILE RECEIPT FOR PT. REFERRAL. 

## 2014-08-29 ENCOUNTER — Telehealth: Payer: Self-pay | Admitting: *Deleted

## 2014-08-29 NOTE — Telephone Encounter (Signed)
Received fax from [facility] shipped 08/28/14

## 2014-08-29 NOTE — Telephone Encounter (Signed)
08/30/14 at 9:40am - The research nurse called the pt to check on her status.  The pt reports that she is doing well.  The research nurse informed that pt that she should receive her Revlimid in the mail today.  A note in EPIC states that [facility] shipped Revlimid on 08/28/14.  The pt said that she will start it on 08/30/14.  The pt was also reminded that she needs to take her decadron dose (28 mg) with food prior to her office visit tomorrow.  The pt verbalized understanding.  The pt and research nurse discussed her cycle 3 schedule.  Dr. Alvy Bimler is not available on Wednesday, 11/25 or Friday, 11/27 (Thanksgiving on 09/27/14).  The pt said that she would prefer to begin her cycle 3 the week of 10/01/14.  The pt said that she would prefer to stay on her Thursday schedule.  The research nurse stated that the study allows Dr. Alvy Bimler to start this cycle at her discretion.  The pt was informed that Raoul Pitch, Rn will see the pt tomorrow.

## 2014-08-30 ENCOUNTER — Encounter: Payer: Self-pay | Admitting: Hematology and Oncology

## 2014-08-30 ENCOUNTER — Telehealth: Payer: Self-pay | Admitting: Hematology and Oncology

## 2014-08-30 ENCOUNTER — Encounter: Payer: Self-pay | Admitting: *Deleted

## 2014-08-30 ENCOUNTER — Ambulatory Visit (HOSPITAL_BASED_OUTPATIENT_CLINIC_OR_DEPARTMENT_OTHER): Payer: Medicare Other

## 2014-08-30 ENCOUNTER — Ambulatory Visit (HOSPITAL_BASED_OUTPATIENT_CLINIC_OR_DEPARTMENT_OTHER): Payer: Medicare Other | Admitting: Hematology and Oncology

## 2014-08-30 ENCOUNTER — Other Ambulatory Visit (HOSPITAL_BASED_OUTPATIENT_CLINIC_OR_DEPARTMENT_OTHER): Payer: Medicare Other

## 2014-08-30 ENCOUNTER — Telehealth: Payer: Self-pay | Admitting: *Deleted

## 2014-08-30 VITALS — BP 145/92 | HR 93 | Temp 97.6°F | Resp 18 | Ht 64.0 in | Wt 119.6 lb

## 2014-08-30 DIAGNOSIS — K2970 Gastritis, unspecified, without bleeding: Secondary | ICD-10-CM | POA: Insufficient documentation

## 2014-08-30 DIAGNOSIS — C9000 Multiple myeloma not having achieved remission: Secondary | ICD-10-CM

## 2014-08-30 DIAGNOSIS — Z720 Tobacco use: Secondary | ICD-10-CM

## 2014-08-30 DIAGNOSIS — F17200 Nicotine dependence, unspecified, uncomplicated: Secondary | ICD-10-CM

## 2014-08-30 DIAGNOSIS — Z5112 Encounter for antineoplastic immunotherapy: Secondary | ICD-10-CM

## 2014-08-30 DIAGNOSIS — I10 Essential (primary) hypertension: Secondary | ICD-10-CM

## 2014-08-30 LAB — CBC WITH DIFFERENTIAL/PLATELET
BASO%: 2.1 % — AB (ref 0.0–2.0)
Basophils Absolute: 0.1 10*3/uL (ref 0.0–0.1)
EOS%: 2.1 % (ref 0.0–7.0)
Eosinophils Absolute: 0.1 10*3/uL (ref 0.0–0.5)
HCT: 44.5 % (ref 34.8–46.6)
HGB: 15 g/dL (ref 11.6–15.9)
LYMPH%: 12.9 % — AB (ref 14.0–49.7)
MCH: 30.6 pg (ref 25.1–34.0)
MCHC: 33.7 g/dL (ref 31.5–36.0)
MCV: 90.8 fL (ref 79.5–101.0)
MONO#: 0.2 10*3/uL (ref 0.1–0.9)
MONO%: 4 % (ref 0.0–14.0)
NEUT#: 4.5 10*3/uL (ref 1.5–6.5)
NEUT%: 78.9 % — ABNORMAL HIGH (ref 38.4–76.8)
Platelets: 231 10*3/uL (ref 145–400)
RBC: 4.9 10*6/uL (ref 3.70–5.45)
RDW: 14.2 % (ref 11.2–14.5)
WBC: 5.7 10*3/uL (ref 3.9–10.3)
lymph#: 0.7 10*3/uL — ABNORMAL LOW (ref 0.9–3.3)

## 2014-08-30 LAB — COMPREHENSIVE METABOLIC PANEL (CC13)
ALT: 14 U/L (ref 0–55)
AST: 15 U/L (ref 5–34)
Albumin: 3.4 g/dL — ABNORMAL LOW (ref 3.5–5.0)
Alkaline Phosphatase: 120 U/L (ref 40–150)
Anion Gap: 8 mEq/L (ref 3–11)
BILIRUBIN TOTAL: 0.38 mg/dL (ref 0.20–1.20)
BUN: 11.5 mg/dL (ref 7.0–26.0)
CO2: 23 mEq/L (ref 22–29)
Calcium: 9.6 mg/dL (ref 8.4–10.4)
Chloride: 108 mEq/L (ref 98–109)
Creatinine: 0.8 mg/dL (ref 0.6–1.1)
Glucose: 142 mg/dl — ABNORMAL HIGH (ref 70–140)
Potassium: 3.8 mEq/L (ref 3.5–5.1)
Sodium: 139 mEq/L (ref 136–145)
TOTAL PROTEIN: 6.6 g/dL (ref 6.4–8.3)

## 2014-08-30 MED ORDER — DIPHENHYDRAMINE HCL 25 MG PO CAPS
ORAL_CAPSULE | ORAL | Status: AC
  Filled 2014-08-30: qty 2

## 2014-08-30 MED ORDER — ACETAMINOPHEN 325 MG PO TABS
ORAL_TABLET | ORAL | Status: AC
  Filled 2014-08-30: qty 2

## 2014-08-30 MED ORDER — SODIUM CHLORIDE 0.9 % IV SOLN
10.0000 mg/kg | Freq: Once | INTRAVENOUS | Status: AC
  Administered 2014-08-30: 540 mg via INTRAVENOUS
  Filled 2014-08-30: qty 21.6

## 2014-08-30 MED ORDER — SODIUM CHLORIDE 0.9 % IJ SOLN
10.0000 mL | INTRAMUSCULAR | Status: DC | PRN
  Administered 2014-08-30: 10 mL
  Filled 2014-08-30: qty 10

## 2014-08-30 MED ORDER — SODIUM CHLORIDE 0.9 % IV SOLN
Freq: Once | INTRAVENOUS | Status: AC
  Administered 2014-08-30: 15:00:00 via INTRAVENOUS

## 2014-08-30 MED ORDER — DEXAMETHASONE SODIUM PHOSPHATE 10 MG/ML IJ SOLN
8.0000 mg | Freq: Once | INTRAMUSCULAR | Status: AC
  Administered 2014-08-30: 8 mg via INTRAVENOUS

## 2014-08-30 MED ORDER — DIPHENHYDRAMINE HCL 25 MG PO CAPS
50.0000 mg | ORAL_CAPSULE | Freq: Once | ORAL | Status: AC
  Administered 2014-08-30: 50 mg via ORAL

## 2014-08-30 MED ORDER — DEXAMETHASONE SODIUM PHOSPHATE 10 MG/ML IJ SOLN
INTRAMUSCULAR | Status: AC
  Filled 2014-08-30: qty 1

## 2014-08-30 MED ORDER — FAMOTIDINE IN NACL 20-0.9 MG/50ML-% IV SOLN
20.0000 mg | Freq: Once | INTRAVENOUS | Status: AC
  Administered 2014-08-30: 20 mg via INTRAVENOUS

## 2014-08-30 MED ORDER — HEPARIN SOD (PORK) LOCK FLUSH 100 UNIT/ML IV SOLN
500.0000 [IU] | Freq: Once | INTRAVENOUS | Status: AC | PRN
  Administered 2014-08-30: 500 [IU]
  Filled 2014-08-30: qty 5

## 2014-08-30 MED ORDER — ACETAMINOPHEN 325 MG PO TABS
650.0000 mg | ORAL_TABLET | Freq: Once | ORAL | Status: AC
  Administered 2014-08-30: 650 mg via ORAL

## 2014-08-30 MED ORDER — FAMOTIDINE IN NACL 20-0.9 MG/50ML-% IV SOLN
INTRAVENOUS | Status: AC
  Filled 2014-08-30: qty 50

## 2014-08-30 NOTE — Patient Instructions (Signed)
[facility] Discharge Instructions for Patients Receiving Chemotherapy  Today you received the following chemotherapy agents Elotuzumab  To help prevent nausea and vomiting after your treatment, we encourage you to take your nausea medication   If you develop nausea and vomiting that is not controlled by your nausea medication, call the clinic.   BELOW ARE SYMPTOMS THAT SHOULD BE REPORTED IMMEDIATELY:  *FEVER GREATER THAN 100.5 F  *CHILLS WITH OR WITHOUT FEVER  NAUSEA AND VOMITING THAT IS NOT CONTROLLED WITH YOUR NAUSEA MEDICATION  *UNUSUAL SHORTNESS OF BREATH  *UNUSUAL BRUISING OR BLEEDING  TENDERNESS IN MOUTH AND THROAT WITH OR WITHOUT PRESENCE OF ULCERS  *URINARY PROBLEMS  *BOWEL PROBLEMS  UNUSUAL RASH Items with * indicate a potential emergency and should be followed up as soon as possible.  Feel free to call the clinic you have any questions or concerns. The clinic phone number is [phone].                      

## 2014-08-30 NOTE — Telephone Encounter (Signed)
Per staff message and POF I have scheduled appts. Advised scheduler of appts. JMW  

## 2014-08-30 NOTE — Assessment & Plan Note (Signed)
I suspect this could be due to her steroids but the patient thought this is related to Tylenol. If her symptoms recur again, I will consider adjusting the dose of dexamethasone or to start her on proton pump inhibitor.

## 2014-08-30 NOTE — Progress Notes (Signed)
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   01/03/2007 -  Radiation Therapy Approximate date only, received RT for plasmacytoma followed by surgery   09/14/2007 Initial Diagnosis MULTIPLE  MYELOMA   10/05/2007 Bone Marrow Biopsy Bm biopsy was negative   06/18/2008 Bone Marrow Biopsy BM biopsy was negative   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 [facility]   04/13/2011 Relapse/Recurrence Disease relapse   04/14/2011 Bone Marrow Biopsy Bm biopsy showed 2 % plasma cell   04/27/2011 Relapse/Recurrence Disease relapse, treated with Velcade/Cytoxan/Dex   09/07/2011 - 07/28/2013 Chemotherapy She has been receiving Velcade   12/27/2011 Bone Marrow Transplant 2nd transplant at [facility]   07/28/2013 Relapse/Recurrence Chemo is stopped due to progression of disease   08/29/2013 Imaging PEt/CT showed recurrence of disease with new lesion on her rib with compression fracture   09/13/2013 Bone Marrow Biopsy BM biopsy is hypercellular with 6% plasma cell   10/10/2013 - 10/20/2013 Radiation Therapy Started on palliative XRT for rib pain   11/27/2013 - 04/06/2014 Chemotherapy The patient starts chemotherapy with Carfilzomib   07/19/2014 Imaging Repeat bloodwork and PET/CT scan shows significant disease progression.   08/02/2014 -  Chemotherapy She enrolled in clinical trial using combination therapy with Revlimid, dexamethasone and  Elotuzumab    INTERVAL HISTORY: Please see below for problem oriented charting. She tolerated treatment well from recent stomach upset lasting for a day and a half. She is seen prior to cycle 2 of treatment. She denies new recent nausea or vomiting.  REVIEW OF SYSTEMS:   Constitutional: Denies fevers, chills or abnormal weight loss Eyes: Denies blurriness of vision Ears, nose, mouth, throat, and face: Denies mucositis or sore throat Respiratory: Denies cough, dyspnea or wheezes Cardiovascular: Denies palpitation, chest discomfort or lower extremity swelling Skin: Denies abnormal skin rashes Lymphatics: Denies new lymphadenopathy or easy bruising Neurological:Denies numbness, tingling or new weaknesses Behavioral/Psych: Mood is stable, no new changes  All other systems were reviewed with the patient and are negative.  I have reviewed the past medical history, past surgical history, social history and family history with the patient and they are unchanged from previous note.  ALLERGIES:  is allergic to codeine and ibuprofen.  MEDICATIONS:  Current Outpatient Prescriptions  Medication Sig Dispense Refill  . albuterol (PROVENTIL HFA;VENTOLIN HFA) 108 (90 BASE) MCG/ACT inhaler Inhale 2 puffs into the lungs every 4 (four) hours as needed for wheezing or shortness of breath.  1 Inhaler  0  . Alum & Mag Hydroxide-Simeth (MAGIC MOUTHWASH) SOLN Take 10 mLs by mouth 4 (four) times daily as needed for mouth pain. Swish and spit  Or  Swish and swallow.  300 mL  2  . aspirin 81 MG tablet Take 81 mg by mouth daily.      . Cholecalciferol (VITAMIN D-3) 5000 UNITS TABS Take 1 tablet by mouth every morning.      Marland Kitchen dexamethasone (DECADRON) 4 MG tablet Take as directed by chemotherapy protocol,  7 pills on the day of chemo  90 tablet  3  . lenalidomide (REVLIMID) 25 MG capsule TAKE ONE CAPSULE BY MOUTH EVERY DAY FOR 21 DAYS THEN 7 DAYS OFF.  21 capsule  0  . lidocaine-prilocaine (EMLA) cream Apply 1  application topically as needed.  30 g  0  . lidocaine-prilocaine (EMLA) cream Apply 1 application topically as needed.  30 g  6  . lisinopril (PRINIVIL,ZESTRIL) 20 MG tablet Take 40 mg by mouth daily.       . magnesium hydroxide (MILK OF MAGNESIA) 400 MG/5ML suspension Take 30 mLs by mouth daily as needed for mild constipation.      Marland Kitchen morphine (MS CONTIN) 60 MG 12 hr tablet Take 60 mg by mouth every 12 (twelve) hours.      Marland Kitchen morphine (MSIR) 30 MG tablet Take 30 mg by mouth every 4 (four) hours as needed for severe pain.      Marland Kitchen prochlorperazine (COMPAZINE) 10 MG tablet Take 1 tablet (10 mg total) by mouth every 6 (six) hours as needed for nausea.  60 tablet  3  . [DISCONTINUED] amLODipine (NORVASC) 5 MG tablet Take 1 tablet (5 mg total) by mouth daily.  90 tablet  3  . [DISCONTINUED] gabapentin (NEURONTIN) 600 MG tablet Take 600 mg by mouth 3 (three) times daily.         No current facility-administered medications for this visit.   Facility-Administered Medications Ordered in Other Visits  Medication Dose Route Frequency Provider Last Rate Last Dose  . sodium chloride 0.9 % injection 10 mL  10 mL Intravenous PRN Heath Lark, MD   10 mL at 02/26/14 1425  . sodium chloride 0.9 % injection 10 mL  10 mL Intravenous PRN Chauncey Cruel, MD   10 mL at 04/05/14 1333    PHYSICAL EXAMINATION: ECOG PERFORMANCE STATUS: 0 - Asymptomatic  Filed Vitals:   08/30/14 1224  BP: 145/92  Pulse: 93  Temp: 97.6 F (36.4 C)  Resp: 18   Filed Weights   08/30/14 1224  Weight: 119 lb 9.6 oz (54.25 kg)    GENERAL:alert, no distress and comfortable SKIN: skin color, texture, turgor are normal, no rashes or significant lesions EYES: normal, Conjunctiva are pink and non-injected, sclera clear OROPHARYNX:no exudate, no erythema and lips, buccal mucosa, and tongue normal  NECK: supple, thyroid normal size, non-tender, without nodularity LYMPH:  no palpable lymphadenopathy in the cervical, axillary or  inguinal LUNGS: clear to auscultation and percussion with normal breathing effort HEART: regular rate & rhythm and no murmurs and no lower extremity edema ABDOMEN:abdomen soft, non-tender and normal bowel sounds Musculoskeletal:no cyanosis of digits and no clubbing  NEURO: alert & oriented x 3 with fluent speech, no focal motor/sensory deficits  LABORATORY DATA:  I have reviewed the data as listed    Component Value Date/Time   NA 139 08/30/2014 1212   NA 138 10/06/2013 1920   K 3.8 08/30/2014 1212   K 4.3 10/06/2013 1920   CL 103 10/06/2013 1920   CL 107 03/10/2013 1014   CO2 23 08/30/2014 1212   CO2 25 10/06/2013 1920   GLUCOSE 142* 08/30/2014 1212   GLUCOSE 99 10/06/2013 1920   GLUCOSE 111* 03/10/2013 1014   GLUCOSE 98 08/31/2006 1024   BUN 11.5 08/30/2014 1212   BUN 10 10/06/2013 1920   CREATININE 0.8 08/30/2014 1212   CREATININE 0.63 10/06/2013 1920   CALCIUM 9.6 08/30/2014 1212   CALCIUM 9.8 10/06/2013 1920   PROT  6.6 08/30/2014 1212   PROT 7.2 10/06/2013 1920   ALBUMIN 3.4* 08/30/2014 1212   ALBUMIN 3.7 10/06/2013 1920   AST 15 08/30/2014 1212   AST 16 10/06/2013 1920   ALT 14 08/30/2014 1212   ALT 11 10/06/2013 1920   ALKPHOS 120 08/30/2014 1212   ALKPHOS 100 10/06/2013 1920   BILITOT 0.38 08/30/2014 1212   BILITOT 0.3 10/06/2013 1920   GFRNONAA >90 10/06/2013 1920   GFRAA >90 10/06/2013 1920    No results found for this basename: SPEP, UPEP,  kappa and lambda light chains    Lab Results  Component Value Date   WBC 5.7 08/30/2014   NEUTROABS 4.5 08/30/2014   HGB 15.0 08/30/2014   HCT 44.5 08/30/2014   MCV 90.8 08/30/2014   PLT 231 08/30/2014      Chemistry      Component Value Date/Time   NA 139 08/30/2014 1212   NA 138 10/06/2013 1920   K 3.8 08/30/2014 1212   K 4.3 10/06/2013 1920   CL 103 10/06/2013 1920   CL 107 03/10/2013 1014   CO2 23 08/30/2014 1212   CO2 25 10/06/2013 1920   BUN 11.5 08/30/2014 1212   BUN 10 10/06/2013 1920   CREATININE 0.8 08/30/2014 1212    CREATININE 0.63 10/06/2013 1920      Component Value Date/Time   CALCIUM 9.6 08/30/2014 1212   CALCIUM 9.8 10/06/2013 1920   ALKPHOS 120 08/30/2014 1212   ALKPHOS 100 10/06/2013 1920   AST 15 08/30/2014 1212   AST 16 10/06/2013 1920   ALT 14 08/30/2014 1212   ALT 11 10/06/2013 1920   BILITOT 0.38 08/30/2014 1212   BILITOT 0.3 10/06/2013 1920      ASSESSMENT & PLAN:  Multiple myeloma Clinically, she is tolerating treatment very well. I will recheck monoclonal protein studies again in 2 weeks. I will see her back before cycle 3 of therapy. In the meantime, she will proceed with treatment without dose adjustment.  Essential hypertension The blood pressure is slightly elevated, could be due to anxiety. Recommend she continue lisinopril. If she it is elevated again next time, she might need dose adjustment.    Smoker I spent some time counseling the patient the importance of tobacco cessation. She is currently not interested to quit now.       Gastritis I suspect this could be due to her steroids but the patient thought this is related to Tylenol. If her symptoms recur again, I will consider adjusting the dose of dexamethasone or to start her on proton pump inhibitor.   Orders Placed This Encounter  Procedures  . SPEP & IFE with QIG    Standing Status: Future     Number of Occurrences:      Standing Expiration Date: 10/04/2015  . Beta 2 microglobulin, serum    Standing Status: Future     Number of Occurrences:      Standing Expiration Date: 10/04/2015  . Kappa/lambda light chains    Standing Status: Future     Number of Occurrences:      Standing Expiration Date: 10/04/2015   All questions were answered. The patient knows to call the clinic with any problems, questions or concerns. No barriers to learning was detected. I spent 25 minutes counseling the patient face to face. The total time spent in the appointment was 30 minutes and more than 50% was on counseling and  review of test results     [facility], [place], MD 08/30/2014  12:55 PM

## 2014-08-30 NOTE — Telephone Encounter (Signed)
Gave avs & cal for Nov. Sent mess to sch Tx.

## 2014-08-30 NOTE — Assessment & Plan Note (Addendum)
The blood pressure is slightly elevated, could be due to anxiety. Recommend she continue lisinopril. If she it is elevated again next time, she might need dose adjustment.

## 2014-08-30 NOTE — Progress Notes (Signed)
08/30/14 _0 :00 pm, BMS [id number], Cycle 2, Day 1:  [name] into the [facility] alone for the start of cycle 2.  She reported having taken dexamethasone 28 mg at 9:00 am with food.  She also took the first lenalidomide 10 mg this morning.  She is doing very well with only a complaint of stomach upset 4 days after her last treatment.  She has gained a little weight and is thrilled about that.  She and Dr. Alvy Bimler decided that she will start Cycle 3 on 09/26/14 due to the Thanksgiving holiday. She will have disease assessment labs drawn on 09/13/14 so that they may discuss the results on 09/26/14. Spoke with Montel Clock, PharmD who adjusted the dose of elotuzumab to today's weight.  Obtained kit assignment through the [facility] system and gave that to the pharmacy staff.  Spoke with Kathe Becton, RN in the infusion about about Kendra Johns's treatment and provided her with a sign for the pump. Emphasized the rate of infusion, the timing of the premeds, and the use of the 0.22 micron filter.

## 2014-08-30 NOTE — Assessment & Plan Note (Signed)
I spent some time counseling the patient the importance of tobacco cessation. She is currently not interested to quit now. 

## 2014-08-30 NOTE — Assessment & Plan Note (Signed)
Clinically, she is tolerating treatment very well. I will recheck monoclonal protein studies again in 2 weeks. I will see her back before cycle 3 of therapy. In the meantime, she will proceed with treatment without dose adjustment.

## 2014-09-06 ENCOUNTER — Other Ambulatory Visit (HOSPITAL_BASED_OUTPATIENT_CLINIC_OR_DEPARTMENT_OTHER): Payer: 59

## 2014-09-06 ENCOUNTER — Encounter: Payer: Self-pay | Admitting: *Deleted

## 2014-09-06 ENCOUNTER — Ambulatory Visit (HOSPITAL_BASED_OUTPATIENT_CLINIC_OR_DEPARTMENT_OTHER): Payer: 59

## 2014-09-06 DIAGNOSIS — C9000 Multiple myeloma not having achieved remission: Secondary | ICD-10-CM

## 2014-09-06 DIAGNOSIS — Z006 Encounter for examination for normal comparison and control in clinical research program: Secondary | ICD-10-CM

## 2014-09-06 DIAGNOSIS — Z5112 Encounter for antineoplastic immunotherapy: Secondary | ICD-10-CM

## 2014-09-06 DIAGNOSIS — I10 Essential (primary) hypertension: Secondary | ICD-10-CM

## 2014-09-06 LAB — CBC WITH DIFFERENTIAL/PLATELET
BASO%: 0.6 % (ref 0.0–2.0)
BASOS ABS: 0.1 10*3/uL (ref 0.0–0.1)
EOS%: 3.9 % (ref 0.0–7.0)
Eosinophils Absolute: 0.4 10*3/uL (ref 0.0–0.5)
HCT: 44.1 % (ref 34.8–46.6)
HEMOGLOBIN: 14.9 g/dL (ref 11.6–15.9)
LYMPH%: 7 % — AB (ref 14.0–49.7)
MCH: 30.8 pg (ref 25.1–34.0)
MCHC: 33.8 g/dL (ref 31.5–36.0)
MCV: 91.1 fL (ref 79.5–101.0)
MONO#: 0.2 10*3/uL (ref 0.1–0.9)
MONO%: 2.1 % (ref 0.0–14.0)
NEUT#: 7.8 10*3/uL — ABNORMAL HIGH (ref 1.5–6.5)
NEUT%: 86.4 % — ABNORMAL HIGH (ref 38.4–76.8)
PLATELETS: 158 10*3/uL (ref 145–400)
RBC: 4.84 10*6/uL (ref 3.70–5.45)
RDW: 15 % — ABNORMAL HIGH (ref 11.2–14.5)
WBC: 9 10*3/uL (ref 3.9–10.3)
lymph#: 0.6 10*3/uL — ABNORMAL LOW (ref 0.9–3.3)

## 2014-09-06 LAB — COMPREHENSIVE METABOLIC PANEL (CC13)
ALK PHOS: 129 U/L (ref 40–150)
ALT: 23 U/L (ref 0–55)
ANION GAP: 8 meq/L (ref 3–11)
AST: 21 U/L (ref 5–34)
Albumin: 3.7 g/dL (ref 3.5–5.0)
BILIRUBIN TOTAL: 0.74 mg/dL (ref 0.20–1.20)
BUN: 8.3 mg/dL (ref 7.0–26.0)
CALCIUM: 9.3 mg/dL (ref 8.4–10.4)
CO2: 23 mEq/L (ref 22–29)
CREATININE: 0.8 mg/dL (ref 0.6–1.1)
Chloride: 106 mEq/L (ref 98–109)
GLUCOSE: 144 mg/dL — AB (ref 70–140)
POTASSIUM: 3.7 meq/L (ref 3.5–5.1)
Sodium: 137 mEq/L (ref 136–145)
Total Protein: 6.6 g/dL (ref 6.4–8.3)

## 2014-09-06 MED ORDER — ACETAMINOPHEN 325 MG PO TABS
650.0000 mg | ORAL_TABLET | Freq: Once | ORAL | Status: AC
  Administered 2014-09-06: 650 mg via ORAL

## 2014-09-06 MED ORDER — DIPHENHYDRAMINE HCL 25 MG PO CAPS
ORAL_CAPSULE | ORAL | Status: AC
  Filled 2014-09-06: qty 2

## 2014-09-06 MED ORDER — HEPARIN SOD (PORK) LOCK FLUSH 100 UNIT/ML IV SOLN
500.0000 [IU] | Freq: Once | INTRAVENOUS | Status: AC | PRN
  Administered 2014-09-06: 500 [IU]
  Filled 2014-09-06: qty 5

## 2014-09-06 MED ORDER — DIPHENHYDRAMINE HCL 25 MG PO CAPS
50.0000 mg | ORAL_CAPSULE | Freq: Once | ORAL | Status: AC
  Administered 2014-09-06: 50 mg via ORAL

## 2014-09-06 MED ORDER — FAMOTIDINE IN NACL 20-0.9 MG/50ML-% IV SOLN
INTRAVENOUS | Status: AC
  Filled 2014-09-06: qty 50

## 2014-09-06 MED ORDER — FAMOTIDINE IN NACL 20-0.9 MG/50ML-% IV SOLN
20.0000 mg | Freq: Once | INTRAVENOUS | Status: AC
  Administered 2014-09-06: 20 mg via INTRAVENOUS

## 2014-09-06 MED ORDER — ACETAMINOPHEN 325 MG PO TABS
ORAL_TABLET | ORAL | Status: AC
Start: 2014-09-06 — End: 2014-09-06
  Filled 2014-09-06: qty 2

## 2014-09-06 MED ORDER — SODIUM CHLORIDE 0.9 % IJ SOLN
10.0000 mL | INTRAMUSCULAR | Status: DC | PRN
Start: 2014-09-06 — End: 2014-09-06
  Administered 2014-09-06: 10 mL
  Filled 2014-09-06: qty 10

## 2014-09-06 MED ORDER — DEXAMETHASONE SODIUM PHOSPHATE 10 MG/ML IJ SOLN
8.0000 mg | Freq: Once | INTRAMUSCULAR | Status: AC
  Administered 2014-09-06: 8 mg via INTRAVENOUS

## 2014-09-06 MED ORDER — INV-ELOTUZUMAB CHEMO INJECTION 400MG BMS CA204-143
10.0000 mg/kg | Freq: Once | INTRAMUSCULAR | Status: AC
  Administered 2014-09-06: 540 mg via INTRAVENOUS
  Filled 2014-09-06: qty 21.6

## 2014-09-06 MED ORDER — SODIUM CHLORIDE 0.9 % IV SOLN
Freq: Once | INTRAVENOUS | Status: AC
  Administered 2014-09-06: 12:00:00 via INTRAVENOUS

## 2014-09-06 MED ORDER — DEXAMETHASONE SODIUM PHOSPHATE 10 MG/ML IJ SOLN
INTRAMUSCULAR | Status: AC
  Filled 2014-09-06: qty 1

## 2014-09-06 NOTE — Progress Notes (Signed)
09/06/14 at 2:19pm  BMS 143 - cycle 2, day 8 study notes- The pt was into the cancer center this morning for her cycle 2, day 8 assessments and treatment.  The pt's labs were acceptable for treatment today. The pt denies any new adverse events, and she reports she is tolerating her treatment well.  The pt denies any new medications. The pt confirmed that she took her decadron 28 mg this morning with food prior to coming to the cancer center. The pt states she is continuing her  Revlimid daily. The pt's kit assignment for elotuzumab was made through [facility], the [facility], and taken to the pharmacy. The research nurse spoke to [place], infusion room nurse, regarding the pt's study drug administration. The research nurse provided written instructions on the timing of the premedications and the rate of the study drug administration to the infusion nurse. The research nurse emphasized the timing of the premedications with regard to the start of elotuzumab and the requirement of administering through a 0.22 micron filter. Also, provided the nurse with guidance on grading a transfusion reaction and recommendations for managing an infusion reaction. Requested that they call the research department should the patient experience any grade of infusion reaction. RN agreed to all follow all instructions. Patient denied having any questions re: her calendar or treatment regimen. The pt is aware of her November appts.  The pt had no questions or concerns about her treatment plan.

## 2014-09-06 NOTE — Patient Instructions (Signed)
[facility] Discharge Instructions for Patients Receiving Chemotherapy  Today you received the following chemotherapy agents: Nivolumab.  To help prevent nausea and vomiting after your treatment, we encourage you to take your nausea medication: as directed.   If you develop nausea and vomiting that is not controlled by your nausea medication, call the clinic.   BELOW ARE SYMPTOMS THAT SHOULD BE REPORTED IMMEDIATELY:  *FEVER GREATER THAN 100.5 F  *CHILLS WITH OR WITHOUT FEVER  NAUSEA AND VOMITING THAT IS NOT CONTROLLED WITH YOUR NAUSEA MEDICATION  *UNUSUAL SHORTNESS OF BREATH  *UNUSUAL BRUISING OR BLEEDING  TENDERNESS IN MOUTH AND THROAT WITH OR WITHOUT PRESENCE OF ULCERS  *URINARY PROBLEMS  *BOWEL PROBLEMS  UNUSUAL RASH Items with * indicate a potential emergency and should be followed up as soon as possible.  Feel free to call the clinic you have any questions or concerns. The clinic phone number is [phone].    

## 2014-09-11 ENCOUNTER — Emergency Department (HOSPITAL_COMMUNITY)
Admission: EM | Admit: 2014-09-11 | Discharge: 2014-09-11 | Disposition: A | Discharge status: Home / Self Care | Payer: Medicare Other | Attending: Emergency Medicine | Admitting: Emergency Medicine

## 2014-09-11 ENCOUNTER — Encounter (HOSPITAL_COMMUNITY): Payer: Self-pay | Admitting: Emergency Medicine

## 2014-09-11 ENCOUNTER — Emergency Department (HOSPITAL_COMMUNITY): Payer: Medicare Other

## 2014-09-11 DIAGNOSIS — Z8579 Personal history of other malignant neoplasms of lymphoid, hematopoietic and related tissues: Secondary | ICD-10-CM | POA: Diagnosis not present

## 2014-09-11 DIAGNOSIS — J441 Chronic obstructive pulmonary disease with (acute) exacerbation: Secondary | ICD-10-CM | POA: Diagnosis not present

## 2014-09-11 DIAGNOSIS — Z720 Tobacco use: Secondary | ICD-10-CM | POA: Diagnosis not present

## 2014-09-11 DIAGNOSIS — Z8619 Personal history of other infectious and parasitic diseases: Secondary | ICD-10-CM | POA: Insufficient documentation

## 2014-09-11 DIAGNOSIS — M1990 Unspecified osteoarthritis, unspecified site: Secondary | ICD-10-CM | POA: Diagnosis not present

## 2014-09-11 DIAGNOSIS — I10 Essential (primary) hypertension: Secondary | ICD-10-CM | POA: Diagnosis not present

## 2014-09-11 DIAGNOSIS — E559 Vitamin D deficiency, unspecified: Secondary | ICD-10-CM | POA: Insufficient documentation

## 2014-09-11 DIAGNOSIS — F419 Anxiety disorder, unspecified: Secondary | ICD-10-CM | POA: Insufficient documentation

## 2014-09-11 DIAGNOSIS — Z79899 Other long term (current) drug therapy: Secondary | ICD-10-CM | POA: Insufficient documentation

## 2014-09-11 DIAGNOSIS — F329 Major depressive disorder, single episode, unspecified: Secondary | ICD-10-CM | POA: Insufficient documentation

## 2014-09-11 DIAGNOSIS — G8929 Other chronic pain: Secondary | ICD-10-CM | POA: Diagnosis not present

## 2014-09-11 DIAGNOSIS — Z86010 Personal history of colonic polyps: Secondary | ICD-10-CM | POA: Insufficient documentation

## 2014-09-11 DIAGNOSIS — Z7982 Long term (current) use of aspirin: Secondary | ICD-10-CM | POA: Diagnosis not present

## 2014-09-11 DIAGNOSIS — Z7952 Long term (current) use of systemic steroids: Secondary | ICD-10-CM | POA: Insufficient documentation

## 2014-09-11 DIAGNOSIS — Z8719 Personal history of other diseases of the digestive system: Secondary | ICD-10-CM | POA: Diagnosis not present

## 2014-09-11 DIAGNOSIS — Z8739 Personal history of other diseases of the musculoskeletal system and connective tissue: Secondary | ICD-10-CM | POA: Diagnosis not present

## 2014-09-11 DIAGNOSIS — R0602 Shortness of breath: Secondary | ICD-10-CM

## 2014-09-11 LAB — CBC WITH DIFFERENTIAL/PLATELET
BASOS PCT: 0 % (ref 0–1)
Basophils Absolute: 0 10*3/uL (ref 0.0–0.1)
Eosinophils Absolute: 0 10*3/uL (ref 0.0–0.7)
Eosinophils Relative: 0 % (ref 0–5)
HCT: 42.8 % (ref 36.0–46.0)
Hemoglobin: 14.6 g/dL (ref 12.0–15.0)
Lymphocytes Relative: 8 % — ABNORMAL LOW (ref 12–46)
Lymphs Abs: 0.8 10*3/uL (ref 0.7–4.0)
MCH: 30.7 pg (ref 26.0–34.0)
MCHC: 34.1 g/dL (ref 30.0–36.0)
MCV: 90.1 fL (ref 78.0–100.0)
MONOS PCT: 6 % (ref 3–12)
Monocytes Absolute: 0.7 10*3/uL (ref 0.1–1.0)
NEUTROS PCT: 86 % — AB (ref 43–77)
Neutro Abs: 9 10*3/uL — ABNORMAL HIGH (ref 1.7–7.7)
PLATELETS: 170 10*3/uL (ref 150–400)
RBC: 4.75 MIL/uL (ref 3.87–5.11)
RDW: 14.5 % (ref 11.5–15.5)
WBC: 10.6 10*3/uL — ABNORMAL HIGH (ref 4.0–10.5)

## 2014-09-11 LAB — COMPREHENSIVE METABOLIC PANEL
ALK PHOS: 113 U/L (ref 39–117)
ALT: 18 U/L (ref 0–35)
AST: 18 U/L (ref 0–37)
Albumin: 3.4 g/dL — ABNORMAL LOW (ref 3.5–5.2)
Anion gap: 13 (ref 5–15)
BUN: 10 mg/dL (ref 6–23)
CO2: 21 mEq/L (ref 19–32)
Calcium: 8.9 mg/dL (ref 8.4–10.5)
Chloride: 104 mEq/L (ref 96–112)
Creatinine, Ser: 0.56 mg/dL (ref 0.50–1.10)
GFR calc Af Amer: >90 mL/min (ref >90)
GFR calc non Af Amer: >90 mL/min (ref >90)
Glucose, Bld: 139 mg/dL — ABNORMAL HIGH (ref 70–99)
POTASSIUM: 4.5 meq/L (ref 3.7–5.3)
SODIUM: 138 meq/L (ref 137–147)
Total Bilirubin: 0.6 mg/dL (ref 0.3–1.2)
Total Protein: 6.6 g/dL (ref 6.0–8.3)

## 2014-09-11 LAB — TROPONIN I

## 2014-09-11 MED ORDER — METHYLPREDNISOLONE SODIUM SUCC 125 MG IJ SOLR
125.0000 mg | Freq: Once | INTRAMUSCULAR | Status: AC
  Administered 2014-09-11: 125 mg via INTRAVENOUS
  Filled 2014-09-11: qty 2

## 2014-09-11 MED ORDER — HEPARIN SOD (PORK) LOCK FLUSH 100 UNIT/ML IV SOLN
500.0000 [IU] | Freq: Once | INTRAVENOUS | Status: AC
  Administered 2014-09-11: 500 [IU]
  Filled 2014-09-11: qty 5

## 2014-09-11 MED ORDER — ALBUTEROL SULFATE HFA 108 (90 BASE) MCG/ACT IN AERS
1.0000 | INHALATION_SPRAY | RESPIRATORY_TRACT | Status: DC | PRN
Start: 2014-09-11 — End: 2014-09-11
  Administered 2014-09-11: 2 via RESPIRATORY_TRACT
  Filled 2014-09-11: qty 6.7

## 2014-09-11 MED ORDER — PREDNISONE 20 MG PO TABS: ORAL_TABLET | ORAL | Status: DC

## 2014-09-11 MED ORDER — ALBUTEROL (5 MG/ML) CONTINUOUS INHALATION SOLN
10.0000 mg/h | INHALATION_SOLUTION | RESPIRATORY_TRACT | Status: DC
  Administered 2014-09-11: 10 mg/h via RESPIRATORY_TRACT
  Filled 2014-09-11: qty 20

## 2014-09-11 MED ORDER — ALBUTEROL SULFATE (2.5 MG/3ML) 0.083% IN NEBU
5.0000 mg | INHALATION_SOLUTION | Freq: Once | RESPIRATORY_TRACT | Status: AC
  Administered 2014-09-11: 5 mg via RESPIRATORY_TRACT
  Filled 2014-09-11: qty 6

## 2014-09-11 NOTE — ED Provider Notes (Signed)
CSN: [id number]     Arrival date & time 09/11/14  0306 History   First MD Initiated Contact with Patient 09/11/14 0309     Chief Complaint  Patient presents with  . Shortness of Breath     (Consider location/radiation/quality/duration/timing/severity/associated sxs/prior Treatment) HPI Patient presents with several hours of increased shortness of breath. She states she developed a cough productive of white sputum earlier in the day. She also is having sharp left-sided chest pain worse with inspiration. She denies any lower extremity swelling or pain. She denies any fever or chills. States her husband had a similar "germ". Past Medical History  Diagnosis Date  . Depression   . Hypertension   . Hepatitis C     genotype 1  . Diverticulosis of colon   . Multiple myeloma     kappa light chain, s/p high-dose chemo/stem cell tx - [facility]  . Hx of adenomatous colonic polyps 2007  . Anxiety   . Osteoarthritis   . Sleep apnea   . Hyperlipidemia   . GERD with stricture   . External hemorrhoids   . Hepatitis B virus infection   . IBS (irritable bowel syndrome)   . Helicobacter pylori gastritis 2001    ? if treated  . Sciatic nerve pain     with Dr. Earle Gell  . Chronic low back pain   . Unspecified vitamin D deficiency 09/22/2013  . Allergy   . Fever 11/28/2013  . Dehydration 11/28/2013  . Nausea alone 01/22/2014  . Cough 08/02/2014   Past Surgical History  Procedure Laterality Date  . Abdominal hysterectomy    . Oophorectomy    . Dilation and curettage of uterus    . Bone marrow transplant      2009, 2013 [facility]  . Appendectomy    . Colonoscopy w/ polypectomy  08/2006    1-2 adenomas, 6 polyps total, diverticulosis and external hemorrhoids  . Upper gastrointestinal endoscopy  08/2003    esophageal stricture dilation, hiatal hernia, gastrritis  . Cholecystectomy    . Tonsillectomy and adenoidectomy     Family History  Problem Relation Age of Onset  . Alcohol abuse    . Lung  cancer Father     smoked  . Leukemia Mother   . Cirrhosis Sister   . Colon cancer Neg Hx   . Lung cancer Sister     smoked   History  Substance Use Topics  . Smoking status: Current Every Day Smoker -- 0.25 packs/day for 44 years    Types: Cigarettes  . Smokeless tobacco: Never Used  . Alcohol Use: No   OB History    No data available     Review of Systems  Constitutional: Negative for fever and chills.  Respiratory: Positive for cough and shortness of breath.   Cardiovascular: Positive for chest pain. Negative for palpitations and leg swelling.  Gastrointestinal: Negative for nausea, vomiting, abdominal pain and diarrhea.  Musculoskeletal: Negative for myalgias, back pain, neck pain and neck stiffness.  Skin: Negative for rash and wound.  Neurological: Negative for dizziness, weakness, light-headedness, numbness and headaches.  All other systems reviewed and are negative.     Allergies  Codeine and Ibuprofen  Home Medications   Prior to Admission medications   Medication Sig Start Date End Date Taking? Authorizing Provider  albuterol (PROVENTIL HFA;VENTOLIN HFA) 108 (90 BASE) MCG/ACT inhaler Inhale 2 puffs into the lungs every 4 (four) hours as needed for wheezing or shortness of breath. 10/06/13  Yes  Mirna Mires, MD  Alum & Mag Hydroxide-Simeth (MAGIC MOUTHWASH) SOLN Take 10 mLs by mouth 4 (four) times daily as needed for mouth pain. Swish and spit  Or  Swish and swallow. 03/29/14  Yes Heath Lark, MD  aspirin 81 MG tablet Take 81 mg by mouth daily.   Yes Historical Provider, MD  Cholecalciferol (VITAMIN D-3) 5000 UNITS TABS Take 1 tablet by mouth every morning.   Yes Historical Provider, MD  dexamethasone (DECADRON) 4 MG tablet Take as directed by chemotherapy protocol, 7 pills on the day of chemo 07/30/14  Yes Ni Gorsuch, MD  lenalidomide (REVLIMID) 25 MG capsule TAKE ONE CAPSULE BY MOUTH EVERY DAY FOR 21 DAYS THEN 7 DAYS OFF. Patient taking differently: Take 25 mg by  mouth daily. Take 1 capsule by mouth every day for 21 days then 7 days off. 08/23/14  Yes Heath Lark, MD  lidocaine-prilocaine (EMLA) cream Apply 1 application topically as needed. 11/06/13  Yes Heath Lark, MD  lisinopril (PRINIVIL,ZESTRIL) 20 MG tablet Take 40 mg by mouth daily.    Yes Historical Provider, MD  magnesium hydroxide (MILK OF MAGNESIA) 400 MG/5ML suspension Take 30 mLs by mouth daily as needed for mild constipation.   Yes Historical Provider, MD  morphine (MS CONTIN) 60 MG 12 hr tablet Take 60 mg by mouth every 12 (twelve) hours.   Yes Historical Provider, MD  morphine (MSIR) 30 MG tablet Take 30 mg by mouth every 4 (four) hours as needed for severe pain.   Yes Historical Provider, MD  prochlorperazine (COMPAZINE) 10 MG tablet Take 1 tablet (10 mg total) by mouth every 6 (six) hours as needed for nausea. 01/22/14  Yes Heath Lark, MD  lidocaine-prilocaine (EMLA) cream Apply 1 application topically as needed. 08/16/14   Heath Lark, MD  predniSONE (DELTASONE) 20 MG tablet 3 tabs po day one, then 2 po daily x 4 days 09/11/14   Julianne Rice, MD   BP 130/71 mmHg  Pulse 106  Temp(Src) 97.7 F (36.5 C) (Oral)  Resp 21  SpO2 98% Physical Exam  Constitutional: She is oriented to person, place, and time. She appears well-developed and well-nourished. No distress.  HENT:  Head: Normocephalic and atraumatic.  Mouth/Throat: Oropharynx is clear and moist. No oropharyngeal exudate.  Eyes: EOM are normal. Pupils are equal, round, and reactive to light.  Neck: Normal range of motion. Neck supple.  Cardiovascular: Normal rate and regular rhythm.   Pulmonary/Chest: No respiratory distress. She has wheezes (expiratory wheezing throughout). She has no rales. She exhibits tenderness (chest tenderness is reproduced with palpation over the anterior left lower chest. There is no crepitance or deformity.).  Increased work of breathing. Speaking in full sentences.  Abdominal: Soft. Bowel sounds are  normal. She exhibits no distension and no mass. There is no tenderness. There is no rebound and no guarding.  Musculoskeletal: Normal range of motion. She exhibits no edema or tenderness.  No calf swelling or pain.  Neurological: She is alert and oriented to person, place, and time.  Skin: Skin is warm and dry. No rash noted. No erythema.  Psychiatric: She has a normal mood and affect. Her behavior is normal.  Nursing note and vitals reviewed.   ED Course  Procedures (including critical care time) Labs Review Labs Reviewed  CBC WITH DIFFERENTIAL - Abnormal; Notable for the following:    WBC 10.6 (*)    Neutrophils Relative % 86 (*)    Neutro Abs 9.0 (*)    Lymphocytes Relative  8 (*)    All other components within normal limits  COMPREHENSIVE METABOLIC PANEL - Abnormal; Notable for the following:    Glucose, Bld 139 (*)    Albumin 3.4 (*)    All other components within normal limits  TROPONIN I    Imaging Review Dg Chest Port 1 View  09/11/2014   CLINICAL DATA:  Multiple myeloma, cold symptoms for a few days with shortness of breath over a few hr.  EXAM: PORTABLE CHEST - 1 VIEW  COMPARISON:  Chest radiograph August 02, 2014  FINDINGS: Cardiac silhouette is unremarkable. Mildly calcified aortic knob. No pleural effusions or focal consolidations. No pneumothorax.  Single-lumen RIGHT chest Port-A-Cath with distal tip in distal superior vena cava. Remote RIGHT posterior rib fractures. Patient is osteopenic.  IMPRESSION: No acute cardiopulmonary process.   Electronically Signed   By: Elon Alas   On: 09/11/2014 03:59     EKG Interpretation None      Date: 09/11/2014  Rate: 60  Rhythm: normal sinus rhythm  QRS Axis: normal  Intervals: normal  ST/T Wave abnormalities: normal  Conduction Disutrbances:none  Narrative Interpretation:   Old EKG Reviewed: none available   MDM   Final diagnoses:  SOB (shortness of breath)  COPD exacerbation     Patient states that her  breathing is greatly improved. She still has diffuse expiratory wheezing. Will give hour-long breathing treatment.  Patient now with very mild end expiratory wheeze. She is in no obvious restaurant distress. Her sats are high 90s on room air. No obvious infiltrate on chest x-ray. Albuterol inhaler given in the emergency department. We'll discharge home to follow-up with her primary doctor. She's been given very strict return precautions and has voiced understanding.  Patient's symptoms continue to improve. Anticipate discharge home.  Julianne Rice, MD 09/11/14 [phone]

## 2014-09-11 NOTE — ED Notes (Signed)
Pt ambulated to restroom by self w/o difficulty, pt states shortness of breath is a lot better, pt 93% on RA when walking, when sat back down in bed went up to 98% on RA.

## 2014-09-11 NOTE — ED Notes (Signed)
Pt states she is feeling a lot better after medications. RT called for continuous neb.

## 2014-09-11 NOTE — ED Notes (Signed)
Pt states she is a cancer pt and has multiple myeloma  Pt states she is on chemo medication  Pt states she caught a cold from her husband  Pt states she has been feeling short of breath since mid day and it has progressively gotten worse  Pt appears very short of breath  Resp labored upon arrival

## 2014-09-11 NOTE — Discharge Instructions (Signed)

## 2014-09-12 ENCOUNTER — Telehealth: Payer: Self-pay | Admitting: *Deleted

## 2014-09-12 NOTE — Telephone Encounter (Signed)
09/12/14 at 12:49pm - The research nurse was aware of the pt's ED visit on 09/11/14.  The research nurse took Dr. Edwena Felty ED note to Dr. Alvy Bimler for review.  Dr. Alvy Bimler noted that Dr. Lita Mains prescribed "prednisone 20 mg tablets ( take 3 tabs po day one, then 2 po daily x 4 days) for the pt's COPD exacerbation.  She advised the research nurse to call the pt and inform the pt that on 09/13/14, cycle 2, day 15, to only take the decadron 28 mg prior to the pt's scheduled elotuzumab infusion.  The research nurse verified with Dr. Alvy Bimler that she did not want the pt to take the prednisone along with the decadron 28 mg.  Dr. Alvy Bimler agreed that the pt should "hold the prednisone" and take the decadron 28 mg on 09/13/14.  The research nurse called the pt today to check on her status.  The pt said that she was feeling "some better".  She said that the ED physician told her that it would take a "week or so to feel better".  She asked if she felt that she needed to be seen tomorrow by Dr. Alvy Bimler or an advanced practice provider before her treatment.  The pt said that she did not feel that she needed to be seen by anyone tomorrow.  The research nurse advised the pt to hold her prednisone dose prescribed by Dr. Lita Mains on 09/13/14 only and to take her decadron 28 mg dose prior to her office appt.  The pt verbalized understanding.  The pt knows that she can resume her prednisone tablets on 09/14/14 as prescribed.

## 2014-09-13 ENCOUNTER — Other Ambulatory Visit (HOSPITAL_BASED_OUTPATIENT_CLINIC_OR_DEPARTMENT_OTHER): Payer: 59

## 2014-09-13 ENCOUNTER — Other Ambulatory Visit: Payer: Self-pay | Admitting: Hematology and Oncology

## 2014-09-13 ENCOUNTER — Ambulatory Visit (HOSPITAL_BASED_OUTPATIENT_CLINIC_OR_DEPARTMENT_OTHER): Payer: 59

## 2014-09-13 ENCOUNTER — Ambulatory Visit: Payer: 59

## 2014-09-13 ENCOUNTER — Encounter: Payer: Self-pay | Admitting: *Deleted

## 2014-09-13 DIAGNOSIS — Z95828 Presence of other vascular implants and grafts: Secondary | ICD-10-CM

## 2014-09-13 DIAGNOSIS — Z006 Encounter for examination for normal comparison and control in clinical research program: Secondary | ICD-10-CM

## 2014-09-13 DIAGNOSIS — R05 Cough: Secondary | ICD-10-CM

## 2014-09-13 DIAGNOSIS — I10 Essential (primary) hypertension: Secondary | ICD-10-CM

## 2014-09-13 DIAGNOSIS — C9000 Multiple myeloma not having achieved remission: Secondary | ICD-10-CM

## 2014-09-13 DIAGNOSIS — R059 Cough, unspecified: Secondary | ICD-10-CM

## 2014-09-13 DIAGNOSIS — F17200 Nicotine dependence, unspecified, uncomplicated: Secondary | ICD-10-CM

## 2014-09-13 DIAGNOSIS — Z5112 Encounter for antineoplastic immunotherapy: Secondary | ICD-10-CM

## 2014-09-13 LAB — CBC WITH DIFFERENTIAL/PLATELET
BASO%: 0.4 % (ref 0.0–2.0)
Basophils Absolute: 0 10*3/uL (ref 0.0–0.1)
EOS%: 5.5 % (ref 0.0–7.0)
Eosinophils Absolute: 0.4 10*3/uL (ref 0.0–0.5)
HCT: 43.3 % (ref 34.8–46.6)
HGB: 14.1 g/dL (ref 11.6–15.9)
LYMPH%: 7.5 % — AB (ref 14.0–49.7)
MCH: 30 pg (ref 25.1–34.0)
MCHC: 32.6 g/dL (ref 31.5–36.0)
MCV: 92 fL (ref 79.5–101.0)
MONO#: 0.6 10*3/uL (ref 0.1–0.9)
MONO%: 8.2 % (ref 0.0–14.0)
NEUT#: 6.1 10*3/uL (ref 1.5–6.5)
NEUT%: 78.4 % — ABNORMAL HIGH (ref 38.4–76.8)
Platelets: 164 10*3/uL (ref 145–400)
RBC: 4.71 10*6/uL (ref 3.70–5.45)
RDW: 15.4 % — AB (ref 11.2–14.5)
WBC: 7.8 10*3/uL (ref 3.9–10.3)
lymph#: 0.6 10*3/uL — ABNORMAL LOW (ref 0.9–3.3)

## 2014-09-13 LAB — COMPREHENSIVE METABOLIC PANEL (CC13)
ALT: 15 U/L (ref 0–55)
AST: 13 U/L (ref 5–34)
Albumin: 3.6 g/dL (ref 3.5–5.0)
Alkaline Phosphatase: 99 U/L (ref 40–150)
Anion Gap: 6 mEq/L (ref 3–11)
BUN: 11.7 mg/dL (ref 7.0–26.0)
CALCIUM: 9 mg/dL (ref 8.4–10.4)
CO2: 24 mEq/L (ref 22–29)
Chloride: 108 mEq/L (ref 98–109)
Creatinine: 0.8 mg/dL (ref 0.6–1.1)
Glucose: 144 mg/dl — ABNORMAL HIGH (ref 70–140)
Potassium: 3.9 mEq/L (ref 3.5–5.1)
Sodium: 138 mEq/L (ref 136–145)
TOTAL PROTEIN: 6.2 g/dL — AB (ref 6.4–8.3)
Total Bilirubin: 0.69 mg/dL (ref 0.20–1.20)

## 2014-09-13 MED ORDER — DIPHENHYDRAMINE HCL 25 MG PO CAPS
ORAL_CAPSULE | ORAL | Status: AC
Start: 2014-09-13 — End: 2014-09-13
  Filled 2014-09-13: qty 2

## 2014-09-13 MED ORDER — FAMOTIDINE IN NACL 20-0.9 MG/50ML-% IV SOLN
20.0000 mg | Freq: Once | INTRAVENOUS | Status: AC
  Administered 2014-09-13: 20 mg via INTRAVENOUS

## 2014-09-13 MED ORDER — HEPARIN SOD (PORK) LOCK FLUSH 100 UNIT/ML IV SOLN
500.0000 [IU] | Freq: Once | INTRAVENOUS | Status: AC | PRN
  Administered 2014-09-13: 500 [IU]
  Filled 2014-09-13: qty 5

## 2014-09-13 MED ORDER — ACETAMINOPHEN 325 MG PO TABS
650.0000 mg | ORAL_TABLET | Freq: Once | ORAL | Status: AC
  Administered 2014-09-13: 650 mg via ORAL

## 2014-09-13 MED ORDER — SODIUM CHLORIDE 0.9 % IJ SOLN
10.0000 mL | INTRAMUSCULAR | Status: DC | PRN
  Administered 2014-09-13: 10 mL via INTRAVENOUS
  Filled 2014-09-13: qty 10

## 2014-09-13 MED ORDER — DIPHENHYDRAMINE HCL 25 MG PO CAPS
50.0000 mg | ORAL_CAPSULE | Freq: Once | ORAL | Status: AC
  Administered 2014-09-13: 50 mg via ORAL

## 2014-09-13 MED ORDER — DEXAMETHASONE SODIUM PHOSPHATE 10 MG/ML IJ SOLN
8.0000 mg | Freq: Once | INTRAMUSCULAR | Status: AC
  Administered 2014-09-13: 8 mg via INTRAVENOUS

## 2014-09-13 MED ORDER — SODIUM CHLORIDE 0.9 % IV SOLN
Freq: Once | INTRAVENOUS | Status: AC
  Administered 2014-09-13: 12:00:00 via INTRAVENOUS

## 2014-09-13 MED ORDER — FAMOTIDINE IN NACL 20-0.9 MG/50ML-% IV SOLN
INTRAVENOUS | Status: AC
  Filled 2014-09-13: qty 50

## 2014-09-13 MED ORDER — DEXAMETHASONE SODIUM PHOSPHATE 10 MG/ML IJ SOLN
INTRAMUSCULAR | Status: AC
  Filled 2014-09-13: qty 1

## 2014-09-13 MED ORDER — IPRATROPIUM BROMIDE HFA 17 MCG/ACT IN AERS: 2.0000 | INHALATION_SPRAY | RESPIRATORY_TRACT | Status: DC | PRN

## 2014-09-13 MED ORDER — SODIUM CHLORIDE 0.9 % IV SOLN
10.0000 mg/kg | Freq: Once | INTRAVENOUS | Status: AC
  Administered 2014-09-13: 540 mg via INTRAVENOUS
  Filled 2014-09-13: qty 21.6

## 2014-09-13 MED ORDER — ACETAMINOPHEN 325 MG PO TABS
ORAL_TABLET | ORAL | Status: AC
  Filled 2014-09-13: qty 2

## 2014-09-13 MED ORDER — SODIUM CHLORIDE 0.9 % IJ SOLN
10.0000 mL | INTRAMUSCULAR | Status: DC | PRN
  Administered 2014-09-13: 10 mL
  Filled 2014-09-13: qty 10

## 2014-09-13 NOTE — Progress Notes (Signed)
I was asked to assess the patient due to increasing shortness of breath. Examination showed diffuse expiratory wheezes. The patient felt she is developing some hives after the use of albuterol. I advised her to stop albuterol and switch her to ipratropium instead.

## 2014-09-13 NOTE — Patient Instructions (Signed)

## 2014-09-13 NOTE — Patient Instructions (Signed)
[facility] Discharge Instructions for Patients Receiving Chemotherapy  Today you received the following chemotherapy agents Elotuzumab  To help prevent nausea and vomiting after your treatment, we encourage you to take your nausea medication   If you develop nausea and vomiting that is not controlled by your nausea medication, call the clinic.   BELOW ARE SYMPTOMS THAT SHOULD BE REPORTED IMMEDIATELY:  *FEVER GREATER THAN 100.5 F  *CHILLS WITH OR WITHOUT FEVER  NAUSEA AND VOMITING THAT IS NOT CONTROLLED WITH YOUR NAUSEA MEDICATION  *UNUSUAL SHORTNESS OF BREATH  *UNUSUAL BRUISING OR BLEEDING  TENDERNESS IN MOUTH AND THROAT WITH OR WITHOUT PRESENCE OF ULCERS  *URINARY PROBLEMS  *BOWEL PROBLEMS  UNUSUAL RASH Items with * indicate a potential emergency and should be followed up as soon as possible.  Feel free to call the clinic you have any questions or concerns. The clinic phone number is [phone].                      

## 2014-09-13 NOTE — Progress Notes (Signed)
09/13/14 at 1:01pm - BMS 143- cycle 2, day 15 study notes- The pt was into the cancer center this morning for her labs and cycle 2, day 15 treatment.  The pt called the research nurse this morning on the way to her appointment.  She stated that she wanted to be seen by Dr. Alvy Bimler to see if she was "well enough" for treatment today.  The research nurse contacted Dr. Calton Dach nurse, Cameo, about the pt's request to be seen.  Cameo advised the research nurse that Dr. Calton Dach schedule was full and that Dr. Alvy Bimler would see the pt in the infusion room.  The infusion nurse, Jenny Reichmann, called the research nurse and stated that Dr. Alvy Bimler prescribed additional medications for the pt.  He also said that Dr. Alvy Bimler wanted the pt to continue with her treatment today as planned.  The pt's labs were acceptable for treatment today.  The pt confirmed that she took her decadron 28 mg this morning with food prior to coming to the cancer center.  The pt states she is continuing her Revlimid daily.  The pt's kit assigment for elotuzumab was made through [facility], and taken to the pharmacy.  The research nurse spoke to Jenny Reichmann, infusion nurse, regarding the pt's study drug administration.   The research nurse provided written instructions on the timing of the premedications and the rate of the study drug administration to the infusion nurse. The research nurse emphasized the timing of the premedications with regard to the start of elotuzumab and the requirement of administering through a 0.22 micron filter. Also, provided the nurse with guidance on grading a transfusion reaction and recommendations for managing an infusion reaction. Requested that they call the research department should the patient experience any grade of infusion reaction. RN agreed to all follow all instructions. Patient denied having any questions re: her calendar or treatment regimen. The pt had no questions about her treatment.  She is aware of her  future appointments.

## 2014-09-14 ENCOUNTER — Emergency Department (HOSPITAL_COMMUNITY): Payer: Medicare Other

## 2014-09-14 ENCOUNTER — Encounter (HOSPITAL_COMMUNITY): Payer: Self-pay

## 2014-09-14 ENCOUNTER — Emergency Department (HOSPITAL_COMMUNITY)
Admission: EM | Admit: 2014-09-14 | Discharge: 2014-09-14 | Disposition: A | Discharge status: Home / Self Care | Payer: Medicare Other | Attending: Emergency Medicine | Admitting: Emergency Medicine

## 2014-09-14 ENCOUNTER — Other Ambulatory Visit: Payer: Self-pay

## 2014-09-14 ENCOUNTER — Other Ambulatory Visit: Payer: Self-pay | Admitting: *Deleted

## 2014-09-14 DIAGNOSIS — R0600 Dyspnea, unspecified: Secondary | ICD-10-CM | POA: Diagnosis not present

## 2014-09-14 DIAGNOSIS — M1990 Unspecified osteoarthritis, unspecified site: Secondary | ICD-10-CM | POA: Insufficient documentation

## 2014-09-14 DIAGNOSIS — R0602 Shortness of breath: Secondary | ICD-10-CM | POA: Diagnosis present

## 2014-09-14 DIAGNOSIS — E785 Hyperlipidemia, unspecified: Secondary | ICD-10-CM | POA: Diagnosis not present

## 2014-09-14 DIAGNOSIS — Z8619 Personal history of other infectious and parasitic diseases: Secondary | ICD-10-CM | POA: Diagnosis not present

## 2014-09-14 DIAGNOSIS — Z8579 Personal history of other malignant neoplasms of lymphoid, hematopoietic and related tissues: Secondary | ICD-10-CM | POA: Insufficient documentation

## 2014-09-14 DIAGNOSIS — G8929 Other chronic pain: Secondary | ICD-10-CM | POA: Diagnosis not present

## 2014-09-14 DIAGNOSIS — Z7982 Long term (current) use of aspirin: Secondary | ICD-10-CM | POA: Diagnosis not present

## 2014-09-14 DIAGNOSIS — R0789 Other chest pain: Secondary | ICD-10-CM | POA: Insufficient documentation

## 2014-09-14 DIAGNOSIS — K219 Gastro-esophageal reflux disease without esophagitis: Secondary | ICD-10-CM | POA: Insufficient documentation

## 2014-09-14 DIAGNOSIS — J208 Acute bronchitis due to other specified organisms: Secondary | ICD-10-CM

## 2014-09-14 DIAGNOSIS — I10 Essential (primary) hypertension: Secondary | ICD-10-CM | POA: Diagnosis not present

## 2014-09-14 DIAGNOSIS — Z79899 Other long term (current) drug therapy: Secondary | ICD-10-CM | POA: Insufficient documentation

## 2014-09-14 DIAGNOSIS — Z7952 Long term (current) use of systemic steroids: Secondary | ICD-10-CM | POA: Insufficient documentation

## 2014-09-14 DIAGNOSIS — J209 Acute bronchitis, unspecified: Secondary | ICD-10-CM | POA: Insufficient documentation

## 2014-09-14 DIAGNOSIS — Z86010 Personal history of colonic polyps: Secondary | ICD-10-CM | POA: Insufficient documentation

## 2014-09-14 DIAGNOSIS — Z792 Long term (current) use of antibiotics: Secondary | ICD-10-CM | POA: Diagnosis not present

## 2014-09-14 DIAGNOSIS — F329 Major depressive disorder, single episode, unspecified: Secondary | ICD-10-CM | POA: Diagnosis not present

## 2014-09-14 LAB — CBC WITH DIFFERENTIAL/PLATELET
Basophils Absolute: 0 10*3/uL (ref 0.0–0.1)
Basophils Relative: 0 % (ref 0–1)
EOS ABS: 0 10*3/uL (ref 0.0–0.7)
Eosinophils Relative: 0 % (ref 0–5)
HCT: 41.5 % (ref 36.0–46.0)
HEMOGLOBIN: 13.9 g/dL (ref 12.0–15.0)
LYMPHS ABS: 0.5 10*3/uL — AB (ref 0.7–4.0)
LYMPHS PCT: 7 % — AB (ref 12–46)
MCH: 30.7 pg (ref 26.0–34.0)
MCHC: 33.5 g/dL (ref 30.0–36.0)
MCV: 91.6 fL (ref 78.0–100.0)
Monocytes Absolute: 1 10*3/uL (ref 0.1–1.0)
Monocytes Relative: 14 % — ABNORMAL HIGH (ref 3–12)
NEUTROS PCT: 79 % — AB (ref 43–77)
Neutro Abs: 5.6 10*3/uL (ref 1.7–7.7)
Platelets: 156 10*3/uL (ref 150–400)
RBC: 4.53 MIL/uL (ref 3.87–5.11)
RDW: 15.3 % (ref 11.5–15.5)
WBC: 7.1 10*3/uL (ref 4.0–10.5)

## 2014-09-14 LAB — BASIC METABOLIC PANEL
Anion gap: 17 — ABNORMAL HIGH (ref 5–15)
BUN: 15 mg/dL (ref 6–23)
CO2: 19 meq/L (ref 19–32)
Calcium: 9 mg/dL (ref 8.4–10.5)
Chloride: 105 mEq/L (ref 96–112)
Creatinine, Ser: 0.6 mg/dL (ref 0.50–1.10)
GFR calc Af Amer: >90 mL/min (ref >90)
GFR calc non Af Amer: >90 mL/min (ref >90)
GLUCOSE: 187 mg/dL — AB (ref 70–99)
POTASSIUM: 3.6 meq/L — AB (ref 3.7–5.3)
SODIUM: 141 meq/L (ref 137–147)

## 2014-09-14 LAB — TROPONIN I

## 2014-09-14 MED ORDER — ALBUTEROL (5 MG/ML) CONTINUOUS INHALATION SOLN
10.0000 mg/h | INHALATION_SOLUTION | Freq: Once | RESPIRATORY_TRACT | Status: AC
  Administered 2014-09-14: 10 mg/h via RESPIRATORY_TRACT
  Filled 2014-09-14: qty 20

## 2014-09-14 MED ORDER — AZITHROMYCIN 250 MG PO TABS: 250.0000 mg | ORAL_TABLET | Freq: Every day | ORAL | Status: DC

## 2014-09-14 NOTE — Discharge Instructions (Signed)
If you were given medicines take as directed.  If you are on coumadin or contraceptives realize their levels and effectiveness is altered by many different medicines.  If you have any reaction (rash, tongues swelling, other) to the medicines stop taking and see a physician.   Please follow up as directed and return to the ER or see a physician for new or worsening symptoms.  Thank you. Filed Vitals:   09/14/14 1345 09/14/14 1430 09/14/14 1500 09/14/14 1606  BP: 117/62 119/68 131/66   Pulse: 62 66 60   Temp:      TempSrc:      Resp: 14 17 13    Height:      Weight:      SpO2: 94% 96% 94% 96%    Acute Bronchitis Bronchitis is inflammation of the airways that extend from the windpipe into the lungs (bronchi). The inflammation often causes mucus to develop. This leads to a cough, which is the most common symptom of bronchitis.  In acute bronchitis, the condition usually develops suddenly and goes away over time, usually in a couple weeks. Smoking, allergies, and asthma can make bronchitis worse. Repeated episodes of bronchitis may cause further lung problems.  CAUSES Acute bronchitis is most often caused by the same virus that causes a cold. The virus can spread from person to person (contagious) through coughing, sneezing, and touching contaminated objects. SIGNS AND SYMPTOMS   Cough.   Fever.   Coughing up mucus.   Body aches.   Chest congestion.   Chills.   Shortness of breath.   Sore throat.  DIAGNOSIS  Acute bronchitis is usually diagnosed through a physical exam. Your health care provider will also ask you questions about your medical history. Tests, such as chest X-rays, are sometimes done to rule out other conditions.  TREATMENT  Acute bronchitis usually goes away in a couple weeks. Oftentimes, no medical treatment is necessary. Medicines are sometimes given for relief of fever or cough. Antibiotic medicines are usually not needed but may be prescribed in certain  situations. In some cases, an inhaler may be recommended to help reduce shortness of breath and control the cough. A cool mist vaporizer may also be used to help thin bronchial secretions and make it easier to clear the chest.  HOME CARE INSTRUCTIONS  Get plenty of rest.   Drink enough fluids to keep your urine clear or pale yellow (unless you have a medical condition that requires fluid restriction). Increasing fluids may help thin your respiratory secretions (sputum) and reduce chest congestion, and it will prevent dehydration.   Take medicines only as directed by your health care provider.  If you were prescribed an antibiotic medicine, finish it all even if you start to feel better.  Avoid smoking and secondhand smoke. Exposure to cigarette smoke or irritating chemicals will make bronchitis worse. If you are a smoker, consider using nicotine gum or skin patches to help control withdrawal symptoms. Quitting smoking will help your lungs heal faster.   Reduce the chances of another bout of acute bronchitis by washing your hands frequently, avoiding people with cold symptoms, and trying not to touch your hands to your mouth, nose, or eyes.   Keep all follow-up visits as directed by your health care provider.  SEEK MEDICAL CARE IF: Your symptoms do not improve after 1 week of treatment.  SEEK IMMEDIATE MEDICAL CARE IF:  You develop an increased fever or chills.   You have chest pain.   You have severe  shortness of breath.  You have bloody sputum.   You develop dehydration.  You faint or repeatedly feel like you are going to pass out.  You develop repeated vomiting.  You develop a severe headache. MAKE SURE YOU:   Understand these instructions.  Will watch your condition.  Will get help right away if you are not doing well or get worse. Document Released: 11/26/2004 Document Revised: 03/05/2014 Document Reviewed: 04/11/2013 [facility] Patient Information 2015  [place], [place]. This information is not intended to replace advice given to you by your health care provider. Make sure you discuss any questions you have with your health care provider.

## 2014-09-14 NOTE — ED Provider Notes (Signed)
Patient feeling better after hour-long neb. Still some wheezing on exam. Will ambulate patient with pulse ox to ensure no hypoxia. Patient is already on prednisone at home but will add an antibiotic to cover for atypical infection.  Pt walked without difficulty with normal sats.  She desires to go home and will have close f/u.  Given strict return precautions  Blanchie Dessert, MD 09/14/14 1927

## 2014-09-14 NOTE — ED Notes (Signed)
Bed: OI75 Expected date:  Expected time:  Means of arrival:  Comments: Ems-ASTHMA

## 2014-09-14 NOTE — ED Notes (Signed)
MD at bedside. 

## 2014-09-14 NOTE — Telephone Encounter (Signed)
Refill request for Revlimid to MD desk for approval. Noted patient was currently in hospital as of today. Made her research nurse, Doristine Johns aware.

## 2014-09-14 NOTE — ED Notes (Signed)
During ambulation Pt's O2 sat did not go below 95%

## 2014-09-14 NOTE — ED Notes (Signed)
Pt presents with MAD- Per [facility]- Pt had cold last week. Pt seen here and treated. Given steroid. This AM rescue inhaler 3 times 5am/09/1229 without relief. Pt presented to SOB with accessory muscle use with short words only- NEB treatment in progress. Albuterol 15mg / atrovent 1mg  total given/ solumedrol 125mg / MG 2gram IVP given. Pt improved however wheezing continues. Pt has extreme SOB with exertion. CP with SOB. EKG 76 NSR. Fire on scene reported 83% RA 02 sats. 98% on NEB.

## 2014-09-14 NOTE — ED Notes (Signed)
Per md pt allowed to have small amount of PO fluids, no food until after continuous neb.

## 2014-09-14 NOTE — ED Provider Notes (Signed)
CSN: [id number]     Arrival date & time 09/14/14  1307 History   First MD Initiated Contact with Patient 09/14/14 1331     Chief Complaint  Patient presents with  . Asthma  . Shortness of Breath  . Chest Pain     (Consider location/radiation/quality/duration/timing/severity/associated sxs/prior Treatment) HPI Comments: 63 year-old female with history of multiple myeloma, smoking, high blood pressure, regular chemotherapy last this week presents with worsening nonproductive cough and wheezing the past week. Patient's husband had an upper rest or infection for which she developed $RemoveBefor'Sunday and this had gradually worsening symptoms since. Patient has mild chest tightness with coughing. This is similar to previous bronchitis however lasting longer and more severe. Patient denies cardiac or blood clot history, no recent surgeries. No fevers or chills. No current antibiotics except she has a dose prior to chemotherapy.  Patient is a 63 y.o. female presenting with asthma, shortness of breath, and chest pain. The history is provided by the patient.  Asthma Associated symptoms include shortness of breath. Pertinent negatives include no chest pain, no abdominal pain and no headaches.  Shortness of Breath Associated symptoms: cough   Associated symptoms: no abdominal pain, no chest pain, no fever, no headaches, no neck pain, no rash and no vomiting   Chest Pain Associated symptoms: cough and shortness of breath   Associated symptoms: no abdominal pain, no back pain, no fever, no headache and not vomiting     Past Medical History  Diagnosis Date  . Depression   . Hypertension   . Hepatitis C     genotype 1  . Diverticulosis of colon   . Multiple myeloma     kappa light chain, s/p high-dose chemo/stem cell tx - [facility]  . Hx of adenomatous colonic polyps 2007  . Anxiety   . Osteoarthritis   . Sleep apnea   . Hyperlipidemia   . GERD with stricture   . External hemorrhoids   . Hepatitis B virus  infection   . IBS (irritable bowel syndrome)   . Helicobacter pylori gastritis 2001    ? if treated  . Sciatic nerve pain     with Dr. Jeff Jenkins  . Chronic low back pain   . Unspecified vitamin D deficiency 09/22/2013  . Allergy   . Fever 11/28/2013  . Dehydration 11/28/2013  . Nausea alone 01/22/2014  . Cough 08/02/2014   Past Surgical History  Procedure Laterality Date  . Abdominal hysterectomy    . Oophorectomy    . Dilation and curettage of uterus    . Bone marrow transplant      20'ijEMjTSbIgYh$ 09, 2013 [facility]  . Appendectomy    . Colonoscopy w/ polypectomy  08/2006    1-2 adenomas, 6 polyps total, diverticulosis and external hemorrhoids  . Upper gastrointestinal endoscopy  08/2003    esophageal stricture dilation, hiatal hernia, gastrritis  . Cholecystectomy    . Tonsillectomy and adenoidectomy     Family History  Problem Relation Age of Onset  . Alcohol abuse    . Lung cancer Father     smoked  . Leukemia Mother   . Cirrhosis Sister   . Colon cancer Neg Hx   . Lung cancer Sister     smoked   History  Substance Use Topics  . Smoking status: Current Every Day Smoker -- 0.25 packs/day for 44 years    Types: Cigarettes  . Smokeless tobacco: Never Used  . Alcohol Use: No   OB History  No data available     Review of Systems  Constitutional: Negative for fever and chills.  HENT: Positive for congestion.   Eyes: Negative for visual disturbance.  Respiratory: Positive for cough, chest tightness and shortness of breath.   Cardiovascular: Negative for chest pain and leg swelling.  Gastrointestinal: Negative for vomiting and abdominal pain.  Genitourinary: Negative for dysuria and flank pain.  Musculoskeletal: Negative for back pain, neck pain and neck stiffness.  Skin: Negative for rash.  Neurological: Negative for light-headedness and headaches.      Allergies  Codeine; Ibuprofen; and Albuterol  Home Medications   Prior to Admission medications   Medication Sig  Start Date End Date Taking? Authorizing Provider  aspirin 81 MG tablet Take 81 mg by mouth daily.   Yes Historical Provider, MD  Cholecalciferol (VITAMIN D-3) 5000 UNITS TABS Take 1 tablet by mouth every morning.   Yes Historical Provider, MD  dexamethasone (DECADRON) 4 MG tablet Take as directed by chemotherapy protocol, 7 pills on the day of chemo 07/30/14  Yes Heath Lark, MD  dextromethorphan-guaiFENesin (MUCINEX DM) 30-600 MG per 12 hr tablet Take 1 tablet by mouth 2 (two) times daily as needed for cough (cough).   Yes Historical Provider, MD  ipratropium (ATROVENT HFA) 17 MCG/ACT inhaler Inhale 2 puffs into the lungs every 4 (four) hours as needed for wheezing. 09/13/14  Yes Heath Lark, MD  lidocaine-prilocaine (EMLA) cream Apply 1 application topically as needed. 11/06/13  Yes Heath Lark, MD  magnesium hydroxide (MILK OF MAGNESIA) 400 MG/5ML suspension Take 30 mLs by mouth daily as needed for mild constipation (constipation).    Yes Historical Provider, MD  morphine (MS CONTIN) 60 MG 12 hr tablet Take 60 mg by mouth every 12 (twelve) hours.   Yes Historical Provider, MD  morphine (MSIR) 30 MG tablet Take 30 mg by mouth every 4 (four) hours as needed for severe pain (pain).    Yes Historical Provider, MD  predniSONE (DELTASONE) 20 MG tablet 3 tabs po day one, then 2 po daily x 4 days 09/11/14  Yes Julianne Rice, MD  Alum & Mag Hydroxide-Simeth (MAGIC MOUTHWASH) SOLN Take 10 mLs by mouth 4 (four) times daily as needed for mouth pain. Swish and spit  Or  Swish and swallow. 03/29/14   Heath Lark, MD  amoxicillin-clavulanate (AUGMENTIN) 875-125 MG per tablet Take 1 tablet by mouth 2 (two) times daily. 09/21/14   Tanda Rockers, MD  lenalidomide (REVLIMID) 25 MG capsule TAKE ONE CAPSULE BY MOUTH EVERY DAY FOR 21 DAYS THEN 7 DAYS OFF. 09/18/14   Heath Lark, MD  olmesartan (BENICAR) 40 MG tablet Take 1 tablet (40 mg total) by mouth daily. Patient not taking: Reported on 09/26/2014 09/21/14   Tanda Rockers, MD  prochlorperazine (COMPAZINE) 10 MG tablet Take 1 tablet (10 mg total) by mouth every 6 (six) hours as needed for nausea. 01/22/14   Ni Gorsuch, MD   BP 125/70 mmHg  Pulse 80  Temp(Src) 98.5 F (36.9 C) (Oral)  Resp 18  Ht $R'5\' 4"'dc$  (1.626 m)  Wt 116 lb (52.617 kg)  BMI 19.90 kg/m2  SpO2 98% Physical Exam  Constitutional: She is oriented to person, place, and time. She appears well-developed and well-nourished.  HENT:  Head: Normocephalic and atraumatic.  Eyes: Conjunctivae are normal. Right eye exhibits no discharge. Left eye exhibits no discharge.  Neck: Normal range of motion. Neck supple. No tracheal deviation present.  Cardiovascular: Normal rate and regular rhythm.  Pulmonary/Chest: Effort normal. She has wheezes (expiratory bilateralhome mild tachypnea, few rhonchi at bases bilateral).  Abdominal: Soft. She exhibits no distension. There is no tenderness. There is no guarding.  Musculoskeletal: She exhibits no edema.  Neurological: She is alert and oriented to person, place, and time.  Skin: Skin is warm. No rash noted.  Psychiatric: She has a normal mood and affect.  Nursing note and vitals reviewed.   ED Course  Procedures (including critical care time) Labs Review Labs Reviewed  CBC WITH DIFFERENTIAL - Abnormal; Notable for the following:    Neutrophils Relative % 79 (*)    Lymphocytes Relative 7 (*)    Lymphs Abs 0.5 (*)    Monocytes Relative 14 (*)    All other components within normal limits  BASIC METABOLIC PANEL - Abnormal; Notable for the following:    Potassium 3.6 (*)    Glucose, Bld 187 (*)    Anion gap 17 (*)    All other components within normal limits  TROPONIN I    Imaging Review No results found. Dg Chest 2 View  09/14/2014   CLINICAL DATA:  Dyspnea, productive cough, history hypertension, hepatitis-C, multiple myeloma, GERD, smoker  EXAM: CHEST  2 VIEW  COMPARISON:  09/11/2014  FINDINGS: RIGHT jugular Port-A-Cath with tip projecting over  mid SVC.  Normal heart size and pulmonary vascularity.  Tortuous aorta with atherosclerotic calcification.  Minimal bronchitic changes without infiltrate, pleural effusion or pneumothorax.  No acute osseous findings.  IMPRESSION: Minimal bronchitic changes without acute infiltrate.   Electronically Signed   By: Lavonia Dana M.D.   On: 09/14/2014 14:13   Dg Chest Port 1 View  09/11/2014   CLINICAL DATA:  Multiple myeloma, cold symptoms for a few days with shortness of breath over a few hr.  EXAM: PORTABLE CHEST - 1 VIEW  COMPARISON:  Chest radiograph August 02, 2014  FINDINGS: Cardiac silhouette is unremarkable. Mildly calcified aortic knob. No pleural effusions or focal consolidations. No pneumothorax.  Single-lumen RIGHT chest Port-A-Cath with distal tip in distal superior vena cava. Remote RIGHT posterior rib fractures. Patient is osteopenic.  IMPRESSION: No acute cardiopulmonary process.   Electronically Signed   By: Elon Alas   On: 09/11/2014 03:59      EKG Interpretation   Date/Time:  Friday September 14 2014 13:24:08 EST Ventricular Rate:  71 PR Interval:  141 QRS Duration: 87 QT Interval:  390 QTC Calculation: 424 R Axis:   39 Text Interpretation:  Sinus rhythm Abnormal R-wave progression, early  transition Minimal ST depression, inferior leads ED PHYSICIAN  INTERPRETATION AVAILABLE IN [facility] Confirmed by TEST, Record  ([id number]) on 09/16/2014 12:07:16 PM     EKG reviewed heart rate 71, normal QT, no acute ST elevation, poor R wave progression\  MDM   Final diagnoses:  Dyspnea  Acute bronchitis due to other specified organisms   Patient presents clinically with bronchitis/wheezing/COPD exacerbation Chest x-ray reviewed no acute infiltrates. Patient significant wheezing bilateral, continuous neb ordered. Patient receivedIV steroids prior to arrival. Patient has albuterol at home.  Patient just starting to receive continuous neb. Patient's care signed out with plan  to recheck for final disposition.  Filed Vitals:   09/14/14 1430 09/14/14 1500 09/14/14 1606 09/14/14 2003  BP: 119/68 131/66  125/70  Pulse: 66 60  80  Temp:    98.5 F (36.9 C)  TempSrc:    Oral  Resp: $Remo'17 13  18  'nyznm$ Height:      Weight:  SpO2: 96% 94% 96% 98%     Mariea Clonts, MD 10/01/14 [phone]

## 2014-09-17 ENCOUNTER — Telehealth: Payer: Self-pay | Admitting: Internal Medicine

## 2014-09-17 LAB — SPEP & IFE WITH QIG
ALPHA-2-GLOBULIN: 15 % — AB (ref 7.1–11.8)
Albumin ELP: 59.4 % (ref 55.8–66.1)
Alpha-1-Globulin: 6.6 % — ABNORMAL HIGH (ref 2.9–4.9)
BETA 2: 3.1 % — AB (ref 3.2–6.5)
Beta Globulin: 6.6 % (ref 4.7–7.2)
Gamma Globulin: 9.3 % — ABNORMAL LOW (ref 11.1–18.8)
IGG (IMMUNOGLOBIN G), SERUM: 594 mg/dL — AB (ref 690–1700)
IgA: 46 mg/dL — ABNORMAL LOW (ref 69–380)
IgM, Serum: 38 mg/dL — ABNORMAL LOW (ref 52–322)
M-SPIKE, %: 0.13 g/dL
Total Protein, Serum Electrophoresis: 6.1 g/dL (ref 6.0–8.3)

## 2014-09-17 LAB — KAPPA/LAMBDA LIGHT CHAINS
Kappa free light chain: 3.57 mg/dL — ABNORMAL HIGH (ref 0.33–1.94)
Kappa:Lambda Ratio: 4.41 — ABNORMAL HIGH (ref 0.26–1.65)
LAMBDA FREE LGHT CHN: 0.81 mg/dL (ref 0.57–2.63)

## 2014-09-17 LAB — BETA 2 MICROGLOBULIN, SERUM: Beta-2 Microglobulin: 2.09 mg/L (ref <=2.51)

## 2014-09-17 NOTE — Telephone Encounter (Signed)
Pt scheduled for OV with MW 09/21/14 at 3:15 Aware to bring all meds to appt for Dr Melvyn Novas to review.  Nothing further needed.

## 2014-09-17 NOTE — Telephone Encounter (Signed)
Ov with all active meds in hand next avail me or Tammy NP

## 2014-09-17 NOTE — Telephone Encounter (Signed)
Called and spoke to pt. Pt c/o increase in SOB, prod cough with brown mucus and chest tightness when coughing. Pt went to the ED on 11/10 and was prescribed pred taper and also went on 11/13 and was given zpak. Pt is still currently taking both medications. Pt stated she has noticed a slight improvement with her cough but it is not resolving completely. Pt denies PND, swelling, f/c/s. Pt denied taking anything OTC.   MW please advise.   Allergies  Allergen Reactions  . Codeine Itching  . Ibuprofen Other (See Comments)    REACTION: gi trouble  . Albuterol Hives

## 2014-09-18 ENCOUNTER — Other Ambulatory Visit: Payer: Self-pay | Admitting: *Deleted

## 2014-09-18 DIAGNOSIS — C9000 Multiple myeloma not having achieved remission: Secondary | ICD-10-CM

## 2014-09-18 MED ORDER — LENALIDOMIDE 25 MG PO CAPS: ORAL_CAPSULE | ORAL | Status: DC

## 2014-09-18 NOTE — Telephone Encounter (Signed)
THIS REFILL REQUEST FOR REVLIMID WAS PLACED ON DR.GORSUCH'S DESK. 

## 2014-09-19 ENCOUNTER — Telehealth: Payer: Self-pay | Admitting: *Deleted

## 2014-09-19 NOTE — Telephone Encounter (Signed)
-----   Message from Heath Lark, MD sent at 09/19/2014  1:44 PM EST ----- resume ----- Message -----    From: Patton Salles, RN    Sent: 09/19/2014   1:05 PM      To: Heath Lark, MD  Had a call from [employer]. Are they to resume Revlimid? They saw it had been put on hold after ED visit. Kendra Johns

## 2014-09-19 NOTE — Telephone Encounter (Signed)
Left message for [employer]- Jasmine @ [phone]- to resume Revlimid

## 2014-09-19 NOTE — Telephone Encounter (Signed)
RECEIVED A FAX FROM [facility] CONCERNING A CONFIRMATION OF FACSIMILE RECEIPT FOR PT. REFERRAL. 

## 2014-09-20 ENCOUNTER — Ambulatory Visit (HOSPITAL_BASED_OUTPATIENT_CLINIC_OR_DEPARTMENT_OTHER): Payer: 59

## 2014-09-20 ENCOUNTER — Other Ambulatory Visit (HOSPITAL_BASED_OUTPATIENT_CLINIC_OR_DEPARTMENT_OTHER): Payer: 59

## 2014-09-20 ENCOUNTER — Encounter: Payer: Self-pay | Admitting: *Deleted

## 2014-09-20 ENCOUNTER — Other Ambulatory Visit: Payer: Self-pay | Admitting: Hematology and Oncology

## 2014-09-20 VITALS — BP 150/88 | HR 83 | Temp 97.8°F | Resp 18

## 2014-09-20 DIAGNOSIS — Z006 Encounter for examination for normal comparison and control in clinical research program: Secondary | ICD-10-CM

## 2014-09-20 DIAGNOSIS — Z5112 Encounter for antineoplastic immunotherapy: Secondary | ICD-10-CM

## 2014-09-20 DIAGNOSIS — C9000 Multiple myeloma not having achieved remission: Secondary | ICD-10-CM

## 2014-09-20 LAB — CBC WITH DIFFERENTIAL/PLATELET
BASO%: 0.4 % (ref 0.0–2.0)
Basophils Absolute: 0 10*3/uL (ref 0.0–0.1)
EOS%: 6.9 % (ref 0.0–7.0)
Eosinophils Absolute: 0.6 10*3/uL — ABNORMAL HIGH (ref 0.0–0.5)
HCT: 47 % — ABNORMAL HIGH (ref 34.8–46.6)
HGB: 15.8 g/dL (ref 11.6–15.9)
LYMPH#: 0.8 10*3/uL — AB (ref 0.9–3.3)
LYMPH%: 9.5 % — ABNORMAL LOW (ref 14.0–49.7)
MCH: 30.9 pg (ref 25.1–34.0)
MCHC: 33.6 g/dL (ref 31.5–36.0)
MCV: 91.8 fL (ref 79.5–101.0)
MONO#: 0.5 10*3/uL (ref 0.1–0.9)
MONO%: 6 % (ref 0.0–14.0)
NEUT#: 6.3 10*3/uL (ref 1.5–6.5)
NEUT%: 77.2 % — ABNORMAL HIGH (ref 38.4–76.8)
Platelets: 219 10*3/uL (ref 145–400)
RBC: 5.12 10*6/uL (ref 3.70–5.45)
RDW: 15.8 % — AB (ref 11.2–14.5)
WBC: 8.1 10*3/uL (ref 3.9–10.3)

## 2014-09-20 LAB — COMPREHENSIVE METABOLIC PANEL (CC13)
ALT: 21 U/L (ref 0–55)
ANION GAP: 10 meq/L (ref 3–11)
AST: 17 U/L (ref 5–34)
Albumin: 4 g/dL (ref 3.5–5.0)
Alkaline Phosphatase: 105 U/L (ref 40–150)
BUN: 11 mg/dL (ref 7.0–26.0)
CHLORIDE: 102 meq/L (ref 98–109)
CO2: 29 mEq/L (ref 22–29)
Calcium: 10.2 mg/dL (ref 8.4–10.4)
Creatinine: 0.9 mg/dL (ref 0.6–1.1)
GLUCOSE: 146 mg/dL — AB (ref 70–140)
Potassium: 3.6 mEq/L (ref 3.5–5.1)
Sodium: 141 mEq/L (ref 136–145)
TOTAL PROTEIN: 7.2 g/dL (ref 6.4–8.3)
Total Bilirubin: 1.01 mg/dL (ref 0.20–1.20)

## 2014-09-20 MED ORDER — HEPARIN SOD (PORK) LOCK FLUSH 100 UNIT/ML IV SOLN
500.0000 [IU] | Freq: Once | INTRAVENOUS | Status: AC | PRN
  Administered 2014-09-20: 500 [IU]
  Filled 2014-09-20: qty 5

## 2014-09-20 MED ORDER — ACETAMINOPHEN 325 MG PO TABS
650.0000 mg | ORAL_TABLET | Freq: Once | ORAL | Status: AC
  Administered 2014-09-20: 650 mg via ORAL

## 2014-09-20 MED ORDER — ACETAMINOPHEN 325 MG PO TABS
ORAL_TABLET | ORAL | Status: AC
  Filled 2014-09-20: qty 2

## 2014-09-20 MED ORDER — DIPHENHYDRAMINE HCL 25 MG PO CAPS
50.0000 mg | ORAL_CAPSULE | Freq: Once | ORAL | Status: AC
  Administered 2014-09-20: 50 mg via ORAL

## 2014-09-20 MED ORDER — SODIUM CHLORIDE 0.9 % IV SOLN
Freq: Once | INTRAVENOUS | Status: AC
  Administered 2014-09-20: 12:00:00 via INTRAVENOUS

## 2014-09-20 MED ORDER — FAMOTIDINE IN NACL 20-0.9 MG/50ML-% IV SOLN
INTRAVENOUS | Status: AC
  Filled 2014-09-20: qty 50

## 2014-09-20 MED ORDER — DEXAMETHASONE SODIUM PHOSPHATE 10 MG/ML IJ SOLN
INTRAMUSCULAR | Status: AC
  Filled 2014-09-20: qty 1

## 2014-09-20 MED ORDER — SODIUM CHLORIDE 0.9 % IJ SOLN
10.0000 mL | INTRAMUSCULAR | Status: DC | PRN
  Administered 2014-09-20: 10 mL
  Filled 2014-09-20: qty 10

## 2014-09-20 MED ORDER — SODIUM CHLORIDE 0.9 % IV SOLN
10.0000 mg/kg | Freq: Once | INTRAVENOUS | Status: AC
  Administered 2014-09-20: 540 mg via INTRAVENOUS
  Filled 2014-09-20: qty 21.6

## 2014-09-20 MED ORDER — DIPHENHYDRAMINE HCL 25 MG PO CAPS
ORAL_CAPSULE | ORAL | Status: AC
  Filled 2014-09-20: qty 2

## 2014-09-20 MED ORDER — FAMOTIDINE IN NACL 20-0.9 MG/50ML-% IV SOLN
20.0000 mg | Freq: Once | INTRAVENOUS | Status: AC
  Administered 2014-09-20: 20 mg via INTRAVENOUS

## 2014-09-20 MED ORDER — DEXAMETHASONE SODIUM PHOSPHATE 10 MG/ML IJ SOLN
8.0000 mg | Freq: Once | INTRAMUSCULAR | Status: AC
  Administered 2014-09-20: 8 mg via INTRAVENOUS

## 2014-09-20 NOTE — Patient Instructions (Signed)
[facility] Discharge Instructions for Patients Receiving Chemotherapy  Today you received the following chemotherapy agents Elotuzumab  To help prevent nausea and vomiting after your treatment, we encourage you to take your nausea medication Compazine 10 mg every 6 hours as needed   If you develop nausea and vomiting that is not controlled by your nausea medication, call the clinic.   BELOW ARE SYMPTOMS THAT SHOULD BE REPORTED IMMEDIATELY:  *FEVER GREATER THAN 100.5 F  *CHILLS WITH OR WITHOUT FEVER  NAUSEA AND VOMITING THAT IS NOT CONTROLLED WITH YOUR NAUSEA MEDICATION  *UNUSUAL SHORTNESS OF BREATH  *UNUSUAL BRUISING OR BLEEDING  TENDERNESS IN MOUTH AND THROAT WITH OR WITHOUT PRESENCE OF ULCERS  *URINARY PROBLEMS  *BOWEL PROBLEMS  UNUSUAL RASH Items with * indicate a potential emergency and should be followed up as soon as possible.  Feel free to call the clinic you have any questions or concerns. The clinic phone number is [phone].    

## 2014-09-20 NOTE — Progress Notes (Signed)
09/20/2014 at 2:19pm The pt was into the cancer center this morning for her labs and cycle 2, day 22 treatment. The pt's labs were acceptable for treatment today. The pt confirmed that she took her decadron 28 mg this morning with food prior to coming to the cancer center. The pt states she stopped her Revlimid yesterday. The research nurse spoke to Dr. Alvy Bimler about the pt's labs drawn last week for disease assessment. Dr. Alvy Bimler stated the pt is responding well to her treatment.  Dr. Alvy Bimler said that she has ordered her Revlimid for cycle 3 already. The pt's kit assigment for elotuzumab was made through [facility], and taken to the pharmacy. The research nurse spoke to [place], infusion nurse, regarding the pt's study drug administration. The research nurse provided written instructions on the timing of the premedications and the rate of the study drug administration to the infusion nurse. The research nurse emphasized the timing of the premedications with regard to the start of elotuzumab and the requirement of administering through a 0.22 micron filter. Also, provided the nurse with guidance on grading a transfusion reaction and recommendations for managing an infusion reaction. Requested that they call the research department should the patient experience any grade of infusion reaction. RN agreed to all follow all instructions. Patient denied having any questions re: her calendar or treatment regimen.  The pt was informed that her labs from last week showed that she is responding well to the treatment.  The pt was very happy to hear the good news. The pt had no questions about her treatment. She is aware of her future appointments.

## 2014-09-21 ENCOUNTER — Ambulatory Visit (INDEPENDENT_AMBULATORY_CARE_PROVIDER_SITE_OTHER): Payer: 59 | Admitting: Internal Medicine

## 2014-09-21 ENCOUNTER — Encounter: Payer: Self-pay | Admitting: Internal Medicine

## 2014-09-21 VITALS — BP 140/90 | HR 77 | Temp 98.8°F | Ht 64.0 in | Wt 123.0 lb

## 2014-09-21 DIAGNOSIS — I10 Essential (primary) hypertension: Secondary | ICD-10-CM

## 2014-09-21 DIAGNOSIS — F17210 Nicotine dependence, cigarettes, uncomplicated: Secondary | ICD-10-CM

## 2014-09-21 DIAGNOSIS — Z720 Tobacco use: Secondary | ICD-10-CM

## 2014-09-21 DIAGNOSIS — F17200 Nicotine dependence, unspecified, uncomplicated: Secondary | ICD-10-CM

## 2014-09-21 DIAGNOSIS — R0602 Shortness of breath: Secondary | ICD-10-CM

## 2014-09-21 MED ORDER — AMOXICILLIN-POT CLAVULANATE 875-125 MG PO TABS: 1.0000 | ORAL_TABLET | Freq: Two times a day (BID) | ORAL | Status: DC

## 2014-09-21 MED ORDER — OLMESARTAN MEDOXOMIL 40 MG PO TABS: 40.0000 mg | ORAL_TABLET | Freq: Every day | ORAL | Status: DC

## 2014-09-21 NOTE — Patient Instructions (Addendum)
I strongly recommend you stop lisinopril   Benicar 40 mg one daily x 2 weeks  Augmentin 875 mg take one pill twice daily  X 10 days - take at breakfast and supper with large glass of water.  It would help reduce the usual side effects (diarrhea and yeast infections) if you ate cultured yogurt at lunch.   Mucinex dm 1200 mg every 12 hours as needed  If still short or breath or cough use atrovent hfa 2 pffs every 4 hours as needed   If still coughing >  Try prilosec 20mg   Take 30-60 min before first meal of the day and Pepcid 20 mg one bedtime until cough is completely gone for at least a week without the need for cough suppression.  I think of reflux for chronic cough like I do oxygen for fire (doesn't cause the fire but once you get the oxygen suppressed it usually goes away regardless of the exact cause).   Please schedule a follow up office visit in 2 weeks to see Tammy NP, sooner if needed and she will check your blood pressure and be sure you are better and see me for pfts

## 2014-09-21 NOTE — Progress Notes (Signed)
Subjective:    Patient ID: Kendra Johns, female    DOB: 1951-05-10  MRN: [id number]     Brief patient profile:  21 yowf active smoker with MM s/p stem cell 2009 and 2013 with recurrence requiring chemo since 2013 but not working so RT to   lower ribs both sides Dec 2014  started new treatment of Jan 2015 and monthly until Jun 5th  p doe which started in April, worse  By end of May  ? Better since late May (? p d/c ACEi?)with last rx June 5th referred by Dr Sherren Mocha for eval of sob.   History of Present Illness  From chart: 10/10/2013 - 10/20/2013  Radiation Therapy  Started on palliative XRT for rib pain  11/27/2013  Carfilzomib d/c 04/06/14 last dose   05/16/2014 1st [facility] office visit/ Tawana Pasch  Chief Complaint  Patient presents with  . Pulmonary Consult    Referred per Dr. Sherren Mocha. Pt c/o SOB for since started on chemo in March 2015.  She states that she is SOB with or without exertion.  She gets out of breath with walking approx 100 ft.   whereas at one point could not do the aisle at [facility] and now can . No cough since off ACEi  Not using saba now but seemed to help cough and congestion when she was at her worst. Not sure symbicort helped No purulent or excessive mucus rec Please remember to go to the x-ray department downstairs for your tests - we will call you with the results when they are available. The key is to stop smoking completely before smoking completely stops you!  Please schedule a follow up office visit in 6 weeks, call sooner if needed - do not restart the lisinopril in meantime as there are plenty of other choices if your blood pressure rises again - see me or Dr Sherren Mocha if needed in meantime Late add : pfts scheduled for return    09/21/2014 acute ov/Savan Ruta re: active smoker/ on ACEi/ refractory cough and wheeze  Chief Complaint  Patient presents with  . Acute Visit    Pt c/o SOB and cough for the past 10 days. Cough is prod with brown/blood tinged sputum.   rx  by ED x 7 days prior to OV  Zpak/ prednisone some better using both saba hfa and neb > 4 x daily since acute onset on symptoms 10 d prior to OV    No obvious day to day or daytime variabilty or assoc  cp or chest tightness, subjective wheeze overt sinus or hb symptoms. No unusual exp hx or h/o childhood pna/ asthma or knowledge of premature birth.  Sleeping ok without nocturnal  or early am exacerbation  of respiratory  c/o's or need for noct saba. Also denies any obvious fluctuation of symptoms with weather or environmental changes or other aggravating or alleviating factors except as outlined above   Current Medications, Allergies, Complete Past Medical History, Past Surgical History, Family History, and Social History were reviewed in [employer] record.  ROS  The following are not active complaints unless bolded sore throat, dysphagia, dental problems, itching, sneezing,  nasal congestion or excess/ purulent secretions, ear ache,   fever, chills, sweats, unintended wt loss, pleuritic or exertional cp, hemoptysis,  orthopnea pnd or leg swelling, presyncope, palpitations, heartburn, abdominal pain, anorexia, nausea, vomiting, diarrhea  or change in bowel or urinary habits, change in stools or urine, dysuria,hematuria,  rash, arthralgias, visual complaints, headache,  numbness weakness or ataxia or problems with walking or coordination,  change in mood/affect or memory.                                Objective:   Physical Exam  . Wt Readings from Last 3 Encounters:  09/21/14 123 lb (55.792 kg)  09/14/14 116 lb (52.617 kg)  08/30/14 119 lb 9.6 oz (54.25 kg)    Vital signs reviewed     HEENT: nl dentition, turbinates, and orophanx. Nl external ear canals without cough reflex   NECK :  without JVD/Nodes/TM/ nl carotid upstrokes bilaterally   LUNGS: no acc muscle use,  Clear on insp but harsh upper and lower airway wheeze/cough on exp maneuvers    CV:   RRR  no s3 or murmur or increase in P2, no edema   ABD:  soft and nontender with nl excursion in the supine position. No bruits or organomegaly, bowel sounds nl  MS:  warm without deformities, calf tenderness, cyanosis or clubbing  SKIN: warm and dry without lesions    NEURO:  alert, approp, no deficits  .        CXR  09/14/14  Minimal bronchitic changes without acute infiltrate.  Assessment & Plan:

## 2014-09-22 NOTE — Assessment & Plan Note (Addendum)
05/14/2014  Walked RA x 3 laps @ 185 ft each stopped due to end of study, no sob, nl pace  - pfts rec 05/17/2014 >>> never done  Symptoms are markedly disproportionate to objective findings and not clear this is a lung problem but pt does appear to have difficult airway management issues. DDX of  difficult airways management all start with A and  include Adherence, Ace Inhibitors, Acid Reflux, Active Sinus Disease, Alpha 1 Antitripsin deficiency, Anxiety masquerading as Airways dz,  ABPA,  allergy(esp in young), Aspiration (esp in elderly), Adverse effects of DPI,  Active smokers, plus two Bs  = Bronchiectasis and Beta blocker use..and one C= CHF  acei at top of usual list of suspects > needs trial off see hbp  ? Active sinus dz > Augmentin 875 mg take one pill twice daily  X 10 days    ? Acid (or non-acid) GERD > always difficult to exclude as up to 75% of pts in some series report no assoc GI/ Heartburn symptoms> rec if not improving  max (24h)  acid suppression and diet restrictions/ reviewed and instructions given in writing.  Active smoking also big concern, discussed separately

## 2014-09-22 NOTE — Assessment & Plan Note (Signed)
Pt d/c acei ? May 2015 on her own > cough and breathing improved by time of pulmonary eval 05/17/2014   ACE inhibitors are problematic in  pts with airway complaints because  even experienced pulmonologists can't always distinguish ace effects from copd/asthma.  By themselves they don't actually cause a problem, much like oxygen can't by itself start a fire, but they certainly serve as a powerful catalyst or enhancer for any "fire"  or inflammatory process in the upper airway, be it caused by an ET  tube or more commonly reflux (especially in the obese or pts with known GERD or who are on biphoshonates).    In the era of ARB near equivalency until we have a better handle on the reversibility of the airway problem, it just makes sense to avoid ACEI  entirely  Given how difficult it is to tease out cause and effect clinically from bronchitic symptoms  Try benicar 40 mg daily and f/u in 2 weeks

## 2014-09-22 NOTE — Assessment & Plan Note (Signed)

## 2014-09-25 ENCOUNTER — Other Ambulatory Visit: Payer: Self-pay | Admitting: Hematology and Oncology

## 2014-09-25 DIAGNOSIS — C9000 Multiple myeloma not having achieved remission: Secondary | ICD-10-CM

## 2014-09-25 NOTE — Telephone Encounter (Signed)
RECEIVED A FAX FROM [facility] CONCERNING A CONFIRMATION OF PRESCRIPTION SHIPMENT FOR REVLIMID ON 09/24/14.

## 2014-09-26 ENCOUNTER — Ambulatory Visit (HOSPITAL_BASED_OUTPATIENT_CLINIC_OR_DEPARTMENT_OTHER): Payer: Medicare Other

## 2014-09-26 ENCOUNTER — Encounter: Payer: Self-pay | Admitting: Hematology and Oncology

## 2014-09-26 ENCOUNTER — Telehealth: Payer: Self-pay | Admitting: Hematology and Oncology

## 2014-09-26 ENCOUNTER — Encounter: Payer: Self-pay | Admitting: *Deleted

## 2014-09-26 ENCOUNTER — Other Ambulatory Visit (HOSPITAL_BASED_OUTPATIENT_CLINIC_OR_DEPARTMENT_OTHER): Payer: Medicare Other

## 2014-09-26 ENCOUNTER — Ambulatory Visit (HOSPITAL_BASED_OUTPATIENT_CLINIC_OR_DEPARTMENT_OTHER): Payer: Medicare Other | Admitting: Hematology and Oncology

## 2014-09-26 VITALS — BP 143/87 | HR 80 | Temp 98.0°F | Resp 98 | Ht 64.0 in | Wt 118.0 lb

## 2014-09-26 DIAGNOSIS — Z006 Encounter for examination for normal comparison and control in clinical research program: Secondary | ICD-10-CM

## 2014-09-26 DIAGNOSIS — C9000 Multiple myeloma not having achieved remission: Secondary | ICD-10-CM

## 2014-09-26 DIAGNOSIS — F17200 Nicotine dependence, unspecified, uncomplicated: Secondary | ICD-10-CM

## 2014-09-26 DIAGNOSIS — R059 Cough, unspecified: Secondary | ICD-10-CM

## 2014-09-26 DIAGNOSIS — Z720 Tobacco use: Secondary | ICD-10-CM

## 2014-09-26 DIAGNOSIS — Z5112 Encounter for antineoplastic immunotherapy: Secondary | ICD-10-CM

## 2014-09-26 DIAGNOSIS — R05 Cough: Secondary | ICD-10-CM

## 2014-09-26 LAB — COMPREHENSIVE METABOLIC PANEL (CC13)
ALBUMIN: 3.8 g/dL (ref 3.5–5.0)
ALT: 17 U/L (ref 0–55)
AST: 16 U/L (ref 5–34)
Alkaline Phosphatase: 87 U/L (ref 40–150)
Anion Gap: 9 mEq/L (ref 3–11)
BUN: 18.3 mg/dL (ref 7.0–26.0)
CALCIUM: 9.7 mg/dL (ref 8.4–10.4)
CHLORIDE: 107 meq/L (ref 98–109)
CO2: 24 mEq/L (ref 22–29)
Creatinine: 0.8 mg/dL (ref 0.6–1.1)
Glucose: 115 mg/dl (ref 70–140)
POTASSIUM: 3.6 meq/L (ref 3.5–5.1)
Sodium: 141 mEq/L (ref 136–145)
Total Bilirubin: 0.58 mg/dL (ref 0.20–1.20)
Total Protein: 6.8 g/dL (ref 6.4–8.3)

## 2014-09-26 LAB — CBC WITH DIFFERENTIAL/PLATELET
BASO%: 1 % (ref 0.0–2.0)
BASOS ABS: 0.1 10*3/uL (ref 0.0–0.1)
EOS%: 0.4 % (ref 0.0–7.0)
Eosinophils Absolute: 0 10*3/uL (ref 0.0–0.5)
HCT: 46.3 % (ref 34.8–46.6)
HEMOGLOBIN: 15.2 g/dL (ref 11.6–15.9)
LYMPH%: 9.9 % — ABNORMAL LOW (ref 14.0–49.7)
MCH: 30.9 pg (ref 25.1–34.0)
MCHC: 32.8 g/dL (ref 31.5–36.0)
MCV: 94.1 fL (ref 79.5–101.0)
MONO#: 0.2 10*3/uL (ref 0.1–0.9)
MONO%: 2.4 % (ref 0.0–14.0)
NEUT#: 5.5 10*3/uL (ref 1.5–6.5)
NEUT%: 86.3 % — ABNORMAL HIGH (ref 38.4–76.8)
Platelets: 221 10*3/uL (ref 145–400)
RBC: 4.92 10*6/uL (ref 3.70–5.45)
RDW: 17.4 % — AB (ref 11.2–14.5)
WBC: 6.4 10*3/uL (ref 3.9–10.3)
lymph#: 0.6 10*3/uL — ABNORMAL LOW (ref 0.9–3.3)

## 2014-09-26 MED ORDER — DEXAMETHASONE SODIUM PHOSPHATE 10 MG/ML IJ SOLN
INTRAMUSCULAR | Status: AC
  Filled 2014-09-26: qty 1

## 2014-09-26 MED ORDER — DIPHENHYDRAMINE HCL 25 MG PO CAPS
ORAL_CAPSULE | ORAL | Status: AC
  Filled 2014-09-26: qty 2

## 2014-09-26 MED ORDER — ACETAMINOPHEN 325 MG PO TABS
650.0000 mg | ORAL_TABLET | Freq: Once | ORAL | Status: AC
  Administered 2014-09-26: 650 mg via ORAL

## 2014-09-26 MED ORDER — FAMOTIDINE IN NACL 20-0.9 MG/50ML-% IV SOLN
20.0000 mg | Freq: Once | INTRAVENOUS | Status: AC
  Administered 2014-09-26: 20 mg via INTRAVENOUS

## 2014-09-26 MED ORDER — SODIUM CHLORIDE 0.9 % IJ SOLN
10.0000 mL | INTRAMUSCULAR | Status: DC | PRN
  Administered 2014-09-26: 10 mL
  Filled 2014-09-26: qty 10

## 2014-09-26 MED ORDER — HEPARIN SOD (PORK) LOCK FLUSH 100 UNIT/ML IV SOLN
500.0000 [IU] | Freq: Once | INTRAVENOUS | Status: AC | PRN
  Administered 2014-09-26: 500 [IU]
  Filled 2014-09-26: qty 5

## 2014-09-26 MED ORDER — FAMOTIDINE IN NACL 20-0.9 MG/50ML-% IV SOLN
INTRAVENOUS | Status: AC
  Filled 2014-09-26: qty 50

## 2014-09-26 MED ORDER — SODIUM CHLORIDE 0.9 % IV SOLN
10.0000 mg/kg | Freq: Once | INTRAVENOUS | Status: AC
  Administered 2014-09-26: 535 mg via INTRAVENOUS
  Filled 2014-09-26: qty 21.4

## 2014-09-26 MED ORDER — ACETAMINOPHEN 325 MG PO TABS
ORAL_TABLET | ORAL | Status: AC
  Filled 2014-09-26: qty 2

## 2014-09-26 MED ORDER — DIPHENHYDRAMINE HCL 50 MG/ML IJ SOLN
INTRAMUSCULAR | Status: AC
  Filled 2014-09-26: qty 1

## 2014-09-26 MED ORDER — DIPHENHYDRAMINE HCL 25 MG PO CAPS
50.0000 mg | ORAL_CAPSULE | Freq: Once | ORAL | Status: AC
  Administered 2014-09-26: 50 mg via ORAL

## 2014-09-26 MED ORDER — DEXAMETHASONE SODIUM PHOSPHATE 10 MG/ML IJ SOLN
8.0000 mg | Freq: Once | INTRAMUSCULAR | Status: AC
  Administered 2014-09-26: 8 mg via INTRAVENOUS

## 2014-09-26 MED ORDER — SODIUM CHLORIDE 0.9 % IV SOLN
Freq: Once | INTRAVENOUS | Status: AC
  Administered 2014-09-26: 13:00:00 via INTRAVENOUS

## 2014-09-26 NOTE — Assessment & Plan Note (Addendum)
Clinically, she is tolerating treatment very well. Recent light chain study show she have responded very well to treatment.  I will see her back in 2 weeks for toxicity review.. In the meantime, she will proceed with treatment without dose adjustment. I plan to start reducing dexamethasone by mouth to 20 mg weekly. When I see her next, we will reduce the dose further.

## 2014-09-26 NOTE — Progress Notes (Signed)
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   01/03/2007 -  Radiation Therapy Approximate date only, received RT for plasmacytoma followed by surgery   09/14/2007 Initial Diagnosis MULTIPLE  MYELOMA   10/05/2007 Bone Marrow Biopsy Bm biopsy was negative   06/18/2008 Bone Marrow Biopsy BM biopsy was negative   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 [facility]   04/13/2011 Relapse/Recurrence Disease relapse   04/14/2011 Bone Marrow Biopsy Bm biopsy showed 2 % plasma cell   04/27/2011 Relapse/Recurrence Disease relapse, treated with Velcade/Cytoxan/Dex   09/07/2011 - 07/28/2013 Chemotherapy She has been receiving Velcade   12/27/2011 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   08/02/2014 -  Chemotherapy She enrolled in clinical trial using combination therapy with Revlimid,  dexamethasone and Elotuzumab    INTERVAL HISTORY: Please see below for problem oriented charting. She is seen prior to cycle 3 day 1 of therapy. The cough is improving. Recently she was started on a new blood pressure medication that causes her to cough violently and she discontinue that.  REVIEW OF SYSTEMS:   Constitutional: Denies fevers, chills or abnormal weight loss Eyes: Denies blurriness of vision Ears, nose, mouth, throat, and face: Denies mucositis or sore throat Respiratory: Denies cough, dyspnea or wheezes Cardiovascular: Denies palpitation, chest discomfort or lower extremity swelling Gastrointestinal:  Denies nausea, heartburn or change in bowel habits Skin: Denies abnormal skin rashes Lymphatics: Denies new lymphadenopathy or easy bruising Neurological:Denies numbness, tingling or new weaknesses Behavioral/Psych: Mood is stable, no new changes  All other systems were reviewed with the patient and are negative.  I have reviewed the past medical history, past surgical history, social history and family history with the patient and they are unchanged from previous note.  ALLERGIES:  is allergic to codeine; ibuprofen; and albuterol.  MEDICATIONS:  Current Outpatient Prescriptions  Medication Sig Dispense Refill  . Alum & Mag Hydroxide-Simeth (MAGIC MOUTHWASH) SOLN Take 10 mLs by mouth 4 (four) times daily as needed for mouth pain. Swish and spit  Or  Swish and swallow. 300 mL 2  . amoxicillin-clavulanate (AUGMENTIN) 875-125 MG per tablet Take 1 tablet by mouth 2 (two) times daily. 20 tablet 0  . aspirin 81 MG tablet Take 81 mg by mouth daily.    . Cholecalciferol (VITAMIN D-3) 5000 UNITS TABS Take 1 tablet by mouth every morning.    Marland Kitchen dexamethasone (DECADRON) 4 MG tablet Take as directed by chemotherapy protocol, 7 pills on  the day of chemo 90 tablet 3  . dextromethorphan-guaiFENesin (MUCINEX DM) 30-600 MG per 12 hr tablet Take 1 tablet by mouth 2 (two) times daily as  needed for cough (cough).    Marland Kitchen ipratropium (ATROVENT HFA) 17 MCG/ACT inhaler Inhale 2 puffs into the lungs every 4 (four) hours as needed for wheezing. 1 Inhaler 12  . lenalidomide (REVLIMID) 25 MG capsule TAKE ONE CAPSULE BY MOUTH EVERY DAY FOR 21 DAYS THEN 7 DAYS OFF. 21 capsule 0  . lidocaine-prilocaine (EMLA) cream Apply 1 application topically as needed. 30 g 0  . magnesium hydroxide (MILK OF MAGNESIA) 400 MG/5ML suspension Take 30 mLs by mouth daily as needed for mild constipation (constipation).     . morphine (MS CONTIN) 60 MG 12 hr tablet Take 60 mg by mouth every 12 (twelve) hours.    Marland Kitchen morphine (MSIR) 30 MG tablet Take 30 mg by mouth every 4 (four) hours as needed for severe pain (pain).     . predniSONE (DELTASONE) 20 MG tablet 3 tabs po day one, then 2 po daily x 4 days 11 tablet 0  . prochlorperazine (COMPAZINE) 10 MG tablet Take 1 tablet (10 mg total) by mouth every 6 (six) hours as needed for nausea. 60 tablet 3  . olmesartan (BENICAR) 40 MG tablet Take 1 tablet (40 mg total) by mouth daily. (Patient not taking: Reported on 09/26/2014) 14 tablet 0  . [DISCONTINUED] amLODipine (NORVASC) 5 MG tablet Take 1 tablet (5 mg total) by mouth daily. 90 tablet 3  . [DISCONTINUED] gabapentin (NEURONTIN) 600 MG tablet Take 600 mg by mouth 3 (three) times daily.       No current facility-administered medications for this visit.   Facility-Administered Medications Ordered in Other Visits  Medication Dose Route Frequency Provider Last Rate Last Dose  . sodium chloride 0.9 % injection 10 mL  10 mL Intravenous PRN Heath Lark, MD   10 mL at 02/26/14 1425  . sodium chloride 0.9 % injection 10 mL  10 mL Intravenous PRN Chauncey Cruel, MD   10 mL at 04/05/14 1333  . sodium chloride 0.9 % injection 10 mL  10 mL Intracatheter PRN Heath Lark, MD   10 mL at 09/26/14 1448    PHYSICAL EXAMINATION: ECOG PERFORMANCE STATUS: 1 - Symptomatic but completely ambulatory  Filed Vitals:   09/26/14 1203  BP:  143/87  Pulse: 80  Temp: 98 F (36.7 C)  Resp: 98   Filed Weights   09/26/14 1203  Weight: 118 lb (53.524 kg)    GENERAL:alert, no distress and comfortable SKIN: skin color, texture, turgor are normal, no rashes or significant lesions EYES: normal, Conjunctiva are pink and non-injected, sclera clear OROPHARYNX:no exudate, no erythema and lips, buccal mucosa, and tongue normal . Poor dentition is noted NECK: supple, thyroid normal size, non-tender, without nodularity LYMPH:  no palpable lymphadenopathy in the cervical, axillary or inguinal LUNGS: clear to auscultation and percussion with normal breathing effort HEART: regular rate & rhythm and no murmurs and no lower extremity edema ABDOMEN:abdomen soft, non-tender and normal bowel sounds Musculoskeletal:no cyanosis of digits and no clubbing  NEURO: alert & oriented x 3 with fluent speech, no focal motor/sensory deficits  LABORATORY DATA:  I have reviewed the data as listed    Component Value Date/Time   NA 141 09/26/2014 1146   NA 141 09/14/2014 1436   K 3.6 09/26/2014 1146   K 3.6* 09/14/2014 1436   CL 105 09/14/2014 1436   CL 107  03/10/2013 1014   CO2 24 09/26/2014 1146   CO2 19 09/14/2014 1436   GLUCOSE 115 09/26/2014 1146   GLUCOSE 187* 09/14/2014 1436   GLUCOSE 111* 03/10/2013 1014   GLUCOSE 98 08/31/2006 1024   BUN 18.3 09/26/2014 1146   BUN 15 09/14/2014 1436   CREATININE 0.8 09/26/2014 1146   CREATININE 0.60 09/14/2014 1436   CALCIUM 9.7 09/26/2014 1146   CALCIUM 9.0 09/14/2014 1436   PROT 6.8 09/26/2014 1146   PROT 6.6 09/11/2014 0336   ALBUMIN 3.8 09/26/2014 1146   ALBUMIN 3.4* 09/11/2014 0336   AST 16 09/26/2014 1146   AST 18 09/11/2014 0336   ALT 17 09/26/2014 1146   ALT 18 09/11/2014 0336   ALKPHOS 87 09/26/2014 1146   ALKPHOS 113 09/11/2014 0336   BILITOT 0.58 09/26/2014 1146   BILITOT 0.6 09/11/2014 0336   GFRNONAA >90 09/14/2014 1436   GFRAA >90 09/14/2014 1436    No results found for:  SPEP, UPEP  Lab Results  Component Value Date   WBC 6.4 09/26/2014   NEUTROABS 5.5 09/26/2014   HGB 15.2 09/26/2014   HCT 46.3 09/26/2014   MCV 94.1 09/26/2014   PLT 221 09/26/2014      Chemistry      Component Value Date/Time   NA 141 09/26/2014 1146   NA 141 09/14/2014 1436   K 3.6 09/26/2014 1146   K 3.6* 09/14/2014 1436   CL 105 09/14/2014 1436   CL 107 03/10/2013 1014   CO2 24 09/26/2014 1146   CO2 19 09/14/2014 1436   BUN 18.3 09/26/2014 1146   BUN 15 09/14/2014 1436   CREATININE 0.8 09/26/2014 1146   CREATININE 0.60 09/14/2014 1436      Component Value Date/Time   CALCIUM 9.7 09/26/2014 1146   CALCIUM 9.0 09/14/2014 1436   ALKPHOS 87 09/26/2014 1146   ALKPHOS 113 09/11/2014 0336   AST 16 09/26/2014 1146   AST 18 09/11/2014 0336   ALT 17 09/26/2014 1146   ALT 18 09/11/2014 0336   BILITOT 0.58 09/26/2014 1146   BILITOT 0.6 09/11/2014 0336       ASSESSMENT & PLAN:  Multiple myeloma Clinically, she is tolerating treatment very well. Recent light chain study show she have responded very well to treatment.  I will see her back in 2 weeks for toxicity review.. In the meantime, she will proceed with treatment without dose adjustment. I plan to start reducing dexamethasone by mouth to 20 mg weekly. When I see her next, we will reduce the dose further.   Cough She has extensive pulmonary evaluation and it is thought to be likely due to COPD/smoking. This is improving recently.  Smoker I spent some time counseling the patient the importance of tobacco cessation. She is currently not interested to quit now.   No orders of the defined types were placed in this encounter.   All questions were answered. The patient knows to call the clinic with any problems, questions or concerns. No barriers to learning was detected. I spent 25 minutes counseling the patient face to face. The total time spent in the appointment was 30 minutes and more than 50% was on counseling  and review of test results     [facility], [place], MD 09/26/2014 3:55 PM

## 2014-09-26 NOTE — Progress Notes (Signed)
09/26/14 at 1:56pm BMS [id number], Cycle 3, Day 1: Kendra Johns into the [facility] for the start of cycle 3. She confirmed that she took her dexamethasone 28 mg at 9:00 am with food. She was seen and examined today by Dr. Alvy Bimler. She denies any new adverse events. Dr. Alvy Bimler decided to reduce her dexamethasone to 20 mg weekly (5 tabs).  Dr. Alvy Bimler stated that she feels that 40 mg of dexamethasone on day 8 and day 22 is too much for a pt this size.  She feels that the pt has had a great response to her treatment.  She feels that decreasing the dexamethasone to 20 mg on days 8, 15 and 22 is in the pt's best interest.  The pt will continue to receive 8 mg of dexamethasone IV prior to her elotuzumab infusions.  The pt was instructed to begin her Revlimid dosing today on day 1. The research nurse spoke with Kendra Johns, PharmD who adjusted the dose of elotuzumab to today's weight. Obtained kit assignment through the [facility] system and gave that to the pharmacy staff. Spoke with Kendra Glassing, RN in the infusion about about Kendra Johns's treatment and provided her with a sign for the pump. Emphasized the rate of infusion, the timing of the premeds, and the use of the 0.22 micron filter. The research nurse also provided the pt with a calendar to help the pt with her treatment for cycle 3.  The pt had no questions or concerns about her treatment.  The pt was thanked for her continued support of this trial.

## 2014-09-26 NOTE — Assessment & Plan Note (Addendum)
She has extensive pulmonary evaluation and it is thought to be likely due to COPD/smoking. This is improving recently.

## 2014-09-26 NOTE — Patient Instructions (Signed)
[facility] Discharge Instructions for Patients Receiving Chemotherapy  Today you received the following chemotherapy agents ElotuzumaB To help prevent nausea and vomiting after your treatment, we encourage you to take your nausea medication AS PRESCRIBED,   If you develop nausea and vomiting that is not controlled by your nausea medication, call the clinic.   BELOW ARE SYMPTOMS THAT SHOULD BE REPORTED IMMEDIATELY:  *FEVER GREATER THAN 100.5 F  *CHILLS WITH OR WITHOUT FEVER  NAUSEA AND VOMITING THAT IS NOT CONTROLLED WITH YOUR NAUSEA MEDICATION  *UNUSUAL SHORTNESS OF BREATH  *UNUSUAL BRUISING OR BLEEDING  TENDERNESS IN MOUTH AND THROAT WITH OR WITHOUT PRESENCE OF ULCERS  *URINARY PROBLEMS  *BOWEL PROBLEMS  UNUSUAL RASH Items with * indicate a potential emergency and should be followed up as soon as possible.  Feel free to call the clinic you have any questions or concerns. The clinic phone number is [phone].

## 2014-09-26 NOTE — Telephone Encounter (Signed)
gv pt appt schedule for dec.  °

## 2014-09-26 NOTE — Assessment & Plan Note (Signed)
I spent some time counseling the patient the importance of tobacco cessation. She is currently not interested to quit now. 

## 2014-10-03 ENCOUNTER — Telehealth: Payer: Self-pay | Admitting: *Deleted

## 2014-10-03 NOTE — Telephone Encounter (Signed)
10/03/14 at 11:21am - The research nurse called the pt to verify if she had taken her decadron dose this morning.  The pt stated that she had not taken her decadron dose yet.  The pt verbalized that she would take 5 tabs of decadron (decadron 4 mg) with food this morning.  The pt was reminded to use her new, December 2015 medication calendar for reference.  The pt retrieved her medication dosing calendar provided by the research nurse on 09/26/14.  The research nurse and the pt went over that Dr. Alvy Bimler decided last week to reduce her weekly dexamethasone dose to 20 mg (5 tabs).  The pt stated that she had plenty of dexamethasone pills at home to take.  She was instructed to take her 20 mg dose of decadron today and then again on 10/10/14 at 9am before her scheduled treatment next week at 12:30pm.  The pt confirmed that she is continuing to take her Revlimid dose daily.  The pt is aware of her next week appointments.  The pt was reminded that she is now in cycle 3 and the elotuzumab dosing is every 2 weeks, not weekly.  The pt verbalized understanding.  The pt was also informed that elotuzumab was FDA approved on 10/01/14.  The pt was informed that she will be transitioned to commercial drug within the next 60 days.  The pt was told that Dr. Alvy Bimler will discuss this approval with her at her next office visit.  The pt had no questions or concerns regarding her treatment.

## 2014-10-05 ENCOUNTER — Ambulatory Visit: Payer: Self-pay | Admitting: Adult Health

## 2014-10-05 ENCOUNTER — Encounter: Payer: Self-pay | Admitting: Adult Health

## 2014-10-10 ENCOUNTER — Telehealth: Payer: Self-pay | Admitting: *Deleted

## 2014-10-10 ENCOUNTER — Ambulatory Visit (HOSPITAL_BASED_OUTPATIENT_CLINIC_OR_DEPARTMENT_OTHER): Payer: Medicare Other

## 2014-10-10 ENCOUNTER — Encounter: Payer: Self-pay | Admitting: Hematology and Oncology

## 2014-10-10 ENCOUNTER — Ambulatory Visit (HOSPITAL_BASED_OUTPATIENT_CLINIC_OR_DEPARTMENT_OTHER): Payer: Medicare Other | Admitting: Hematology and Oncology

## 2014-10-10 ENCOUNTER — Encounter: Payer: Self-pay | Admitting: *Deleted

## 2014-10-10 ENCOUNTER — Other Ambulatory Visit (HOSPITAL_BASED_OUTPATIENT_CLINIC_OR_DEPARTMENT_OTHER): Payer: Medicare Other

## 2014-10-10 ENCOUNTER — Telehealth: Payer: Self-pay | Admitting: Hematology and Oncology

## 2014-10-10 VITALS — BP 118/81 | HR 77 | Temp 98.1°F | Resp 18 | Ht 64.0 in | Wt 119.8 lb

## 2014-10-10 DIAGNOSIS — D696 Thrombocytopenia, unspecified: Secondary | ICD-10-CM | POA: Insufficient documentation

## 2014-10-10 DIAGNOSIS — C9000 Multiple myeloma not having achieved remission: Secondary | ICD-10-CM

## 2014-10-10 DIAGNOSIS — Z5112 Encounter for antineoplastic immunotherapy: Secondary | ICD-10-CM

## 2014-10-10 DIAGNOSIS — D6959 Other secondary thrombocytopenia: Secondary | ICD-10-CM

## 2014-10-10 DIAGNOSIS — Z006 Encounter for examination for normal comparison and control in clinical research program: Secondary | ICD-10-CM

## 2014-10-10 DIAGNOSIS — T50905A Adverse effect of unspecified drugs, medicaments and biological substances, initial encounter: Secondary | ICD-10-CM

## 2014-10-10 LAB — COMPREHENSIVE METABOLIC PANEL (CC13)
ALBUMIN: 3.7 g/dL (ref 3.5–5.0)
ALT: 22 U/L (ref 0–55)
AST: 20 U/L (ref 5–34)
Alkaline Phosphatase: 86 U/L (ref 40–150)
Anion Gap: 9 mEq/L (ref 3–11)
BUN: 9.4 mg/dL (ref 7.0–26.0)
CO2: 21 mEq/L — ABNORMAL LOW (ref 22–29)
Calcium: 9.3 mg/dL (ref 8.4–10.4)
Chloride: 109 mEq/L (ref 98–109)
Creatinine: 0.8 mg/dL (ref 0.6–1.1)
EGFR: >90 mL/min/{1.73_m2} (ref >90)
Glucose: 135 mg/dl (ref 70–140)
POTASSIUM: 3.8 meq/L (ref 3.5–5.1)
Sodium: 140 mEq/L (ref 136–145)
Total Bilirubin: 0.63 mg/dL (ref 0.20–1.20)
Total Protein: 6.6 g/dL (ref 6.4–8.3)

## 2014-10-10 LAB — CBC WITH DIFFERENTIAL/PLATELET
BASO%: 1.4 % (ref 0.0–2.0)
BASOS ABS: 0.1 10*3/uL (ref 0.0–0.1)
EOS ABS: 0.6 10*3/uL — AB (ref 0.0–0.5)
EOS%: 10.3 % — ABNORMAL HIGH (ref 0.0–7.0)
HEMATOCRIT: 50.2 % — AB (ref 34.8–46.6)
HEMOGLOBIN: 16.4 g/dL — AB (ref 11.6–15.9)
LYMPH%: 9.8 % — AB (ref 14.0–49.7)
MCH: 30.6 pg (ref 25.1–34.0)
MCHC: 32.6 g/dL (ref 31.5–36.0)
MCV: 93.9 fL (ref 79.5–101.0)
MONO#: 0.4 10*3/uL (ref 0.1–0.9)
MONO%: 7.5 % (ref 0.0–14.0)
NEUT%: 71 % (ref 38.4–76.8)
NEUTROS ABS: 3.8 10*3/uL (ref 1.5–6.5)
Platelets: 138 10*3/uL — ABNORMAL LOW (ref 145–400)
RBC: 5.35 10*6/uL (ref 3.70–5.45)
RDW: 17.3 % — ABNORMAL HIGH (ref 11.2–14.5)
WBC: 5.4 10*3/uL (ref 3.9–10.3)
lymph#: 0.5 10*3/uL — ABNORMAL LOW (ref 0.9–3.3)

## 2014-10-10 MED ORDER — DIPHENHYDRAMINE HCL 25 MG PO CAPS
50.0000 mg | ORAL_CAPSULE | Freq: Once | ORAL | Status: AC
  Administered 2014-10-10: 50 mg via ORAL

## 2014-10-10 MED ORDER — HEPARIN SOD (PORK) LOCK FLUSH 100 UNIT/ML IV SOLN
500.0000 [IU] | Freq: Once | INTRAVENOUS | Status: AC | PRN
  Administered 2014-10-10: 500 [IU]
  Filled 2014-10-10: qty 5

## 2014-10-10 MED ORDER — SODIUM CHLORIDE 0.9 % IJ SOLN
10.0000 mL | INTRAMUSCULAR | Status: DC | PRN
  Administered 2014-10-10: 10 mL
  Filled 2014-10-10: qty 10

## 2014-10-10 MED ORDER — DIPHENHYDRAMINE HCL 25 MG PO CAPS
ORAL_CAPSULE | ORAL | Status: AC
  Filled 2014-10-10: qty 2

## 2014-10-10 MED ORDER — SODIUM CHLORIDE 0.9 % IV SOLN
Freq: Once | INTRAVENOUS | Status: AC
  Administered 2014-10-10: 12:00:00 via INTRAVENOUS

## 2014-10-10 MED ORDER — DEXAMETHASONE SODIUM PHOSPHATE 10 MG/ML IJ SOLN
8.0000 mg | Freq: Once | INTRAMUSCULAR | Status: AC
  Administered 2014-10-10: 8 mg via INTRAVENOUS

## 2014-10-10 MED ORDER — ACETAMINOPHEN 325 MG PO TABS
ORAL_TABLET | ORAL | Status: AC
  Filled 2014-10-10: qty 2

## 2014-10-10 MED ORDER — INV-ELOTUZUMAB CHEMO INJECTION 400MG BMS CA204-143
10.0000 mg/kg | Freq: Once | INTRAMUSCULAR | Status: AC
  Administered 2014-10-10: 535 mg via INTRAVENOUS
  Filled 2014-10-10: qty 21.4

## 2014-10-10 MED ORDER — FAMOTIDINE IN NACL 20-0.9 MG/50ML-% IV SOLN
INTRAVENOUS | Status: AC
  Filled 2014-10-10: qty 50

## 2014-10-10 MED ORDER — DEXAMETHASONE SODIUM PHOSPHATE 10 MG/ML IJ SOLN
INTRAMUSCULAR | Status: AC
  Filled 2014-10-10: qty 1

## 2014-10-10 MED ORDER — ACETAMINOPHEN 325 MG PO TABS
650.0000 mg | ORAL_TABLET | Freq: Once | ORAL | Status: AC
  Administered 2014-10-10: 650 mg via ORAL

## 2014-10-10 MED ORDER — FAMOTIDINE IN NACL 20-0.9 MG/50ML-% IV SOLN
20.0000 mg | Freq: Once | INTRAVENOUS | Status: AC
  Administered 2014-10-10: 20 mg via INTRAVENOUS

## 2014-10-10 NOTE — Telephone Encounter (Signed)
Per staff message and POF I have scheduled appts. Advised scheduler of appts. JMW  

## 2014-10-10 NOTE — Progress Notes (Signed)
[facility] OFFICE PROGRESS NOTE  Patient Care Team: Shelva Majestic, MD as PCP - General (Family Medicine) Eddie Candle, MD as Consulting Physician (Medical Oncology) Shelva Majestic, MD as Consulting Physician (Family Medicine) Artis Delay, MD as Consulting Physician (Hematology and Oncology)  SUMMARY OF ONCOLOGIC HISTORY: Oncology History   Multiple myeloma, kappa light chain disease, Durie-Salmon stage III     Multiple myeloma   07/16/2006 Bone Marrow Biopsy BM biopsy is non-diagnostic   09/21/2006 Procedure L5 vertebral biopsy 5% plasma cell   10/25/2006 Bone Marrow Biopsy BM biopsy is hypercellular with 5% plasma cell   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   01/03/2007 -  Radiation Therapy Approximate date only, received RT for plasmacytoma followed by surgery   09/14/2007 Initial Diagnosis MULTIPLE  MYELOMA   10/05/2007 Bone Marrow Biopsy Bm biopsy was negative   06/18/2008 Bone Marrow Biopsy BM biopsy was negative   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 [facility]   04/13/2011 Relapse/Recurrence Disease relapse   04/14/2011 Bone Marrow Biopsy Bm biopsy showed 2 % plasma cell   04/27/2011 Relapse/Recurrence Disease relapse, treated with Velcade/Cytoxan/Dex   09/07/2011 - 07/28/2013 Chemotherapy She has been receiving Velcade   12/27/2011 Bone Marrow Transplant 2nd transplant at [facility]   07/28/2013 Relapse/Recurrence Chemo is stopped due to progression of disease   08/29/2013 Imaging PEt/CT showed recurrence of disease with new lesion on her rib with compression fracture   09/13/2013 Bone Marrow Biopsy BM biopsy is hypercellular with 6% plasma cell   10/10/2013 - 10/20/2013 Radiation Therapy Started on palliative XRT for rib pain   11/27/2013 - 04/06/2014 Chemotherapy The patient starts chemotherapy with Carfilzomib   07/19/2014 Imaging Repeat bloodwork and PET/CT scan shows significant disease progression.   08/02/2014 -  Chemotherapy She  enrolled in clinical trial using combination therapy with Revlimid, dexamethasone and Elotuzumab    INTERVAL HISTORY: Please see below for problem oriented charting.  she is seen today prior to chemotherapy. She is doing well. Her cough has resolved. Her bone pain is stable.  REVIEW OF SYSTEMS:   Constitutional: Denies fevers, chills or abnormal weight loss Eyes: Denies blurriness of vision Ears, nose, mouth, throat, and face: Denies mucositis or sore throat Respiratory: Denies cough, dyspnea or wheezes Cardiovascular: Denies palpitation, chest discomfort or lower extremity swelling Gastrointestinal:  Denies nausea, heartburn or change in bowel habits Skin: Denies abnormal skin rashes Lymphatics: Denies new lymphadenopathy or easy bruising Neurological:Denies numbness, tingling or new weaknesses Behavioral/Psych: Mood is stable, no new changes  All other systems were reviewed with the patient and are negative.  I have reviewed the past medical history, past surgical history, social history and family history with the patient and they are unchanged from previous note.  ALLERGIES:  is allergic to codeine; ibuprofen; and albuterol.  MEDICATIONS:  Current Outpatient Prescriptions  Medication Sig Dispense Refill  . Alum & Mag Hydroxide-Simeth (MAGIC MOUTHWASH) SOLN Take 10 mLs by mouth 4 (four) times daily as needed for mouth pain. Swish and spit  Or  Swish and swallow. 300 mL 2  . aspirin 81 MG tablet Take 81 mg by mouth daily.    . Cholecalciferol (VITAMIN D-3) 5000 UNITS TABS Take 1 tablet by mouth every morning.    Marland Kitchen dexamethasone (DECADRON) 4 MG tablet Take as directed by chemotherapy protocol, 7 pills on the day of chemo 90 tablet 3  . dextromethorphan-guaiFENesin (MUCINEX DM) 30-600 MG per 12 hr tablet Take 1 tablet by mouth 2 (two)  times daily as needed for cough (cough).    Marland Kitchen ipratropium (ATROVENT HFA) 17 MCG/ACT inhaler Inhale 2 puffs into the lungs every 4 (four) hours as  needed for wheezing. 1 Inhaler 12  . lenalidomide (REVLIMID) 25 MG capsule TAKE ONE CAPSULE BY MOUTH EVERY DAY FOR 21 DAYS THEN 7 DAYS OFF. 21 capsule 0  . lidocaine-prilocaine (EMLA) cream Apply 1 application topically as needed. 30 g 0  . magnesium hydroxide (MILK OF MAGNESIA) 400 MG/5ML suspension Take 30 mLs by mouth daily as needed for mild constipation (constipation).     . morphine (MS CONTIN) 60 MG 12 hr tablet Take 60 mg by mouth every 12 (twelve) hours.    Marland Kitchen morphine (MSIR) 30 MG tablet Take 30 mg by mouth every 4 (four) hours as needed for severe pain (pain).     . prochlorperazine (COMPAZINE) 10 MG tablet Take 1 tablet (10 mg total) by mouth every 6 (six) hours as needed for nausea. 60 tablet 3  . [DISCONTINUED] amLODipine (NORVASC) 5 MG tablet Take 1 tablet (5 mg total) by mouth daily. 90 tablet 3  . [DISCONTINUED] gabapentin (NEURONTIN) 600 MG tablet Take 600 mg by mouth 3 (three) times daily.       No current facility-administered medications for this visit.   Facility-Administered Medications Ordered in Other Visits  Medication Dose Route Frequency Provider Last Rate Last Dose  . sodium chloride 0.9 % injection 10 mL  10 mL Intravenous PRN Heath Lark, MD   10 mL at 02/26/14 1425  . sodium chloride 0.9 % injection 10 mL  10 mL Intravenous PRN Chauncey Cruel, MD   10 mL at 04/05/14 1333    PHYSICAL EXAMINATION: ECOG PERFORMANCE STATUS: 0 - Asymptomatic  Filed Vitals:   10/10/14 1107  BP: 118/81  Pulse: 77  Temp: 98.1 F (36.7 C)  Resp: 18   Filed Weights   10/10/14 1107  Weight: 119 lb 12.8 oz (54.341 kg)    GENERAL:alert, no distress and comfortable SKIN: skin color, texture, turgor are normal, no rashes or significant lesions EYES: normal, Conjunctiva are pink and non-injected, sclera clear OROPHARYNX:no exudate, no erythema and lips, buccal mucosa, and tongue normal . Poor dentition is noted NECK: supple, thyroid normal size, non-tender, without  nodularity LYMPH:  no palpable lymphadenopathy in the cervical, axillary or inguinal LUNGS: clear to auscultation and percussion with normal breathing effort HEART: regular rate & rhythm and no murmurs and no lower extremity edema ABDOMEN:abdomen soft, non-tender and normal bowel sounds Musculoskeletal:no cyanosis of digits and no clubbing  NEURO: alert & oriented x 3 with fluent speech, no focal motor/sensory deficits  LABORATORY DATA:  I have reviewed the data as listed    Component Value Date/Time   NA 140 10/10/2014 1039   NA 141 09/14/2014 1436   K 3.8 10/10/2014 1039   K 3.6* 09/14/2014 1436   CL 105 09/14/2014 1436   CL 107 03/10/2013 1014   CO2 21* 10/10/2014 1039   CO2 19 09/14/2014 1436   GLUCOSE 135 10/10/2014 1039   GLUCOSE 187* 09/14/2014 1436   GLUCOSE 111* 03/10/2013 1014   GLUCOSE 98 08/31/2006 1024   BUN 9.4 10/10/2014 1039   BUN 15 09/14/2014 1436   CREATININE 0.8 10/10/2014 1039   CREATININE 0.60 09/14/2014 1436   CALCIUM 9.3 10/10/2014 1039   CALCIUM 9.0 09/14/2014 1436   PROT 6.6 10/10/2014 1039   PROT 6.6 09/11/2014 0336   ALBUMIN 3.7 10/10/2014 1039   ALBUMIN 3.4* 09/11/2014  0336   AST 20 10/10/2014 1039   AST 18 09/11/2014 0336   ALT 22 10/10/2014 1039   ALT 18 09/11/2014 0336   ALKPHOS 86 10/10/2014 1039   ALKPHOS 113 09/11/2014 0336   BILITOT 0.63 10/10/2014 1039   BILITOT 0.6 09/11/2014 0336   GFRNONAA >90 09/14/2014 1436   GFRAA >90 09/14/2014 1436    No results found for: SPEP, UPEP  Lab Results  Component Value Date   WBC 5.4 10/10/2014   NEUTROABS 3.8 10/10/2014   HGB 16.4* 10/10/2014   HCT 50.2* 10/10/2014   MCV 93.9 10/10/2014   PLT 138* 10/10/2014      Chemistry      Component Value Date/Time   NA 140 10/10/2014 1039   NA 141 09/14/2014 1436   K 3.8 10/10/2014 1039   K 3.6* 09/14/2014 1436   CL 105 09/14/2014 1436   CL 107 03/10/2013 1014   CO2 21* 10/10/2014 1039   CO2 19 09/14/2014 1436   BUN 9.4 10/10/2014 1039    BUN 15 09/14/2014 1436   CREATININE 0.8 10/10/2014 1039   CREATININE 0.60 09/14/2014 1436      Component Value Date/Time   CALCIUM 9.3 10/10/2014 1039   CALCIUM 9.0 09/14/2014 1436   ALKPHOS 86 10/10/2014 1039   ALKPHOS 113 09/11/2014 0336   AST 20 10/10/2014 1039   AST 18 09/11/2014 0336   ALT 22 10/10/2014 1039   ALT 18 09/11/2014 0336   BILITOT 0.63 10/10/2014 1039   BILITOT 0.6 09/11/2014 0336      ASSESSMENT & PLAN:  Multiple myeloma Clinically, she is tolerating treatment very well. Recent light chain study show she have responded very well to treatment.  I will see her back in 4 weeks for toxicity review.. In the meantime, she will proceed with treatment without dose adjustment. I plan to start reducing dexamethasone by mouth to 16 mg weekly. When I see her next, we will reduce the dose further.     Thrombocytopenia due to drugs This is likely due to recent treatment. The patient denies recent history of bleeding such as epistaxis, hematuria or hematochezia. She is asymptomatic from the low platelet count. I will observe for now.  she does not require transfusion now. I will continue the chemotherapy at current dose without dosage adjustment.  If the thrombocytopenia gets progressive worse in the future, I might have to delay her treatment or adjust the chemotherapy dose.     Orders Placed This Encounter  Procedures  . SPEP & IFE with QIG    Standing Status: Future     Number of Occurrences:      Standing Expiration Date: 11/14/2015  . Kappa/lambda light chains    Standing Status: Future     Number of Occurrences:      Standing Expiration Date: 11/14/2015  . Beta 2 microglobulin, serum    Standing Status: Future     Number of Occurrences:      Standing Expiration Date: 11/14/2015   All questions were answered. The patient knows to call the clinic with any problems, questions or concerns. No barriers to learning was detected. I spent 25 minutes counseling the  patient face to face. The total time spent in the appointment was 30 minutes and more than 50% was on counseling and review of test results     [facility], [place], MD 10/10/2014 10:00 PM

## 2014-10-10 NOTE — Assessment & Plan Note (Signed)
This is likely due to recent treatment. The patient denies recent history of bleeding such as epistaxis, hematuria or hematochezia. She is asymptomatic from the low platelet count. I will observe for now.  she does not require transfusion now. I will continue the chemotherapy at current dose without dosage adjustment.  If the thrombocytopenia gets progressive worse in the future, I might have to delay her treatment or adjust the chemotherapy dose.   

## 2014-10-10 NOTE — Assessment & Plan Note (Signed)
Clinically, she is tolerating treatment very well. Recent light chain study show she have responded very well to treatment.  I will see her back in 4 weeks for toxicity review.. In the meantime, she will proceed with treatment without dose adjustment. I plan to start reducing dexamethasone by mouth to 16 mg weekly. When I see her next, we will reduce the dose further.

## 2014-10-10 NOTE — Progress Notes (Signed)
10/10/14 at 1:13pm BMS [id number], Cycle 3, Day 15: Kendra Johns was into the [facility] with her family for her cycle 3, day 15 assessments and treatment. She confirmed that she took her dexamethasone 20 mg (5 tabs) at 9:00 am with food this morning. She was seen and examined today by Dr. Alvy Bimler. Dr. Alvy Bimler explained to the pt that elotuzumab is now FDA approved for relapsed or refractory multiple myeloma.   The doctor thanked the pt for willingness to participate in this trial.  The pt denies any new adverse events. The pt said that she is tolerating this treatment with no side effects.   Dr. Alvy Bimler decided to reduce her dexamethasone to 16 mg weekly (4 tabs) starting next week on cycle 3, day 22 ( 10/17/14). Dr. Alvy Bimler stated that she wants to eventually get the pt completely off the dexamethasone. Dr. Alvy Bimler will draw labs to check her response in 2 weeks on 10/24/14. The pt will continue to receive 8 mg of dexamethasone IV prior to her elotuzumab infusions. The pt continues to take her Revlimid daily.  Dr. Alvy Bimler reviewed the pt's labs, and she felt the the labs were acceptable to proceed with the pt's treatment today.  The pt will have a repeat cbc/diff in 2 weeks to monitor her platelet count.  Obtained kit assignment through the [employer] system and gave that to the pharmacy staff. Spoke with Ubaldo Glassing, RN in the infusion about about Kendra Johns's treatment and provided her with a sign for the pump. Emphasized the rate of infusion, the timing of the premeds, and the use of the 0.22 micron filter. The research nurse took the pt to [facility], Development worker, community, to discuss transitioning the pt to commercial elotuzumab.    The pt will complete the BMS assistance form for commercial elotuzumab. The pt had no questions or concerns about her treatment. The pt was thanked for her continued support of this trial.

## 2014-10-10 NOTE — Patient Instructions (Signed)
Cancer Center Discharge Instructions for Patients Receiving Chemotherapy  Today you received the following chemotherapy agents: Elotuzumab  To help prevent nausea and vomiting after your treatment, we encourage you to take your nausea medication as prescribed by your physician.    If you develop nausea and vomiting that is not controlled by your nausea medication, call the clinic.   BELOW ARE SYMPTOMS THAT SHOULD BE REPORTED IMMEDIATELY:  *FEVER GREATER THAN 100.5 F  *CHILLS WITH OR WITHOUT FEVER  NAUSEA AND VOMITING THAT IS NOT CONTROLLED WITH YOUR NAUSEA MEDICATION  *UNUSUAL SHORTNESS OF BREATH  *UNUSUAL BRUISING OR BLEEDING  TENDERNESS IN MOUTH AND THROAT WITH OR WITHOUT PRESENCE OF ULCERS  *URINARY PROBLEMS  *BOWEL PROBLEMS  UNUSUAL RASH Items with * indicate a potential emergency and should be followed up as soon as possible.  Feel free to call the clinic you have any questions or concerns. The clinic phone number is [phone].    

## 2014-10-10 NOTE — Telephone Encounter (Signed)
Gave avs & cal for Dec/Jan. Sent mess to sch tx °

## 2014-10-12 ENCOUNTER — Ambulatory Visit (INDEPENDENT_AMBULATORY_CARE_PROVIDER_SITE_OTHER): Payer: Medicare Other | Admitting: Family Medicine

## 2014-10-12 ENCOUNTER — Encounter: Payer: Self-pay | Admitting: Family Medicine

## 2014-10-12 VITALS — BP 118/74 | HR 70 | Temp 97.7°F | Wt 122.0 lb

## 2014-10-12 DIAGNOSIS — R059 Cough, unspecified: Secondary | ICD-10-CM

## 2014-10-12 DIAGNOSIS — R05 Cough: Secondary | ICD-10-CM

## 2014-10-12 DIAGNOSIS — I10 Essential (primary) hypertension: Secondary | ICD-10-CM

## 2014-10-12 MED ORDER — LOSARTAN POTASSIUM 50 MG PO TABS: 25.0000 mg | ORAL_TABLET | Freq: Every day | ORAL | Status: DC

## 2014-10-12 NOTE — Patient Instructions (Addendum)
Take 25 mg (1/2 tab) of losartan for 3-7 days. If you tolerate the medication, you may increase to full 50mg  and then see me a few days after that for blood pressure recheck.   There are other classes of medication we can try if this does not work for you

## 2014-10-12 NOTE — Progress Notes (Signed)
[name], MD Phone: [phone]  Subjective:   Kendra Johns is a 63 y.o. year old very pleasant female patient who presents with the following:  Hypertension-good control but with side effects  BP Readings from Last 3 Encounters:  10/12/14 118/74  10/10/14 118/81  09/26/14 143/87  Patient was changed to Benicar through samples by pulmonology due to her chronic cough but states it caused her abdominal pain. She stopped this and returned to lisinopril $RemoveBefor'20mg'JpluPRWkmcHM$  which she is taking today.  Home BP monitoring-no Compliant with medications-yes but side effects ROS-Denies any CP, HA, SOB, blurry vision.   Past Medical History- Patient Active Problem List   Diagnosis Date Noted  . Multiple myeloma 09/14/2007    Priority: High  . Essential hypertension 03/11/2010    Priority: Medium  . DIVERTICULOSIS, COLON 08/31/2006    Priority: Low  . Thrombocytopenia due to drugs 10/10/2014  . Gastritis 08/30/2014  . Mucositis due to chemotherapy 08/16/2014  . Cough 08/02/2014  . Abnormal LFTs 04/23/2014  . 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 SOB (shortness of breath) 02/13/2014  . Nausea alone 01/22/2014  . Fever 11/28/2013  . Dehydration 11/28/2013  . Unspecified vitamin D deficiency 09/22/2013  . Weight loss, abnormal 01/12/2012  . Sciatic nerve pain   . Bilateral low back pain without sciatica 03/17/2011  . Hyperlipidemia 02/13/2011  . BONE MARROW TRANSPLANTATION, HX OF 08/21/2009  . HEPATITIS C, CHRONIC VIRAL, W/O HEPATIC COMA 08/31/2006  . Smoker 08/31/2006  . DEPRESSION 08/31/2006  . History of adenomatous colon polyps 08/31/2006   Medications- reviewed and updated Current Outpatient Prescriptions  Medication Sig Dispense Refill  . aspirin 81 MG tablet Take 81 mg by mouth daily.    . Cholecalciferol (VITAMIN D-3) 5000 UNITS TABS Take 1 tablet by mouth every morning.    Marland Kitchen dexamethasone (DECADRON) 4 MG tablet Take as directed by chemotherapy protocol, 7 pills on the day  of chemo 90 tablet 3  . lenalidomide (REVLIMID) 25 MG capsule TAKE ONE CAPSULE BY MOUTH EVERY DAY FOR 21 DAYS THEN 7 DAYS OFF. 21 capsule 0  . morphine (MS CONTIN) 60 MG 12 hr tablet Take 60 mg by mouth every 12 (twelve) hours.    . Alum & Mag Hydroxide-Simeth (MAGIC MOUTHWASH) SOLN Take 10 mLs by mouth 4 (four) times daily as needed for mouth pain. Swish and spit  Or  Swish and swallow. (Patient not taking: Reported on 10/12/2014) 300 mL 2  . dextromethorphan-guaiFENesin (MUCINEX DM) 30-600 MG per 12 hr tablet Take 1 tablet by mouth 2 (two) times daily as needed for cough (cough).    Marland Kitchen ipratropium (ATROVENT HFA) 17 MCG/ACT inhaler Inhale 2 puffs into the lungs every 4 (four) hours as needed for wheezing. (Patient not taking: Reported on 10/12/2014) 1 Inhaler 12  . lidocaine-prilocaine (EMLA) cream Apply 1 application topically as needed. (Patient not taking: Reported on 10/12/2014) 30 g 0  . Lisinopril $RemoveBefo'20mg'xhWFSmCSfpL$  daily     . magnesium hydroxide (MILK OF MAGNESIA) 400 MG/5ML suspension Take 30 mLs by mouth daily as needed for mild constipation (constipation).     . morphine (MSIR) 30 MG tablet Take 30 mg by mouth every 4 (four) hours as needed for severe pain (pain).     . prochlorperazine (COMPAZINE) 10 MG tablet Take 1 tablet (10 mg total) by mouth every 6 (six) hours as needed for nausea. (Patient not taking: Reported on 10/12/2014) 60 tablet 3   Objective: BP 118/74 mmHg  Pulse 70  Temp(Src)  97.7 F (36.5 C)  Wt 122 lb (55.339 kg) Gen: NAD, resting comfortably CV: RRR no murmurs rubs or gallops Lungs: CTAB no crackles, wheeze, rhonchi Ext: no edema Skin: warm, dry, no rash Neuro: grossly normal, moves all extremities Psych: not anxious   Assessment/Plan:  Essential hypertension Recommended to patient that we trial another ARB and slowly titrate up to control blood pressure and avoid ace-i. Start with losartan $RemoveBefor'25mg'xnWyIEPSwisT$  and titrate to $RemoveBe'50mg'pSSQfXsil$ . Follow up per AVS. Trial amlodipine if side effects or  does not reach target BP.   Return precautions advised. Follow up about 2 weeks per AVS  Meds ordered this encounter  Medications  . losartan (COZAAR) 50 MG tablet    Sig: Take 0.5-1 tablets (25-50 mg total) by mouth daily.    Dispense:  30 tablet    Refill:  3

## 2014-10-14 ENCOUNTER — Encounter: Payer: Self-pay | Admitting: Family Medicine

## 2014-10-14 NOTE — Assessment & Plan Note (Signed)
Recommended to patient that we trial another ARB and slowly titrate up to control blood pressure and avoid ace-i. Start with losartan 25mg  and titrate to 50mg . Follow up per AVS. Trial amlodipine if side effects or does not reach target BP.

## 2014-10-16 ENCOUNTER — Other Ambulatory Visit: Payer: Self-pay | Admitting: *Deleted

## 2014-10-16 NOTE — Telephone Encounter (Signed)
THIS REFILL REQUEST FOR REVLIMID WAS PLACED ON DR.GORSUCH'S DESK. 

## 2014-10-17 ENCOUNTER — Telehealth: Payer: Self-pay | Admitting: *Deleted

## 2014-10-17 NOTE — Telephone Encounter (Signed)
10/17/14 at 9:10am - BMS [id number] - Study notes- The research nurse called the pt to see how she was feeling.  The pt said that she may have "over done it" lately.  She said that her sister was here visiting, and she "did too much".   The pt denied any new adverse events.  The pt was reminded that Dr. Alvy Bimler decreased her weekly dexamethasone dose to 16 mg (4 pills).  The pt verbalized understanding. The pt said that she would take her dexamethasone dose today with food.  She was also reminded of her cycle 4, day 1 visit scheduled for 10/24/14.  The pt was instructed to take her 16 mg of dexamethasone with food before her appt next week.  The pt had no questions about her treatment.

## 2014-10-18 ENCOUNTER — Other Ambulatory Visit: Payer: Self-pay | Admitting: *Deleted

## 2014-10-18 DIAGNOSIS — C9000 Multiple myeloma not having achieved remission: Secondary | ICD-10-CM

## 2014-10-18 MED ORDER — LENALIDOMIDE 25 MG PO CAPS: ORAL_CAPSULE | ORAL | Status: DC

## 2014-10-19 ENCOUNTER — Other Ambulatory Visit: Payer: Self-pay | Admitting: *Deleted

## 2014-10-24 ENCOUNTER — Ambulatory Visit (HOSPITAL_BASED_OUTPATIENT_CLINIC_OR_DEPARTMENT_OTHER): Payer: Medicare Other | Admitting: Lab

## 2014-10-24 ENCOUNTER — Ambulatory Visit (HOSPITAL_BASED_OUTPATIENT_CLINIC_OR_DEPARTMENT_OTHER): Payer: Medicare Other

## 2014-10-24 ENCOUNTER — Ambulatory Visit: Payer: Medicare Other

## 2014-10-24 ENCOUNTER — Encounter: Payer: Self-pay | Admitting: *Deleted

## 2014-10-24 VITALS — Wt 121.7 lb

## 2014-10-24 DIAGNOSIS — Z95828 Presence of other vascular implants and grafts: Secondary | ICD-10-CM

## 2014-10-24 DIAGNOSIS — C9000 Multiple myeloma not having achieved remission: Secondary | ICD-10-CM

## 2014-10-24 DIAGNOSIS — Z006 Encounter for examination for normal comparison and control in clinical research program: Secondary | ICD-10-CM

## 2014-10-24 DIAGNOSIS — Z5112 Encounter for antineoplastic immunotherapy: Secondary | ICD-10-CM

## 2014-10-24 LAB — CBC WITH DIFFERENTIAL/PLATELET
BASO%: 1.1 % (ref 0.0–2.0)
BASOS ABS: 0 10*3/uL (ref 0.0–0.1)
EOS%: 0.7 % (ref 0.0–7.0)
Eosinophils Absolute: 0 10*3/uL (ref 0.0–0.5)
HEMATOCRIT: 45.5 % (ref 34.8–46.6)
HEMOGLOBIN: 14.8 g/dL (ref 11.6–15.9)
LYMPH#: 0.4 10*3/uL — AB (ref 0.9–3.3)
LYMPH%: 8.9 % — AB (ref 14.0–49.7)
MCH: 30.5 pg (ref 25.1–34.0)
MCHC: 32.5 g/dL (ref 31.5–36.0)
MCV: 93.8 fL (ref 79.5–101.0)
MONO#: 0.1 10*3/uL (ref 0.1–0.9)
MONO%: 2.5 % (ref 0.0–14.0)
NEUT#: 3.6 10*3/uL (ref 1.5–6.5)
NEUT%: 86.8 % — ABNORMAL HIGH (ref 38.4–76.8)
PLATELETS: 184 10*3/uL (ref 145–400)
RBC: 4.85 10*6/uL (ref 3.70–5.45)
RDW: 17.3 % — ABNORMAL HIGH (ref 11.2–14.5)
WBC: 4.1 10*3/uL (ref 3.9–10.3)

## 2014-10-24 MED ORDER — DIPHENHYDRAMINE HCL 25 MG PO CAPS
ORAL_CAPSULE | ORAL | Status: AC
  Filled 2014-10-24: qty 2

## 2014-10-24 MED ORDER — DEXAMETHASONE SODIUM PHOSPHATE 10 MG/ML IJ SOLN
INTRAMUSCULAR | Status: AC
  Filled 2014-10-24: qty 1

## 2014-10-24 MED ORDER — SODIUM CHLORIDE 0.9 % IV SOLN
Freq: Once | INTRAVENOUS | Status: AC
  Administered 2014-10-24: 12:00:00 via INTRAVENOUS

## 2014-10-24 MED ORDER — HEPARIN SOD (PORK) LOCK FLUSH 100 UNIT/ML IV SOLN
500.0000 [IU] | Freq: Once | INTRAVENOUS | Status: AC | PRN
  Administered 2014-10-24: 500 [IU]
  Filled 2014-10-24: qty 5

## 2014-10-24 MED ORDER — SODIUM CHLORIDE 0.9 % IJ SOLN
10.0000 mL | INTRAMUSCULAR | Status: DC | PRN
  Administered 2014-10-24: 10 mL
  Filled 2014-10-24: qty 10

## 2014-10-24 MED ORDER — DIPHENHYDRAMINE HCL 25 MG PO CAPS
50.0000 mg | ORAL_CAPSULE | Freq: Once | ORAL | Status: AC
  Administered 2014-10-24: 50 mg via ORAL

## 2014-10-24 MED ORDER — ACETAMINOPHEN 325 MG PO TABS
650.0000 mg | ORAL_TABLET | Freq: Once | ORAL | Status: AC
  Administered 2014-10-24: 650 mg via ORAL

## 2014-10-24 MED ORDER — ACETAMINOPHEN 325 MG PO TABS
ORAL_TABLET | ORAL | Status: AC
  Filled 2014-10-24: qty 2

## 2014-10-24 MED ORDER — SODIUM CHLORIDE 0.9 % IJ SOLN
10.0000 mL | INTRAMUSCULAR | Status: DC | PRN
  Administered 2014-10-24: 10 mL via INTRAVENOUS
  Filled 2014-10-24: qty 10

## 2014-10-24 MED ORDER — FAMOTIDINE IN NACL 20-0.9 MG/50ML-% IV SOLN
INTRAVENOUS | Status: AC
  Filled 2014-10-24: qty 50

## 2014-10-24 MED ORDER — FAMOTIDINE IN NACL 20-0.9 MG/50ML-% IV SOLN
20.0000 mg | Freq: Once | INTRAVENOUS | Status: AC
  Administered 2014-10-24: 20 mg via INTRAVENOUS

## 2014-10-24 MED ORDER — SODIUM CHLORIDE 0.9 % IV SOLN
10.0000 mg/kg | Freq: Once | INTRAVENOUS | Status: AC
  Administered 2014-10-24: 550 mg via INTRAVENOUS
  Filled 2014-10-24: qty 22

## 2014-10-24 MED ORDER — DEXAMETHASONE SODIUM PHOSPHATE 10 MG/ML IJ SOLN
8.0000 mg | Freq: Once | INTRAMUSCULAR | Status: AC
  Administered 2014-10-24: 8 mg via INTRAVENOUS

## 2014-10-24 NOTE — Patient Instructions (Signed)
[facility] Discharge Instructions for Patients Receiving Chemotherapy  Today you received the following chemotherapy agents Elotuzumab.  To help prevent nausea and vomiting after your treatment, we encourage you to take your nausea medication as prescribed.   If you develop nausea and vomiting that is not controlled by your nausea medication, call the clinic.   BELOW ARE SYMPTOMS THAT SHOULD BE REPORTED IMMEDIATELY:  *FEVER GREATER THAN 100.5 F  *CHILLS WITH OR WITHOUT FEVER  NAUSEA AND VOMITING THAT IS NOT CONTROLLED WITH YOUR NAUSEA MEDICATION  *UNUSUAL SHORTNESS OF BREATH  *UNUSUAL BRUISING OR BLEEDING  TENDERNESS IN MOUTH AND THROAT WITH OR WITHOUT PRESENCE OF ULCERS  *URINARY PROBLEMS  *BOWEL PROBLEMS  UNUSUAL RASH Items with * indicate a potential emergency and should be followed up as soon as possible.  Feel free to call the clinic you have any questions or concerns. The clinic phone number is [phone].    

## 2014-10-24 NOTE — Patient Instructions (Signed)

## 2014-10-24 NOTE — Progress Notes (Signed)
Had to work with [facility] to obtain blood return prior to hooking patient up to pump.  No blood return when de-accessing port.  Pt reports no pain, stinging or burning.  Flushes easily - no redness or swelling.  PAC heparin locked.  Patient advised may need to have TPA next visit.  Pt voiced understanding.

## 2014-10-24 NOTE — Progress Notes (Signed)
10/24/14 at 1:23pm - BMS [id number]- cycle 4, day 1 study notes- Ms. Guymon into the [facility] for the start of cycle 4. She confirmed that she took her dexamethasone 16 mg (4 pills) at 9:00 am with food. She denies any new adverse events.  The pt's platelet count was within normal limits today.  The pt was instructed to begin her Revlimid dosing today on day 1.  The pt confirmed that she has the Revlimid at home.  The research nurse spoke with Montel Clock, PharmD who adjusted the dose of elotuzumab to today's weight. Obtained kit assignment through the [employer] system and gave that to the pharmacy staff. Spoke with Terri, RN in the infusion about about Ms. Weakland's treatment and provided her with a sign for the pump. Emphasized the rate of infusion, the timing of the premeds, and the use of the 0.22 micron filter. The pt had no questions or concerns about her treatment. The pt was thanked for her continued support of this trial.

## 2014-10-29 LAB — SPEP & IFE WITH QIG
ALPHA-2-GLOBULIN: 14.6 % — AB (ref 7.1–11.8)
Albumin ELP: 60.7 % (ref 55.8–66.1)
Alpha-1-Globulin: 5.8 % — ABNORMAL HIGH (ref 2.9–4.9)
BETA GLOBULIN: 6.8 % (ref 4.7–7.2)
Beta 2: 3.5 % (ref 3.2–6.5)
Gamma Globulin: 8.6 % — ABNORMAL LOW (ref 11.1–18.8)
IGA: 38 mg/dL — AB (ref 69–380)
IgG (Immunoglobin G), Serum: 596 mg/dL — ABNORMAL LOW (ref 690–1700)
IgM, Serum: 28 mg/dL — ABNORMAL LOW (ref 52–322)
TOTAL PROTEIN, SERUM ELECTROPHOR: 6.2 g/dL (ref 6.0–8.3)

## 2014-10-29 LAB — KAPPA/LAMBDA LIGHT CHAINS
KAPPA FREE LGHT CHN: 1.08 mg/dL (ref 0.33–1.94)
KAPPA LAMBDA RATIO: 5.4 — AB (ref 0.26–1.65)
Lambda Free Lght Chn: 0.2 mg/dL — ABNORMAL LOW (ref 0.57–2.63)

## 2014-10-29 LAB — BETA 2 MICROGLOBULIN, SERUM: Beta-2 Microglobulin: 1.89 mg/L (ref <=2.51)

## 2014-10-31 ENCOUNTER — Telehealth: Payer: Self-pay | Admitting: *Deleted

## 2014-10-31 NOTE — Telephone Encounter (Signed)
10/31/14 at 11:44am - The research nurse called the pt to discuss her treatment.  The pt confirmed that she took her 4 dexamethasone tablets this am with food. The pt also verified that she is taking her Revlimid daily.  The research nurse informed the pt that Annamary Rummage, pt financial advocate, got her myeloma treatment approved through [employer] [facility] through 10/25/15.  The pt was delighted to hear the good news.  The BMS Access Support denied the pt since she is receiving Medicare.  The pt was informed that she will now transition to commercial elotuzumab at her next visit on 11/07/14.  Dr. Alvy Bimler is in agreement for the pt to receive commercial elotuzumab on 11/07/14.  The pharmacy has been notified of the change to commercial drug effective 11/07/14.  The research nurse contacted [address] to confirm the status of the approval, but Lenise is off until 11/07/14.  The research nurse called Thermon Leyland, patient financial advocate, and asked her to check into the pt's approval confirmation from [employer].  Raquel will call [employer] this afternoon to confirm the pt's approval.  The research nurse will discontinue the pt's research treatment plan once this approval is confirmed.

## 2014-11-05 ENCOUNTER — Encounter: Payer: Self-pay | Admitting: Hematology and Oncology

## 2014-11-05 NOTE — Progress Notes (Signed)
Mauri at [employer] added- Empliciti- elotuzumab to assistance. Balance $9629.52  08/01/14-08/01/15. I will let Lexine Baton know in research. See prev notes.

## 2014-11-06 ENCOUNTER — Other Ambulatory Visit: Payer: Self-pay | Admitting: *Deleted

## 2014-11-07 ENCOUNTER — Telehealth: Payer: Self-pay | Admitting: Hematology and Oncology

## 2014-11-07 ENCOUNTER — Encounter: Payer: Self-pay | Admitting: Hematology and Oncology

## 2014-11-07 ENCOUNTER — Other Ambulatory Visit (HOSPITAL_BASED_OUTPATIENT_CLINIC_OR_DEPARTMENT_OTHER): Payer: 59

## 2014-11-07 ENCOUNTER — Ambulatory Visit (HOSPITAL_BASED_OUTPATIENT_CLINIC_OR_DEPARTMENT_OTHER): Payer: Medicare Other

## 2014-11-07 ENCOUNTER — Ambulatory Visit: Payer: 59

## 2014-11-07 ENCOUNTER — Ambulatory Visit (HOSPITAL_BASED_OUTPATIENT_CLINIC_OR_DEPARTMENT_OTHER): Payer: Medicare Other | Admitting: Hematology and Oncology

## 2014-11-07 VITALS — BP 128/82 | HR 79 | Temp 98.0°F | Resp 18 | Ht 64.0 in | Wt 123.3 lb

## 2014-11-07 DIAGNOSIS — D6959 Other secondary thrombocytopenia: Secondary | ICD-10-CM

## 2014-11-07 DIAGNOSIS — C9000 Multiple myeloma not having achieved remission: Secondary | ICD-10-CM

## 2014-11-07 DIAGNOSIS — F17200 Nicotine dependence, unspecified, uncomplicated: Secondary | ICD-10-CM

## 2014-11-07 DIAGNOSIS — M549 Dorsalgia, unspecified: Secondary | ICD-10-CM | POA: Diagnosis not present

## 2014-11-07 DIAGNOSIS — G8929 Other chronic pain: Secondary | ICD-10-CM

## 2014-11-07 DIAGNOSIS — Z95828 Presence of other vascular implants and grafts: Secondary | ICD-10-CM

## 2014-11-07 DIAGNOSIS — Z5112 Encounter for antineoplastic immunotherapy: Secondary | ICD-10-CM | POA: Diagnosis not present

## 2014-11-07 DIAGNOSIS — Z720 Tobacco use: Secondary | ICD-10-CM

## 2014-11-07 DIAGNOSIS — T50905A Adverse effect of unspecified drugs, medicaments and biological substances, initial encounter: Secondary | ICD-10-CM

## 2014-11-07 LAB — COMPREHENSIVE METABOLIC PANEL (CC13)
ALT: 17 U/L (ref 0–55)
ANION GAP: 7 meq/L (ref 3–11)
AST: 20 U/L (ref 5–34)
Albumin: 3.6 g/dL (ref 3.5–5.0)
Alkaline Phosphatase: 82 U/L (ref 40–150)
BILIRUBIN TOTAL: 0.48 mg/dL (ref 0.20–1.20)
BUN: 10.3 mg/dL (ref 7.0–26.0)
CHLORIDE: 107 meq/L (ref 98–109)
CO2: 24 mEq/L (ref 22–29)
Calcium: 9 mg/dL (ref 8.4–10.4)
Creatinine: 0.7 mg/dL (ref 0.6–1.1)
EGFR: >90 mL/min/{1.73_m2} (ref >90)
GLUCOSE: 124 mg/dL (ref 70–140)
POTASSIUM: 4 meq/L (ref 3.5–5.1)
Sodium: 138 mEq/L (ref 136–145)
TOTAL PROTEIN: 6.1 g/dL — AB (ref 6.4–8.3)

## 2014-11-07 LAB — CBC WITH DIFFERENTIAL/PLATELET
BASO%: 1.4 % (ref 0.0–2.0)
Basophils Absolute: 0.1 10*3/uL (ref 0.0–0.1)
EOS%: 6 % (ref 0.0–7.0)
Eosinophils Absolute: 0.3 10*3/uL (ref 0.0–0.5)
HCT: 43.7 % (ref 34.8–46.6)
HGB: 14.3 g/dL (ref 11.6–15.9)
LYMPH%: 12.2 % — AB (ref 14.0–49.7)
MCH: 30.7 pg (ref 25.1–34.0)
MCHC: 32.6 g/dL (ref 31.5–36.0)
MCV: 94.1 fL (ref 79.5–101.0)
MONO#: 0.4 10*3/uL (ref 0.1–0.9)
MONO%: 9.2 % (ref 0.0–14.0)
NEUT#: 3.4 10*3/uL (ref 1.5–6.5)
NEUT%: 71.2 % (ref 38.4–76.8)
PLATELETS: 136 10*3/uL — AB (ref 145–400)
RBC: 4.65 10*6/uL (ref 3.70–5.45)
RDW: 17.5 % — ABNORMAL HIGH (ref 11.2–14.5)
WBC: 4.7 10*3/uL (ref 3.9–10.3)
lymph#: 0.6 10*3/uL — ABNORMAL LOW (ref 0.9–3.3)

## 2014-11-07 MED ORDER — SODIUM CHLORIDE 0.9 % IV SOLN
Freq: Once | Status: AC
  Administered 2014-11-07: 13:00:00 via INTRAVENOUS
  Filled 2014-11-07: qty 230

## 2014-11-07 MED ORDER — FAMOTIDINE IN NACL 20-0.9 MG/50ML-% IV SOLN
INTRAVENOUS | Status: AC
  Filled 2014-11-07: qty 50

## 2014-11-07 MED ORDER — DIPHENHYDRAMINE HCL 25 MG PO CAPS
ORAL_CAPSULE | ORAL | Status: AC
  Filled 2014-11-07: qty 1

## 2014-11-07 MED ORDER — SODIUM CHLORIDE 0.9 % IV SOLN
INTRAVENOUS | Status: DC
  Administered 2014-11-07: 11:00:00 via INTRAVENOUS

## 2014-11-07 MED ORDER — ACETAMINOPHEN 325 MG PO TABS
ORAL_TABLET | ORAL | Status: AC
  Filled 2014-11-07: qty 2

## 2014-11-07 MED ORDER — DEXAMETHASONE SODIUM PHOSPHATE 10 MG/ML IJ SOLN
10.0000 mg | Freq: Once | INTRAMUSCULAR | Status: AC
  Administered 2014-11-07: 10 mg via INTRAVENOUS

## 2014-11-07 MED ORDER — DIPHENHYDRAMINE HCL 25 MG PO CAPS
50.0000 mg | ORAL_CAPSULE | Freq: Once | ORAL | Status: AC
  Administered 2014-11-07: 50 mg via ORAL

## 2014-11-07 MED ORDER — FAMOTIDINE IN NACL 20-0.9 MG/50ML-% IV SOLN
20.0000 mg | Freq: Once | INTRAVENOUS | Status: AC
  Administered 2014-11-07: 20 mg via INTRAVENOUS

## 2014-11-07 MED ORDER — ACETAMINOPHEN 325 MG PO TABS
650.0000 mg | ORAL_TABLET | Freq: Once | ORAL | Status: AC
  Administered 2014-11-07: 650 mg via ORAL

## 2014-11-07 MED ORDER — HEPARIN SOD (PORK) LOCK FLUSH 100 UNIT/ML IV SOLN
500.0000 [IU] | Freq: Once | INTRAVENOUS | Status: AC | PRN
  Administered 2014-11-07: 500 [IU]
  Filled 2014-11-07: qty 5

## 2014-11-07 MED ORDER — SODIUM CHLORIDE 0.9 % IJ SOLN
10.0000 mL | INTRAMUSCULAR | Status: DC | PRN
  Administered 2014-11-07: 10 mL
  Filled 2014-11-07: qty 10

## 2014-11-07 MED ORDER — DIPHENHYDRAMINE HCL 25 MG PO CAPS
ORAL_CAPSULE | ORAL | Status: AC
  Filled 2014-11-07: qty 2

## 2014-11-07 MED ORDER — DEXAMETHASONE SODIUM PHOSPHATE 10 MG/ML IJ SOLN
INTRAMUSCULAR | Status: AC
  Filled 2014-11-07: qty 1

## 2014-11-07 MED ORDER — SODIUM CHLORIDE 0.9 % IJ SOLN
10.0000 mL | INTRAMUSCULAR | Status: DC | PRN
  Administered 2014-11-07: 10 mL via INTRAVENOUS
  Filled 2014-11-07: qty 10

## 2014-11-07 NOTE — Telephone Encounter (Signed)
11/07/14 at 10:44am - The research was notified on 11/05/14 that the pt's elotuzumab was approved by [employer].  Dr. Alvy Bimler is in agreement to transition the pt to commercial on 11/07/14.  The pt's research treatment was discontinued on 11/06/14 by Raoul Pitch, research nurse.  The pt was informed today that she will no longer receive study provided, elotuzumab. The pharmacy staff and the infusion nurse were all informed that the pt is off study treatment.  The pt was thanked for her support of this trial.  The pt's study discontinuation was entered into [facility].

## 2014-11-07 NOTE — Assessment & Plan Note (Signed)
Clinically, she is tolerating treatment very well. Recent light chain study show she have responded very well to treatment.  I will see her back in 2 weeks for toxicity review. In the meantime, she will proceed with treatment without dose adjustment. I plan to reduce dexamethasone dose further in the future.

## 2014-11-07 NOTE — Assessment & Plan Note (Signed)
This is likely due to recent treatment. The patient denies recent history of bleeding such as epistaxis, hematuria or hematochezia. She is asymptomatic from the low platelet count. I will observe for now.  she does not require transfusion now. I will continue the chemotherapy at current dose without dosage adjustment.  If the thrombocytopenia gets progressive worse in the future, I might have to delay her treatment or adjust the chemotherapy dose.   

## 2014-11-07 NOTE — Assessment & Plan Note (Signed)
I spent some time counseling the patient the importance of tobacco cessation. She is currently not interested to quit now. 

## 2014-11-07 NOTE — Progress Notes (Signed)
[place] OFFICE PROGRESS NOTE  Patient Care Team: Marin Olp, MD as PCP - General (Family Medicine) Jeanann Lewandowsky, MD as Consulting Physician (Medical Oncology) Marin Olp, MD as Consulting Physician (Family Medicine) Heath Lark, MD as Consulting Physician (Hematology and Oncology)  SUMMARY OF ONCOLOGIC HISTORY: Oncology History   Multiple myeloma, kappa light chain disease, Durie-Salmon stage III     Multiple myeloma   07/16/2006 Bone Marrow Biopsy BM biopsy is non-diagnostic   09/21/2006 Procedure L5 vertebral biopsy 5% plasma cell   10/25/2006 Bone Marrow Biopsy BM biopsy is hypercellular with 5% plasma cell   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   01/03/2007 -  Radiation Therapy Approximate date only, received RT for plasmacytoma followed by surgery   09/14/2007 Initial Diagnosis MULTIPLE  MYELOMA   10/05/2007 Bone Marrow Biopsy Bm biopsy was negative   06/18/2008 Bone Marrow Biopsy BM biopsy was negative   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 [facility]   04/13/2011 Relapse/Recurrence Disease relapse   04/14/2011 Bone Marrow Biopsy Bm biopsy showed 2 % plasma cell   04/27/2011 Relapse/Recurrence Disease relapse, treated with Velcade/Cytoxan/Dex   09/07/2011 - 07/28/2013 Chemotherapy She has been receiving Velcade   12/27/2011 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   08/02/2014 -  Chemotherapy She  enrolled in clinical trial using combination therapy with Revlimid, dexamethasone and Elotuzumab    INTERVAL HISTORY: Please see below for problem oriented charting. She is seen prior to her chemotherapy. Her symptoms are about the same. She continues to have chronic back pain but not worse. She has appointment pending to see her dentist soon. She is still smoking.  REVIEW OF SYSTEMS:   Constitutional: Denies fevers, chills or abnormal weight loss Eyes: Denies blurriness of vision Ears, nose, mouth, throat, and face: Denies mucositis or sore throat Respiratory: Denies cough, dyspnea or wheezes Cardiovascular: Denies palpitation, chest discomfort or lower extremity swelling Gastrointestinal:  Denies nausea, heartburn or change in bowel habits Skin: Denies abnormal skin rashes Lymphatics: Denies new lymphadenopathy or easy bruising Neurological:Denies numbness, tingling or new weaknesses Behavioral/Psych: Mood is stable, no new changes  All other systems were reviewed with the patient and are negative.  I have reviewed the past medical history, past surgical history, social history and family history with the patient and they are unchanged from previous note.  ALLERGIES:  is allergic to codeine; ibuprofen; and albuterol.  MEDICATIONS:  Current Outpatient Prescriptions  Medication Sig Dispense Refill  . Alum & Mag Hydroxide-Simeth (MAGIC MOUTHWASH) SOLN Take 10 mLs by mouth 4 (four) times daily as needed for mouth pain. Swish and spit  Or  Swish and swallow. 300 mL 2  . aspirin 81 MG tablet Take 81 mg by mouth daily.    . Cholecalciferol (VITAMIN D-3) 5000 UNITS TABS Take 1 tablet by mouth every morning.    Marland Kitchen dexamethasone (DECADRON) 4 MG tablet Take as directed by chemotherapy protocol, 7 pills on the day of chemo 90 tablet 3  . dextromethorphan-guaiFENesin (  MUCINEX DM) 30-600 MG per 12 hr tablet Take 1 tablet by mouth 2 (two) times daily as needed for cough (cough).    Marland Kitchen ipratropium  (ATROVENT HFA) 17 MCG/ACT inhaler Inhale 2 puffs into the lungs every 4 (four) hours as needed for wheezing. 1 Inhaler 12  . lenalidomide (REVLIMID) 25 MG capsule TAKE ONE CAPSULE BY MOUTH EVERY DAY FOR 21 DAYS THEN 7 DAYS OFF. 21 capsule 0  . lidocaine-prilocaine (EMLA) cream Apply 1 application topically as needed. 30 g 0  . losartan (COZAAR) 50 MG tablet Take 0.5-1 tablets (25-50 mg total) by mouth daily. 30 tablet 3  . magnesium hydroxide (MILK OF MAGNESIA) 400 MG/5ML suspension Take 30 mLs by mouth daily as needed for mild constipation (constipation).     . morphine (MS CONTIN) 60 MG 12 hr tablet Take 60 mg by mouth every 12 (twelve) hours.    Marland Kitchen morphine (MSIR) 30 MG tablet Take 30 mg by mouth every 4 (four) hours as needed for severe pain (pain).     . prochlorperazine (COMPAZINE) 10 MG tablet Take 1 tablet (10 mg total) by mouth every 6 (six) hours as needed for nausea. 60 tablet 3  . [DISCONTINUED] amLODipine (NORVASC) 5 MG tablet Take 1 tablet (5 mg total) by mouth daily. 90 tablet 3  . [DISCONTINUED] gabapentin (NEURONTIN) 600 MG tablet Take 600 mg by mouth 3 (three) times daily.       No current facility-administered medications for this visit.   Facility-Administered Medications Ordered in Other Visits  Medication Dose Route Frequency Provider Last Rate Last Dose  . sodium chloride 0.9 % injection 10 mL  10 mL Intravenous PRN Heath Lark, MD   10 mL at 02/26/14 1425  . sodium chloride 0.9 % injection 10 mL  10 mL Intravenous PRN Chauncey Cruel, MD   10 mL at 04/05/14 1333    PHYSICAL EXAMINATION: ECOG PERFORMANCE STATUS: 1 - Symptomatic but completely ambulatory  Filed Vitals:   11/07/14 1054  BP: 128/82  Pulse: 79  Temp: 98 F (36.7 C)  Resp: 18   Filed Weights   11/07/14 1054  Weight: 123 lb 4.8 oz (55.929 kg)    GENERAL:alert, no distress and comfortable SKIN: skin color, texture, turgor are normal, no rashes or significant lesions EYES: normal, Conjunctiva are  pink and non-injected, sclera clear OROPHARYNX:no exudate, no erythema and lips, buccal mucosa, and tongue normal  NECK: supple, thyroid normal size, non-tender, without nodularity LYMPH:  no palpable lymphadenopathy in the cervical, axillary or inguinal LUNGS: clear to auscultation and percussion with normal breathing effort HEART: regular rate & rhythm and no murmurs and no lower extremity edema ABDOMEN:abdomen soft, non-tender and normal bowel sounds Musculoskeletal:no cyanosis of digits and no clubbing  NEURO: alert & oriented x 3 with fluent speech, no focal motor/sensory deficits  LABORATORY DATA:  I have reviewed the data as listed    Component Value Date/Time   NA 138 11/07/2014 1031   NA 141 09/14/2014 1436   K 4.0 11/07/2014 1031   K 3.6* 09/14/2014 1436   CL 105 09/14/2014 1436   CL 107 03/10/2013 1014   CO2 24 11/07/2014 1031   CO2 19 09/14/2014 1436   GLUCOSE 124 11/07/2014 1031   GLUCOSE 187* 09/14/2014 1436   GLUCOSE 111* 03/10/2013 1014   GLUCOSE 98 08/31/2006 1024   BUN 10.3 11/07/2014 1031   BUN 15 09/14/2014 1436   CREATININE 0.7 11/07/2014 1031   CREATININE 0.60 09/14/2014 1436  CALCIUM 9.0 11/07/2014 1031   CALCIUM 9.0 09/14/2014 1436   PROT 6.1* 11/07/2014 1031   PROT 6.6 09/11/2014 0336   ALBUMIN 3.6 11/07/2014 1031   ALBUMIN 3.4* 09/11/2014 0336   AST 20 11/07/2014 1031   AST 18 09/11/2014 0336   ALT 17 11/07/2014 1031   ALT 18 09/11/2014 0336   ALKPHOS 82 11/07/2014 1031   ALKPHOS 113 09/11/2014 0336   BILITOT 0.48 11/07/2014 1031   BILITOT 0.6 09/11/2014 0336   GFRNONAA >90 09/14/2014 1436   GFRAA >90 09/14/2014 1436    No results found for: SPEP, UPEP  Lab Results  Component Value Date   WBC 4.7 11/07/2014   NEUTROABS 3.4 11/07/2014   HGB 14.3 11/07/2014   HCT 43.7 11/07/2014   MCV 94.1 11/07/2014   PLT 136* 11/07/2014      Chemistry      Component Value Date/Time   NA 138 11/07/2014 1031   NA 141 09/14/2014 1436   K 4.0  11/07/2014 1031   K 3.6* 09/14/2014 1436   CL 105 09/14/2014 1436   CL 107 03/10/2013 1014   CO2 24 11/07/2014 1031   CO2 19 09/14/2014 1436   BUN 10.3 11/07/2014 1031   BUN 15 09/14/2014 1436   CREATININE 0.7 11/07/2014 1031   CREATININE 0.60 09/14/2014 1436      Component Value Date/Time   CALCIUM 9.0 11/07/2014 1031   CALCIUM 9.0 09/14/2014 1436   ALKPHOS 82 11/07/2014 1031   ALKPHOS 113 09/11/2014 0336   AST 20 11/07/2014 1031   AST 18 09/11/2014 0336   ALT 17 11/07/2014 1031   ALT 18 09/11/2014 0336   BILITOT 0.48 11/07/2014 1031   BILITOT 0.6 09/11/2014 0336     ASSESSMENT & PLAN:  Multiple myeloma Clinically, she is tolerating treatment very well. Recent light chain study show she have responded very well to treatment.  I will see her back in 2 weeks for toxicity review. In the meantime, she will proceed with treatment without dose adjustment. I plan to reduce dexamethasone dose further in the future.  Thrombocytopenia due to drugs This is likely due to recent treatment. The patient denies recent history of bleeding such as epistaxis, hematuria or hematochezia. She is asymptomatic from the low platelet count. I will observe for now.  she does not require transfusion now. I will continue the chemotherapy at current dose without dosage adjustment.  If the thrombocytopenia gets progressive worse in the future, I might have to delay her treatment or adjust the chemotherapy dose.    Smoker I spent some time counseling the patient the importance of tobacco cessation. She is currently not interested to quit now.   No orders of the defined types were placed in this encounter.   All questions were answered. The patient knows to call the clinic with any problems, questions or concerns. No barriers to learning was detected. I spent 30 minutes counseling the patient face to face. The total time spent in the appointment was 40 minutes and more than 50% was on counseling and  review of test results     [facility], Deniah Saia, MD 11/07/2014 9:05 PM

## 2014-11-07 NOTE — Telephone Encounter (Signed)
Pt confirmed labs/ov per 01/06 POF, gave pt AVS.... KJ, sent msg to add chemo °

## 2014-11-07 NOTE — Patient Instructions (Signed)

## 2014-11-08 ENCOUNTER — Telehealth: Payer: Self-pay | Admitting: *Deleted

## 2014-11-08 NOTE — Telephone Encounter (Signed)
Per staff message and POF I have scheduled appts. Advised scheduler of appts and that first available was given. JMW

## 2014-11-09 ENCOUNTER — Telehealth: Payer: Self-pay | Admitting: *Deleted

## 2014-11-09 NOTE — Telephone Encounter (Signed)
11/09/14 at 1:15pm - The pt called and stated that she needed some additional information about her financial assistance.  The pt was given [facility] number, and the pt was advised to speak to her directly about her financial status.  The pt was told that her elotuzumab and Revlimid was approved by [employer].  The pt was encouraged to contact Lenise, pt financial advocate, to discuss her financial assistance.  The pt verbalized understanding.

## 2014-11-12 ENCOUNTER — Encounter: Payer: Self-pay | Admitting: Hematology and Oncology

## 2014-11-12 NOTE — Progress Notes (Signed)
Received letter from [employer].  Pt is approved for $10,000 for co-payments, co-insurances and deductibles required to treat and /or manage MM.  Valid for 12 months from 10/24/14.  Sent a letter to [facility] in billing.

## 2014-11-13 ENCOUNTER — Other Ambulatory Visit: Payer: Self-pay | Admitting: *Deleted

## 2014-11-13 DIAGNOSIS — C9000 Multiple myeloma not having achieved remission: Secondary | ICD-10-CM

## 2014-11-13 NOTE — Telephone Encounter (Signed)
THIS REFILL REQUEST FOR REVLIMID WAS PLACED ON DR.GORSUCH'S DESK. 

## 2014-11-15 DIAGNOSIS — G894 Chronic pain syndrome: Secondary | ICD-10-CM | POA: Diagnosis not present

## 2014-11-15 DIAGNOSIS — Z79891 Long term (current) use of opiate analgesic: Secondary | ICD-10-CM | POA: Diagnosis not present

## 2014-11-15 DIAGNOSIS — G893 Neoplasm related pain (acute) (chronic): Secondary | ICD-10-CM | POA: Diagnosis not present

## 2014-11-15 MED ORDER — LENALIDOMIDE 25 MG PO CAPS: ORAL_CAPSULE | ORAL | Status: DC

## 2014-11-15 NOTE — Addendum Note (Signed)
Addended by: Wyonia Hough on: 11/15/2014 02:54 PM   Modules accepted: Orders

## 2014-11-21 ENCOUNTER — Ambulatory Visit (HOSPITAL_BASED_OUTPATIENT_CLINIC_OR_DEPARTMENT_OTHER): Payer: 59

## 2014-11-21 ENCOUNTER — Telehealth: Payer: Self-pay | Admitting: Hematology and Oncology

## 2014-11-21 ENCOUNTER — Ambulatory Visit: Payer: 59

## 2014-11-21 ENCOUNTER — Encounter: Payer: Self-pay | Admitting: Hematology and Oncology

## 2014-11-21 ENCOUNTER — Other Ambulatory Visit (HOSPITAL_BASED_OUTPATIENT_CLINIC_OR_DEPARTMENT_OTHER): Payer: 59

## 2014-11-21 ENCOUNTER — Ambulatory Visit (HOSPITAL_BASED_OUTPATIENT_CLINIC_OR_DEPARTMENT_OTHER): Payer: 59 | Admitting: Hematology and Oncology

## 2014-11-21 ENCOUNTER — Encounter: Payer: Self-pay | Admitting: *Deleted

## 2014-11-21 ENCOUNTER — Telehealth: Payer: Self-pay | Admitting: *Deleted

## 2014-11-21 VITALS — BP 152/97 | HR 74 | Temp 97.7°F | Resp 18 | Ht 64.0 in | Wt 122.9 lb

## 2014-11-21 DIAGNOSIS — Z720 Tobacco use: Secondary | ICD-10-CM | POA: Diagnosis not present

## 2014-11-21 DIAGNOSIS — Z5112 Encounter for antineoplastic immunotherapy: Secondary | ICD-10-CM

## 2014-11-21 DIAGNOSIS — R252 Cramp and spasm: Secondary | ICD-10-CM | POA: Diagnosis not present

## 2014-11-21 DIAGNOSIS — C9000 Multiple myeloma not having achieved remission: Secondary | ICD-10-CM

## 2014-11-21 DIAGNOSIS — D6959 Other secondary thrombocytopenia: Secondary | ICD-10-CM

## 2014-11-21 DIAGNOSIS — Z95828 Presence of other vascular implants and grafts: Secondary | ICD-10-CM

## 2014-11-21 DIAGNOSIS — T50905A Adverse effect of unspecified drugs, medicaments and biological substances, initial encounter: Secondary | ICD-10-CM

## 2014-11-21 HISTORY — DX: Cramp and spasm: R25.2

## 2014-11-21 LAB — COMPREHENSIVE METABOLIC PANEL (CC13)
ALK PHOS: 75 U/L (ref 40–150)
ALT: 12 U/L (ref 0–55)
AST: 16 U/L (ref 5–34)
Albumin: 3.5 g/dL (ref 3.5–5.0)
Anion Gap: 7 mEq/L (ref 3–11)
BILIRUBIN TOTAL: 0.47 mg/dL (ref 0.20–1.20)
BUN: 14.6 mg/dL (ref 7.0–26.0)
CO2: 21 mEq/L — ABNORMAL LOW (ref 22–29)
CREATININE: 0.7 mg/dL (ref 0.6–1.1)
Calcium: 8.6 mg/dL (ref 8.4–10.4)
Chloride: 112 mEq/L — ABNORMAL HIGH (ref 98–109)
EGFR: >90 mL/min/{1.73_m2} (ref >90)
Glucose: 118 mg/dl (ref 70–140)
Potassium: 4.1 mEq/L (ref 3.5–5.1)
Sodium: 139 mEq/L (ref 136–145)
Total Protein: 6 g/dL — ABNORMAL LOW (ref 6.4–8.3)

## 2014-11-21 LAB — CBC WITH DIFFERENTIAL/PLATELET
BASO%: 2.8 % — ABNORMAL HIGH (ref 0.0–2.0)
BASOS ABS: 0.1 10*3/uL (ref 0.0–0.1)
EOS%: 2.4 % (ref 0.0–7.0)
Eosinophils Absolute: 0.1 10*3/uL (ref 0.0–0.5)
HEMATOCRIT: 43.5 % (ref 34.8–46.6)
HGB: 14.4 g/dL (ref 11.6–15.9)
LYMPH%: 16.7 % (ref 14.0–49.7)
MCH: 31 pg (ref 25.1–34.0)
MCHC: 33.1 g/dL (ref 31.5–36.0)
MCV: 93.5 fL (ref 79.5–101.0)
MONO#: 0.3 10*3/uL (ref 0.1–0.9)
MONO%: 6.8 % (ref 0.0–14.0)
NEUT%: 71.3 % (ref 38.4–76.8)
NEUTROS ABS: 3 10*3/uL (ref 1.5–6.5)
Platelets: 163 10*3/uL (ref 145–400)
RBC: 4.65 10*6/uL (ref 3.70–5.45)
RDW: 15.7 % — ABNORMAL HIGH (ref 11.2–14.5)
WBC: 4.3 10*3/uL (ref 3.9–10.3)
lymph#: 0.7 10*3/uL — ABNORMAL LOW (ref 0.9–3.3)

## 2014-11-21 MED ORDER — DEXAMETHASONE SODIUM PHOSPHATE 10 MG/ML IJ SOLN
10.0000 mg | Freq: Once | INTRAMUSCULAR | Status: AC
  Administered 2014-11-21: 10 mg via INTRAVENOUS

## 2014-11-21 MED ORDER — SODIUM CHLORIDE 0.9 % IJ SOLN
10.0000 mL | INTRAMUSCULAR | Status: DC | PRN
  Administered 2014-11-21: 10 mL
  Filled 2014-11-21: qty 10

## 2014-11-21 MED ORDER — DIPHENHYDRAMINE HCL 25 MG PO CAPS
ORAL_CAPSULE | ORAL | Status: AC
  Filled 2014-11-21: qty 2

## 2014-11-21 MED ORDER — ACETAMINOPHEN 325 MG PO TABS
ORAL_TABLET | ORAL | Status: AC
  Filled 2014-11-21: qty 2

## 2014-11-21 MED ORDER — HEPARIN SOD (PORK) LOCK FLUSH 100 UNIT/ML IV SOLN
500.0000 [IU] | Freq: Once | INTRAVENOUS | Status: AC | PRN
  Administered 2014-11-21: 500 [IU]
  Filled 2014-11-21: qty 5

## 2014-11-21 MED ORDER — SODIUM CHLORIDE 0.9 % IV SOLN
INTRAVENOUS | Status: DC
Start: 2014-11-21 — End: 2014-11-21
  Administered 2014-11-21: 12:00:00 via INTRAVENOUS

## 2014-11-21 MED ORDER — ACETAMINOPHEN 325 MG PO TABS
650.0000 mg | ORAL_TABLET | Freq: Once | ORAL | Status: AC
  Administered 2014-11-21: 650 mg via ORAL

## 2014-11-21 MED ORDER — SODIUM CHLORIDE 0.9 % IJ SOLN
10.0000 mL | INTRAMUSCULAR | Status: DC | PRN
  Administered 2014-11-21: 10 mL via INTRAVENOUS
  Filled 2014-11-21: qty 10

## 2014-11-21 MED ORDER — DEXAMETHASONE SODIUM PHOSPHATE 10 MG/ML IJ SOLN
INTRAMUSCULAR | Status: AC
  Filled 2014-11-21: qty 1

## 2014-11-21 MED ORDER — FAMOTIDINE IN NACL 20-0.9 MG/50ML-% IV SOLN
20.0000 mg | Freq: Once | INTRAVENOUS | Status: AC
  Administered 2014-11-21: 20 mg via INTRAVENOUS

## 2014-11-21 MED ORDER — FAMOTIDINE IN NACL 20-0.9 MG/50ML-% IV SOLN
INTRAVENOUS | Status: AC
  Filled 2014-11-21: qty 50

## 2014-11-21 MED ORDER — DIPHENHYDRAMINE HCL 25 MG PO CAPS
50.0000 mg | ORAL_CAPSULE | Freq: Once | ORAL | Status: AC
  Administered 2014-11-21: 50 mg via ORAL

## 2014-11-21 MED ORDER — NONFORMULARY OR COMPOUNDED ITEM
Freq: Once | Status: AC
  Administered 2014-11-21: 13:00:00 via INTRAVENOUS
  Filled 2014-11-21: qty 230

## 2014-11-21 MED ORDER — CYCLOBENZAPRINE HCL 10 MG PO TABS: 10.0000 mg | ORAL_TABLET | Freq: Three times a day (TID) | ORAL | Status: DC | PRN

## 2014-11-21 NOTE — Patient Instructions (Signed)
[facility] Discharge Instructions for Patients Receiving Chemotherapy  Today you received the following chemotherapy agents Elotuzumab.  To help prevent nausea and vomiting after your treatment, we encourage you to take your nausea medication as prescribed.   If you develop nausea and vomiting that is not controlled by your nausea medication, call the clinic.   BELOW ARE SYMPTOMS THAT SHOULD BE REPORTED IMMEDIATELY:  *FEVER GREATER THAN 100.5 F  *CHILLS WITH OR WITHOUT FEVER  NAUSEA AND VOMITING THAT IS NOT CONTROLLED WITH YOUR NAUSEA MEDICATION  *UNUSUAL SHORTNESS OF BREATH  *UNUSUAL BRUISING OR BLEEDING  TENDERNESS IN MOUTH AND THROAT WITH OR WITHOUT PRESENCE OF ULCERS  *URINARY PROBLEMS  *BOWEL PROBLEMS  UNUSUAL RASH Items with * indicate a potential emergency and should be followed up as soon as possible.  Feel free to call the clinic you have any questions or concerns. The clinic phone number is [phone].    

## 2014-11-21 NOTE — Progress Notes (Signed)
PAC sluggish to flush.  Blood return noted without difficulty.  Cameo, RN and Dr Alvy Bimler made aware.

## 2014-11-21 NOTE — Telephone Encounter (Signed)
Per staff message and POF I have scheduled appts. Advised scheduler of appts. JMW  

## 2014-11-21 NOTE — Telephone Encounter (Signed)
Gave avs & calendar for February/March. Sent message to schedule treatment. °

## 2014-11-21 NOTE — Progress Notes (Signed)
11/21/14 at 11:44am- Re-consent visit.  The research nurse met with the pt today during her routine appointments to discuss the new consent version 3 dated 09/26/14.  The pt transitioned to commercial elotzumab on 11/07/14.  The pt was informed that there is some new information in the consent.  The pt reviewed the new changes with the research nurse, and the pt had no questions or concerns about the consent.  The pt signed the new version 3 consent today, and she was given a copy for her records.  The pt was thanked for her participation in the study.    Brion Aliment RN, BSN Clinical Research Nurse 11/21/2014 11:55 AM

## 2014-11-21 NOTE — Patient Instructions (Signed)

## 2014-11-22 NOTE — Assessment & Plan Note (Signed)
She complained of muscle cramps/spasm. I recommend a trial of muscle relaxant.

## 2014-11-22 NOTE — Assessment & Plan Note (Signed)
This is likely due to recent treatment. The patient denies recent history of bleeding such as epistaxis, hematuria or hematochezia. She is asymptomatic from the low platelet count. I will observe for now.  she does not require transfusion now. I will continue the chemotherapy at current dose without dosage adjustment.  If the thrombocytopenia gets progressive worse in the future, I might have to delay her treatment or adjust the chemotherapy dose.   

## 2014-11-22 NOTE — Progress Notes (Signed)
[facility] OFFICE PROGRESS NOTE  Patient Care Team: Shelva Majestic, MD as PCP - General (Family Medicine) Eddie Candle, MD as Consulting Physician (Medical Oncology) Shelva Majestic, MD as Consulting Physician (Family Medicine) Artis Delay, MD as Consulting Physician (Hematology and Oncology)  SUMMARY OF ONCOLOGIC HISTORY: Oncology History   Multiple myeloma, kappa light chain disease, Durie-Salmon stage III     Multiple myeloma   07/16/2006 Bone Marrow Biopsy BM biopsy is non-diagnostic   09/21/2006 Procedure L5 vertebral biopsy 5% plasma cell   10/25/2006 Bone Marrow Biopsy BM biopsy is hypercellular with 5% plasma cell   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   01/03/2007 -  Radiation Therapy Approximate date only, received RT for plasmacytoma followed by surgery   09/14/2007 Initial Diagnosis MULTIPLE  MYELOMA   10/05/2007 Bone Marrow Biopsy Bm biopsy was negative   06/18/2008 Bone Marrow Biopsy BM biopsy was negative   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 [facility]   04/13/2011 Relapse/Recurrence Disease relapse   04/14/2011 Bone Marrow Biopsy Bm biopsy showed 2 % plasma cell   04/27/2011 Relapse/Recurrence Disease relapse, treated with Velcade/Cytoxan/Dex   09/07/2011 - 07/28/2013 Chemotherapy She has been receiving Velcade   12/27/2011 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   08/02/2014 -  Chemotherapy She  enrolled in clinical trial using combination therapy with Revlimid, dexamethasone and Elotuzumab    INTERVAL HISTORY: Please see below for problem oriented charting. She is seen prior to her treatment today. She complained of lower back pain/muscle spasm. She denies rib pain. She still waiting for her appointment to see a dentist for issues related to poor dentition. She continues to smoke.  REVIEW OF SYSTEMS:   Constitutional: Denies fevers, chills or abnormal weight loss Eyes: Denies blurriness of vision Ears, nose, mouth, throat, and face: Denies mucositis or sore throat Respiratory: Denies cough, dyspnea or wheezes Cardiovascular: Denies palpitation, chest discomfort or lower extremity swelling Gastrointestinal:  Denies nausea, heartburn or change in bowel habits Skin: Denies abnormal skin rashes Lymphatics: Denies new lymphadenopathy or easy bruising Neurological:Denies numbness, tingling or new weaknesses Behavioral/Psych: Mood is stable, no new changes  All other systems were reviewed with the patient and are negative.  I have reviewed the past medical history, past surgical history, social history and family history with the patient and they are unchanged from previous note.  ALLERGIES:  is allergic to codeine; ibuprofen; and albuterol.  MEDICATIONS:  Current Outpatient Prescriptions  Medication Sig Dispense Refill  . Alum & Mag Hydroxide-Simeth (MAGIC MOUTHWASH) SOLN Take 10 mLs by mouth 4 (four) times daily as needed for mouth pain. Swish and spit  Or  Swish and swallow. 300 mL 2  . aspirin 81 MG tablet Take 81 mg by mouth daily.    . Cholecalciferol (VITAMIN D-3) 5000 UNITS TABS Take 1 tablet by mouth every morning.    . cyclobenzaprine (FLEXERIL) 10 MG tablet Take 1 tablet (10 mg total) by mouth 3 (three) times daily as needed for muscle  spasms. 60 tablet 0  . dexamethasone (DECADRON) 4 MG tablet Take as directed by chemotherapy protocol, 7 pills on the day of chemo 90  tablet 3  . dextromethorphan-guaiFENesin (MUCINEX DM) 30-600 MG per 12 hr tablet Take 1 tablet by mouth 2 (two) times daily as needed for cough (cough).    Marland Kitchen ipratropium (ATROVENT HFA) 17 MCG/ACT inhaler Inhale 2 puffs into the lungs every 4 (four) hours as needed for wheezing. 1 Inhaler 12  . lenalidomide (REVLIMID) 25 MG capsule TAKE ONE CAPSULE BY MOUTH EVERY DAY FOR 21 DAYS THEN 7 DAYS OFF. 21 capsule 0  . lidocaine-prilocaine (EMLA) cream Apply 1 application topically as needed. 30 g 0  . losartan (COZAAR) 50 MG tablet Take 0.5-1 tablets (25-50 mg total) by mouth daily. 30 tablet 3  . magnesium hydroxide (MILK OF MAGNESIA) 400 MG/5ML suspension Take 30 mLs by mouth daily as needed for mild constipation (constipation).     . morphine (MS CONTIN) 60 MG 12 hr tablet Take 60 mg by mouth every 12 (twelve) hours.    Marland Kitchen morphine (MSIR) 30 MG tablet Take 30 mg by mouth every 4 (four) hours as needed for severe pain (pain).     . prochlorperazine (COMPAZINE) 10 MG tablet Take 1 tablet (10 mg total) by mouth every 6 (six) hours as needed for nausea. 60 tablet 3  . [DISCONTINUED] amLODipine (NORVASC) 5 MG tablet Take 1 tablet (5 mg total) by mouth daily. 90 tablet 3  . [DISCONTINUED] gabapentin (NEURONTIN) 600 MG tablet Take 600 mg by mouth 3 (three) times daily.       No current facility-administered medications for this visit.   Facility-Administered Medications Ordered in Other Visits  Medication Dose Route Frequency Provider Last Rate Last Dose  . sodium chloride 0.9 % injection 10 mL  10 mL Intravenous PRN Heath Lark, MD   10 mL at 02/26/14 1425  . sodium chloride 0.9 % injection 10 mL  10 mL Intravenous PRN Chauncey Cruel, MD   10 mL at 04/05/14 1333  . sodium chloride 0.9 % injection 10 mL  10 mL Intravenous PRN Heath Lark, MD   10 mL at 11/21/14 1034    PHYSICAL EXAMINATION: ECOG PERFORMANCE STATUS: 0 - Asymptomatic  Filed Vitals:   11/21/14 1056  BP: 152/97  Pulse: 74  Temp: 97.7 F  (36.5 C)  Resp: 18   Filed Weights   11/21/14 1056  Weight: 122 lb 14.4 oz (55.747 kg)    GENERAL:alert, no distress and comfortable SKIN: skin color, texture, turgor are normal, no rashes or significant lesions EYES: normal, Conjunctiva are pink and non-injected, sclera clear OROPHARYNX:no exudate, no erythema and lips, buccal mucosa, and tongue normal  NECK: supple, thyroid normal size, non-tender, without nodularity LYMPH:  no palpable lymphadenopathy in the cervical, axillary or inguinal LUNGS: clear to auscultation and percussion with normal breathing effort HEART: regular rate & rhythm and no murmurs and no lower extremity edema ABDOMEN:abdomen soft, non-tender and normal bowel sounds Musculoskeletal:no cyanosis of digits and no clubbing . I do not detect any spinal tenderness. NEURO: alert & oriented x 3 with fluent speech, no focal motor/sensory deficits  LABORATORY DATA:  I have reviewed the data as listed    Component Value Date/Time   NA 139 11/21/2014 1021   NA 141 09/14/2014 1436   K 4.1 11/21/2014 1021   K 3.6* 09/14/2014 1436   CL 105 09/14/2014 1436   CL 107 03/10/2013 1014   CO2 21*  11/21/2014 1021   CO2 19 09/14/2014 1436   GLUCOSE 118 11/21/2014 1021   GLUCOSE 187* 09/14/2014 1436   GLUCOSE 111* 03/10/2013 1014   GLUCOSE 98 08/31/2006 1024   BUN 14.6 11/21/2014 1021   BUN 15 09/14/2014 1436   CREATININE 0.7 11/21/2014 1021   CREATININE 0.60 09/14/2014 1436   CALCIUM 8.6 11/21/2014 1021   CALCIUM 9.0 09/14/2014 1436   PROT 6.0* 11/21/2014 1021   PROT 6.6 09/11/2014 0336   ALBUMIN 3.5 11/21/2014 1021   ALBUMIN 3.4* 09/11/2014 0336   AST 16 11/21/2014 1021   AST 18 09/11/2014 0336   ALT 12 11/21/2014 1021   ALT 18 09/11/2014 0336   ALKPHOS 75 11/21/2014 1021   ALKPHOS 113 09/11/2014 0336   BILITOT 0.47 11/21/2014 1021   BILITOT 0.6 09/11/2014 0336   GFRNONAA >90 09/14/2014 1436   GFRAA >90 09/14/2014 1436    No results found for: SPEP,  UPEP  Lab Results  Component Value Date   WBC 4.3 11/21/2014   NEUTROABS 3.0 11/21/2014   HGB 14.4 11/21/2014   HCT 43.5 11/21/2014   MCV 93.5 11/21/2014   PLT 163 11/21/2014      Chemistry      Component Value Date/Time   NA 139 11/21/2014 1021   NA 141 09/14/2014 1436   K 4.1 11/21/2014 1021   K 3.6* 09/14/2014 1436   CL 105 09/14/2014 1436   CL 107 03/10/2013 1014   CO2 21* 11/21/2014 1021   CO2 19 09/14/2014 1436   BUN 14.6 11/21/2014 1021   BUN 15 09/14/2014 1436   CREATININE 0.7 11/21/2014 1021   CREATININE 0.60 09/14/2014 1436      Component Value Date/Time   CALCIUM 8.6 11/21/2014 1021   CALCIUM 9.0 09/14/2014 1436   ALKPHOS 75 11/21/2014 1021   ALKPHOS 113 09/11/2014 0336   AST 16 11/21/2014 1021   AST 18 09/11/2014 0336   ALT 12 11/21/2014 1021   ALT 18 09/11/2014 0336   BILITOT 0.47 11/21/2014 1021   BILITOT 0.6 09/11/2014 0336      ASSESSMENT & PLAN:  Multiple myeloma Clinically, she is tolerating treatment very well. Recent light chain study show she have responded very well to treatment.  I will see her back next month for toxicity review. In the meantime, she will proceed with treatment without dose adjustment. I plan to reduce dexamethasone dose further in the future.   Muscle cramp She complained of muscle cramps/spasm. I recommend a trial of muscle relaxant.   Thrombocytopenia due to drugs This is likely due to recent treatment. The patient denies recent history of bleeding such as epistaxis, hematuria or hematochezia. She is asymptomatic from the low platelet count. I will observe for now.  she does not require transfusion now. I will continue the chemotherapy at current dose without dosage adjustment.  If the thrombocytopenia gets progressive worse in the future, I might have to delay her treatment or adjust the chemotherapy dose.     Orders Placed This Encounter  Procedures  . SPEP & IFE with QIG    Standing Status: Future      Number of Occurrences:      Standing Expiration Date: 12/26/2015  . Kappa/lambda light chains    Standing Status: Future     Number of Occurrences:      Standing Expiration Date: 12/26/2015  . Beta 2 microglobulin, serum    Standing Status: Future     Number of Occurrences:  Standing Expiration Date: 12/26/2015   All questions were answered. The patient knows to call the clinic with any problems, questions or concerns. No barriers to learning was detected. I spent 25 minutes counseling the patient face to face. The total time spent in the appointment was 30 minutes and more than 50% was on counseling and review of test results     [facility], Carlon Chaloux, MD 11/22/2014 8:13 AM

## 2014-11-22 NOTE — Assessment & Plan Note (Signed)
Clinically, she is tolerating treatment very well. Recent light chain study show she have responded very well to treatment.  I will see her back next month for toxicity review. In the meantime, she will proceed with treatment without dose adjustment. I plan to reduce dexamethasone dose further in the future.

## 2014-12-05 ENCOUNTER — Other Ambulatory Visit (HOSPITAL_BASED_OUTPATIENT_CLINIC_OR_DEPARTMENT_OTHER): Payer: 59

## 2014-12-05 ENCOUNTER — Telehealth: Payer: Self-pay | Admitting: *Deleted

## 2014-12-05 ENCOUNTER — Ambulatory Visit (HOSPITAL_BASED_OUTPATIENT_CLINIC_OR_DEPARTMENT_OTHER): Payer: 59

## 2014-12-05 ENCOUNTER — Ambulatory Visit: Payer: 59

## 2014-12-05 DIAGNOSIS — C9000 Multiple myeloma not having achieved remission: Secondary | ICD-10-CM

## 2014-12-05 DIAGNOSIS — Z5112 Encounter for antineoplastic immunotherapy: Secondary | ICD-10-CM

## 2014-12-05 DIAGNOSIS — Z95828 Presence of other vascular implants and grafts: Secondary | ICD-10-CM

## 2014-12-05 LAB — COMPREHENSIVE METABOLIC PANEL (CC13)
ALBUMIN: 3.5 g/dL (ref 3.5–5.0)
ALT: 14 U/L (ref 0–55)
AST: 16 U/L (ref 5–34)
Alkaline Phosphatase: 69 U/L (ref 40–150)
Anion Gap: 10 mEq/L (ref 3–11)
BUN: 13.7 mg/dL (ref 7.0–26.0)
CHLORIDE: 109 meq/L (ref 98–109)
CO2: 21 mEq/L — ABNORMAL LOW (ref 22–29)
Calcium: 8.3 mg/dL — ABNORMAL LOW (ref 8.4–10.4)
Creatinine: 0.7 mg/dL (ref 0.6–1.1)
EGFR: >90 mL/min/{1.73_m2} (ref >90)
Glucose: 163 mg/dl — ABNORMAL HIGH (ref 70–140)
POTASSIUM: 3.8 meq/L (ref 3.5–5.1)
SODIUM: 140 meq/L (ref 136–145)
TOTAL PROTEIN: 6.1 g/dL — AB (ref 6.4–8.3)
Total Bilirubin: 0.41 mg/dL (ref 0.20–1.20)

## 2014-12-05 LAB — CBC WITH DIFFERENTIAL/PLATELET
BASO%: 1.3 % (ref 0.0–2.0)
Basophils Absolute: 0.1 10*3/uL (ref 0.0–0.1)
EOS ABS: 0.4 10*3/uL (ref 0.0–0.5)
EOS%: 8.3 % — ABNORMAL HIGH (ref 0.0–7.0)
HCT: 45.2 % (ref 34.8–46.6)
HGB: 14.8 g/dL (ref 11.6–15.9)
LYMPH#: 0.6 10*3/uL — AB (ref 0.9–3.3)
LYMPH%: 11.4 % — ABNORMAL LOW (ref 14.0–49.7)
MCH: 30.8 pg (ref 25.1–34.0)
MCHC: 32.7 g/dL (ref 31.5–36.0)
MCV: 94.2 fL (ref 79.5–101.0)
MONO#: 0.4 10*3/uL (ref 0.1–0.9)
MONO%: 7.7 % (ref 0.0–14.0)
NEUT%: 71.3 % (ref 38.4–76.8)
NEUTROS ABS: 3.8 10*3/uL (ref 1.5–6.5)
Platelets: 139 10*3/uL — ABNORMAL LOW (ref 145–400)
RBC: 4.8 10*6/uL (ref 3.70–5.45)
RDW: 16.3 % — AB (ref 11.2–14.5)
WBC: 5.3 10*3/uL (ref 3.9–10.3)

## 2014-12-05 MED ORDER — DIPHENHYDRAMINE HCL 25 MG PO CAPS
ORAL_CAPSULE | ORAL | Status: AC
  Filled 2014-12-05: qty 2

## 2014-12-05 MED ORDER — HEPARIN SOD (PORK) LOCK FLUSH 100 UNIT/ML IV SOLN
500.0000 [IU] | Freq: Once | INTRAVENOUS | Status: AC | PRN
Start: 2014-12-05 — End: 2014-12-05
  Administered 2014-12-05: 500 [IU]
  Filled 2014-12-05: qty 5

## 2014-12-05 MED ORDER — DEXAMETHASONE SODIUM PHOSPHATE 10 MG/ML IJ SOLN
INTRAMUSCULAR | Status: AC
  Filled 2014-12-05: qty 1

## 2014-12-05 MED ORDER — FAMOTIDINE IN NACL 20-0.9 MG/50ML-% IV SOLN
20.0000 mg | Freq: Once | INTRAVENOUS | Status: AC
  Administered 2014-12-05: 20 mg via INTRAVENOUS

## 2014-12-05 MED ORDER — ACETAMINOPHEN 325 MG PO TABS
ORAL_TABLET | ORAL | Status: AC
  Filled 2014-12-05: qty 2

## 2014-12-05 MED ORDER — SODIUM CHLORIDE 0.9 % IV SOLN
Freq: Once | Status: AC
  Administered 2014-12-05: 12:00:00 via INTRAVENOUS
  Filled 2014-12-05: qty 230

## 2014-12-05 MED ORDER — ACETAMINOPHEN 325 MG PO TABS
650.0000 mg | ORAL_TABLET | Freq: Once | ORAL | Status: AC
  Administered 2014-12-05: 650 mg via ORAL

## 2014-12-05 MED ORDER — DEXAMETHASONE SODIUM PHOSPHATE 10 MG/ML IJ SOLN
10.0000 mg | Freq: Once | INTRAMUSCULAR | Status: AC
  Administered 2014-12-05: 10 mg via INTRAVENOUS

## 2014-12-05 MED ORDER — SODIUM CHLORIDE 0.9 % IJ SOLN
10.0000 mL | INTRAMUSCULAR | Status: DC | PRN
  Filled 2014-12-05: qty 10

## 2014-12-05 MED ORDER — SODIUM CHLORIDE 0.9 % IV SOLN
INTRAVENOUS | Status: DC
  Administered 2014-12-05: 11:00:00 via INTRAVENOUS

## 2014-12-05 MED ORDER — FAMOTIDINE IN NACL 20-0.9 MG/50ML-% IV SOLN
INTRAVENOUS | Status: AC
  Filled 2014-12-05: qty 50

## 2014-12-05 MED ORDER — DIPHENHYDRAMINE HCL 25 MG PO CAPS
50.0000 mg | ORAL_CAPSULE | Freq: Once | ORAL | Status: AC
  Administered 2014-12-05: 50 mg via ORAL

## 2014-12-05 MED ORDER — LIDOCAINE-PRILOCAINE 2.5-2.5 % EX CREA: 1.0000 "application " | TOPICAL_CREAM | CUTANEOUS | Status: DC | PRN

## 2014-12-05 MED ORDER — VITAMIN D3 125 MCG (5000 UT) PO TABS: 5000.0000 mg | ORAL_TABLET | Freq: Every morning | ORAL | Status: DC

## 2014-12-05 MED ORDER — SODIUM CHLORIDE 0.9 % IJ SOLN
10.0000 mL | INTRAMUSCULAR | Status: DC | PRN
  Administered 2014-12-05 (×2): 10 mL via INTRAVENOUS
  Filled 2014-12-05: qty 10

## 2014-12-05 NOTE — Telephone Encounter (Signed)
Left message for patient to stop taking dexamethasone. RN will call in refill on EMLA and vitamin D

## 2014-12-05 NOTE — Patient Instructions (Signed)
[facility] Discharge Instructions for Patients Receiving Chemotherapy  Today you received the following chemotherapy agents ; Elotuzumab  To help prevent nausea and vomiting after your treatment, we encourage you to take your nausea medication as directed.    If you develop nausea and vomiting that is not controlled by your nausea medication, call the clinic.   BELOW ARE SYMPTOMS THAT SHOULD BE REPORTED IMMEDIATELY:  *FEVER GREATER THAN 100.5 F  *CHILLS WITH OR WITHOUT FEVER  NAUSEA AND VOMITING THAT IS NOT CONTROLLED WITH YOUR NAUSEA MEDICATION  *UNUSUAL SHORTNESS OF BREATH  *UNUSUAL BRUISING OR BLEEDING  TENDERNESS IN MOUTH AND THROAT WITH OR WITHOUT PRESENCE OF ULCERS  *URINARY PROBLEMS  *BOWEL PROBLEMS  UNUSUAL RASH Items with * indicate a potential emergency and should be followed up as soon as possible.  Feel free to call the clinic you have any questions or concerns. The clinic phone number is [phone].    

## 2014-12-05 NOTE — Patient Instructions (Signed)

## 2014-12-05 NOTE — Telephone Encounter (Signed)
-----   Message from Heath Lark, MD sent at 12/05/2014 12:35 PM EST ----- Regarding: FW: refill Tell her she can stop Dex. Please refill vitamin D and emla ----- Message -----    From: Cathlean Cower, RN    Sent: 12/05/2014  11:01 AM      To: Heath Lark, MD Subject: refill                                         Mrs. Mayfield needs refill on EMLA, Vitamin D and Dex sent to [facility] on  [address].  She says she is currently taking 4 tabs of Dex 4 mg weekly.   Thanks,

## 2014-12-07 LAB — KAPPA/LAMBDA LIGHT CHAINS
Kappa free light chain: 3.62 mg/dL — ABNORMAL HIGH (ref 0.33–1.94)
Kappa:Lambda Ratio: 3.62 — ABNORMAL HIGH (ref 0.26–1.65)
LAMBDA FREE LGHT CHN: 1 mg/dL (ref 0.57–2.63)

## 2014-12-07 LAB — SPEP & IFE WITH QIG
ALBUMIN ELP: 60.9 % (ref 55.8–66.1)
ALPHA-1-GLOBULIN: 5.9 % — AB (ref 2.9–4.9)
ALPHA-2-GLOBULIN: 14.3 % — AB (ref 7.1–11.8)
Beta 2: 3.3 % (ref 3.2–6.5)
Beta Globulin: 6.8 % (ref 4.7–7.2)
Gamma Globulin: 8.8 % — ABNORMAL LOW (ref 11.1–18.8)
IGG (IMMUNOGLOBIN G), SERUM: 547 mg/dL — AB (ref 690–1700)
IgA: 34 mg/dL — ABNORMAL LOW (ref 69–380)
IgM, Serum: 32 mg/dL — ABNORMAL LOW (ref 52–322)
Total Protein, Serum Electrophoresis: 5.8 g/dL — ABNORMAL LOW (ref 6.0–8.3)

## 2014-12-07 LAB — BETA 2 MICROGLOBULIN, SERUM: Beta-2 Microglobulin: 1.91 mg/L (ref <=2.51)

## 2014-12-12 ENCOUNTER — Other Ambulatory Visit: Payer: Self-pay | Admitting: *Deleted

## 2014-12-12 DIAGNOSIS — C9000 Multiple myeloma not having achieved remission: Secondary | ICD-10-CM

## 2014-12-12 MED ORDER — LENALIDOMIDE 25 MG PO CAPS: ORAL_CAPSULE | ORAL | Status: DC

## 2014-12-13 DIAGNOSIS — G893 Neoplasm related pain (acute) (chronic): Secondary | ICD-10-CM | POA: Diagnosis not present

## 2014-12-13 DIAGNOSIS — Z79891 Long term (current) use of opiate analgesic: Secondary | ICD-10-CM | POA: Diagnosis not present

## 2014-12-13 DIAGNOSIS — M545 Low back pain: Secondary | ICD-10-CM | POA: Diagnosis not present

## 2014-12-13 DIAGNOSIS — G894 Chronic pain syndrome: Secondary | ICD-10-CM | POA: Diagnosis not present

## 2014-12-19 ENCOUNTER — Ambulatory Visit: Payer: 59

## 2014-12-19 ENCOUNTER — Other Ambulatory Visit (HOSPITAL_BASED_OUTPATIENT_CLINIC_OR_DEPARTMENT_OTHER): Payer: 59

## 2014-12-19 ENCOUNTER — Encounter: Payer: Self-pay | Admitting: Hematology and Oncology

## 2014-12-19 ENCOUNTER — Other Ambulatory Visit: Payer: Self-pay

## 2014-12-19 ENCOUNTER — Other Ambulatory Visit (HOSPITAL_COMMUNITY): Payer: Self-pay | Admitting: Anesthesiology

## 2014-12-19 ENCOUNTER — Telehealth: Payer: Self-pay | Admitting: Hematology and Oncology

## 2014-12-19 ENCOUNTER — Ambulatory Visit (HOSPITAL_BASED_OUTPATIENT_CLINIC_OR_DEPARTMENT_OTHER): Payer: 59 | Admitting: Hematology and Oncology

## 2014-12-19 ENCOUNTER — Ambulatory Visit (HOSPITAL_COMMUNITY)
Admission: RE | Admit: 2014-12-19 | Discharge: 2014-12-19 | Disposition: A | Discharge status: Home / Self Care | Payer: Medicare Other | Source: Ambulatory Visit | Attending: Anesthesiology | Admitting: Anesthesiology

## 2014-12-19 VITALS — BP 140/97 | HR 105 | Temp 97.7°F | Resp 20 | Ht 64.0 in | Wt 123.6 lb

## 2014-12-19 DIAGNOSIS — T451X5A Adverse effect of antineoplastic and immunosuppressive drugs, initial encounter: Secondary | ICD-10-CM

## 2014-12-19 DIAGNOSIS — D701 Agranulocytosis secondary to cancer chemotherapy: Secondary | ICD-10-CM

## 2014-12-19 DIAGNOSIS — Z720 Tobacco use: Secondary | ICD-10-CM | POA: Diagnosis not present

## 2014-12-19 DIAGNOSIS — C9000 Multiple myeloma not having achieved remission: Secondary | ICD-10-CM

## 2014-12-19 DIAGNOSIS — R05 Cough: Secondary | ICD-10-CM

## 2014-12-19 DIAGNOSIS — M533 Sacrococcygeal disorders, not elsewhere classified: Secondary | ICD-10-CM | POA: Insufficient documentation

## 2014-12-19 DIAGNOSIS — M545 Low back pain: Secondary | ICD-10-CM | POA: Diagnosis not present

## 2014-12-19 DIAGNOSIS — F17200 Nicotine dependence, unspecified, uncomplicated: Secondary | ICD-10-CM

## 2014-12-19 DIAGNOSIS — D6959 Other secondary thrombocytopenia: Secondary | ICD-10-CM

## 2014-12-19 DIAGNOSIS — Z95828 Presence of other vascular implants and grafts: Secondary | ICD-10-CM

## 2014-12-19 DIAGNOSIS — T50905A Adverse effect of unspecified drugs, medicaments and biological substances, initial encounter: Secondary | ICD-10-CM

## 2014-12-19 DIAGNOSIS — R059 Cough, unspecified: Secondary | ICD-10-CM

## 2014-12-19 LAB — CBC WITH DIFFERENTIAL/PLATELET
BASO%: 1.7 % (ref 0.0–2.0)
Basophils Absolute: 0.1 10*3/uL (ref 0.0–0.1)
EOS%: 1.7 % (ref 0.0–7.0)
Eosinophils Absolute: 0.1 10*3/uL (ref 0.0–0.5)
HCT: 43.4 % (ref 34.8–46.6)
HEMOGLOBIN: 15 g/dL (ref 11.6–15.9)
LYMPH%: 11.9 % — ABNORMAL LOW (ref 14.0–49.7)
MCH: 32.1 pg (ref 25.1–34.0)
MCHC: 34.6 g/dL (ref 31.5–36.0)
MCV: 92.7 fL (ref 79.5–101.0)
MONO#: 0.2 10*3/uL (ref 0.1–0.9)
MONO%: 5.4 % (ref 0.0–14.0)
NEUT#: 2.3 10*3/uL (ref 1.5–6.5)
NEUT%: 79.3 % — ABNORMAL HIGH (ref 38.4–76.8)
Platelets: 114 10*3/uL — ABNORMAL LOW (ref 145–400)
RBC: 4.68 10*6/uL (ref 3.70–5.45)
RDW: 14.8 % — AB (ref 11.2–14.5)
WBC: 2.9 10*3/uL — ABNORMAL LOW (ref 3.9–10.3)
lymph#: 0.4 10*3/uL — ABNORMAL LOW (ref 0.9–3.3)

## 2014-12-19 LAB — COMPREHENSIVE METABOLIC PANEL (CC13)
ALT: 14 U/L (ref 0–55)
ANION GAP: 9 meq/L (ref 3–11)
AST: 23 U/L (ref 5–34)
Albumin: 3.5 g/dL (ref 3.5–5.0)
Alkaline Phosphatase: 69 U/L (ref 40–150)
BILIRUBIN TOTAL: 0.43 mg/dL (ref 0.20–1.20)
BUN: 13.1 mg/dL (ref 7.0–26.0)
CHLORIDE: 106 meq/L (ref 98–109)
CO2: 21 mEq/L — ABNORMAL LOW (ref 22–29)
Calcium: 9 mg/dL (ref 8.4–10.4)
Creatinine: 0.7 mg/dL (ref 0.6–1.1)
EGFR: >90 mL/min/{1.73_m2} (ref >90)
Glucose: 158 mg/dl — ABNORMAL HIGH (ref 70–140)
Potassium: 3.9 mEq/L (ref 3.5–5.1)
Sodium: 136 mEq/L (ref 136–145)
TOTAL PROTEIN: 6.3 g/dL — AB (ref 6.4–8.3)

## 2014-12-19 MED ORDER — SODIUM CHLORIDE 0.9 % IJ SOLN
10.0000 mL | INTRAMUSCULAR | Status: DC | PRN
  Administered 2014-12-19: 10 mL via INTRAVENOUS
  Filled 2014-12-19: qty 10

## 2014-12-19 MED ORDER — HEPARIN SOD (PORK) LOCK FLUSH 100 UNIT/ML IV SOLN
500.0000 [IU] | Freq: Once | INTRAVENOUS | Status: AC
  Administered 2014-12-19: 500 [IU] via INTRAVENOUS
  Filled 2014-12-19: qty 5

## 2014-12-19 MED ORDER — AMOXICILLIN-POT CLAVULANATE 875-125 MG PO TABS: 1.0000 | ORAL_TABLET | Freq: Two times a day (BID) | ORAL | Status: DC

## 2014-12-19 NOTE — Telephone Encounter (Signed)
gv adn printed appt sched and avs fo rpt for Feb adn March..sed added tx.... °

## 2014-12-19 NOTE — Patient Instructions (Signed)

## 2014-12-20 DIAGNOSIS — T451X5A Adverse effect of antineoplastic and immunosuppressive drugs, initial encounter: Secondary | ICD-10-CM

## 2014-12-20 DIAGNOSIS — D701 Agranulocytosis secondary to cancer chemotherapy: Secondary | ICD-10-CM | POA: Insufficient documentation

## 2014-12-20 NOTE — Progress Notes (Signed)
[facility] OFFICE PROGRESS NOTE  Patient Care Team: Marin Olp, MD as PCP - General (Family Medicine) Jeanann Lewandowsky, MD as Consulting Physician (Medical Oncology) Marin Olp, MD as Consulting Physician (Family Medicine) Heath Lark, MD as Consulting Physician (Hematology and Oncology)  SUMMARY OF ONCOLOGIC HISTORY: Oncology History   Multiple myeloma, kappa light chain disease, Durie-Salmon stage III     Multiple myeloma   07/16/2006 Bone Marrow Biopsy BM biopsy is non-diagnostic   09/21/2006 Procedure L5 vertebral biopsy 5% plasma cell   10/25/2006 Bone Marrow Biopsy BM biopsy is hypercellular with 5% plasma cell   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   01/03/2007 -  Radiation Therapy Approximate date only, received RT for plasmacytoma followed by surgery   09/14/2007 Initial Diagnosis MULTIPLE  MYELOMA   10/05/2007 Bone Marrow Biopsy Bm biopsy was negative   06/18/2008 Bone Marrow Biopsy BM biopsy was negative   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 [facility]   04/13/2011 Relapse/Recurrence Disease relapse   04/14/2011 Bone Marrow Biopsy Bm biopsy showed 2 % plasma cell   04/27/2011 Relapse/Recurrence Disease relapse, treated with Velcade/Cytoxan/Dex   09/07/2011 - 07/28/2013 Chemotherapy She has been receiving Velcade   12/27/2011 Bone Marrow Transplant 2nd transplant at [facility]   07/28/2013 Relapse/Recurrence Chemo is stopped due to progression of disease   08/29/2013 Imaging PEt/CT showed recurrence of disease with new lesion on her rib with compression fracture   09/13/2013 Bone Marrow Biopsy BM biopsy is hypercellular with 6% plasma cell   10/10/2013 - 10/20/2013 Radiation Therapy Started on palliative XRT for rib pain   11/27/2013 - 04/06/2014 Chemotherapy The patient starts chemotherapy with Carfilzomib   07/19/2014 Imaging Repeat bloodwork and PET/CT scan shows significant disease progression.   08/02/2014 -  Chemotherapy She  enrolled in clinical trial using combination therapy with Revlimid, dexamethasone and Elotuzumab    INTERVAL HISTORY: Please see below for problem oriented charting. She is seen prior to treatment. Since the last saw her, she complained of fever, productive cough for the last 2 days. Denies chills. The patient continues to smoke. She has not seen a dentist for poor dentition and obtain clearance prior to Zometa. She denies new bone pain.  REVIEW OF SYSTEMS:   Constitutional: Denies fevers, chills or abnormal weight loss Eyes: Denies blurriness of vision Ears, nose, mouth, throat, and face: Denies mucositis or sore throat Cardiovascular: Denies palpitation, chest discomfort or lower extremity swelling Gastrointestinal:  Denies nausea, heartburn or change in bowel habits Skin: Denies abnormal skin rashes Lymphatics: Denies new lymphadenopathy or easy bruising Neurological:Denies numbness, tingling or new weaknesses Behavioral/Psych: Mood is stable, no new changes  All other systems were reviewed with the patient and are negative.  I have reviewed the past medical history, past surgical history, social history and family history with the patient and they are unchanged from previous note.  ALLERGIES:  is allergic to codeine; ibuprofen; and albuterol.  MEDICATIONS:  Current Outpatient Prescriptions  Medication Sig Dispense Refill  . Alum & Mag Hydroxide-Simeth (MAGIC MOUTHWASH) SOLN Take 10 mLs by mouth 4 (four) times daily as needed for mouth pain. Swish and spit  Or  Swish and swallow. 300 mL 2  . aspirin 81 MG tablet Take 81 mg by mouth daily.    . Cholecalciferol (VITAMIN D3) 5000 UNITS TABS Take 5,000 mg by mouth every morning. 30 tablet 0  . cyclobenzaprine (FLEXERIL) 10 MG tablet Take 1 tablet (10 mg total) by mouth 3 (three) times  daily as needed for muscle spasms. 60 tablet 0  . dextromethorphan-guaiFENesin (MUCINEX DM) 30-600 MG per 12 hr tablet Take 1 tablet by mouth 2 (two)  times daily as needed for cough (cough).    Marland Kitchen ipratropium (ATROVENT HFA) 17 MCG/ACT inhaler Inhale 2 puffs into the lungs every 4 (four) hours as needed for wheezing. 1 Inhaler 12  . lenalidomide (REVLIMID) 25 MG capsule TAKE ONE CAPSULE BY MOUTH EVERY DAY FOR 21 DAYS THEN 7 DAYS OFF. 21 capsule 0  . lidocaine-prilocaine (EMLA) cream Apply 1 application topically as needed. 30 g 0  . losartan (COZAAR) 50 MG tablet Take 0.5-1 tablets (25-50 mg total) by mouth daily. 30 tablet 3  . magnesium hydroxide (MILK OF MAGNESIA) 400 MG/5ML suspension Take 30 mLs by mouth daily as needed for mild constipation (constipation).     . morphine (MS CONTIN) 60 MG 12 hr tablet Take 60 mg by mouth every 12 (twelve) hours.    Marland Kitchen morphine (MSIR) 30 MG tablet Take 30 mg by mouth every 4 (four) hours as needed for severe pain (pain).     . prochlorperazine (COMPAZINE) 10 MG tablet Take 1 tablet (10 mg total) by mouth every 6 (six) hours as needed for nausea. 60 tablet 3  . amoxicillin-clavulanate (AUGMENTIN) 875-125 MG per tablet Take 1 tablet by mouth 2 (two) times daily. 14 tablet 0  . [DISCONTINUED] amLODipine (NORVASC) 5 MG tablet Take 1 tablet (5 mg total) by mouth daily. 90 tablet 3  . [DISCONTINUED] gabapentin (NEURONTIN) 600 MG tablet Take 600 mg by mouth 3 (three) times daily.       No current facility-administered medications for this visit.   Facility-Administered Medications Ordered in Other Visits  Medication Dose Route Frequency Provider Last Rate Last Dose  . sodium chloride 0.9 % injection 10 mL  10 mL Intravenous PRN Heath Lark, MD   10 mL at 02/26/14 1425  . sodium chloride 0.9 % injection 10 mL  10 mL Intravenous PRN Chauncey Cruel, MD   10 mL at 04/05/14 1333    PHYSICAL EXAMINATION: ECOG PERFORMANCE STATUS: 0 - Asymptomatic  Filed Vitals:   12/19/14 1033  BP: 140/97  Pulse: 105  Temp: 97.7 F (36.5 C)  Resp: 20   Filed Weights   12/19/14 1033  Weight: 123 lb 9.6 oz (56.065 kg)     GENERAL:alert, no distress and comfortable SKIN: skin color, texture, turgor are normal, no rashes or significant lesions EYES: normal, Conjunctiva are pink and non-injected, sclera clear OROPHARYNX:no exudate, no erythema and lips, buccal mucosa, and tongue normal  NECK: supple, thyroid normal size, non-tender, without nodularity LYMPH:  no palpable lymphadenopathy in the cervical, axillary or inguinal LUNGS: clear to auscultation and percussion with normal breathing effort HEART: regular rate & rhythm and no murmurs and no lower extremity edema ABDOMEN:abdomen soft, non-tender and normal bowel sounds Musculoskeletal:no cyanosis of digits and no clubbing  NEURO: alert & oriented x 3 with fluent speech, no focal motor/sensory deficits  LABORATORY DATA:  I have reviewed the data as listed    Component Value Date/Time   NA 136 12/19/2014 0949   NA 141 09/14/2014 1436   K 3.9 12/19/2014 0949   K 3.6* 09/14/2014 1436   CL 105 09/14/2014 1436   CL 107 03/10/2013 1014   CO2 21* 12/19/2014 0949   CO2 19 09/14/2014 1436   GLUCOSE 158* 12/19/2014 0949   GLUCOSE 187* 09/14/2014 1436   GLUCOSE 111* 03/10/2013 1014   GLUCOSE  98 08/31/2006 1024   BUN 13.1 12/19/2014 0949   BUN 15 09/14/2014 1436   CREATININE 0.7 12/19/2014 0949   CREATININE 0.60 09/14/2014 1436   CALCIUM 9.0 12/19/2014 0949   CALCIUM 9.0 09/14/2014 1436   PROT 6.3* 12/19/2014 0949   PROT 6.6 09/11/2014 0336   ALBUMIN 3.5 12/19/2014 0949   ALBUMIN 3.4* 09/11/2014 0336   AST 23 12/19/2014 0949   AST 18 09/11/2014 0336   ALT 14 12/19/2014 0949   ALT 18 09/11/2014 0336   ALKPHOS 69 12/19/2014 0949   ALKPHOS 113 09/11/2014 0336   BILITOT 0.43 12/19/2014 0949   BILITOT 0.6 09/11/2014 0336   GFRNONAA >90 09/14/2014 1436   GFRAA >90 09/14/2014 1436    No results found for: SPEP, UPEP  Lab Results  Component Value Date   WBC 2.9* 12/19/2014   NEUTROABS 2.3 12/19/2014   HGB 15.0 12/19/2014   HCT 43.4  12/19/2014   MCV 92.7 12/19/2014   PLT 114* 12/19/2014      Chemistry      Component Value Date/Time   NA 136 12/19/2014 0949   NA 141 09/14/2014 1436   K 3.9 12/19/2014 0949   K 3.6* 09/14/2014 1436   CL 105 09/14/2014 1436   CL 107 03/10/2013 1014   CO2 21* 12/19/2014 0949   CO2 19 09/14/2014 1436   BUN 13.1 12/19/2014 0949   BUN 15 09/14/2014 1436   CREATININE 0.7 12/19/2014 0949   CREATININE 0.60 09/14/2014 1436      Component Value Date/Time   CALCIUM 9.0 12/19/2014 0949   CALCIUM 9.0 09/14/2014 1436   ALKPHOS 69 12/19/2014 0949   ALKPHOS 113 09/11/2014 0336   AST 23 12/19/2014 0949   AST 18 09/11/2014 0336   ALT 14 12/19/2014 0949   ALT 18 09/11/2014 0336   BILITOT 0.43 12/19/2014 0949   BILITOT 0.6 09/11/2014 0336       RADIOGRAPHIC STUDIES: I have personally reviewed the radiological images as listed and agreed with the findings in the report. Dg Lumbar Spine Complete  12/19/2014   CLINICAL DATA:  Low back pain for 3 days.  No new injury.  EXAM: LUMBAR SPINE - COMPLETE 4+ VIEW  COMPARISON:  [facility] survey 05/24/2014.  FINDINGS: No new or acute compression fractures identified. Vertebral augmentation has been performed at L5. RIGHT-sided sacral plasty has also been performed. Large stool burden is incidentally noted. Dextroconvex curve of the lumbar spine appears similar to the prior exam.  IMPRESSION: Changes of RIGHT sacral and L5 vertebral augmentation. No acute abnormality.   Electronically Signed   By: Dereck Ligas M.D.   On: 12/19/2014 13:52   Dg Sacrum/coccyx  12/19/2014   CLINICAL DATA:  Coccygeal pain. No known injury. Initial evaluation.  EXAM: SACRUM AND COCCYX - 2+ VIEW  COMPARISON:  None.  FINDINGS: Methylmethacrylate is noted of the central right portion of the L5 vertebral body and epidural/ paraspinal region. Methylmethacrylate appears to be in the right sacral ala. No fracture. Diffuse degenerative change.  IMPRESSION: No acute abnormality.  No  evidence of fracture.   Electronically Signed   By: Marcello Moores  Register   On: 12/19/2014 13:50     ASSESSMENT & PLAN:  Multiple myeloma Clinically, she is tolerating treatment very well. Recent light chain study show she have responded very well to treatment.  Due to congestion and possible upper respiratory tract infection, I recommend holding treatment this week and reschedule to following week. I will see her back next month  for toxicity review. In the meantime, she will proceed with treatment without dose adjustment. I plan to reduce dexamethasone dose further in the future.     Cough Clinically, she appears to have COPD exacerbation. I will hold treatment this week. I will give her course of antibiotic therapy along with dexamethasone. The patient is okay for [place] to hold treatment. I felt that it is okay for her to continue taking Revlimid.   Smoker I spent some time counseling the patient the importance of tobacco cessation. She is currently not interested to quit now.    Thrombocytopenia due to drugs This is likely due to recent treatment. The patient denies recent history of bleeding such as epistaxis, hematuria or hematochezia. She is asymptomatic from the low platelet count. I will observe for now.  she does not require transfusion now.     Leukopenia due to antineoplastic chemotherapy This is likely due to recent treatment. I will hold treatment and proceed with antibiotic therapy as above.    No orders of the defined types were placed in this encounter.   All questions were answered. The patient knows to call the clinic with any problems, questions or concerns. No barriers to learning was detected. I spent 30 minutes counseling the patient face to face. The total time spent in the appointment was 40 minutes and more than 50% was on counseling and review of test results     [facility], [place], MD 12/20/2014 1:01 PM

## 2014-12-20 NOTE — Assessment & Plan Note (Signed)
Clinically, she appears to have COPD exacerbation. I will hold treatment this week. I will give her course of antibiotic therapy along with dexamethasone. The patient is okay for [place] to hold treatment. I felt that it is okay for her to continue taking Revlimid.

## 2014-12-20 NOTE — Assessment & Plan Note (Signed)
Clinically, she is tolerating treatment very well. Recent light chain study show she have responded very well to treatment.  Due to congestion and possible upper respiratory tract infection, I recommend holding treatment this week and reschedule to following week. I will see her back next month for toxicity review. In the meantime, she will proceed with treatment without dose adjustment. I plan to reduce dexamethasone dose further in the future.

## 2014-12-20 NOTE — Assessment & Plan Note (Signed)
I spent some time counseling the patient the importance of tobacco cessation. She is currently not interested to quit now. 

## 2014-12-20 NOTE — Assessment & Plan Note (Signed)
This is likely due to recent treatment. I will hold treatment and proceed with antibiotic therapy as above.

## 2014-12-20 NOTE — Assessment & Plan Note (Signed)
This is likely due to recent treatment. The patient denies recent history of bleeding such as epistaxis, hematuria or hematochezia. She is asymptomatic from the low platelet count. I will observe for now.  she does not require transfusion now.  

## 2014-12-26 ENCOUNTER — Ambulatory Visit (HOSPITAL_BASED_OUTPATIENT_CLINIC_OR_DEPARTMENT_OTHER): Payer: PRIVATE HEALTH INSURANCE

## 2014-12-26 DIAGNOSIS — C9000 Multiple myeloma not having achieved remission: Secondary | ICD-10-CM

## 2014-12-26 DIAGNOSIS — Z5112 Encounter for antineoplastic immunotherapy: Secondary | ICD-10-CM

## 2014-12-26 MED ORDER — ACETAMINOPHEN 325 MG PO TABS
650.0000 mg | ORAL_TABLET | Freq: Once | ORAL | Status: AC
  Administered 2014-12-26: 650 mg via ORAL

## 2014-12-26 MED ORDER — ACETAMINOPHEN 325 MG PO TABS
ORAL_TABLET | ORAL | Status: AC
  Filled 2014-12-26: qty 2

## 2014-12-26 MED ORDER — DEXAMETHASONE SODIUM PHOSPHATE 10 MG/ML IJ SOLN
INTRAMUSCULAR | Status: AC
  Filled 2014-12-26: qty 1

## 2014-12-26 MED ORDER — FAMOTIDINE IN NACL 20-0.9 MG/50ML-% IV SOLN
20.0000 mg | Freq: Once | INTRAVENOUS | Status: AC
  Administered 2014-12-26: 20 mg via INTRAVENOUS

## 2014-12-26 MED ORDER — HEPARIN SOD (PORK) LOCK FLUSH 100 UNIT/ML IV SOLN
500.0000 [IU] | Freq: Once | INTRAVENOUS | Status: AC | PRN
  Administered 2014-12-26: 500 [IU]
  Filled 2014-12-26: qty 5

## 2014-12-26 MED ORDER — SODIUM CHLORIDE 0.9 % IV SOLN
Freq: Once | Status: AC
  Administered 2014-12-26: 16:00:00 via INTRAVENOUS
  Filled 2014-12-26: qty 230

## 2014-12-26 MED ORDER — DIPHENHYDRAMINE HCL 25 MG PO CAPS
50.0000 mg | ORAL_CAPSULE | Freq: Once | ORAL | Status: AC
  Administered 2014-12-26: 50 mg via ORAL

## 2014-12-26 MED ORDER — DEXAMETHASONE SODIUM PHOSPHATE 10 MG/ML IJ SOLN
10.0000 mg | Freq: Once | INTRAMUSCULAR | Status: AC
  Administered 2014-12-26: 10 mg via INTRAVENOUS

## 2014-12-26 MED ORDER — SODIUM CHLORIDE 0.9 % IV SOLN
INTRAVENOUS | Status: DC
  Administered 2014-12-26: 15:00:00 via INTRAVENOUS

## 2014-12-26 MED ORDER — SODIUM CHLORIDE 0.9 % IJ SOLN
10.0000 mL | INTRAMUSCULAR | Status: DC | PRN
  Administered 2014-12-26: 10 mL
  Filled 2014-12-26: qty 10

## 2014-12-26 MED ORDER — DIPHENHYDRAMINE HCL 25 MG PO CAPS
ORAL_CAPSULE | ORAL | Status: AC
  Filled 2014-12-26: qty 2

## 2014-12-26 MED ORDER — FAMOTIDINE IN NACL 20-0.9 MG/50ML-% IV SOLN
INTRAVENOUS | Status: AC
  Filled 2014-12-26: qty 50

## 2014-12-26 NOTE — Patient Instructions (Signed)
[facility] Discharge Instructions for Patients Receiving Chemotherapy  Today you received the following chemotherapy agents:  Elotuzumab  To help prevent nausea and vomiting after your treatment, we encourage you to take your nausea medication as ordered per MD.   If you develop nausea and vomiting that is not controlled by your nausea medication, call the clinic.   BELOW ARE SYMPTOMS THAT SHOULD BE REPORTED IMMEDIATELY:  *FEVER GREATER THAN 100.5 F  *CHILLS WITH OR WITHOUT FEVER  NAUSEA AND VOMITING THAT IS NOT CONTROLLED WITH YOUR NAUSEA MEDICATION  *UNUSUAL SHORTNESS OF BREATH  *UNUSUAL BRUISING OR BLEEDING  TENDERNESS IN MOUTH AND THROAT WITH OR WITHOUT PRESENCE OF ULCERS  *URINARY PROBLEMS  *BOWEL PROBLEMS  UNUSUAL RASH Items with * indicate a potential emergency and should be followed up as soon as possible.  Feel free to call the clinic you have any questions or concerns. The clinic phone number is [phone].    

## 2014-12-26 NOTE — Progress Notes (Signed)
OK to treat with labs from 12/19/14 per Dr. Alvy Bimler

## 2014-12-28 ENCOUNTER — Emergency Department (HOSPITAL_COMMUNITY): Payer: Medicare Other

## 2014-12-28 ENCOUNTER — Other Ambulatory Visit: Payer: Self-pay

## 2014-12-28 ENCOUNTER — Emergency Department (HOSPITAL_COMMUNITY)
Admission: EM | Admit: 2014-12-28 | Discharge: 2014-12-29 | Disposition: A | Discharge status: Home / Self Care | Payer: Medicare Other | Attending: Emergency Medicine | Admitting: Emergency Medicine

## 2014-12-28 ENCOUNTER — Telehealth: Payer: Self-pay | Admitting: Internal Medicine

## 2014-12-28 ENCOUNTER — Encounter (HOSPITAL_COMMUNITY): Payer: Self-pay

## 2014-12-28 DIAGNOSIS — G8929 Other chronic pain: Secondary | ICD-10-CM | POA: Diagnosis not present

## 2014-12-28 DIAGNOSIS — R0602 Shortness of breath: Secondary | ICD-10-CM | POA: Diagnosis not present

## 2014-12-28 DIAGNOSIS — I10 Essential (primary) hypertension: Secondary | ICD-10-CM | POA: Insufficient documentation

## 2014-12-28 DIAGNOSIS — Z8639 Personal history of other endocrine, nutritional and metabolic disease: Secondary | ICD-10-CM | POA: Insufficient documentation

## 2014-12-28 DIAGNOSIS — R079 Chest pain, unspecified: Secondary | ICD-10-CM | POA: Diagnosis not present

## 2014-12-28 DIAGNOSIS — Z8719 Personal history of other diseases of the digestive system: Secondary | ICD-10-CM | POA: Insufficient documentation

## 2014-12-28 DIAGNOSIS — F329 Major depressive disorder, single episode, unspecified: Secondary | ICD-10-CM | POA: Diagnosis not present

## 2014-12-28 DIAGNOSIS — Z8619 Personal history of other infectious and parasitic diseases: Secondary | ICD-10-CM | POA: Diagnosis not present

## 2014-12-28 DIAGNOSIS — J449 Chronic obstructive pulmonary disease, unspecified: Secondary | ICD-10-CM

## 2014-12-28 DIAGNOSIS — R05 Cough: Secondary | ICD-10-CM | POA: Diagnosis not present

## 2014-12-28 DIAGNOSIS — M1990 Unspecified osteoarthritis, unspecified site: Secondary | ICD-10-CM | POA: Diagnosis not present

## 2014-12-28 DIAGNOSIS — F419 Anxiety disorder, unspecified: Secondary | ICD-10-CM | POA: Diagnosis not present

## 2014-12-28 DIAGNOSIS — F17210 Nicotine dependence, cigarettes, uncomplicated: Secondary | ICD-10-CM | POA: Diagnosis not present

## 2014-12-28 DIAGNOSIS — J441 Chronic obstructive pulmonary disease with (acute) exacerbation: Secondary | ICD-10-CM | POA: Insufficient documentation

## 2014-12-28 LAB — CBC
HEMATOCRIT: 39.8 % (ref 36.0–46.0)
Hemoglobin: 13.6 g/dL (ref 12.0–15.0)
MCH: 32.6 pg (ref 26.0–34.0)
MCHC: 34.2 g/dL (ref 30.0–36.0)
MCV: 95.4 fL (ref 78.0–100.0)
PLATELETS: 160 10*3/uL (ref 150–400)
RBC: 4.17 MIL/uL (ref 3.87–5.11)
RDW: 14.9 % (ref 11.5–15.5)
WBC: 5 10*3/uL (ref 4.0–10.5)

## 2014-12-28 LAB — BASIC METABOLIC PANEL
Anion gap: 7 (ref 5–15)
BUN: 14 mg/dL (ref 6–23)
CO2: 24 mmol/L (ref 19–32)
CREATININE: 0.67 mg/dL (ref 0.50–1.10)
Calcium: 8.8 mg/dL (ref 8.4–10.5)
Chloride: 109 mmol/L (ref 96–112)
GFR calc Af Amer: >90 mL/min (ref >90)
GFR calc non Af Amer: >90 mL/min (ref >90)
Glucose, Bld: 144 mg/dL — ABNORMAL HIGH (ref 70–99)
Potassium: 3.8 mmol/L (ref 3.5–5.1)
Sodium: 140 mmol/L (ref 135–145)

## 2014-12-28 MED ORDER — ALBUTEROL SULFATE (2.5 MG/3ML) 0.083% IN NEBU
2.5000 mg | INHALATION_SOLUTION | Freq: Once | RESPIRATORY_TRACT | Status: AC
  Administered 2014-12-28: 2.5 mg via RESPIRATORY_TRACT
  Filled 2014-12-28: qty 3

## 2014-12-28 MED ORDER — LEVOFLOXACIN 500 MG PO TABS: 500.0000 mg | ORAL_TABLET | Freq: Every day | ORAL | Status: DC

## 2014-12-28 MED ORDER — DIPHENHYDRAMINE HCL 50 MG/ML IJ SOLN
12.5000 mg | Freq: Once | INTRAMUSCULAR | Status: AC
  Administered 2014-12-28: 12.5 mg via INTRAVENOUS
  Filled 2014-12-28: qty 1

## 2014-12-28 MED ORDER — METHYLPREDNISOLONE SODIUM SUCC 125 MG IJ SOLR
125.0000 mg | Freq: Once | INTRAMUSCULAR | Status: AC
  Administered 2014-12-28: 125 mg via INTRAVENOUS
  Filled 2014-12-28: qty 2

## 2014-12-28 MED ORDER — PREDNISONE 10 MG PO TABS: ORAL_TABLET | ORAL | Status: DC

## 2014-12-28 MED ORDER — ALBUTEROL (5 MG/ML) CONTINUOUS INHALATION SOLN
10.0000 mg/h | INHALATION_SOLUTION | Freq: Once | RESPIRATORY_TRACT | Status: AC
  Administered 2014-12-28: 10 mg/h via RESPIRATORY_TRACT
  Filled 2014-12-28: qty 20

## 2014-12-28 MED ORDER — IPRATROPIUM BROMIDE 0.02 % IN SOLN
0.5000 mg | Freq: Once | RESPIRATORY_TRACT | Status: AC
  Administered 2014-12-28: 0.5 mg via RESPIRATORY_TRACT
  Filled 2014-12-28: qty 2.5

## 2014-12-28 NOTE — ED Notes (Signed)
Patient transported to X-ray 

## 2014-12-28 NOTE — Telephone Encounter (Signed)
Spoke with pt, c/o prod cough with brown mucus, increased sob with exertion since last Wednesday.  No fever, sinus congestion.  Oncologist gave pt amoxicilin- finished yesterday.  Amox didn't help pt's symptoms.    [facility] [address].    MW please advise on recs.  Thanks!

## 2014-12-28 NOTE — Telephone Encounter (Signed)
Prednisone 10 mg take  4 each am x 2 days,   2 each am x 2 days,  1 each am x 2 days and stop  levaquin 500 mg daily x 7 dys then ov if not better

## 2014-12-28 NOTE — Telephone Encounter (Signed)
Pt is aware of MW's recs. Rx's will be sent in.

## 2014-12-28 NOTE — ED Notes (Signed)
Pt c/o SOB, upper back pain, chest congestion, and productive cough x 12 days.  Pain score 6/10.  Pt reports her Oncologist started her on antibiotic amoxicillin last week w/o relief.  Sts MD called in levaquin and prednisone today.  Last chemo treatment 2/24.

## 2014-12-28 NOTE — ED Provider Notes (Signed)
CSN: [id number]     Arrival date & time 12/28/14  1845 History   First MD Initiated Contact with Patient 12/28/14 1856     Chief Complaint  Patient presents with  . Chemo Card   . Shortness of Breath   Patient is a 64 y.o. female presenting with shortness of breath. The history is provided by the patient.  Shortness of Breath Severity:  Moderate Onset quality:  Gradual Duration:  2 weeks Timing:  Constant Progression:  Worsening Chronicity:  New Relieved by:  Nothing Worsened by:  Activity Ineffective treatments: She spoke with her doctor and had been put on antibiotics without improvement. Associated symptoms: cough   Associated symptoms: no abdominal pain, no fever, no sputum production and no vomiting   Risk factors: hx of cancer   Pt is getting chemo for Multiple myeloma.  PCP called in levaquin and prednisone today.  Past Medical History  Diagnosis Date  . Depression   . Hypertension   . Hepatitis C     genotype 1  . Diverticulosis of colon   . Multiple myeloma     kappa light chain, s/p high-dose chemo/stem cell tx - [facility]  . Hx of adenomatous colonic polyps 2007  . Anxiety   . Osteoarthritis   . Sleep apnea   . Hyperlipidemia   . GERD with stricture   . External hemorrhoids   . Hepatitis B virus infection   . IBS (irritable bowel syndrome)   . Helicobacter pylori gastritis 2001    ? if treated  . Sciatic nerve pain     with Dr. Earle Gell  . Chronic low back pain   . Unspecified vitamin D deficiency 09/22/2013  . Allergy   . Fever 11/28/2013  . Dehydration 11/28/2013  . Nausea alone 01/22/2014  . Cough 08/02/2014  . DIVERTICULOSIS, COLON 08/31/2006  . DEPRESSION 08/31/2006    Qualifier: Diagnosis of  By: [name], [name]    . Muscle cramp 11/21/2014   Past Surgical History  Procedure Laterality Date  . Abdominal hysterectomy    . Oophorectomy    . Dilation and curettage of uterus    . Bone marrow transplant      2009, 2013 [facility]  . Appendectomy    .  Colonoscopy w/ polypectomy  08/2006    1-2 adenomas, 6 polyps total, diverticulosis and external hemorrhoids  . Upper gastrointestinal endoscopy  08/2003    esophageal stricture dilation, hiatal hernia, gastrritis  . Cholecystectomy    . Tonsillectomy and adenoidectomy     Family History  Problem Relation Age of Onset  . Alcohol abuse    . Lung cancer Father     smoked  . Leukemia Mother   . Cirrhosis Sister   . Colon cancer Neg Hx   . Lung cancer Sister     smoked   History  Substance Use Topics  . Smoking status: Current Every Day Smoker -- 0.25 packs/day for 44 years    Types: Cigarettes  . Smokeless tobacco: Never Used  . Alcohol Use: No   OB History    No data available     Review of Systems  Constitutional: Negative for fever.  Respiratory: Positive for cough and shortness of breath. Negative for sputum production.   Gastrointestinal: Negative for vomiting and abdominal pain.      Allergies  Codeine; Ibuprofen; and Albuterol  Home Medications   Prior to Admission medications   Medication Sig Start Date End Date Taking? Authorizing Provider  Alum & Mag Hydroxide-Simeth (MAGIC MOUTHWASH) SOLN Take 10 mLs by mouth 4 (four) times daily as needed for mouth pain. Swish and spit  Or  Swish and swallow. 03/29/14  Yes Heath Lark, MD  aspirin 81 MG tablet Take 81 mg by mouth daily.   Yes Historical Provider, MD  Cholecalciferol (VITAMIN D3) 5000 UNITS TABS Take 5,000 mg by mouth every morning. 12/05/14  Yes Heath Lark, MD  ipratropium (ATROVENT HFA) 17 MCG/ACT inhaler Inhale 2 puffs into the lungs every 4 (four) hours as needed for wheezing. 09/13/14  Yes Heath Lark, MD  lenalidomide (REVLIMID) 25 MG capsule TAKE ONE CAPSULE BY MOUTH EVERY DAY FOR 21 DAYS THEN 7 DAYS OFF. 12/12/14  Yes Wyatt Portela, MD  lidocaine-prilocaine (EMLA) cream Apply 1 application topically as needed. 12/05/14  Yes Heath Lark, MD  losartan (COZAAR) 50 MG tablet Take 0.5-1 tablets (25-50 mg total) by  mouth daily. Patient taking differently: Take 50 mg by mouth daily.  10/12/14  Yes Marin Olp, MD  magnesium hydroxide (MILK OF MAGNESIA) 400 MG/5ML suspension Take 30 mLs by mouth daily as needed for mild constipation (constipation).    Yes Historical Provider, MD  morphine (MS CONTIN) 60 MG 12 hr tablet Take 60 mg by mouth every 12 (twelve) hours.   Yes Historical Provider, MD  morphine (MSIR) 30 MG tablet Take 30 mg by mouth every 4 (four) hours as needed for severe pain (pain).    Yes Historical Provider, MD  predniSONE (DELTASONE) 10 MG tablet 4 each am x 2 days, 2 each am x 2 days, 1 each am x 2 days Patient taking differently: Take 10-40 mg by mouth daily with breakfast. Take 4 tablets (40 mg) in the am for 2 Days, Take 2 tablets (20 mg) in the am for 2 Days, Take 1 tablet (10 mg) in the am for 2 Days. 12/28/14  Yes Tanda Rockers, MD  prochlorperazine (COMPAZINE) 10 MG tablet Take 1 tablet (10 mg total) by mouth every 6 (six) hours as needed for nausea. 01/22/14  Yes Heath Lark, MD  amoxicillin-clavulanate (AUGMENTIN) 875-125 MG per tablet Take 1 tablet by mouth 2 (two) times daily. Patient not taking: Reported on 12/28/2014 12/19/14   Heath Lark, MD  cyclobenzaprine (FLEXERIL) 10 MG tablet Take 1 tablet (10 mg total) by mouth 3 (three) times daily as needed for muscle spasms. Patient not taking: Reported on 12/28/2014 11/21/14   Heath Lark, MD  levofloxacin (LEVAQUIN) 500 MG tablet Take 1 tablet (500 mg total) by mouth daily. 12/28/14   Tanda Rockers, MD   BP 157/95 mmHg  Pulse 53  Temp(Src) 97.9 F (36.6 C) (Oral)  Resp 16  SpO2 93% Physical Exam  Constitutional: She appears well-developed and well-nourished. No distress.  HENT:  Head: Normocephalic and atraumatic.  Right Ear: External ear normal.  Left Ear: External ear normal.  Eyes: Conjunctivae are normal. Right eye exhibits no discharge. Left eye exhibits no discharge. No scleral icterus.  Neck: Neck supple. No tracheal  deviation present.  Cardiovascular: Normal rate, regular rhythm and intact distal pulses.   Pulmonary/Chest: Effort normal. No stridor. No respiratory distress. She has wheezes. She has no rales.  Abdominal: Soft. Bowel sounds are normal. She exhibits no distension. There is no tenderness. There is no rebound and no guarding.  Musculoskeletal: She exhibits no edema or tenderness.  Neurological: She is alert. She has normal strength. No cranial nerve deficit (no facial droop, extraocular movements intact, no slurred  speech) or sensory deficit. She exhibits normal muscle tone. She displays no seizure activity. Coordination normal.  Skin: Skin is warm and dry. No rash noted.  Psychiatric: She has a normal mood and affect.  Nursing note and vitals reviewed.   ED Course  Procedures (including critical care time) Labs Review Labs Reviewed  BASIC METABOLIC PANEL - Abnormal; Notable for the following:    Glucose, Bld 144 (*)    All other components within normal limits  CBC    Imaging Review Dg Chest 2 View  12/28/2014   CLINICAL DATA:  Shortness of breath, cough.  Chest pain.  EXAM: CHEST  2 VIEW  COMPARISON:  09/14/2014  FINDINGS: Right Port-A-Cath is in place with the tip in the SVC. Heart is normal size. Aorta is normal caliber. Lungs are clear. No effusions. No acute bony abnormality. Slight wedge deformity of multiple mid thoracic vertebral bodies, stable since prior study. Multiple old right rib fractures.  IMPRESSION: No active cardiopulmonary disease.   Electronically Signed   By: Rolm Baptise M.D.   On: 12/28/2014 20:04     EKG Interpretation   Date/Time:  Friday December 28 2014 18:55:48 EST Ventricular Rate:  60 PR Interval:  155 QRS Duration: 81 QT Interval:  436 QTC Calculation: 436 R Axis:   44 Text Interpretation:  Sinus rhythm Abnormal R-wave progression, early  transition No significant change since last tracing Confirmed by Abdulkarim Eberlin   MD-J, Jacquise Rarick ([id number]) on 12/28/2014  7:35:12 PM     Medications  albuterol (PROVENTIL,VENTOLIN) solution continuous neb (not administered)  ipratropium (ATROVENT) nebulizer solution 0.5 mg (0.5 mg Nebulization Given 12/28/14 2139)  methylPREDNISolone sodium succinate (SOLU-MEDROL) 125 mg/2 mL injection 125 mg (125 mg Intravenous Given 12/28/14 2001)  albuterol (PROVENTIL) (2.5 MG/3ML) 0.083% nebulizer solution 2.5 mg (2.5 mg Nebulization Given 12/28/14 2139)  diphenhydrAMINE (BENADRYL) injection 12.5 mg (12.5 mg Intravenous Given 12/28/14 2028)    MDM   Final diagnoses:  Chronic obstructive pulmonary disease, unspecified COPD, unspecified chronic bronchitis type    DIscussed her treatment with her.  Pt thinks she has taken albuterol nebulizer before.  Will try a neb here and monitor closely.  Pt tolerated the initial albuterol neb treatment without difficulty.  She continues to wheeze on exam.  Will try hour long neb treatment.    Dorie Rank, MD 12/28/14 [phone]

## 2014-12-29 NOTE — Discharge Instructions (Signed)

## 2015-01-02 ENCOUNTER — Other Ambulatory Visit: Payer: Self-pay

## 2015-01-02 ENCOUNTER — Ambulatory Visit: Payer: Self-pay

## 2015-01-08 ENCOUNTER — Telehealth: Payer: Self-pay | Admitting: *Deleted

## 2015-01-08 ENCOUNTER — Other Ambulatory Visit: Payer: Self-pay | Admitting: *Deleted

## 2015-01-08 DIAGNOSIS — C9000 Multiple myeloma not having achieved remission: Secondary | ICD-10-CM

## 2015-01-08 MED ORDER — LENALIDOMIDE 25 MG PO CAPS: ORAL_CAPSULE | ORAL | Status: DC

## 2015-01-08 NOTE — Telephone Encounter (Signed)
Refill request for Revllimid received. Taken to Dr Alvy Bimler

## 2015-01-09 ENCOUNTER — Other Ambulatory Visit (HOSPITAL_BASED_OUTPATIENT_CLINIC_OR_DEPARTMENT_OTHER): Payer: Medicare Other

## 2015-01-09 ENCOUNTER — Ambulatory Visit (HOSPITAL_BASED_OUTPATIENT_CLINIC_OR_DEPARTMENT_OTHER): Payer: Medicare Other

## 2015-01-09 ENCOUNTER — Ambulatory Visit: Payer: Medicare Other

## 2015-01-09 DIAGNOSIS — Z95828 Presence of other vascular implants and grafts: Secondary | ICD-10-CM

## 2015-01-09 DIAGNOSIS — C9000 Multiple myeloma not having achieved remission: Secondary | ICD-10-CM

## 2015-01-09 DIAGNOSIS — Z5112 Encounter for antineoplastic immunotherapy: Secondary | ICD-10-CM

## 2015-01-09 LAB — CBC WITH DIFFERENTIAL/PLATELET
BASO%: 0.7 % (ref 0.0–2.0)
Basophils Absolute: 0 10*3/uL (ref 0.0–0.1)
EOS%: 7.5 % — ABNORMAL HIGH (ref 0.0–7.0)
Eosinophils Absolute: 0.3 10*3/uL (ref 0.0–0.5)
HCT: 40.4 % (ref 34.8–46.6)
HGB: 13.5 g/dL (ref 11.6–15.9)
LYMPH%: 8.6 % — ABNORMAL LOW (ref 14.0–49.7)
MCH: 31.6 pg (ref 25.1–34.0)
MCHC: 33.4 g/dL (ref 31.5–36.0)
MCV: 94.6 fL (ref 79.5–101.0)
MONO#: 0.3 10*3/uL (ref 0.1–0.9)
MONO%: 5.8 % (ref 0.0–14.0)
NEUT%: 77.4 % — ABNORMAL HIGH (ref 38.4–76.8)
NEUTROS ABS: 3.3 10*3/uL (ref 1.5–6.5)
Platelets: 146 10*3/uL (ref 145–400)
RBC: 4.27 10*6/uL (ref 3.70–5.45)
RDW: 15.3 % — ABNORMAL HIGH (ref 11.2–14.5)
WBC: 4.3 10*3/uL (ref 3.9–10.3)
lymph#: 0.4 10*3/uL — ABNORMAL LOW (ref 0.9–3.3)

## 2015-01-09 LAB — COMPREHENSIVE METABOLIC PANEL (CC13)
ALBUMIN: 3.4 g/dL — AB (ref 3.5–5.0)
ALK PHOS: 66 U/L (ref 40–150)
ALT: 14 U/L (ref 0–55)
ANION GAP: 11 meq/L (ref 3–11)
AST: 16 U/L (ref 5–34)
BILIRUBIN TOTAL: 0.52 mg/dL (ref 0.20–1.20)
BUN: 11.6 mg/dL (ref 7.0–26.0)
CO2: 21 mEq/L — ABNORMAL LOW (ref 22–29)
CREATININE: 0.8 mg/dL (ref 0.6–1.1)
Calcium: 9 mg/dL (ref 8.4–10.4)
Chloride: 110 mEq/L — ABNORMAL HIGH (ref 98–109)
Glucose: 176 mg/dl — ABNORMAL HIGH (ref 70–140)
POTASSIUM: 3.8 meq/L (ref 3.5–5.1)
Sodium: 141 mEq/L (ref 136–145)
TOTAL PROTEIN: 6 g/dL — AB (ref 6.4–8.3)

## 2015-01-09 MED ORDER — SODIUM CHLORIDE 0.9 % IJ SOLN
10.0000 mL | INTRAMUSCULAR | Status: DC | PRN
  Filled 2015-01-09: qty 10

## 2015-01-09 MED ORDER — SODIUM CHLORIDE 0.9 % IV SOLN
Freq: Once | INTRAVENOUS | Status: AC
  Administered 2015-01-09: 10:00:00 via INTRAVENOUS

## 2015-01-09 MED ORDER — DIPHENHYDRAMINE HCL 25 MG PO CAPS
ORAL_CAPSULE | ORAL | Status: AC
  Filled 2015-01-09: qty 2

## 2015-01-09 MED ORDER — FAMOTIDINE IN NACL 20-0.9 MG/50ML-% IV SOLN
20.0000 mg | Freq: Once | INTRAVENOUS | Status: AC
  Administered 2015-01-09: 20 mg via INTRAVENOUS

## 2015-01-09 MED ORDER — ACETAMINOPHEN 325 MG PO TABS
650.0000 mg | ORAL_TABLET | Freq: Once | ORAL | Status: AC
  Administered 2015-01-09: 650 mg via ORAL

## 2015-01-09 MED ORDER — FAMOTIDINE IN NACL 20-0.9 MG/50ML-% IV SOLN
INTRAVENOUS | Status: AC
  Filled 2015-01-09: qty 50

## 2015-01-09 MED ORDER — SODIUM CHLORIDE 0.9 % IV SOLN
10.0000 mg/kg | Freq: Once | INTRAVENOUS | Status: AC
  Administered 2015-01-09: 600 mg via INTRAVENOUS
  Filled 2015-01-09: qty 24

## 2015-01-09 MED ORDER — ACETAMINOPHEN 325 MG PO TABS
ORAL_TABLET | ORAL | Status: AC
  Filled 2015-01-09: qty 2

## 2015-01-09 MED ORDER — HEPARIN SOD (PORK) LOCK FLUSH 100 UNIT/ML IV SOLN
500.0000 [IU] | Freq: Once | INTRAVENOUS | Status: AC | PRN
  Administered 2015-01-09: 500 [IU]
  Filled 2015-01-09: qty 5

## 2015-01-09 MED ORDER — DIPHENHYDRAMINE HCL 25 MG PO CAPS
50.0000 mg | ORAL_CAPSULE | Freq: Once | ORAL | Status: AC
  Administered 2015-01-09: 50 mg via ORAL

## 2015-01-09 MED ORDER — SODIUM CHLORIDE 0.9 % IV SOLN
Freq: Once | INTRAVENOUS | Status: AC
  Administered 2015-01-09: 12:00:00 via INTRAVENOUS
  Filled 2015-01-09: qty 4

## 2015-01-09 MED ORDER — SODIUM CHLORIDE 0.9 % IJ SOLN
10.0000 mL | INTRAMUSCULAR | Status: DC | PRN
  Administered 2015-01-09 (×2): 10 mL via INTRAVENOUS
  Filled 2015-01-09: qty 10

## 2015-01-09 NOTE — Patient Instructions (Signed)
[facility] Discharge Instructions for Patients Receiving Chemotherapy  Today you received the following chemotherapy agents Elotuzumab.  To help prevent nausea and vomiting after your treatment, we encourage you to take your nausea medication as prescribed.   If you develop nausea and vomiting that is not controlled by your nausea medication, call the clinic.   BELOW ARE SYMPTOMS THAT SHOULD BE REPORTED IMMEDIATELY:  *FEVER GREATER THAN 100.5 F  *CHILLS WITH OR WITHOUT FEVER  NAUSEA AND VOMITING THAT IS NOT CONTROLLED WITH YOUR NAUSEA MEDICATION  *UNUSUAL SHORTNESS OF BREATH  *UNUSUAL BRUISING OR BLEEDING  TENDERNESS IN MOUTH AND THROAT WITH OR WITHOUT PRESENCE OF ULCERS  *URINARY PROBLEMS  *BOWEL PROBLEMS  UNUSUAL RASH Items with * indicate a potential emergency and should be followed up as soon as possible.  Feel free to call the clinic you have any questions or concerns. The clinic phone number is [phone].    

## 2015-01-09 NOTE — Patient Instructions (Signed)

## 2015-01-10 DIAGNOSIS — Z79891 Long term (current) use of opiate analgesic: Secondary | ICD-10-CM | POA: Diagnosis not present

## 2015-01-10 DIAGNOSIS — M545 Low back pain: Secondary | ICD-10-CM | POA: Diagnosis not present

## 2015-01-10 DIAGNOSIS — G894 Chronic pain syndrome: Secondary | ICD-10-CM | POA: Diagnosis not present

## 2015-01-10 DIAGNOSIS — G893 Neoplasm related pain (acute) (chronic): Secondary | ICD-10-CM | POA: Diagnosis not present

## 2015-01-11 ENCOUNTER — Telehealth: Payer: Self-pay | Admitting: *Deleted

## 2015-01-11 NOTE — Telephone Encounter (Signed)
Received fax from [employer], Revlimid shipped on 01/10/15

## 2015-01-23 ENCOUNTER — Ambulatory Visit: Payer: Self-pay

## 2015-01-23 ENCOUNTER — Ambulatory Visit (HOSPITAL_BASED_OUTPATIENT_CLINIC_OR_DEPARTMENT_OTHER): Payer: Medicare Other | Admitting: Hematology and Oncology

## 2015-01-23 ENCOUNTER — Telehealth: Payer: Self-pay | Admitting: Hematology and Oncology

## 2015-01-23 ENCOUNTER — Other Ambulatory Visit (HOSPITAL_BASED_OUTPATIENT_CLINIC_OR_DEPARTMENT_OTHER): Payer: Medicare Other

## 2015-01-23 ENCOUNTER — Ambulatory Visit: Payer: Medicare Other

## 2015-01-23 ENCOUNTER — Ambulatory Visit (HOSPITAL_BASED_OUTPATIENT_CLINIC_OR_DEPARTMENT_OTHER): Payer: Medicare Other

## 2015-01-23 ENCOUNTER — Encounter: Payer: Self-pay | Admitting: Hematology and Oncology

## 2015-01-23 VITALS — BP 118/70 | HR 83 | Temp 97.7°F | Wt 127.4 lb

## 2015-01-23 DIAGNOSIS — Z5112 Encounter for antineoplastic immunotherapy: Secondary | ICD-10-CM

## 2015-01-23 DIAGNOSIS — K089 Disorder of teeth and supporting structures, unspecified: Secondary | ICD-10-CM

## 2015-01-23 DIAGNOSIS — M545 Low back pain, unspecified: Secondary | ICD-10-CM

## 2015-01-23 DIAGNOSIS — F17200 Nicotine dependence, unspecified, uncomplicated: Secondary | ICD-10-CM

## 2015-01-23 DIAGNOSIS — K007 Teething syndrome: Secondary | ICD-10-CM

## 2015-01-23 DIAGNOSIS — Z720 Tobacco use: Secondary | ICD-10-CM

## 2015-01-23 DIAGNOSIS — Z95828 Presence of other vascular implants and grafts: Secondary | ICD-10-CM

## 2015-01-23 DIAGNOSIS — C9000 Multiple myeloma not having achieved remission: Secondary | ICD-10-CM

## 2015-01-23 LAB — CBC WITH DIFFERENTIAL/PLATELET
BASO%: 1 % (ref 0.0–2.0)
Basophils Absolute: 0.1 10*3/uL (ref 0.0–0.1)
EOS%: 4.2 % (ref 0.0–7.0)
Eosinophils Absolute: 0.2 10*3/uL (ref 0.0–0.5)
HEMATOCRIT: 41 % (ref 34.8–46.6)
HGB: 13.6 g/dL (ref 11.6–15.9)
LYMPH%: 10.5 % — ABNORMAL LOW (ref 14.0–49.7)
MCH: 31 pg (ref 25.1–34.0)
MCHC: 33.2 g/dL (ref 31.5–36.0)
MCV: 93.3 fL (ref 79.5–101.0)
MONO#: 0.2 10*3/uL (ref 0.1–0.9)
MONO%: 2.9 % (ref 0.0–14.0)
NEUT#: 4.5 10*3/uL (ref 1.5–6.5)
NEUT%: 81.4 % — AB (ref 38.4–76.8)
PLATELETS: 157 10*3/uL (ref 145–400)
RBC: 4.4 10*6/uL (ref 3.70–5.45)
RDW: 16.6 % — ABNORMAL HIGH (ref 11.2–14.5)
WBC: 5.6 10*3/uL (ref 3.9–10.3)
lymph#: 0.6 10*3/uL — ABNORMAL LOW (ref 0.9–3.3)

## 2015-01-23 LAB — COMPREHENSIVE METABOLIC PANEL (CC13)
ALT: 16 U/L (ref 0–55)
ANION GAP: 10 meq/L (ref 3–11)
AST: 21 U/L (ref 5–34)
Albumin: 3.5 g/dL (ref 3.5–5.0)
Alkaline Phosphatase: 71 U/L (ref 40–150)
BUN: 10 mg/dL (ref 7.0–26.0)
CALCIUM: 9.3 mg/dL (ref 8.4–10.4)
CHLORIDE: 108 meq/L (ref 98–109)
CO2: 22 mEq/L (ref 22–29)
Creatinine: 0.7 mg/dL (ref 0.6–1.1)
Glucose: 124 mg/dl (ref 70–140)
POTASSIUM: 3.9 meq/L (ref 3.5–5.1)
Sodium: 140 mEq/L (ref 136–145)
Total Bilirubin: 0.55 mg/dL (ref 0.20–1.20)
Total Protein: 6.2 g/dL — ABNORMAL LOW (ref 6.4–8.3)

## 2015-01-23 MED ORDER — SODIUM CHLORIDE 0.9 % IV SOLN
Freq: Once | INTRAVENOUS | Status: AC
  Administered 2015-01-23: 12:00:00 via INTRAVENOUS
  Filled 2015-01-23: qty 4

## 2015-01-23 MED ORDER — ACETAMINOPHEN 325 MG PO TABS
ORAL_TABLET | ORAL | Status: AC
  Filled 2015-01-23: qty 2

## 2015-01-23 MED ORDER — SODIUM CHLORIDE 0.9 % IJ SOLN
10.0000 mL | INTRAMUSCULAR | Status: DC | PRN
  Administered 2015-01-23: 10 mL
  Filled 2015-01-23: qty 10

## 2015-01-23 MED ORDER — FAMOTIDINE IN NACL 20-0.9 MG/50ML-% IV SOLN
20.0000 mg | Freq: Once | INTRAVENOUS | Status: AC
  Administered 2015-01-23: 20 mg via INTRAVENOUS

## 2015-01-23 MED ORDER — ACETAMINOPHEN 325 MG PO TABS
650.0000 mg | ORAL_TABLET | Freq: Once | ORAL | Status: AC
  Administered 2015-01-23: 650 mg via ORAL

## 2015-01-23 MED ORDER — SODIUM CHLORIDE 0.9 % IJ SOLN
10.0000 mL | INTRAMUSCULAR | Status: DC | PRN
  Administered 2015-01-23: 10 mL via INTRAVENOUS
  Filled 2015-01-23: qty 10

## 2015-01-23 MED ORDER — SODIUM CHLORIDE 0.9 % IV SOLN
Freq: Once | INTRAVENOUS | Status: AC
  Administered 2015-01-23: 12:00:00 via INTRAVENOUS

## 2015-01-23 MED ORDER — FAMOTIDINE IN NACL 20-0.9 MG/50ML-% IV SOLN
INTRAVENOUS | Status: AC
  Filled 2015-01-23: qty 50

## 2015-01-23 MED ORDER — DIPHENHYDRAMINE HCL 25 MG PO CAPS
ORAL_CAPSULE | ORAL | Status: AC
  Filled 2015-01-23: qty 2

## 2015-01-23 MED ORDER — SODIUM CHLORIDE 0.9 % IV SOLN
10.0000 mg/kg | Freq: Once | INTRAVENOUS | Status: AC
  Administered 2015-01-23: 600 mg via INTRAVENOUS
  Filled 2015-01-23: qty 24

## 2015-01-23 MED ORDER — DIPHENHYDRAMINE HCL 25 MG PO CAPS
50.0000 mg | ORAL_CAPSULE | Freq: Once | ORAL | Status: AC
  Administered 2015-01-23: 50 mg via ORAL

## 2015-01-23 MED ORDER — HEPARIN SOD (PORK) LOCK FLUSH 100 UNIT/ML IV SOLN
500.0000 [IU] | Freq: Once | INTRAVENOUS | Status: AC | PRN
  Administered 2015-01-23: 500 [IU]
  Filled 2015-01-23: qty 5

## 2015-01-23 NOTE — Progress Notes (Signed)
[facility] OFFICE PROGRESS NOTE  Patient Care Team: Marin Olp, MD as PCP - General (Family Medicine) Jeanann Lewandowsky, MD as Consulting Physician (Medical Oncology) Marin Olp, MD as Consulting Physician (Family Medicine) Heath Lark, MD as Consulting Physician (Hematology and Oncology)  SUMMARY OF ONCOLOGIC HISTORY: Oncology History   Multiple myeloma, kappa light chain disease, Durie-Salmon stage III     Multiple myeloma   07/16/2006 Bone Marrow Biopsy BM biopsy is non-diagnostic   09/21/2006 Procedure L5 vertebral biopsy 5% plasma cell   10/25/2006 Bone Marrow Biopsy BM biopsy is hypercellular with 5% plasma cell   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   01/03/2007 -  Radiation Therapy Approximate date only, received RT for plasmacytoma followed by surgery   09/14/2007 Initial Diagnosis MULTIPLE  MYELOMA   10/05/2007 Bone Marrow Biopsy Bm biopsy was negative   06/18/2008 Bone Marrow Biopsy BM biopsy was negative   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 [facility]   04/13/2011 Relapse/Recurrence Disease relapse   04/14/2011 Bone Marrow Biopsy Bm biopsy showed 2 % plasma cell   04/27/2011 Relapse/Recurrence Disease relapse, treated with Velcade/Cytoxan/Dex   09/07/2011 - 07/28/2013 Chemotherapy She has been receiving Velcade   12/27/2011 Bone Marrow Transplant 2nd transplant at [facility]   07/28/2013 Relapse/Recurrence Chemo is stopped due to progression of disease   08/29/2013 Imaging PEt/CT showed recurrence of disease with new lesion on her rib with compression fracture   09/13/2013 Bone Marrow Biopsy BM biopsy is hypercellular with 6% plasma cell   10/10/2013 - 10/20/2013 Radiation Therapy Started on palliative XRT for rib pain   11/27/2013 - 04/06/2014 Chemotherapy The patient starts chemotherapy with Carfilzomib   07/19/2014 Imaging Repeat bloodwork and PET/CT scan shows significant disease progression.   08/02/2014 -  Chemotherapy She  enrolled in clinical trial using combination therapy with Revlimid, dexamethasone and Elotuzumab    INTERVAL HISTORY: Please see below for problem oriented charting. She is seen today prior to treatment. Overall, she feels well apart from persistent back pain. She has not made the appointment to see the dentist. She is still smoking but is attempting to quit. Denies recent infection.  REVIEW OF SYSTEMS:   Constitutional: Denies fevers, chills or abnormal weight loss Eyes: Denies blurriness of vision Ears, nose, mouth, throat, and face: Denies mucositis or sore throat Respiratory: Denies cough, dyspnea or wheezes Cardiovascular: Denies palpitation, chest discomfort or lower extremity swelling Gastrointestinal:  Denies nausea, heartburn or change in bowel habits Skin: Denies abnormal skin rashes Lymphatics: Denies new lymphadenopathy or easy bruising Neurological:Denies numbness, tingling or new weaknesses Behavioral/Psych: Mood is stable, no new changes  All other systems were reviewed with the patient and are negative.  I have reviewed the past medical history, past surgical history, social history and family history with the patient and they are unchanged from previous note.  ALLERGIES:  is allergic to codeine; ibuprofen; and albuterol.  MEDICATIONS:  Current Outpatient Prescriptions  Medication Sig Dispense Refill  . Alum & Mag Hydroxide-Simeth (MAGIC MOUTHWASH) SOLN Take 10 mLs by mouth 4 (four) times daily as needed for mouth pain. Swish and spit  Or  Swish and swallow. 300 mL 2  . amoxicillin-clavulanate (AUGMENTIN) 875-125 MG per tablet Take 1 tablet by mouth 2 (two) times daily. 14 tablet 0  . aspirin 81 MG tablet Take 81 mg by mouth daily.    . Cholecalciferol (VITAMIN D3) 5000 UNITS TABS Take 5,000 mg by mouth every morning. 30 tablet 0  . cyclobenzaprine (  FLEXERIL) 10 MG tablet Take 1 tablet (10 mg total) by mouth 3 (three) times daily as needed for muscle spasms. 60  tablet 0  . ipratropium (ATROVENT HFA) 17 MCG/ACT inhaler Inhale 2 puffs into the lungs every 4 (four) hours as needed for wheezing. 1 Inhaler 12  . lenalidomide (REVLIMID) 25 MG capsule TAKE ONE CAPSULE BY MOUTH EVERY DAY FOR 21 DAYS THEN 7 DAYS OFF. 21 capsule 0  . levofloxacin (LEVAQUIN) 500 MG tablet Take 1 tablet (500 mg total) by mouth daily. 7 tablet 0  . lidocaine-prilocaine (EMLA) cream Apply 1 application topically as needed. 30 g 0  . losartan (COZAAR) 50 MG tablet Take 0.5-1 tablets (25-50 mg total) by mouth daily. (Patient taking differently: Take 50 mg by mouth daily. ) 30 tablet 3  . magnesium hydroxide (MILK OF MAGNESIA) 400 MG/5ML suspension Take 30 mLs by mouth daily as needed for mild constipation (constipation).     . morphine (MS CONTIN) 60 MG 12 hr tablet Take 60 mg by mouth every 12 (twelve) hours.    Marland Kitchen morphine (MSIR) 30 MG tablet Take 30 mg by mouth every 4 (four) hours as needed for severe pain (pain).     . predniSONE (DELTASONE) 10 MG tablet 4 each am x 2 days, 2 each am x 2 days, 1 each am x 2 days (Patient taking differently: Take 10-40 mg by mouth daily with breakfast. Take 4 tablets (40 mg) in the am for 2 Days, Take 2 tablets (20 mg) in the am for 2 Days, Take 1 tablet (10 mg) in the am for 2 Days.) 14 tablet 0  . prochlorperazine (COMPAZINE) 10 MG tablet Take 1 tablet (10 mg total) by mouth every 6 (six) hours as needed for nausea. 60 tablet 3  . [DISCONTINUED] amLODipine (NORVASC) 5 MG tablet Take 1 tablet (5 mg total) by mouth daily. 90 tablet 3  . [DISCONTINUED] gabapentin (NEURONTIN) 600 MG tablet Take 600 mg by mouth 3 (three) times daily.       No current facility-administered medications for this visit.   Facility-Administered Medications Ordered in Other Visits  Medication Dose Route Frequency Provider Last Rate Last Dose  . sodium chloride 0.9 % injection 10 mL  10 mL Intravenous PRN Heath Lark, MD   10 mL at 02/26/14 1425  . sodium chloride 0.9 %  injection 10 mL  10 mL Intravenous PRN Chauncey Cruel, MD   10 mL at 04/05/14 1333  . sodium chloride 0.9 % injection 10 mL  10 mL Intracatheter PRN Heath Lark, MD   10 mL at 01/23/15 1403    PHYSICAL EXAMINATION: ECOG PERFORMANCE STATUS: 1 - Symptomatic but completely ambulatory  Filed Vitals:   01/23/15 1112  BP: 118/70  Pulse: 83  Temp: 97.7 F (36.5 C)   Filed Weights   01/23/15 1112  Weight: 127 lb 6.4 oz (57.788 kg)    GENERAL:alert, no distress and comfortable SKIN: skin color, texture, turgor are normal, no rashes or significant lesions EYES: normal, Conjunctiva are pink and non-injected, sclera clear OROPHARYNX:no exudate, no erythema and lips, buccal mucosa, and tongue normal  NECK: supple, thyroid normal size, non-tender, without nodularity LYMPH:  no palpable lymphadenopathy in the cervical, axillary or inguinal LUNGS: clear to auscultation and percussion with normal breathing effort HEART: regular rate & rhythm and no murmurs and no lower extremity edema ABDOMEN:abdomen soft, non-tender and normal bowel sounds Musculoskeletal:no cyanosis of digits and no clubbing  NEURO: alert & oriented x 3  with fluent speech, no focal motor/sensory deficits  LABORATORY DATA:  I have reviewed the data as listed    Component Value Date/Time   NA 140 01/23/2015 1043   NA 140 12/28/2014 1933   K 3.9 01/23/2015 1043   K 3.8 12/28/2014 1933   CL 109 12/28/2014 1933   CL 107 03/10/2013 1014   CO2 22 01/23/2015 1043   CO2 24 12/28/2014 1933   GLUCOSE 124 01/23/2015 1043   GLUCOSE 144* 12/28/2014 1933   GLUCOSE 111* 03/10/2013 1014   GLUCOSE 98 08/31/2006 1024   BUN 10.0 01/23/2015 1043   BUN 14 12/28/2014 1933   CREATININE 0.7 01/23/2015 1043   CREATININE 0.67 12/28/2014 1933   CALCIUM 9.3 01/23/2015 1043   CALCIUM 8.8 12/28/2014 1933   PROT 6.2* 01/23/2015 1043   PROT 6.6 09/11/2014 0336   ALBUMIN 3.5 01/23/2015 1043   ALBUMIN 3.4* 09/11/2014 0336   AST 21  01/23/2015 1043   AST 18 09/11/2014 0336   ALT 16 01/23/2015 1043   ALT 18 09/11/2014 0336   ALKPHOS 71 01/23/2015 1043   ALKPHOS 113 09/11/2014 0336   BILITOT 0.55 01/23/2015 1043   BILITOT 0.6 09/11/2014 0336   GFRNONAA >90 12/28/2014 1933   GFRAA >90 12/28/2014 1933    No results found for: SPEP, UPEP  Lab Results  Component Value Date   WBC 5.6 01/23/2015   NEUTROABS 4.5 01/23/2015   HGB 13.6 01/23/2015   HCT 41.0 01/23/2015   MCV 93.3 01/23/2015   PLT 157 01/23/2015      Chemistry      Component Value Date/Time   NA 140 01/23/2015 1043   NA 140 12/28/2014 1933   K 3.9 01/23/2015 1043   K 3.8 12/28/2014 1933   CL 109 12/28/2014 1933   CL 107 03/10/2013 1014   CO2 22 01/23/2015 1043   CO2 24 12/28/2014 1933   BUN 10.0 01/23/2015 1043   BUN 14 12/28/2014 1933   CREATININE 0.7 01/23/2015 1043   CREATININE 0.67 12/28/2014 1933      Component Value Date/Time   CALCIUM 9.3 01/23/2015 1043   CALCIUM 8.8 12/28/2014 1933   ALKPHOS 71 01/23/2015 1043   ALKPHOS 113 09/11/2014 0336   AST 21 01/23/2015 1043   AST 18 09/11/2014 0336   ALT 16 01/23/2015 1043   ALT 18 09/11/2014 0336   BILITOT 0.55 01/23/2015 1043   BILITOT 0.6 09/11/2014 0336     ASSESSMENT & PLAN:  Multiple myeloma Clinically, she is tolerating treatment very well. Recent light chain study show she have responded very well to treatment.  Clinically, she is doing well. If the next repeat blood work show near complete response to treatment, I will discontinue dexamethasone altogether. She will continue Elotuzumab with Revlimid      Smoker I spent some time counseling the patient the importance of tobacco cessation. She is currently not interested to quit now.    Poor dentition The patient has poor dentition. She has not made an appointment to see the dentist. For that reason, she has not received Zometa lately. I continue to encourage her to make appointment to see a  dentist.   Bilateral low back pain without sciatica She has worsening pain due to progression of disease. She is currently following another physician for pain management. She felt that recent dexamethasone appears to have helped with the pain and she continues her same.    Orders Placed This Encounter  Procedures  . CBC with Differential/Platelet  Standing Status: Standing     Number of Occurrences: 22     Standing Expiration Date: 01/23/2016  . SPEP & IFE with QIG    Standing Status: Future     Number of Occurrences:      Standing Expiration Date: 02/27/2016  . Kappa/lambda light chains    Standing Status: Future     Number of Occurrences:      Standing Expiration Date: 02/27/2016  . Beta 2 microglobulin, serum    Standing Status: Future     Number of Occurrences:      Standing Expiration Date: 02/27/2016   All questions were answered. The patient knows to call the clinic with any problems, questions or concerns. No barriers to learning was detected. I spent 25 minutes counseling the patient face to face. The total time spent in the appointment was 30 minutes and more than 50% was on counseling and review of test results     [facility], [place], MD 01/23/2015 4:40 PM

## 2015-01-23 NOTE — Telephone Encounter (Signed)
gave and printed appt sched and avs fo rpt for April adn May.....sed added tx. °

## 2015-01-23 NOTE — Patient Instructions (Signed)
[facility] Discharge Instructions for Patients Receiving Chemotherapy  Today you received the following chemotherapy agents:  Elotuzumab  To help prevent nausea and vomiting after your treatment, we encourage you to take your nausea medication as ordered per MD.   If you develop nausea and vomiting that is not controlled by your nausea medication, call the clinic.   BELOW ARE SYMPTOMS THAT SHOULD BE REPORTED IMMEDIATELY:  *FEVER GREATER THAN 100.5 F  *CHILLS WITH OR WITHOUT FEVER  NAUSEA AND VOMITING THAT IS NOT CONTROLLED WITH YOUR NAUSEA MEDICATION  *UNUSUAL SHORTNESS OF BREATH  *UNUSUAL BRUISING OR BLEEDING  TENDERNESS IN MOUTH AND THROAT WITH OR WITHOUT PRESENCE OF ULCERS  *URINARY PROBLEMS  *BOWEL PROBLEMS  UNUSUAL RASH Items with * indicate a potential emergency and should be followed up as soon as possible.  Feel free to call the clinic you have any questions or concerns. The clinic phone number is [phone].    

## 2015-01-23 NOTE — Assessment & Plan Note (Signed)
Clinically, she is tolerating treatment very well. Recent light chain study show she have responded very well to treatment.  Clinically, she is doing well. If the next repeat blood work show near complete response to treatment, I will discontinue dexamethasone altogether. She will continue Elotuzumab with Revlimid

## 2015-01-23 NOTE — Assessment & Plan Note (Signed)
The patient has poor dentition. She has not made an appointment to see the dentist. For that reason, she has not received Zometa lately. I continue to encourage her to make appointment to see a dentist.

## 2015-01-23 NOTE — Assessment & Plan Note (Signed)
I spent some time counseling the patient the importance of tobacco cessation. She is currently not interested to quit now. 

## 2015-01-23 NOTE — Assessment & Plan Note (Signed)
She has worsening pain due to progression of disease. She is currently following another physician for pain management. She felt that recent dexamethasone appears to have helped with the pain and she continues her same.

## 2015-01-23 NOTE — Patient Instructions (Signed)

## 2015-02-05 ENCOUNTER — Telehealth: Payer: Self-pay | Admitting: *Deleted

## 2015-02-05 ENCOUNTER — Other Ambulatory Visit: Payer: Self-pay | Admitting: *Deleted

## 2015-02-05 DIAGNOSIS — C9000 Multiple myeloma not having achieved remission: Secondary | ICD-10-CM

## 2015-02-05 MED ORDER — LENALIDOMIDE 25 MG PO CAPS: ORAL_CAPSULE | ORAL | Status: DC

## 2015-02-05 NOTE — Telephone Encounter (Signed)
Refill request for Revlimid received. Taken to Dr Alvy Bimler to sign

## 2015-02-06 ENCOUNTER — Other Ambulatory Visit (HOSPITAL_BASED_OUTPATIENT_CLINIC_OR_DEPARTMENT_OTHER): Payer: Medicare Other

## 2015-02-06 ENCOUNTER — Ambulatory Visit: Payer: Medicare Other

## 2015-02-06 ENCOUNTER — Ambulatory Visit (HOSPITAL_BASED_OUTPATIENT_CLINIC_OR_DEPARTMENT_OTHER): Payer: Medicare Other

## 2015-02-06 DIAGNOSIS — Z5112 Encounter for antineoplastic immunotherapy: Secondary | ICD-10-CM

## 2015-02-06 DIAGNOSIS — C9000 Multiple myeloma not having achieved remission: Secondary | ICD-10-CM

## 2015-02-06 DIAGNOSIS — Z95828 Presence of other vascular implants and grafts: Secondary | ICD-10-CM

## 2015-02-06 LAB — CBC WITH DIFFERENTIAL/PLATELET
BASO%: 1.3 % (ref 0.0–2.0)
BASOS ABS: 0 10*3/uL (ref 0.0–0.1)
EOS%: 7.4 % — ABNORMAL HIGH (ref 0.0–7.0)
Eosinophils Absolute: 0.2 10*3/uL (ref 0.0–0.5)
HEMATOCRIT: 41 % (ref 34.8–46.6)
HGB: 13.5 g/dL (ref 11.6–15.9)
LYMPH%: 16 % (ref 14.0–49.7)
MCH: 30.7 pg (ref 25.1–34.0)
MCHC: 32.9 g/dL (ref 31.5–36.0)
MCV: 93.4 fL (ref 79.5–101.0)
MONO#: 0.1 10*3/uL (ref 0.1–0.9)
MONO%: 4.8 % (ref 0.0–14.0)
NEUT#: 2.2 10*3/uL (ref 1.5–6.5)
NEUT%: 70.5 % (ref 38.4–76.8)
Platelets: 181 10*3/uL (ref 145–400)
RBC: 4.4 10*6/uL (ref 3.70–5.45)
RDW: 17.2 % — ABNORMAL HIGH (ref 11.2–14.5)
WBC: 3.1 10*3/uL — AB (ref 3.9–10.3)
lymph#: 0.5 10*3/uL — ABNORMAL LOW (ref 0.9–3.3)

## 2015-02-06 LAB — COMPREHENSIVE METABOLIC PANEL (CC13)
ALK PHOS: 75 U/L (ref 40–150)
ALT: 16 U/L (ref 0–55)
AST: 21 U/L (ref 5–34)
Albumin: 3.4 g/dL — ABNORMAL LOW (ref 3.5–5.0)
Anion Gap: 9 mEq/L (ref 3–11)
BUN: 9.5 mg/dL (ref 7.0–26.0)
CO2: 22 mEq/L (ref 22–29)
Calcium: 8.8 mg/dL (ref 8.4–10.4)
Chloride: 109 mEq/L (ref 98–109)
Creatinine: 0.7 mg/dL (ref 0.6–1.1)
EGFR: >90 mL/min/{1.73_m2} (ref >90)
Glucose: 149 mg/dl — ABNORMAL HIGH (ref 70–140)
POTASSIUM: 3.9 meq/L (ref 3.5–5.1)
Sodium: 140 mEq/L (ref 136–145)
Total Bilirubin: 0.48 mg/dL (ref 0.20–1.20)
Total Protein: 6 g/dL — ABNORMAL LOW (ref 6.4–8.3)

## 2015-02-06 MED ORDER — DIPHENHYDRAMINE HCL 25 MG PO CAPS
50.0000 mg | ORAL_CAPSULE | Freq: Once | ORAL | Status: AC
  Administered 2015-02-06: 50 mg via ORAL

## 2015-02-06 MED ORDER — ACETAMINOPHEN 325 MG PO TABS
ORAL_TABLET | ORAL | Status: AC
  Filled 2015-02-06: qty 2

## 2015-02-06 MED ORDER — SODIUM CHLORIDE 0.9 % IJ SOLN
10.0000 mL | INTRAMUSCULAR | Status: DC | PRN
  Administered 2015-02-06: 10 mL
  Filled 2015-02-06: qty 10

## 2015-02-06 MED ORDER — FAMOTIDINE IN NACL 20-0.9 MG/50ML-% IV SOLN
INTRAVENOUS | Status: AC
  Filled 2015-02-06: qty 50

## 2015-02-06 MED ORDER — SODIUM CHLORIDE 0.9 % IJ SOLN
10.0000 mL | INTRAMUSCULAR | Status: DC | PRN
  Administered 2015-02-06: 10 mL via INTRAVENOUS
  Filled 2015-02-06: qty 10

## 2015-02-06 MED ORDER — ACETAMINOPHEN 325 MG PO TABS
650.0000 mg | ORAL_TABLET | Freq: Once | ORAL | Status: AC
  Administered 2015-02-06: 650 mg via ORAL

## 2015-02-06 MED ORDER — HEPARIN SOD (PORK) LOCK FLUSH 100 UNIT/ML IV SOLN
500.0000 [IU] | Freq: Once | INTRAVENOUS | Status: AC | PRN
  Administered 2015-02-06: 500 [IU]
  Filled 2015-02-06: qty 5

## 2015-02-06 MED ORDER — SODIUM CHLORIDE 0.9 % IV SOLN
Freq: Once | INTRAVENOUS | Status: AC
  Administered 2015-02-06: 13:00:00 via INTRAVENOUS

## 2015-02-06 MED ORDER — FAMOTIDINE IN NACL 20-0.9 MG/50ML-% IV SOLN
20.0000 mg | Freq: Once | INTRAVENOUS | Status: AC
  Administered 2015-02-06: 20 mg via INTRAVENOUS

## 2015-02-06 MED ORDER — SODIUM CHLORIDE 0.9 % IV SOLN
Freq: Once | INTRAVENOUS | Status: AC
  Administered 2015-02-06: 13:00:00 via INTRAVENOUS
  Filled 2015-02-06: qty 4

## 2015-02-06 MED ORDER — DIPHENHYDRAMINE HCL 25 MG PO CAPS
ORAL_CAPSULE | ORAL | Status: AC
  Filled 2015-02-06: qty 2

## 2015-02-06 MED ORDER — ELOTUZUMAB CHEMO INJECTION 400 MG
10.0000 mg/kg | Freq: Once | INTRAVENOUS | Status: AC
  Administered 2015-02-06: 600 mg via INTRAVENOUS
  Filled 2015-02-06: qty 24

## 2015-02-06 NOTE — Patient Instructions (Signed)
[facility] Discharge Instructions for Patients Receiving Chemotherapy  Today you received the following chemotherapy agents Elotuzumab  To help prevent nausea and vomiting after your treatment, we encourage you to take your nausea medication as directed.    If you develop nausea and vomiting that is not controlled by your nausea medication, call the clinic.   BELOW ARE SYMPTOMS THAT SHOULD BE REPORTED IMMEDIATELY:  *FEVER GREATER THAN 100.5 F  *CHILLS WITH OR WITHOUT FEVER  NAUSEA AND VOMITING THAT IS NOT CONTROLLED WITH YOUR NAUSEA MEDICATION  *UNUSUAL SHORTNESS OF BREATH  *UNUSUAL BRUISING OR BLEEDING  TENDERNESS IN MOUTH AND THROAT WITH OR WITHOUT PRESENCE OF ULCERS  *URINARY PROBLEMS  *BOWEL PROBLEMS  UNUSUAL RASH Items with * indicate a potential emergency and should be followed up as soon as possible.  Feel free to call the clinic you have any questions or concerns. The clinic phone number is [phone].  Please show the [facility] at check-in to the Emergency Department and triage nurse.

## 2015-02-06 NOTE — Patient Instructions (Signed)

## 2015-02-10 ENCOUNTER — Emergency Department (HOSPITAL_COMMUNITY)
Admission: EM | Admit: 2015-02-10 | Discharge: 2015-02-10 | Disposition: A | Discharge status: Home / Self Care | Payer: Medicare Other | Attending: Emergency Medicine | Admitting: Emergency Medicine

## 2015-02-10 ENCOUNTER — Encounter (HOSPITAL_COMMUNITY): Payer: Self-pay

## 2015-02-10 DIAGNOSIS — Z8619 Personal history of other infectious and parasitic diseases: Secondary | ICD-10-CM | POA: Insufficient documentation

## 2015-02-10 DIAGNOSIS — R040 Epistaxis: Secondary | ICD-10-CM | POA: Diagnosis not present

## 2015-02-10 DIAGNOSIS — M1990 Unspecified osteoarthritis, unspecified site: Secondary | ICD-10-CM | POA: Insufficient documentation

## 2015-02-10 DIAGNOSIS — Z885 Allergy status to narcotic agent status: Secondary | ICD-10-CM | POA: Diagnosis not present

## 2015-02-10 DIAGNOSIS — Z7952 Long term (current) use of systemic steroids: Secondary | ICD-10-CM | POA: Diagnosis not present

## 2015-02-10 DIAGNOSIS — Z720 Tobacco use: Secondary | ICD-10-CM | POA: Diagnosis not present

## 2015-02-10 DIAGNOSIS — Z86010 Personal history of colonic polyps: Secondary | ICD-10-CM | POA: Insufficient documentation

## 2015-02-10 DIAGNOSIS — Z8579 Personal history of other malignant neoplasms of lymphoid, hematopoietic and related tissues: Secondary | ICD-10-CM | POA: Insufficient documentation

## 2015-02-10 DIAGNOSIS — Z8719 Personal history of other diseases of the digestive system: Secondary | ICD-10-CM | POA: Insufficient documentation

## 2015-02-10 DIAGNOSIS — Z8659 Personal history of other mental and behavioral disorders: Secondary | ICD-10-CM | POA: Insufficient documentation

## 2015-02-10 DIAGNOSIS — Z7982 Long term (current) use of aspirin: Secondary | ICD-10-CM | POA: Insufficient documentation

## 2015-02-10 DIAGNOSIS — I10 Essential (primary) hypertension: Secondary | ICD-10-CM | POA: Diagnosis not present

## 2015-02-10 DIAGNOSIS — G8929 Other chronic pain: Secondary | ICD-10-CM | POA: Insufficient documentation

## 2015-02-10 NOTE — ED Notes (Signed)
Patient is alert and oriented x3.  She was given DC instructions and follow up visit instructions.  Patient gave verbal understanding. She was DC ambulatory under her own power to home.  V/S stable.  He was not showing any signs of distress on DC 

## 2015-02-10 NOTE — Discharge Instructions (Signed)
IF NOSEBLEED RECURS, HOLD PRESSURE AS DISCUSSED FOR 15 MINUTES. IF BLEEDING CONTINUES, RETURN TO THE EMERGENCY DEPARTMENT OR GO TO BE SEEN BY YOUR DOCTOR FOR FURTHER MANAGEMENT.    Nosebleed A nosebleed can be caused by many things, including:  Getting hit hard in the nose.  Infections.  Dry nose.  Colds.  Medicines. Your doctor may do lab testing if you get nosebleeds a lot and the cause is not known. HOME CARE   If your nose was packed with material, keep it there until your doctor takes it out. Put the pack back in your nose if the pack falls out.  Do not blow your nose for 12 hours after the nosebleed.  Sit up and bend forward if your nose starts bleeding again. Pinch the front half of your nose nonstop for 20 minutes.  Put petroleum jelly inside your nose every morning if you have a dry nose.  Use a humidifier to make the air less dry.  Do not take aspirin.  Try not to strain, lift, or bend at the waist for many days after the nosebleed. GET HELP RIGHT AWAY IF:   Nosebleeds keep happening and are hard to stop or control.  You have bleeding or bruises that are not normal on other parts of the body.  You have a fever.  The nosebleeds get worse.  You get lightheaded, feel faint, sweaty, or throw up (vomit) blood. MAKE SURE YOU:   Understand these instructions.  Will watch your condition.  Will get help right away if you are not doing well or get worse. Document Released: 07/28/2008 Document Revised: 01/11/2012 Document Reviewed: 07/28/2008 [facility] Patient Information 2015 [place], [place]. This information is not intended to replace advice given to you by your health care provider. Make sure you discuss any questions you have with your health care provider.

## 2015-02-10 NOTE — ED Notes (Signed)
Patient is alert and oriented x3.  She is complaining of a spotaneous nose bleed that started approximately 11pm.  Patient denies any trauma to nose area.  States that she has had coughed  Up some clots.

## 2015-02-10 NOTE — ED Notes (Signed)
Patient reports she was lying in bed an hour ago when she though her nose was running.  She reports left nare has bled continuously since then.  She reports several blood clots as well.

## 2015-02-11 DIAGNOSIS — G893 Neoplasm related pain (acute) (chronic): Secondary | ICD-10-CM | POA: Diagnosis not present

## 2015-02-11 DIAGNOSIS — M545 Low back pain: Secondary | ICD-10-CM | POA: Diagnosis not present

## 2015-02-11 DIAGNOSIS — Z79891 Long term (current) use of opiate analgesic: Secondary | ICD-10-CM | POA: Diagnosis not present

## 2015-02-11 DIAGNOSIS — G894 Chronic pain syndrome: Secondary | ICD-10-CM | POA: Diagnosis not present

## 2015-02-11 NOTE — ED Provider Notes (Signed)
CSN: [id number]     Arrival date & time 02/10/15  0013 History   First MD Initiated Contact with Patient 02/10/15 0134     Chief Complaint  Patient presents with  . Epistaxis     (Consider location/radiation/quality/duration/timing/severity/associated sxs/prior Treatment) Patient is a 64 y.o. female presenting with nosebleeds. The history is provided by the patient. No language interpreter was used.  Epistaxis Location:  L nare Severity:  Moderate Duration:  1 hour Timing:  Constant Progression:  Resolved Chronicity:  New Associated symptoms: no congestion, no fever and no headaches   Associated symptoms comment:  She presents to the ED with complaint of nosebleed that occurred earlier tonight at home. She reports passing clots with continuous bleeding lasting one hour and resolved just prior to arrival. No pain or trauma. She denies cold symptoms or runny nose. No history of anticoagulants.    Past Medical History  Diagnosis Date  . Depression   . Hypertension   . Hepatitis C     genotype 1  . Diverticulosis of colon   . Multiple myeloma     kappa light chain, s/p high-dose chemo/stem cell tx - [facility]  . Hx of adenomatous colonic polyps 2007  . Anxiety   . Osteoarthritis   . Sleep apnea   . Hyperlipidemia   . GERD with stricture   . External hemorrhoids   . Hepatitis B virus infection   . IBS (irritable bowel syndrome)   . Helicobacter pylori gastritis 2001    ? if treated  . Sciatic nerve pain     with Dr. Earle Gell  . Chronic low back pain   . Unspecified vitamin D deficiency 09/22/2013  . Allergy   . Fever 11/28/2013  . Dehydration 11/28/2013  . Nausea alone 01/22/2014  . Cough 08/02/2014  . DIVERTICULOSIS, COLON 08/31/2006  . DEPRESSION 08/31/2006    Qualifier: Diagnosis of  By: [name], [name]    . Muscle cramp 11/21/2014   Past Surgical History  Procedure Laterality Date  . Abdominal hysterectomy    . Oophorectomy    . Dilation and curettage of uterus     . Bone marrow transplant      2009, 2013 [facility]  . Appendectomy    . Colonoscopy w/ polypectomy  08/2006    1-2 adenomas, 6 polyps total, diverticulosis and external hemorrhoids  . Upper gastrointestinal endoscopy  08/2003    esophageal stricture dilation, hiatal hernia, gastrritis  . Cholecystectomy    . Tonsillectomy and adenoidectomy     Family History  Problem Relation Age of Onset  . Alcohol abuse    . Lung cancer Father     smoked  . Leukemia Mother   . Cirrhosis Sister   . Colon cancer Neg Hx   . Lung cancer Sister     smoked   History  Substance Use Topics  . Smoking status: Current Every Day Smoker -- 0.25 packs/day for 44 years    Types: Cigarettes  . Smokeless tobacco: Never Used  . Alcohol Use: No   OB History    No data available     Review of Systems  Constitutional: Negative for fever.  HENT: Positive for nosebleeds. Negative for congestion and trouble swallowing.   Gastrointestinal: Negative for nausea.  Neurological: Negative for syncope, light-headedness and headaches.      Allergies  Codeine; Ibuprofen; and Albuterol  Home Medications   Prior to Admission medications   Medication Sig Start Date End Date Taking? Authorizing Provider  aspirin 81 MG tablet Take 81 mg by mouth daily.   Yes Historical Provider, MD  Cholecalciferol (VITAMIN D3) 5000 UNITS TABS Take 5,000 mg by mouth every morning. 12/05/14  Yes Heath Lark, MD  lenalidomide (REVLIMID) 25 MG capsule TAKE ONE CAPSULE BY MOUTH EVERY DAY FOR 21 DAYS THEN 7 DAYS OFF. 02/05/15  Yes Ni Gorsuch, MD  lidocaine (LIDODERM) 5 % Place 1 patch onto the skin daily as needed (pain). Remove & Discard patch within 12 hours or as directed by MD   Yes Historical Provider, MD  lidocaine-prilocaine (EMLA) cream Apply 1 application topically as needed. Patient taking differently: Apply 1 application topically daily as needed. Port access 12/05/14  Yes Heath Lark, MD  losartan (COZAAR) 50 MG tablet Take 0.5-1  tablets (25-50 mg total) by mouth daily. Patient taking differently: Take 50 mg by mouth daily.  10/12/14  Yes Marin Olp, MD  magnesium hydroxide (MILK OF MAGNESIA) 400 MG/5ML suspension Take 30 mLs by mouth daily as needed for mild constipation (constipation).    Yes Historical Provider, MD  morphine (MS CONTIN) 60 MG 12 hr tablet Take 60 mg by mouth every 12 (twelve) hours.   Yes Historical Provider, MD  morphine (MSIR) 30 MG tablet Take 30 mg by mouth every 4 (four) hours as needed for severe pain (pain).    Yes Historical Provider, MD  Alum & Mag Hydroxide-Simeth (MAGIC MOUTHWASH) SOLN Take 10 mLs by mouth 4 (four) times daily as needed for mouth pain. Swish and spit  Or  Swish and swallow. Patient not taking: Reported on 02/10/2015 03/29/14   Heath Lark, MD  amoxicillin-clavulanate (AUGMENTIN) 875-125 MG per tablet Take 1 tablet by mouth 2 (two) times daily. Patient not taking: Reported on 02/10/2015 12/19/14   Heath Lark, MD  cyclobenzaprine (FLEXERIL) 10 MG tablet Take 1 tablet (10 mg total) by mouth 3 (three) times daily as needed for muscle spasms. Patient not taking: Reported on 02/10/2015 11/21/14   Heath Lark, MD  ipratropium (ATROVENT HFA) 17 MCG/ACT inhaler Inhale 2 puffs into the lungs every 4 (four) hours as needed for wheezing. 09/13/14   Heath Lark, MD  levofloxacin (LEVAQUIN) 500 MG tablet Take 1 tablet (500 mg total) by mouth daily. Patient not taking: Reported on 02/10/2015 12/28/14   Tanda Rockers, MD  predniSONE (DELTASONE) 10 MG tablet 4 each am x 2 days, 2 each am x 2 days, 1 each am x 2 days Patient not taking: Reported on 02/10/2015 12/28/14   Tanda Rockers, MD  prochlorperazine (COMPAZINE) 10 MG tablet Take 1 tablet (10 mg total) by mouth every 6 (six) hours as needed for nausea. Patient not taking: Reported on 02/10/2015 01/22/14   Heath Lark, MD   BP 165/98 mmHg  Pulse 75  Temp(Src) 98.2 F (36.8 C) (Oral)  Resp 18  Ht $R'5\' 4"'Jm$  (1.626 m)  Wt 128 lb (58.06 kg)  BMI  21.96 kg/m2  SpO2 98% Physical Exam  Constitutional: She is oriented to person, place, and time. She appears well-developed and well-nourished.  HENT:  Dried blood in left nare. Once removed, site of bleeding visualized in anterior nare. No active bleeding.   Neck: Normal range of motion.  Pulmonary/Chest: Effort normal.  Abdominal: There is no tenderness.  Neurological: She is alert and oriented to person, place, and time.  Skin: Skin is warm and dry.  Psychiatric: She has a normal mood and affect.    ED Course  Procedures (including critical care time) Labs  Review Labs Reviewed - No data to display  Imaging Review No results found.   EKG Interpretation None      MDM   Final diagnoses:  Anterior epistaxis    No bleeding currently. Discussed pressure if there is another bleed. Return precautions discussed.     Charlann Lange, PA-C 02/11/15 8097  Rolland Porter, MD 02/21/15 2302

## 2015-02-12 ENCOUNTER — Telehealth: Payer: Self-pay | Admitting: *Deleted

## 2015-02-12 NOTE — Telephone Encounter (Signed)
Pt called and left message wanting to talk to nurse.  Spoke with pt and was informed that on Saturday PM pt had nose bleed with bright red blood, and several dark colored blood clots coming up from her throat.  Pt went to ER , and just had blood cleaning out from her nose. On Sunday AM , pt developed slight pain in the head.  Pt denied blurred vision nor double vision, denied nausea/vomiting.  Just " nagging headache " per pt.  Pt saw her pain management provider yesterday and was told to take Tylenol for headache. Pt will restart new cycle of  Revlimid  25mg  daily x 21 days  On  02/13/15. Message sent to Dr. Alvy Bimler and Jill Alexanders, desk nurse today. Pt's  Phone   [phone].

## 2015-02-12 NOTE — Telephone Encounter (Signed)
Received response from Dr. Alvy Bimler.  Spoke with pt and informed pt that Dr. Alvy Bimler did not think nosebleed related to treatment.  MD was not sure if it's sinusitis causing bleeding or anything new.  Informed pt that if the bleeding recurs, md will refer pt to see ENT.  Pt voiced understanding.

## 2015-02-12 NOTE — Telephone Encounter (Signed)
The nosebleed unlikely due to treatment. I'm not sure whether is a sinusitis causing bleeding and anything new If it recurs, we will refer to ENT

## 2015-02-14 NOTE — Telephone Encounter (Signed)
[facility] sent facsimile confirmation of Revlimid prescription shipment.  Revlimid was shipped on 02-11-2015 with next business day delivery.  "If any delays in shipment [facility] will proactively contact [place] with updates and next steps."

## 2015-02-20 ENCOUNTER — Ambulatory Visit: Payer: Medicare Other

## 2015-02-20 ENCOUNTER — Other Ambulatory Visit (HOSPITAL_BASED_OUTPATIENT_CLINIC_OR_DEPARTMENT_OTHER): Payer: Medicare Other

## 2015-02-20 ENCOUNTER — Ambulatory Visit (HOSPITAL_BASED_OUTPATIENT_CLINIC_OR_DEPARTMENT_OTHER): Payer: Medicare Other

## 2015-02-20 VITALS — BP 140/84 | HR 74 | Temp 98.0°F | Resp 19

## 2015-02-20 DIAGNOSIS — C9000 Multiple myeloma not having achieved remission: Secondary | ICD-10-CM

## 2015-02-20 DIAGNOSIS — Z95828 Presence of other vascular implants and grafts: Secondary | ICD-10-CM

## 2015-02-20 DIAGNOSIS — C9002 Multiple myeloma in relapse: Secondary | ICD-10-CM | POA: Diagnosis not present

## 2015-02-20 DIAGNOSIS — Z5112 Encounter for antineoplastic immunotherapy: Secondary | ICD-10-CM

## 2015-02-20 LAB — CBC WITH DIFFERENTIAL/PLATELET
BASO%: 1.2 % (ref 0.0–2.0)
Basophils Absolute: 0.1 10*3/uL (ref 0.0–0.1)
EOS ABS: 0.1 10*3/uL (ref 0.0–0.5)
EOS%: 2.1 % (ref 0.0–7.0)
HEMATOCRIT: 41.8 % (ref 34.8–46.6)
HEMOGLOBIN: 13.8 g/dL (ref 11.6–15.9)
LYMPH%: 11.3 % — AB (ref 14.0–49.7)
MCH: 30.7 pg (ref 25.1–34.0)
MCHC: 32.9 g/dL (ref 31.5–36.0)
MCV: 93.2 fL (ref 79.5–101.0)
MONO#: 0.1 10*3/uL (ref 0.1–0.9)
MONO%: 1.6 % (ref 0.0–14.0)
NEUT#: 3.8 10*3/uL (ref 1.5–6.5)
NEUT%: 83.8 % — AB (ref 38.4–76.8)
Platelets: 172 10*3/uL (ref 145–400)
RBC: 4.48 10*6/uL (ref 3.70–5.45)
RDW: 16.7 % — ABNORMAL HIGH (ref 11.2–14.5)
WBC: 4.6 10*3/uL (ref 3.9–10.3)
lymph#: 0.5 10*3/uL — ABNORMAL LOW (ref 0.9–3.3)

## 2015-02-20 LAB — COMPREHENSIVE METABOLIC PANEL (CC13)
ALBUMIN: 3.6 g/dL (ref 3.5–5.0)
ALT: 13 U/L (ref 0–55)
ANION GAP: 15 meq/L — AB (ref 3–11)
AST: 18 U/L (ref 5–34)
Alkaline Phosphatase: 95 U/L (ref 40–150)
BILIRUBIN TOTAL: 0.5 mg/dL (ref 0.20–1.20)
BUN: 9.1 mg/dL (ref 7.0–26.0)
CO2: 18 meq/L — AB (ref 22–29)
Calcium: 8.8 mg/dL (ref 8.4–10.4)
Chloride: 107 mEq/L (ref 98–109)
Creatinine: 0.7 mg/dL (ref 0.6–1.1)
GLUCOSE: 153 mg/dL — AB (ref 70–140)
POTASSIUM: 3.8 meq/L (ref 3.5–5.1)
SODIUM: 140 meq/L (ref 136–145)
TOTAL PROTEIN: 6.3 g/dL — AB (ref 6.4–8.3)

## 2015-02-20 MED ORDER — FAMOTIDINE IN NACL 20-0.9 MG/50ML-% IV SOLN
20.0000 mg | Freq: Once | INTRAVENOUS | Status: AC
  Administered 2015-02-20: 20 mg via INTRAVENOUS

## 2015-02-20 MED ORDER — HEPARIN SOD (PORK) LOCK FLUSH 100 UNIT/ML IV SOLN
500.0000 [IU] | Freq: Once | INTRAVENOUS | Status: AC | PRN
  Administered 2015-02-20: 500 [IU]
  Filled 2015-02-20: qty 5

## 2015-02-20 MED ORDER — HEPARIN SOD (PORK) LOCK FLUSH 100 UNIT/ML IV SOLN
500.0000 [IU] | Freq: Once | INTRAVENOUS | Status: AC
  Administered 2015-02-20: 500 [IU] via INTRAVENOUS
  Filled 2015-02-20: qty 5

## 2015-02-20 MED ORDER — ACETAMINOPHEN 325 MG PO TABS
650.0000 mg | ORAL_TABLET | Freq: Once | ORAL | Status: AC
  Administered 2015-02-20: 650 mg via ORAL

## 2015-02-20 MED ORDER — SODIUM CHLORIDE 0.9 % IV SOLN
Freq: Once | INTRAVENOUS | Status: AC
  Administered 2015-02-20: 12:00:00 via INTRAVENOUS

## 2015-02-20 MED ORDER — FAMOTIDINE IN NACL 20-0.9 MG/50ML-% IV SOLN
INTRAVENOUS | Status: AC
  Filled 2015-02-20: qty 50

## 2015-02-20 MED ORDER — ACETAMINOPHEN 325 MG PO TABS
ORAL_TABLET | ORAL | Status: AC
  Filled 2015-02-20: qty 2

## 2015-02-20 MED ORDER — SODIUM CHLORIDE 0.9 % IV SOLN
10.0000 mg/kg | Freq: Once | INTRAVENOUS | Status: AC
  Administered 2015-02-20: 600 mg via INTRAVENOUS
  Filled 2015-02-20: qty 24

## 2015-02-20 MED ORDER — DIPHENHYDRAMINE HCL 25 MG PO CAPS
50.0000 mg | ORAL_CAPSULE | Freq: Once | ORAL | Status: AC
  Administered 2015-02-20: 50 mg via ORAL

## 2015-02-20 MED ORDER — SODIUM CHLORIDE 0.9 % IV SOLN
Freq: Once | INTRAVENOUS | Status: AC
  Administered 2015-02-20: 12:00:00 via INTRAVENOUS
  Filled 2015-02-20: qty 4

## 2015-02-20 MED ORDER — SODIUM CHLORIDE 0.9 % IJ SOLN
10.0000 mL | INTRAMUSCULAR | Status: DC | PRN
  Administered 2015-02-20: 10 mL via INTRAVENOUS
  Filled 2015-02-20: qty 10

## 2015-02-20 MED ORDER — DIPHENHYDRAMINE HCL 25 MG PO CAPS
ORAL_CAPSULE | ORAL | Status: AC
  Filled 2015-02-20: qty 2

## 2015-02-20 MED ORDER — SODIUM CHLORIDE 0.9 % IJ SOLN
10.0000 mL | INTRAMUSCULAR | Status: DC | PRN
  Administered 2015-02-20: 10 mL
  Filled 2015-02-20: qty 10

## 2015-02-20 NOTE — Patient Instructions (Signed)
[facility] Discharge Instructions for Patients Receiving Chemotherapy  Today you received the following chemotherapy agents: Elotuzumab  To help prevent nausea and vomiting after your treatment, we encourage you to take your nausea medication as prescribed by your physician.    If you develop nausea and vomiting that is not controlled by your nausea medication, call the clinic.   BELOW ARE SYMPTOMS THAT SHOULD BE REPORTED IMMEDIATELY:  *FEVER GREATER THAN 100.5 F  *CHILLS WITH OR WITHOUT FEVER  NAUSEA AND VOMITING THAT IS NOT CONTROLLED WITH YOUR NAUSEA MEDICATION  *UNUSUAL SHORTNESS OF BREATH  *UNUSUAL BRUISING OR BLEEDING  TENDERNESS IN MOUTH AND THROAT WITH OR WITHOUT PRESENCE OF ULCERS  *URINARY PROBLEMS  *BOWEL PROBLEMS  UNUSUAL RASH Items with * indicate a potential emergency and should be followed up as soon as possible.  Feel free to call the clinic you have any questions or concerns. The clinic phone number is [phone].  Please show the [place] at check-in to the Emergency Department and triage nurse.

## 2015-02-20 NOTE — Patient Instructions (Signed)

## 2015-02-22 ENCOUNTER — Other Ambulatory Visit: Payer: Self-pay | Admitting: Family Medicine

## 2015-02-22 LAB — SPEP & IFE WITH QIG
ALPHA-2-GLOBULIN: 0.8 g/dL (ref 0.5–0.9)
Albumin ELP: 3.8 g/dL (ref 3.8–4.8)
Alpha-1-Globulin: 0.4 g/dL — ABNORMAL HIGH (ref 0.2–0.3)
Beta 2: 0.2 g/dL (ref 0.2–0.5)
Beta Globulin: 0.4 g/dL (ref 0.4–0.6)
Gamma Globulin: 0.6 g/dL — ABNORMAL LOW (ref 0.8–1.7)
IGM, SERUM: 29 mg/dL — AB (ref 52–322)
IgA: 39 mg/dL — ABNORMAL LOW (ref 69–380)
IgG (Immunoglobin G), Serum: 639 mg/dL — ABNORMAL LOW (ref 690–1700)
TOTAL PROTEIN, SERUM ELECTROPHOR: 6.1 g/dL (ref 6.1–8.1)

## 2015-02-22 LAB — KAPPA/LAMBDA LIGHT CHAINS
KAPPA FREE LGHT CHN: 11.8 mg/dL — AB (ref 0.33–1.94)
Kappa:Lambda Ratio: 10.63 — ABNORMAL HIGH (ref 0.26–1.65)
Lambda Free Lght Chn: 1.11 mg/dL (ref 0.57–2.63)

## 2015-02-22 LAB — BETA 2 MICROGLOBULIN, SERUM: Beta-2 Microglobulin: 1.94 mg/L (ref <=2.51)

## 2015-03-06 ENCOUNTER — Encounter: Payer: Self-pay | Admitting: Hematology and Oncology

## 2015-03-06 ENCOUNTER — Ambulatory Visit (HOSPITAL_BASED_OUTPATIENT_CLINIC_OR_DEPARTMENT_OTHER): Payer: Medicare Other | Admitting: Hematology and Oncology

## 2015-03-06 ENCOUNTER — Telehealth: Payer: Self-pay | Admitting: Hematology and Oncology

## 2015-03-06 ENCOUNTER — Ambulatory Visit (HOSPITAL_BASED_OUTPATIENT_CLINIC_OR_DEPARTMENT_OTHER): Payer: Medicare Other

## 2015-03-06 ENCOUNTER — Ambulatory Visit: Payer: Medicare Other

## 2015-03-06 ENCOUNTER — Other Ambulatory Visit: Payer: Self-pay | Admitting: *Deleted

## 2015-03-06 ENCOUNTER — Other Ambulatory Visit (HOSPITAL_BASED_OUTPATIENT_CLINIC_OR_DEPARTMENT_OTHER): Payer: Medicare Other

## 2015-03-06 VITALS — BP 133/73 | HR 71 | Temp 97.7°F | Resp 18 | Ht 64.0 in | Wt 126.6 lb

## 2015-03-06 VITALS — BP 112/62 | HR 68 | Temp 97.0°F | Resp 18

## 2015-03-06 DIAGNOSIS — Z5112 Encounter for antineoplastic immunotherapy: Secondary | ICD-10-CM | POA: Diagnosis not present

## 2015-03-06 DIAGNOSIS — C9000 Multiple myeloma not having achieved remission: Secondary | ICD-10-CM

## 2015-03-06 DIAGNOSIS — R5382 Chronic fatigue, unspecified: Secondary | ICD-10-CM

## 2015-03-06 DIAGNOSIS — Z95828 Presence of other vascular implants and grafts: Secondary | ICD-10-CM

## 2015-03-06 LAB — COMPREHENSIVE METABOLIC PANEL (CC13)
ALBUMIN: 3.6 g/dL (ref 3.5–5.0)
ALT: 15 U/L (ref 0–55)
AST: 16 U/L (ref 5–34)
Alkaline Phosphatase: 83 U/L (ref 40–150)
Anion Gap: 10 mEq/L (ref 3–11)
BUN: 9.9 mg/dL (ref 7.0–26.0)
CO2: 23 mEq/L (ref 22–29)
CREATININE: 0.7 mg/dL (ref 0.6–1.1)
Calcium: 8.9 mg/dL (ref 8.4–10.4)
Chloride: 108 mEq/L (ref 98–109)
EGFR: >90 mL/min/{1.73_m2} (ref >90)
GLUCOSE: 146 mg/dL — AB (ref 70–140)
POTASSIUM: 3.8 meq/L (ref 3.5–5.1)
Sodium: 140 mEq/L (ref 136–145)
Total Bilirubin: 0.56 mg/dL (ref 0.20–1.20)
Total Protein: 6.2 g/dL — ABNORMAL LOW (ref 6.4–8.3)

## 2015-03-06 LAB — CBC WITH DIFFERENTIAL/PLATELET
BASO%: 1.2 % (ref 0.0–2.0)
Basophils Absolute: 0 10*3/uL (ref 0.0–0.1)
EOS%: 10.6 % — AB (ref 0.0–7.0)
Eosinophils Absolute: 0.4 10*3/uL (ref 0.0–0.5)
HCT: 42.2 % (ref 34.8–46.6)
HGB: 14 g/dL (ref 11.6–15.9)
LYMPH%: 15.3 % (ref 14.0–49.7)
MCH: 30.9 pg (ref 25.1–34.0)
MCHC: 33.2 g/dL (ref 31.5–36.0)
MCV: 93.1 fL (ref 79.5–101.0)
MONO#: 0.4 10*3/uL (ref 0.1–0.9)
MONO%: 10 % (ref 0.0–14.0)
NEUT#: 2.4 10*3/uL (ref 1.5–6.5)
NEUT%: 62.9 % (ref 38.4–76.8)
PLATELETS: 190 10*3/uL (ref 145–400)
RBC: 4.53 10*6/uL (ref 3.70–5.45)
RDW: 16.7 % — ABNORMAL HIGH (ref 11.2–14.5)
WBC: 3.9 10*3/uL (ref 3.9–10.3)
lymph#: 0.6 10*3/uL — ABNORMAL LOW (ref 0.9–3.3)

## 2015-03-06 MED ORDER — DIPHENHYDRAMINE HCL 25 MG PO CAPS
50.0000 mg | ORAL_CAPSULE | Freq: Once | ORAL | Status: AC
  Administered 2015-03-06: 50 mg via ORAL

## 2015-03-06 MED ORDER — SODIUM CHLORIDE 0.9 % IV SOLN
Freq: Once | INTRAVENOUS | Status: AC
  Administered 2015-03-06: 12:00:00 via INTRAVENOUS

## 2015-03-06 MED ORDER — DIPHENHYDRAMINE HCL 25 MG PO CAPS
ORAL_CAPSULE | ORAL | Status: AC
  Filled 2015-03-06: qty 1

## 2015-03-06 MED ORDER — SODIUM CHLORIDE 0.9 % IV SOLN
Freq: Once | INTRAVENOUS | Status: AC
  Administered 2015-03-06: 13:00:00 via INTRAVENOUS
  Filled 2015-03-06: qty 4

## 2015-03-06 MED ORDER — FAMOTIDINE IN NACL 20-0.9 MG/50ML-% IV SOLN
INTRAVENOUS | Status: AC
  Filled 2015-03-06: qty 50

## 2015-03-06 MED ORDER — HEPARIN SOD (PORK) LOCK FLUSH 100 UNIT/ML IV SOLN
500.0000 [IU] | Freq: Once | INTRAVENOUS | Status: AC | PRN
  Administered 2015-03-06: 500 [IU]
  Filled 2015-03-06: qty 5

## 2015-03-06 MED ORDER — FAMOTIDINE IN NACL 20-0.9 MG/50ML-% IV SOLN
20.0000 mg | Freq: Once | INTRAVENOUS | Status: AC
  Administered 2015-03-06: 20 mg via INTRAVENOUS

## 2015-03-06 MED ORDER — SODIUM CHLORIDE 0.9 % IJ SOLN
10.0000 mL | INTRAMUSCULAR | Status: DC | PRN
  Administered 2015-03-06: 10 mL
  Filled 2015-03-06: qty 10

## 2015-03-06 MED ORDER — SODIUM CHLORIDE 0.9 % IJ SOLN
10.0000 mL | INTRAMUSCULAR | Status: DC | PRN
  Administered 2015-03-06: 10 mL via INTRAVENOUS
  Filled 2015-03-06: qty 10

## 2015-03-06 MED ORDER — SODIUM CHLORIDE 0.9 % IV SOLN
10.0000 mg/kg | Freq: Once | INTRAVENOUS | Status: AC
  Administered 2015-03-06: 600 mg via INTRAVENOUS
  Filled 2015-03-06: qty 24

## 2015-03-06 MED ORDER — ACETAMINOPHEN 325 MG PO TABS
650.0000 mg | ORAL_TABLET | Freq: Once | ORAL | Status: AC
  Administered 2015-03-06: 650 mg via ORAL

## 2015-03-06 MED ORDER — LENALIDOMIDE 25 MG PO CAPS: ORAL_CAPSULE | ORAL | Status: DC

## 2015-03-06 MED ORDER — ACETAMINOPHEN 325 MG PO TABS
ORAL_TABLET | ORAL | Status: AC
  Filled 2015-03-06: qty 2

## 2015-03-06 NOTE — Telephone Encounter (Signed)
Gave avs & calendar for June/July.  °

## 2015-03-06 NOTE — Patient Instructions (Signed)
[facility] Discharge Instructions for Patients Receiving Chemotherapy  Today you received the following chemotherapy agents Elotuzumab  To help prevent nausea and vomiting after your treatment, we encourage you to take your nausea medication Compazine 10 mg every 6 hours   If you develop nausea and vomiting that is not controlled by your nausea medication, call the clinic.   BELOW ARE SYMPTOMS THAT SHOULD BE REPORTED IMMEDIATELY:  *FEVER GREATER THAN 100.5 F  *CHILLS WITH OR WITHOUT FEVER  NAUSEA AND VOMITING THAT IS NOT CONTROLLED WITH YOUR NAUSEA MEDICATION  *UNUSUAL SHORTNESS OF BREATH  *UNUSUAL BRUISING OR BLEEDING  TENDERNESS IN MOUTH AND THROAT WITH OR WITHOUT PRESENCE OF ULCERS  *URINARY PROBLEMS  *BOWEL PROBLEMS  UNUSUAL RASH Items with * indicate a potential emergency and should be followed up as soon as possible.  Feel free to call the clinic you have any questions or concerns. The clinic phone number is [phone].  Please show the [place] at check-in to the Emergency Department and triage nurse.

## 2015-03-06 NOTE — Patient Instructions (Signed)

## 2015-03-07 ENCOUNTER — Encounter: Payer: Self-pay | Admitting: Hematology and Oncology

## 2015-03-07 DIAGNOSIS — R5382 Chronic fatigue, unspecified: Secondary | ICD-10-CM

## 2015-03-07 HISTORY — DX: Chronic fatigue, unspecified: R53.82

## 2015-03-07 NOTE — Progress Notes (Signed)
[facility] [facility] OFFICE PROGRESS NOTE  Patient Care Team: Marin Olp, MD as PCP - General (Family Medicine) Jeanann Lewandowsky, MD as Consulting Physician (Medical Oncology) Marin Olp, MD as Consulting Physician (Family Medicine) Heath Lark, MD as Consulting Physician (Hematology and Oncology)  SUMMARY OF ONCOLOGIC HISTORY: Oncology History   Multiple myeloma, kappa light chain disease, Durie-Salmon stage III     Multiple myeloma   07/16/2006 Bone Marrow Biopsy BM biopsy is non-diagnostic   09/21/2006 Procedure L5 vertebral biopsy 5% plasma cell   10/25/2006 Bone Marrow Biopsy BM biopsy is hypercellular with 5% plasma cell   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   01/03/2007 -  Radiation Therapy Approximate date only, received RT for plasmacytoma followed by surgery   09/14/2007 Initial Diagnosis MULTIPLE  MYELOMA   10/05/2007 Bone Marrow Biopsy Bm biopsy was negative   06/18/2008 Bone Marrow Biopsy BM biopsy was negative   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 [facility]   04/13/2011 Relapse/Recurrence Disease relapse   04/14/2011 Bone Marrow Biopsy Bm biopsy showed 2 % plasma cell   04/27/2011 Relapse/Recurrence Disease relapse, treated with Velcade/Cytoxan/Dex   09/07/2011 - 07/28/2013 Chemotherapy Kendra Johns has been receiving Velcade   12/27/2011 Bone Marrow Transplant 2nd transplant at [facility]   07/28/2013 Relapse/Recurrence Chemo is stopped due to progression of disease   08/29/2013 Imaging PEt/CT showed recurrence of disease with new lesion on her rib with compression fracture   09/13/2013 Bone Marrow Biopsy BM biopsy is hypercellular with 6% plasma cell   10/10/2013 - 10/20/2013 Radiation Therapy Started on palliative XRT for rib pain   11/27/2013 - 04/06/2014 Chemotherapy The patient starts chemotherapy with Carfilzomib   07/19/2014 Imaging Repeat bloodwork and PET/CT scan shows significant disease progression.   08/02/2014 -  Chemotherapy Kendra Johns  enrolled in clinical trial using combination therapy with Revlimid, dexamethasone and Elotuzumab    INTERVAL HISTORY: Please see below for problem oriented charting. Kendra Johns returns for further follow-up. Kendra Johns complained of profound fatigue. Kendra Johns denies recent infection. Denies recent cough. Kendra Johns has not made an appointment to see a dentist.  REVIEW OF SYSTEMS:   Constitutional: Denies fevers, chills or abnormal weight loss Eyes: Denies blurriness of vision Ears, nose, mouth, throat, and face: Denies mucositis or sore throat Respiratory: Denies cough, dyspnea or wheezes Cardiovascular: Denies palpitation, chest discomfort or lower extremity swelling Gastrointestinal:  Denies nausea, heartburn or change in bowel habits Skin: Denies abnormal skin rashes Lymphatics: Denies new lymphadenopathy or easy bruising Neurological:Denies numbness, tingling or new weaknesses Behavioral/Psych: Mood is stable, no new changes  All other systems were reviewed with the patient and are negative.  I have reviewed the past medical history, past surgical history, social history and family history with the patient and they are unchanged from previous note.  ALLERGIES:  is allergic to codeine; ibuprofen; and albuterol.  MEDICATIONS:  Current Outpatient Prescriptions  Medication Sig Dispense Refill  . Alum & Mag Hydroxide-Simeth (MAGIC MOUTHWASH) SOLN Take 10 mLs by mouth 4 (four) times daily as needed for mouth pain. Swish and spit  Or  Swish and swallow. 300 mL 2  . aspirin 81 MG tablet Take 81 mg by mouth daily.    . Cholecalciferol (VITAMIN D3) 5000 UNITS TABS Take 5,000 mg by mouth every morning. 30 tablet 0  . cyclobenzaprine (FLEXERIL) 10 MG tablet Take 1 tablet (10 mg total) by mouth 3 (three) times daily as needed for muscle spasms. 60 tablet 0  . dexamethasone (DECADRON) 4 MG tablet  Take 16 mg by mouth once a week.    Marland Kitchen ipratropium (ATROVENT HFA) 17 MCG/ACT inhaler Inhale 2 puffs into the lungs every 4  (four) hours as needed for wheezing. 1 Inhaler 12  . lidocaine (LIDODERM) 5 % Place 1 patch onto the skin daily as needed (pain). Remove & Discard patch within 12 hours or as directed by MD    . lidocaine-prilocaine (EMLA) cream Apply 1 application topically as needed. (Patient taking differently: Apply 1 application topically daily as needed. Port access) 30 g 0  . losartan (COZAAR) 50 MG tablet TAKE 1/2 TO 1 TABLET DAILY 30 tablet 4  . magnesium hydroxide (MILK OF MAGNESIA) 400 MG/5ML suspension Take 30 mLs by mouth daily as needed for mild constipation (constipation).     . morphine (MS CONTIN) 60 MG 12 hr tablet Take 60 mg by mouth every 12 (twelve) hours.    Marland Kitchen morphine (MSIR) 30 MG tablet Take 30 mg by mouth every 4 (four) hours as needed for severe pain (pain).     . prochlorperazine (COMPAZINE) 10 MG tablet Take 1 tablet (10 mg total) by mouth every 6 (six) hours as needed for nausea. 60 tablet 3  . lenalidomide (REVLIMID) 25 MG capsule TAKE ONE CAPSULE BY MOUTH EVERY DAY FOR 21 DAYS THEN 7 DAYS OFF. 21 capsule 0  . [DISCONTINUED] amLODipine (NORVASC) 5 MG tablet Take 1 tablet (5 mg total) by mouth daily. 90 tablet 3  . [DISCONTINUED] gabapentin (NEURONTIN) 600 MG tablet Take 600 mg by mouth 3 (three) times daily.       No current facility-administered medications for this visit.   Facility-Administered Medications Ordered in Other Visits  Medication Dose Route Frequency Provider Last Rate Last Dose  . sodium chloride 0.9 % injection 10 mL  10 mL Intravenous PRN Heath Lark, MD   10 mL at 02/26/14 1425  . sodium chloride 0.9 % injection 10 mL  10 mL Intravenous PRN Chauncey Cruel, MD   10 mL at 04/05/14 1333    PHYSICAL EXAMINATION: ECOG PERFORMANCE STATUS: 1 - Symptomatic but completely ambulatory  Filed Vitals:   03/06/15 1132  BP: 133/73  Pulse: 71  Temp: 97.7 F (36.5 C)  Resp: 18   Filed Weights   03/06/15 1132  Weight: 126 lb 9.6 oz (57.425 kg)    GENERAL:alert, no  distress and comfortable SKIN: skin color, texture, turgor are normal, no rashes or significant lesions EYES: normal, Conjunctiva are pink and non-injected, sclera clear OROPHARYNX:no exudate, no erythema and lips, buccal mucosa, and tongue normal  NECK: supple, thyroid normal size, non-tender, without nodularity LYMPH:  no palpable lymphadenopathy in the cervical, axillary or inguinal LUNGS: clear to auscultation and percussion with normal breathing effort HEART: regular rate & rhythm and no murmurs and no lower extremity edema ABDOMEN:abdomen soft, non-tender and normal bowel sounds Musculoskeletal:no cyanosis of digits and no clubbing  NEURO: alert & oriented x 3 with fluent speech, no focal motor/sensory deficits  LABORATORY DATA:  I have reviewed the data as listed    Component Value Date/Time   NA 140 03/06/2015 1113   NA 140 12/28/2014 1933   K 3.8 03/06/2015 1113   K 3.8 12/28/2014 1933   CL 109 12/28/2014 1933   CL 107 03/10/2013 1014   CO2 23 03/06/2015 1113   CO2 24 12/28/2014 1933   GLUCOSE 146* 03/06/2015 1113   GLUCOSE 144* 12/28/2014 1933   GLUCOSE 111* 03/10/2013 1014   GLUCOSE 98 08/31/2006 1024  BUN 9.9 03/06/2015 1113   BUN 14 12/28/2014 1933   CREATININE 0.7 03/06/2015 1113   CREATININE 0.67 12/28/2014 1933   CALCIUM 8.9 03/06/2015 1113   CALCIUM 8.8 12/28/2014 1933   PROT 6.2* 03/06/2015 1113   PROT 6.6 09/11/2014 0336   ALBUMIN 3.6 03/06/2015 1113   ALBUMIN 3.4* 09/11/2014 0336   AST 16 03/06/2015 1113   AST 18 09/11/2014 0336   ALT 15 03/06/2015 1113   ALT 18 09/11/2014 0336   ALKPHOS 83 03/06/2015 1113   ALKPHOS 113 09/11/2014 0336   BILITOT 0.56 03/06/2015 1113   BILITOT 0.6 09/11/2014 0336   GFRNONAA >90 12/28/2014 1933   GFRAA >90 12/28/2014 1933    No results found for: SPEP, UPEP  Lab Results  Component Value Date   WBC 3.9 03/06/2015   NEUTROABS 2.4 03/06/2015   HGB 14.0 03/06/2015   HCT 42.2 03/06/2015   MCV 93.1 03/06/2015    PLT 190 03/06/2015      Chemistry      Component Value Date/Time   NA 140 03/06/2015 1113   NA 140 12/28/2014 1933   K 3.8 03/06/2015 1113   K 3.8 12/28/2014 1933   CL 109 12/28/2014 1933   CL 107 03/10/2013 1014   CO2 23 03/06/2015 1113   CO2 24 12/28/2014 1933   BUN 9.9 03/06/2015 1113   BUN 14 12/28/2014 1933   CREATININE 0.7 03/06/2015 1113   CREATININE 0.67 12/28/2014 1933      Component Value Date/Time   CALCIUM 8.9 03/06/2015 1113   CALCIUM 8.8 12/28/2014 1933   ALKPHOS 83 03/06/2015 1113   ALKPHOS 113 09/11/2014 0336   AST 16 03/06/2015 1113   AST 18 09/11/2014 0336   ALT 15 03/06/2015 1113   ALT 18 09/11/2014 0336   BILITOT 0.56 03/06/2015 1113   BILITOT 0.6 09/11/2014 0336      ASSESSMENT & PLAN:  Multiple myeloma Overall, Kendra Johns tolerated the treatment fairly well apart from profound fatigue. Her most recent blood work suggests that Kendra Johns has achieved excellent response to treatment. The patient desired chemotherapy holiday. Previously, her chemotherapy holiday did not last more than 3 months with significant disease relapse. I am reluctant to recommend complete chemotherapy holiday. I am willing to defer treatment break from Elotuzumab but recommend Kendra Johns continues to take Revlimid regularly on 21 days on, 7 days off cycle. Kendra Johns agreed with the plan of care. I will bring her back every 6 weeks for port flush and blood work monitoring. As we will see her back in 3 months with history, physical examination, and blood work.   Chronic fatigue Kendra Johns complained of chronic fatigue. Certainly, this could be related to her treatment. The patient is also taking a lot of pain medicine. I will check her thyroid function tests in her next blood draw to make sure that we were not dealing with hypothyroidism.    Orders Placed This Encounter  Procedures  . SPEP & IFE with QIG    Standing Status: Standing     Number of Occurrences: 3     Standing Expiration Date: 03/05/2016   . Kappa/lambda light chains    Standing Status: Standing     Number of Occurrences: 3     Standing Expiration Date: 03/05/2016  . Beta 2 microglobulin, serum    Standing Status: Standing     Number of Occurrences: 3     Standing Expiration Date: 03/05/2016  . TSH    Standing Status: Future  Number of Occurrences:      Standing Expiration Date: 04/10/2016   All questions were answered. The patient knows to call the clinic with any problems, questions or concerns. No barriers to learning was detected. I spent 25 minutes counseling the patient face to face. The total time spent in the appointment was 30 minutes and more than 50% was on counseling and review of test results     [facility], Kendra Garmany, MD 03/07/2015 8:16 AM

## 2015-03-07 NOTE — Assessment & Plan Note (Signed)
Overall, she tolerated the treatment fairly well apart from profound fatigue. Her most recent blood work suggests that she has achieved excellent response to treatment. The patient desired chemotherapy holiday. Previously, her chemotherapy holiday did not last more than 3 months with significant disease relapse. I am reluctant to recommend complete chemotherapy holiday. I am willing to defer treatment break from Elotuzumab but recommend she continues to take Revlimid regularly on 21 days on, 7 days off cycle. She agreed with the plan of care. I will bring her back every 6 weeks for port flush and blood work monitoring. As we will see her back in 3 months with history, physical examination, and blood work.

## 2015-03-07 NOTE — Assessment & Plan Note (Signed)
She complained of chronic fatigue. Certainly, this could be related to her treatment. The patient is also taking a lot of pain medicine. I will check her thyroid function tests in her next blood draw to make sure that we were not dealing with hypothyroidism.

## 2015-03-08 ENCOUNTER — Telehealth: Payer: Self-pay | Admitting: *Deleted

## 2015-03-08 NOTE — Telephone Encounter (Signed)
Pt left VM states she needs refill on Revlimid.   Called pt back and left her VM that Revlimid refill was faxed to [facility] yesterday and to check w/ pharmacy.  Call us back if any problems.

## 2015-03-11 DIAGNOSIS — Z79891 Long term (current) use of opiate analgesic: Secondary | ICD-10-CM | POA: Diagnosis not present

## 2015-03-11 DIAGNOSIS — M545 Low back pain: Secondary | ICD-10-CM | POA: Diagnosis not present

## 2015-03-11 DIAGNOSIS — G893 Neoplasm related pain (acute) (chronic): Secondary | ICD-10-CM | POA: Diagnosis not present

## 2015-03-11 DIAGNOSIS — G894 Chronic pain syndrome: Secondary | ICD-10-CM | POA: Diagnosis not present

## 2015-03-20 ENCOUNTER — Ambulatory Visit: Payer: Self-pay

## 2015-03-20 ENCOUNTER — Other Ambulatory Visit: Payer: Self-pay

## 2015-04-02 ENCOUNTER — Telehealth: Payer: Self-pay | Admitting: *Deleted

## 2015-04-02 NOTE — Telephone Encounter (Signed)
[facility] faxed Revlimid refill request.  Request to provider's desk/in-basket for review.

## 2015-04-03 ENCOUNTER — Other Ambulatory Visit: Payer: Self-pay | Admitting: *Deleted

## 2015-04-03 DIAGNOSIS — C9000 Multiple myeloma not having achieved remission: Secondary | ICD-10-CM

## 2015-04-03 MED ORDER — LENALIDOMIDE 25 MG PO CAPS: ORAL_CAPSULE | ORAL | Status: DC

## 2015-04-08 DIAGNOSIS — G894 Chronic pain syndrome: Secondary | ICD-10-CM | POA: Diagnosis not present

## 2015-04-08 DIAGNOSIS — Z79891 Long term (current) use of opiate analgesic: Secondary | ICD-10-CM | POA: Diagnosis not present

## 2015-04-08 DIAGNOSIS — M545 Low back pain: Secondary | ICD-10-CM | POA: Diagnosis not present

## 2015-04-08 DIAGNOSIS — K5900 Constipation, unspecified: Secondary | ICD-10-CM | POA: Diagnosis not present

## 2015-04-17 ENCOUNTER — Telehealth: Payer: Self-pay | Admitting: *Deleted

## 2015-04-17 ENCOUNTER — Ambulatory Visit: Payer: Medicare Other

## 2015-04-17 ENCOUNTER — Other Ambulatory Visit (HOSPITAL_BASED_OUTPATIENT_CLINIC_OR_DEPARTMENT_OTHER): Payer: Medicare Other

## 2015-04-17 VITALS — BP 130/92 | HR 79 | Temp 97.9°F | Resp 18

## 2015-04-17 DIAGNOSIS — C9000 Multiple myeloma not having achieved remission: Secondary | ICD-10-CM

## 2015-04-17 DIAGNOSIS — R5382 Chronic fatigue, unspecified: Secondary | ICD-10-CM

## 2015-04-17 DIAGNOSIS — Z95828 Presence of other vascular implants and grafts: Secondary | ICD-10-CM

## 2015-04-17 DIAGNOSIS — C9002 Multiple myeloma in relapse: Secondary | ICD-10-CM | POA: Diagnosis not present

## 2015-04-17 LAB — COMPREHENSIVE METABOLIC PANEL (CC13)
ALK PHOS: 86 U/L (ref 40–150)
ALT: 10 U/L (ref 0–55)
AST: 14 U/L (ref 5–34)
Albumin: 3.4 g/dL — ABNORMAL LOW (ref 3.5–5.0)
Anion Gap: 11 mEq/L (ref 3–11)
BUN: 10.3 mg/dL (ref 7.0–26.0)
CALCIUM: 8.8 mg/dL (ref 8.4–10.4)
CO2: 22 mEq/L (ref 22–29)
Chloride: 107 mEq/L (ref 98–109)
Creatinine: 0.8 mg/dL (ref 0.6–1.1)
EGFR: >90 mL/min/{1.73_m2} (ref >90)
Glucose: 103 mg/dl (ref 70–140)
Potassium: 3.5 mEq/L (ref 3.5–5.1)
Sodium: 140 mEq/L (ref 136–145)
Total Bilirubin: 0.54 mg/dL (ref 0.20–1.20)
Total Protein: 6.3 g/dL — ABNORMAL LOW (ref 6.4–8.3)

## 2015-04-17 LAB — CBC WITH DIFFERENTIAL/PLATELET
BASO%: 1.9 % (ref 0.0–2.0)
Basophils Absolute: 0.1 10*3/uL (ref 0.0–0.1)
EOS%: 7.9 % — ABNORMAL HIGH (ref 0.0–7.0)
Eosinophils Absolute: 0.3 10*3/uL (ref 0.0–0.5)
HCT: 40.1 % (ref 34.8–46.6)
HGB: 13.3 g/dL (ref 11.6–15.9)
LYMPH#: 0.9 10*3/uL (ref 0.9–3.3)
LYMPH%: 21.6 % (ref 14.0–49.7)
MCH: 30.5 pg (ref 25.1–34.0)
MCHC: 33.1 g/dL (ref 31.5–36.0)
MCV: 92 fL (ref 79.5–101.0)
MONO#: 0.4 10*3/uL (ref 0.1–0.9)
MONO%: 10.4 % (ref 0.0–14.0)
NEUT#: 2.5 10*3/uL (ref 1.5–6.5)
NEUT%: 58.2 % (ref 38.4–76.8)
Platelets: 168 10*3/uL (ref 145–400)
RBC: 4.35 10*6/uL (ref 3.70–5.45)
RDW: 16.3 % — AB (ref 11.2–14.5)
WBC: 4.2 10*3/uL (ref 3.9–10.3)

## 2015-04-17 LAB — TSH CHCC: TSH: 6.424 m(IU)/L — ABNORMAL HIGH (ref 0.308–3.960)

## 2015-04-17 MED ORDER — SODIUM CHLORIDE 0.9 % IJ SOLN
10.0000 mL | INTRAMUSCULAR | Status: DC | PRN
  Administered 2015-04-17: 10 mL via INTRAVENOUS
  Filled 2015-04-17: qty 10

## 2015-04-17 MED ORDER — HEPARIN SOD (PORK) LOCK FLUSH 100 UNIT/ML IV SOLN
500.0000 [IU] | Freq: Once | INTRAVENOUS | Status: AC
  Administered 2015-04-17: 500 [IU] via INTRAVENOUS
  Filled 2015-04-17: qty 5

## 2015-04-17 MED ORDER — LEVOTHYROXINE SODIUM 50 MCG PO TABS: 50.0000 ug | ORAL_TABLET | Freq: Every day | ORAL | Status: DC

## 2015-04-17 NOTE — Telephone Encounter (Signed)
Informed pt of abnormal TSH and new rx for levothyroxine sent to her pharmacy.  Instructed pt to take once daily and that abnormal thyroid functioning may be cause of her fatigue.  Pt verbalized understanding.    She also asked if she is to continue dexamethasone 4 tablets once weekly.  Dr. Alvy Bimler instructs pt to decrease dose by one tab each week until she is done.  Pt verbalized understanding.

## 2015-04-17 NOTE — Patient Instructions (Signed)

## 2015-04-17 NOTE — Telephone Encounter (Signed)
-----   Message from Heath Lark, MD sent at 04/17/2015  2:27 PM EDT ----- Regarding: abnormal TSH Could be cause of fatigue Please call patient and call in prescription levothyroxine 50 mcg daily PO, 30 days supply, 3 refills ----- Message -----    From: [facility] Interface    Sent: 04/17/2015  12:03 PM      To: Heath Lark, MD

## 2015-04-19 LAB — SPEP & IFE WITH QIG
ALBUMIN ELP: 3.5 g/dL — AB (ref 3.8–4.8)
Alpha-1-Globulin: 0.5 g/dL — ABNORMAL HIGH (ref 0.2–0.3)
Alpha-2-Globulin: 0.9 g/dL (ref 0.5–0.9)
Beta 2: 0.3 g/dL (ref 0.2–0.5)
Beta Globulin: 0.4 g/dL (ref 0.4–0.6)
Gamma Globulin: 0.6 g/dL — ABNORMAL LOW (ref 0.8–1.7)
IGA: 55 mg/dL — AB (ref 69–380)
IgG (Immunoglobin G), Serum: 740 mg/dL (ref 690–1700)
IgM, Serum: 33 mg/dL — ABNORMAL LOW (ref 52–322)
Total Protein, Serum Electrophoresis: 6.2 g/dL (ref 6.1–8.1)

## 2015-04-19 LAB — KAPPA/LAMBDA LIGHT CHAINS
KAPPA FREE LGHT CHN: 35.7 mg/dL — AB (ref 0.33–1.94)
Kappa:Lambda Ratio: 27.25 — ABNORMAL HIGH (ref 0.26–1.65)
Lambda Free Lght Chn: 1.31 mg/dL (ref 0.57–2.63)

## 2015-04-19 LAB — BETA 2 MICROGLOBULIN, SERUM: Beta-2 Microglobulin: 2.23 mg/L (ref <=2.51)

## 2015-05-01 ENCOUNTER — Other Ambulatory Visit: Payer: Self-pay | Admitting: *Deleted

## 2015-05-01 DIAGNOSIS — C9000 Multiple myeloma not having achieved remission: Secondary | ICD-10-CM

## 2015-05-01 MED ORDER — LENALIDOMIDE 25 MG PO CAPS: ORAL_CAPSULE | ORAL | Status: DC

## 2015-05-02 ENCOUNTER — Telehealth: Payer: Self-pay | Admitting: *Deleted

## 2015-05-02 NOTE — Telephone Encounter (Signed)
I signed most revlimid prescriptions recently I'm wondering whether she is asking assistance from [place]?

## 2015-05-02 NOTE — Telephone Encounter (Signed)
Pt called requesting a call back from nurse.  Spoke with pt and was informed re:  Pt was contacted by a charity organization yesterday to help with pt's medications ( pt could not remember name ).   Pt wanted to let Cameo, desk nurse know to help with paperwork when forms are faxed to office.   Pt also requested refill of Revlimid -  Her week off started  05/01/15. Pt's  Phone    [phone].

## 2015-05-03 ENCOUNTER — Telehealth: Payer: Self-pay | Admitting: *Deleted

## 2015-05-03 NOTE — Telephone Encounter (Signed)
Patient [place] pay relief form completed and signed by Dr. Alvy Bimler then placed in mail box for [employer] in Managed care Dept. Notified pt and she verbalized understanding.

## 2015-05-08 DIAGNOSIS — M545 Low back pain: Secondary | ICD-10-CM | POA: Diagnosis not present

## 2015-05-08 DIAGNOSIS — G894 Chronic pain syndrome: Secondary | ICD-10-CM | POA: Diagnosis not present

## 2015-05-08 DIAGNOSIS — K5900 Constipation, unspecified: Secondary | ICD-10-CM | POA: Diagnosis not present

## 2015-05-08 DIAGNOSIS — Z79891 Long term (current) use of opiate analgesic: Secondary | ICD-10-CM | POA: Diagnosis not present

## 2015-05-22 ENCOUNTER — Telehealth: Payer: Self-pay | Admitting: *Deleted

## 2015-05-22 NOTE — Telephone Encounter (Signed)
VM from Kendra Johns w/ Patient Kendra Johns regarding Co-pay Relief.  She need form faxed back to her as soon as possible.  I forwarded this VM to Gaspar Bidding in managed care dept since [place] out of office today.

## 2015-05-23 ENCOUNTER — Other Ambulatory Visit: Payer: Self-pay | Admitting: Hematology and Oncology

## 2015-05-23 ENCOUNTER — Telehealth: Payer: Self-pay | Admitting: *Deleted

## 2015-05-23 DIAGNOSIS — E559 Vitamin D deficiency, unspecified: Secondary | ICD-10-CM

## 2015-05-23 DIAGNOSIS — C9000 Multiple myeloma not having achieved remission: Secondary | ICD-10-CM

## 2015-05-23 NOTE — Telephone Encounter (Signed)
Tell her I placed POF for labs tomorrow & MR urgent next week before she comes back She needs to bed rest, apply heat pad or see her PCP/doctor who prescribed her pain medications for adjustment until I see her back

## 2015-05-23 NOTE — Telephone Encounter (Signed)
Informed pt of Lab to be scheduled for tomorrow and MRI next week.  She says they already called her about MRI and it is not until 8/1.   I will call to see if it can be done any sooner. I will call pt back tomorrow morning.  She verbalized understanding.

## 2015-05-23 NOTE — Telephone Encounter (Signed)
THE PAST WEEK PT.'S LOWER BACK PAIN IS ALSO MOVING UP THE RIGHT SIDE OF HER BACK. THE PAIN IS GREATER THAN A TEN ON THE PAIN SCALE. PT. IS TAKING THE MS CONTIN EVERY EIGHT HOURS AND THE MSIR ALMOST EVERY THREE. THE PAIN STAYS ABOVE A TEN BUT PT. CAN MOVE BETTER. PT. HAS AN APPOINTMENT TO SEE DR.GORSUCH ON 05/29/15 OR PT. WANTS TO KNOW IF SHE SHOULD COME TO SEE DR.GORSUCH EARLIER. ALSO LEFT A MESSAGE ON DARLENA CLARK'S VOICE MAIL TO CALL PT. CONCERNING HER FORM FOR THE PATIENT [facility].

## 2015-05-24 ENCOUNTER — Telehealth: Payer: Self-pay | Admitting: Hematology and Oncology

## 2015-05-24 ENCOUNTER — Telehealth: Payer: Self-pay | Admitting: *Deleted

## 2015-05-24 ENCOUNTER — Ambulatory Visit: Payer: Medicare Other

## 2015-05-24 ENCOUNTER — Other Ambulatory Visit (HOSPITAL_BASED_OUTPATIENT_CLINIC_OR_DEPARTMENT_OTHER): Payer: Medicare Other

## 2015-05-24 VITALS — BP 115/99 | HR 79

## 2015-05-24 DIAGNOSIS — E559 Vitamin D deficiency, unspecified: Secondary | ICD-10-CM | POA: Diagnosis not present

## 2015-05-24 DIAGNOSIS — C9000 Multiple myeloma not having achieved remission: Secondary | ICD-10-CM

## 2015-05-24 DIAGNOSIS — Z95828 Presence of other vascular implants and grafts: Secondary | ICD-10-CM

## 2015-05-24 LAB — CBC WITH DIFFERENTIAL/PLATELET
BASO%: 0.9 % (ref 0.0–2.0)
Basophils Absolute: 0 10*3/uL (ref 0.0–0.1)
EOS ABS: 0.5 10*3/uL (ref 0.0–0.5)
EOS%: 14.8 % — ABNORMAL HIGH (ref 0.0–7.0)
HCT: 39.6 % (ref 34.8–46.6)
HGB: 13.5 g/dL (ref 11.6–15.9)
LYMPH%: 29.8 % (ref 14.0–49.7)
MCH: 31 pg (ref 25.1–34.0)
MCHC: 34.1 g/dL (ref 31.5–36.0)
MCV: 90.8 fL (ref 79.5–101.0)
MONO#: 0.5 10*3/uL (ref 0.1–0.9)
MONO%: 13.4 % (ref 0.0–14.0)
NEUT#: 1.5 10*3/uL (ref 1.5–6.5)
NEUT%: 41.1 % (ref 38.4–76.8)
PLATELETS: 143 10*3/uL — AB (ref 145–400)
RBC: 4.36 10*6/uL (ref 3.70–5.45)
RDW: 15.9 % — ABNORMAL HIGH (ref 11.2–14.5)
WBC: 3.5 10*3/uL — AB (ref 3.9–10.3)
lymph#: 1.1 10*3/uL (ref 0.9–3.3)

## 2015-05-24 MED ORDER — SODIUM CHLORIDE 0.9 % IJ SOLN
10.0000 mL | INTRAMUSCULAR | Status: DC | PRN
  Administered 2015-05-24: 10 mL via INTRAVENOUS
  Filled 2015-05-24: qty 10

## 2015-05-24 MED ORDER — HEPARIN SOD (PORK) LOCK FLUSH 100 UNIT/ML IV SOLN
500.0000 [IU] | Freq: Once | INTRAVENOUS | Status: AC
  Administered 2015-05-24: 500 [IU] via INTRAVENOUS
  Filled 2015-05-24: qty 5

## 2015-05-24 NOTE — Telephone Encounter (Signed)
s.w. pt and advised on todays appt....pt ok adn aware

## 2015-05-24 NOTE — Telephone Encounter (Signed)
Informed pt of MRI moved to Monday 7/25 at 7 pm.  She needs to arrive to [facility] [place] [place] at 6:45 pm on 7/25.   Pt verbalized understanding and aware of her appt for lab today.  She also understands we will cancel her lab for Wed and just keep appt w/ Dr. Alvy Bimler as scheduled for [facility] 7/27.

## 2015-05-24 NOTE — Patient Instructions (Signed)

## 2015-05-27 ENCOUNTER — Ambulatory Visit (HOSPITAL_COMMUNITY)
Admission: RE | Admit: 2015-05-27 | Discharge: 2015-05-27 | Disposition: A | Discharge status: Home / Self Care | Payer: Medicare Other | Source: Ambulatory Visit | Attending: Hematology and Oncology | Admitting: Hematology and Oncology

## 2015-05-27 DIAGNOSIS — M545 Low back pain: Secondary | ICD-10-CM | POA: Diagnosis not present

## 2015-05-27 DIAGNOSIS — C9000 Multiple myeloma not having achieved remission: Secondary | ICD-10-CM | POA: Insufficient documentation

## 2015-05-27 DIAGNOSIS — M4854XA Collapsed vertebra, not elsewhere classified, thoracic region, initial encounter for fracture: Secondary | ICD-10-CM | POA: Diagnosis not present

## 2015-05-27 MED ORDER — GADOBENATE DIMEGLUMINE 529 MG/ML IV SOLN
10.0000 mL | Freq: Once | INTRAVENOUS | Status: AC | PRN
  Administered 2015-05-27: 10 mL via INTRAVENOUS

## 2015-05-28 ENCOUNTER — Ambulatory Visit (HOSPITAL_BASED_OUTPATIENT_CLINIC_OR_DEPARTMENT_OTHER): Payer: Medicare Other | Admitting: Hematology and Oncology

## 2015-05-28 ENCOUNTER — Encounter: Payer: Self-pay | Admitting: Hematology and Oncology

## 2015-05-28 ENCOUNTER — Telehealth: Payer: Self-pay | Admitting: *Deleted

## 2015-05-28 ENCOUNTER — Telehealth: Payer: Self-pay | Admitting: Hematology and Oncology

## 2015-05-28 VITALS — BP 119/88 | HR 82 | Temp 98.3°F | Resp 18 | Ht 63.0 in | Wt 128.3 lb

## 2015-05-28 DIAGNOSIS — D701 Agranulocytosis secondary to cancer chemotherapy: Secondary | ICD-10-CM | POA: Diagnosis not present

## 2015-05-28 DIAGNOSIS — E559 Vitamin D deficiency, unspecified: Secondary | ICD-10-CM

## 2015-05-28 DIAGNOSIS — M8440XA Pathological fracture, unspecified site, initial encounter for fracture: Secondary | ICD-10-CM

## 2015-05-28 DIAGNOSIS — M545 Low back pain: Secondary | ICD-10-CM | POA: Diagnosis not present

## 2015-05-28 DIAGNOSIS — C9000 Multiple myeloma not having achieved remission: Secondary | ICD-10-CM | POA: Diagnosis present

## 2015-05-28 DIAGNOSIS — T451X5A Adverse effect of antineoplastic and immunosuppressive drugs, initial encounter: Secondary | ICD-10-CM

## 2015-05-28 HISTORY — DX: Vitamin D deficiency, unspecified: E55.9

## 2015-05-28 LAB — SPEP & IFE WITH QIG
ALBUMIN ELP: 3.8 g/dL (ref 3.8–4.8)
ALPHA-1-GLOBULIN: 0.4 g/dL — AB (ref 0.2–0.3)
Alpha-2-Globulin: 0.9 g/dL (ref 0.5–0.9)
Beta 2: 0.2 g/dL (ref 0.2–0.5)
Beta Globulin: 0.4 g/dL (ref 0.4–0.6)
Gamma Globulin: 0.7 g/dL — ABNORMAL LOW (ref 0.8–1.7)
IGA: 63 mg/dL — AB (ref 69–380)
IGG (IMMUNOGLOBIN G), SERUM: 763 mg/dL (ref 690–1700)
IGM, SERUM: 28 mg/dL — AB (ref 52–322)
TOTAL PROTEIN, SERUM ELECTROPHOR: 6.4 g/dL (ref 6.1–8.1)

## 2015-05-28 LAB — VITAMIN D 25 HYDROXY (VIT D DEFICIENCY, FRACTURES): Vit D, 25-Hydroxy: 14 ng/mL — ABNORMAL LOW (ref 30–100)

## 2015-05-28 LAB — KAPPA/LAMBDA LIGHT CHAINS
KAPPA FREE LGHT CHN: 55.7 mg/dL — AB (ref 0.33–1.94)
Kappa:Lambda Ratio: 72.34 — ABNORMAL HIGH (ref 0.26–1.65)
Lambda Free Lght Chn: 0.77 mg/dL (ref 0.57–2.63)

## 2015-05-28 LAB — BETA 2 MICROGLOBULIN, SERUM: Beta-2 Microglobulin: 1.87 mg/L (ref <=2.51)

## 2015-05-28 MED ORDER — DEXAMETHASONE 4 MG PO TABS: 4.0000 mg | ORAL_TABLET | Freq: Every day | ORAL | Status: DC

## 2015-05-28 MED ORDER — LIDOCAINE-PRILOCAINE 2.5-2.5 % EX CREA: 1.0000 "application " | TOPICAL_CREAM | CUTANEOUS | Status: DC | PRN

## 2015-05-28 MED ORDER — ERGOCALCIFEROL 1.25 MG (50000 UT) PO CAPS: 50000.0000 [IU] | ORAL_CAPSULE | ORAL | Status: DC

## 2015-05-28 NOTE — Telephone Encounter (Signed)
Pt will come today at 12:30

## 2015-05-28 NOTE — Assessment & Plan Note (Signed)
She has a meal pathologic fracture which may explain her worsening pain. I will consult radiation oncologist and interventional radiologist as above. She will continue pain management as prescribed. Recommend she also try Flexeril to take care of muscle spasm. She can also use topical lidocaine cream at the affected site. In the meantime, I will also give her high-dose vitamin D replacement therapy and start her on daily dexamethasone

## 2015-05-28 NOTE — Assessment & Plan Note (Signed)
This is likely due to recent treatment. I will hold treatment and proceed with other plans as outlined above

## 2015-05-28 NOTE — Assessment & Plan Note (Signed)
Repeat serum protein electrophoresis and M spike were not elevated but she have elevated free light chain ratio. It is possible that some multiple myeloma is not under good control with Revlimid alone. I think it is prudent for [place] to take care of her back first before we start switching her back on other more aggressive therapy. I have consult the radiation oncologist as soon as possible this evening to get palliative radiation to the new T1 compression fracture. I will also consult interventional radiology for possible kyphoplasty. In the meantime, I will put her on 4 mg of dexamethasone daily and reassess in 1 week.

## 2015-05-28 NOTE — Assessment & Plan Note (Signed)
She has severe vitamin D deficiency despite on high-dose vitamin D replacement therapy. I will start her on prescription strength vitamin D to take on a weekly basis.

## 2015-05-28 NOTE — Telephone Encounter (Signed)
-----   Message from Heath Lark, MD sent at 05/28/2015  8:25 AM EDT ----- Regarding: test results I have results of MRI Does she want to come in today instead of waiting till tomorrow? 1230 pm appt check in 1215?

## 2015-05-28 NOTE — Progress Notes (Signed)
[place] OFFICE PROGRESS NOTE  Patient Care Team: Marin Olp, MD as PCP - General (Family Medicine) Jeanann Lewandowsky, MD as Consulting Physician (Medical Oncology) Marin Olp, MD as Consulting Physician (Family Medicine) Heath Lark, MD as Consulting Physician (Hematology and Oncology)  SUMMARY OF ONCOLOGIC HISTORY: Oncology History   Multiple myeloma, kappa light chain disease, Durie-Salmon stage III     Multiple myeloma   07/16/2006 Bone Marrow Biopsy BM biopsy is non-diagnostic   09/21/2006 Procedure L5 vertebral biopsy 5% plasma cell   10/25/2006 Bone Marrow Biopsy BM biopsy is hypercellular with 5% plasma cell   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   01/03/2007 - 01/10/2007 Radiation Therapy Approximate date only, received RT for plasmacytoma followed by surgery   09/14/2007 Initial Diagnosis MULTIPLE  MYELOMA   10/05/2007 Bone Marrow Biopsy Bm biopsy was negative   06/18/2008 Bone Marrow Biopsy BM biopsy was negative   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 [facility]   04/13/2011 Relapse/Recurrence Disease relapse   04/14/2011 Bone Marrow Biopsy Bm biopsy showed 2 % plasma cell   04/27/2011 Relapse/Recurrence Disease relapse, treated with Velcade/Cytoxan/Dex   09/07/2011 - 07/28/2013 Chemotherapy She has been receiving Velcade   12/27/2011 Bone Marrow Transplant 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   08/02/2014 - 03/06/2015  Chemotherapy She enrolled in clinical trial using combination therapy with Revlimid, dexamethasone and Elotuzumab   03/07/2015 -  Chemotherapy She is on maintenance Revlimid only without dexamethasone    INTERVAL HISTORY: Please see below for problem oriented charting. She is seen urgently today because of worsening back pain. She denies any focal neurological deficit. The pain is centered in the upper back region radiating to the right upper back area, not under good control with current prescription pain medicine. She denies recent infection.  REVIEW OF SYSTEMS:   Constitutional: Denies fevers, chills or abnormal weight loss Eyes: Denies blurriness of vision Ears, nose, mouth, throat, and face: Denies mucositis or sore throat Respiratory: Denies cough, dyspnea or wheezes Cardiovascular: Denies palpitation, chest discomfort or lower extremity swelling Gastrointestinal:  Denies nausea, heartburn or change in bowel habits Skin: Denies abnormal skin rashes Lymphatics: Denies new lymphadenopathy or easy bruising Neurological:Denies numbness, tingling or new weaknesses Behavioral/Psych: Mood is stable, no new changes  All other systems were reviewed with the patient and are negative.  I have reviewed the past medical history, past surgical history, social history and family history with the patient and they are unchanged from previous note.  ALLERGIES:  is allergic to codeine; ibuprofen; and albuterol.  MEDICATIONS:  Current Outpatient Prescriptions  Medication Sig Dispense Refill  . Alum & Mag Hydroxide-Simeth (MAGIC MOUTHWASH) SOLN Take 10 mLs by mouth 4 (four) times daily as needed for mouth pain. Swish and spit  Or  Swish and swallow. 300 mL 2  . aspirin 81 MG tablet Take 81 mg by mouth daily.    . Cholecalciferol (VITAMIN D3) 5000 UNITS TABS Take 5,000 mg by mouth every morning. 30 tablet 0  .  cyclobenzaprine (FLEXERIL) 10 MG tablet Take 1 tablet (10 mg total) by mouth 3 (three)  times daily as needed for muscle spasms. 60 tablet 0  . dexamethasone (DECADRON) 4 MG tablet Take 1 tablet (4 mg total) by mouth daily. 30 tablet 1  . ipratropium (ATROVENT HFA) 17 MCG/ACT inhaler Inhale 2 puffs into the lungs every 4 (four) hours as needed for wheezing. 1 Inhaler 12  . lenalidomide (REVLIMID) 25 MG capsule TAKE ONE CAPSULE BY MOUTH EVERY DAY FOR 21 DAYS THEN 7 DAYS OFF. 21 capsule 0  . levothyroxine (SYNTHROID) 50 MCG tablet Take 1 tablet (50 mcg total) by mouth daily before breakfast. 30 tablet 3  . lidocaine (LIDODERM) 5 % Place 1 patch onto the skin daily as needed (pain). Remove & Discard patch within 12 hours or as directed by MD    . lidocaine-prilocaine (EMLA) cream Apply 1 application topically as needed. 30 g 3  . losartan (COZAAR) 50 MG tablet TAKE 1/2 TO 1 TABLET DAILY 30 tablet 4  . magnesium hydroxide (MILK OF MAGNESIA) 400 MG/5ML suspension Take 30 mLs by mouth daily as needed for mild constipation (constipation).     . morphine (MS CONTIN) 60 MG 12 hr tablet Take 60 mg by mouth every 12 (twelve) hours.    Marland Kitchen morphine (MSIR) 30 MG tablet Take 30 mg by mouth every 4 (four) hours as needed for severe pain (pain).     . prochlorperazine (COMPAZINE) 10 MG tablet Take 1 tablet (10 mg total) by mouth every 6 (six) hours as needed for nausea. 60 tablet 3  . ergocalciferol (VITAMIN D2) 50000 UNITS capsule Take 1 capsule (50,000 Units total) by mouth once a week. 12 capsule 3  . [DISCONTINUED] amLODipine (NORVASC) 5 MG tablet Take 1 tablet (5 mg total) by mouth daily. 90 tablet 3  . [DISCONTINUED] gabapentin (NEURONTIN) 600 MG tablet Take 600 mg by mouth 3 (three) times daily.       No current facility-administered medications for this visit.   Facility-Administered Medications Ordered in Other Visits  Medication Dose Route Frequency Provider Last Rate Last Dose  . sodium chloride 0.9 % injection 10 mL  10 mL Intravenous PRN Heath Lark, MD   10 mL at 02/26/14 1425  . sodium  chloride 0.9 % injection 10 mL  10 mL Intravenous PRN Chauncey Cruel, MD   10 mL at 04/05/14 1333    PHYSICAL EXAMINATION: ECOG PERFORMANCE STATUS: 2 - Symptomatic, <50% confined to bed  Filed Vitals:   05/28/15 1228  BP: 119/88  Pulse: 82  Temp: 98.3 F (36.8 C)  Resp: 18   Filed Weights   05/28/15 1228  Weight: 128 lb 4.8 oz (58.196 kg)    GENERAL:alert, no distress and comfortable SKIN: skin color, texture, turgor are normal, no rashes or significant lesions EYES: normal, Conjunctiva are pink and non-injected, sclera clear OROPHARYNX:no exudate, no erythema and lips, buccal mucosa, and tongue normal  NECK: supple, thyroid normal size, non-tender, without nodularity LYMPH:  no palpable lymphadenopathy in the cervical, axillary or inguinal LUNGS: clear to auscultation and percussion with normal breathing effort HEART: regular rate & rhythm and no murmurs and no lower extremity edema ABDOMEN:abdomen soft, non-tender and normal bowel sounds Musculoskeletal:no cyanosis of digits and no clubbing  NEURO: alert & oriented x 3 with fluent speech, no focal motor/sensory deficits. She is in severe distress from pain  LABORATORY DATA:  I have reviewed the data as listed    Component Value Date/Time  NA 140 04/17/2015 1122   NA 140 12/28/2014 1933   K 3.5 04/17/2015 1122   K 3.8 12/28/2014 1933   CL 109 12/28/2014 1933   CL 107 03/10/2013 1014   CO2 22 04/17/2015 1122   CO2 24 12/28/2014 1933   GLUCOSE 103 04/17/2015 1122   GLUCOSE 144* 12/28/2014 1933   GLUCOSE 111* 03/10/2013 1014   GLUCOSE 98 08/31/2006 1024   BUN 10.3 04/17/2015 1122   BUN 14 12/28/2014 1933   CREATININE 0.8 04/17/2015 1122   CREATININE 0.67 12/28/2014 1933   CALCIUM 8.8 04/17/2015 1122   CALCIUM 8.8 12/28/2014 1933   PROT 6.3* 04/17/2015 1122   PROT 6.6 09/11/2014 0336   ALBUMIN 3.4* 04/17/2015 1122   ALBUMIN 3.4* 09/11/2014 0336   AST 14 04/17/2015 1122   AST 18 09/11/2014 0336   ALT 10  04/17/2015 1122   ALT 18 09/11/2014 0336   ALKPHOS 86 04/17/2015 1122   ALKPHOS 113 09/11/2014 0336   BILITOT 0.54 04/17/2015 1122   BILITOT 0.6 09/11/2014 0336   GFRNONAA >90 12/28/2014 1933   GFRAA >90 12/28/2014 1933    No results found for: SPEP, UPEP  Lab Results  Component Value Date   WBC 3.5* 05/24/2015   NEUTROABS 1.5 05/24/2015   HGB 13.5 05/24/2015   HCT 39.6 05/24/2015   MCV 90.8 05/24/2015   PLT 143* 05/24/2015      Chemistry      Component Value Date/Time   NA 140 04/17/2015 1122   NA 140 12/28/2014 1933   K 3.5 04/17/2015 1122   K 3.8 12/28/2014 1933   CL 109 12/28/2014 1933   CL 107 03/10/2013 1014   CO2 22 04/17/2015 1122   CO2 24 12/28/2014 1933   BUN 10.3 04/17/2015 1122   BUN 14 12/28/2014 1933   CREATININE 0.8 04/17/2015 1122   CREATININE 0.67 12/28/2014 1933      Component Value Date/Time   CALCIUM 8.8 04/17/2015 1122   CALCIUM 8.8 12/28/2014 1933   ALKPHOS 86 04/17/2015 1122   ALKPHOS 113 09/11/2014 0336   AST 14 04/17/2015 1122   AST 18 09/11/2014 0336   ALT 10 04/17/2015 1122   ALT 18 09/11/2014 0336   BILITOT 0.54 04/17/2015 1122   BILITOT 0.6 09/11/2014 0336       RADIOGRAPHIC STUDIES: I reviewed the imaging study with the patient and family I have personally reviewed the radiological images as listed and agreed with the findings in the report. Mr Total Spine Mets Screening  05/27/2015   CLINICAL DATA:  Multiple myeloma.  Severe low back pain.  EXAM: MRI TOTAL SPINE WITHOUT AND WITH CONTRAST  TECHNIQUE: Multisequence MR imaging of the spine from the cervical spine to the sacrum was performed prior to and following IV contrast administration for evaluation of spinal metastatic disease.  CONTRAST:  3mL MULTIHANCE GADOBENATE DIMEGLUMINE 529 MG/ML IV SOLN  COMPARISON:  MR lumbar 07/04/2011.  FINDINGS: Cervical Findings:  No evidence of active marrow space disease in the cervical spine. Old appearing inferior endplate fracture at C4. No  evidence of compressive canal stenosis.  Thoracic Findings:  Pathologic compression fracture at T1 with complete marrow replacement at that level. Loss of height centrally of 60%. Posterior bowing of the posterior superior margin of the vertebral body. No compression of the cord. Elsewhere in the thoracic spine, there is no confluent disease. There are punctate foci of T2 signal in the vertebral marrow of that are indeterminate for benign versus malignant cellularity. There  are old partial compression fractures at T5 and T7. No canal compromise below T1. The cord appears normal.  Lumbar Findings:  Marrow space tumor is present at L1 occupying about 1/3 of the vertebral body marrow space. There is mild posterior bowing of the posterior inferior margin of the vertebral body but no evidence of compressive narrowing or extraosseous tumor. Inferiorly at L3, there is an inferior endplate fracture with some associated abnormal marrow signal. This probably represents a recent Schmorl's node or pathologic fracture in the location of some marrow space tumor. No extraosseous tumor or neural compression. Old augmented fracture is present at L5 without evidence of active cellularity at that level. Mild edema at the inferior endplate of uncertain significance. Old augmentation in the sacral region on the right without evidence of ongoing hyper cellularity.  IMPRESSION: Cervical region:  Negative for acute disease.  Thoracic region: Pathologic compression fracture at T1 with loss of height centrally of 60% and posterior bowing of the posterior margin of the vertebral body. Complete replacement of the marrow at this level. No neural compression at this time. No extraosseous tumor. Small punctate foci of T2 signal in the marrow space throughout the remainder of the throughout the spine that are indeterminate for benign versus is malignant cellularity.  Lumbar region: Tumor within the L1 vertebral body with slight bowing of the  posterior inferior margin of the vertebral body but no neural compression. Inferior endplate fracture on the right at L3 in a region of abnormal marrow signal. This could represent a endplate Schmorl's node or pathologic fracture in a region of tumor. No extraosseous tumor or neural compression. Previously augmented L5 and sacrum without evidence of definite active disease at that level. Mild edema signal at the inferior endplate of L5 of uncertain significance.   Electronically Signed   By: Nelson Chimes M.D.   On: 05/27/2015 21:39     ASSESSMENT & PLAN:  Multiple myeloma Repeat serum protein electrophoresis and M spike were not elevated but she have elevated free light chain ratio. It is possible that some multiple myeloma is not under good control with Revlimid alone. I think it is prudent for [place] to take care of her back first before we start switching her back on other more aggressive therapy. I have consult the radiation oncologist as soon as possible this evening to get palliative radiation to the new T1 compression fracture. I will also consult interventional radiology for possible kyphoplasty. In the meantime, I will put her on 4 mg of dexamethasone daily and reassess in 1 week.  Vitamin D deficiency She has severe vitamin D deficiency despite on high-dose vitamin D replacement therapy. I will start her on prescription strength vitamin D to take on a weekly basis.  Pathologic fracture She has a meal pathologic fracture which may explain her worsening pain. I will consult radiation oncologist and interventional radiologist as above. She will continue pain management as prescribed. Recommend she also try Flexeril to take care of muscle spasm. She can also use topical lidocaine cream at the affected site. In the meantime, I will also give her high-dose vitamin D replacement therapy and start her on daily dexamethasone  Leukopenia due to antineoplastic chemotherapy This is likely due to  recent treatment. I will hold treatment and proceed with other plans as outlined above     Orders Placed This Encounter  Procedures  . IR KYPHO VERTEBRAL THORACIC AUGMENTATION    Standing Status: Future     Number of Occurrences:  Standing Expiration Date: 07/28/2016    Order Specific Question:  Reason for Exam (SYMPTOM  OR DIAGNOSIS REQUIRED)    Answer:  for myeloma thoracic fracture    Order Specific Question:  Preferred Imaging Location?    Answer:  [facility]   All questions were answered. The patient knows to call the clinic with any problems, questions or concerns. No barriers to learning was detected. I spent 40 minutes counseling the patient face to face. The total time spent in the appointment was 55 minutes and more than 50% was on counseling and review of test results     [facility], Carolynn Tuley, MD 05/28/2015 5:03 PM

## 2015-05-28 NOTE — Telephone Encounter (Signed)
Gave adn printed appt sched adn avs to pt

## 2015-05-29 ENCOUNTER — Ambulatory Visit: Payer: Medicare Other

## 2015-05-29 ENCOUNTER — Ambulatory Visit: Payer: Self-pay | Admitting: Hematology and Oncology

## 2015-05-29 ENCOUNTER — Ambulatory Visit
Admission: RE | Admit: 2015-05-29 | Discharge: 2015-05-29 | Disposition: A | Discharge status: Home / Self Care | Payer: Medicare Other | Source: Ambulatory Visit | Attending: Radiation Oncology | Admitting: Radiation Oncology

## 2015-05-29 ENCOUNTER — Telehealth: Payer: Self-pay | Admitting: *Deleted

## 2015-05-29 ENCOUNTER — Other Ambulatory Visit: Payer: Self-pay

## 2015-05-29 ENCOUNTER — Encounter: Payer: Self-pay | Admitting: Radiation Oncology

## 2015-05-29 DIAGNOSIS — Z515 Encounter for palliative care: Secondary | ICD-10-CM | POA: Insufficient documentation

## 2015-05-29 DIAGNOSIS — Z510 Encounter for antineoplastic radiation therapy: Secondary | ICD-10-CM | POA: Insufficient documentation

## 2015-05-29 DIAGNOSIS — C9000 Multiple myeloma not having achieved remission: Secondary | ICD-10-CM | POA: Insufficient documentation

## 2015-05-29 DIAGNOSIS — Z7982 Long term (current) use of aspirin: Secondary | ICD-10-CM | POA: Insufficient documentation

## 2015-05-29 NOTE — Telephone Encounter (Signed)
"  Someone called about at appointment today at 11:00.  I don't know where this appointment is and it's too late to get there."   Called [name].  Appointment for today has been moved to 05-31-2015 beginning at 10:00 am with RT.  Reviewed appointments.  Verbalized understanding.

## 2015-05-29 NOTE — Progress Notes (Signed)
Histology and Location of Primary Cancer: Multiple myeloma kappa light chain disease,Dura-Salmon stage III  Sites of Visceral and Bony Metastatic Disease: T1.  Location(s) of Symptomatic Metastases: T1    Past/Anticipated chemotherapy by medical oncology, if any: Dr. Alvy Bimler 05/28/15 referral for palliative radiation T1 compression fracture , referral for Kyphoplasty  After radiation is complete, Chemotherapy 03/07/15- on maintenance Revlimid only without Dexamethasone Was on clinical trial 08/02/2014 -03/06/2015 using combination therapy with Revlimid,dexamethasone and Elotuzumab  Pain on a scale of 0-10 is: Upper back pain 2/10 now, just took MSIR this am,    If Spine Met(s), symptoms, if any, include:  Bowel/Bladder retention or incontinence no bladder problems, bowels  Move once week, takes MOM prn qod   Numbness or weakness in extremities weakness in hands   Current Decadron regimen, if applicable: $RemoveBefore'4mg'[id number]$  daily  Started 05/28/15 reassess 1 week   Ambulatory status? Walker? Wheelchair?: cane  SAFETY ISSUES:YES , uses cane  Prior radiation? Yes, XRT for rib pain 10/09/2013-10/20/2013= T10/11 , 7th right rib        XRT  completed 01/2007 L2-S1 by Dr. Beola Cord  Pacemaker/ICD? NO  Possible current pregnancy? NO  Is the patient on methotrexate? 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

## 2015-05-31 ENCOUNTER — Ambulatory Visit
Admission: RE | Admit: 2015-05-31 | Discharge: 2015-05-31 | Disposition: A | Discharge status: Home / Self Care | Payer: Medicare Other | Source: Ambulatory Visit | Attending: Radiation Oncology | Admitting: Radiation Oncology

## 2015-05-31 ENCOUNTER — Encounter: Payer: Self-pay | Admitting: Radiation Oncology

## 2015-05-31 VITALS — BP 131/73 | HR 86 | Temp 98.3°F | Resp 22 | Ht 63.0 in | Wt 130.1 lb

## 2015-05-31 DIAGNOSIS — Z510 Encounter for antineoplastic radiation therapy: Secondary | ICD-10-CM | POA: Diagnosis not present

## 2015-05-31 DIAGNOSIS — C9000 Multiple myeloma not having achieved remission: Secondary | ICD-10-CM

## 2015-05-31 DIAGNOSIS — Z515 Encounter for palliative care: Secondary | ICD-10-CM | POA: Diagnosis not present

## 2015-05-31 DIAGNOSIS — C7951 Secondary malignant neoplasm of bone: Secondary | ICD-10-CM | POA: Diagnosis not present

## 2015-05-31 DIAGNOSIS — C801 Malignant (primary) neoplasm, unspecified: Secondary | ICD-10-CM | POA: Diagnosis not present

## 2015-05-31 DIAGNOSIS — C7952 Secondary malignant neoplasm of bone marrow: Secondary | ICD-10-CM

## 2015-05-31 DIAGNOSIS — Z7982 Long term (current) use of aspirin: Secondary | ICD-10-CM | POA: Diagnosis not present

## 2015-05-31 NOTE — Progress Notes (Signed)
Radiation Oncology         [phone] ________________________________  Name: Kendra Johns MRN: [id number]  Date: 05/31/2015  DOB: [birth year]  Re-consulation Visit Note  CC: [name], MD  Heath Lark, MD  Diagnosis: Multiple Myeloma  Interval Since Last Radiation:  1 year and 8 months (10/09/2013 through 10/20/2013) Site/dose: 1.  T10/11 225 gray in 10 fractions at 2.5 gray per fraction. This was treated using AP and PA fields. 2.  Right 7th rib and nearby thoracic spine to 25 gray in 10 fractions at 2.5 gray per fraction. This was treated using a three-field technique.   Narrative:  The patient returns today for routine follow-up. Pt reports having a lot of pain in her right flank. Reports the pain started 2 weeks ago in the lower back and has moved up to the right side of her body. Denies neck pain. States that the lower back pain has subsided. The patient had an mri scan recently that demonstrated diffuse involvement of the T1 vertebral body.  ALLERGIES:  is allergic to codeine; ibuprofen; and albuterol.  Meds: Current Outpatient Prescriptions  Medication Sig Dispense Refill  . aspirin 81 MG tablet Take 81 mg by mouth daily.    . cyclobenzaprine (FLEXERIL) 10 MG tablet Take 1 tablet (10 mg total) by mouth 3 (three) times daily as needed for muscle spasms. 60 tablet 0  . dexamethasone (DECADRON) 4 MG tablet Take 1 tablet (4 mg total) by mouth daily. 30 tablet 1  . ergocalciferol (VITAMIN D2) 50000 UNITS capsule Take 1 capsule (50,000 Units total) by mouth once a week. 12 capsule 3  . ipratropium (ATROVENT HFA) 17 MCG/ACT inhaler Inhale 2 puffs into the lungs every 4 (four) hours as needed for wheezing. 1 Inhaler 12  . lenalidomide (REVLIMID) 25 MG capsule TAKE ONE CAPSULE BY MOUTH EVERY DAY FOR 21 DAYS THEN 7 DAYS OFF. 21 capsule 0  . levothyroxine (SYNTHROID) 50 MCG tablet Take 1 tablet (50 mcg total) by mouth daily before breakfast. 30 tablet 3  . lidocaine-prilocaine  (EMLA) cream Apply 1 application topically as needed. 30 g 3  . losartan (COZAAR) 50 MG tablet TAKE 1/2 TO 1 TABLET DAILY 30 tablet 4  . magnesium hydroxide (MILK OF MAGNESIA) 400 MG/5ML suspension Take 30 mLs by mouth daily as needed for mild constipation (constipation).     . morphine (MS CONTIN) 60 MG 12 hr tablet Take 60 mg by mouth every 8 (eight) hours.     Marland Kitchen morphine (MSIR) 30 MG tablet Take 30 mg by mouth every 4 (four) hours as needed for severe pain (pain).     . Alum & Mag Hydroxide-Simeth (MAGIC MOUTHWASH) SOLN Take 10 mLs by mouth 4 (four) times daily as needed for mouth pain. Swish and spit  Or  Swish and swallow. (Patient not taking: Reported on 05/31/2015) 300 mL 2  . Cholecalciferol (VITAMIN D3) 5000 UNITS TABS Take 5,000 mg by mouth every morning. (Patient not taking: Reported on 05/31/2015) 30 tablet 0  . lidocaine (LIDODERM) 5 % Place 1 patch onto the skin daily as needed (pain). Remove & Discard patch within 12 hours or as directed by MD    . prochlorperazine (COMPAZINE) 10 MG tablet Take 1 tablet (10 mg total) by mouth every 6 (six) hours as needed for nausea. 60 tablet 3  . [DISCONTINUED] amLODipine (NORVASC) 5 MG tablet Take 1 tablet (5 mg total) by mouth daily. 90 tablet 3  . [DISCONTINUED] gabapentin (NEURONTIN) 600 MG  tablet Take 600 mg by mouth 3 (three) times daily.       No current facility-administered medications for this encounter.   Facility-Administered Medications Ordered in Other Encounters  Medication Dose Route Frequency Provider Last Rate Last Dose  . sodium chloride 0.9 % injection 10 mL  10 mL Intravenous PRN Heath Lark, MD   10 mL at 02/26/14 1425  . sodium chloride 0.9 % injection 10 mL  10 mL Intravenous PRN Chauncey Cruel, MD   10 mL at 04/05/14 1333    Physical Findings: The patient is in no acute distress. Patient is alert and oriented.  height is $RemoveB'5\' 3"'hNTKvMhe$  (1.6 m) and weight is 130 lb 1.6 oz (59.013 kg). Her oral temperature is 98.3 F (36.8 C).  Her blood pressure is 131/73 and her pulse is 86. Her respiration is 22 and oxygen saturation is 99%.  Alert and oriented times three.  Lab Findings: Lab Results  Component Value Date   WBC 3.5* 05/24/2015   HGB 13.5 05/24/2015   HCT 39.6 05/24/2015   MCV 90.8 05/24/2015   PLT 143* 05/24/2015     Radiographic Findings: Mr Total Spine Mets Screening  05/27/2015   CLINICAL DATA:  Multiple myeloma.  Severe low back pain.  EXAM: MRI TOTAL SPINE WITHOUT AND WITH CONTRAST  TECHNIQUE: Multisequence MR imaging of the spine from the cervical spine to the sacrum was performed prior to and following IV contrast administration for evaluation of spinal metastatic disease.  CONTRAST:  73mL MULTIHANCE GADOBENATE DIMEGLUMINE 529 MG/ML IV SOLN  COMPARISON:  MR lumbar 07/04/2011.  FINDINGS: Cervical Findings:  No evidence of active marrow space disease in the cervical spine. Old appearing inferior endplate fracture at C4. No evidence of compressive canal stenosis.  Thoracic Findings:  Pathologic compression fracture at T1 with complete marrow replacement at that level. Loss of height centrally of 60%. Posterior bowing of the posterior superior margin of the vertebral body. No compression of the cord. Elsewhere in the thoracic spine, there is no confluent disease. There are punctate foci of T2 signal in the vertebral marrow of that are indeterminate for benign versus malignant cellularity. There are old partial compression fractures at T5 and T7. No canal compromise below T1. The cord appears normal.  Lumbar Findings:  Marrow space tumor is present at L1 occupying about 1/3 of the vertebral body marrow space. There is mild posterior bowing of the posterior inferior margin of the vertebral body but no evidence of compressive narrowing or extraosseous tumor. Inferiorly at L3, there is an inferior endplate fracture with some associated abnormal marrow signal. This probably represents a recent Schmorl's node or pathologic  fracture in the location of some marrow space tumor. No extraosseous tumor or neural compression. Old augmented fracture is present at L5 without evidence of active cellularity at that level. Mild edema at the inferior endplate of uncertain significance. Old augmentation in the sacral region on the right without evidence of ongoing hyper cellularity.  IMPRESSION: Cervical region:  Negative for acute disease.  Thoracic region: Pathologic compression fracture at T1 with loss of height centrally of 60% and posterior bowing of the posterior margin of the vertebral body. Complete replacement of the marrow at this level. No neural compression at this time. No extraosseous tumor. Small punctate foci of T2 signal in the marrow space throughout the remainder of the throughout the spine that are indeterminate for benign versus is malignant cellularity.  Lumbar region: Tumor within the L1 vertebral body with slight  bowing of the posterior inferior margin of the vertebral body but no neural compression. Inferior endplate fracture on the right at L3 in a region of abnormal marrow signal. This could represent a endplate Schmorl's node or pathologic fracture in a region of tumor. No extraosseous tumor or neural compression. Previously augmented L5 and sacrum without evidence of definite active disease at that level. Mild edema signal at the inferior endplate of L5 of uncertain significance.   Electronically Signed   By: Nelson Chimes M.D.   On: 05/27/2015 21:39    Impression: Multiple myeloma with T1 compression fracture. The pt is a good candidate for palliative radiation to this area.  Plan: I will perform palliative radiation to the T1 compression fracture. I explained the process of CT sim and the placement of tattoos. The pt signed a consent form and this was placed in her chart. The pt will have CT sim later today.  This document serves as a record of services personally performed by Kyung Rudd, MD. It was created on  his behalf by Darcus Austin, a trained medical scribe. The creation of this record is based on the scribe's personal observations and the provider's statements to them. This document has been checked and approved by the attending provider.     ------------------------------------------------  Jodelle Gross, MD, PhD

## 2015-06-03 ENCOUNTER — Ambulatory Visit (HOSPITAL_COMMUNITY): Payer: No Typology Code available for payment source

## 2015-06-03 ENCOUNTER — Telehealth: Payer: Self-pay | Admitting: *Deleted

## 2015-06-03 DIAGNOSIS — M545 Low back pain: Secondary | ICD-10-CM | POA: Diagnosis not present

## 2015-06-03 DIAGNOSIS — Z79891 Long term (current) use of opiate analgesic: Secondary | ICD-10-CM | POA: Diagnosis not present

## 2015-06-03 DIAGNOSIS — G894 Chronic pain syndrome: Secondary | ICD-10-CM | POA: Diagnosis not present

## 2015-06-03 DIAGNOSIS — K5900 Constipation, unspecified: Secondary | ICD-10-CM | POA: Diagnosis not present

## 2015-06-03 NOTE — Telephone Encounter (Signed)
ON 06/04/15 PT. TO SEE [place] AT 12:30PM. ON 06/05/15 PT. TO SEE DR.MOODY AT 3:15PM. NOTIFIED PT. SHE WILL PICK UP A NEW SCHEDULE TOMORROW.

## 2015-06-04 ENCOUNTER — Other Ambulatory Visit: Payer: Self-pay | Admitting: *Deleted

## 2015-06-04 ENCOUNTER — Telehealth: Payer: Self-pay | Admitting: Hematology and Oncology

## 2015-06-04 ENCOUNTER — Ambulatory Visit (HOSPITAL_BASED_OUTPATIENT_CLINIC_OR_DEPARTMENT_OTHER): Payer: Medicare Other | Admitting: Hematology and Oncology

## 2015-06-04 VITALS — BP 172/99 | HR 76 | Temp 98.2°F | Resp 18 | Ht 63.0 in | Wt 135.1 lb

## 2015-06-04 DIAGNOSIS — M8440XS Pathological fracture, unspecified site, sequela: Secondary | ICD-10-CM

## 2015-06-04 DIAGNOSIS — C9000 Multiple myeloma not having achieved remission: Secondary | ICD-10-CM

## 2015-06-04 DIAGNOSIS — Z7982 Long term (current) use of aspirin: Secondary | ICD-10-CM | POA: Diagnosis not present

## 2015-06-04 DIAGNOSIS — M545 Low back pain: Secondary | ICD-10-CM | POA: Diagnosis not present

## 2015-06-04 DIAGNOSIS — Z515 Encounter for palliative care: Secondary | ICD-10-CM | POA: Diagnosis not present

## 2015-06-04 DIAGNOSIS — C801 Malignant (primary) neoplasm, unspecified: Secondary | ICD-10-CM | POA: Diagnosis not present

## 2015-06-04 DIAGNOSIS — Z510 Encounter for antineoplastic radiation therapy: Secondary | ICD-10-CM | POA: Diagnosis not present

## 2015-06-04 DIAGNOSIS — C7951 Secondary malignant neoplasm of bone: Secondary | ICD-10-CM | POA: Diagnosis not present

## 2015-06-04 MED ORDER — LENALIDOMIDE 25 MG PO CAPS: ORAL_CAPSULE | ORAL | Status: DC

## 2015-06-04 NOTE — Progress Notes (Signed)
[facility] OFFICE PROGRESS NOTE  Patient Care Team: Marin Olp, MD as PCP - General (Family Medicine) Jeanann Lewandowsky, MD as Consulting Physician (Medical Oncology) Marin Olp, MD as Consulting Physician (Family Medicine) Heath Lark, MD as Consulting Physician (Hematology and Oncology)  SUMMARY OF ONCOLOGIC HISTORY: Oncology History   Multiple myeloma, kappa light chain disease, Durie-Salmon stage III     Multiple myeloma   07/16/2006 Bone Marrow Biopsy BM biopsy is non-diagnostic   09/21/2006 Procedure L5 vertebral biopsy 5% plasma cell   10/25/2006 Bone Marrow Biopsy BM biopsy is hypercellular with 5% plasma cell   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   01/03/2007 - 01/10/2007 Radiation Therapy Approximate date only, received RT for plasmacytoma followed by surgery   09/14/2007 Initial Diagnosis MULTIPLE  MYELOMA   10/05/2007 Bone Marrow Biopsy Bm biopsy was negative   06/18/2008 Bone Marrow Biopsy BM biopsy was negative   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 [facility]   04/13/2011 Relapse/Recurrence Disease relapse   04/14/2011 Bone Marrow Biopsy Bm biopsy showed 2 % plasma cell   04/27/2011 Relapse/Recurrence Disease relapse, treated with Velcade/Cytoxan/Dex   09/07/2011 - 07/28/2013 Chemotherapy She has been receiving Velcade   12/27/2011 Bone Marrow Transplant [facility]nd [facility] at [facility]   07/28/2013 Relapse/Recurrence Chemo is stopped due to progression of disease   08/29/2013 Imaging PEt/CT showed recurrence of disease with new lesion on her rib with compression fracture   09/13/2013 Bone Marrow Biopsy BM biopsy is hypercellular with 6% plasma cell   10/10/2013 - 10/20/2013 Radiation Therapy Started on palliative XRT for rib pain   11/27/2013 - 04/06/2014 Chemotherapy The patient starts chemotherapy with Carfilzomib   07/19/2014 Imaging Repeat bloodwork and PET/CT scan shows significant disease progression.   08/02/2014 - 03/06/2015  Chemotherapy She enrolled in clinical trial using combination therapy with Revlimid, dexamethasone and Elotuzumab   03/07/2015 - 06/04/2015 Chemotherapy She is on maintenance Revlimid only without dexamethasone   05/27/2015 Imaging  MRI spine showed new compression fracture.   06/04/2015 -  Radiation Therapy  she begin palliative radiation therapy.    INTERVAL HISTORY: Please see below for problem oriented charting. She is seen for further follow-up. Her back pain is rated at 7 out of 10 pain. Her pain has improved dramatically since last week. Palliative radiation treatment is started. She brought a cane and has improved her gait. She has no new complaints  REVIEW OF SYSTEMS:   Constitutional: Denies fevers, chills or abnormal weight loss Eyes: Denies blurriness of vision Ears, nose, mouth, throat, and face: Denies mucositis or sore throat Respiratory: Denies cough, dyspnea or wheezes Cardiovascular: Denies palpitation, chest discomfort or lower extremity swelling Gastrointestinal:  Denies nausea, heartburn or change in bowel habits Skin: Denies abnormal skin rashes Lymphatics: Denies new lymphadenopathy or easy bruising Neurological:Denies numbness, tingling or new weaknesses Behavioral/Psych: Mood is stable, no new changes  All other systems were reviewed with the patient and are negative.  I have reviewed the past medical history, past surgical history, social history and family history with the patient and they are unchanged from previous note.  ALLERGIES:  is allergic to codeine; ibuprofen; and albuterol.  MEDICATIONS:  Current Outpatient Prescriptions  Medication Sig Dispense Refill  . Alum & Mag Hydroxide-Simeth (MAGIC MOUTHWASH) SOLN Take 10 mLs by mouth 4 (four) times daily as needed for mouth pain. Swish and spit  Or  Swish and swallow. 300 mL 2  . aspirin 81 MG tablet Take 81 mg by mouth  daily.    . Cholecalciferol (VITAMIN D3) 5000 UNITS TABS Take 5,000 mg by mouth every  morning. 30 tablet 0  . cyclobenzaprine (FLEXERIL) 10 MG tablet Take 1 tablet (10 mg total) by mouth 3 (three) times daily as needed for muscle spasms. 60 tablet 0  . dexamethasone (DECADRON) 4 MG tablet Take 1 tablet (4 mg total) by mouth daily. 30 tablet 1  . ergocalciferol (VITAMIN D2) 50000 UNITS capsule Take 1 capsule (50,000 Units total) by mouth once a week. 12 capsule 3  . ipratropium (ATROVENT HFA) 17 MCG/ACT inhaler Inhale 2 puffs into the lungs every 4 (four) hours as needed for wheezing. 1 Inhaler 12  . lenalidomide (REVLIMID) 25 MG capsule TAKE ONE CAPSULE BY MOUTH EVERY DAY FOR 21 DAYS THEN 7 DAYS OFF. 21 capsule 0  . levothyroxine (SYNTHROID) 50 MCG tablet Take 1 tablet (50 mcg total) by mouth daily before breakfast. 30 tablet 3  . lidocaine (LIDODERM) 5 % Place 1 patch onto the skin daily as needed (pain). Remove & Discard patch within 12 hours or as directed by MD    . lidocaine-prilocaine (EMLA) cream Apply 1 application topically as needed. 30 g 3  . losartan (COZAAR) 50 MG tablet TAKE 1/2 TO 1 TABLET DAILY 30 tablet 4  . magnesium hydroxide (MILK OF MAGNESIA) 400 MG/5ML suspension Take 30 mLs by mouth daily as needed for mild constipation (constipation).     . morphine (MS CONTIN) 60 MG 12 hr tablet Take 60 mg by mouth every 8 (eight) hours.     Marland Kitchen morphine (MSIR) 30 MG tablet Take 30 mg by mouth every 4 (four) hours as needed for severe pain (pain).     . prochlorperazine (COMPAZINE) 10 MG tablet Take 1 tablet (10 mg total) by mouth every 6 (six) hours as needed for nausea. 60 tablet 3  . [DISCONTINUED] amLODipine (NORVASC) 5 MG tablet Take 1 tablet (5 mg total) by mouth daily. 90 tablet 3  . [DISCONTINUED] gabapentin (NEURONTIN) 600 MG tablet Take 600 mg by mouth 3 (three) times daily.       No current facility-administered medications for this visit.   Facility-Administered Medications Ordered in Other Visits  Medication Dose Route Frequency Provider Last Rate Last Dose  .  sodium chloride 0.9 % injection 10 mL  10 mL Intravenous PRN Heath Lark, MD   10 mL at 02/26/14 1425  . sodium chloride 0.9 % injection 10 mL  10 mL Intravenous PRN Chauncey Cruel, MD   10 mL at 04/05/14 1333    PHYSICAL EXAMINATION: ECOG PERFORMANCE STATUS: 1 - Symptomatic but completely ambulatory  Filed Vitals:   06/04/15 1247  BP: 172/99  Pulse: 76  Temp: 98.2 F (36.8 C)  Resp: 18   Filed Weights   06/04/15 1247  Weight: 135 lb 1.6 oz (61.281 kg)    GENERAL:alert, no distress and comfortable SKIN: skin color, texture, turgor are normal, no rashes or significant lesions EYES: normal, Conjunctiva are pink and non-injected, sclera clear Musculoskeletal:no cyanosis of digits and no clubbing  NEURO: alert & oriented x 3 with fluent speech, no focal motor/sensory deficits  LABORATORY DATA:  I have reviewed the data as listed    Component Value Date/Time   NA 140 04/17/2015 1122   NA 140 12/28/2014 1933   K 3.5 04/17/2015 1122   K 3.8 12/28/2014 1933   CL 109 12/28/2014 1933   CL 107 03/10/2013 1014   CO2 22 04/17/2015 1122   CO2  24 12/28/2014 1933   GLUCOSE 103 04/17/2015 1122   GLUCOSE 144* 12/28/2014 1933   GLUCOSE 111* 03/10/2013 1014   GLUCOSE 98 08/31/2006 1024   BUN 10.3 04/17/2015 1122   BUN 14 12/28/2014 1933   CREATININE 0.8 04/17/2015 1122   CREATININE 0.67 12/28/2014 1933   CALCIUM 8.8 04/17/2015 1122   CALCIUM 8.8 12/28/2014 1933   PROT 6.3* 04/17/2015 1122   PROT 6.6 09/11/2014 0336   ALBUMIN 3.4* 04/17/2015 1122   ALBUMIN 3.4* 09/11/2014 0336   AST 14 04/17/2015 1122   AST 18 09/11/2014 0336   ALT 10 04/17/2015 1122   ALT 18 09/11/2014 0336   ALKPHOS 86 04/17/2015 1122   ALKPHOS 113 09/11/2014 0336   BILITOT 0.54 04/17/2015 1122   BILITOT 0.6 09/11/2014 0336   GFRNONAA >90 12/28/2014 1933   GFRAA >90 12/28/2014 1933    No results found for: SPEP, UPEP  Lab Results  Component Value Date   WBC 3.5* 05/24/2015   NEUTROABS 1.5  05/24/2015   HGB 13.5 05/24/2015   HCT 39.6 05/24/2015   MCV 90.8 05/24/2015   PLT 143* 05/24/2015      Chemistry      Component Value Date/Time   NA 140 04/17/2015 1122   NA 140 12/28/2014 1933   K 3.5 04/17/2015 1122   K 3.8 12/28/2014 1933   CL 109 12/28/2014 1933   CL 107 03/10/2013 1014   CO2 22 04/17/2015 1122   CO2 24 12/28/2014 1933   BUN 10.3 04/17/2015 1122   BUN 14 12/28/2014 1933   CREATININE 0.8 04/17/2015 1122   CREATININE 0.67 12/28/2014 1933      Component Value Date/Time   CALCIUM 8.8 04/17/2015 1122   CALCIUM 8.8 12/28/2014 1933   ALKPHOS 86 04/17/2015 1122   ALKPHOS 113 09/11/2014 0336   AST 14 04/17/2015 1122   AST 18 09/11/2014 0336   ALT 10 04/17/2015 1122   ALT 18 09/11/2014 0336   BILITOT 0.54 04/17/2015 1122   BILITOT 0.6 09/11/2014 0336      ASSESSMENT & PLAN:  Multiple myeloma Her back pain has improved since I put her back on high-dose dexamethasone. She has started palliative radiation therapy. I recommend reducing dexamethasone to 2 mg daily for a month until I see her back. I refilled her prescription for Revlimid. Next month, I plan to resume treatment with the addition of Elotuzumab.  Pathologic fracture This is related to disease. She is currently on dexamethasone. She will continue on Revlimid. Palliative radiation has started. We will consider kyphoplasty in the future once her radiation treatment is completed.  The risks, benefit, side effects of Elotuzumab was discussed with the patient and she agreed with the plan of care. I will arrange for chemotherapy pre-certification and plan start date 07/10/2015  All questions were answered. The patient knows to call the clinic with any problems, questions or concerns. No barriers to learning was detected. I spent 25 minutes counseling the patient face to face. The total time spent in the appointment was 30 minutes and more than 50% was on counseling and review of test results      [facility], Maribell Demeo, MD 06/04/2015 1:08 PM

## 2015-06-04 NOTE — Assessment & Plan Note (Signed)
Her back pain has improved since I put her back on high-dose dexamethasone. She has started palliative radiation therapy. I recommend reducing dexamethasone to 2 mg daily for a month until I see her back. I refilled her prescription for Revlimid. Next month, I plan to resume treatment with the addition of Elotuzumab.

## 2015-06-04 NOTE — Telephone Encounter (Signed)
Gave and printed appt sched and avs fo rpt for Aug and Sept °

## 2015-06-04 NOTE — Assessment & Plan Note (Signed)
This is related to disease. She is currently on dexamethasone. She will continue on Revlimid. Palliative radiation has started. We will consider kyphoplasty in the future once her radiation treatment is completed.

## 2015-06-05 ENCOUNTER — Ambulatory Visit
Admission: RE | Admit: 2015-06-05 | Discharge: 2015-06-05 | Disposition: A | Discharge status: Home / Self Care | Payer: Medicare Other | Source: Ambulatory Visit | Attending: Radiation Oncology | Admitting: Radiation Oncology

## 2015-06-05 DIAGNOSIS — Z7982 Long term (current) use of aspirin: Secondary | ICD-10-CM | POA: Diagnosis not present

## 2015-06-05 DIAGNOSIS — C9000 Multiple myeloma not having achieved remission: Secondary | ICD-10-CM | POA: Diagnosis not present

## 2015-06-05 DIAGNOSIS — Z510 Encounter for antineoplastic radiation therapy: Secondary | ICD-10-CM | POA: Diagnosis not present

## 2015-06-05 DIAGNOSIS — C801 Malignant (primary) neoplasm, unspecified: Secondary | ICD-10-CM | POA: Diagnosis not present

## 2015-06-05 DIAGNOSIS — C7951 Secondary malignant neoplasm of bone: Secondary | ICD-10-CM | POA: Diagnosis not present

## 2015-06-05 DIAGNOSIS — Z515 Encounter for palliative care: Secondary | ICD-10-CM | POA: Diagnosis not present

## 2015-06-06 ENCOUNTER — Ambulatory Visit
Admission: RE | Admit: 2015-06-06 | Discharge: 2015-06-06 | Disposition: A | Discharge status: Home / Self Care | Payer: Medicare Other | Source: Ambulatory Visit | Attending: Radiation Oncology | Admitting: Radiation Oncology

## 2015-06-06 DIAGNOSIS — Z515 Encounter for palliative care: Secondary | ICD-10-CM | POA: Diagnosis not present

## 2015-06-06 DIAGNOSIS — Z510 Encounter for antineoplastic radiation therapy: Secondary | ICD-10-CM | POA: Diagnosis not present

## 2015-06-06 DIAGNOSIS — C9000 Multiple myeloma not having achieved remission: Secondary | ICD-10-CM

## 2015-06-06 DIAGNOSIS — Z7982 Long term (current) use of aspirin: Secondary | ICD-10-CM | POA: Diagnosis not present

## 2015-06-06 MED ORDER — BIAFINE EX EMUL
CUTANEOUS | Status: DC | PRN
  Administered 2015-06-06: 10:00:00 via TOPICAL

## 2015-06-06 NOTE — Progress Notes (Signed)
Pt educatin done, radiatin therapy and you book given,my business card, biafine cream, discussed ways to manage  Side effects,symptons,,skin irritation, fatigue,pain, irritation to throat or mouth, use biafine cream daily after rad txs once skin becomes irritated or itchy, sees MD weekly and prn, increase protein in diet, drink plenty fluids, pt stated she has seen the video pt education as well, teach back given 1:26 PM

## 2015-06-07 ENCOUNTER — Ambulatory Visit
Admission: RE | Admit: 2015-06-07 | Discharge: 2015-06-07 | Disposition: A | Discharge status: Home / Self Care | Payer: Medicare Other | Source: Ambulatory Visit | Attending: Radiation Oncology | Admitting: Radiation Oncology

## 2015-06-07 VITALS — BP 143/87 | HR 70 | Temp 98.6°F | Wt 133.0 lb

## 2015-06-07 DIAGNOSIS — Z515 Encounter for palliative care: Secondary | ICD-10-CM | POA: Diagnosis not present

## 2015-06-07 DIAGNOSIS — C9000 Multiple myeloma not having achieved remission: Secondary | ICD-10-CM | POA: Diagnosis not present

## 2015-06-07 DIAGNOSIS — Z7982 Long term (current) use of aspirin: Secondary | ICD-10-CM | POA: Diagnosis not present

## 2015-06-07 DIAGNOSIS — Z510 Encounter for antineoplastic radiation therapy: Secondary | ICD-10-CM | POA: Diagnosis not present

## 2015-06-07 NOTE — Progress Notes (Signed)
Weekly assessment of radiation to cspine/tspine.completed 3 of 10 treatments.Pain on right side down right leg.Notices it more with"rain"No nausea.Ongoing fatigue.

## 2015-06-07 NOTE — Progress Notes (Signed)
Department of Radiation Oncology  Phone:  [phone] Fax:        [phone]  Weekly Treatment Note    Name: Kendra Johns Date: 06/07/2015 MRN: [id number] DOB: 08/23/1951   Current dose: 7.5 Gy  Current fraction: 3   MEDICATIONS: Current Outpatient Prescriptions  Medication Sig Dispense Refill  . aspirin 81 MG tablet Take 81 mg by mouth daily.    . Cholecalciferol (VITAMIN D3) 5000 UNITS TABS Take 5,000 mg by mouth every morning. 30 tablet 0  . cyclobenzaprine (FLEXERIL) 10 MG tablet Take 1 tablet (10 mg total) by mouth 3 (three) times daily as needed for muscle spasms. 60 tablet 0  . dexamethasone (DECADRON) 4 MG tablet Take 1 tablet (4 mg total) by mouth daily. 30 tablet 1  . ergocalciferol (VITAMIN D2) 50000 UNITS capsule Take 1 capsule (50,000 Units total) by mouth once a week. 12 capsule 3  . ipratropium (ATROVENT HFA) 17 MCG/ACT inhaler Inhale 2 puffs into the lungs every 4 (four) hours as needed for wheezing. 1 Inhaler 12  . lenalidomide (REVLIMID) 25 MG capsule TAKE ONE CAPSULE BY MOUTH EVERY DAY FOR 21 DAYS THEN 7 DAYS OFF. 21 capsule 0  . levothyroxine (SYNTHROID) 50 MCG tablet Take 1 tablet (50 mcg total) by mouth daily before breakfast. 30 tablet 3  . lidocaine (LIDODERM) 5 % Place 1 patch onto the skin daily as needed (pain). Remove & Discard patch within 12 hours or as directed by MD    . lidocaine-prilocaine (EMLA) cream Apply 1 application topically as needed. 30 g 3  . losartan (COZAAR) 50 MG tablet TAKE 1/2 TO 1 TABLET DAILY 30 tablet 4  . magnesium hydroxide (MILK OF MAGNESIA) 400 MG/5ML suspension Take 30 mLs by mouth daily as needed for mild constipation (constipation).     . morphine (MS CONTIN) 60 MG 12 hr tablet Take 60 mg by mouth every 8 (eight) hours.     Marland Kitchen morphine (MSIR) 30 MG tablet Take 30 mg by mouth every 4 (four) hours as needed for severe pain (pain).     . prochlorperazine (COMPAZINE) 10 MG tablet Take 1 tablet (10 mg total) by mouth every  6 (six) hours as needed for nausea. 60 tablet 3  . Alum & Mag Hydroxide-Simeth (MAGIC MOUTHWASH) SOLN Take 10 mLs by mouth 4 (four) times daily as needed for mouth pain. Swish and spit  Or  Swish and swallow. (Patient not taking: Reported on 06/07/2015) 300 mL 2  . [DISCONTINUED] amLODipine (NORVASC) 5 MG tablet Take 1 tablet (5 mg total) by mouth daily. 90 tablet 3  . [DISCONTINUED] gabapentin (NEURONTIN) 600 MG tablet Take 600 mg by mouth 3 (three) times daily.       No current facility-administered medications for this encounter.   Facility-Administered Medications Ordered in Other Encounters  Medication Dose Route Frequency Provider Last Rate Last Dose  . sodium chloride 0.9 % injection 10 mL  10 mL Intravenous PRN Heath Lark, MD   10 mL at 02/26/14 1425  . sodium chloride 0.9 % injection 10 mL  10 mL Intravenous PRN Chauncey Cruel, MD   10 mL at 04/05/14 1333     ALLERGIES: Codeine; Ibuprofen; and Albuterol   LABORATORY DATA:  Lab Results  Component Value Date   WBC 3.5* 05/24/2015   HGB 13.5 05/24/2015   HCT 39.6 05/24/2015   MCV 90.8 05/24/2015   PLT 143* 05/24/2015   Lab Results  Component Value Date   NA 140  04/17/2015   K 3.5 04/17/2015   CL 109 12/28/2014   CO2 22 04/17/2015   Lab Results  Component Value Date   ALT 10 04/17/2015   AST 14 04/17/2015   ALKPHOS 86 04/17/2015   BILITOT 0.54 04/17/2015     NARRATIVE: Kendra Johns was seen today for weekly treatment management. The chart was checked and the patient's films were reviewed.  Weekly assessment of radiation to cspine/tspine.completed 3 of 10 treatments.Pain on right side down right leg.Notices it more with"rain"No nausea.Ongoing fatigue. No sore throat.  PHYSICAL EXAMINATION: weight is 133 lb (60.328 kg). Her temperature is 98.6 F (37 C). Her blood pressure is 143/87 and her pulse is 70. Her oxygen saturation is 100%.        ASSESSMENT: The patient is doing satisfactorily with treatment.  PLAN:  We will continue with the patient's radiation treatment as planned.     ------------------------------------------------  Jodelle Gross, MD, PhD  This document serves as a record of services personally performed by Kyung Rudd, MD. It was created on his behalf by Derek Mound, a trained medical scribe. The creation of this record is based on the scribe's personal observations and the provider's statements to them. This document has been checked and approved by the attending provider.

## 2015-06-10 ENCOUNTER — Ambulatory Visit
Admission: RE | Admit: 2015-06-10 | Discharge: 2015-06-10 | Disposition: A | Discharge status: Home / Self Care | Payer: Medicare Other | Source: Ambulatory Visit | Attending: Radiation Oncology | Admitting: Radiation Oncology

## 2015-06-10 DIAGNOSIS — Z7982 Long term (current) use of aspirin: Secondary | ICD-10-CM | POA: Diagnosis not present

## 2015-06-10 DIAGNOSIS — Z510 Encounter for antineoplastic radiation therapy: Secondary | ICD-10-CM | POA: Diagnosis not present

## 2015-06-10 DIAGNOSIS — Z515 Encounter for palliative care: Secondary | ICD-10-CM | POA: Diagnosis not present

## 2015-06-10 DIAGNOSIS — C9000 Multiple myeloma not having achieved remission: Secondary | ICD-10-CM | POA: Diagnosis not present

## 2015-06-11 ENCOUNTER — Ambulatory Visit
Admission: RE | Admit: 2015-06-11 | Discharge: 2015-06-11 | Disposition: A | Discharge status: Home / Self Care | Payer: Medicare Other | Source: Ambulatory Visit | Attending: Radiation Oncology | Admitting: Radiation Oncology

## 2015-06-11 DIAGNOSIS — C9000 Multiple myeloma not having achieved remission: Secondary | ICD-10-CM | POA: Diagnosis not present

## 2015-06-11 DIAGNOSIS — Z510 Encounter for antineoplastic radiation therapy: Secondary | ICD-10-CM | POA: Diagnosis not present

## 2015-06-11 DIAGNOSIS — Z515 Encounter for palliative care: Secondary | ICD-10-CM | POA: Diagnosis not present

## 2015-06-11 DIAGNOSIS — Z7982 Long term (current) use of aspirin: Secondary | ICD-10-CM | POA: Diagnosis not present

## 2015-06-12 ENCOUNTER — Ambulatory Visit
Admission: RE | Admit: 2015-06-12 | Discharge: 2015-06-12 | Disposition: A | Discharge status: Home / Self Care | Payer: Medicare Other | Source: Ambulatory Visit | Attending: Radiation Oncology | Admitting: Radiation Oncology

## 2015-06-12 DIAGNOSIS — Z7982 Long term (current) use of aspirin: Secondary | ICD-10-CM | POA: Diagnosis not present

## 2015-06-12 DIAGNOSIS — Z510 Encounter for antineoplastic radiation therapy: Secondary | ICD-10-CM | POA: Diagnosis not present

## 2015-06-12 DIAGNOSIS — Z515 Encounter for palliative care: Secondary | ICD-10-CM | POA: Diagnosis not present

## 2015-06-12 DIAGNOSIS — C9000 Multiple myeloma not having achieved remission: Secondary | ICD-10-CM | POA: Diagnosis not present

## 2015-06-12 DIAGNOSIS — C7951 Secondary malignant neoplasm of bone: Secondary | ICD-10-CM | POA: Diagnosis not present

## 2015-06-12 DIAGNOSIS — C801 Malignant (primary) neoplasm, unspecified: Secondary | ICD-10-CM | POA: Diagnosis not present

## 2015-06-13 ENCOUNTER — Ambulatory Visit
Admission: RE | Admit: 2015-06-13 | Discharge: 2015-06-13 | Disposition: A | Discharge status: Home / Self Care | Payer: Medicare Other | Source: Ambulatory Visit | Attending: Radiation Oncology | Admitting: Radiation Oncology

## 2015-06-13 ENCOUNTER — Encounter: Payer: Self-pay | Admitting: Radiation Oncology

## 2015-06-13 VITALS — BP 128/86 | HR 88 | Temp 98.1°F | Resp 20 | Wt 133.9 lb

## 2015-06-13 DIAGNOSIS — Z515 Encounter for palliative care: Secondary | ICD-10-CM | POA: Diagnosis not present

## 2015-06-13 DIAGNOSIS — C9000 Multiple myeloma not having achieved remission: Secondary | ICD-10-CM | POA: Diagnosis not present

## 2015-06-13 DIAGNOSIS — Z510 Encounter for antineoplastic radiation therapy: Secondary | ICD-10-CM | POA: Diagnosis not present

## 2015-06-13 DIAGNOSIS — Z7982 Long term (current) use of aspirin: Secondary | ICD-10-CM | POA: Diagnosis not present

## 2015-06-13 NOTE — Progress Notes (Signed)
  Radiation Oncology         [phone]   Name: Kendra Johns MRN: [id number]   Date: 06/13/2015  DOB: Jul 27, 1951   Weekly Radiation Therapy Management    ICD-9-CM ICD-10-CM   1. Multiple myeloma 203.00 C90.00     Current Dose: 17.5 Gy  Planned Dose:  25 Gy  Narrative The patient presents for routine under treatment assessment. The patient is without complaint. She voiced no more pain. Denies skin changes and nausea. Reports a good appetite and is sleeping better. Set-up films were reviewed. The chart was checked.  Physical Findings  weight is 133 lb 14.4 oz (60.737 kg). Her oral temperature is 98.1 F (36.7 C). Her blood pressure is 128/86 and her pulse is 88. Her respiration is 20. . Weight essentially stable.  No significant changes.  Impression The patient is tolerating radiation.  Plan Continue treatment as planned. If on schedule, the pt should complete her treatment next Tuesday.   This document serves as a record of services personally performed by Tyler Pita, MD. It was created on his behalf by Darcus Austin, a trained medical scribe. The creation of this record is based on the scribe's personal observations and the provider's statements to them. This document has been checked and approved by the attending provider.       Sheral Apley Tammi Klippel, M.D.

## 2015-06-13 NOTE — Progress Notes (Signed)
Weekly rad tx C&-T1 spine 7/10 completed, voiced no more pain,  No skin changesno nausea, appetite good, sleeping better BP 128/86 mmHg  Pulse 88  Temp(Src) 98.1 F (36.7 C) (Oral)  Resp 20  Wt 133 lb 14.4 oz (60.737 kg)  Wt Readings from Last 3 Encounters:  06/13/15 133 lb 14.4 oz (60.737 kg)  06/07/15 133 lb (60.328 kg)  06/06/15 134 lb 1.6 oz (60.827 kg)   9:06 AM

## 2015-06-14 ENCOUNTER — Ambulatory Visit
Admission: RE | Admit: 2015-06-14 | Discharge: 2015-06-14 | Disposition: A | Discharge status: Home / Self Care | Payer: Medicare Other | Source: Ambulatory Visit | Attending: Radiation Oncology | Admitting: Radiation Oncology

## 2015-06-14 DIAGNOSIS — Z510 Encounter for antineoplastic radiation therapy: Secondary | ICD-10-CM | POA: Diagnosis not present

## 2015-06-14 DIAGNOSIS — Z515 Encounter for palliative care: Secondary | ICD-10-CM | POA: Diagnosis not present

## 2015-06-14 DIAGNOSIS — C9000 Multiple myeloma not having achieved remission: Secondary | ICD-10-CM | POA: Diagnosis not present

## 2015-06-14 DIAGNOSIS — Z7982 Long term (current) use of aspirin: Secondary | ICD-10-CM | POA: Diagnosis not present

## 2015-06-17 ENCOUNTER — Encounter: Payer: Self-pay | Admitting: Radiation Oncology

## 2015-06-17 ENCOUNTER — Ambulatory Visit
Admission: RE | Admit: 2015-06-17 | Discharge: 2015-06-17 | Disposition: A | Discharge status: Home / Self Care | Payer: Medicare Other | Source: Ambulatory Visit | Attending: Radiation Oncology | Admitting: Radiation Oncology

## 2015-06-17 ENCOUNTER — Ambulatory Visit
Admission: RE | Admit: 2015-06-17 | Discharge: 2015-06-17 | Disposition: A | Discharge status: Home / Self Care | Payer: Medicare Other | Source: Ambulatory Visit | Admitting: Radiation Oncology

## 2015-06-17 DIAGNOSIS — Z510 Encounter for antineoplastic radiation therapy: Secondary | ICD-10-CM | POA: Diagnosis not present

## 2015-06-17 DIAGNOSIS — Z515 Encounter for palliative care: Secondary | ICD-10-CM | POA: Diagnosis not present

## 2015-06-17 DIAGNOSIS — C9000 Multiple myeloma not having achieved remission: Secondary | ICD-10-CM | POA: Diagnosis not present

## 2015-06-17 DIAGNOSIS — Z7982 Long term (current) use of aspirin: Secondary | ICD-10-CM | POA: Diagnosis not present

## 2015-06-17 MED ORDER — SUCRALFATE 1 G PO TABS: 1.0000 g | ORAL_TABLET | Freq: Four times a day (QID) | ORAL | Status: DC

## 2015-06-17 NOTE — Progress Notes (Signed)
Department of Radiation Oncology  Phone:  [phone] Fax:        [phone]  Weekly Treatment Note    Name: Kendra Johns Date: 06/17/2015 MRN: [id number] DOB: 09-18-1951   Current dose: 22.5 Gy  Current fraction: 9   MEDICATIONS: Current Outpatient Prescriptions  Medication Sig Dispense Refill  . Alum & Mag Hydroxide-Simeth (MAGIC MOUTHWASH) SOLN Take 10 mLs by mouth 4 (four) times daily as needed for mouth pain. Swish and spit  Or  Swish and swallow. 300 mL 2  . aspirin 81 MG tablet Take 81 mg by mouth daily.    . Cholecalciferol (VITAMIN D3) 5000 UNITS TABS Take 5,000 mg by mouth every morning. 30 tablet 0  . cyclobenzaprine (FLEXERIL) 10 MG tablet Take 1 tablet (10 mg total) by mouth 3 (three) times daily as needed for muscle spasms. 60 tablet 0  . dexamethasone (DECADRON) 4 MG tablet Take 1 tablet (4 mg total) by mouth daily. 30 tablet 1  . ergocalciferol (VITAMIN D2) 50000 UNITS capsule Take 1 capsule (50,000 Units total) by mouth once a week. 12 capsule 3  . ipratropium (ATROVENT HFA) 17 MCG/ACT inhaler Inhale 2 puffs into the lungs every 4 (four) hours as needed for wheezing. 1 Inhaler 12  . lenalidomide (REVLIMID) 25 MG capsule TAKE ONE CAPSULE BY MOUTH EVERY DAY FOR 21 DAYS THEN 7 DAYS OFF. 21 capsule 0  . levothyroxine (SYNTHROID) 50 MCG tablet Take 1 tablet (50 mcg total) by mouth daily before breakfast. 30 tablet 3  . lidocaine (LIDODERM) 5 % Place 1 patch onto the skin daily as needed (pain). Remove & Discard patch within 12 hours or as directed by MD    . lidocaine-prilocaine (EMLA) cream Apply 1 application topically as needed. 30 g 3  . losartan (COZAAR) 50 MG tablet TAKE 1/2 TO 1 TABLET DAILY 30 tablet 4  . magnesium hydroxide (MILK OF MAGNESIA) 400 MG/5ML suspension Take 30 mLs by mouth daily as needed for mild constipation (constipation).     . morphine (MS CONTIN) 60 MG 12 hr tablet Take 60 mg by mouth every 8 (eight) hours.     Marland Kitchen morphine (MSIR) 30 MG  tablet Take 30 mg by mouth every 4 (four) hours as needed for severe pain (pain).     . prochlorperazine (COMPAZINE) 10 MG tablet Take 1 tablet (10 mg total) by mouth every 6 (six) hours as needed for nausea. 60 tablet 3  . sucralfate (CARAFATE) 1 G tablet Take 1 tablet (1 g total) by mouth 4 (four) times daily. 120 tablet 2  . [DISCONTINUED] amLODipine (NORVASC) 5 MG tablet Take 1 tablet (5 mg total) by mouth daily. 90 tablet 3  . [DISCONTINUED] gabapentin (NEURONTIN) 600 MG tablet Take 600 mg by mouth 3 (three) times daily.       No current facility-administered medications for this encounter.   Facility-Administered Medications Ordered in Other Encounters  Medication Dose Route Frequency Provider Last Rate Last Dose  . sodium chloride 0.9 % injection 10 mL  10 mL Intravenous PRN Heath Lark, MD   10 mL at 02/26/14 1425  . sodium chloride 0.9 % injection 10 mL  10 mL Intravenous PRN Chauncey Cruel, MD   10 mL at 04/05/14 1333     ALLERGIES: Codeine; Ibuprofen; and Albuterol   LABORATORY DATA:  Lab Results  Component Value Date   WBC 3.5* 05/24/2015   HGB 13.5 05/24/2015   HCT 39.6 05/24/2015   MCV 90.8 05/24/2015  PLT 143* 05/24/2015   Lab Results  Component Value Date   NA 140 04/17/2015   K 3.5 04/17/2015   CL 109 12/28/2014   CO2 22 04/17/2015   Lab Results  Component Value Date   ALT 10 04/17/2015   AST 14 04/17/2015   ALKPHOS 86 04/17/2015   BILITOT 0.54 04/17/2015     NARRATIVE: Kendra Johns was seen today for weekly treatment management. The chart was checked and the patient's films were reviewed.  Weekly assessment of radiation to cspine/tspine.completed 9 of 10 treatments. The pt complains of a sore throat, but is otherwise doing well.  PHYSICAL EXAMINATION: vitals were not taken for this visit.     alert, in no acute distress  ASSESSMENT: The patient is doing satisfactorily with treatment.  PLAN: We will continue with the patient's radiation  treatment as planned. I will prescribe Carafate for the pt's sore throat to the [facility] on [address]. She is scheduled to complete radiotherapy tomorrow.   This document serves as a record of services personally performed by Kyung Rudd, MD. It was created on his behalf by Darcus Austin, a trained medical scribe. The creation of this record is based on the scribe's personal observations and the provider's statements to them. This document has been checked and approved by the attending provider.     ------------------------------------------------  Jodelle Gross, MD, PhD

## 2015-06-18 ENCOUNTER — Encounter: Payer: Self-pay | Admitting: Radiation Oncology

## 2015-06-18 ENCOUNTER — Ambulatory Visit
Admission: RE | Admit: 2015-06-18 | Discharge: 2015-06-18 | Disposition: A | Discharge status: Home / Self Care | Payer: Medicare Other | Source: Ambulatory Visit | Attending: Radiation Oncology | Admitting: Radiation Oncology

## 2015-06-18 DIAGNOSIS — Z515 Encounter for palliative care: Secondary | ICD-10-CM | POA: Diagnosis not present

## 2015-06-18 DIAGNOSIS — Z510 Encounter for antineoplastic radiation therapy: Secondary | ICD-10-CM | POA: Diagnosis not present

## 2015-06-18 DIAGNOSIS — C9000 Multiple myeloma not having achieved remission: Secondary | ICD-10-CM | POA: Diagnosis not present

## 2015-06-18 DIAGNOSIS — Z7982 Long term (current) use of aspirin: Secondary | ICD-10-CM | POA: Diagnosis not present

## 2015-06-19 ENCOUNTER — Encounter: Payer: Self-pay | Admitting: *Deleted

## 2015-06-19 NOTE — Progress Notes (Signed)
[facility] Psychosocial Distress Screening Clinical Social Work  Clinical Social Work was referred by distress screening protocol.  The patient scored a 5 on the Psychosocial Distress Thermometer which indicates moderate distress. Clinical Social Worker attempted to contact patient to assess for distress and other psychosocial needs.   ONCBCN DISTRESS SCREENING 05/31/2015  Screening Type Initial Screening  Distress experienced in past week (1-10) 5  Emotional problem type Depression;Nervousness/Anxiety  Physical Problem type Pain;Sleep/insomnia;Getting around;Bathing/dressing;Constipation/diarrhea;Tingling hands/feet;Skin dry/itchy;Swollen arms/legs  Physician notified of physical symptoms Yes  Referral to clinical social work Yes    Clinical Social Worker follow up needed: Yes.    If yes, follow up plan: CSW left voicemail for patient to return call when convenient.  Polo Riley, MSW, LCSW, OSW-C Clinical Social Worker [facility] [phone]

## 2015-06-26 ENCOUNTER — Other Ambulatory Visit: Payer: Self-pay | Admitting: *Deleted

## 2015-06-26 ENCOUNTER — Encounter: Payer: Self-pay | Admitting: *Deleted

## 2015-06-26 DIAGNOSIS — C9000 Multiple myeloma not having achieved remission: Secondary | ICD-10-CM

## 2015-06-26 MED ORDER — LENALIDOMIDE 25 MG PO CAPS: ORAL_CAPSULE | ORAL | Status: DC

## 2015-07-01 DIAGNOSIS — M545 Low back pain: Secondary | ICD-10-CM | POA: Diagnosis not present

## 2015-07-01 DIAGNOSIS — K5900 Constipation, unspecified: Secondary | ICD-10-CM | POA: Diagnosis not present

## 2015-07-01 DIAGNOSIS — Z79891 Long term (current) use of opiate analgesic: Secondary | ICD-10-CM | POA: Diagnosis not present

## 2015-07-01 DIAGNOSIS — G894 Chronic pain syndrome: Secondary | ICD-10-CM | POA: Diagnosis not present

## 2015-07-08 NOTE — Addendum Note (Signed)
Encounter addended by: Kyung Rudd, MD on: 07/08/2015  9:28 PM<BR>     Documentation filed: Notes Section

## 2015-07-08 NOTE — Progress Notes (Signed)
  Radiation Oncology         (336) [phone] ________________________________  Name: Kendra Johns MRN: [id number]  Date: 05/31/2015  DOB: [birth year]  SIMULATION AND TREATMENT PLANNING NOTE  DIAGNOSIS:  Multiple myeloma  Site:  C7-T1  NARRATIVE:  The patient was brought to the [facility].  Identity was confirmed.  All relevant records and images related to the planned course of therapy were reviewed.   Written consent to proceed with treatment was confirmed which was freely given after reviewing the details related to the planned course of therapy had been reviewed with the patient.  Then, the patient was set-up in a stable reproducible  supine position for radiation therapy.  CT images were obtained.  Surface markings were placed.    Medically necessary complex treatment device(s) for immobilization:  Customized thermoplastic head cast.   The CT images were loaded into the planning software.  Then the target and avoidance structures were contoured.  Treatment planning then occurred.  The radiation prescription was entered and confirmed.  A total of 2 complex treatment devices were fabricated which relate to the designed radiation treatment fields. Each of these customized fields/ complex treatment devices will be used on a daily basis during the radiation course. I have requested : Isodose Plan.   PLAN:  The patient will receive 25 Gy in 10 fractions.  ________________________________   Jodelle Gross, MD, PhD

## 2015-07-08 NOTE — Progress Notes (Signed)
  Radiation Oncology         ([phone]) [phone] ________________________________  Name: Kendra Johns MRN: [id number]  Date: 06/18/2015  DOB: [birth year]  End of Treatment Note  Diagnosis:   Multiple myeloma     Indication for treatment::  palliative       Radiation treatment dates:   06/05/2015 through 06/18/2015  Site/dose:   C7-T1 spine  Narrative: The patient tolerated radiation treatment relatively well.   The patient did not have any unexpected difficulties during the course of radiation treatment.  Plan: The patient has completed radiation treatment. The patient will return to radiation oncology clinic for routine followup in one month. I advised the patient to call or return sooner if they have any questions or concerns related to their recovery or treatment. ________________________________  Jodelle Gross, M.D., Ph.D.

## 2015-07-10 ENCOUNTER — Encounter: Payer: Self-pay | Admitting: Hematology and Oncology

## 2015-07-10 ENCOUNTER — Ambulatory Visit (HOSPITAL_BASED_OUTPATIENT_CLINIC_OR_DEPARTMENT_OTHER): Payer: Medicare Other

## 2015-07-10 ENCOUNTER — Other Ambulatory Visit (HOSPITAL_BASED_OUTPATIENT_CLINIC_OR_DEPARTMENT_OTHER): Payer: Medicare Other

## 2015-07-10 ENCOUNTER — Telehealth: Payer: Self-pay | Admitting: Hematology and Oncology

## 2015-07-10 ENCOUNTER — Ambulatory Visit: Payer: Medicare Other

## 2015-07-10 ENCOUNTER — Ambulatory Visit (HOSPITAL_BASED_OUTPATIENT_CLINIC_OR_DEPARTMENT_OTHER): Payer: Medicare Other | Admitting: Hematology and Oncology

## 2015-07-10 VITALS — BP 136/86 | HR 72 | Temp 98.7°F | Resp 18

## 2015-07-10 VITALS — BP 170/96 | HR 75 | Temp 98.1°F | Resp 18 | Ht 63.0 in | Wt 142.0 lb

## 2015-07-10 DIAGNOSIS — D6959 Other secondary thrombocytopenia: Secondary | ICD-10-CM

## 2015-07-10 DIAGNOSIS — R252 Cramp and spasm: Secondary | ICD-10-CM | POA: Diagnosis not present

## 2015-07-10 DIAGNOSIS — C9000 Multiple myeloma not having achieved remission: Secondary | ICD-10-CM

## 2015-07-10 DIAGNOSIS — I10 Essential (primary) hypertension: Secondary | ICD-10-CM

## 2015-07-10 DIAGNOSIS — R110 Nausea: Secondary | ICD-10-CM

## 2015-07-10 DIAGNOSIS — Z5112 Encounter for antineoplastic immunotherapy: Secondary | ICD-10-CM | POA: Diagnosis present

## 2015-07-10 DIAGNOSIS — R5381 Other malaise: Secondary | ICD-10-CM | POA: Diagnosis not present

## 2015-07-10 DIAGNOSIS — T50905A Adverse effect of unspecified drugs, medicaments and biological substances, initial encounter: Secondary | ICD-10-CM

## 2015-07-10 DIAGNOSIS — Z95828 Presence of other vascular implants and grafts: Secondary | ICD-10-CM

## 2015-07-10 LAB — TECHNOLOGIST REVIEW

## 2015-07-10 LAB — CBC WITH DIFFERENTIAL/PLATELET
BASO%: 0.9 % (ref 0.0–2.0)
Basophils Absolute: 0.1 10*3/uL (ref 0.0–0.1)
EOS ABS: 0.1 10*3/uL (ref 0.0–0.5)
EOS%: 2.5 % (ref 0.0–7.0)
HCT: 41.7 % (ref 34.8–46.6)
HGB: 13.9 g/dL (ref 11.6–15.9)
LYMPH%: 19.5 % (ref 14.0–49.7)
MCH: 31.2 pg (ref 25.1–34.0)
MCHC: 33.3 g/dL (ref 31.5–36.0)
MCV: 93.5 fL (ref 79.5–101.0)
MONO#: 0.5 10*3/uL (ref 0.1–0.9)
MONO%: 9.4 % (ref 0.0–14.0)
NEUT#: 3.8 10*3/uL (ref 1.5–6.5)
NEUT%: 67.7 % (ref 38.4–76.8)
PLATELETS: 119 10*3/uL — AB (ref 145–400)
RBC: 4.46 10*6/uL (ref 3.70–5.45)
RDW: 18.3 % — ABNORMAL HIGH (ref 11.2–14.5)
WBC: 5.7 10*3/uL (ref 3.9–10.3)
lymph#: 1.1 10*3/uL (ref 0.9–3.3)

## 2015-07-10 MED ORDER — FAMOTIDINE IN NACL 20-0.9 MG/50ML-% IV SOLN
20.0000 mg | Freq: Once | INTRAVENOUS | Status: AC
  Administered 2015-07-10: 20 mg via INTRAVENOUS

## 2015-07-10 MED ORDER — HEPARIN SOD (PORK) LOCK FLUSH 100 UNIT/ML IV SOLN
500.0000 [IU] | Freq: Once | INTRAVENOUS | Status: AC | PRN
  Administered 2015-07-10: 500 [IU]
  Filled 2015-07-10: qty 5

## 2015-07-10 MED ORDER — ACETAMINOPHEN 325 MG PO TABS
650.0000 mg | ORAL_TABLET | Freq: Once | ORAL | Status: AC
  Administered 2015-07-10: 650 mg via ORAL

## 2015-07-10 MED ORDER — DIPHENHYDRAMINE HCL 25 MG PO CAPS
ORAL_CAPSULE | ORAL | Status: AC
  Filled 2015-07-10: qty 2

## 2015-07-10 MED ORDER — PROCHLORPERAZINE MALEATE 10 MG PO TABS: 10.0000 mg | ORAL_TABLET | Freq: Four times a day (QID) | ORAL | Status: DC | PRN

## 2015-07-10 MED ORDER — FAMOTIDINE IN NACL 20-0.9 MG/50ML-% IV SOLN
INTRAVENOUS | Status: AC
  Filled 2015-07-10: qty 50

## 2015-07-10 MED ORDER — SODIUM CHLORIDE 0.9 % IJ SOLN
10.0000 mL | INTRAMUSCULAR | Status: DC | PRN
  Administered 2015-07-10: 10 mL
  Filled 2015-07-10: qty 10

## 2015-07-10 MED ORDER — MAGIC MOUTHWASH: 10.0000 mL | Freq: Four times a day (QID) | ORAL | Status: DC | PRN

## 2015-07-10 MED ORDER — SODIUM CHLORIDE 0.9 % IV SOLN
10.0000 mg/kg | Freq: Once | INTRAVENOUS | Status: AC
  Administered 2015-07-10: 600 mg via INTRAVENOUS
  Filled 2015-07-10: qty 24

## 2015-07-10 MED ORDER — SODIUM CHLORIDE 0.9 % IV SOLN
Freq: Once | INTRAVENOUS | Status: AC
  Administered 2015-07-10: 11:00:00 via INTRAVENOUS
  Filled 2015-07-10: qty 4

## 2015-07-10 MED ORDER — DIPHENHYDRAMINE HCL 25 MG PO CAPS
50.0000 mg | ORAL_CAPSULE | Freq: Once | ORAL | Status: AC
  Administered 2015-07-10: 50 mg via ORAL

## 2015-07-10 MED ORDER — ACETAMINOPHEN 325 MG PO TABS
ORAL_TABLET | ORAL | Status: AC
  Filled 2015-07-10: qty 2

## 2015-07-10 MED ORDER — SODIUM CHLORIDE 0.9 % IJ SOLN
10.0000 mL | INTRAMUSCULAR | Status: DC | PRN
  Administered 2015-07-10: 10 mL via INTRAVENOUS
  Filled 2015-07-10: qty 10

## 2015-07-10 MED ORDER — CYCLOBENZAPRINE HCL 10 MG PO TABS: 10.0000 mg | ORAL_TABLET | Freq: Three times a day (TID) | ORAL | Status: DC | PRN

## 2015-07-10 MED ORDER — SODIUM CHLORIDE 0.9 % IV SOLN
Freq: Once | INTRAVENOUS | Status: AC
  Administered 2015-07-10: 11:00:00 via INTRAVENOUS

## 2015-07-10 NOTE — Assessment & Plan Note (Signed)
She complained of muscle cramps/spasm. I recommend a trial of muscle relaxant. 

## 2015-07-10 NOTE — Assessment & Plan Note (Signed)
Her back pain has improved since I put her back on high-dose dexamethasone. She has completed palliative radiation therapy. I recommend reducing dexamethasone to 1/2 tab daily then every other day and then stop. I will continue treatment with Revlimid along with Elotuzumab.

## 2015-07-10 NOTE — Patient Instructions (Signed)

## 2015-07-10 NOTE — Assessment & Plan Note (Signed)
I suspect her blood pressure is elevated likely related to recent weight gain and fluid retention from dexamethasone. I am weaning her off dexamethasone. If her blood pressure remained high in the next visit, I will adjust her blood pressure medications.

## 2015-07-10 NOTE — Progress Notes (Signed)
[facility] OFFICE PROGRESS NOTE  Patient Care Team: Marin Olp, MD as PCP - General (Family Medicine) Jeanann Lewandowsky, MD as Consulting Physician (Medical Oncology) Marin Olp, MD as Consulting Physician (Family Medicine) Heath Lark, MD as Consulting Physician (Hematology and Oncology)  SUMMARY OF ONCOLOGIC HISTORY: Oncology History   Multiple myeloma, kappa light chain disease, Durie-Salmon stage III     Multiple myeloma   07/16/2006 Bone Marrow Biopsy BM biopsy is non-diagnostic   09/21/2006 Procedure L5 vertebral biopsy 5% plasma cell   10/25/2006 Bone Marrow Biopsy BM biopsy is hypercellular with 5% plasma cell   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   01/03/2007 - 01/10/2007 Radiation Therapy Approximate date only, received RT for plasmacytoma followed by surgery   09/14/2007 Initial Diagnosis MULTIPLE  MYELOMA   10/05/2007 Bone Marrow Biopsy Bm biopsy was negative   06/18/2008 Bone Marrow Biopsy BM biopsy was negative   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 [facility]   04/13/2011 Relapse/Recurrence Disease relapse   04/14/2011 Bone Marrow Biopsy Bm biopsy showed 2 % plasma cell   04/27/2011 Relapse/Recurrence Disease relapse, treated with Velcade/Cytoxan/Dex   09/07/2011 - 07/28/2013 Chemotherapy She has been receiving Velcade   12/27/2011 Bone Marrow Transplant 2nd transplant at [facility]   07/28/2013 Relapse/Recurrence Chemo is stopped due to progression of disease   08/29/2013 Imaging PEt/CT showed recurrence of disease with new lesion on her rib with compression fracture   09/13/2013 Bone Marrow Biopsy BM biopsy is hypercellular with 6% plasma cell   10/10/2013 - 10/20/2013 Radiation Therapy Started on palliative XRT for rib pain   11/27/2013 - 04/06/2014 Chemotherapy The patient starts chemotherapy with Carfilzomib   07/19/2014 Imaging Repeat bloodwork and PET/CT scan shows significant disease progression.   08/02/2014 - 03/06/2015  Chemotherapy She enrolled in clinical trial using combination therapy with Revlimid, dexamethasone and Elotuzumab   03/07/2015 - 06/04/2015 Chemotherapy She is on maintenance Revlimid only without dexamethasone   05/27/2015 Imaging  MRI spine showed new compression fracture.   06/04/2015 - 06/18/2015 Radiation Therapy  she received palliative radiation therapy.   07/10/2015 -  Chemotherapy She received Elotuzumab, dex and Revlimid    INTERVAL HISTORY: Please see below for problem oriented charting. She is seen prior to the start of Elotuzumab. She completed radiation therapy well. Her back pain has resolved. She have occasional muscle spasm. She has gained weight since I put her on low-dose dexamethasone. She has very mild fluid retention. She denies recent infection  REVIEW OF SYSTEMS:   Constitutional: Denies fevers, chills or abnormal weight loss Eyes: Denies blurriness of vision Ears, nose, mouth, throat, and face: Denies mucositis or sore throat Respiratory: Denies cough, dyspnea or wheezes Cardiovascular: Denies palpitation, chest discomfort or lower extremity swelling Gastrointestinal:  Denies nausea, heartburn or change in bowel habits Skin: Denies abnormal skin rashes Lymphatics: Denies new lymphadenopathy or easy bruising Neurological:Denies numbness, tingling or new weaknesses Behavioral/Psych: Mood is stable, no new changes  All other systems were reviewed with the patient and are negative.  I have reviewed the past medical history, past surgical history, social history and family history with the patient and they are unchanged from previous note.  ALLERGIES:  is allergic to codeine; ibuprofen; and albuterol.  MEDICATIONS:  Current Outpatient Prescriptions  Medication Sig Dispense Refill  . aspirin 81 MG tablet Take 81 mg by mouth daily.    . Cholecalciferol (VITAMIN D3) 5000 UNITS TABS Take 5,000 mg by mouth every morning. 30 tablet 0  .  cyclobenzaprine (FLEXERIL) 10 MG  tablet Take 1 tablet (10 mg total) by mouth 3 (three) times daily as needed for muscle spasms. 60 tablet 0  . dexamethasone (DECADRON) 4 MG tablet Take 1 tablet (4 mg total) by mouth daily. 30 tablet 1  . ergocalciferol (VITAMIN D2) 50000 UNITS capsule Take 1 capsule (50,000 Units total) by mouth once a week. 12 capsule 3  . ipratropium (ATROVENT HFA) 17 MCG/ACT inhaler Inhale 2 puffs into the lungs every 4 (four) hours as needed for wheezing. 1 Inhaler 12  . lenalidomide (REVLIMID) 25 MG capsule TAKE ONE CAPSULE BY MOUTH EVERY DAY FOR 21 DAYS THEN 7 DAYS OFF. 21 capsule 0  . levothyroxine (SYNTHROID) 50 MCG tablet Take 1 tablet (50 mcg total) by mouth daily before breakfast. 30 tablet 3  . lidocaine-prilocaine (EMLA) cream Apply 1 application topically as needed. 30 g 3  . losartan (COZAAR) 50 MG tablet TAKE 1/2 TO 1 TABLET DAILY 30 tablet 4  . magic mouthwash SOLN Take 10 mLs by mouth 4 (four) times daily as needed for mouth pain. Swish and spit  Or  Swish and swallow. 480 mL 2  . magnesium hydroxide (MILK OF MAGNESIA) 400 MG/5ML suspension Take 30 mLs by mouth daily as needed for mild constipation (constipation).     . morphine (MS CONTIN) 60 MG 12 hr tablet Take 60 mg by mouth every 8 (eight) hours.     Marland Kitchen morphine (MSIR) 30 MG tablet Take 30 mg by mouth every 4 (four) hours as needed for severe pain (pain).     . prochlorperazine (COMPAZINE) 10 MG tablet Take 1 tablet (10 mg total) by mouth every 6 (six) hours as needed for nausea. 60 tablet 3  . sucralfate (CARAFATE) 1 G tablet Take 1 tablet (1 g total) by mouth 4 (four) times daily. 120 tablet 2  . [DISCONTINUED] amLODipine (NORVASC) 5 MG tablet Take 1 tablet (5 mg total) by mouth daily. 90 tablet 3  . [DISCONTINUED] gabapentin (NEURONTIN) 600 MG tablet Take 600 mg by mouth 3 (three) times daily.       No current facility-administered medications for this visit.   Facility-Administered Medications Ordered in Other Visits  Medication Dose  Route Frequency Provider Last Rate Last Dose  . heparin lock flush 100 unit/mL  500 Units Intracatheter Once PRN Heath Lark, MD      . sodium chloride 0.9 % injection 10 mL  10 mL Intravenous PRN Heath Lark, MD   10 mL at 02/26/14 1425  . sodium chloride 0.9 % injection 10 mL  10 mL Intravenous PRN Chauncey Cruel, MD   10 mL at 04/05/14 1333  . sodium chloride 0.9 % injection 10 mL  10 mL Intracatheter PRN Heath Lark, MD        PHYSICAL EXAMINATION: ECOG PERFORMANCE STATUS: 1 - Symptomatic but completely ambulatory  Filed Vitals:   07/10/15 1014  BP: 170/96  Pulse: 75  Temp: 98.1 F (36.7 C)  Resp: 18   Filed Weights   07/10/15 1014  Weight: 142 lb (64.411 kg)    GENERAL:alert, no distress and comfortable. She will mildly cushingoid SKIN: skin color, texture, turgor are normal, no rashes or significant lesions EYES: normal, Conjunctiva are pink and non-injected, sclera clear OROPHARYNX:no exudate, no erythema and lips, buccal mucosa, and tongue normal  NECK: supple, thyroid normal size, non-tender, without nodularity LYMPH:  no palpable lymphadenopathy in the cervical, axillary or inguinal LUNGS: clear to auscultation and percussion with normal breathing  effort HEART: regular rate & rhythm and no murmurs and no lower extremity edema ABDOMEN:abdomen soft, non-tender and normal bowel sounds Musculoskeletal:no cyanosis of digits and no clubbing  NEURO: alert & oriented x 3 with fluent speech, no focal motor/sensory deficits  LABORATORY DATA:  I have reviewed the data as listed    Component Value Date/Time   NA 140 04/17/2015 1122   NA 140 12/28/2014 1933   K 3.5 04/17/2015 1122   K 3.8 12/28/2014 1933   CL 109 12/28/2014 1933   CL 107 03/10/2013 1014   CO2 22 04/17/2015 1122   CO2 24 12/28/2014 1933   GLUCOSE 103 04/17/2015 1122   GLUCOSE 144* 12/28/2014 1933   GLUCOSE 111* 03/10/2013 1014   GLUCOSE 98 08/31/2006 1024   BUN 10.3 04/17/2015 1122   BUN 14 12/28/2014  1933   CREATININE 0.8 04/17/2015 1122   CREATININE 0.67 12/28/2014 1933   CALCIUM 8.8 04/17/2015 1122   CALCIUM 8.8 12/28/2014 1933   PROT 6.3* 04/17/2015 1122   PROT 6.6 09/11/2014 0336   ALBUMIN 3.4* 04/17/2015 1122   ALBUMIN 3.4* 09/11/2014 0336   AST 14 04/17/2015 1122   AST 18 09/11/2014 0336   ALT 10 04/17/2015 1122   ALT 18 09/11/2014 0336   ALKPHOS 86 04/17/2015 1122   ALKPHOS 113 09/11/2014 0336   BILITOT 0.54 04/17/2015 1122   BILITOT 0.6 09/11/2014 0336   GFRNONAA >90 12/28/2014 1933   GFRAA >90 12/28/2014 1933    No results found for: SPEP, UPEP  Lab Results  Component Value Date   WBC 5.7 07/10/2015   NEUTROABS 3.8 07/10/2015   HGB 13.9 07/10/2015   HCT 41.7 07/10/2015   MCV 93.5 07/10/2015   PLT 119* 07/10/2015      Chemistry      Component Value Date/Time   NA 140 04/17/2015 1122   NA 140 12/28/2014 1933   K 3.5 04/17/2015 1122   K 3.8 12/28/2014 1933   CL 109 12/28/2014 1933   CL 107 03/10/2013 1014   CO2 22 04/17/2015 1122   CO2 24 12/28/2014 1933   BUN 10.3 04/17/2015 1122   BUN 14 12/28/2014 1933   CREATININE 0.8 04/17/2015 1122   CREATININE 0.67 12/28/2014 1933      Component Value Date/Time   CALCIUM 8.8 04/17/2015 1122   CALCIUM 8.8 12/28/2014 1933   ALKPHOS 86 04/17/2015 1122   ALKPHOS 113 09/11/2014 0336   AST 14 04/17/2015 1122   AST 18 09/11/2014 0336   ALT 10 04/17/2015 1122   ALT 18 09/11/2014 0336   BILITOT 0.54 04/17/2015 1122   BILITOT 0.6 09/11/2014 0336      ASSESSMENT & PLAN:  Multiple myeloma Her back pain has improved since I put her back on high-dose dexamethasone. She has completed palliative radiation therapy. I recommend reducing dexamethasone to 1/2 tab daily then every other day and then stop. I will continue treatment with Revlimid along with Elotuzumab.  Thrombocytopenia due to drugs This is likely due to recent treatment. The patient denies recent history of bleeding such as epistaxis, hematuria or  hematochezia. She is asymptomatic from the low platelet count. I will observe for now.  she does not require transfusion now.      Muscle cramp She complained of muscle cramps/spasm. I recommend a trial of muscle relaxant.    Essential hypertension I suspect her blood pressure is elevated likely related to recent weight gain and fluid retention from dexamethasone. I am weaning her off dexamethasone.  If her blood pressure remained high in the next visit, I will adjust her blood pressure medications.   Orders Placed This Encounter  Procedures  . Comprehensive metabolic panel    Standing Status: Standing     Number of Occurrences: 9     Standing Expiration Date: 07/09/2016   All questions were answered. The patient knows to call the clinic with any problems, questions or concerns. No barriers to learning was detected. I spent 30 minutes counseling the patient face to face. The total time spent in the appointment was 40 minutes and more than 50% was on counseling and review of test results     [facility], Hanan Mcwilliams, MD 07/10/2015 1:36 PM

## 2015-07-10 NOTE — Assessment & Plan Note (Signed)
This is likely due to recent treatment. The patient denies recent history of bleeding such as epistaxis, hematuria or hematochezia. She is asymptomatic from the low platelet count. I will observe for now.  she does not require transfusion now.  

## 2015-07-10 NOTE — Telephone Encounter (Signed)
Gave adn printed appt sched and avs for pt for Sept and OCT °

## 2015-07-15 LAB — SPEP & IFE WITH QIG
ALPHA-1-GLOBULIN: 0.4 g/dL — AB (ref 0.2–0.3)
ALPHA-2-GLOBULIN: 0.8 g/dL (ref 0.5–0.9)
Albumin ELP: 3.7 g/dL — ABNORMAL LOW (ref 3.8–4.8)
BETA GLOBULIN: 0.4 g/dL (ref 0.4–0.6)
Beta 2: 0.2 g/dL (ref 0.2–0.5)
GAMMA GLOBULIN: 0.5 g/dL — AB (ref 0.8–1.7)
IgA: 45 mg/dL — ABNORMAL LOW (ref 69–380)
IgG (Immunoglobin G), Serum: 587 mg/dL — ABNORMAL LOW (ref 690–1700)
IgM, Serum: 26 mg/dL — ABNORMAL LOW (ref 52–322)
Total Protein, Serum Electrophoresis: 6 g/dL — ABNORMAL LOW (ref 6.1–8.1)

## 2015-07-15 LAB — KAPPA/LAMBDA LIGHT CHAINS
KAPPA FREE LGHT CHN: 57.1 mg/dL — AB (ref 0.33–1.94)
Kappa:Lambda Ratio: 118.96 — ABNORMAL HIGH (ref 0.26–1.65)
Lambda Free Lght Chn: 0.48 mg/dL — ABNORMAL LOW (ref 0.57–2.63)

## 2015-07-15 LAB — BETA 2 MICROGLOBULIN, SERUM: Beta-2 Microglobulin: 1.67 mg/L (ref <=2.51)

## 2015-07-23 ENCOUNTER — Other Ambulatory Visit: Payer: Self-pay | Admitting: Family Medicine

## 2015-07-24 ENCOUNTER — Ambulatory Visit: Payer: Medicare Other

## 2015-07-24 ENCOUNTER — Ambulatory Visit (HOSPITAL_BASED_OUTPATIENT_CLINIC_OR_DEPARTMENT_OTHER): Payer: Medicare Other

## 2015-07-24 ENCOUNTER — Encounter: Payer: Self-pay | Admitting: Radiation Oncology

## 2015-07-24 ENCOUNTER — Other Ambulatory Visit (HOSPITAL_BASED_OUTPATIENT_CLINIC_OR_DEPARTMENT_OTHER): Payer: Medicare Other

## 2015-07-24 VITALS — BP 147/96 | HR 82 | Temp 98.0°F | Resp 18

## 2015-07-24 DIAGNOSIS — C9000 Multiple myeloma not having achieved remission: Secondary | ICD-10-CM

## 2015-07-24 DIAGNOSIS — Z95828 Presence of other vascular implants and grafts: Secondary | ICD-10-CM

## 2015-07-24 DIAGNOSIS — Z5112 Encounter for antineoplastic immunotherapy: Secondary | ICD-10-CM

## 2015-07-24 LAB — CBC WITH DIFFERENTIAL/PLATELET
BASO%: 0.9 % (ref 0.0–2.0)
Basophils Absolute: 0 10*3/uL (ref 0.0–0.1)
EOS%: 6.6 % (ref 0.0–7.0)
Eosinophils Absolute: 0.2 10*3/uL (ref 0.0–0.5)
HEMATOCRIT: 40.6 % (ref 34.8–46.6)
HEMOGLOBIN: 13.4 g/dL (ref 11.6–15.9)
LYMPH%: 21.2 % (ref 14.0–49.7)
MCH: 31.5 pg (ref 25.1–34.0)
MCHC: 33 g/dL (ref 31.5–36.0)
MCV: 95.3 fL (ref 79.5–101.0)
MONO#: 0.5 10*3/uL (ref 0.1–0.9)
MONO%: 15.2 % — AB (ref 0.0–14.0)
NEUT#: 1.9 10*3/uL (ref 1.5–6.5)
NEUT%: 56.1 % (ref 38.4–76.8)
Platelets: 86 10*3/uL — ABNORMAL LOW (ref 145–400)
RBC: 4.26 10*6/uL (ref 3.70–5.45)
RDW: 18.5 % — AB (ref 11.2–14.5)
WBC: 3.4 10*3/uL — ABNORMAL LOW (ref 3.9–10.3)
lymph#: 0.7 10*3/uL — ABNORMAL LOW (ref 0.9–3.3)

## 2015-07-24 LAB — COMPREHENSIVE METABOLIC PANEL (CC13)
ALK PHOS: 92 U/L (ref 40–150)
ALT: 17 U/L (ref 0–55)
AST: 13 U/L (ref 5–34)
Albumin: 3.3 g/dL — ABNORMAL LOW (ref 3.5–5.0)
Anion Gap: 7 mEq/L (ref 3–11)
BUN: 11.2 mg/dL (ref 7.0–26.0)
CALCIUM: 8.7 mg/dL (ref 8.4–10.4)
CO2: 26 mEq/L (ref 22–29)
CREATININE: 0.7 mg/dL (ref 0.6–1.1)
Chloride: 108 mEq/L (ref 98–109)
EGFR: >90 mL/min/{1.73_m2} (ref >90)
GLUCOSE: 96 mg/dL (ref 70–140)
POTASSIUM: 3.7 meq/L (ref 3.5–5.1)
SODIUM: 142 meq/L (ref 136–145)
Total Bilirubin: 0.53 mg/dL (ref 0.20–1.20)
Total Protein: 5.8 g/dL — ABNORMAL LOW (ref 6.4–8.3)

## 2015-07-24 LAB — TECHNOLOGIST REVIEW

## 2015-07-24 MED ORDER — HEPARIN SOD (PORK) LOCK FLUSH 100 UNIT/ML IV SOLN
500.0000 [IU] | Freq: Once | INTRAVENOUS | Status: AC | PRN
  Administered 2015-07-24: 500 [IU]
  Filled 2015-07-24: qty 5

## 2015-07-24 MED ORDER — ACETAMINOPHEN 325 MG PO TABS
ORAL_TABLET | ORAL | Status: AC
  Filled 2015-07-24: qty 2

## 2015-07-24 MED ORDER — FAMOTIDINE IN NACL 20-0.9 MG/50ML-% IV SOLN
20.0000 mg | Freq: Once | INTRAVENOUS | Status: AC
  Administered 2015-07-24: 20 mg via INTRAVENOUS

## 2015-07-24 MED ORDER — DIPHENHYDRAMINE HCL 25 MG PO CAPS
50.0000 mg | ORAL_CAPSULE | Freq: Once | ORAL | Status: AC
  Administered 2015-07-24: 50 mg via ORAL

## 2015-07-24 MED ORDER — SODIUM CHLORIDE 0.9 % IJ SOLN
10.0000 mL | INTRAMUSCULAR | Status: DC | PRN
  Administered 2015-07-24: 10 mL
  Filled 2015-07-24: qty 10

## 2015-07-24 MED ORDER — ACETAMINOPHEN 325 MG PO TABS
650.0000 mg | ORAL_TABLET | Freq: Once | ORAL | Status: AC
  Administered 2015-07-24: 650 mg via ORAL

## 2015-07-24 MED ORDER — SODIUM CHLORIDE 0.9 % IV SOLN
10.0000 mg/kg | Freq: Once | INTRAVENOUS | Status: AC
  Administered 2015-07-24: 600 mg via INTRAVENOUS
  Filled 2015-07-24: qty 24

## 2015-07-24 MED ORDER — SODIUM CHLORIDE 0.9 % IJ SOLN
10.0000 mL | INTRAMUSCULAR | Status: DC | PRN
  Administered 2015-07-24: 10 mL via INTRAVENOUS
  Filled 2015-07-24: qty 10

## 2015-07-24 MED ORDER — SODIUM CHLORIDE 0.9 % IV SOLN
Freq: Once | INTRAVENOUS | Status: AC
  Administered 2015-07-24: 12:00:00 via INTRAVENOUS
  Filled 2015-07-24: qty 4

## 2015-07-24 MED ORDER — FAMOTIDINE IN NACL 20-0.9 MG/50ML-% IV SOLN
INTRAVENOUS | Status: AC
  Filled 2015-07-24: qty 50

## 2015-07-24 MED ORDER — SODIUM CHLORIDE 0.9 % IV SOLN
Freq: Once | INTRAVENOUS | Status: AC
  Administered 2015-07-24: 11:00:00 via INTRAVENOUS

## 2015-07-24 MED ORDER — DIPHENHYDRAMINE HCL 25 MG PO CAPS
ORAL_CAPSULE | ORAL | Status: AC
  Filled 2015-07-24: qty 2

## 2015-07-24 NOTE — Patient Instructions (Signed)
[facility] Discharge Instructions for Patients Receiving Chemotherapy  Today you received the following chemotherapy agents Empliciti.  To help prevent nausea and vomiting after your treatment, we encourage you to take your nausea medication as directed.   If you develop nausea and vomiting that is not controlled by your nausea medication, call the clinic.   BELOW ARE SYMPTOMS THAT SHOULD BE REPORTED IMMEDIATELY:  *FEVER GREATER THAN 100.5 F  *CHILLS WITH OR WITHOUT FEVER  NAUSEA AND VOMITING THAT IS NOT CONTROLLED WITH YOUR NAUSEA MEDICATION  *UNUSUAL SHORTNESS OF BREATH  *UNUSUAL BRUISING OR BLEEDING  TENDERNESS IN MOUTH AND THROAT WITH OR WITHOUT PRESENCE OF ULCERS  *URINARY PROBLEMS  *BOWEL PROBLEMS  UNUSUAL RASH Items with * indicate a potential emergency and should be followed up as soon as possible.  Feel free to call the clinic you have any questions or concerns. The clinic phone number is [phone].  Please show the CHEMO ALERT CARD at check-in to the Emergency Department and triage nurse.    

## 2015-07-24 NOTE — Patient Instructions (Signed)

## 2015-07-24 NOTE — Progress Notes (Signed)
Pt states that she has had a mild cough with clear/white flem. Pt educated to monitor any signs of an infection, such as change in sputum color, fever, etc. Pt verbalizes understanding. Dr. Alvy Bimler aware. Platelets 86, Dr. Sherren Kerns aware. Okay to proceed with treatment, no need to wait for CMET results.  Per Pharmacy okay to start at 58ml/min and increase every 69mins if VSS up to 60ml/min

## 2015-07-25 ENCOUNTER — Encounter: Payer: Self-pay | Admitting: Radiation Oncology

## 2015-07-25 ENCOUNTER — Ambulatory Visit
Admission: RE | Admit: 2015-07-25 | Discharge: 2015-07-25 | Disposition: A | Discharge status: Home / Self Care | Payer: Medicare Other | Source: Ambulatory Visit | Attending: Radiation Oncology | Admitting: Radiation Oncology

## 2015-07-25 ENCOUNTER — Other Ambulatory Visit: Payer: Self-pay | Admitting: *Deleted

## 2015-07-25 VITALS — BP 158/90 | HR 75 | Temp 98.4°F | Resp 20 | Ht 63.0 in | Wt 148.8 lb

## 2015-07-25 DIAGNOSIS — C9000 Multiple myeloma not having achieved remission: Secondary | ICD-10-CM

## 2015-07-25 HISTORY — DX: Personal history of irradiation: Z92.3

## 2015-07-25 MED ORDER — LENALIDOMIDE 25 MG PO CAPS: ORAL_CAPSULE | ORAL | Status: DC

## 2015-07-25 NOTE — Progress Notes (Signed)
Follow up s/p radiation 06/05/15-06/18/15 T1, patient pain level 8/10 right leg, but mopped her floors  Last week and over did it, pain from hip to leg, appetite good, taking revlimid on 7 day  rest period now, had chemotherapy infusion yesterday, follow up Dr. Alvy Bimler 08/07/15, moon faced, taking decadron daily , energy level not good, constipated, takes MOM prn helps stated patient BP 158/90 mmHg  Pulse 75  Temp(Src) 98.4 F (36.9 C) (Oral)  Resp 20  Ht 5\' 3"  (1.6 m)  Wt 148 lb 12.8 oz (67.495 kg)  BMI 26.37 kg/m2  SpO2 99%  Wt Readings from Last 3 Encounters:  07/25/15 148 lb 12.8 oz (67.495 kg)  07/10/15 142 lb (64.411 kg)  06/13/15 133 lb 14.4 oz (60.737 kg)   4:54 PM

## 2015-07-26 ENCOUNTER — Encounter: Payer: Self-pay | Admitting: Radiation Oncology

## 2015-07-26 NOTE — Progress Notes (Signed)
Radiation Oncology         [phone] ________________________________  Name: Kendra Johns MRN: [id number]  Date: 07/25/2015  DOB: December 30, 1950  Follow-Up Visit Note  CC: [name], MD  Heath Lark, MD  Diagnosis:   Multiple myeloma  Interval Since Last Radiation:  One month   Narrative:  The patient returns today for routine follow-up.    Follow up s/p radiation 06/05/15-06/18/15 T1, patient pain level 8/10 right leg, but mopped her floors  Last week and over did it, pain from hip to leg, appetite good, taking revlimid on 7 day  rest period now, had chemotherapy infusion yesterday, follow up Dr. Alvy Bimler 08/07/15, moon faced, taking decadron daily , energy level not good, constipated, takes MOM prn helps stated patient BP 158/90 mmHg  Pulse 75  Temp(Src) 98.4 F (36.9 C) (Oral)  Resp 20  Ht $R'5\' 3"'Qu$  (1.6 m)  Wt 148 lb 12.8 oz (67.495 kg)  BMI 26.37 kg/m2  SpO2 99%  Wt Readings from Last 3 Encounters:  07/25/15 148 lb 12.8 oz (67.495 kg)  07/10/15 142 lb (64.411 kg)  06/13/15 133 lb 14.4 oz (60.737 kg)   7:52 AM    The patient states that he ran some esophagitis after treatment. She used Carafate for this and this has since resolved.                              ALLERGIES:  is allergic to codeine; ibuprofen; and albuterol.  Meds: Current Outpatient Prescriptions  Medication Sig Dispense Refill  . aspirin 81 MG tablet Take 81 mg by mouth daily.    . Cholecalciferol (VITAMIN D3) 5000 UNITS TABS Take 5,000 mg by mouth every morning. 30 tablet 0  . cyclobenzaprine (FLEXERIL) 10 MG tablet Take 1 tablet (10 mg total) by mouth 3 (three) times daily as needed for muscle spasms. 60 tablet 0  . dexamethasone (DECADRON) 4 MG tablet Take 1 tablet (4 mg total) by mouth daily. 30 tablet 1  . ergocalciferol (VITAMIN D2) 50000 UNITS capsule Take 1 capsule (50,000 Units total) by mouth once a week. 12 capsule 3  . ipratropium (ATROVENT HFA) 17 MCG/ACT inhaler Inhale 2 puffs into the  lungs every 4 (four) hours as needed for wheezing. 1 Inhaler 12  . [START ON 08/01/2015] lenalidomide (REVLIMID) 25 MG capsule TAKE ONE CAPSULE BY MOUTH EVERY DAY FOR 21 DAYS THEN 7 DAYS OFF. 21 capsule 0  . levothyroxine (SYNTHROID) 50 MCG tablet Take 1 tablet (50 mcg total) by mouth daily before breakfast. 30 tablet 3  . lidocaine-prilocaine (EMLA) cream Apply 1 application topically as needed. 30 g 3  . losartan (COZAAR) 50 MG tablet TAKE 1/2 TO 1 TABLET DAILY 30 tablet 0  . magic mouthwash SOLN Take 10 mLs by mouth 4 (four) times daily as needed for mouth pain. Swish and spit  Or  Swish and swallow. 480 mL 2  . magnesium hydroxide (MILK OF MAGNESIA) 400 MG/5ML suspension Take 30 mLs by mouth daily as needed for mild constipation (constipation).     . morphine (MS CONTIN) 60 MG 12 hr tablet Take 60 mg by mouth every 8 (eight) hours.     Marland Kitchen morphine (MSIR) 30 MG tablet Take 30 mg by mouth every 4 (four) hours as needed for severe pain (pain).     . prochlorperazine (COMPAZINE) 10 MG tablet Take 1 tablet (10 mg total) by mouth every 6 (six) hours as  needed for nausea. 60 tablet 3  . sucralfate (CARAFATE) 1 G tablet Take 1 tablet (1 g total) by mouth 4 (four) times daily. (Patient not taking: Reported on 07/25/2015) 120 tablet 2  . [DISCONTINUED] amLODipine (NORVASC) 5 MG tablet Take 1 tablet (5 mg total) by mouth daily. 90 tablet 3  . [DISCONTINUED] gabapentin (NEURONTIN) 600 MG tablet Take 600 mg by mouth 3 (three) times daily.       No current facility-administered medications for this encounter.   Facility-Administered Medications Ordered in Other Encounters  Medication Dose Route Frequency Provider Last Rate Last Dose  . sodium chloride 0.9 % injection 10 mL  10 mL Intravenous PRN Heath Lark, MD   10 mL at 02/26/14 1425  . sodium chloride 0.9 % injection 10 mL  10 mL Intravenous PRN Chauncey Cruel, MD   10 mL at 04/05/14 1333    Physical Findings: The patient is in no acute distress.  Patient is alert and oriented.  height is $RemoveB'5\' 3"'wUvnphaS$  (1.6 m) and weight is 148 lb 12.8 oz (67.495 kg). Her oral temperature is 98.4 F (36.9 C). Her blood pressure is 158/90 and her pulse is 75. Her respiration is 20 and oxygen saturation is 99%. .     Lab Findings: Lab Results  Component Value Date   WBC 3.4* 07/24/2015   HGB 13.4 07/24/2015   HCT 40.6 07/24/2015   MCV 95.3 07/24/2015   PLT 86* 07/24/2015     Radiographic Findings: No results found.  Impression/ Plan:    The patient is doing well without any difficulties in terms of side effects/acute toxicity from treatment. We discussed possible follow-up options. The patient is continuing close follow-up with medical oncology and prefers follow-up in our clinic on a when necessary basis.     Jodelle Gross, M.D., Ph.D.

## 2015-07-29 ENCOUNTER — Telehealth: Payer: Self-pay | Admitting: *Deleted

## 2015-07-29 DIAGNOSIS — M5431 Sciatica, right side: Secondary | ICD-10-CM | POA: Diagnosis not present

## 2015-07-29 DIAGNOSIS — K5900 Constipation, unspecified: Secondary | ICD-10-CM | POA: Diagnosis not present

## 2015-07-29 DIAGNOSIS — G894 Chronic pain syndrome: Secondary | ICD-10-CM | POA: Diagnosis not present

## 2015-07-29 DIAGNOSIS — Z79891 Long term (current) use of opiate analgesic: Secondary | ICD-10-CM | POA: Diagnosis not present

## 2015-07-29 NOTE — Telephone Encounter (Signed)
Informed pt I faxed her Revlimid Refill to [facility] this past Friday.  Suggested she contact them to arrange delivery.  Call us back if any problems. She verbalized understanding.

## 2015-07-29 NOTE — Telephone Encounter (Signed)
PLEASE LET PT. KNOW WHEN PRESCRIPTION IS COMPLETED.

## 2015-07-30 ENCOUNTER — Telehealth: Payer: Self-pay | Admitting: Hematology and Oncology

## 2015-07-30 ENCOUNTER — Ambulatory Visit: Payer: Self-pay | Admitting: Hematology and Oncology

## 2015-07-30 ENCOUNTER — Other Ambulatory Visit: Payer: Self-pay | Admitting: *Deleted

## 2015-07-30 ENCOUNTER — Other Ambulatory Visit: Payer: Self-pay | Admitting: Hematology and Oncology

## 2015-07-30 ENCOUNTER — Telehealth: Payer: Self-pay | Admitting: *Deleted

## 2015-07-30 NOTE — Telephone Encounter (Signed)
Pt called to say she has been having pain in her right hip and leg for 2 weeks. States this pain is "not like normal pain, much worse" Has appt with Dr Alvy Bimler on 10/6, but feels she needs to have "this bad boy" checked sooner. Is using MS pain.

## 2015-07-30 NOTE — Telephone Encounter (Signed)
Per pof patient is aware of 9/29 appointment

## 2015-07-30 NOTE — Telephone Encounter (Signed)
Can she come in now and see me at 245 pm today? If not, I can see her Thursday at 9 or 1030 or 1145

## 2015-07-31 ENCOUNTER — Encounter: Payer: Self-pay | Admitting: Hematology and Oncology

## 2015-07-31 NOTE — Progress Notes (Signed)
Per [facility] revlimid was shipped via [employer] °

## 2015-08-01 ENCOUNTER — Other Ambulatory Visit (HOSPITAL_COMMUNITY): Payer: Self-pay | Admitting: Hematology and Oncology

## 2015-08-01 ENCOUNTER — Telehealth: Payer: Self-pay | Admitting: *Deleted

## 2015-08-01 ENCOUNTER — Ambulatory Visit (HOSPITAL_COMMUNITY)
Admission: RE | Admit: 2015-08-01 | Discharge: 2015-08-01 | Disposition: A | Discharge status: Home / Self Care | Payer: Medicare Other | Source: Ambulatory Visit | Attending: Hematology and Oncology | Admitting: Hematology and Oncology

## 2015-08-01 ENCOUNTER — Encounter: Payer: Self-pay | Admitting: Hematology and Oncology

## 2015-08-01 ENCOUNTER — Ambulatory Visit (HOSPITAL_BASED_OUTPATIENT_CLINIC_OR_DEPARTMENT_OTHER): Payer: Medicare Other | Admitting: Hematology and Oncology

## 2015-08-01 VITALS — BP 152/98 | HR 107 | Temp 98.0°F | Resp 19 | Ht 63.0 in | Wt 141.4 lb

## 2015-08-01 DIAGNOSIS — C9000 Multiple myeloma not having achieved remission: Secondary | ICD-10-CM

## 2015-08-01 DIAGNOSIS — Z23 Encounter for immunization: Secondary | ICD-10-CM | POA: Diagnosis not present

## 2015-08-01 DIAGNOSIS — M545 Low back pain, unspecified: Secondary | ICD-10-CM

## 2015-08-01 DIAGNOSIS — R200 Anesthesia of skin: Secondary | ICD-10-CM | POA: Diagnosis not present

## 2015-08-01 DIAGNOSIS — Z923 Personal history of irradiation: Secondary | ICD-10-CM | POA: Insufficient documentation

## 2015-08-01 MED ORDER — GADOBENATE DIMEGLUMINE 529 MG/ML IV SOLN: 15.0000 mL | Freq: Once | INTRAVENOUS | Status: DC | PRN

## 2015-08-01 MED ORDER — INFLUENZA VAC SPLIT QUAD 0.5 ML IM SUSY
0.5000 mL | PREFILLED_SYRINGE | Freq: Once | INTRAMUSCULAR | Status: AC
  Administered 2015-08-01: 0.5 mL via INTRAMUSCULAR
  Filled 2015-08-01: qty 0.5

## 2015-08-01 NOTE — Progress Notes (Signed)
[facility] OFFICE PROGRESS NOTE  Patient Care Team: Marin Olp, MD as PCP - General (Family Medicine) Jeanann Lewandowsky, MD as Consulting Physician (Medical Oncology) Marin Olp, MD as Consulting Physician (Family Medicine) Heath Lark, MD as Consulting Physician (Hematology and Oncology)  SUMMARY OF ONCOLOGIC HISTORY: Oncology History   Multiple myeloma, kappa light chain disease, Durie-Salmon stage III     Multiple myeloma   07/16/2006 Bone Marrow Biopsy BM biopsy is non-diagnostic   09/21/2006 Procedure L5 vertebral biopsy 5% plasma cell   10/25/2006 Bone Marrow Biopsy BM biopsy is hypercellular with 5% plasma cell   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   01/03/2007 - 01/10/2007 Radiation Therapy Approximate date only, received RT for plasmacytoma followed by surgery   09/14/2007 Initial Diagnosis MULTIPLE  MYELOMA   10/05/2007 Bone Marrow Biopsy Bm biopsy was negative   06/18/2008 Bone Marrow Biopsy BM biopsy was negative   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 [facility]   04/13/2011 Relapse/Recurrence Disease relapse   04/14/2011 Bone Marrow Biopsy Bm biopsy showed 2 % plasma cell   04/27/2011 Relapse/Recurrence Disease relapse, treated with Velcade/Cytoxan/Dex   09/07/2011 - 07/28/2013 Chemotherapy She has been receiving Velcade   12/27/2011 Bone Marrow Transplant [facility]nd [facility] at [facility]   07/28/2013 Relapse/Recurrence Chemo is stopped due to progression of disease   08/29/2013 Imaging PEt/CT showed recurrence of disease with new lesion on her rib with compression fracture   09/13/2013 Bone Marrow Biopsy BM biopsy is hypercellular with 6% plasma cell   10/10/2013 - 10/20/2013 Radiation Therapy Started on palliative XRT for rib pain   11/27/2013 - 04/06/2014 Chemotherapy The patient starts chemotherapy with Carfilzomib   07/19/2014 Imaging Repeat bloodwork and PET/CT scan shows significant disease progression.   08/02/2014 - 03/06/2015  Chemotherapy She enrolled in clinical trial using combination therapy with Revlimid, dexamethasone and Elotuzumab   03/07/2015 - 06/04/2015 Chemotherapy She is on maintenance Revlimid only without dexamethasone   05/27/2015 Imaging  MRI spine showed new compression fracture.   06/04/2015 - 06/18/2015 Radiation Therapy  she received palliative radiation therapy to 25 Gy C7-T1   07/10/2015 -  Chemotherapy She received Elotuzumab, dex and Revlimid   08/01/2015 Imaging MRI of the spine was performed today due to new onset of worsening right hip pain/flank area. MRI show mild progression of the L1 vertebral body.    INTERVAL HISTORY: Please see below for problem oriented charting. She is seen urgently today because of worsening back pain radiating down to the right thigh region. The area that hurt her the most is around the right hip region. She remember hurting her back 3 weeks ago when she tried to do some deep cleaning at the house She denies any neurological deficit. Denies any loss of bowel or bladder function. The pain is severe, rating it 8 out of 10. She needs to take her pain medication more frequently because of that.  REVIEW OF SYSTEMS:   Constitutional: Denies fevers, chills or abnormal weight loss Eyes: Denies blurriness of vision Ears, nose, mouth, throat, and face: Denies mucositis or sore throat Respiratory: Denies cough, dyspnea or wheezes Cardiovascular: Denies palpitation, chest discomfort or lower extremity swelling Gastrointestinal:  Denies nausea, heartburn or change in bowel habits Skin: Denies abnormal skin rashes Lymphatics: Denies new lymphadenopathy or easy bruising Neurological:Denies numbness, tingling or new weaknesses Behavioral/Psych: Mood is stable, no new changes  All other systems were reviewed with the patient and are negative.  I have reviewed the past medical  history, past surgical history, social history and family history with the patient and they are unchanged  from previous note.  ALLERGIES:  is allergic to codeine; ibuprofen; and albuterol.  MEDICATIONS:  Current Outpatient Prescriptions  Medication Sig Dispense Refill  . aspirin 81 MG tablet Take 81 mg by mouth daily.    . Cholecalciferol (VITAMIN D3) 5000 UNITS TABS Take 5,000 mg by mouth every morning. 30 tablet 0  . cyclobenzaprine (FLEXERIL) 10 MG tablet Take 1 tablet (10 mg total) by mouth 3 (three) times daily as needed for muscle spasms. 60 tablet 0  . dexamethasone (DECADRON) 4 MG tablet Take 1 tablet (4 mg total) by mouth daily. 30 tablet 1  . ergocalciferol (VITAMIN D2) 50000 UNITS capsule Take 1 capsule (50,000 Units total) by mouth once a week. 12 capsule 3  . ipratropium (ATROVENT HFA) 17 MCG/ACT inhaler Inhale 2 puffs into the lungs every 4 (four) hours as needed for wheezing. 1 Inhaler 12  . lenalidomide (REVLIMID) 25 MG capsule TAKE ONE CAPSULE BY MOUTH EVERY DAY FOR 21 DAYS THEN 7 DAYS OFF. 21 capsule 0  . levothyroxine (SYNTHROID) 50 MCG tablet Take 1 tablet (50 mcg total) by mouth daily before breakfast. 30 tablet 3  . lidocaine-prilocaine (EMLA) cream Apply 1 application topically as needed. 30 g 3  . losartan (COZAAR) 50 MG tablet TAKE 1/2 TO 1 TABLET DAILY 30 tablet 0  . magic mouthwash SOLN Take 10 mLs by mouth 4 (four) times daily as needed for mouth pain. Swish and spit  Or  Swish and swallow. 480 mL 2  . magnesium hydroxide (MILK OF MAGNESIA) 400 MG/5ML suspension Take 30 mLs by mouth daily as needed for mild constipation (constipation).     . morphine (MS CONTIN) 60 MG 12 hr tablet Take 60 mg by mouth every 8 (eight) hours.     Marland Kitchen morphine (MSIR) 30 MG tablet Take 30 mg by mouth every 4 (four) hours as needed for severe pain (pain).     . prochlorperazine (COMPAZINE) 10 MG tablet Take 1 tablet (10 mg total) by mouth every 6 (six) hours as needed for nausea. 60 tablet 3  . sucralfate (CARAFATE) 1 G tablet Take 1 tablet (1 g total) by mouth 4 (four) times daily. 120 tablet 2   . [DISCONTINUED] amLODipine (NORVASC) 5 MG tablet Take 1 tablet (5 mg total) by mouth daily. 90 tablet 3  . [DISCONTINUED] gabapentin (NEURONTIN) 600 MG tablet Take 600 mg by mouth 3 (three) times daily.       No current facility-administered medications for this visit.   Facility-Administered Medications Ordered in Other Visits  Medication Dose Route Frequency Provider Last Rate Last Dose  . gadobenate dimeglumine (MULTIHANCE) injection 15 mL  15 mL Intravenous Once PRN Medication Radiologist, MD      . sodium chloride 0.9 % injection 10 mL  10 mL Intravenous PRN Heath Lark, MD   10 mL at 02/26/14 1425  . sodium chloride 0.9 % injection 10 mL  10 mL Intravenous PRN Chauncey Cruel, MD   10 mL at 04/05/14 1333    PHYSICAL EXAMINATION: ECOG PERFORMANCE STATUS: 1 - Symptomatic but completely ambulatory  Filed Vitals:   08/01/15 1033  BP: 152/98  Pulse: 107  Temp: 98 F (36.7 C)  Resp: 19   Filed Weights   08/01/15 1033  Weight: 141 lb 6.4 oz (64.139 kg)    GENERAL:alert, in mild distress from pain. SKIN: skin color, texture, turgor are normal, no  rashes or significant lesions EYES: normal, Conjunctiva are pink and non-injected, sclera clear OROPHARYNX:no exudate, no erythema and lips, buccal mucosa, and tongue normal  NECK: supple, thyroid normal size, non-tender, without nodularity LYMPH:  no palpable lymphadenopathy in the cervical, axillary or inguinal LUNGS: clear to auscultation and percussion with normal breathing effort HEART: regular rate & rhythm and no murmurs and no lower extremity edema ABDOMEN:abdomen soft, non-tender and normal bowel sounds Musculoskeletal:no cyanosis of digits and no clubbing  NEURO: alert & oriented x 3 with fluent speech, no focal motor/sensory deficits. She has an antalgic gait  LABORATORY DATA:  I have reviewed the data as listed    Component Value Date/Time   NA 142 07/24/2015 1042   NA 140 12/28/2014 1933   K 3.7 07/24/2015 1042    K 3.8 12/28/2014 1933   CL 109 12/28/2014 1933   CL 107 03/10/2013 1014   CO2 26 07/24/2015 1042   CO2 24 12/28/2014 1933   GLUCOSE 96 07/24/2015 1042   GLUCOSE 144* 12/28/2014 1933   GLUCOSE 111* 03/10/2013 1014   GLUCOSE 98 08/31/2006 1024   BUN 11.2 07/24/2015 1042   BUN 14 12/28/2014 1933   CREATININE 0.7 07/24/2015 1042   CREATININE 0.67 12/28/2014 1933   CALCIUM 8.7 07/24/2015 1042   CALCIUM 8.8 12/28/2014 1933   PROT 5.8* 07/24/2015 1042   PROT 6.6 09/11/2014 0336   ALBUMIN 3.3* 07/24/2015 1042   ALBUMIN 3.4* 09/11/2014 0336   AST 13 07/24/2015 1042   AST 18 09/11/2014 0336   ALT 17 07/24/2015 1042   ALT 18 09/11/2014 0336   ALKPHOS 92 07/24/2015 1042   ALKPHOS 113 09/11/2014 0336   BILITOT 0.53 07/24/2015 1042   BILITOT 0.6 09/11/2014 0336   GFRNONAA >90 12/28/2014 1933   GFRAA >90 12/28/2014 1933    No results found for: SPEP, UPEP  Lab Results  Component Value Date   WBC 3.4* 07/24/2015   NEUTROABS 1.9 07/24/2015   HGB 13.4 07/24/2015   HCT 40.6 07/24/2015   MCV 95.3 07/24/2015   PLT 86* 07/24/2015      Chemistry      Component Value Date/Time   NA 142 07/24/2015 1042   NA 140 12/28/2014 1933   K 3.7 07/24/2015 1042   K 3.8 12/28/2014 1933   CL 109 12/28/2014 1933   CL 107 03/10/2013 1014   CO2 26 07/24/2015 1042   CO2 24 12/28/2014 1933   BUN 11.2 07/24/2015 1042   BUN 14 12/28/2014 1933   CREATININE 0.7 07/24/2015 1042   CREATININE 0.67 12/28/2014 1933      Component Value Date/Time   CALCIUM 8.7 07/24/2015 1042   CALCIUM 8.8 12/28/2014 1933   ALKPHOS 92 07/24/2015 1042   ALKPHOS 113 09/11/2014 0336   AST 13 07/24/2015 1042   AST 18 09/11/2014 0336   ALT 17 07/24/2015 1042   ALT 18 09/11/2014 0336   BILITOT 0.53 07/24/2015 1042   BILITOT 0.6 09/11/2014 0336       RADIOGRAPHIC STUDIES: I reviewed the MRI myself. I have personally reviewed the radiological images as listed and agreed with the findings in the report. Mr Total Spine  Mets Screening  08/01/2015   CLINICAL DATA:  64 year old female with multiple myeloma, spine pain radiating to the right hip and lower extremity for 3 weeks. Right lower extremity weakness and numbness.  Chemotherapy. Palliative radiation therapy to C7-T1 completed in August.  Subsequent encounter.  EXAM: MRI TOTAL SPINE WITHOUT AND WITH CONTRAST  TECHNIQUE: Multisequence MR imaging of the spine from the cervical spine to the sacrum was performed prior to and following IV contrast administration for evaluation of spinal metastatic disease.  CONTRAST:  13 mL MultiHance  COMPARISON:  Total spine exam 05/27/2015.  FINDINGS: Cervical Findings:  Grossly negative visualized brain parenchyma.  Skullbase through C3 levels appear stable and without tumor. Stable C4 compression fracture, no marrow edema or enhancement. C5 and C6 levels appear stable and without tumor. C7 level described in the thoracic section below.  No malignant cervical spinal stenosis. No cervical spinal cord signal abnormality. No cervical spine intradural enhancement. Visible paraspinal soft tissues appear stable without tumor.  Thoracic Findings:  Improved appearance of T1 status post radiation. Regressed expansile tumor. Severe compression of the T1 vertebra but no retropulsion. Continued marrow edema and enhancement not involving the posterior elements.  The adjacent C7 inferior endplate appears mildly heterogeneous but without associated enhancement to strongly suggest tumor. The C7 level otherwise remains within normal limits.  Heterogeneity of the T2 and T3 vertebral bodies is stable with patchy enhancement (T3 posterior inferior vertebral body series 13, image 6). Right T7 rib tumor partially visible on series 5, image 3 and appears stable to mildly regressed (14-15 mm diameter now versus 17 mm in July).  Otherwise no definite tumor T4 through T9.  Posterior element tumor at T10 with marrow edema and enhancement has not significantly changed. No  epidural or paraspinal extension.  No tumor identified at T11 or T12.  No thoracic spinal stenosis, spinal cord signal abnormality, or abnormal dural thickening/ enhancement.  Lumbar Findings:  Progressed L1 vertebral body tumor, subtotal vertebral body involvement now (series 9, image 6). Early pedicle involvement bilaterally. No posterior element involvement. No epidural or paraspinal extension.  Posterior inferior L3 vertebral body tumor appears stable since July. No epidural or paraspinal extension. Treated L5 pathologic fracture appears stable with mild residual edema and enhancement.  No tumor identified at L2 or L4. No central sacral tumor identified.  No malignant lumbar spinal stenosis. Cauda equina nerve roots appear within normal limits.  IMPRESSION: 1. Stable cervical spine, no definite involvement by myeloma. 2. Improved appearance of T1 status post radiation. Right T7 rib tumor also appears mildly regressed. 3. Stable lesser myelomatous involvement at T2, T3 and T10. 4. Mild progression of L1 vertebral body tumor. No epidural or paraspinal involvement. 5. L3 vertebral body involvement and treated pathologic fracture at L5 are stable. 6. No malignant spinal stenosis. No spinal cord or cauda equina abnormality.   Electronically Signed   By: Genevie Ann M.D.   On: 08/01/2015 14:47     ASSESSMENT & PLAN:  Multiple myeloma She is seen urgently today because of concern for new onset of pain. MRI shows some mild disease progression of the L1 vertebral body. No evidence of neurological deficit on exam is detected and there were no evidence of cord compression I have contacted her radiation oncologist for further evaluation to see if further radiation treatment is indicated. In the meantime, I have instructed the patient to take dexamethasone 4 mg daily until I see her back next week.  Bilateral low back pain without sciatica She has worsening pain due to progression of disease. She is currently  following another physician for pain management. She felt that recent dexamethasone appears to have helped with the pain and she continues her same. I will discuss the case with her radiation oncologist to see if further radiation therapy is indicated.  No orders of the defined types were placed in this encounter.   All questions were answered. The patient knows to call the clinic with any problems, questions or concerns. No barriers to learning was detected. I spent 25 minutes counseling the patient face to face. The total time spent in the appointment was 30 minutes and more than 50% was on counseling and review of test results     [facility], [place], MD 08/01/2015 3:31 PM

## 2015-08-01 NOTE — Telephone Encounter (Signed)
Informed pt of Dr. Calton Dach message below.  Call back if any of her symptoms worsen or any other concerns prior to next appt.. She verbalized understanding.

## 2015-08-01 NOTE — Assessment & Plan Note (Signed)
She has worsening pain due to progression of disease. She is currently following another physician for pain management. She felt that recent dexamethasone appears to have helped with the pain and she continues her same. I will discuss the case with her radiation oncologist to see if further radiation therapy is indicated.

## 2015-08-01 NOTE — Telephone Encounter (Signed)
-----   Message from Heath Lark, MD sent at 08/01/2015  3:25 PM EDT ----- Regarding: MR Pls let her know I am contacting Dr. Lisbeth Renshaw for an opinion Continue Dexamethasone for now as instructed. I told her to take 4 mg daily until I see her next week

## 2015-08-01 NOTE — Assessment & Plan Note (Signed)
She is seen urgently today because of concern for new onset of pain. MRI shows some mild disease progression of the L1 vertebral body. No evidence of neurological deficit on exam is detected and there were no evidence of cord compression I have contacted her radiation oncologist for further evaluation to see if further radiation treatment is indicated. In the meantime, I have instructed the patient to take dexamethasone 4 mg daily until I see her back next week.

## 2015-08-07 ENCOUNTER — Ambulatory Visit: Payer: Medicare Other

## 2015-08-07 ENCOUNTER — Telehealth: Payer: Self-pay | Admitting: Hematology and Oncology

## 2015-08-07 ENCOUNTER — Other Ambulatory Visit (HOSPITAL_BASED_OUTPATIENT_CLINIC_OR_DEPARTMENT_OTHER): Payer: Medicare Other

## 2015-08-07 ENCOUNTER — Ambulatory Visit (HOSPITAL_BASED_OUTPATIENT_CLINIC_OR_DEPARTMENT_OTHER): Payer: Medicare Other

## 2015-08-07 ENCOUNTER — Ambulatory Visit (HOSPITAL_BASED_OUTPATIENT_CLINIC_OR_DEPARTMENT_OTHER): Payer: Medicare Other | Admitting: Hematology and Oncology

## 2015-08-07 ENCOUNTER — Encounter: Payer: Self-pay | Admitting: Hematology and Oncology

## 2015-08-07 VITALS — BP 170/100 | HR 84 | Temp 98.2°F | Resp 20 | Ht 63.0 in | Wt 143.4 lb

## 2015-08-07 VITALS — BP 149/94 | HR 75 | Temp 97.7°F | Resp 18

## 2015-08-07 DIAGNOSIS — Z95828 Presence of other vascular implants and grafts: Secondary | ICD-10-CM

## 2015-08-07 DIAGNOSIS — I10 Essential (primary) hypertension: Secondary | ICD-10-CM | POA: Diagnosis not present

## 2015-08-07 DIAGNOSIS — Z720 Tobacco use: Secondary | ICD-10-CM

## 2015-08-07 DIAGNOSIS — C9000 Multiple myeloma not having achieved remission: Secondary | ICD-10-CM

## 2015-08-07 DIAGNOSIS — F17200 Nicotine dependence, unspecified, uncomplicated: Secondary | ICD-10-CM

## 2015-08-07 DIAGNOSIS — Z5112 Encounter for antineoplastic immunotherapy: Secondary | ICD-10-CM

## 2015-08-07 DIAGNOSIS — M545 Low back pain, unspecified: Secondary | ICD-10-CM

## 2015-08-07 DIAGNOSIS — C9002 Multiple myeloma in relapse: Secondary | ICD-10-CM

## 2015-08-07 LAB — CBC WITH DIFFERENTIAL/PLATELET
BASO%: 1.1 % (ref 0.0–2.0)
Basophils Absolute: 0.1 10*3/uL (ref 0.0–0.1)
EOS%: 6.1 % (ref 0.0–7.0)
Eosinophils Absolute: 0.4 10*3/uL (ref 0.0–0.5)
HEMATOCRIT: 41.6 % (ref 34.8–46.6)
HGB: 14.1 g/dL (ref 11.6–15.9)
LYMPH%: 21.3 % (ref 14.0–49.7)
MCH: 31.3 pg (ref 25.1–34.0)
MCHC: 33.9 g/dL (ref 31.5–36.0)
MCV: 92.4 fL (ref 79.5–101.0)
MONO#: 0.8 10*3/uL (ref 0.1–0.9)
MONO%: 12.4 % (ref 0.0–14.0)
NEUT%: 59.1 % (ref 38.4–76.8)
NEUTROS ABS: 3.9 10*3/uL (ref 1.5–6.5)
Platelets: 162 10*3/uL (ref 145–400)
RBC: 4.5 10*6/uL (ref 3.70–5.45)
RDW: 17 % — ABNORMAL HIGH (ref 11.2–14.5)
WBC: 6.5 10*3/uL (ref 3.9–10.3)
lymph#: 1.4 10*3/uL (ref 0.9–3.3)
nRBC: 1 % — ABNORMAL HIGH (ref 0–0)

## 2015-08-07 LAB — COMPREHENSIVE METABOLIC PANEL (CC13)
ALT: 13 U/L (ref 0–55)
ANION GAP: 9 meq/L (ref 3–11)
AST: 12 U/L (ref 5–34)
Albumin: 3.6 g/dL (ref 3.5–5.0)
Alkaline Phosphatase: 100 U/L (ref 40–150)
BILIRUBIN TOTAL: 0.39 mg/dL (ref 0.20–1.20)
BUN: 11.8 mg/dL (ref 7.0–26.0)
CALCIUM: 9.1 mg/dL (ref 8.4–10.4)
CO2: 23 meq/L (ref 22–29)
CREATININE: 0.8 mg/dL (ref 0.6–1.1)
Chloride: 109 mEq/L (ref 98–109)
EGFR: >90 mL/min/{1.73_m2} (ref >90)
Glucose: 106 mg/dl (ref 70–140)
Potassium: 3.3 mEq/L — ABNORMAL LOW (ref 3.5–5.1)
Sodium: 140 mEq/L (ref 136–145)
TOTAL PROTEIN: 6.2 g/dL — AB (ref 6.4–8.3)

## 2015-08-07 LAB — TECHNOLOGIST REVIEW

## 2015-08-07 MED ORDER — SODIUM CHLORIDE 0.9 % IJ SOLN
10.0000 mL | INTRAMUSCULAR | Status: DC | PRN
  Administered 2015-08-07: 10 mL via INTRAVENOUS
  Filled 2015-08-07: qty 10

## 2015-08-07 MED ORDER — DIPHENHYDRAMINE HCL 25 MG PO CAPS
50.0000 mg | ORAL_CAPSULE | Freq: Once | ORAL | Status: AC
  Administered 2015-08-07: 50 mg via ORAL

## 2015-08-07 MED ORDER — DEXAMETHASONE SODIUM PHOSPHATE 100 MG/10ML IJ SOLN
Freq: Once | INTRAMUSCULAR | Status: AC
  Administered 2015-08-07: 12:00:00 via INTRAVENOUS
  Filled 2015-08-07: qty 4

## 2015-08-07 MED ORDER — SODIUM CHLORIDE 0.9 % IV SOLN
10.0000 mg/kg | Freq: Once | INTRAVENOUS | Status: AC
  Administered 2015-08-07: 600 mg via INTRAVENOUS
  Filled 2015-08-07: qty 24

## 2015-08-07 MED ORDER — FAMOTIDINE IN NACL 20-0.9 MG/50ML-% IV SOLN
INTRAVENOUS | Status: AC
  Filled 2015-08-07: qty 50

## 2015-08-07 MED ORDER — ACETAMINOPHEN 325 MG PO TABS
ORAL_TABLET | ORAL | Status: AC
  Filled 2015-08-07: qty 2

## 2015-08-07 MED ORDER — ACETAMINOPHEN 325 MG PO TABS
650.0000 mg | ORAL_TABLET | Freq: Once | ORAL | Status: AC
  Administered 2015-08-07: 650 mg via ORAL

## 2015-08-07 MED ORDER — SODIUM CHLORIDE 0.9 % IV SOLN
Freq: Once | INTRAVENOUS | Status: AC
  Administered 2015-08-07: 11:00:00 via INTRAVENOUS

## 2015-08-07 MED ORDER — HEPARIN SOD (PORK) LOCK FLUSH 100 UNIT/ML IV SOLN
500.0000 [IU] | Freq: Once | INTRAVENOUS | Status: DC
  Filled 2015-08-07: qty 5

## 2015-08-07 MED ORDER — DIPHENHYDRAMINE HCL 25 MG PO CAPS
ORAL_CAPSULE | ORAL | Status: AC
  Filled 2015-08-07: qty 2

## 2015-08-07 MED ORDER — FAMOTIDINE IN NACL 20-0.9 MG/50ML-% IV SOLN
20.0000 mg | Freq: Once | INTRAVENOUS | Status: AC
  Administered 2015-08-07: 20 mg via INTRAVENOUS

## 2015-08-07 MED ORDER — SODIUM CHLORIDE 0.9 % IJ SOLN
10.0000 mL | INTRAMUSCULAR | Status: DC | PRN
  Administered 2015-08-07: 10 mL
  Filled 2015-08-07: qty 10

## 2015-08-07 MED ORDER — HEPARIN SOD (PORK) LOCK FLUSH 100 UNIT/ML IV SOLN
500.0000 [IU] | Freq: Once | INTRAVENOUS | Status: AC | PRN
  Administered 2015-08-07: 500 [IU]
  Filled 2015-08-07: qty 5

## 2015-08-07 NOTE — Progress Notes (Signed)
[facility] OFFICE PROGRESS NOTE  Patient Care Team: Marin Olp, MD as PCP - General (Family Medicine) Jeanann Lewandowsky, MD as Consulting Physician (Medical Oncology) Marin Olp, MD as Consulting Physician (Family Medicine) Heath Lark, MD as Consulting Physician (Hematology and Oncology)  SUMMARY OF ONCOLOGIC HISTORY: Oncology History   Multiple myeloma, kappa light chain disease, Durie-Salmon stage III     Multiple myeloma ([place])   07/16/2006 Bone Marrow Biopsy BM biopsy is non-diagnostic   09/21/2006 Procedure L5 vertebral biopsy 5% plasma cell   10/25/2006 Bone Marrow Biopsy BM biopsy is hypercellular with 5% plasma cell   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   01/03/2007 - 01/10/2007 Radiation Therapy Approximate date only, received RT for plasmacytoma followed by surgery   09/14/2007 Initial Diagnosis MULTIPLE  MYELOMA   10/05/2007 Bone Marrow Biopsy Bm biopsy was negative   06/18/2008 Bone Marrow Biopsy BM biopsy was negative   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 [facility]   04/13/2011 Relapse/Recurrence Disease relapse   04/14/2011 Bone Marrow Biopsy Bm biopsy showed 2 % plasma cell   04/27/2011 Relapse/Recurrence Disease relapse, treated with Velcade/Cytoxan/Dex   09/07/2011 - 07/28/2013 Chemotherapy She has been receiving Velcade   12/27/2011 Bone Marrow Transplant 2nd transplant at [facility]   07/28/2013 Relapse/Recurrence Chemo is stopped due to progression of disease   08/29/2013 Imaging PEt/CT showed recurrence of disease with new lesion on her rib with compression fracture   09/13/2013 Bone Marrow Biopsy BM biopsy is hypercellular with 6% plasma cell   10/10/2013 - 10/20/2013 Radiation Therapy Started on palliative XRT for rib pain   11/27/2013 - 04/06/2014 Chemotherapy The patient starts chemotherapy with Carfilzomib   07/19/2014 Imaging Repeat bloodwork and PET/CT scan shows significant disease progression.   08/02/2014 - 03/06/2015  Chemotherapy She enrolled in clinical trial using combination therapy with Revlimid, dexamethasone and Elotuzumab   03/07/2015 - 06/04/2015 Chemotherapy She is on maintenance Revlimid only without dexamethasone   05/27/2015 Imaging  MRI spine showed new compression fracture.   06/04/2015 - 06/18/2015 Radiation Therapy  she received palliative radiation therapy to 25 Gy C7-T1   07/10/2015 -  Chemotherapy She received Elotuzumab, dex and Revlimid   08/01/2015 Imaging MRI of the spine was performed today due to new onset of worsening right hip pain/flank area. MRI show mild progression of the L1 vertebral body.    INTERVAL HISTORY: Please see below for problem oriented charting. She returns for further follow-up. She continues to have severe back pain, slightly better since she started on low-dose dexamethasone. She has appointment set up to meet with the radiation oncologist again. Denies recent fevers or chills. Denies side effects from recent chemotherapy treatment.  REVIEW OF SYSTEMS:   Constitutional: Denies fevers, chills or abnormal weight loss Eyes: Denies blurriness of vision Ears, nose, mouth, throat, and face: Denies mucositis or sore throat Respiratory: Denies cough, dyspnea or wheezes Cardiovascular: Denies palpitation, chest discomfort or lower extremity swelling Gastrointestinal:  Denies nausea, heartburn or change in bowel habits Skin: Denies abnormal skin rashes Lymphatics: Denies new lymphadenopathy or easy bruising Neurological:Denies numbness, tingling or new weaknesses Behavioral/Psych: Mood is stable, no new changes  All other systems were reviewed with the patient and are negative.  I have reviewed the past medical history, past surgical history, social history and family history with the patient and they are unchanged from previous note.  ALLERGIES:  is allergic to codeine; ibuprofen; and albuterol.  MEDICATIONS:  Current Outpatient Prescriptions  Medication Sig Dispense  Refill  . aspirin 81 MG tablet Take 81 mg by mouth daily.    . Cholecalciferol (VITAMIN D3) 5000 UNITS TABS Take 5,000 mg by mouth every morning. 30 tablet 0  . cyclobenzaprine (FLEXERIL) 10 MG tablet Take 1 tablet (10 mg total) by mouth 3 (three) times daily as needed for muscle spasms. 60 tablet 0  . dexamethasone (DECADRON) 4 MG tablet Take 1 tablet (4 mg total) by mouth daily. 30 tablet 1  . ergocalciferol (VITAMIN D2) 50000 UNITS capsule Take 1 capsule (50,000 Units total) by mouth once a week. 12 capsule 3  . ipratropium (ATROVENT HFA) 17 MCG/ACT inhaler Inhale 2 puffs into the lungs every 4 (four) hours as needed for wheezing. 1 Inhaler 12  . lenalidomide (REVLIMID) 25 MG capsule TAKE ONE CAPSULE BY MOUTH EVERY DAY FOR 21 DAYS THEN 7 DAYS OFF. 21 capsule 0  . levothyroxine (SYNTHROID) 50 MCG tablet Take 1 tablet (50 mcg total) by mouth daily before breakfast. 30 tablet 3  . lidocaine-prilocaine (EMLA) cream Apply 1 application topically as needed. 30 g 3  . losartan (COZAAR) 50 MG tablet TAKE 1/2 TO 1 TABLET DAILY 30 tablet 0  . magic mouthwash SOLN Take 10 mLs by mouth 4 (four) times daily as needed for mouth pain. Swish and spit  Or  Swish and swallow. 480 mL 2  . magnesium hydroxide (MILK OF MAGNESIA) 400 MG/5ML suspension Take 30 mLs by mouth daily as needed for mild constipation (constipation).     . morphine (MS CONTIN) 60 MG 12 hr tablet Take 60 mg by mouth every 8 (eight) hours.     Marland Kitchen morphine (MSIR) 30 MG tablet Take 30 mg by mouth every 4 (four) hours as needed for severe pain (pain).     . prochlorperazine (COMPAZINE) 10 MG tablet Take 1 tablet (10 mg total) by mouth every 6 (six) hours as needed for nausea. 60 tablet 3  . sucralfate (CARAFATE) 1 G tablet Take 1 tablet (1 g total) by mouth 4 (four) times daily. 120 tablet 2  . [DISCONTINUED] amLODipine (NORVASC) 5 MG tablet Take 1 tablet (5 mg total) by mouth daily. 90 tablet 3  . [DISCONTINUED] gabapentin (NEURONTIN) 600 MG  tablet Take 600 mg by mouth 3 (three) times daily.       No current facility-administered medications for this visit.   Facility-Administered Medications Ordered in Other Visits  Medication Dose Route Frequency Provider Last Rate Last Dose  . sodium chloride 0.9 % injection 10 mL  10 mL Intravenous PRN Heath Lark, MD   10 mL at 02/26/14 1425  . sodium chloride 0.9 % injection 10 mL  10 mL Intravenous PRN Chauncey Cruel, MD   10 mL at 04/05/14 1333    PHYSICAL EXAMINATION: ECOG PERFORMANCE STATUS: 2 - Symptomatic, <50% confined to bed  Filed Vitals:   08/07/15 0955  BP: 170/100  Pulse: 84  Temp: 98.2 F (36.8 C)  Resp: 20   Filed Weights   08/07/15 0955  Weight: 143 lb 6.4 oz (65.046 kg)    GENERAL:alert, appears uncomfortable and in mild distress from pain. SKIN: skin color, texture, turgor are normal, no rashes or significant lesions EYES: normal, Conjunctiva are pink and non-injected, sclera clear OROPHARYNX:no exudate, no erythema and lips, buccal mucosa, and tongue normal  NECK: supple, thyroid normal size, non-tender, without nodularity LYMPH:  no palpable lymphadenopathy in the cervical, axillary or inguinal LUNGS: clear to auscultation and percussion with normal breathing effort HEART: regular rate &  rhythm and no murmurs and no lower extremity edema ABDOMEN:abdomen soft, non-tender and normal bowel sounds Musculoskeletal:no cyanosis of digits and no clubbing  NEURO: alert & oriented x 3 with fluent speech, no focal motor/sensory deficits  LABORATORY DATA:  I have reviewed the data as listed    Component Value Date/Time   NA 142 07/24/2015 1042   NA 140 12/28/2014 1933   K 3.7 07/24/2015 1042   K 3.8 12/28/2014 1933   CL 109 12/28/2014 1933   CL 107 03/10/2013 1014   CO2 26 07/24/2015 1042   CO2 24 12/28/2014 1933   GLUCOSE 96 07/24/2015 1042   GLUCOSE 144* 12/28/2014 1933   GLUCOSE 111* 03/10/2013 1014   GLUCOSE 98 08/31/2006 1024   BUN 11.2 07/24/2015  1042   BUN 14 12/28/2014 1933   CREATININE 0.7 07/24/2015 1042   CREATININE 0.67 12/28/2014 1933   CALCIUM 8.7 07/24/2015 1042   CALCIUM 8.8 12/28/2014 1933   PROT 5.8* 07/24/2015 1042   PROT 6.6 09/11/2014 0336   ALBUMIN 3.3* 07/24/2015 1042   ALBUMIN 3.4* 09/11/2014 0336   AST 13 07/24/2015 1042   AST 18 09/11/2014 0336   ALT 17 07/24/2015 1042   ALT 18 09/11/2014 0336   ALKPHOS 92 07/24/2015 1042   ALKPHOS 113 09/11/2014 0336   BILITOT 0.53 07/24/2015 1042   BILITOT 0.6 09/11/2014 0336   GFRNONAA >90 12/28/2014 1933   GFRAA >90 12/28/2014 1933    No results found for: SPEP, UPEP  Lab Results  Component Value Date   WBC 6.5 08/07/2015   NEUTROABS 3.9 08/07/2015   HGB 14.1 08/07/2015   HCT 41.6 08/07/2015   MCV 92.4 08/07/2015   PLT 162 08/07/2015      Chemistry      Component Value Date/Time   NA 142 07/24/2015 1042   NA 140 12/28/2014 1933   K 3.7 07/24/2015 1042   K 3.8 12/28/2014 1933   CL 109 12/28/2014 1933   CL 107 03/10/2013 1014   CO2 26 07/24/2015 1042   CO2 24 12/28/2014 1933   BUN 11.2 07/24/2015 1042   BUN 14 12/28/2014 1933   CREATININE 0.7 07/24/2015 1042   CREATININE 0.67 12/28/2014 1933      Component Value Date/Time   CALCIUM 8.7 07/24/2015 1042   CALCIUM 8.8 12/28/2014 1933   ALKPHOS 92 07/24/2015 1042   ALKPHOS 113 09/11/2014 0336   AST 13 07/24/2015 1042   AST 18 09/11/2014 0336   ALT 17 07/24/2015 1042   ALT 18 09/11/2014 0336   BILITOT 0.53 07/24/2015 1042   BILITOT 0.6 09/11/2014 0336       RADIOGRAPHIC STUDIES: I reviewed the MRI with the patient I have personally reviewed the radiological images as listed and agreed with the findings in the report.    ASSESSMENT & PLAN:  Multiple myeloma MRI shows some mild disease progression of the L1 vertebral body. No evidence of neurological deficit on exam is detected and there were no evidence of cord compression She is doing better since I started her on low-dose dexamethasone  4 mg daily. Radiation appointment has been arranged. I will continue chemotherapy treatment and plan to see her back next month for further assessment.  Bilateral low back pain without sciatica She has worsening pain due to focal progression of disease. She is currently following another physician for pain management. She felt that recent dexamethasone appears to have helped with the pain and she continues her same. She will meet with radiation  oncologist for possible further radiation treatment.     Smoker I spent some time counseling the patient the importance of tobacco cessation. She is currently not interested to quit now.     Essential hypertension Her blood pressure is grossly elevated which is suspect was exacerbated by back pain. I would not adjust her medication for now until her pain appears to be under control.     Orders Placed This Encounter  Procedures  . SPEP & IFE with QIG    Standing Status: Future     Number of Occurrences:      Standing Expiration Date: 09/10/2016  . Kappa/lambda light chains    Standing Status: Future     Number of Occurrences:      Standing Expiration Date: 09/10/2016   All questions were answered. The patient knows to call the clinic with any problems, questions or concerns. No barriers to learning was detected. I spent 25 minutes counseling the patient face to face. The total time spent in the appointment was 30 minutes and more than 50% was on counseling and review of test results     [facility], [place], MD 08/07/2015 10:22 AM

## 2015-08-07 NOTE — Assessment & Plan Note (Signed)
She has worsening pain due to focal progression of disease. She is currently following another physician for pain management. She felt that recent dexamethasone appears to have helped with the pain and she continues her same. She will meet with radiation oncologist for possible further radiation treatment.

## 2015-08-07 NOTE — Assessment & Plan Note (Signed)
MRI shows some mild disease progression of the L1 vertebral body. No evidence of neurological deficit on exam is detected and there were no evidence of cord compression She is doing better since I started her on low-dose dexamethasone 4 mg daily. Radiation appointment has been arranged. I will continue chemotherapy treatment and plan to see her back next month for further assessment.

## 2015-08-07 NOTE — Telephone Encounter (Signed)
Gave patient avs report and appointments for October and November.  °

## 2015-08-07 NOTE — Patient Instructions (Signed)
Cancer Center Discharge Instructions for Patients Receiving Chemotherapy  Today you received the following chemotherapy agents Empliciti.  To help prevent nausea and vomiting after your treatment, we encourage you to take your nausea medication as directed.   If you develop nausea and vomiting that is not controlled by your nausea medication, call the clinic.   BELOW ARE SYMPTOMS THAT SHOULD BE REPORTED IMMEDIATELY:  *FEVER GREATER THAN 100.5 F  *CHILLS WITH OR WITHOUT FEVER  NAUSEA AND VOMITING THAT IS NOT CONTROLLED WITH YOUR NAUSEA MEDICATION  *UNUSUAL SHORTNESS OF BREATH  *UNUSUAL BRUISING OR BLEEDING  TENDERNESS IN MOUTH AND THROAT WITH OR WITHOUT PRESENCE OF ULCERS  *URINARY PROBLEMS  *BOWEL PROBLEMS  UNUSUAL RASH Items with * indicate a potential emergency and should be followed up as soon as possible.  Feel free to call the clinic you have any questions or concerns. The clinic phone number is [phone].  Please show the CHEMO ALERT CARD at check-in to the Emergency Department and triage nurse.    

## 2015-08-07 NOTE — Patient Instructions (Signed)

## 2015-08-07 NOTE — Assessment & Plan Note (Signed)
I spent some time counseling the patient the importance of tobacco cessation. She is currently not interested to quit now. 

## 2015-08-07 NOTE — Assessment & Plan Note (Signed)
Her blood pressure is grossly elevated which is suspect was exacerbated by back pain. I would not adjust her medication for now until her pain appears to be under control.

## 2015-08-12 NOTE — Progress Notes (Signed)
Histology and Location of Primary Cancer: Multiple Myeloma with progression to L1 (RECON)  Sites of Visceral and Bony Metastatic Disease:L1    Location(s) of Symptomatic Metastases: Patient denies back pain at this time. She does complain of severe pain in her right upper leg. She states it is a 7, and she is taking MsContin 60 mg every 8 hours, and MSIR 30 mg every 4 hours.   Past/Anticipated chemotherapy by medical oncology, if any: Dr. Alvy Bimler saw patient on 08/07/15 and ordered an MRI  Total Spine Mets screening was on 08/01/15. She is currently receiving chemotherapy every 2 weeks with Dr. Alvy Bimler, and is due next Wednesday 10/19.  Pain on a scale of 0-10 is: Following another Physician for pain management. She states a 7/10 in right upper leg, and is using pain medicine as stated above with MsContin and MSIR.   If Spine Met(s), symptoms, if any, include:  Bowel/Bladder retention or incontinence: No  Numbness or weakness in extremities She reports numbness, weakness, and swelling in right lower extremity. She does report that leg feels cold, when "moving around too much".  Current Decadron regimen, if applicable: $RemoveBefore'4mg'[id number]$  oral daily   Ambulatory status? Walker? Wheelchair?: Pt is using a cane to ambulate  SAFETY ISSUES:  Prior radiation? YES, 06/05/2015-06/18/2015 C7-T1 Spine, palliative, 10/09/13-10/20/13 Palliative  T10/T11, 62fx, Right 7th  Rib 25Gy 26fx  Pacemaker/ICD? No Is the patient on methotrexate? No  Current Complaints / other details:  Still smoking cigarettes

## 2015-08-14 ENCOUNTER — Ambulatory Visit
Admission: RE | Admit: 2015-08-14 | Discharge: 2015-08-14 | Disposition: A | Discharge status: Home / Self Care | Payer: Medicare Other | Source: Ambulatory Visit | Attending: Radiation Oncology | Admitting: Radiation Oncology

## 2015-08-14 ENCOUNTER — Ambulatory Visit (HOSPITAL_COMMUNITY)
Admission: RE | Admit: 2015-08-14 | Discharge: 2015-08-14 | Disposition: A | Discharge status: Home / Self Care | Payer: Medicare Other | Source: Ambulatory Visit | Attending: Radiation Oncology | Admitting: Radiation Oncology

## 2015-08-14 ENCOUNTER — Other Ambulatory Visit: Payer: Self-pay | Admitting: Hematology and Oncology

## 2015-08-14 ENCOUNTER — Encounter: Payer: Self-pay | Admitting: Radiation Oncology

## 2015-08-14 ENCOUNTER — Other Ambulatory Visit: Payer: Self-pay | Admitting: Radiation Oncology

## 2015-08-14 VITALS — BP 145/92 | HR 77 | Temp 98.2°F | Ht 63.0 in | Wt 144.3 lb

## 2015-08-14 DIAGNOSIS — C9000 Multiple myeloma not having achieved remission: Secondary | ICD-10-CM | POA: Diagnosis not present

## 2015-08-14 DIAGNOSIS — Z9221 Personal history of antineoplastic chemotherapy: Secondary | ICD-10-CM | POA: Diagnosis not present

## 2015-08-14 DIAGNOSIS — M25561 Pain in right knee: Secondary | ICD-10-CM | POA: Diagnosis not present

## 2015-08-14 DIAGNOSIS — M8589 Other specified disorders of bone density and structure, multiple sites: Secondary | ICD-10-CM | POA: Diagnosis not present

## 2015-08-14 DIAGNOSIS — M25551 Pain in right hip: Secondary | ICD-10-CM | POA: Diagnosis not present

## 2015-08-14 DIAGNOSIS — Z510 Encounter for antineoplastic radiation therapy: Secondary | ICD-10-CM | POA: Insufficient documentation

## 2015-08-14 DIAGNOSIS — M179 Osteoarthritis of knee, unspecified: Secondary | ICD-10-CM | POA: Diagnosis not present

## 2015-08-14 NOTE — Progress Notes (Signed)
Radiation Oncology         [phone] ________________________________  Name: Kendra Johns MRN: [id number]  Date: 08/14/2015  DOB: Jun 13, 1951  Reconsultation Note  CC: [name], MD  Heath Lark, MD  Diagnosis: Multiple Myeloma with progression to L1  Interval Since Last Radiation:  2 months   Narrative:  The patient returns today for reconsultation of disease progression to L1 spine.  Patient denies back pain at this time despite having severe lumbar spine pain in the past. She does complain of severe pain in her right upper leg. She states it is a 7 and hurts all the time. She is taking MsContin 60 mg every 8 hours and MSIR 30 mg every 4 hours. She denies any similar pain in the left leg.   Dr. Alvy Bimler saw patient on 08/07/15 and ordered an MRI Total Spine Mets screening was on 08/01/15. She is currently receiving chemotherapy every 2 weeks with Dr. Alvy Bimler, and is due next Wednesday 10/19.                         ALLERGIES:  is allergic to codeine; ibuprofen; and albuterol.  Meds: Current Outpatient Prescriptions  Medication Sig Dispense Refill  . aspirin 81 MG tablet Take 81 mg by mouth daily.    . Cholecalciferol (VITAMIN D3) 5000 UNITS TABS Take 5,000 mg by mouth every morning. 30 tablet 0  . cyclobenzaprine (FLEXERIL) 10 MG tablet Take 1 tablet (10 mg total) by mouth 3 (three) times daily as needed for muscle spasms. 60 tablet 0  . dexamethasone (DECADRON) 4 MG tablet TAKE 1 TABLET (4 MG TOTAL) BY MOUTH DAILY. 30 tablet 1  . ergocalciferol (VITAMIN D2) 50000 UNITS capsule Take 1 capsule (50,000 Units total) by mouth once a week. 12 capsule 3  . ipratropium (ATROVENT HFA) 17 MCG/ACT inhaler Inhale 2 puffs into the lungs every 4 (four) hours as needed for wheezing. 1 Inhaler 12  . lenalidomide (REVLIMID) 25 MG capsule TAKE ONE CAPSULE BY MOUTH EVERY DAY FOR 21 DAYS THEN 7 DAYS OFF. 21 capsule 0  . levothyroxine (SYNTHROID) 50 MCG tablet Take 1 tablet (50 mcg total) by  mouth daily before breakfast. 30 tablet 3  . lidocaine-prilocaine (EMLA) cream Apply 1 application topically as needed. 30 g 3  . losartan (COZAAR) 50 MG tablet TAKE 1/2 TO 1 TABLET DAILY 30 tablet 0  . magic mouthwash SOLN Take 10 mLs by mouth 4 (four) times daily as needed for mouth pain. Swish and spit  Or  Swish and swallow. 480 mL 2  . magnesium hydroxide (MILK OF MAGNESIA) 400 MG/5ML suspension Take 30 mLs by mouth daily as needed for mild constipation (constipation).     . morphine (MS CONTIN) 60 MG 12 hr tablet Take 60 mg by mouth every 8 (eight) hours.     Marland Kitchen morphine (MSIR) 30 MG tablet Take 30 mg by mouth every 4 (four) hours as needed for severe pain (pain).     . prochlorperazine (COMPAZINE) 10 MG tablet Take 1 tablet (10 mg total) by mouth every 6 (six) hours as needed for nausea. 60 tablet 3  . sucralfate (CARAFATE) 1 G tablet Take 1 tablet (1 g total) by mouth 4 (four) times daily. 120 tablet 2  . [DISCONTINUED] amLODipine (NORVASC) 5 MG tablet Take 1 tablet (5 mg total) by mouth daily. 90 tablet 3  . [DISCONTINUED] gabapentin (NEURONTIN) 600 MG tablet Take 600 mg by mouth 3 (three)  times daily.       No current facility-administered medications for this encounter.   Facility-Administered Medications Ordered in Other Encounters  Medication Dose Route Frequency Provider Last Rate Last Dose  . sodium chloride 0.9 % injection 10 mL  10 mL Intravenous PRN Heath Lark, MD   10 mL at 02/26/14 1425  . sodium chloride 0.9 % injection 10 mL  10 mL Intravenous PRN Chauncey Cruel, MD   10 mL at 04/05/14 1333    Physical Findings: The patient is in no acute distress. Patient is alert and oriented.  height is _0  (1.6 m) and weight is 144 lb 4.8 oz (65.454 kg). Her temperature is 98.2 F (36.8 C). Her blood pressure is 145/92 and her pulse is 77. Marland Kitchen   5/5 strength bilaterally in the lower extremities   Lab Findings: Lab Results  Component Value Date   WBC 6.5 08/07/2015   HGB 14.1  08/07/2015   HCT 41.6 08/07/2015   MCV 92.4 08/07/2015   PLT 162 08/07/2015     Radiographic Findings: Mr Total Spine Mets Screening  08/01/2015  CLINICAL DATA:  64 year old female with multiple myeloma, spine pain radiating to the right hip and lower extremity for 3 weeks. Right lower extremity weakness and numbness. Chemotherapy. Palliative radiation therapy to C7-T1 completed in August. Subsequent encounter. EXAM: MRI TOTAL SPINE WITHOUT AND WITH CONTRAST TECHNIQUE: Multisequence MR imaging of the spine from the cervical spine to the sacrum was performed prior to and following IV contrast administration for evaluation of spinal metastatic disease. CONTRAST:  13 mL MultiHance COMPARISON:  Total spine exam 05/27/2015. FINDINGS: Cervical Findings: Grossly negative visualized brain parenchyma. Skullbase through C3 levels appear stable and without tumor. Stable C4 compression fracture, no marrow edema or enhancement. C5 and C6 levels appear stable and without tumor. C7 level described in the thoracic section below. No malignant cervical spinal stenosis. No cervical spinal cord signal abnormality. No cervical spine intradural enhancement. Visible paraspinal soft tissues appear stable without tumor. Thoracic Findings: Improved appearance of T1 status post radiation. Regressed expansile tumor. Severe compression of the T1 vertebra but no retropulsion. Continued marrow edema and enhancement not involving the posterior elements. The adjacent C7 inferior endplate appears mildly heterogeneous but without associated enhancement to strongly suggest tumor. The C7 level otherwise remains within normal limits. Heterogeneity of the T2 and T3 vertebral bodies is stable with patchy enhancement (T3 posterior inferior vertebral body series 13, image 6). Right T7 rib tumor partially visible on series 5, image 3 and appears stable to mildly regressed (14-15 mm diameter now versus 17 mm in July). Otherwise no definite tumor T4  through T9. Posterior element tumor at T10 with marrow edema and enhancement has not significantly changed. No epidural or paraspinal extension. No tumor identified at T11 or T12. No thoracic spinal stenosis, spinal cord signal abnormality, or abnormal dural thickening/ enhancement. Lumbar Findings: Progressed L1 vertebral body tumor, subtotal vertebral body involvement now (series 9, image 6). Early pedicle involvement bilaterally. No posterior element involvement. No epidural or paraspinal extension. Posterior inferior L3 vertebral body tumor appears stable since July. No epidural or paraspinal extension. Treated L5 pathologic fracture appears stable with mild residual edema and enhancement. No tumor identified at L2 or L4. No central sacral tumor identified. No malignant lumbar spinal stenosis. Cauda equina nerve roots appear within normal limits. IMPRESSION: 1. Stable cervical spine, no definite involvement by myeloma. 2. Improved appearance of T1 status post radiation. Right T7 rib tumor also appears  mildly regressed. 3. Stable lesser myelomatous involvement at T2, T3 and T10. 4. Mild progression of L1 vertebral body tumor. No epidural or paraspinal involvement. 5. L3 vertebral body involvement and treated pathologic fracture at L5 are stable. 6. No malignant spinal stenosis. No spinal cord or cauda equina abnormality. Electronically Signed   By: Genevie Ann M.D.   On: 08/01/2015 14:47   Dg Hip Unilat With Pelvis 2-3 Views Right  08/14/2015  CLINICAL DATA:  Pain.  Multiple myeloma. EXAM: DG HIP (WITH OR WITHOUT PELVIS) 2-3V RIGHT COMPARISON:  None. FINDINGS: Degenerative changes lumbar spine and both hips. Diffuse progress osteopenia. This may be secondary to myeloma. Prominent lytic lesions proximal right femoral shaft. Scratched lytic lesion right iliac wing cannot be excluded. Methylmethacrylate again noted L5 and in the right sacral wing. Pelvic calcifications consistent phleboliths . IMPRESSION:  Progressive diffuse osteopenia with lytic lesions in the right proximal femur and and possibly the right iliac wing. Multiple myeloma could present in this fashion . No evidence of fracture. The proximal right femoral shaft lytic lesion is prominent and would be prone to fracture. Orthopedic/radiation therapy consultation should be considered . Electronically Signed   By: Marcello Moores  Register   On: 08/14/2015 13:38   Dg Femur, Min 2 Views Right  08/14/2015  CLINICAL DATA:  Two weeks of right knee pain without reported trauma; multiple myeloma not in remission, currently on chemotherapy. EXAM: RIGHT FEMUR 2 VIEWS COMPARISON:  Images of the right femur and right knee dated May 24, 2014 FINDINGS: There is a5 mm diameter well-circumscribed lucency in the distal third of shaft of the right femur which is stable. Lucencies previously demonstrated more inferiorly are less well defined and may have increased in size. No cortical disruption is observed. The knee joint compartments appear reasonably well maintained. There is beaking of the tibial spines and tiny spurs from the articular margins of the patella. IMPRESSION: Lucencies in the distal third of the femur which likely reflect myelomatous involvement. There are mild degenerative changes of the right knee. Electronically Signed   By: David  Martinique M.D.   On: 08/14/2015 13:37    Impression: Patient's recent imaging has revealed mild progression of L1 vertebral body tumor. The patient has developed significant pain in the right hip and right upper leg.    Plan:  I will schedule an X-ray of the hip and right upper leg for today. I will follow up with the patient on the results of the imaging as soon as possible.   The patient may be a candidate for radiosurgery to the back given some progression in the L1vertebral body. However, the patient denies significant pain here today at this location but does complain of significant pain in the right femur as well as in  the right hip. We will see what the x-rays demonstrate and then proceed accordingly.  ------------------------------------------------  Jodelle Gross, MD, PhD  This document serves as a record of services personally performed by Kyung Rudd, MD. It was created on his behalf by Derek Mound, a trained medical scribe. The creation of this record is based on the scribe's personal observations and the provider's statements to them. This document has been checked and approved by the attending provider.

## 2015-08-16 ENCOUNTER — Ambulatory Visit
Admission: RE | Admit: 2015-08-16 | Discharge: 2015-08-16 | Disposition: A | Discharge status: Home / Self Care | Payer: Medicare Other | Source: Ambulatory Visit | Attending: Radiation Oncology | Admitting: Radiation Oncology

## 2015-08-16 DIAGNOSIS — C9002 Multiple myeloma in relapse: Secondary | ICD-10-CM | POA: Diagnosis not present

## 2015-08-16 DIAGNOSIS — Z510 Encounter for antineoplastic radiation therapy: Secondary | ICD-10-CM | POA: Diagnosis not present

## 2015-08-21 ENCOUNTER — Encounter (HOSPITAL_COMMUNITY): Payer: Self-pay

## 2015-08-21 ENCOUNTER — Other Ambulatory Visit: Payer: Self-pay

## 2015-08-21 ENCOUNTER — Inpatient Hospital Stay (HOSPITAL_COMMUNITY)
Admission: EM | Admit: 2015-08-21 | Discharge: 2015-08-26 | DRG: 481 | Disposition: A | Discharge status: Skilled Nursing Facility | Payer: Medicare Other | Attending: Internal Medicine | Admitting: Internal Medicine

## 2015-08-21 ENCOUNTER — Ambulatory Visit: Payer: Self-pay

## 2015-08-21 ENCOUNTER — Ambulatory Visit: Payer: Medicare Other | Admitting: Radiation Oncology

## 2015-08-21 ENCOUNTER — Telehealth: Payer: Self-pay | Admitting: *Deleted

## 2015-08-21 ENCOUNTER — Ambulatory Visit
Admit: 2015-08-21 | Discharge: 2015-08-21 | Disposition: A | Discharge status: Home / Self Care | Payer: Medicare Other | Attending: Radiation Oncology | Admitting: Radiation Oncology

## 2015-08-21 ENCOUNTER — Emergency Department (HOSPITAL_COMMUNITY): Payer: Medicare Other

## 2015-08-21 DIAGNOSIS — Z7901 Long term (current) use of anticoagulants: Secondary | ICD-10-CM | POA: Diagnosis not present

## 2015-08-21 DIAGNOSIS — Z5181 Encounter for therapeutic drug level monitoring: Secondary | ICD-10-CM | POA: Diagnosis not present

## 2015-08-21 DIAGNOSIS — E039 Hypothyroidism, unspecified: Secondary | ICD-10-CM | POA: Diagnosis present

## 2015-08-21 DIAGNOSIS — G4730 Sleep apnea, unspecified: Secondary | ICD-10-CM | POA: Diagnosis present

## 2015-08-21 DIAGNOSIS — Z7952 Long term (current) use of systemic steroids: Secondary | ICD-10-CM

## 2015-08-21 DIAGNOSIS — Z515 Encounter for palliative care: Secondary | ICD-10-CM | POA: Diagnosis not present

## 2015-08-21 DIAGNOSIS — D696 Thrombocytopenia, unspecified: Secondary | ICD-10-CM | POA: Diagnosis present

## 2015-08-21 DIAGNOSIS — Z9889 Other specified postprocedural states: Secondary | ICD-10-CM | POA: Diagnosis not present

## 2015-08-21 DIAGNOSIS — C9002 Multiple myeloma in relapse: Secondary | ICD-10-CM | POA: Diagnosis present

## 2015-08-21 DIAGNOSIS — Z9221 Personal history of antineoplastic chemotherapy: Secondary | ICD-10-CM

## 2015-08-21 DIAGNOSIS — D6959 Other secondary thrombocytopenia: Secondary | ICD-10-CM | POA: Diagnosis present

## 2015-08-21 DIAGNOSIS — Z923 Personal history of irradiation: Secondary | ICD-10-CM | POA: Diagnosis not present

## 2015-08-21 DIAGNOSIS — M898X5 Other specified disorders of bone, thigh: Secondary | ICD-10-CM | POA: Diagnosis present

## 2015-08-21 DIAGNOSIS — R2681 Unsteadiness on feet: Secondary | ICD-10-CM | POA: Diagnosis not present

## 2015-08-21 DIAGNOSIS — F329 Major depressive disorder, single episode, unspecified: Secondary | ICD-10-CM | POA: Diagnosis not present

## 2015-08-21 DIAGNOSIS — S7291XA Unspecified fracture of right femur, initial encounter for closed fracture: Secondary | ICD-10-CM

## 2015-08-21 DIAGNOSIS — S72009A Fracture of unspecified part of neck of unspecified femur, initial encounter for closed fracture: Secondary | ICD-10-CM

## 2015-08-21 DIAGNOSIS — M84551A Pathological fracture in neoplastic disease, right femur, initial encounter for fracture: Secondary | ICD-10-CM | POA: Diagnosis not present

## 2015-08-21 DIAGNOSIS — K5900 Constipation, unspecified: Secondary | ICD-10-CM | POA: Diagnosis not present

## 2015-08-21 DIAGNOSIS — C9000 Multiple myeloma not having achieved remission: Secondary | ICD-10-CM | POA: Diagnosis present

## 2015-08-21 DIAGNOSIS — G893 Neoplasm related pain (acute) (chronic): Secondary | ICD-10-CM | POA: Diagnosis present

## 2015-08-21 DIAGNOSIS — I10 Essential (primary) hypertension: Secondary | ICD-10-CM | POA: Diagnosis not present

## 2015-08-21 DIAGNOSIS — S72001D Fracture of unspecified part of neck of right femur, subsequent encounter for closed fracture with routine healing: Secondary | ICD-10-CM | POA: Diagnosis not present

## 2015-08-21 DIAGNOSIS — F17210 Nicotine dependence, cigarettes, uncomplicated: Secondary | ICD-10-CM | POA: Diagnosis present

## 2015-08-21 DIAGNOSIS — E559 Vitamin D deficiency, unspecified: Secondary | ICD-10-CM | POA: Diagnosis present

## 2015-08-21 DIAGNOSIS — Z720 Tobacco use: Secondary | ICD-10-CM

## 2015-08-21 DIAGNOSIS — M84451A Pathological fracture, right femur, initial encounter for fracture: Secondary | ICD-10-CM | POA: Diagnosis not present

## 2015-08-21 DIAGNOSIS — B182 Chronic viral hepatitis C: Secondary | ICD-10-CM | POA: Diagnosis present

## 2015-08-21 DIAGNOSIS — Z9481 Bone marrow transplant status: Secondary | ICD-10-CM | POA: Diagnosis not present

## 2015-08-21 DIAGNOSIS — Z79899 Other long term (current) drug therapy: Secondary | ICD-10-CM | POA: Diagnosis not present

## 2015-08-21 DIAGNOSIS — E785 Hyperlipidemia, unspecified: Secondary | ICD-10-CM | POA: Diagnosis present

## 2015-08-21 DIAGNOSIS — Z7982 Long term (current) use of aspirin: Secondary | ICD-10-CM | POA: Diagnosis not present

## 2015-08-21 DIAGNOSIS — M545 Low back pain: Secondary | ICD-10-CM | POA: Diagnosis not present

## 2015-08-21 DIAGNOSIS — R52 Pain, unspecified: Secondary | ICD-10-CM | POA: Diagnosis not present

## 2015-08-21 DIAGNOSIS — M79604 Pain in right leg: Secondary | ICD-10-CM | POA: Diagnosis not present

## 2015-08-21 DIAGNOSIS — M899 Disorder of bone, unspecified: Secondary | ICD-10-CM | POA: Diagnosis present

## 2015-08-21 DIAGNOSIS — M6281 Muscle weakness (generalized): Secondary | ICD-10-CM | POA: Diagnosis not present

## 2015-08-21 DIAGNOSIS — S72002A Fracture of unspecified part of neck of left femur, initial encounter for closed fracture: Secondary | ICD-10-CM | POA: Diagnosis not present

## 2015-08-21 DIAGNOSIS — M84459D Pathological fracture, hip, unspecified, subsequent encounter for fracture with routine healing: Secondary | ICD-10-CM | POA: Diagnosis not present

## 2015-08-21 DIAGNOSIS — S7221XA Displaced subtrochanteric fracture of right femur, initial encounter for closed fracture: Secondary | ICD-10-CM | POA: Diagnosis not present

## 2015-08-21 DIAGNOSIS — R252 Cramp and spasm: Secondary | ICD-10-CM

## 2015-08-21 DIAGNOSIS — M1990 Unspecified osteoarthritis, unspecified site: Secondary | ICD-10-CM | POA: Diagnosis not present

## 2015-08-21 DIAGNOSIS — R030 Elevated blood-pressure reading, without diagnosis of hypertension: Secondary | ICD-10-CM | POA: Diagnosis not present

## 2015-08-21 DIAGNOSIS — K589 Irritable bowel syndrome without diarrhea: Secondary | ICD-10-CM | POA: Diagnosis not present

## 2015-08-21 LAB — BASIC METABOLIC PANEL
ANION GAP: 8 (ref 5–15)
BUN: 14 mg/dL (ref 6–20)
CALCIUM: 9.2 mg/dL (ref 8.9–10.3)
CHLORIDE: 107 mmol/L (ref 101–111)
CO2: 25 mmol/L (ref 22–32)
Creatinine, Ser: 0.69 mg/dL (ref 0.44–1.00)
GFR calc non Af Amer: >60 mL/min (ref >60)
Glucose, Bld: 120 mg/dL — ABNORMAL HIGH (ref 65–99)
POTASSIUM: 3.9 mmol/L (ref 3.5–5.1)
Sodium: 140 mmol/L (ref 135–145)

## 2015-08-21 LAB — CBC WITH DIFFERENTIAL/PLATELET
BASOS PCT: 0 %
Basophils Absolute: 0 10*3/uL (ref 0.0–0.1)
EOS ABS: 0.1 10*3/uL (ref 0.0–0.7)
EOS PCT: 2 %
HCT: 39.1 % (ref 36.0–46.0)
HEMOGLOBIN: 13.2 g/dL (ref 12.0–15.0)
Lymphocytes Relative: 13 %
Lymphs Abs: 0.7 10*3/uL (ref 0.7–4.0)
MCH: 31.7 pg (ref 26.0–34.0)
MCHC: 33.8 g/dL (ref 30.0–36.0)
MCV: 93.8 fL (ref 78.0–100.0)
MONOS PCT: 16 %
Monocytes Absolute: 1 10*3/uL (ref 0.1–1.0)
NEUTROS ABS: 4 10*3/uL (ref 1.7–7.7)
NEUTROS PCT: 69 %
PLATELETS: 146 10*3/uL — AB (ref 150–400)
RBC: 4.17 MIL/uL (ref 3.87–5.11)
RDW: 17 % — AB (ref 11.5–15.5)
WBC: 5.9 10*3/uL (ref 4.0–10.5)

## 2015-08-21 MED ORDER — ALUM & MAG HYDROXIDE-SIMETH 200-200-20 MG/5ML PO SUSP: 30.0000 mL | Freq: Four times a day (QID) | ORAL | Status: DC | PRN

## 2015-08-21 MED ORDER — MAGIC MOUTHWASH
10.0000 mL | Freq: Four times a day (QID) | ORAL | Status: DC | PRN
  Filled 2015-08-21: qty 10

## 2015-08-21 MED ORDER — SODIUM CHLORIDE 0.9 % IJ SOLN
3.0000 mL | Freq: Two times a day (BID) | INTRAMUSCULAR | Status: DC
  Administered 2015-08-21 – 2015-08-26 (×3): 3 mL via INTRAVENOUS

## 2015-08-21 MED ORDER — ACETAMINOPHEN 650 MG RE SUPP: 650.0000 mg | Freq: Four times a day (QID) | RECTAL | Status: DC | PRN

## 2015-08-21 MED ORDER — LORAZEPAM 1 MG PO TABS
1.0000 mg | ORAL_TABLET | Freq: Once | ORAL | Status: AC
  Administered 2015-08-21: 1 mg via ORAL
  Filled 2015-08-21: qty 1

## 2015-08-21 MED ORDER — MORPHINE SULFATE ER 30 MG PO TBCR
60.0000 mg | EXTENDED_RELEASE_TABLET | Freq: Two times a day (BID) | ORAL | Status: DC
  Administered 2015-08-21: 60 mg via ORAL
  Filled 2015-08-21: qty 4

## 2015-08-21 MED ORDER — IPRATROPIUM BROMIDE HFA 17 MCG/ACT IN AERS: 2.0000 | INHALATION_SPRAY | RESPIRATORY_TRACT | Status: DC | PRN

## 2015-08-21 MED ORDER — SODIUM CHLORIDE 0.9 % IV SOLN: 250.0000 mL | INTRAVENOUS | Status: DC | PRN

## 2015-08-21 MED ORDER — IPRATROPIUM BROMIDE 0.02 % IN SOLN: 0.5000 mg | RESPIRATORY_TRACT | Status: DC | PRN

## 2015-08-21 MED ORDER — VITAMIN D 1000 UNITS PO TABS
1000.0000 [IU] | ORAL_TABLET | Freq: Every day | ORAL | Status: DC
  Administered 2015-08-21 – 2015-08-26 (×5): 1000 [IU] via ORAL
  Filled 2015-08-21 (×5): qty 1

## 2015-08-21 MED ORDER — SODIUM CHLORIDE 0.9 % IJ SOLN: 3.0000 mL | INTRAMUSCULAR | Status: DC | PRN

## 2015-08-21 MED ORDER — MORPHINE SULFATE ER 60 MG PO TBCR
60.0000 mg | EXTENDED_RELEASE_TABLET | Freq: Three times a day (TID) | ORAL | Status: DC
  Administered 2015-08-21 – 2015-08-24 (×7): 60 mg via ORAL
  Filled 2015-08-21 (×3): qty 1
  Filled 2015-08-21: qty 2
  Filled 2015-08-21 (×2): qty 1
  Filled 2015-08-21: qty 2

## 2015-08-21 MED ORDER — HYDROMORPHONE HCL 2 MG/ML IJ SOLN
2.0000 mg | Freq: Once | INTRAMUSCULAR | Status: AC
  Administered 2015-08-21: 2 mg via INTRAVENOUS
  Filled 2015-08-21: qty 1

## 2015-08-21 MED ORDER — MORPHINE SULFATE 15 MG PO TABS
60.0000 mg | ORAL_TABLET | ORAL | Status: DC | PRN
  Administered 2015-08-21 – 2015-08-23 (×5): 60 mg via ORAL
  Filled 2015-08-21 (×2): qty 4
  Filled 2015-08-21 (×2): qty 2
  Filled 2015-08-21: qty 4
  Filled 2015-08-21: qty 2

## 2015-08-21 MED ORDER — HYDROMORPHONE HCL 1 MG/ML IJ SOLN
1.0000 mg | INTRAMUSCULAR | Status: DC | PRN
  Administered 2015-08-21: 2 mg via INTRAVENOUS
  Filled 2015-08-21: qty 2

## 2015-08-21 MED ORDER — LEVOTHYROXINE SODIUM 50 MCG PO TABS
50.0000 ug | ORAL_TABLET | Freq: Every day | ORAL | Status: DC
  Administered 2015-08-23 – 2015-08-26 (×4): 50 ug via ORAL
  Filled 2015-08-21 (×5): qty 1

## 2015-08-21 MED ORDER — ZOLPIDEM TARTRATE 5 MG PO TABS
5.0000 mg | ORAL_TABLET | Freq: Every evening | ORAL | Status: DC | PRN
  Administered 2015-08-23 – 2015-08-25 (×3): 5 mg via ORAL
  Filled 2015-08-21 (×3): qty 1

## 2015-08-21 MED ORDER — SENNA 8.6 MG PO TABS
1.0000 | ORAL_TABLET | Freq: Two times a day (BID) | ORAL | Status: DC
  Administered 2015-08-21 – 2015-08-25 (×7): 8.6 mg via ORAL
  Filled 2015-08-21 (×7): qty 1

## 2015-08-21 MED ORDER — HYDROMORPHONE HCL 2 MG/ML IJ SOLN
2.0000 mg | Freq: Once | INTRAMUSCULAR | Status: AC
Start: 2015-08-21 — End: 2015-08-21
  Administered 2015-08-21: 2 mg via INTRAVENOUS
  Filled 2015-08-21: qty 1

## 2015-08-21 MED ORDER — MAGNESIUM HYDROXIDE 400 MG/5ML PO SUSP
30.0000 mL | Freq: Every day | ORAL | Status: DC | PRN
  Administered 2015-08-25: 30 mL via ORAL
  Filled 2015-08-21: qty 30

## 2015-08-21 MED ORDER — LOSARTAN POTASSIUM 50 MG PO TABS
50.0000 mg | ORAL_TABLET | Freq: Every day | ORAL | Status: DC
  Administered 2015-08-22 – 2015-08-23 (×2): 50 mg via ORAL
  Filled 2015-08-21 (×2): qty 1

## 2015-08-21 MED ORDER — CYCLOBENZAPRINE HCL 10 MG PO TABS
10.0000 mg | ORAL_TABLET | Freq: Three times a day (TID) | ORAL | Status: DC | PRN
  Administered 2015-08-22 – 2015-08-23 (×2): 10 mg via ORAL
  Filled 2015-08-21 (×3): qty 1

## 2015-08-21 MED ORDER — ASPIRIN 81 MG PO CHEW
81.0000 mg | CHEWABLE_TABLET | Freq: Every day | ORAL | Status: DC
  Administered 2015-08-23 – 2015-08-26 (×4): 81 mg via ORAL
  Filled 2015-08-21 (×4): qty 1

## 2015-08-21 MED ORDER — DEXAMETHASONE SODIUM PHOSPHATE 4 MG/ML IJ SOLN
4.0000 mg | Freq: Four times a day (QID) | INTRAMUSCULAR | Status: DC
Start: 2015-08-21 — End: 2015-08-25
  Administered 2015-08-22 – 2015-08-25 (×13): 4 mg via INTRAVENOUS
  Filled 2015-08-21 (×13): qty 1

## 2015-08-21 MED ORDER — ONDANSETRON HCL 4 MG PO TABS: 4.0000 mg | ORAL_TABLET | Freq: Four times a day (QID) | ORAL | Status: DC | PRN

## 2015-08-21 MED ORDER — ACETAMINOPHEN 325 MG PO TABS
650.0000 mg | ORAL_TABLET | Freq: Four times a day (QID) | ORAL | Status: DC | PRN
  Filled 2015-08-21: qty 2

## 2015-08-21 MED ORDER — ENOXAPARIN SODIUM 40 MG/0.4ML ~~LOC~~ SOLN
40.0000 mg | SUBCUTANEOUS | Status: DC
  Administered 2015-08-21: 40 mg via SUBCUTANEOUS
  Filled 2015-08-21: qty 0.4

## 2015-08-21 MED ORDER — DEXAMETHASONE SODIUM PHOSPHATE 10 MG/ML IJ SOLN
4.0000 mg | Freq: Once | INTRAMUSCULAR | Status: AC
Start: 2015-08-21 — End: 2015-08-21
  Administered 2015-08-21: 4 mg via INTRAVENOUS
  Filled 2015-08-21: qty 1

## 2015-08-21 MED ORDER — ONDANSETRON HCL 4 MG/2ML IJ SOLN: 4.0000 mg | Freq: Four times a day (QID) | INTRAMUSCULAR | Status: DC | PRN

## 2015-08-21 MED ORDER — SUCRALFATE 1 G PO TABS: 1.0000 g | ORAL_TABLET | Freq: Four times a day (QID) | ORAL | Status: DC | PRN

## 2015-08-21 NOTE — H&P (Signed)
History and Physical:    Kendra Johns   TGG:[id number] DOB: 12-05-1950 DOA: 08/21/2015  Referring MD/provider: Charlesetta Shanks, MD PCP: [name], MD  Oncologist: Dr. Alvy Bimler  Chief Complaint: Right thigh pain  History of Present Illness:   Kendra Johns is an 64 y.o. 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 Alvy Bimler, who presents with right thigh pain.  The patient states that she has had gradually worsening right thigh pain for the past 1 month, with no relief from her usual pain medications.  The pain has progressed to the point where she has had trouble ambulating over the past week, and was unable to bear weight on it this morning.  Plain films done in the ED show a lytic lesion of the right femur. The patient's oncologist was notified and she recommended inpatient management of pain with initiation of IV steroids and consultation with radiation oncology.  ROS:   Review of Systems  Constitutional: Negative for fever, chills, weight loss and malaise/fatigue.  HENT: Negative.   Respiratory: Positive for cough. Negative for sputum production and shortness of breath.   Cardiovascular: Positive for leg swelling. Negative for chest pain and palpitations.  Gastrointestinal: Positive for constipation. Negative for heartburn, nausea, vomiting, abdominal pain, diarrhea, blood in stool and melena.  Genitourinary: Negative.   Musculoskeletal: Positive for myalgias and back pain. Negative for falls.       Right femur pain  Skin: Negative.   Neurological:  Positive for tingling. Negative for weakness.       RLE  Endo/Heme/Allergies: Bruises/bleeds easily.  Psychiatric/Behavioral: Negative for depression. The patient is not nervous/anxious.     Past Medical History:   Past Medical History  Diagnosis Date  . Depression   . Hypertension   . Hepatitis C     genotype 1  . Diverticulosis of colon   . Multiple myeloma     kappa light chain, s/p high-dose chemo/stem cell tx - [facility]  . Hx of adenomatous colonic polyps 2007  . Anxiety   . Osteoarthritis   . Sleep apnea   . Hyperlipidemia   . GERD with stricture   . External hemorrhoids   . Hepatitis B virus infection   . IBS (irritable bowel syndrome)   . Helicobacter pylori gastritis 2001    ? if treated  . Sciatic nerve pain     with Dr. Earle Gell  . Chronic low back pain   . Unspecified vitamin D deficiency 09/22/2013  . Allergy   . Fever 11/28/2013  . Dehydration 11/28/2013  . Nausea alone 01/22/2014  . Cough 08/02/2014  . DIVERTICULOSIS, COLON 08/31/2006  . DEPRESSION 08/31/2006    Qualifier: Diagnosis of  By: [name], [name]    . Muscle cramp 11/21/2014  . Chronic fatigue 03/07/2015  . Vitamin D deficiency 05/28/2015  . S/P radiation therapy 06/05/15-06/18/15    C7-T1     Past Surgical History:   Past Surgical History  Procedure Laterality Date  . Abdominal hysterectomy    . Oophorectomy    . Dilation and curettage of uterus    . Bone marrow transplant  2009, 2013 [facility]  . Appendectomy    . Colonoscopy w/ polypectomy  08/2006    1-2 adenomas, 6 polyps total, diverticulosis and external hemorrhoids  . Upper gastrointestinal endoscopy  08/2003    esophageal stricture dilation, hiatal hernia, gastrritis  . Cholecystectomy    . Tonsillectomy and adenoidectomy      Social History:   Social History   Social History  . Marital Status: Married    Spouse Name: Dede Dobesh.  . Number of Children: 2  . Years of Education: N/A   Occupational History  . Security      Social History Main Topics  . Smoking status: Current Every Day Smoker -- 0.25 packs/day for 44 years    Types: Cigarettes  . Smokeless tobacco: Never Used  . Alcohol Use: No  . Drug Use: No  . Sexual Activity: No   Other Topics Concern  . Not on file   Social History Narrative   Lives with husband and grandson.  Ambulates with a walker at baseline.    Family history:   Family History  Problem Relation Age of Onset  . Alcohol abuse    . Lung cancer Father     smoked  . Leukemia Mother   . Cirrhosis Sister   . Colon cancer Neg Hx   . Lung cancer Sister     smoked    Allergies   Codeine; Ibuprofen; and Albuterol  Current Medications:   Prior to Admission medications   Medication Sig Start Date End Date Taking? Authorizing Provider  aspirin 81 MG tablet Take 81 mg by mouth daily.   Yes Historical Provider, MD  Cholecalciferol (VITAMIN D3) 5000 UNITS TABS Take 5,000 mg by mouth every morning. 12/05/14  Yes Heath Lark, MD  cyclobenzaprine (FLEXERIL) 10 MG tablet Take 1 tablet (10 mg total) by mouth 3 (three) times daily as needed for muscle spasms. 07/10/15  Yes Heath Lark, MD  dexamethasone (DECADRON) 4 MG tablet TAKE 1 TABLET (4 MG TOTAL) BY MOUTH DAILY. 08/14/15  Yes Heath Lark, MD  ergocalciferol (VITAMIN D2) 50000 UNITS capsule Take 1 capsule (50,000 Units total) by mouth once a week. 05/28/15  Yes Heath Lark, MD  ipratropium (ATROVENT HFA) 17 MCG/ACT inhaler Inhale 2 puffs into the lungs every 4 (four) hours as needed for wheezing. 09/13/14  Yes Heath Lark, MD  lenalidomide (REVLIMID) 25 MG capsule TAKE ONE CAPSULE BY MOUTH EVERY DAY FOR 21 DAYS THEN 7 DAYS OFF. 08/01/15  Yes Heath Lark, MD  levothyroxine (SYNTHROID) 50 MCG tablet Take 1 tablet (50 mcg total) by mouth daily before breakfast. 04/17/15  Yes Heath Lark, MD  lidocaine-prilocaine (EMLA) cream Apply 1 application topically as needed. Patient taking differently: Apply 1 application topically daily as needed (for  port).  05/28/15  Yes Heath Lark, MD  losartan (COZAAR) 50 MG tablet TAKE 1/2 TO 1 TABLET DAILY Patient taking differently: Take 1 tablet by mouth daily 07/23/15  Yes Marin Olp, MD  magic mouthwash SOLN Take 10 mLs by mouth 4 (four) times daily as needed for mouth pain. Swish and spit  Or  Swish and swallow. 07/10/15  Yes Heath Lark, MD  magnesium hydroxide (MILK OF MAGNESIA) 400 MG/5ML suspension Take 30 mLs by mouth daily as needed for mild constipation (constipation).    Yes Historical Provider, MD  morphine (MS CONTIN) 60 MG 12 hr tablet Take 60 mg by mouth every 8 (eight) hours.  05/25/15  Yes Historical Provider, MD  morphine (MSIR)  30 MG tablet Take 30 mg by mouth every 4 (four) hours as needed for severe pain (pain).    Yes Historical Provider, MD  PRESCRIPTION MEDICATION Chemo at [facility] every other week   Yes Historical Provider, MD  prochlorperazine (COMPAZINE) 10 MG tablet Take 1 tablet (10 mg total) by mouth every 6 (six) hours as needed for nausea. 07/10/15  Yes Heath Lark, MD  sucralfate (CARAFATE) 1 G tablet Take 1 tablet (1 g total) by mouth 4 (four) times daily. Patient taking differently: Take 1 g by mouth 4 (four) times daily as needed (stomach ulcers/ pain).  06/17/15  Yes Kyung Rudd, MD    Physical Exam:   Filed Vitals:   08/21/15 1337 08/21/15 1451 08/21/15 1544 08/21/15 1636  BP: 153/74 151/90 104/67 142/72  Pulse: 71 61 68 57  Temp:      TempSrc:      Resp: _0 SpO2: 98% 99% 100% 96%     Physical Exam: Blood pressure 142/72, pulse 57, temperature 98.5 F (36.9 C), temperature source Oral, resp. rate 18, SpO2 96 %. Gen: Moderate distress secondary to leg pain. Head: Normocephalic, atraumatic. Eyes: PERRL, EOMI, sclerae nonicteric. Mouth: Oropharynx clear with poor dentition. Neck: Supple, no thyromegaly, no lymphadenopathy, no jugular venous distention. Chest: Lungs clear to auscultation bilaterally. CV: Heart sounds are regular. No murmurs, rubs, or  gallops. Abdomen: Soft, nontender, nondistended with normal active bowel sounds. Extremities: Extremities are without clubbing, edema, or cyanosis. Skin: Warm and dry. Neuro: Alert and oriented times 3; cranial nerves II through XII grossly intact. Musculoskeletal: Decreased range of motion right lower extremity secondary to guarding/pain. Psych: Mood and affect normal.   Data Review:    Labs: Basic Metabolic Panel: No results for input(s): NA, K, CL, CO2, GLUCOSE, BUN, CREATININE, CALCIUM, MG, PHOS in the last 168 hours. Liver Function Tests: No results for input(s): AST, ALT, ALKPHOS, BILITOT, PROT, ALBUMIN in the last 168 hours. No results for input(s): LIPASE, AMYLASE in the last 168 hours. No results for input(s): AMMONIA in the last 168 hours. CBC:  Recent Labs Lab 08/21/15 1711  WBC 5.9  NEUTROABS 4.0  HGB 13.2  HCT 39.1  MCV 93.8  PLT 146*    Radiographic Studies: Dg Femur 1v Right  08/21/2015  CLINICAL DATA:  Multiple myeloma, known RIGHT femoral tumor, increased pain for 1 day EXAM: RIGHT FEMUR 1 VIEW COMPARISON:  08/14/2015 FINDINGS: Osseous demineralization. Poorly defined lytic lesions are identified at the proximal and proximal/mid RIGHT femoral diaphysis compatible with multiple myeloma. Endosteal cortical thinning at the larger more proximal femoral lytic lesion. A small amount of periosteal reaction is identified adjacent to the more cranial of the proximal femoral lesions, located in the subtrochanteric region. Additionally, subtle linear cortical lucency is seen in the medial cortex of the proximal RIGHT femur inferior to the lesser trochanter likely representing a developing pathologic fracture. Tiny rounded lytic lesion of the distal RIGHT femoral metaphysis is noted. Knee and hip joint alignments normal. No additional fracture, dislocation or bone destruction. IMPRESSION: Lytic lesions within the RIGHT femoral diaphysis compatible with multiple myeloma.  Periosteal reaction and subtle cortical linear lucency at thinned medial cortex of the proximal RIGHT femoral diaphysis inferior to the lesser trochanter likely representing a developing pathologic fracture. Electronically Signed   By: Lavonia Dana M.D.   On: 08/21/2015 16:45   *I have personally reviewed the images above*  EKG: None ordered.   Assessment/Plan:   Principal Problem:  Lytic bone lesion of right femur with intractable cancer associated pain - Admit for pain control. Continue MS Contin 60 mg every 8 hours, MSIR 60 mg every 4 hours when necessary. - Add Dilaudid 1-2 milligrams IV every 2 hours when necessary. - Palliative care consultation for assistance with symptom control. - Start Decadron 4 mg IV every 6 hours. - Oncologist aware of admission. Consult radiation oncology in the morning for palliative radiation treatment.  Active Problems:   Essential hypertension - Continue Cozaar.    Hypothyroidism - Continue Synthroid.    Vitamin D deficiency - Continue vitamin D supplementation.    Multiple myeloma not having achieved remission (HCC)/  Status post bone marrow transplant ([place]) - Just completed her cycle of Revlimid. - Oncologist aware of admission.    Thrombocytopenia due to drugs - Platelet count mildly reduced. Monitor.    DVT prophylaxis - Lovenox ordered.  Code Status: Full. Family Communication: Daughter at the bedside. Disposition Plan: Home when pain controlled, likely several days.  Time spent: One hour.  Sharin Altidor [facility] Pager [phone] Cell: [phone]   If 7PM-7AM, please contact night-coverage [email or web address] Password [email or web address] 08/21/2015, 5:44 PM

## 2015-08-21 NOTE — ED Notes (Signed)
Bed: WA07 Expected date:  Expected time:  Means of arrival:  Comments: Ems- right leg pain, hx of tumor in right leg x9 years, increased pain

## 2015-08-21 NOTE — Telephone Encounter (Signed)
Patient called in stating that she is having excruciating right leg pain. Patient can not stand on her right leg and is contemplating on calling the ambulance. Patient is scheduled for treatment today but is afraid that she will not be able to make it. Message sent to RN Tammi/MD Alvy Bimler.

## 2015-08-21 NOTE — Telephone Encounter (Signed)
I would recommend ED visit to exclude new fracture

## 2015-08-21 NOTE — Telephone Encounter (Signed)
Patient informed and verbalized understanding

## 2015-08-21 NOTE — ED Notes (Signed)
Patient transported to X-ray 

## 2015-08-21 NOTE — ED Notes (Addendum)
Called cancer center to inform them that pt won't be able to make it to her 1215 appt.  Spoke to [employer]

## 2015-08-21 NOTE — ED Provider Notes (Signed)
CSN: [id number]     Arrival date & time 08/21/15  1106 History   First MD Initiated Contact with Patient 08/21/15 1221     Chief Complaint  Patient presents with  . Leg Pain     (Consider location/radiation/quality/duration/timing/severity/associated sxs/prior Treatment) HPI Patient reports she's getting severe pain in her right thigh. She has known multiple myeloma. She reports she has problems with pain in her right back and leg. It has been progressively worsening over a couple weeks. She states over the past day or 2 however it does become intolerable. She has had increase her morphine. She reports previously she would take her extended release in the morning and not need to take very much of the immediate release. She reports now she's had to take her immediate release every 4 hours and sometimes 2 tablets at a time. Today she reports any movements are excruciating the painful for her. The pain is localizing into her mid thigh. She does not have edema or swelling in the lower leg and no calf pain. She does not have other acute complaints of fever chills or abdominal pain. Past Medical History  Diagnosis Date  . Depression   . Hypertension   . Hepatitis C     genotype 1  . Diverticulosis of colon   . Multiple myeloma     kappa light chain, s/p high-dose chemo/stem cell tx - [facility]  . Hx of adenomatous colonic polyps 2007  . Anxiety   . Osteoarthritis   . Sleep apnea   . Hyperlipidemia   . GERD with stricture   . External hemorrhoids   . Hepatitis B virus infection   . IBS (irritable bowel syndrome)   . Helicobacter pylori gastritis 2001    ? if treated  . Sciatic nerve pain     with Dr. Earle Gell  . Chronic low back pain   . Unspecified vitamin D deficiency 09/22/2013  . Allergy   . Fever 11/28/2013  . Dehydration 11/28/2013  . Nausea alone 01/22/2014  . Cough 08/02/2014  . DIVERTICULOSIS, COLON 08/31/2006  . DEPRESSION 08/31/2006    Qualifier: Diagnosis of  By: Leanne Chang  MD, [name]    . Muscle cramp 11/21/2014  . Chronic fatigue 03/07/2015  . Vitamin D deficiency 05/28/2015  . S/P radiation therapy 06/05/15-06/18/15    C7-T1    Past Surgical History  Procedure Laterality Date  . Abdominal hysterectomy    . Oophorectomy    . Dilation and curettage of uterus    . Bone marrow transplant      2009, 2013 [facility]  . Appendectomy    . Colonoscopy w/ polypectomy  08/2006    1-2 adenomas, 6 polyps total, diverticulosis and external hemorrhoids  . Upper gastrointestinal endoscopy  08/2003    esophageal stricture dilation, hiatal hernia, gastrritis  . Cholecystectomy    . Tonsillectomy and adenoidectomy     Family History  Problem Relation Age of Onset  . Alcohol abuse    . Lung cancer Father     smoked  . Leukemia Mother   . Cirrhosis Sister   . Colon cancer Neg Hx   . Lung cancer Sister     smoked   Social History  Substance Use Topics  . Smoking status: Current Every Day Smoker -- 0.25 packs/day for 44 years    Types: Cigarettes  . Smokeless tobacco: Never Used  . Alcohol Use: No   OB History    No data available  Review of Systems 10 Systems reviewed and are negative for acute change except as noted in the HPI.    Allergies  Codeine; Ibuprofen; and Albuterol  Home Medications   Prior to Admission medications   Medication Sig Start Date End Date Taking? Authorizing Provider  aspirin 81 MG tablet Take 81 mg by mouth daily.   Yes Historical Provider, MD  Cholecalciferol (VITAMIN D3) 5000 UNITS TABS Take 5,000 mg by mouth every morning. 12/05/14  Yes Heath Lark, MD  cyclobenzaprine (FLEXERIL) 10 MG tablet Take 1 tablet (10 mg total) by mouth 3 (three) times daily as needed for muscle spasms. 07/10/15  Yes Heath Lark, MD  dexamethasone (DECADRON) 4 MG tablet TAKE 1 TABLET (4 MG TOTAL) BY MOUTH DAILY. 08/14/15  Yes Heath Lark, MD  ergocalciferol (VITAMIN D2) 50000 UNITS capsule Take 1 capsule (50,000 Units total) by mouth once a week. 05/28/15  Yes  Heath Lark, MD  ipratropium (ATROVENT HFA) 17 MCG/ACT inhaler Inhale 2 puffs into the lungs every 4 (four) hours as needed for wheezing. 09/13/14  Yes Heath Lark, MD  lenalidomide (REVLIMID) 25 MG capsule TAKE ONE CAPSULE BY MOUTH EVERY DAY FOR 21 DAYS THEN 7 DAYS OFF. 08/01/15  Yes Heath Lark, MD  levothyroxine (SYNTHROID) 50 MCG tablet Take 1 tablet (50 mcg total) by mouth daily before breakfast. 04/17/15  Yes Heath Lark, MD  lidocaine-prilocaine (EMLA) cream Apply 1 application topically as needed. Patient taking differently: Apply 1 application topically daily as needed (for port).  05/28/15  Yes Heath Lark, MD  losartan (COZAAR) 50 MG tablet TAKE 1/2 TO 1 TABLET DAILY Patient taking differently: Take 1 tablet by mouth daily 07/23/15  Yes Marin Olp, MD  magic mouthwash SOLN Take 10 mLs by mouth 4 (four) times daily as needed for mouth pain. Swish and spit  Or  Swish and swallow. 07/10/15  Yes Heath Lark, MD  magnesium hydroxide (MILK OF MAGNESIA) 400 MG/5ML suspension Take 30 mLs by mouth daily as needed for mild constipation (constipation).    Yes Historical Provider, MD  morphine (MS CONTIN) 60 MG 12 hr tablet Take 60 mg by mouth every 8 (eight) hours.  05/25/15  Yes Historical Provider, MD  morphine (MSIR) 30 MG tablet Take 30 mg by mouth every 4 (four) hours as needed for severe pain (pain).    Yes Historical Provider, MD  PRESCRIPTION MEDICATION Chemo at [facility] every other week   Yes Historical Provider, MD  prochlorperazine (COMPAZINE) 10 MG tablet Take 1 tablet (10 mg total) by mouth every 6 (six) hours as needed for nausea. 07/10/15  Yes Heath Lark, MD  sucralfate (CARAFATE) 1 G tablet Take 1 tablet (1 g total) by mouth 4 (four) times daily. Patient taking differently: Take 1 g by mouth 4 (four) times daily as needed (stomach ulcers/ pain).  06/17/15  Yes Kyung Rudd, MD   BP 142/72 mmHg  Pulse 57  Temp(Src) 98.5 F (36.9 C) (Oral)  Resp 18  SpO2 96% Physical Exam  Constitutional: She  appears well-developed and well-nourished.  Patient is very well in appearance aside from the amount of pain she gets with any movement of her leg. She has no respiratory distress or mental status clear.  HENT:  Head: Normocephalic and atraumatic.  Mouth/Throat: Oropharynx is clear and moist.  Eyes: EOM are normal. Pupils are equal, round, and reactive to light.  Cardiovascular: Normal rate, regular rhythm, normal heart sounds and intact distal pulses.   Pulmonary/Chest: Effort normal and breath sounds  normal.  Abdominal: Soft. Bowel sounds are normal. She exhibits no distension. There is no tenderness.  Musculoskeletal:  Patient has excruciating pain with any attempted flexion or any type of movement at the right hip. She reports the pain is being experienced in her upper and mid thigh. There is no apparent edema. I can't appreciate effusion at the knee or the ankle. The calves are soft and nontender. Feet are warm and dry.    ED Course  Procedures (including critical care time) Labs Review Labs Reviewed  BASIC METABOLIC PANEL  CBC WITH DIFFERENTIAL/PLATELET    Imaging Review Dg Femur 1v Right  08/21/2015  CLINICAL DATA:  Multiple myeloma, known RIGHT femoral tumor, increased pain for 1 day EXAM: RIGHT FEMUR 1 VIEW COMPARISON:  08/14/2015 FINDINGS: Osseous demineralization. Poorly defined lytic lesions are identified at the proximal and proximal/mid RIGHT femoral diaphysis compatible with multiple myeloma. Endosteal cortical thinning at the larger more proximal femoral lytic lesion. A small amount of periosteal reaction is identified adjacent to the more cranial of the proximal femoral lesions, located in the subtrochanteric region. Additionally, subtle linear cortical lucency is seen in the medial cortex of the proximal RIGHT femur inferior to the lesser trochanter likely representing a developing pathologic fracture. Tiny rounded lytic lesion of the distal RIGHT femoral metaphysis is  noted. Knee and hip joint alignments normal. No additional fracture, dislocation or bone destruction. IMPRESSION: Lytic lesions within the RIGHT femoral diaphysis compatible with multiple myeloma. Periosteal reaction and subtle cortical linear lucency at thinned medial cortex of the proximal RIGHT femoral diaphysis inferior to the lesser trochanter likely representing a developing pathologic fracture. Electronically Signed   By: Lavonia Dana M.D.   On: 08/21/2015 16:45   I have personally reviewed and evaluated these images and lab results as part of my medical decision-making.   EKG Interpretation None     Consult: Patient's case was reviewed with Dr. Simeon Craft such she advises for dexamethasone 4 mg every 6 hours in addition to the patient's needed narcotic pain control. She also suggests attempted consultation with Dr. Lisbeth Renshaw with radiation therapy to see if the patient can get inpatient treatment.  Consult: Patient's case was reviewed with hospitalist for admission.  At this time awaiting call back from radiation oncology. MDM   Final diagnoses:  Intractable pain  Multiple myeloma not having achieved remission [facility])   Patient presents as outlined with severe pain. She has known multiple myeloma. She is nontoxic in appearance. The pain is concentrated into the area of her known disease. She does not have lower extremity edema or calf tenderness or soft tissue changes to suggest the problem is other than pain exacerbation. She'll be admitted for pain control and further treatment as indicated.    Charlesetta Shanks, MD 08/21/15 [id number]

## 2015-08-22 ENCOUNTER — Inpatient Hospital Stay (HOSPITAL_COMMUNITY): Payer: Medicare Other

## 2015-08-22 ENCOUNTER — Encounter (HOSPITAL_COMMUNITY): Payer: Self-pay | Admitting: Anesthesiology

## 2015-08-22 ENCOUNTER — Ambulatory Visit
Admission: RE | Admit: 2015-08-22 | Discharge: 2015-08-22 | Disposition: A | Discharge status: Home / Self Care | Payer: Medicare Other | Source: Ambulatory Visit | Attending: Radiation Oncology | Admitting: Radiation Oncology

## 2015-08-22 ENCOUNTER — Encounter (HOSPITAL_COMMUNITY)
Admission: EM | Disposition: A | Discharge status: Skilled Nursing Facility | Payer: Self-pay | Source: Home / Self Care | Attending: Internal Medicine

## 2015-08-22 ENCOUNTER — Inpatient Hospital Stay (HOSPITAL_COMMUNITY): Payer: Medicare Other | Admitting: Anesthesiology

## 2015-08-22 DIAGNOSIS — Z515 Encounter for palliative care: Secondary | ICD-10-CM | POA: Insufficient documentation

## 2015-08-22 DIAGNOSIS — I10 Essential (primary) hypertension: Secondary | ICD-10-CM

## 2015-08-22 DIAGNOSIS — Z720 Tobacco use: Secondary | ICD-10-CM

## 2015-08-22 DIAGNOSIS — M898X5 Other specified disorders of bone, thigh: Secondary | ICD-10-CM

## 2015-08-22 DIAGNOSIS — C9000 Multiple myeloma not having achieved remission: Secondary | ICD-10-CM

## 2015-08-22 DIAGNOSIS — C9002 Multiple myeloma in relapse: Secondary | ICD-10-CM | POA: Diagnosis not present

## 2015-08-22 DIAGNOSIS — F17210 Nicotine dependence, cigarettes, uncomplicated: Secondary | ICD-10-CM | POA: Diagnosis present

## 2015-08-22 DIAGNOSIS — R52 Pain, unspecified: Secondary | ICD-10-CM

## 2015-08-22 HISTORY — PX: FEMUR IM NAIL: SHX1597

## 2015-08-22 LAB — CBC
HCT: 39.1 % (ref 36.0–46.0)
Hemoglobin: 13 g/dL (ref 12.0–15.0)
MCH: 31.4 pg (ref 26.0–34.0)
MCHC: 33.2 g/dL (ref 30.0–36.0)
MCV: 94.4 fL (ref 78.0–100.0)
Platelets: 136 10*3/uL — ABNORMAL LOW (ref 150–400)
RBC: 4.14 MIL/uL (ref 3.87–5.11)
RDW: 16.9 % — AB (ref 11.5–15.5)
WBC: 6.6 10*3/uL (ref 4.0–10.5)

## 2015-08-22 LAB — SURGICAL PCR SCREEN
MRSA, PCR: NEGATIVE
STAPHYLOCOCCUS AUREUS: NEGATIVE

## 2015-08-22 LAB — CREATININE, SERUM
Creatinine, Ser: 0.76 mg/dL (ref 0.44–1.00)
GFR calc Af Amer: >60 mL/min (ref >60)
GFR calc non Af Amer: >60 mL/min (ref >60)

## 2015-08-22 SURGERY — INSERTION, INTRAMEDULLARY ROD, FEMUR
Anesthesia: General | Site: Leg Upper | Laterality: Right

## 2015-08-22 MED ORDER — HYDROMORPHONE HCL 1 MG/ML IJ SOLN
2.0000 mg | INTRAMUSCULAR | Status: DC | PRN
Start: 2015-08-22 — End: 2015-08-23
  Administered 2015-08-22 – 2015-08-23 (×5): 2 mg via INTRAVENOUS
  Filled 2015-08-22 (×5): qty 2

## 2015-08-22 MED ORDER — CEFAZOLIN SODIUM-DEXTROSE 2-3 GM-% IV SOLR
2.0000 g | Freq: Four times a day (QID) | INTRAVENOUS | Status: AC
Start: 2015-08-22 — End: 2015-08-23
  Administered 2015-08-22 – 2015-08-23 (×2): 2 g via INTRAVENOUS
  Filled 2015-08-22 (×2): qty 50

## 2015-08-22 MED ORDER — ROCURONIUM BROMIDE 50 MG/5ML IV SOLN
INTRAVENOUS | Status: AC
  Filled 2015-08-22: qty 1

## 2015-08-22 MED ORDER — PROPOFOL 10 MG/ML IV BOLUS
INTRAVENOUS | Status: AC
  Filled 2015-08-22: qty 20

## 2015-08-22 MED ORDER — HYDROMORPHONE HCL 1 MG/ML IJ SOLN
0.5000 mg | INTRAMUSCULAR | Status: DC | PRN
  Administered 2015-08-22: 0.5 mg via INTRAVENOUS

## 2015-08-22 MED ORDER — OXYCODONE HCL 5 MG/5ML PO SOLN: 5.0000 mg | Freq: Once | ORAL | Status: AC | PRN

## 2015-08-22 MED ORDER — DEXAMETHASONE SODIUM PHOSPHATE 4 MG/ML IJ SOLN
INTRAMUSCULAR | Status: AC
  Filled 2015-08-22: qty 1

## 2015-08-22 MED ORDER — CEFAZOLIN SODIUM-DEXTROSE 2-3 GM-% IV SOLR
2.0000 g | INTRAVENOUS | Status: DC
  Administered 2015-08-22: 2 g via INTRAVENOUS

## 2015-08-22 MED ORDER — HYDROMORPHONE HCL 2 MG/ML IJ SOLN
2.0000 mg | INTRAMUSCULAR | Status: DC | PRN
  Administered 2015-08-22 (×2): 2 mg via INTRAVENOUS
  Filled 2015-08-22 (×2): qty 1

## 2015-08-22 MED ORDER — 0.9 % SODIUM CHLORIDE (POUR BTL) OPTIME
TOPICAL | Status: DC | PRN
  Administered 2015-08-22: 1000 mL

## 2015-08-22 MED ORDER — LIDOCAINE-PRILOCAINE 2.5-2.5 % EX CREA
TOPICAL_CREAM | Freq: Once | CUTANEOUS | Status: AC
  Administered 2015-08-22: 11:00:00 via TOPICAL
  Filled 2015-08-22: qty 5

## 2015-08-22 MED ORDER — OXYCODONE HCL 5 MG PO TABS
5.0000 mg | ORAL_TABLET | Freq: Once | ORAL | Status: AC | PRN
  Administered 2015-08-22: 5 mg via ORAL

## 2015-08-22 MED ORDER — FENTANYL CITRATE (PF) 250 MCG/5ML IJ SOLN
INTRAMUSCULAR | Status: AC
  Filled 2015-08-22: qty 5

## 2015-08-22 MED ORDER — MIDAZOLAM HCL 2 MG/2ML IJ SOLN
INTRAMUSCULAR | Status: AC
  Filled 2015-08-22: qty 4

## 2015-08-22 MED ORDER — SUGAMMADEX SODIUM 200 MG/2ML IV SOLN
INTRAVENOUS | Status: DC | PRN
  Administered 2015-08-22: 200 mg via INTRAVENOUS

## 2015-08-22 MED ORDER — CHLORHEXIDINE GLUCONATE 4 % EX LIQD
60.0000 mL | Freq: Once | CUTANEOUS | Status: AC
Start: 2015-08-22 — End: 2015-08-22
  Administered 2015-08-22: 4 via TOPICAL
  Filled 2015-08-22 (×2): qty 60

## 2015-08-22 MED ORDER — LIDOCAINE HCL (CARDIAC) 20 MG/ML IV SOLN
INTRAVENOUS | Status: AC
  Filled 2015-08-22: qty 5

## 2015-08-22 MED ORDER — METOCLOPRAMIDE HCL 5 MG/ML IJ SOLN: 5.0000 mg | Freq: Three times a day (TID) | INTRAMUSCULAR | Status: DC | PRN

## 2015-08-22 MED ORDER — ONDANSETRON HCL 4 MG/2ML IJ SOLN
INTRAMUSCULAR | Status: DC | PRN
  Administered 2015-08-22: 4 mg via INTRAVENOUS

## 2015-08-22 MED ORDER — ENOXAPARIN SODIUM 40 MG/0.4ML ~~LOC~~ SOLN
40.0000 mg | SUBCUTANEOUS | Status: DC
  Administered 2015-08-23 – 2015-08-26 (×4): 40 mg via SUBCUTANEOUS
  Filled 2015-08-22 (×4): qty 0.4

## 2015-08-22 MED ORDER — HYDROMORPHONE HCL 1 MG/ML IJ SOLN
0.2500 mg | INTRAMUSCULAR | Status: DC | PRN
  Administered 2015-08-22 (×4): 0.5 mg via INTRAVENOUS

## 2015-08-22 MED ORDER — NICOTINE 7 MG/24HR TD PT24: 7.0000 mg | MEDICATED_PATCH | Freq: Every day | TRANSDERMAL | Status: DC

## 2015-08-22 MED ORDER — LIDOCAINE HCL (CARDIAC) 20 MG/ML IV SOLN
INTRAVENOUS | Status: DC | PRN
  Administered 2015-08-22: 100 mg via INTRAVENOUS

## 2015-08-22 MED ORDER — FENTANYL CITRATE (PF) 100 MCG/2ML IJ SOLN
INTRAMUSCULAR | Status: DC | PRN
  Administered 2015-08-22 (×3): 50 ug via INTRAVENOUS
  Administered 2015-08-22: 100 ug via INTRAVENOUS

## 2015-08-22 MED ORDER — OXYCODONE HCL 5 MG PO TABS
ORAL_TABLET | ORAL | Status: AC
  Filled 2015-08-22: qty 1

## 2015-08-22 MED ORDER — NICOTINE 14 MG/24HR TD PT24
14.0000 mg | MEDICATED_PATCH | Freq: Every day | TRANSDERMAL | Status: DC
  Administered 2015-08-22 – 2015-08-26 (×5): 14 mg via TRANSDERMAL
  Filled 2015-08-22 (×5): qty 1

## 2015-08-22 MED ORDER — HYDROMORPHONE HCL 1 MG/ML IJ SOLN
INTRAMUSCULAR | Status: AC
  Administered 2015-08-22: 0.5 mg via INTRAVENOUS
  Filled 2015-08-22: qty 1

## 2015-08-22 MED ORDER — MIDAZOLAM HCL 5 MG/5ML IJ SOLN
INTRAMUSCULAR | Status: DC | PRN
  Administered 2015-08-22: 2 mg via INTRAVENOUS

## 2015-08-22 MED ORDER — ROCURONIUM BROMIDE 100 MG/10ML IV SOLN
INTRAVENOUS | Status: DC | PRN
  Administered 2015-08-22: 50 mg via INTRAVENOUS

## 2015-08-22 MED ORDER — PROPOFOL 10 MG/ML IV BOLUS
INTRAVENOUS | Status: DC | PRN
  Administered 2015-08-22: 120 mg via INTRAVENOUS
  Administered 2015-08-22: 30 mg via INTRAVENOUS
  Administered 2015-08-22: 50 mg via INTRAVENOUS

## 2015-08-22 MED ORDER — ENOXAPARIN SODIUM 40 MG/0.4ML ~~LOC~~ SOLN: 40.0000 mg | SUBCUTANEOUS | Status: DC

## 2015-08-22 MED ORDER — ACETAMINOPHEN 500 MG PO TABS
1000.0000 mg | ORAL_TABLET | Freq: Once | ORAL | Status: AC
  Administered 2015-08-22: 1000 mg via ORAL
  Filled 2015-08-22: qty 2

## 2015-08-22 MED ORDER — LACTATED RINGERS IV SOLN
INTRAVENOUS | Status: DC
  Administered 2015-08-22 (×2): via INTRAVENOUS

## 2015-08-22 MED ORDER — METOCLOPRAMIDE HCL 5 MG PO TABS: 5.0000 mg | ORAL_TABLET | Freq: Three times a day (TID) | ORAL | Status: DC | PRN

## 2015-08-22 MED ORDER — SUGAMMADEX SODIUM 200 MG/2ML IV SOLN
INTRAVENOUS | Status: AC
  Filled 2015-08-22: qty 2

## 2015-08-22 MED ORDER — MENTHOL 3 MG MT LOZG: 1.0000 | LOZENGE | OROMUCOSAL | Status: DC | PRN

## 2015-08-22 MED ORDER — PROMETHAZINE HCL 25 MG/ML IJ SOLN: 6.2500 mg | INTRAMUSCULAR | Status: DC | PRN

## 2015-08-22 MED ORDER — PHENOL 1.4 % MT LIQD: 1.0000 | OROMUCOSAL | Status: DC | PRN

## 2015-08-22 SURGICAL SUPPLY — 40 items
BIT DRILL AO GAMMA 4.2X180 (BIT) ×2 IMPLANT
CLOSURE STERI-STRIP 1/2X4 (GAUZE/BANDAGES/DRESSINGS) ×1
CLSR STERI-STRIP ANTIMIC 1/2X4 (GAUZE/BANDAGES/DRESSINGS) ×2 IMPLANT
COVER PERINEAL POST (MISCELLANEOUS) ×3 IMPLANT
COVER SURGICAL LIGHT HANDLE (MISCELLANEOUS) ×3 IMPLANT
DRAPE STERI IOBAN 125X83 (DRAPES) ×3 IMPLANT
DRSG MEPILEX BORDER 4X4 (GAUZE/BANDAGES/DRESSINGS) ×8 IMPLANT
DRSG MEPILEX BORDER 4X8 (GAUZE/BANDAGES/DRESSINGS) ×2 IMPLANT
DURAPREP 26ML APPLICATOR (WOUND CARE) ×3 IMPLANT
ELECT REM PT RETURN 9FT ADLT (ELECTROSURGICAL) ×3
ELECTRODE REM PT RTRN 9FT ADLT (ELECTROSURGICAL) ×1 IMPLANT
GLOVE BIO SURGEON STRL SZ7 (GLOVE) ×5 IMPLANT
GLOVE BIO SURGEON STRL SZ7.5 (GLOVE) ×3 IMPLANT
GLOVE BIOGEL PI IND STRL 7.0 (GLOVE) ×1 IMPLANT
GLOVE BIOGEL PI IND STRL 8 (GLOVE) ×1 IMPLANT
GLOVE BIOGEL PI INDICATOR 7.0 (GLOVE) ×2
GLOVE BIOGEL PI INDICATOR 8 (GLOVE) ×2
GOWN STRL REUS W/ TWL LRG LVL3 (GOWN DISPOSABLE) ×1 IMPLANT
GOWN STRL REUS W/TWL LRG LVL3 (GOWN DISPOSABLE) ×9
GUIDEROD T2 3X1000 (ROD) ×2 IMPLANT
K-WIRE  3.2X450M STR (WIRE) ×2
K-WIRE 3.2X450M STR (WIRE) ×1
KIT BASIN OR (CUSTOM PROCEDURE TRAY) ×3 IMPLANT
KIT ROOM TURNOVER OR (KITS) ×3 IMPLANT
KWIRE 3.2X450M STR (WIRE) IMPLANT
MANIFOLD NEPTUNE II (INSTRUMENTS) ×1 IMPLANT
NAIL LONG KIT TIGHR 10X400-125 (Nail) ×2 IMPLANT
NS IRRIG 1000ML POUR BTL (IV SOLUTION) ×3 IMPLANT
PACK GENERAL/GYN (CUSTOM PROCEDURE TRAY) ×3 IMPLANT
PAD ARMBOARD 7.5X6 YLW CONV (MISCELLANEOUS) ×8 IMPLANT
SCREW LAG GAMMA 3 TI 10.5X85MM (Screw) ×2 IMPLANT
SCREW LOCKING T2 F/T  5MMX40MM (Screw) ×2 IMPLANT
SCREW LOCKING T2 F/T 5MMX40MM (Screw) IMPLANT
SUT MNCRL AB 4-0 PS2 18 (SUTURE) IMPLANT
SUT MON AB 2-0 CT1 36 (SUTURE) IMPLANT
SUT VIC AB 0 CT1 27 (SUTURE) ×3
SUT VIC AB 0 CT1 27XBRD ANBCTR (SUTURE) ×1 IMPLANT
TOWEL OR 17X24 6PK STRL BLUE (TOWEL DISPOSABLE) ×3 IMPLANT
TOWEL OR 17X26 10 PK STRL BLUE (TOWEL DISPOSABLE) ×3 IMPLANT
WATER STERILE IRR 1000ML POUR (IV SOLUTION) ×1 IMPLANT

## 2015-08-22 NOTE — Progress Notes (Addendum)
Progress Note   PAHOLA DIMMITT FTD:[id number] DOB: [birth year] DOA: 08/21/2015 PCP: [name], MD   Brief Narrative:   Kendra Johns is an 64 y.o. 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 Alvy Bimler, who was admitted 08/21/15 with a chief complaint of a 1 month history of worsening right thigh pain resulting in inability to ambulate/bear weight. Plain films done in the ED showed a lytic lesion of the right femur.   Assessment/Plan:    Principal Problem:  Lytic bone lesion of right femur with intractable cancer associated pain - Continue Continue MS Contin 60 mg every 8 hours, MSIR 60 mg every 4 hours when necessary. - Increase Dilaudid 2-4 milligrams IV every 2 hours when necessary. - Palliative care consultation requested for assistance with symptom control. - Continue Decadron 4 mg IV every 6 hours. - Oncologist aware of admission. Radiation oncology consult requested (Dr. Lisbeth Renshaw has seen in past). - Will request orthopedic evaluation as well.  Active Problems:   Tobacco Abuse - Nicotine patch ordered.  Counseled.   Essential hypertension - Continue Cozaar. Pain may be contributing to elevated blood pressures. Control pain.   Hypothyroidism - Continue Synthroid.   Vitamin D deficiency - Continue vitamin D supplementation.   Multiple myeloma not having achieved remission (HCC)/ Status post bone marrow transplant ([place]) - Just completed her cycle of Revlimid. - Oncologist aware of admission.   Thrombocytopenia due to drugs - Platelet  count mildly reduced. Monitor.   DVT prophylaxis - Lovenox ordered.  Code Status: Full. Family Communication: No family at the bedside today. Disposition Plan: Home when pain controlled, likely several days.  IV Access:    Peripheral IV   Procedures and diagnostic studies:   Dg Femur 1v Right  08/21/2015  CLINICAL DATA:  Multiple myeloma, known RIGHT femoral tumor, increased pain for 1 day EXAM: RIGHT FEMUR 1 VIEW COMPARISON:  08/14/2015 FINDINGS: Osseous demineralization. Poorly defined lytic lesions are identified at the proximal and proximal/mid RIGHT femoral diaphysis compatible with multiple myeloma. Endosteal cortical thinning at the larger more proximal femoral lytic lesion. A small amount of periosteal reaction is identified adjacent to the more cranial of the proximal femoral lesions, located in the subtrochanteric region. Additionally, subtle linear cortical lucency is seen in the medial cortex of the proximal RIGHT femur inferior to the lesser trochanter likely representing a developing pathologic fracture. Tiny rounded lytic lesion of the distal RIGHT femoral metaphysis is noted. Knee and hip joint alignments normal. No additional fracture, dislocation or bone destruction. IMPRESSION: Lytic lesions within the RIGHT femoral diaphysis compatible with multiple myeloma. Periosteal reaction and subtle cortical linear lucency at thinned medial cortex of the proximal RIGHT femoral diaphysis inferior to the lesser trochanter likely representing a developing pathologic fracture. Electronically Signed   By: Lavonia Dana M.D.   On: 08/21/2015 16:45     Medical Consultants:    None.  Anti-Infectives:   Anti-infectives    None      Subjective:   Kendra Johns reports ongoing right leg pain, 8/10 with movement.  She was able to get some sleep last night.  Last  BM was yesterday a.m.  No N/V.    Objective:    Filed Vitals:   08/21/15 1800 08/21/15 1851 08/21/15 2119 08/22/15  0553  BP: 168/82 159/90 155/85 158/84  Pulse:  66 51 50  Temp:  98.2 F (36.8 C) 97.4 F (36.3 C) 98.2 F (36.8 C)  TempSrc:  Oral Oral Oral  Resp:  $Remo'18 18 18  'lIMph$ Height:  $Remove'5\' 4"'aVDHswL$  (1.626 m)    Weight:  66.497 kg (146 lb 9.6 oz)    SpO2:  100% 100% 98%    Intake/Output Summary (Last 24 hours) at 08/22/15 0709 Last data filed at 08/22/15 0557  Gross per 24 hour  Intake      0 ml  Output    600 ml  Net   -600 ml   Filed Weights   08/21/15 1851  Weight: 66.497 kg (146 lb 9.6 oz)    Exam: Gen:  NAD Cardiovascular:  RRR, No M/R/G Respiratory:  Lungs CTAB Gastrointestinal:  Abdomen soft, NT/ND, + BS Extremities:  No C/E/C   Data Reviewed:    Labs: Basic Metabolic Panel:  Recent Labs Lab 08/21/15 1711  NA 140  K 3.9  CL 107  CO2 25  GLUCOSE 120*  BUN 14  CREATININE 0.69  CALCIUM 9.2   GFR Estimated Creatinine Clearance: 65.7 mL/min (by C-G formula based on Cr of 0.69). Liver Function Tests: No results for input(s): AST, ALT, ALKPHOS, BILITOT, PROT, ALBUMIN in the last 168 hours. No results for input(s): LIPASE, AMYLASE in the last 168 hours. No results for input(s): AMMONIA in the last 168 hours. Coagulation profile No results for input(s): INR, PROTIME in the last 168 hours.  CBC:  Recent Labs Lab 08/21/15 1711  WBC 5.9  NEUTROABS 4.0  HGB 13.2  HCT 39.1  MCV 93.8  PLT 146*   Microbiology No results found for this or any previous visit (from the past 240 hour(s)).   Medications:   . aspirin  81 mg Oral Daily  . cholecalciferol  1,000 Units Oral Daily  . dexamethasone  4 mg Intravenous 4 times per day  . enoxaparin (LOVENOX) injection  40 mg Subcutaneous Q24H  . levothyroxine  50 mcg Oral QAC breakfast  . losartan  50 mg Oral Daily  . morphine  60 mg Oral 3 times per day  . senna  1 tablet Oral BID  . sodium chloride  3 mL Intravenous Q12H   Continuous Infusions:   Time spent: 35 minutes.  The patient is medically complex and requires high  complexity decision making & coordination of care with multiple specialists.    LOS: 1 day   RAMA,CHRISTINA  [facility] Pager [phone]. If unable to reach me by pager, please call my cell phone at [phone].  *Please refer to [email or web address], password [email or web address] to get updated schedule on who will round on this patient, as hospitalists switch teams weekly. If 7PM-7AM, please contact night-coverage at [email or web address], password [email or web address] for any overnight needs.  08/22/2015, 7:09 AM

## 2015-08-22 NOTE — Op Note (Signed)
DATE OF SURGERY:  08/22/2015  TIME: 4:13 PM  PATIENT NAME:  Kendra Johns  AGE: 64 y.o.  PRE-OPERATIVE DIAGNOSIS:  pathologic fracture right femur  POST-OPERATIVE DIAGNOSIS:  SAME  PROCEDURE:  INTRAMEDULLARY (IM) NAIL FEMORAL  SURGEON:  India Jolin D  ASSISTANT:  Lovett Calender, PA-C, She was present and scrubbed throughout the case, critical for completion in a timely fashion, and for retraction, instrumentation, and closure.   OPERATIVE IMPLANTS: Stryker Gamma Nail with distal interlock screw  PREOPERATIVE INDICATIONS:  Kendra Johns is a 64 y.o. year old who suffered a patholgic hip fracture. She was brought into the ER and then admitted and optimized and then elected for surgical intervention.    The risks benefits and alternatives were discussed with the patient including but not limited to the risks of nonoperative treatment, versus surgical intervention including infection, bleeding, nerve injury, malunion, nonunion, hardware prominence, hardware failure, need for hardware removal, blood clots, cardiopulmonary complications, morbidity, mortality, among others, and they were willing to proceed.    OPERATIVE PROCEDURE:  The patient was brought to the operating room and placed in the supine position. General anesthesia was administered, with a foley. She was placed on the fracture table.  Closed reduction was performed under C-arm guidance. The length of the femur was also measured using fluoroscopy. Time out was then performed after sterile prep and drape. She received preoperative antibiotics.  Incision was made proximal to the greater trochanter. A guidewire was placed in the appropriate position. Confirmation was made on AP and lateral views. The above-named nail was opened. I opened the proximal femur with a reamer. I then placed the nail by hand easily down. I did not need to ream the femur.  Once the nail was completely seated, I placed a guidepin into the femoral head  into the center center position. I measured the length, and then reamed the lateral cortex and up into the head. I then placed the lag screw. Slight compression was applied. Anatomic fixation achieved. Bone quality was mediocre.  I then secured the proximal interlocking bolt, and took off a half a turn, and then removed the instruments, and took final C-arm pictures AP and lateral the entire length of the leg.  I then used perfect circles technique to place a distal interlock screw.   Anatomic reconstruction was achieved, and the wounds were irrigated copiously and closed with Vicryl followed by staples and sterile gauze for the skin. The patient was awakened and returned to PACU in stable and satisfactory condition. There no complications and the patient tolerated the procedure well.  She will be weightbearing as tolerated, and will be on Chemical px  for a period of four weeks after discharge.   Kendra Johns, M.D.    This note was generated using a template and dragon dictation system. In light of that, I have reviewed the note and all aspects of it are applicable to this case. Any dictation errors are due to the computerized dictation system.

## 2015-08-22 NOTE — Anesthesia Preprocedure Evaluation (Addendum)
Anesthesia Evaluation  Patient identified by MRN, date of birth, ID band Patient awake    Reviewed: Allergy & Precautions, NPO status , Patient's Chart, lab work & pertinent test results  Airway Mallampati: III  TM Distance: >3 FB Neck ROM: Full    Dental  (+) Missing, Dental Advisory Given   Pulmonary shortness of breath, sleep apnea , Current Smoker,    breath sounds clear to auscultation       Cardiovascular hypertension, Pt. on medications  Rhythm:Regular Rate:Normal     Neuro/Psych Anxiety Depression negative neurological ROS     GI/Hepatic GERD  ,(+) Hepatitis -, C  Endo/Other  Hypothyroidism   Renal/GU negative Renal ROS     Musculoskeletal  (+) Arthritis ,   Abdominal   Peds  Hematology  (+) Blood dyscrasia, , Multiple myeloma   Anesthesia Other Findings   Reproductive/Obstetrics                            Lab Results  Component Value Date   WBC 5.9 08/21/2015   HGB 13.2 08/21/2015   HCT 39.1 08/21/2015   MCV 93.8 08/21/2015   PLT 146* 08/21/2015   Lab Results  Component Value Date   CREATININE 0.69 08/21/2015   BUN 14 08/21/2015   NA 140 08/21/2015   K 3.9 08/21/2015   CL 107 08/21/2015   CO2 25 08/21/2015    Anesthesia Physical Anesthesia Plan  ASA: III  Anesthesia Plan: General   Post-op Pain Management:    Induction: Intravenous  Airway Management Planned: Oral ETT  Additional Equipment:   Intra-op Plan:   Post-operative Plan: Extubation in OR  Informed Consent: I have reviewed the patients History and Physical, chart, labs and discussed the procedure including the risks, benefits and alternatives for the proposed anesthesia with the patient or authorized representative who has indicated his/her understanding and acceptance.   Dental advisory given  Plan Discussed with: CRNA  Anesthesia Plan Comments:         Anesthesia Quick Evaluation

## 2015-08-22 NOTE — Anesthesia Postprocedure Evaluation (Signed)
Anesthesia Post Note  Patient: Kendra Johns  Procedure(s) Performed: Procedure(s) (LRB): INTRAMEDULLARY (IM) NAIL FEMORAL (Right)  Anesthesia type: general  Patient location: PACU  Post pain: Pain level controlled  Post assessment: Patient's Cardiovascular Status Stable  Last Vitals:  Filed Vitals:   08/22/15 1806  BP:   Pulse: 53  Temp:   Resp: 9    Post vital signs: Reviewed and stable  Level of consciousness: sedated  Complications: No apparent anesthesia complications

## 2015-08-22 NOTE — Transfer of Care (Signed)
Immediate Anesthesia Transfer of Care Note  Patient: Kendra Johns  Procedure(s) Performed: Procedure(s): INTRAMEDULLARY (IM) NAIL FEMORAL (Right)  Patient Location: PACU  Anesthesia Type:General  Level of Consciousness: sedated  Airway & Oxygen Therapy: Patient Spontanous Breathing and Patient connected to nasal cannula oxygen  Post-op Assessment: Report given to RN and Post -op Vital signs reviewed and stable  Post vital signs: stable  Last Vitals:  Filed Vitals:   08/22/15 1639  BP: 157/90  Pulse: 79  Temp: 36.2 C  Resp: 19    Complications: No apparent anesthesia complications

## 2015-08-22 NOTE — Discharge Instructions (Signed)
INSTRUCTIONS ° °o Remove items at home which could result in a fall. This includes throw rugs or furniture in walking pathways °o ICE to the affected joint every three hours while awake for 30 minutes at a time, for at least the first 3-5 days, and then as needed for pain and swelling.  Continue to use ice for pain and swelling. You may notice swelling that will progress down to the foot and ankle.  This is normal after surgery.  Elevate your leg when you are not up walking on it.   °o Continue to use the breathing machine you got in the hospital (incentive spirometer) which will help keep your temperature down.  It is common for your temperature to cycle up and down following surgery, especially at night when you are not up moving around and exerting yourself.  The breathing machine keeps your lungs expanded and your temperature down. ° ° °DIET:  As you were doing prior to hospitalization, we recommend a well-balanced diet. ° °DRESSING / WOUND CARE / SHOWERING ° °Keep the surgical dressing until follow up.  IF THE DRESSING FALLS OFF or the wound gets wet inside, change the dressing with sterile gauze.  Please use good hand washing techniques before changing the dressing.  Do not use any lotions or creams on the incision until instructed by your surgeon.   ° °ACTIVITY ° °o Increase activity slowly as tolerated, but follow the weight bearing instructions below.   °o No driving for 6 weeks or until further direction given by your physician.  You cannot drive while taking narcotics.  °o No lifting or carrying greater than 10 lbs. until further directed by your surgeon. °o Avoid periods of inactivity such as sitting longer than an hour when not asleep. This helps prevent blood clots.  °o You may return to work once you are authorized by your doctor.  ° ° °WEIGHT BEARING  ° °Weight bearing as tolerated with assist device (walker, cane, etc) as directed, use it as long as suggested by your surgeon or therapist, typically  at least 4-6 weeks. ° ° °CONSTIPATION ° °Constipation is defined medically as fewer than three stools per week and severe constipation as less than one stool per week.  Even if you have a regular bowel pattern at home, your normal regimen is likely to be disrupted due to multiple reasons following surgery.  Combination of anesthesia, postoperative narcotics, change in appetite and fluid intake all can affect your bowels.  ° °YOU MUST use at least one of the following options; they are listed in order of increasing strength to get the job done.  They are all available over the counter, and you may need to use some, POSSIBLY even all of these options:   ° °Drink plenty of fluids (prune juice may be helpful) and high fiber foods °Colace 100 mg by mouth twice a day  °Senokot for constipation as directed and as needed Dulcolax (bisacodyl), take with full glass of water  °Miralax (polyethylene glycol) once or twice a day as needed. ° °If you have tried all these things and are unable to have a bowel movement in the first 3-4 days after surgery call either your surgeon or your primary doctor.   ° °If you experience loose stools or diarrhea, hold the medications until you stool forms back up.  If your symptoms do not get better within 1 week or if they get worse, check with your doctor.  If you experience "the worst   abdominal pain ever" or develop nausea or vomiting, please contact the office immediately for further recommendations for treatment. ° ° °ITCHING:  If you experience itching with your medications, try taking only a single pain pill, or even half a pain pill at a time.  You can also use Benadryl over the counter for itching or also to help with sleep.  ° °TED HOSE STOCKINGS:  Use stockings on both legs until for at least 2 weeks or as directed by physician office. They may be removed at night for sleeping. ° °MEDICATIONS:  See your medication summary on the “After Visit Summary” that nursing will review with you.   You may have some home medications which will be placed on hold until you complete the course of blood thinner medication.  It is important for you to complete the blood thinner medication as prescribed. ° °PRECAUTIONS:  If you experience chest pain or shortness of breath - call 911 immediately for transfer to the hospital emergency department.  ° °If you develop a fever greater that 101 F, purulent drainage from wound, increased redness or drainage from wound, foul odor from the wound/dressing, or calf pain - CONTACT YOUR SURGEON.   °                                                °FOLLOW-UP APPOINTMENTS:  If you do not already have a post-op appointment, please call the office for an appointment to be seen by your surgeon.  Guidelines for how soon to be seen are listed in your “After Visit Summary”, but are typically between 1-4 weeks after surgery. ° °MAKE SURE YOU:  °• Understand these instructions.  °• Get help right away if you are not doing well or get worse.  ° ° °Thank you for letting us be a part of your medical care team.  It is a privilege we respect greatly.  We hope these instructions will help you stay on track for a fast and full recovery!  ° °

## 2015-08-22 NOTE — Anesthesia Procedure Notes (Signed)
Procedure Name: Intubation Date/Time: 08/22/2015 3:13 PM Performed by: Luciana Axe K Pre-anesthesia Checklist: Patient identified, Emergency Drugs available, Suction available, Patient being monitored and Timeout performed Patient Re-evaluated:Patient Re-evaluated prior to inductionOxygen Delivery Method: Circle system utilized Preoxygenation: Pre-oxygenation with 100% oxygen Intubation Type: IV induction Ventilation: Mask ventilation without difficulty and Oral airway inserted - appropriate to patient size Laryngoscope Size: Miller and 2 Grade View: Grade I Tube type: Oral Tube size: 7.0 mm Number of attempts: 1 Airway Equipment and Method: Stylet Placement Confirmation: ETT inserted through vocal cords under direct vision,  positive ETCO2,  CO2 detector and breath sounds checked- equal and bilateral Secured at: 22 cm Tube secured with: Tape Dental Injury: Teeth and Oropharynx as per pre-operative assessment

## 2015-08-22 NOTE — Progress Notes (Signed)
Palliative consult received, chart reviewed. As I arrived at [facility] earlier today, the patient was being transported to [facility].  64 yo lady with history of multiple myeloma, now admitted with Pathologic fracture of the R thigh, known history of recent progression of her disease in L1 vertebra.  Consult placed for pain management: pain regimen reviewed in detail, patient already on opioids IV as well as agree with scheduled dexamethasone. Only other recommendation would be for one time use of Pamidronate. Will see the patient by 08-23-15 as the patient is to undergo IM nail of R femur by orthopedics today.  Thank you for the consult.  [name] MD [phone] [place] palliative medicine team

## 2015-08-22 NOTE — Addendum Note (Signed)
Encounter addended by: Shammond Arave B Tauriel Scronce, RN on: 08/22/2015 11:28 AM<BR>     Documentation filed: Charges VN

## 2015-08-22 NOTE — Consult Note (Signed)
ORTHOPAEDIC CONSULTATION  REQUESTING PHYSICIAN: Venetia Maxon Rama, MD  Chief Complaint: pathologic fracture of R femur  HPI: Kendra Johns is a 64 y.o. female who complains of R thigh pain.  She was seen in the ED for uncontrolled pain of the R leg and inability to ambulate.  She has a hx of multiple myeloma (2008) and is actively receiving chemotherapy at this time.  The patient also had a stem cell transplant in 2009. She relapsed in 2012 and had a bone marrow transplant in 2013.  The patient is followed by Dr. Alvy Bimler.  Recent MRI of the L spine show progressive L1 vertebral disease. The patient reports that the R thigh pain has been gradually worsening for the past month. Xrays in the ED revealed pathologic fracture of the R femur. Palliative can consult has been ordered.   Past Medical History  Diagnosis Date  . Depression   . Hypertension   . Hepatitis C     genotype 1  . Diverticulosis of colon   . Multiple myeloma     kappa light chain, s/p high-dose chemo/stem cell tx - [facility]  . Hx of adenomatous colonic polyps 2007  . Anxiety   . Osteoarthritis   . Sleep apnea   . Hyperlipidemia   . GERD with stricture   . External hemorrhoids   . Hepatitis B virus infection   . IBS (irritable bowel syndrome)   . Helicobacter pylori gastritis 2001    ? if treated  . Sciatic nerve pain     with Dr. Earle Gell  . Chronic low back pain   . Unspecified vitamin D deficiency 09/22/2013  . Allergy   . Fever 11/28/2013  . Dehydration 11/28/2013  . Nausea alone 01/22/2014  . Cough 08/02/2014  . DIVERTICULOSIS, COLON 08/31/2006  . DEPRESSION 08/31/2006    Qualifier: Diagnosis of  By: [name], [name]    . Muscle cramp 11/21/2014  . Chronic fatigue 03/07/2015  . Vitamin D deficiency 05/28/2015  . S/P radiation therapy 06/05/15-06/18/15    C7-T1    Past Surgical History  Procedure Laterality Date  . Abdominal hysterectomy    . Oophorectomy    . Dilation and curettage of uterus    .  Bone marrow transplant      2009, 2013 [facility]  . Appendectomy    . Colonoscopy w/ polypectomy  08/2006    1-2 adenomas, 6 polyps total, diverticulosis and external hemorrhoids  . Upper gastrointestinal endoscopy  08/2003    esophageal stricture dilation, hiatal hernia, gastrritis  . Cholecystectomy    . Tonsillectomy and adenoidectomy     Social History   Social History  . Marital Status: Married    Spouse Name: Clemence Lengyel.  . Number of Children: 2  . Years of Education: N/A   Occupational History  . Security    Social History Main Topics  . Smoking status: Current Every Day Smoker -- 0.25 packs/day for 44 years    Types: Cigarettes  . Smokeless tobacco: Never Used  . Alcohol Use: No  . Drug Use: No  . Sexual Activity: No   Other Topics Concern  . None   Social History Narrative   Lives with husband and grandson.  Ambulates with a walker at baseline.   Family History  Problem Relation Age of Onset  . Alcohol abuse    . Lung cancer Father     smoked  . Leukemia Mother   . Cirrhosis Sister   .  Colon cancer Neg Hx   . Lung cancer Sister     smoked   Allergies  Allergen Reactions  . Codeine Itching  . Ibuprofen Other (See Comments)    REACTION: gi trouble  . Albuterol Hives    Pt tolerated albuterol neb without issue 12/2014   Prior to Admission medications   Medication Sig Start Date End Date Taking? Authorizing Provider  aspirin 81 MG tablet Take 81 mg by mouth daily.   Yes Historical Provider, MD  Cholecalciferol (VITAMIN D3) 5000 UNITS TABS Take 5,000 mg by mouth every morning. 12/05/14  Yes Heath Lark, MD  cyclobenzaprine (FLEXERIL) 10 MG tablet Take 1 tablet (10 mg total) by mouth 3 (three) times daily as needed for muscle spasms. 07/10/15  Yes Heath Lark, MD  dexamethasone (DECADRON) 4 MG tablet TAKE 1 TABLET (4 MG TOTAL) BY MOUTH DAILY. 08/14/15  Yes Heath Lark, MD  ergocalciferol (VITAMIN D2) 50000 UNITS capsule Take 1 capsule (50,000 Units total) by mouth  once a week. 05/28/15  Yes Heath Lark, MD  ipratropium (ATROVENT HFA) 17 MCG/ACT inhaler Inhale 2 puffs into the lungs every 4 (four) hours as needed for wheezing. 09/13/14  Yes Heath Lark, MD  lenalidomide (REVLIMID) 25 MG capsule TAKE ONE CAPSULE BY MOUTH EVERY DAY FOR 21 DAYS THEN 7 DAYS OFF. 08/01/15  Yes Heath Lark, MD  levothyroxine (SYNTHROID) 50 MCG tablet Take 1 tablet (50 mcg total) by mouth daily before breakfast. 04/17/15  Yes Heath Lark, MD  lidocaine-prilocaine (EMLA) cream Apply 1 application topically as needed. Patient taking differently: Apply 1 application topically daily as needed (for port).  05/28/15  Yes Heath Lark, MD  losartan (COZAAR) 50 MG tablet TAKE 1/2 TO 1 TABLET DAILY Patient taking differently: Take 1 tablet by mouth daily 07/23/15  Yes Marin Olp, MD  magic mouthwash SOLN Take 10 mLs by mouth 4 (four) times daily as needed for mouth pain. Swish and spit  Or  Swish and swallow. 07/10/15  Yes Heath Lark, MD  magnesium hydroxide (MILK OF MAGNESIA) 400 MG/5ML suspension Take 30 mLs by mouth daily as needed for mild constipation (constipation).    Yes Historical Provider, MD  morphine (MS CONTIN) 60 MG 12 hr tablet Take 60 mg by mouth every 8 (eight) hours.  05/25/15  Yes Historical Provider, MD  morphine (MSIR) 30 MG tablet Take 30 mg by mouth every 4 (four) hours as needed for severe pain (pain).    Yes Historical Provider, MD  PRESCRIPTION MEDICATION Chemo at [facility] every other week   Yes Historical Provider, MD  prochlorperazine (COMPAZINE) 10 MG tablet Take 1 tablet (10 mg total) by mouth every 6 (six) hours as needed for nausea. 07/10/15  Yes Heath Lark, MD  sucralfate (CARAFATE) 1 G tablet Take 1 tablet (1 g total) by mouth 4 (four) times daily. Patient taking differently: Take 1 g by mouth 4 (four) times daily as needed (stomach ulcers/ pain).  06/17/15  Yes Kyung Rudd, MD   Dg Femur 1v Right  08/21/2015  CLINICAL DATA:  Multiple myeloma, known RIGHT femoral tumor,  increased pain for 1 day EXAM: RIGHT FEMUR 1 VIEW COMPARISON:  08/14/2015 FINDINGS: Osseous demineralization. Poorly defined lytic lesions are identified at the proximal and proximal/mid RIGHT femoral diaphysis compatible with multiple myeloma. Endosteal cortical thinning at the larger more proximal femoral lytic lesion. A small amount of periosteal reaction is identified adjacent to the more cranial of the proximal femoral lesions, located in the subtrochanteric region. Additionally,  subtle linear cortical lucency is seen in the medial cortex of the proximal RIGHT femur inferior to the lesser trochanter likely representing a developing pathologic fracture. Tiny rounded lytic lesion of the distal RIGHT femoral metaphysis is noted. Knee and hip joint alignments normal. No additional fracture, dislocation or bone destruction. IMPRESSION: Lytic lesions within the RIGHT femoral diaphysis compatible with multiple myeloma. Periosteal reaction and subtle cortical linear lucency at thinned medial cortex of the proximal RIGHT femoral diaphysis inferior to the lesser trochanter likely representing a developing pathologic fracture. Electronically Signed   By: Lavonia Dana M.D.   On: 08/21/2015 16:45    Positive ROS: All other systems have been reviewed and were otherwise negative with the exception of those mentioned in the HPI and as above.  Labs cbc  Recent Labs  08/21/15 1711  WBC 5.9  HGB 13.2  HCT 39.1  PLT 146*    Labs inflam No results for input(s): CRP in the last 72 hours.  Invalid input(s): ESR  Labs coag No results for input(s): INR, PTT in the last 72 hours.  Invalid input(s): PT   Recent Labs  08/21/15 1711  NA 140  K 3.9  CL 107  CO2 25  GLUCOSE 120*  BUN 14  CREATININE 0.69  CALCIUM 9.2    Physical Exam: Filed Vitals:   08/22/15 0553  BP: 158/84  Pulse: 50  Temp: 98.2 F (36.8 C)  Resp: 18   General: Alert, no acute distress Cardiovascular: No pedal  edema Respiratory: No cyanosis, no use of accessory musculature GI: No organomegaly, abdomen is soft and non-tender Skin: No lesions in the area of chief complaint other than those listed below in MSK exam.  Neurologic: Sensation intact distally Psychiatric: Patient is competent for consent with normal mood and affect Lymphatic: No axillary or cervical lymphadenopathy  MUSCULOSKELETAL:  R thigh has no erythema/warmth.  Pain with ROM of the R leg.  Pain with log roll.  Sensation intact with 2+ distal pulses.  Other extremities are atraumatic with painless ROM and NVI.  Assessment: Pathologic fracture of the R thigh with history of multiple myeloma  Plan: Plan to take to the OR today for IM nail of the R femur for the pathologic fracture of the R femur.  Patient will remain NWB in the R leg until surgery.  NPO.    Bland Span Cell [id number]   08/22/2015 9:31 AM

## 2015-08-22 NOTE — Progress Notes (Signed)
[place] NOTE  Patient Care Team: Marin Olp, MD as PCP - General (Family Medicine) Jeanann Lewandowsky, MD as Consulting Physician (Medical Oncology) Marin Olp, MD as Consulting Physician (Family Medicine) Heath Lark, MD as Consulting Physician (Hematology and Oncology)  CHIEF COMPLAINTS/PURPOSE OF CONSULTATION:  Worsening pain, new lytic lesion on the right femur  HISTORY OF PRESENTING ILLNESS:  Kendra Johns 64 y.o. female was admitted to the hospital through the emergency department due to severe, uncontrolled pain in the right thigh. This patient had background history of multiple myeloma status post transplant and was receiving active chemotherapy. 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   01/03/2007 - 01/10/2007 Radiation Therapy Approximate date only, received RT for plasmacytoma followed by surgery   09/14/2007 Initial Diagnosis MULTIPLE  MYELOMA   10/05/2007 Bone Marrow Biopsy Bm biopsy was negative   06/18/2008 Bone Marrow Biopsy BM biopsy was negative   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 [facility]   04/13/2011 Relapse/Recurrence Disease relapse   04/14/2011 Bone Marrow Biopsy Bm biopsy showed 2 % plasma cell   04/27/2011 Relapse/Recurrence Disease relapse, treated with Velcade/Cytoxan/Dex   09/07/2011 - 07/28/2013 Chemotherapy She has been receiving Velcade   12/27/2011 Bone Marrow Transplant 2nd transplant at [facility]   07/28/2013 Relapse/Recurrence Chemo is stopped due to progression of disease   08/29/2013 Imaging PEt/CT showed recurrence of disease with new lesion  on her rib with compression fracture   09/13/2013 Bone Marrow Biopsy BM biopsy is hypercellular with 6% plasma cell   10/10/2013 - 10/20/2013 Radiation Therapy Started on palliative XRT for rib pain   11/27/2013 - 04/06/2014 Chemotherapy The patient starts chemotherapy with Carfilzomib   07/19/2014 Imaging Repeat bloodwork and PET/CT scan shows significant disease progression.   08/02/2014 - 03/06/2015 Chemotherapy She enrolled in clinical trial using combination therapy with Revlimid, dexamethasone and Elotuzumab   03/07/2015 - 06/04/2015 Chemotherapy She is on maintenance Revlimid only without dexamethasone   05/27/2015 Imaging  MRI spine showed new compression fracture.   06/04/2015 - 06/18/2015 Radiation Therapy  she received palliative radiation therapy to 25 Gy C7-T1   07/10/2015 -  Chemotherapy She received Elotuzumab, dex and Revlimid   08/01/2015 Imaging MRI of the spine was performed today due to new onset of worsening right hip pain/flank area. MRI show mild progression of the L1 vertebral body.   She has been complaining of worsening pain since the last few weeks. She has appointment pending to see radiation oncology but has severe intractable pain that she could not even get out of bed. In the emergency department, she was found to have new lytic lesion in the right femur suspicious for impeding fracture. She was admitted for medical management. Overnight, she received multiple doses of intravenous pain medicine with reasonable pain control. Apart from the pain in the femur she denies pain elsewhere. Denies recent infection.  MEDICAL HISTORY:  Past Medical History  Diagnosis Date  . Depression   . Hypertension   . Hepatitis C     genotype 1  . Diverticulosis of colon   . Multiple myeloma     kappa  light chain, s/p high-dose chemo/stem cell tx - [facility]  . Hx of adenomatous colonic polyps 2007  . Anxiety   . Osteoarthritis   . Sleep apnea   . Hyperlipidemia   . GERD with stricture   .  External hemorrhoids   . Hepatitis B virus infection   . IBS (irritable bowel syndrome)   . Helicobacter pylori gastritis 2001    ? if treated  . Sciatic nerve pain     with Dr. Earle Gell  . Chronic low back pain   . Unspecified vitamin D deficiency 09/22/2013  . Allergy   . Fever 11/28/2013  . Dehydration 11/28/2013  . Nausea alone 01/22/2014  . Cough 08/02/2014  . DIVERTICULOSIS, COLON 08/31/2006  . DEPRESSION 08/31/2006    Qualifier: Diagnosis of  By: [name], [name]    . Muscle cramp 11/21/2014  . Chronic fatigue 03/07/2015  . Vitamin D deficiency 05/28/2015  . S/P radiation therapy 06/05/15-06/18/15    C7-T1     SURGICAL HISTORY: Past Surgical History  Procedure Laterality Date  . Abdominal hysterectomy    . Oophorectomy    . Dilation and curettage of uterus    . Bone marrow transplant      2009, 2013 [facility]  . Appendectomy    . Colonoscopy w/ polypectomy  08/2006    1-2 adenomas, 6 polyps total, diverticulosis and external hemorrhoids  . Upper gastrointestinal endoscopy  08/2003    esophageal stricture dilation, hiatal hernia, gastrritis  . Cholecystectomy    . Tonsillectomy and adenoidectomy      SOCIAL HISTORY: Social History   Social History  . Marital Status: Married    Spouse Name: Gerrica Cygan.  . Number of Children: 2  . Years of Education: N/A   Occupational History  . Security    Social History Main Topics  . Smoking status: Current Every Day Smoker -- 0.25 packs/day for 44 years    Types: Cigarettes  . Smokeless tobacco: Never Used  . Alcohol Use: No  . Drug Use: No  . Sexual Activity: No   Other Topics Concern  . Not on file   Social History Narrative   Lives with husband and grandson.  Ambulates with a walker at baseline.    FAMILY HISTORY: Family History  Problem Relation Age of Onset  . Alcohol abuse    . Lung cancer Father     smoked  . Leukemia Mother   . Cirrhosis Sister   . Colon cancer Neg Hx   . Lung cancer Sister      smoked    ALLERGIES:  is allergic to codeine; ibuprofen; and albuterol.  MEDICATIONS:  Current Facility-Administered Medications  Medication Dose Route Frequency Provider Last Rate Last Dose  . 0.9 %  sodium chloride infusion  250 mL Intravenous PRN Venetia Maxon Rama, MD      . acetaminophen (TYLENOL) tablet 650 mg  650 mg Oral Q6H PRN Venetia Maxon Rama, MD       Or  . acetaminophen (TYLENOL) suppository 650 mg  650 mg Rectal Q6H PRN Christina P Rama, MD      . alum & mag hydroxide-simeth (MAALOX/MYLANTA) 200-200-20 MG/5ML suspension 30 mL  30 mL Oral Q6H PRN Christina P Rama, MD      . aspirin chewable tablet 81 mg  81 mg Oral Daily Christina P Rama, MD      . cholecalciferol (VITAMIN D) tablet 1,000 Units  1,000 Units Oral Daily Venetia Maxon Rama, MD  1,000 Units at 08/21/15 2114  . cyclobenzaprine (FLEXERIL) tablet 10 mg  10 mg Oral TID PRN Venetia Maxon Rama, MD      . dexamethasone (DECADRON) injection 4 mg  4 mg Intravenous 4 times per day Venetia Maxon Rama, MD   4 mg at 08/22/15 0620  . enoxaparin (LOVENOX) injection 40 mg  40 mg Subcutaneous Q24H Venetia Maxon Rama, MD   40 mg at 08/21/15 2115  . HYDROmorphone (DILAUDID) injection 1-2 mg  1-2 mg Intravenous Q2H PRN Venetia Maxon Rama, MD   2 mg at 08/21/15 2009  . ipratropium (ATROVENT) nebulizer solution 0.5 mg  0.5 mg Nebulization Q4H PRN Christina P Rama, MD      . levothyroxine (SYNTHROID, LEVOTHROID) tablet 50 mcg  50 mcg Oral QAC breakfast Christina P Rama, MD      . lidocaine-prilocaine (EMLA) cream   Topical Once Heath Lark, MD      . losartan (COZAAR) tablet 50 mg  50 mg Oral Daily Christina P Rama, MD      . magic mouthwash  10 mL Oral QID PRN Venetia Maxon Rama, MD      . magnesium hydroxide (MILK OF MAGNESIA) suspension 30 mL  30 mL Oral Daily PRN Venetia Maxon Rama, MD      . morphine (MS CONTIN) 12 hr tablet 60 mg  60 mg Oral 3 times per day Venetia Maxon Rama, MD   60 mg at 08/22/15 0620  . morphine (MSIR) tablet 60 mg  60 mg Oral  Q4H PRN Charlesetta Shanks, MD   60 mg at 08/22/15 0620  . ondansetron (ZOFRAN) tablet 4 mg  4 mg Oral Q6H PRN Christina P Rama, MD       Or  . ondansetron (ZOFRAN) injection 4 mg  4 mg Intravenous Q6H PRN Venetia Maxon Rama, MD      . senna (SENOKOT) tablet 8.6 mg  1 tablet Oral BID Venetia Maxon Rama, MD   8.6 mg at 08/21/15 2114  . sodium chloride 0.9 % injection 3 mL  3 mL Intravenous Q12H Venetia Maxon Rama, MD   3 mL at 08/21/15 2115  . sodium chloride 0.9 % injection 3 mL  3 mL Intravenous PRN Christina P Rama, MD      . sucralfate (CARAFATE) tablet 1 g  1 g Oral QID PRN Venetia Maxon Rama, MD      . zolpidem (AMBIEN) tablet 5 mg  5 mg Oral QHS PRN Venetia Maxon Rama, MD       Facility-Administered Medications Ordered in Other Encounters  Medication Dose Route Frequency Provider Last Rate Last Dose  . sodium chloride 0.9 % injection 10 mL  10 mL Intravenous PRN Heath Lark, MD   10 mL at 02/26/14 1425  . sodium chloride 0.9 % injection 10 mL  10 mL Intravenous PRN Chauncey Cruel, MD   10 mL at 04/05/14 1333    REVIEW OF SYSTEMS:   Constitutional: Denies fevers, chills or abnormal night sweats Eyes: Denies blurriness of vision, double vision or watery eyes Ears, nose, mouth, throat, and face: Denies mucositis or sore throat Respiratory: Denies cough, dyspnea or wheezes Cardiovascular: Denies palpitation, chest discomfort or lower extremity swelling Gastrointestinal:  Denies nausea, heartburn or change in bowel habits Skin: Denies abnormal skin rashes Lymphatics: Denies new lymphadenopathy or easy bruising Neurological:Denies numbness, tingling or new weaknesses Behavioral/Psych: Mood is stable, no new changes  All other systems were reviewed with the patient and are negative.  PHYSICAL EXAMINATION:  ECOG PERFORMANCE STATUS: 3 - Symptomatic, >50% confined to bed  Filed Vitals:   08/22/15 0553  BP: 158/84  Pulse: 50  Temp: 98.2 F (36.8 C)  Resp: 18   Filed Weights   08/21/15 1851   Weight: 146 lb 9.6 oz (66.497 kg)    GENERAL:alert, in mild distress from pain, appears uncomfortable SKIN: skin color, texture, turgor are normal, no rashes or significant lesions EYES: normal, conjunctiva are pink and non-injected, sclera clear OROPHARYNX:no exudate, no erythema and lips, buccal mucosa, and tongue normal  NECK: supple, thyroid normal size, non-tender, without nodularity LYMPH:  no palpable lymphadenopathy in the cervical, axillary or inguinal LUNGS: clear to auscultation and percussion with normal breathing effort HEART: regular rate & rhythm and no murmurs and no lower extremity edema ABDOMEN:abdomen soft, non-tender and normal bowel sounds Musculoskeletal:no cyanosis of digits and no clubbing  PSYCH: alert & oriented x 3 with fluent speech NEURO: no focal motor/sensory deficits  LABORATORY DATA:  I have reviewed the data as listed Lab Results  Component Value Date   WBC 5.9 08/21/2015   HGB 13.2 08/21/2015   HCT 39.1 08/21/2015   MCV 93.8 08/21/2015   PLT 146* 08/21/2015    Recent Labs  09/14/14 1436  12/28/14 1933  04/17/15 1122 07/24/15 1042 08/07/15 0932 08/21/15 1711  NA 141  < > 140  < > 140 142 140 140  K 3.6*  < > 3.8  < > 3.5 3.7 3.3* 3.9  CL 105  --  109  --   --   --   --  107  CO2 19  < > 24  < > _0 GLUCOSE 187*  < > 144*  < > 103 96 106 120*  BUN 15  < > 14  < > 10.3 11.2 11.8 14  CREATININE 0.60  < > 0.67  < > 0.8 0.7 0.8 0.69  CALCIUM 9.0  < > 8.8  < > 8.8 8.7 9.1 9.2  GFRNONAA >90  --  >90  --   --   --   --  >60  GFRAA >90  --  >90  --   --   --   --  >60  PROT  --   < >  --   < > 6.3* 5.8* 6.2*  --   ALBUMIN  --   < >  --   < > 3.4* 3.3* 3.6  --   AST  --   < >  --   < > _1 --   ALT  --   < >  --   < > _2 --   ALKPHOS  --   < >  --   < > 86 92 100  --   BILITOT  --   < >  --   < > 0.54 0.53 0.39  --   < > = values in this interval not displayed.  RADIOGRAPHIC STUDIES: I have personally reviewed the  radiological images as listed and agreed with the findings in the report. Mr Total Spine Mets Screening  08/01/2015  CLINICAL DATA:  64 year old female with multiple myeloma, spine pain radiating to the right hip and lower extremity for 3 weeks. Right lower extremity weakness and numbness. Chemotherapy. Palliative radiation therapy to C7-T1 completed in August. Subsequent encounter. EXAM: MRI TOTAL SPINE WITHOUT AND WITH CONTRAST TECHNIQUE: Multisequence MR imaging of the spine from the cervical spine  to the sacrum was performed prior to and following IV contrast administration for evaluation of spinal metastatic disease. CONTRAST:  13 mL MultiHance COMPARISON:  Total spine exam 05/27/2015. FINDINGS: Cervical Findings: Grossly negative visualized brain parenchyma. Skullbase through C3 levels appear stable and without tumor. Stable C4 compression fracture, no marrow edema or enhancement. C5 and C6 levels appear stable and without tumor. C7 level described in the thoracic section below. No malignant cervical spinal stenosis. No cervical spinal cord signal abnormality. No cervical spine intradural enhancement. Visible paraspinal soft tissues appear stable without tumor. Thoracic Findings: Improved appearance of T1 status post radiation. Regressed expansile tumor. Severe compression of the T1 vertebra but no retropulsion. Continued marrow edema and enhancement not involving the posterior elements. The adjacent C7 inferior endplate appears mildly heterogeneous but without associated enhancement to strongly suggest tumor. The C7 level otherwise remains within normal limits. Heterogeneity of the T2 and T3 vertebral bodies is stable with patchy enhancement (T3 posterior inferior vertebral body series 13, image 6). Right T7 rib tumor partially visible on series 5, image 3 and appears stable to mildly regressed (14-15 mm diameter now versus 17 mm in July). Otherwise no definite tumor T4 through T9. Posterior element tumor  at T10 with marrow edema and enhancement has not significantly changed. No epidural or paraspinal extension. No tumor identified at T11 or T12. No thoracic spinal stenosis, spinal cord signal abnormality, or abnormal dural thickening/ enhancement. Lumbar Findings: Progressed L1 vertebral body tumor, subtotal vertebral body involvement now (series 9, image 6). Early pedicle involvement bilaterally. No posterior element involvement. No epidural or paraspinal extension. Posterior inferior L3 vertebral body tumor appears stable since July. No epidural or paraspinal extension. Treated L5 pathologic fracture appears stable with mild residual edema and enhancement. No tumor identified at L2 or L4. No central sacral tumor identified. No malignant lumbar spinal stenosis. Cauda equina nerve roots appear within normal limits. IMPRESSION: 1. Stable cervical spine, no definite involvement by myeloma. 2. Improved appearance of T1 status post radiation. Right T7 rib tumor also appears mildly regressed. 3. Stable lesser myelomatous involvement at T2, T3 and T10. 4. Mild progression of L1 vertebral body tumor. No epidural or paraspinal involvement. 5. L3 vertebral body involvement and treated pathologic fracture at L5 are stable. 6. No malignant spinal stenosis. No spinal cord or cauda equina abnormality. Electronically Signed   By: Genevie Ann M.D.   On: 08/01/2015 14:47   Dg Hip Unilat With Pelvis 2-3 Views Right  08/14/2015  CLINICAL DATA:  Pain.  Multiple myeloma. EXAM: DG HIP (WITH OR WITHOUT PELVIS) 2-3V RIGHT COMPARISON:  None. FINDINGS: Degenerative changes lumbar spine and both hips. Diffuse progress osteopenia. This may be secondary to myeloma. Prominent lytic lesions proximal right femoral shaft. Scratched lytic lesion right iliac wing cannot be excluded. Methylmethacrylate again noted L5 and in the right sacral wing. Pelvic calcifications consistent phleboliths . IMPRESSION: Progressive diffuse osteopenia with lytic  lesions in the right proximal femur and and possibly the right iliac wing. Multiple myeloma could present in this fashion . No evidence of fracture. The proximal right femoral shaft lytic lesion is prominent and would be prone to fracture. Orthopedic/radiation therapy consultation should be considered . Electronically Signed   By: Marcello Moores  Register   On: 08/14/2015 13:38   Dg Femur 1v Right  08/21/2015  CLINICAL DATA:  Multiple myeloma, known RIGHT femoral tumor, increased pain for 1 day EXAM: RIGHT FEMUR 1 VIEW COMPARISON:  08/14/2015 FINDINGS: Osseous demineralization. Poorly defined lytic lesions  are identified at the proximal and proximal/mid RIGHT femoral diaphysis compatible with multiple myeloma. Endosteal cortical thinning at the larger more proximal femoral lytic lesion. A small amount of periosteal reaction is identified adjacent to the more cranial of the proximal femoral lesions, located in the subtrochanteric region. Additionally, subtle linear cortical lucency is seen in the medial cortex of the proximal RIGHT femur inferior to the lesser trochanter likely representing a developing pathologic fracture. Tiny rounded lytic lesion of the distal RIGHT femoral metaphysis is noted. Knee and hip joint alignments normal. No additional fracture, dislocation or bone destruction. IMPRESSION: Lytic lesions within the RIGHT femoral diaphysis compatible with multiple myeloma. Periosteal reaction and subtle cortical linear lucency at thinned medial cortex of the proximal RIGHT femoral diaphysis inferior to the lesser trochanter likely representing a developing pathologic fracture. Electronically Signed   By: Lavonia Dana M.D.   On: 08/21/2015 16:45   Dg Femur, Min 2 Views Right  08/14/2015  CLINICAL DATA:  Two weeks of right knee pain without reported trauma; multiple myeloma not in remission, currently on chemotherapy. EXAM: RIGHT FEMUR 2 VIEWS COMPARISON:  Images of the right femur and right knee dated May 24, 2014 FINDINGS: There is a5 mm diameter well-circumscribed lucency in the distal third of shaft of the right femur which is stable. Lucencies previously demonstrated more inferiorly are less well defined and may have increased in size. No cortical disruption is observed. The knee joint compartments appear reasonably well maintained. There is beaking of the tibial spines and tiny spurs from the articular margins of the patella. IMPRESSION: Lucencies in the distal third of the femur which likely reflect myelomatous involvement. There are mild degenerative changes of the right knee. Electronically Signed   By: David  Martinique M.D.   On: 08/14/2015 13:37    ASSESSMENT & PLAN:  Multiple myeloma Unfortunately, the patient appeared to be progressing through treatment. I will discontinue her current treatment and will schedule a visit in the outpatient to change her treatment to a different kind. She may also benefit from returning to [facility] for further management  Severe uncontrolled pain due to lytic lesions Recently, MRI spine disclosed new lytic lesion and she was supposed to see radiation oncologist to begin radiation treatment. Now, she has new lesion in the right femur. In addition to radiation oncology consultation, I will also consult orthopedic surgery for discussion whether surgery is needed and recommendation regarding weightbearing The patient was seen as pain specialist in the outpatient. She will receive intravenous pain medicine while hospitalized to get her pain is under control. I agree with palliative care consultation for symptom management In the meantime, I will recommend scheduled dexamethasone.  Hypertension Her blood pressure is worse, suspect exacerbated by recent pain. Continue medical management.  Smoker The patient will not be able to smoke while hospitalized. She may benefit from nicotine patch  CODE STATUS Currently the patient is full code  Discharge  planning Unable to be discharged due to uncontrolled pain. Likely next week.  I will be away this weekend. I will return to check on the patient next week. If questions arise, please call oncology on call.  All questions were answered. The patient knows to call the clinic with any problems, questions or concerns.    [place], [place], MD 08/22/2015 8:18 AM

## 2015-08-23 ENCOUNTER — Ambulatory Visit
Admit: 2015-08-23 | Discharge: 2015-08-23 | Disposition: A | Discharge status: Home / Self Care | Payer: Medicare Other | Attending: Radiation Oncology | Admitting: Radiation Oncology

## 2015-08-23 ENCOUNTER — Encounter (HOSPITAL_COMMUNITY): Payer: Self-pay | Admitting: Orthopedic Surgery

## 2015-08-23 ENCOUNTER — Ambulatory Visit: Payer: Medicare Other

## 2015-08-23 ENCOUNTER — Ambulatory Visit: Admission: RE | Admit: 2015-08-23 | Payer: Medicare Other | Source: Ambulatory Visit | Admitting: Radiation Oncology

## 2015-08-23 DIAGNOSIS — Z515 Encounter for palliative care: Secondary | ICD-10-CM

## 2015-08-23 LAB — BASIC METABOLIC PANEL
Anion gap: 10 (ref 5–15)
BUN: 13 mg/dL (ref 6–20)
CALCIUM: 8.6 mg/dL — AB (ref 8.9–10.3)
CHLORIDE: 104 mmol/L (ref 101–111)
CO2: 24 mmol/L (ref 22–32)
CREATININE: 0.73 mg/dL (ref 0.44–1.00)
Glucose, Bld: 127 mg/dL — ABNORMAL HIGH (ref 65–99)
Potassium: 4 mmol/L (ref 3.5–5.1)
SODIUM: 138 mmol/L (ref 135–145)

## 2015-08-23 LAB — CBC
HCT: 36.2 % (ref 36.0–46.0)
Hemoglobin: 12 g/dL (ref 12.0–15.0)
MCH: 31 pg (ref 26.0–34.0)
MCHC: 33.1 g/dL (ref 30.0–36.0)
MCV: 93.5 fL (ref 78.0–100.0)
PLATELETS: 141 10*3/uL — AB (ref 150–400)
RBC: 3.87 MIL/uL (ref 3.87–5.11)
RDW: 16.8 % — AB (ref 11.5–15.5)
WBC: 5.6 10*3/uL (ref 4.0–10.5)

## 2015-08-23 MED ORDER — MORPHINE SULFATE 15 MG PO TABS
30.0000 mg | ORAL_TABLET | ORAL | Status: DC | PRN
  Administered 2015-08-23 – 2015-08-24 (×2): 30 mg via ORAL
  Filled 2015-08-23 (×3): qty 2

## 2015-08-23 MED ORDER — SODIUM CHLORIDE 0.9 % IV SOLN
90.0000 mg | Freq: Once | INTRAVENOUS | Status: AC
  Administered 2015-08-23: 90 mg via INTRAVENOUS
  Filled 2015-08-23 (×3): qty 10

## 2015-08-23 MED ORDER — LOSARTAN POTASSIUM 50 MG PO TABS
100.0000 mg | ORAL_TABLET | Freq: Every day | ORAL | Status: DC
  Administered 2015-08-24 – 2015-08-26 (×3): 100 mg via ORAL
  Filled 2015-08-23 (×3): qty 2

## 2015-08-23 MED ORDER — SODIUM CHLORIDE 0.9 % IJ SOLN
10.0000 mL | INTRAMUSCULAR | Status: DC | PRN
Start: 2015-08-23 — End: 2015-08-26
  Administered 2015-08-25: 10 mL
  Filled 2015-08-23: qty 40

## 2015-08-23 NOTE — Care Management Important Message (Signed)
Important Message  Patient Details  Name: Kendra Johns MRN: [id number] Date of Birth: [birth year]   Medicare Important Message Given:  Yes-second notification given    Nathen May 08/23/2015, 12:19 PM

## 2015-08-23 NOTE — Evaluation (Signed)
Physical Therapy Evaluation Patient Details Name: Kendra Johns MRN: [id number] DOB: 1951-03-28 Today's Date: 08/23/2015   History of Present Illness  64 yo female with history of plasmacytoma, radiation, multiple myeloma, stemcell transplant, now recent bone pain L1 and R thigh pain.  Lesion R femur with IM nailing done, now referred to PT.  Clinical Impression  Pt is moving poorly over unmanaged pain but is willing to work.  Nursing in to deal with IV and will have meds managed shortly.  Up in chair for her meal and will continue on with daily therapy until she goes for short stay at SNF.      Follow Up Recommendations SNF    Equipment Recommendations  None recommended by PT (await her discharge from SNF)    Recommendations for Other Services       Precautions / Restrictions Precautions Precautions: Fall Restrictions Weight Bearing Restrictions: Yes RLE Weight Bearing: Weight bearing as tolerated      Mobility  Bed Mobility Overal bed mobility: Needs Assistance Bed Mobility: Supine to Sit     Supine to sit: Min assist;HOB elevated     General bed mobility comments: Min A to position RLE off of bed and used chuck pad to straighten hips to position bil LE feet on floor. Pt able to assist with by scooting hips forwards use bil UE.  Transfers Overall transfer level: Needs assistance Equipment used: Rolling walker (2 wheeled) Transfers: Sit to/from Stand Sit to Stand: Mod assist Stand pivot transfers: Min assist       General transfer comment: pt has limited tolerance for WB on RLE due to lack of meds   Ambulation/Gait             General Gait Details: unable to tolerate  Stairs            Wheelchair Mobility    Modified Rankin (Stroke Patients Only)       Balance Overall balance assessment: Needs assistance Sitting-balance support: Feet supported Sitting balance-Leahy Scale: Good   Postural control: Posterior lean Standing balance support:  Bilateral upper extremity supported Standing balance-Leahy Scale: Poor Standing balance comment: help to control due to tendency to stand on LLE only                             Pertinent Vitals/Pain Pain Assessment: Faces Pain Score: 0-No pain Faces Pain Scale: Hurts whole lot Pain Location: R thigh Pain Descriptors / Indicators: Spasm;Operative site guarding Pain Intervention(s): Limited activity within patient's tolerance;Monitored during session;Repositioned;Patient requesting pain meds-RN notified (RN is not able to give meds due to IV use issue)    Home Living Family/patient expects to be discharged to:: Private residence Living Arrangements: Spouse/significant other;Other relatives Available Help at Discharge: Family;Available PRN/intermittently Type of Home: House Home Access: Stairs to enter Entrance Stairs-Rails: Can reach both Entrance Stairs-Number of Steps: 3 Home Layout: One level Home Equipment: Walker - 4 wheels;Shower seat;Hand held shower head      Prior Function Level of Independence: Independent               Hand Dominance   Dominant Hand: Right    Extremity/Trunk Assessment   Upper Extremity Assessment: Overall WFL for tasks assessed           Lower Extremity Assessment: Defer to PT evaluation      Cervical / Trunk Assessment: Normal  Communication   Communication: No difficulties  Cognition Arousal/Alertness: Awake/alert Behavior  During Therapy: WFL for tasks assessed/performed Overall Cognitive Status: Within Functional Limits for tasks assessed                      General Comments General comments (skin integrity, edema, etc.): Pt is having issues with IV site to get meds, awaiting some relief and did work on standing despite her pain.      Exercises        Assessment/Plan    PT Assessment Patient needs continued PT services  PT Diagnosis Acute pain;Difficulty walking   PT Problem List Decreased  strength;Decreased range of motion;Decreased activity tolerance;Decreased balance;Decreased mobility;Decreased coordination;Decreased skin integrity;Pain  PT Treatment Interventions DME instruction;Gait training;Stair training;Functional mobility training;Therapeutic activities;Therapeutic exercise;Balance training;Neuromuscular re-education;Patient/family education   PT Goals (Current goals can be found in the Care Plan section) Acute Rehab PT Goals Patient Stated Goal: to hurt less and move more PT Goal Formulation: With patient/family Time For Goal Achievement: 09/06/15 Potential to Achieve Goals: Good    Frequency Min 5X/week   Barriers to discharge Inaccessible home environment;Decreased caregiver support stairs to enter house    Co-evaluation               End of Session   Activity Tolerance: Patient tolerated treatment well;Patient limited by pain Patient left: in chair;with call bell/phone within reach;with nursing/sitter in room;with family/visitor present Nurse Communication: Mobility status         Time: 1210-1231 PT Time Calculation (min) (ACUTE ONLY): 21 min   Charges:   PT Evaluation $Initial PT Evaluation Tier I: 1 Procedure PT Treatments $Therapeutic Activity: 8-22 mins   PT G Codes:        Ramond Dial 2015-09-03, 1:27 PM   Mee Hives, PT MS Acute Rehab Dept. Number: [phone] [id number] and [place] [phone]

## 2015-08-23 NOTE — Progress Notes (Signed)
Progress Note   Kendra Johns PQZ:[id number] DOB: 15-Jan-1951 DOA: 08/21/2015 PCP: [name], MD   Brief Narrative:   Kendra Johns is an 64 y.o. 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 Alvy Bimler, who was admitted 08/21/15 with a chief complaint of a 1 month history of worsening right thigh pain resulting in inability to ambulate/bear weight. Plain films done in the ED showed a lytic lesion of the right femur.   Assessment/Plan:    Principal Problem:  Lytic bone lesion of right femur with intractable cancer associated pain - Continue Continue MS Contin 60 mg every 8 hours, MSIR 60 mg every 4 hours when necessary. - Palliative care following for pain control - Continue Decadron 4 mg IV every 6 hours. - Oncologist aware of admission. Radiation oncology consult requested (Dr. Lisbeth Renshaw has seen in past). S/p IM nail 10/20. Refusing SNF. Will ask physical therapy to work with patient again. Change to po meds.  Palliative care has  Ordered pamidronate for pain. Cleared by ortho discharge whenever medically stable  Active Problems:   Tobacco Abuse - Nicotine patch ordered.  Counseled.   Essential hypertension  uncontrolled. Increase Cozaar.    Hypothyroidism - Continue Synthroid.   Vitamin D deficiency - Continue vitamin D supplementation.   Multiple myeloma not having achieved remission (HCC)/ Status post bone marrow transplant ([place])  progressing despite therapy.   Thrombocytopenia due to drugs - Platelet count mildly reduced. Monitor.   DVT  prophylaxis - Lovenox ordered.  Code Status: Full. Family Communication:  Patient is lucid. Disposition Plan: Home with home health when pain controlled   IV Access:    Peripheral IV   Procedures and diagnostic studies:   Pelvis Portable  08/22/2015  CLINICAL DATA:  Right Hip fracture requiring operative repair ([place]), post op EXAM: PORTABLE PELVIS 1-2 VIEWS COMPARISON:  08/22/2015 FINDINGS: Status post right IM nail/lag screw fixation of right sub trochanteric pathologic hip fracture. Femoral head appears located in the acetabulum. Previous vertebroplasty. IMPRESSION: Status post ORIF right hip. Electronically Signed   By: Nolon Nations M.D.   On: 08/22/2015 17:13   Dg C-arm 1-60 Min  08/22/2015  CLINICAL DATA:  Multiple myeloma, with a pathologic fracture. RIGHT leg pain. EXAM: RIGHT FEMUR 2 VIEWS; DG C-ARM 61-120 MIN COMPARISON:  Plain films most recent 08/21/2015. FINDINGS: Intra medullary rod with compression screw has been placed across the proximal femoral subtrochanteric pathologic fracture. Satisfactory position and alignment. IMPRESSION: As above. Electronically Signed   By: Staci Righter M.D.   On: 08/22/2015 16:31   Dg Femur 1v Right  08/21/2015  CLINICAL DATA:  Multiple myeloma, known RIGHT femoral tumor, increased pain for 1 day EXAM: RIGHT FEMUR 1 VIEW COMPARISON:  08/14/2015 FINDINGS: Osseous demineralization. Poorly defined lytic lesions are identified at the proximal and proximal/mid RIGHT femoral diaphysis compatible with multiple myeloma. Endosteal cortical thinning at the larger more proximal femoral lytic lesion. A small amount of periosteal reaction is identified adjacent to the more cranial of the proximal femoral lesions, located in the subtrochanteric region. Additionally, subtle linear cortical lucency is seen in the medial cortex of the  proximal RIGHT femur inferior to the lesser trochanter likely representing a developing pathologic fracture. Tiny rounded lytic  lesion of the distal RIGHT femoral metaphysis is noted. Knee and hip joint alignments normal. No additional fracture, dislocation or bone destruction. IMPRESSION: Lytic lesions within the RIGHT femoral diaphysis compatible with multiple myeloma. Periosteal reaction and subtle cortical linear lucency at thinned medial cortex of the proximal RIGHT femoral diaphysis inferior to the lesser trochanter likely representing a developing pathologic fracture. Electronically Signed   By: Lavonia Dana M.D.   On: 08/21/2015 16:45   Dg Femur, Min 2 Views Right  08/22/2015  CLINICAL DATA:  Multiple myeloma, with a pathologic fracture. RIGHT leg pain. EXAM: RIGHT FEMUR 2 VIEWS; DG C-ARM 61-120 MIN COMPARISON:  Plain films most recent 08/21/2015. FINDINGS: Intra medullary rod with compression screw has been placed across the proximal femoral subtrochanteric pathologic fracture. Satisfactory position and alignment. IMPRESSION: As above. Electronically Signed   By: Staci Righter M.D.   On: 08/22/2015 16:31     Medical Consultants:     orthopedics  Palliative care    oncology  Anti-Infectives:   Anti-infectives    Start     Dose/Rate Route Frequency Ordered Stop   08/22/15 2200  ceFAZolin (ANCEF) IVPB 2 g/50 mL premix     2 g 100 mL/hr over 30 Minutes Intravenous Every 6 hours 08/22/15 1855 08/23/15 0426   08/22/15 1145  ceFAZolin (ANCEF) IVPB 2 g/50 mL premix  Status:  Discontinued     2 g 100 mL/hr over 30 Minutes Intravenous On call to O.R. 08/22/15 1144 08/22/15 1352      Subjective:    refusing skilled nursing facility. Still having pain.  No problems with constipation.  Objective:    Filed Vitals:   08/23/15 0229 08/23/15 0231 08/23/15 0539 08/23/15 1300  BP: 169/101 193/97 183/93   Pulse: 78  72 71  Temp: 98.1 F (36.7 C)  98.5 F (36.9 C) 98.7 F (37.1 C)  TempSrc: Oral  Oral Oral  Resp: _0 Height:      Weight:      SpO2: 96%  96%     Intake/Output Summary (Last 24  hours) at 08/23/15 1431 Last data filed at 08/23/15 1300  Gross per 24 hour  Intake   1690 ml  Output    500 ml  Net   1190 ml   Filed Weights   08/21/15 1851 08/22/15 1836  Weight: 66.497 kg (146 lb 9.6 oz) 67.3 kg (148 lb 5.9 oz)    Exam: Gen:  NAD Cardiovascular:  RRR, No M/R/G Respiratory:  Lungs CTAB Gastrointestinal:  Abdomen soft, NT/ND, + BS Extremities:  No C/E/C   Data Reviewed:    Labs: Basic Metabolic Panel:  Recent Labs Lab 08/21/15 1711 08/22/15 2002 08/23/15 0450  NA 140  --  138  K 3.9  --  4.0  CL 107  --  104  CO2 25  --  24  GLUCOSE 120*  --  127*  BUN 14  --  13  CREATININE 0.69 0.76 0.73  CALCIUM 9.2  --  8.6*   GFR Estimated Creatinine Clearance: 66.1 mL/min (by C-G formula based on Cr of 0.73). Liver Function Tests: No results for input(s): AST, ALT, ALKPHOS, BILITOT, PROT, ALBUMIN in the last 168 hours. No results for input(s): LIPASE, AMYLASE in the last 168 hours. No results for input(s): AMMONIA in the last 168 hours. Coagulation profile No results for input(s): INR, PROTIME  in the last 168 hours.  CBC:  Recent Labs Lab 08/21/15 1711 08/22/15 2002 08/23/15 0450  WBC 5.9 6.6 5.6  NEUTROABS 4.0  --   --   HGB 13.2 13.0 12.0  HCT 39.1 39.1 36.2  MCV 93.8 94.4 93.5  PLT 146* 136* 141*   Microbiology Recent Results (from the past 240 hour(s))  Surgical pcr screen     Status: None   Collection Time: 08/22/15 12:08 PM  Result Value Ref Range Status   MRSA, PCR NEGATIVE NEGATIVE Final   Staphylococcus aureus NEGATIVE NEGATIVE Final    Comment:        The Xpert SA Assay (FDA approved for NASAL specimens in patients over 17 years of age), is one component of a comprehensive surveillance program.  Test performance has been validated by [facility] for patients greater than or equal to 45 year old. It is not intended to diagnose infection nor to guide or monitor treatment.      Medications:   . aspirin  81 mg Oral  Daily  . cholecalciferol  1,000 Units Oral Daily  . dexamethasone  4 mg Intravenous 4 times per day  . enoxaparin (LOVENOX) injection  40 mg Subcutaneous Q24H  . levothyroxine  50 mcg Oral QAC breakfast  . losartan  50 mg Oral Daily  . morphine  60 mg Oral 3 times per day  . nicotine  14 mg Transdermal Daily  . pamidronate  90 mg Intravenous Once  . senna  1 tablet Oral BID  . sodium chloride  3 mL Intravenous Q12H   Continuous Infusions: . lactated ringers 10 mL/hr at 08/22/15 1322    Time spent: 25 min   LOS: 2 days   [facility]  [email or web address], password [facility]  08/23/2015, 2:31 PM

## 2015-08-23 NOTE — Progress Notes (Signed)
     Subjective:  POD#1 IM nail of the R hip for pathologic fracture. Patient reports pain as moderate.  Resting comfortably in bed this morning.  Will see how she mobilizes with PT today. Did discuss with the patient that she may require a short stay in rehab to improve mobilization.  Objective:   VITALS:   Filed Vitals:   08/22/15 2145 08/23/15 0229 08/23/15 0231 08/23/15 0539  BP: 180/96 169/101 193/97 183/93  Pulse: 70 78  72  Temp: 98.2 F (36.8 C) 98.1 F (36.7 C)  98.5 F (36.9 C)  TempSrc: Oral Oral  Oral  Resp: $Remo'16 16  18  'PvAmp$ Height:      Weight:      SpO2: 95% 96%  96%    Neurologically intact ABD soft Neurovascular intact Sensation intact distally Intact pulses distally Dorsiflexion/Plantar flexion intact Incision: dressing C/D/I   Lab Results  Component Value Date   WBC 5.6 08/23/2015   HGB 12.0 08/23/2015   HCT 36.2 08/23/2015   MCV 93.5 08/23/2015   PLT 141* 08/23/2015   BMET    Component Value Date/Time   NA 138 08/23/2015 0450   NA 140 08/07/2015 0932   K 4.0 08/23/2015 0450   K 3.3* 08/07/2015 0932   CL 104 08/23/2015 0450   CL 107 03/10/2013 1014   CO2 24 08/23/2015 0450   CO2 23 08/07/2015 0932   GLUCOSE 127* 08/23/2015 0450   GLUCOSE 106 08/07/2015 0932   GLUCOSE 111* 03/10/2013 1014   GLUCOSE 98 08/31/2006 1024   BUN 13 08/23/2015 0450   BUN 11.8 08/07/2015 0932   CREATININE 0.73 08/23/2015 0450   CREATININE 0.8 08/07/2015 0932   CALCIUM 8.6* 08/23/2015 0450   CALCIUM 9.1 08/07/2015 0932   GFRNONAA >60 08/23/2015 0450   GFRAA >60 08/23/2015 0450     Assessment/Plan: 1 Day Post-Op   Principal Problem:   Lytic bone lesion of right femur Active Problems:   Essential hypertension   Status post bone marrow transplant ([place])   Thrombocytopenia due to drugs   Vitamin D deficiency   Multiple myeloma not having achieved remission (HCC)   Intractable pain   Cancer associated pain   Hypothyroidism   Tobacco abuse   Encounter for  palliative care   Up with therapy WBAT in the RLE Lovenox for DVT prophylaxis   Kendra Johns 08/23/2015, 6:53 AM Cell [phone]

## 2015-08-23 NOTE — Evaluation (Signed)
Occupational Therapy Evaluation Patient Details Name: Kendra Johns MRN: [id number] DOB: 1951-01-17 Today's Date: 08/23/2015    History of Present Illness   64 y.o. Female s/p intramedullary nail femoral. PMH: HTN, HEP C, depression, diverticulosis of colon, multiple myeloma, HEP B virus infection   Clinical Impression   Pt admitted to hospital due to reason stated above. Pt currently with functional limitiations due to the deficits listed below (see OT problem list). Prior to admission pt was independent with ADLs. Pt currently requires set up to maximal assistance for safety and ADLs. Pt will benefit from skilled OT to increase her independence and safety with ADLs and balance to allow discharge to venue listed below.    Follow Up Recommendations  SNF    Equipment Recommendations  3 in 1 bedside comode    Recommendations for Other Services       Precautions / Restrictions Precautions Precautions: Fall Restrictions Weight Bearing Restrictions: Yes RLE Weight Bearing: Weight bearing as tolerated      Mobility Bed Mobility Overal bed mobility: Needs Assistance Bed Mobility: Supine to Sit     Supine to sit: Min assist;HOB elevated     General bed mobility comments: Min A to position RLE off of bed and used chuck pad to straighten hips to position bil LE feet on floor. Pt able to assist with by scooting hips forwards use bil UE.  Transfers Overall transfer level: Needs assistance Equipment used: Rolling walker (2 wheeled) Transfers: Sit to/from [facility] Sit to Stand: Mod assist Stand pivot transfers: Min assist       General transfer comment: increase time and mod assist for sit to stand transfer. Pt instructed in extended RLE out prior to standing to help with pain. Pt able to pivot on LLE to sit in chair    Balance Overall balance assessment: Needs assistance Sitting-balance support: No upper extremity supported;Feet supported Sitting  balance-Leahy Scale: Fair     Standing balance support: Bilateral upper extremity supported Standing balance-Leahy Scale: Poor Standing balance comment: requires UE support                            ADL Overall ADL's : Needs assistance/impaired Eating/Feeding: Independent;Sitting   Grooming: Wash/dry face;Oral care;Applying deodorant;Set up;Sitting   Upper Body Bathing: Minimal assitance;Sitting   Lower Body Bathing: Maximal assistance;Sitting/lateral leans Lower Body Bathing Details (indicate cue type and reason): pt grandaughter provided max A for LB bathing. Verbal cues for safety regarding care and bathing around incision site Upper Body Dressing : Minimal assistance;Sitting   Lower Body Dressing: Maximal assistance;Sitting/lateral leans;Sit to/from stand   Toilet Transfer: Moderate assistance;BSC   Toileting- Clothing Manipulation and Hygiene: Maximal assistance;Sitting/lateral lean               Vision     Perception     Praxis      Pertinent Vitals/Pain Pain Assessment: No/denies pain Pain Score: 0-No pain     Hand Dominance Right   Extremity/Trunk Assessment Upper Extremity Assessment Upper Extremity Assessment: Overall WFL for task assessed   Lower Extremity Assessment Lower Extremity Assessment: Defer to PT evaluation   Cervical / Trunk Assessment Cervical / Trunk Assessment: Normal   Communication Communication Communication: No difficulties   Cognition Arousal/Alertness: Awake/alert Behavior During Therapy: WFL for tasks assessed/performed Overall Cognitive Status: Within Functional Limits for tasks assessed  General Comments       Exercises       Shoulder Instructions      Home Living Family/patient expects to be discharged to:: Private residence Living Arrangements: Spouse/significant other;Other relatives Available Help at Discharge: Family;Available PRN/intermittently Type of Home:  House Home Access: Stairs to enter [employer] of Steps: 3 Entrance Stairs-Rails: Can reach both Home Layout: One level     Bathroom Shower/Tub: Occupational psychologist: Standard Bathroom Accessibility: Yes   Home Equipment: Environmental consultant - 4 wheels;Shower seat;Hand held shower head          Prior Functioning/Environment Level of Independence: Independent             OT Diagnosis: Generalized weakness;Acute pain   OT Problem List: Decreased strength;Decreased activity tolerance;Impaired balance (sitting and/or standing);Decreased safety awareness;Decreased knowledge of use of DME or AE;Pain   OT Treatment/Interventions: Self-care/ADL training;Therapeutic exercise;DME and/or AE instruction;Patient/family education;Balance training;Therapeutic activities    OT Goals(Current goals can be found in the care plan section) Acute Rehab OT Goals Patient Stated Goal: to move better OT Goal Formulation: With patient Time For Goal Achievement: 09/06/15 Potential to Achieve Goals: Good ADL Goals Pt Will Perform Grooming: with supervision;standing Pt Will Perform Upper Body Bathing: with supervision;with adaptive equipment;sitting Pt Will Perform Lower Body Bathing: with min assist;with adaptive equipment;sitting/lateral leans;sit to/from stand Pt Will Perform Upper Body Dressing: with supervision;sitting Pt Will Perform Lower Body Dressing: with min assist;with adaptive equipment;sitting/lateral leans;sit to/from stand Pt Will Transfer to Toilet: with min assist;ambulating;bedside commode Pt Will Perform Toileting - Clothing Manipulation and hygiene: with min assist;sitting/lateral leans;sit to/from stand Pt Will Perform Tub/Shower Transfer: Shower transfer;with supervision;ambulating;rolling walker;shower seat  OT Frequency: Min 2X/week   Barriers to D/C:            Co-evaluation              End of Session Equipment Utilized During Treatment: Gait  belt;Rolling walker  Activity Tolerance: Patient tolerated treatment well;Patient limited by pain Patient left: in chair;with call bell/phone within reach;with family/visitor present   Time: 3151-7616 OT Time Calculation (min): 38 min Charges:  OT General Charges $OT Visit: 1 Procedure OT Evaluation $Initial OT Evaluation Tier I: 1 Procedure OT Treatments $Self Care/Home Management : 8-22 mins G-Codes:    Lin Landsman 22-Sep-2015, 12:58 PM

## 2015-08-23 NOTE — Consult Note (Signed)
Consultation Note Date: 08/23/2015   Patient Name: Kendra Johns  DOB: 21-Oct-1951  MRN: [id number]  Age / Sex: 64 y.o., female  PCP: Marin Olp, MD Referring Physician: Delfina Redwood, MD  Reason for Consultation: Pain control and Psychosocial/spiritual support    Clinical Assessment/Narrative:  64 y.o. female  seen in the ED for uncontrolled pain of the R leg and inability to ambulate. She has a hx of multiple myeloma (2008) and is actively receiving chemotherapy at this time. The patient also had a stem cell transplant in 2009. She relapsed in 2012 and had a bone marrow transplant in 2013. The patient is followed by Dr. Gorsuch/oncology. Recent MRI of the L spine show progressive L1 vertebral disease. The patient reports that the R thigh pain has been gradually worsening for the past month. Xrays in the ED revealed pathologic fracture of the R femur.  Surgical repair of right femur fx-08-22-15 Currently receiving radiation under care of Dr Lisbeth Renshaw, completion Nov 3  This NP Wadie Lessen reviewed medical records, received report from team, assessed the patient and then meet at the patient's bedside  to discuss symptom management strategies specific to her cancer related pain.  Introduced Teacher, adult education of palliative medicine as part of a holistic care plan.   Values and goals of care important to patient and family were attempted to be elicited.  Opened opportunity for discuss the importance of discussion and documentation of advanced directives and HPOA.  Patient shared with me that she doesn't really know why conversation has not come up between herself and her family, especially since her husband is a Dietitian.  Made patient aware that I would be happy to meet with her in the outpatient radiation-oncology  setting for further discussion if that would be helpful to her and her family  Questions and concerns  addressed.    PMT will continue to support holistically.   Primary Decision Maker: self   HCPOA: no    SUMMARY OF RECOMMENDATIONS:  Medications for acute/chronic bone pain 2/2 to MM, pathologic fracture and surgery.   At this time continue current (OxyContin 60 mg every 8 hrs) as previously prescribed by Dr Philips/pain clinic. I spoke with Dr Hardin Negus and he appreciates notification of Ms Marcucci's care and agrees to  continue to follow for pain management once discharged.  Convert IV prn medications to oral agents to facilitate discharge home. Utilize Oxycodone 30 mg po every 3 hrs prn.   Reassess for efficacy, may need to increase ER dose depending on prn needs.   I suspect  need will decrease over the next 24-48 hrs.  Pamidronate 90 mg IV one time dose for bone metastasis 2/2 to multiple myelonma  (50-70% achieve 30 % pain reduction in one week)  Discussed incorporating alternatives practices into pain management;   deep breathing, prayer, music.  Ms Mountz tells me prayer is a coping mechanism for her.  Follow-up with Dr Lisbeth Renshaw to  resume radiation treatments and with Dr Alvy Bimler for chemotherapy        Code Status/Advance Care Planning: Full code -discussed importance of documentation of AD    Code Status Orders        Start     Ordered   08/22/15 1856  Full code   Continuous     08/22/15 1855    Advance Directive Documentation        Most Recent Value   Type of Advance Directive  Healthcare Power of [place]  out of facility DNR order (yellow form or pink MOST form)     "MOST" Form in Place?         Symptom Management:   Pain: Acute and chronic bone pain OxyContin 60 mg every 8 hrs Oxycodone 30 mg po every 3 hrs prn     Palliative Prophylaxis:    Bowel Regimen-patient use MOM successfully for management of constipation   Psycho-social/Spiritual:   Support System: Strong   Discharge Planning: Home with Home Health-patient is refusing  SNF for rehabilitation   Chief Complaint/ Primary Diagnoses: Present on Admission:  . Intractable pain . Cancer associated pain . Multiple myeloma not having achieved remission ([place]) . Essential hypertension . Vitamin D deficiency . Lytic bone lesion of right femur . Hypothyroidism . Thrombocytopenia due to drugs . Tobacco abuse  I have reviewed the medical record, interviewed the patient and family, and examined the patient. The following aspects are pertinent.  Past Medical History  Diagnosis Date  . Depression   . Hypertension   . Hepatitis C     genotype 1  . Diverticulosis of colon   . Multiple myeloma     kappa light chain, s/p high-dose chemo/stem cell tx - [facility]  . Hx of adenomatous colonic polyps 2007  . Anxiety   . Osteoarthritis   . Sleep apnea   . Hyperlipidemia   . GERD with stricture   . External hemorrhoids   . Hepatitis B virus infection   . IBS (irritable bowel syndrome)   . Helicobacter pylori gastritis 2001    ? if treated  . Sciatic nerve pain     with Dr. Earle Gell  . Chronic low back pain   . Unspecified vitamin D deficiency 09/22/2013  . Allergy   . Fever 11/28/2013  . Dehydration 11/28/2013  . Nausea alone 01/22/2014  . Cough 08/02/2014  . DIVERTICULOSIS, COLON 08/31/2006  . DEPRESSION 08/31/2006    Qualifier: Diagnosis of  By: [name], [name]    . Muscle cramp 11/21/2014  . Chronic fatigue 03/07/2015  . Vitamin D deficiency 05/28/2015  . S/P radiation therapy 06/05/15-06/18/15    C7-T1    Social History   Social History  . Marital Status: Married    Spouse Name: Olivia Pavelko.  . Number of Children: 2  . Years of Education: N/A   Occupational History  . Security    Social History Main Topics  . Smoking status: Current Every Day Smoker -- 0.25 packs/day for 44 years    Types: Cigarettes  . Smokeless tobacco: Never Used  . Alcohol Use: No  . Drug Use: No  . Sexual Activity: No   Other Topics Concern  . None   Social History  Narrative   Lives with husband and grandson.  Ambulates with a walker at baseline.   Family History  Problem Relation Age of Onset  . Alcohol abuse    . Lung cancer Father     smoked  . Leukemia Mother   . Cirrhosis Sister   . Colon cancer Neg Hx   . Lung cancer Sister     smoked   Scheduled Meds: . aspirin  81 mg Oral Daily  . cholecalciferol  1,000 Units Oral Daily  . dexamethasone  4 mg Intravenous 4 times per day  . enoxaparin (LOVENOX) injection  40 mg Subcutaneous Q24H  . levothyroxine  50 mcg Oral QAC breakfast  . losartan  50 mg Oral Daily  . morphine  60 mg  Oral 3 times per day  . nicotine  14 mg Transdermal Daily  . pamidronate  90 mg Intravenous Once  . senna  1 tablet Oral BID  . sodium chloride  3 mL Intravenous Q12H   Continuous Infusions: . lactated ringers 10 mL/hr at 08/22/15 1322   PRN Meds:.sodium chloride, acetaminophen **OR** acetaminophen, alum & mag hydroxide-simeth, cyclobenzaprine, HYDROmorphone (DILAUDID) injection, HYDROmorphone (DILAUDID) injection, HYDROmorphone (DILAUDID) injection, HYDROmorphone (DILAUDID) injection, ipratropium, magic mouthwash, magnesium hydroxide, menthol-cetylpyridinium **OR** phenol, metoCLOPramide **OR** metoCLOPramide (REGLAN) injection, morphine, ondansetron **OR** ondansetron (ZOFRAN) IV, promethazine, sodium chloride, sodium chloride, sucralfate, zolpidem Medications Prior to Admission:  Prior to Admission medications   Medication Sig Start Date End Date Taking? Authorizing Provider  aspirin 81 MG tablet Take 81 mg by mouth daily.   Yes Historical Provider, MD  Cholecalciferol (VITAMIN D3) 5000 UNITS TABS Take 5,000 mg by mouth every morning. 12/05/14  Yes Heath Lark, MD  cyclobenzaprine (FLEXERIL) 10 MG tablet Take 1 tablet (10 mg total) by mouth 3 (three) times daily as needed for muscle spasms. 07/10/15  Yes Heath Lark, MD  dexamethasone (DECADRON) 4 MG tablet TAKE 1 TABLET (4 MG TOTAL) BY MOUTH DAILY. 08/14/15  Yes Heath Lark, MD  ergocalciferol (VITAMIN D2) 50000 UNITS capsule Take 1 capsule (50,000 Units total) by mouth once a week. 05/28/15  Yes Heath Lark, MD  ipratropium (ATROVENT HFA) 17 MCG/ACT inhaler Inhale 2 puffs into the lungs every 4 (four) hours as needed for wheezing. 09/13/14  Yes Heath Lark, MD  lenalidomide (REVLIMID) 25 MG capsule TAKE ONE CAPSULE BY MOUTH EVERY DAY FOR 21 DAYS THEN 7 DAYS OFF. 08/01/15  Yes Heath Lark, MD  levothyroxine (SYNTHROID) 50 MCG tablet Take 1 tablet (50 mcg total) by mouth daily before breakfast. 04/17/15  Yes Heath Lark, MD  lidocaine-prilocaine (EMLA) cream Apply 1 application topically as needed. Patient taking differently: Apply 1 application topically daily as needed (for port).  05/28/15  Yes Heath Lark, MD  losartan (COZAAR) 50 MG tablet TAKE 1/2 TO 1 TABLET DAILY Patient taking differently: Take 1 tablet by mouth daily 07/23/15  Yes Marin Olp, MD  magic mouthwash SOLN Take 10 mLs by mouth 4 (four) times daily as needed for mouth pain. Swish and spit  Or  Swish and swallow. 07/10/15  Yes Heath Lark, MD  magnesium hydroxide (MILK OF MAGNESIA) 400 MG/5ML suspension Take 30 mLs by mouth daily as needed for mild constipation (constipation).    Yes Historical Provider, MD  morphine (MS CONTIN) 60 MG 12 hr tablet Take 60 mg by mouth every 8 (eight) hours.  05/25/15  Yes Historical Provider, MD  morphine (MSIR) 30 MG tablet Take 30 mg by mouth every 4 (four) hours as needed for severe pain (pain).    Yes Historical Provider, MD  PRESCRIPTION MEDICATION Chemo at [facility] every other week   Yes Historical Provider, MD  prochlorperazine (COMPAZINE) 10 MG tablet Take 1 tablet (10 mg total) by mouth every 6 (six) hours as needed for nausea. 07/10/15  Yes Heath Lark, MD  sucralfate (CARAFATE) 1 G tablet Take 1 tablet (1 g total) by mouth 4 (four) times daily. Patient taking differently: Take 1 g by mouth 4 (four) times daily as needed (stomach ulcers/ pain).  06/17/15  Yes Kyung Rudd, MD  enoxaparin (LOVENOX) 40 MG/0.4ML injection Inject 0.4 mLs (40 mg total) into the skin daily. 08/22/15   Lovett Calender, PA-C   Allergies  Allergen Reactions  . Codeine Itching  .  Ibuprofen Other (See Comments)    REACTION: gi trouble  . Albuterol Hives    Pt tolerated albuterol neb without issue 12/2014   CBC:    Component Value Date/Time   WBC 5.6 08/23/2015 0450   WBC 6.5 08/07/2015 0931   HGB 12.0 08/23/2015 0450   HGB 14.1 08/07/2015 0931   HCT 36.2 08/23/2015 0450   HCT 41.6 08/07/2015 0931   PLT 141* 08/23/2015 0450   PLT 162 08/07/2015 0931   MCV 93.5 08/23/2015 0450   MCV 92.4 08/07/2015 0931   NEUTROABS 4.0 08/21/2015 1711   NEUTROABS 3.9 08/07/2015 0931   LYMPHSABS 0.7 08/21/2015 1711   LYMPHSABS 1.4 08/07/2015 0931   MONOABS 1.0 08/21/2015 1711   MONOABS 0.8 08/07/2015 0931   EOSABS 0.1 08/21/2015 1711   EOSABS 0.4 08/07/2015 0931   BASOSABS 0.0 08/21/2015 1711   BASOSABS 0.1 08/07/2015 0931   Comprehensive Metabolic Panel:    Component Value Date/Time   NA 138 08/23/2015 0450   NA 140 08/07/2015 0932   K 4.0 08/23/2015 0450   K 3.3* 08/07/2015 0932   CL 104 08/23/2015 0450   CL 107 03/10/2013 1014   CO2 24 08/23/2015 0450   CO2 23 08/07/2015 0932   BUN 13 08/23/2015 0450   BUN 11.8 08/07/2015 0932   CREATININE 0.73 08/23/2015 0450   CREATININE 0.8 08/07/2015 0932   GLUCOSE 127* 08/23/2015 0450   GLUCOSE 106 08/07/2015 0932   GLUCOSE 111* 03/10/2013 1014   GLUCOSE 98 08/31/2006 1024   CALCIUM 8.6* 08/23/2015 0450   CALCIUM 9.1 08/07/2015 0932   AST 12 08/07/2015 0932   AST 18 09/11/2014 0336   ALT 13 08/07/2015 0932   ALT 18 09/11/2014 0336   ALKPHOS 100 08/07/2015 0932   ALKPHOS 113 09/11/2014 0336   BILITOT 0.39 08/07/2015 0932   BILITOT 0.6 09/11/2014 0336   PROT 6.2* 08/07/2015 0932   PROT 6.6 09/11/2014 0336   ALBUMIN 3.6 08/07/2015 0932   ALBUMIN 3.4* 09/11/2014 0336    Review of Systems  Constitutional: Positive for  activity change.       Decreased mobility 2/2 to pathologic fx  HENT: Negative.   Eyes: Negative.   Respiratory: Negative.   Cardiovascular: Negative.   Endocrine: Negative.   Genitourinary: Negative.   Musculoskeletal:       -cancer associated pain   Allergic/Immunologic: Negative.   Neurological: Negative.   Hematological: Negative.   Psychiatric/Behavioral: Negative.     Physical Exam  Constitutional: She is oriented to person, place, and time. She appears well-developed and well-nourished.  HENT:  Head: Normocephalic.  Mouth/Throat: Oropharynx is clear and moist.  Cardiovascular: Normal rate, regular rhythm and normal heart sounds.   Respiratory: Effort normal and breath sounds normal.  GI: Soft. Bowel sounds are normal.  Musculoskeletal:  Right thigh pain 2/2 to pathologic fx and repain  Neurological: She is alert and oriented to person, place, and time.  Skin: Skin is warm and dry.    Vital Signs: BP 183/93 mmHg  Pulse 71  Temp(Src) 98.7 F (37.1 C) (Oral)  Resp 18  Ht $R'5\' 4"'zL$  (1.626 m)  Wt 67.3 kg (148 lb 5.9 oz)  BMI 25.46 kg/m2  SpO2 96% SpO2: Last BM Date: 08/20/15  O2 Device:SpO2: 96 % O2 Flow Rate: .O2 Flow Rate (L/min): 2 L/min Intake/output summary:  Intake/Output Summary (Last 24 hours) at 08/23/15 1359 Last data filed at 08/23/15 1300  Gross per 24 hour  Intake   1690 ml  Output    500 ml  Net   1190 ml   LBM:  BMP Latest Ref Rng 08/23/2015 08/22/2015 08/21/2015  Glucose 65 - 99 mg/dL 127(H) - 120(H)  BUN 6 - 20 mg/dL 13 - 14  Creatinine 0.44 - 1.00 mg/dL 0.73 0.76 0.69  Sodium 135 - 145 mmol/L 138 - 140  Potassium 3.5 - 5.1 mmol/L 4.0 - 3.9  Chloride 101 - 111 mmol/L 104 - 107  CO2 22 - 32 mmol/L 24 - 25  Calcium 8.9 - 10.3 mg/dL 8.6(L) - 9.2    Baseline Weight: Weight: 66.497 kg (146 lb 9.6 oz) Most recent weight: Weight: 67.3 kg (148 lb 5.9 oz)      Palliative Assessment/Data:  Flowsheet Rows        Most Recent Value   Intake Tab      Referral Department  Hospitalist   Unit at Time of Referral  Oncology Unit   Palliative Care Primary Diagnosis  Cancer   Date Notified  08/21/15   Palliative Care Type  New Palliative care   Reason for referral  Pain   Date of Admission  08/21/15   Date first seen by Palliative Care  08/22/15   # of days Palliative referral response time  1 Day(s)   # of days IP prior to Palliative referral  0   Clinical Assessment    Psychosocial & Spiritual Assessment    Palliative Care Outcomes       Additional Data Reviewed: Recent Labs     08/21/15  1711  08/22/15  2002  08/23/15  0450  WBC  5.9  6.6  5.6  HGB  13.2  13.0  12.0  PLT  146*  136*  141*  NA  140   --   138  BUN  14   --   13  CREATININE  0.69  0.76  0.73    Time In: 1300 Time Out: 1420 Time Total: 80 min Greater than 50%  of this time was spent counseling and coordinating care related to the above assessment and plan.  Discussed with Dr Conley Canal, Dr Hardin Negus   Signed by: Wadie Lessen, NP  Knox Royalty, NP  08/23/2015, 1:59 PM  Please contact Palliative Medicine Team phone at [phone] for questions and concerns.

## 2015-08-23 NOTE — Clinical Social Work Note (Addendum)
CSW met with patient to discuss going to a SNF for short term rehab, patient expressed that she would rather not go to a SNF, but is open to having home health see patient.  Patient expressed that if she went to a facility she would not be willing to participate in therapy, and would rather just do exercises and therapy at home.  CSW to notify physician and case manager, physician and case manager made aware CSW to sign off please reconsult if other social work needs arise.  Kendra Johns. [place], MSW, [place] 08/23/2015 1:43 PM

## 2015-08-24 LAB — GLUCOSE, CAPILLARY
GLUCOSE-CAPILLARY: 154 mg/dL — AB (ref 65–99)
GLUCOSE-CAPILLARY: 155 mg/dL — AB (ref 65–99)

## 2015-08-24 MED ORDER — MORPHINE SULFATE 15 MG PO TABS
30.0000 mg | ORAL_TABLET | Freq: Once | ORAL | Status: AC
  Administered 2015-08-24: 30 mg via ORAL

## 2015-08-24 MED ORDER — METHOCARBAMOL 500 MG PO TABS
500.0000 mg | ORAL_TABLET | Freq: Four times a day (QID) | ORAL | Status: DC | PRN
  Administered 2015-08-24 – 2015-08-26 (×2): 500 mg via ORAL
  Filled 2015-08-24 (×2): qty 1

## 2015-08-24 MED ORDER — MORPHINE SULFATE ER 60 MG PO TBCR
75.0000 mg | EXTENDED_RELEASE_TABLET | Freq: Three times a day (TID) | ORAL | Status: DC
  Administered 2015-08-24 – 2015-08-26 (×6): 75 mg via ORAL
  Filled 2015-08-24 (×12): qty 1

## 2015-08-24 MED ORDER — METOPROLOL TARTRATE 50 MG PO TABS
50.0000 mg | ORAL_TABLET | Freq: Two times a day (BID) | ORAL | Status: DC
  Administered 2015-08-24 – 2015-08-25 (×3): 50 mg via ORAL
  Filled 2015-08-24 (×3): qty 1

## 2015-08-24 MED ORDER — MORPHINE SULFATE 15 MG PO TABS: 30.0000 mg | ORAL_TABLET | ORAL | Status: DC | PRN

## 2015-08-24 MED ORDER — MORPHINE SULFATE 15 MG PO TABS
30.0000 mg | ORAL_TABLET | ORAL | Status: DC | PRN
Start: 2015-08-24 — End: 2015-08-26
  Administered 2015-08-24: 30 mg via ORAL
  Administered 2015-08-24: 45 mg via ORAL
  Administered 2015-08-24: 30 mg via ORAL
  Administered 2015-08-24: 15 mg via ORAL
  Administered 2015-08-25 – 2015-08-26 (×6): 45 mg via ORAL
  Filled 2015-08-24: qty 2
  Filled 2015-08-24 (×2): qty 3
  Filled 2015-08-24: qty 2
  Filled 2015-08-24 (×2): qty 3
  Filled 2015-08-24: qty 2
  Filled 2015-08-24 (×3): qty 3

## 2015-08-24 MED ORDER — MORPHINE SULFATE 15 MG PO TABS
15.0000 mg | ORAL_TABLET | Freq: Once | ORAL | Status: AC
  Administered 2015-08-24: 15 mg via ORAL
  Filled 2015-08-24: qty 1

## 2015-08-24 NOTE — Progress Notes (Signed)
Subjective: 2 Days Post-Op Procedure(s) (LRB): INTRAMEDULLARY (IM) NAIL FEMORAL (Right) Patient reports pain as mild.  Patient has increased pain with any movement of the RLE.  No nausea/vomiting, lightheadedness/dizziness, chest pain/sob.  Objective: Vital signs in last 24 hours: Temp:  [97.9 F (36.6 C)-98.8 F (37.1 C)] 98.4 F (36.9 C) (10/22 0923) Pulse Rate:  [71-93] 77 (10/22 0923) Resp:  [16-18] 18 (10/22 0923) BP: (165-175)/(98-101) 175/100 mmHg (10/22 0923) SpO2:  [96 %] 96 % (10/22 0923)  Intake/Output from previous day: 10/21 0701 - 10/22 0700 In: 720 [P.O.:720] Out: 400 [Urine:400] Intake/Output this shift:     Recent Labs  08/21/15 1711 08/22/15 2002 08/23/15 0450  HGB 13.2 13.0 12.0    Recent Labs  08/22/15 2002 08/23/15 0450  WBC 6.6 5.6  RBC 4.14 3.87  HCT 39.1 36.2  PLT 136* 141*    Recent Labs  08/21/15 1711 08/22/15 2002 08/23/15 0450  NA 140  --  138  K 3.9  --  4.0  CL 107  --  104  CO2 25  --  24  BUN 14  --  13  CREATININE 0.69 0.76 0.73  GLUCOSE 120*  --  127*  CALCIUM 9.2  --  8.6*   No results for input(s): LABPT, INR in the last 72 hours.  Neurologically intact Neurovascular intact Sensation intact distally Intact pulses distally Dorsiflexion/Plantar flexion intact Compartment soft  Minimal drainage noted through bandages Negative homans bilaterally  Assessment/Plan: 2 Days Post-Op Procedure(s) (LRB): INTRAMEDULLARY (IM) NAIL FEMORAL (Right) Advance diet Up with therapy  WBAT RLE Continue plan per medicine  Fannie Knee 08/24/2015, 10:27 AM

## 2015-08-24 NOTE — Progress Notes (Signed)
Progress Note   Kendra Johns DOB: [birth year] DOA: 08/21/2015 PCP: [name], MD   Brief Narrative:   Kendra Johns is an 64 y.o. 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 Alvy Bimler, who was admitted 08/21/15 with a chief complaint of a 1 month history of worsening right thigh pain resulting in inability to ambulate/bear weight. Plain films done in the ED showed a lytic lesion of the right femur.   Assessment/Plan:    Principal Problem:  pathologic fx right femur with intractable cancer associated pain Pain poorly controlled. Palliative adjusting medications - Continue Decadron 4 mg IV every 6 hours. S/p IM nail 10/20.  Will ask physical therapy to work with patient again. Change to po meds.  Now agreeable to SNF. Will reconsult SW. Will need transport to XRT from facility  Active Problems:   Tobacco Abuse - Nicotine patch ordered.  Counseled.   Essential hypertension  uncontrolled. Pt reports chronically uncontrolled. Cozaar increased. Add metoprolol   Hypothyroidism - Continue Synthroid.   Vitamin D deficiency - Continue vitamin D supplementation.   Multiple myeloma not having achieved remission ([facility])/ Status post bone marrow transplant ([place])  progressing despite therapy.   Thrombocytopenia due to drugs - Platelet count mildly reduced. Monitor.   DVT prophylaxis - Lovenox ordered.  Code Status: Full. Family Communication:  Multiple at bedside Disposition Plan: SNF when pain controlled   diagnostic studies:    Pelvis Portable  08/22/2015  CLINICAL DATA:  Right Hip fracture requiring operative repair ([place]), post op EXAM: PORTABLE PELVIS 1-2 VIEWS COMPARISON:  08/22/2015 FINDINGS: Status post right IM nail/lag screw fixation of right sub trochanteric pathologic hip fracture. Femoral head appears located in the acetabulum. Previous vertebroplasty. IMPRESSION: Status post ORIF right hip. Electronically Signed   By: Nolon Nations M.D.   On: 08/22/2015 17:13   Dg C-arm 1-60 Min  08/22/2015  CLINICAL DATA:  Multiple myeloma, with a pathologic fracture. RIGHT leg pain. EXAM: RIGHT FEMUR 2 VIEWS; DG C-ARM 61-120 MIN COMPARISON:  Plain films most recent 08/21/2015. FINDINGS: Intra medullary rod with compression screw has been placed across the proximal femoral subtrochanteric pathologic fracture. Satisfactory position and alignment. IMPRESSION: As above. Electronically Signed   By: Staci Righter M.D.   On: 08/22/2015 16:31   Dg Femur, Min 2 Views Right  08/22/2015  CLINICAL DATA:  Multiple myeloma, with a pathologic fracture. RIGHT leg pain. EXAM: RIGHT FEMUR 2 VIEWS; DG C-ARM 61-120 MIN COMPARISON:  Plain films most recent 08/21/2015. FINDINGS: Intra medullary rod with compression screw has been placed across the proximal femoral subtrochanteric pathologic fracture. Satisfactory position and alignment. IMPRESSION: As above. Electronically Signed   By: Staci Righter M.D.   On: 08/22/2015 16:31     Medical Consultants:     orthopedics  Palliative care    oncology  Anti-Infectives:   Anti-infectives    Start     Dose/Rate Route Frequency Ordered Stop   08/22/15 2200  ceFAZolin (ANCEF) IVPB 2 g/50 mL premix     2 g 100 mL/hr over 30 Minutes Intravenous Every 6 hours 08/22/15 1855 08/23/15 0426  08/22/15 1145  ceFAZolin (ANCEF) IVPB 2 g/50 mL premix  Status:  Discontinued     2 g 100 mL/hr over 30 Minutes Intravenous On call to O.R. 08/22/15 1144 08/22/15 1352      Subjective:   Pain poorly  controlled.  Objective:    Filed Vitals:   08/24/15 0611 08/24/15 0923 08/24/15 1029 08/24/15 1242  BP: 174/98 175/100 172/102 156/91  Pulse: 90 77    Temp: 97.9 F (36.6 C) 98.4 F (36.9 C)    TempSrc: Oral Oral    Resp: 16 18    Height:      Weight:      SpO2: 96% 96%      Intake/Output Summary (Last 24 hours) at 08/24/15 1356 Last data filed at 08/24/15 0900  Gross per 24 hour  Intake    480 ml  Output    400 ml  Net     80 ml   Filed Weights   08/21/15 1851 08/22/15 1836  Weight: 66.497 kg (146 lb 9.6 oz) 67.3 kg (148 lb 5.9 oz)    Exam: Gen:  Wincing when trying to move leg Cardiovascular:  RRR, No M/R/G Respiratory:  Lungs CTAB Gastrointestinal:  Abdomen soft, NT/ND, + BS Extremities:  No C/E/C   Data Reviewed:    Labs: Basic Metabolic Panel:  Recent Labs Lab 08/21/15 1711 08/22/15 2002 08/23/15 0450  NA 140  --  138  K 3.9  --  4.0  CL 107  --  104  CO2 25  --  24  GLUCOSE 120*  --  127*  BUN 14  --  13  CREATININE 0.69 0.76 0.73  CALCIUM 9.2  --  8.6*   GFR Estimated Creatinine Clearance: 66.1 mL/min (by C-G formula based on Cr of 0.73). Liver Function Tests: No results for input(s): AST, ALT, ALKPHOS, BILITOT, PROT, ALBUMIN in the last 168 hours. No results for input(s): LIPASE, AMYLASE in the last 168 hours. No results for input(s): AMMONIA in the last 168 hours. Coagulation profile No results for input(s): INR, PROTIME in the last 168 hours.  CBC:  Recent Labs Lab 08/21/15 1711 08/22/15 2002 08/23/15 0450  WBC 5.9 6.6 5.6  NEUTROABS 4.0  --   --   HGB 13.2 13.0 12.0  HCT 39.1 39.1 36.2  MCV 93.8 94.4 93.5  PLT 146* 136* 141*   Microbiology Recent Results (from the past 240 hour(s))  Surgical pcr screen     Status: None   Collection Time: 08/22/15 12:08 PM  Result Value Ref Range Status   MRSA, PCR NEGATIVE NEGATIVE Final   Staphylococcus aureus NEGATIVE NEGATIVE Final    Comment:        The Xpert SA Assay  (FDA approved for NASAL specimens in patients over 78 years of age), is one component of a comprehensive surveillance program.  Test performance has been validated by [facility] for patients greater than or equal to 88 year old. It is not intended to diagnose infection nor to guide or monitor treatment.      Medications:   . aspirin  81 mg Oral Daily  . cholecalciferol  1,000 Units Oral Daily  . dexamethasone  4 mg Intravenous 4 times per day  . enoxaparin (LOVENOX) injection  40 mg Subcutaneous Q24H  . levothyroxine  50 mcg Oral QAC breakfast  . losartan  100 mg Oral Daily  . metoprolol tartrate  50 mg Oral BID  . morphine  75 mg Oral 3 times  per day  . nicotine  14 mg Transdermal Daily  . senna  1 tablet Oral BID  . sodium chloride  3 mL Intravenous Q12H   Continuous Infusions: . lactated ringers 10 mL/hr at 08/22/15 1322    Time spent: 25 min   LOS: 3 days   [place] [facility]  [email or web address], password [email or web address]  08/24/2015, 1:56 PM

## 2015-08-24 NOTE — Progress Notes (Signed)
Occupational Therapy Treatment Patient Details Name: SIMRA FIEBIG MRN: [id number] DOB: 02-Sep-1951 Today's Date: 08/24/2015    History of present illness 64 yo female with history of plasmacytoma, radiation, multiple myeloma, stemcell transplant, now recent bone pain L1 and R thigh pain.  Lesion R femur with IM nailing done, now referred to PT.   OT comments  Pt. Making gains with skilled OT. Able to complete bed mobility and functional stand pivot transfer with min/mod a.  Encouraged oob and use of bsc.  Follow Up Recommendations  SNF    Equipment Recommendations  3 in 1 bedside comode    Recommendations for Other Services      Precautions / Restrictions Precautions Precautions: Fall Restrictions RLE Weight Bearing: Weight bearing as tolerated       Mobility Bed Mobility Overal bed mobility: Needs Assistance Bed Mobility: Supine to Sit     Supine to sit: Supervision     General bed mobility comments: hob flat, pt. able to transition supine to sit with s. increased time but able to guide each le oob with s.  has very high bed reports use of step stool, height of bed adjusted to pts. height to simulate home env.  will need education for back to bed  Transfers Overall transfer level: Needs assistance Equipment used: Rolling walker (2 wheeled) Transfers: Sit to/from Stand;Stand Pivot Transfers Sit to Stand: Min assist;Mod assist Stand pivot transfers: Min assist;Mod assist       General transfer comment: pt has limited tolerance for WB on RLE, max encouragement and inst. of pushing through walker with b ues    Balance                                   ADL Overall ADL's : Needs assistance/impaired     Grooming: Wash/dry hands;Sitting;Set up               Lower Body Dressing: Maximal assistance;Sitting/lateral leans;Sit to/from stand Lower Body Dressing Details (indicate cue type and reason): able to don left sock in long sitting but required  max a for r le sock, states she will have A with lb adls at home Toilet Transfer: Minimal assistance;Stand-pivot;RW Toilet Transfer Details (indicate cue type and reason): simulated during stand pivot transfer eob to recliner, requesting female urinal, encouraged use of bsc she declined but states she will later.   Toileting- Clothing Manipulation and Hygiene: Sitting/lateral lean;Set up Toileting - Clothing Manipulation Details (indicate cue type and reason): simulated during transfers     Functional mobility during ADLs: Minimal assistance;Moderate assistance General ADL Comments: eager to get moving but limited by pain       Vision                     Perception     Praxis      Cognition   Behavior During Therapy: [facility] for tasks assessed/performed Overall Cognitive Status: Within Functional Limits for tasks assessed                       Extremity/Trunk Assessment               Exercises     Shoulder Instructions       General Comments      Pertinent Vitals/ Pain       Pain Assessment: 0-10 Pain Score: 7  Pain Location: r le Pain  Descriptors / Indicators: Aching Pain Intervention(s): Monitored during session;Repositioned;Patient requesting pain meds-RN notified;RN gave pain meds during session  Home Living                                          Prior Functioning/Environment              Frequency Min 2X/week     Progress Toward Goals  OT Goals(current goals can now be found in the care plan section)  Progress towards OT goals: Progressing toward goals     Plan Discharge plan remains appropriate    Co-evaluation                 End of Session Equipment Utilized During Treatment: Gait belt;Rolling walker   Activity Tolerance Patient tolerated treatment well   Patient Left in chair;with call bell/phone within reach   Nurse Communication Other (comment) (spoke with cna requesting bucket for bsc for  use later)        Time: 0945-1005 OT Time Calculation (min): 20 min  Charges: OT General Charges $OT Visit: 1 Procedure OT Treatments $Self Care/Home Management : 8-22 mins  Janice Coffin, COTA/L 08/24/2015, 10:12 AM

## 2015-08-24 NOTE — Progress Notes (Signed)
Daily Progress Note   Patient Name: Kendra Johns       Date: 08/24/2015 DOB: Mar 13, 1951  Age: 63 y.o. MRN#: [id number] Attending Physician: Delfina Redwood, MD Primary Care Physician: [name], MD Admit Date: 08/21/2015  Reason for Consultation/Follow-up: Pain control  Subjective: Pt in more pain this am particularly with any movement. She tells me she has been on ms contin $RemoveB'60mg'VmszVUrC$  q8 scheduled for " a good while" as well as ms04 IR 30 mg . She also is someone who is trying to "tough it out" and at this point has gotten into a pain crisis. PRN's overnight : 90 mg ms04. She is reluctant to take flexeril except at bedtime because of sedation  Interval Events: none Length of Stay: 3 days  Current Medications: Scheduled Meds:  . aspirin  81 mg Oral Daily  . cholecalciferol  1,000 Units Oral Daily  . dexamethasone  4 mg Intravenous 4 times per day  . enoxaparin (LOVENOX) injection  40 mg Subcutaneous Q24H  . levothyroxine  50 mcg Oral QAC breakfast  . losartan  100 mg Oral Daily  . metoprolol tartrate  50 mg Oral BID  . morphine  75 mg Oral 3 times per day  . nicotine  14 mg Transdermal Daily  . senna  1 tablet Oral BID  . sodium chloride  3 mL Intravenous Q12H    Continuous Infusions: . lactated ringers 10 mL/hr at 08/22/15 1322    PRN Meds: sodium chloride, acetaminophen **OR** acetaminophen, alum & mag hydroxide-simeth, cyclobenzaprine, ipratropium, magic mouthwash, magnesium hydroxide, menthol-cetylpyridinium **OR** phenol, methocarbamol, metoCLOPramide **OR** metoCLOPramide (REGLAN) injection, morphine, ondansetron **OR** ondansetron (ZOFRAN) IV, promethazine, sodium chloride, sodium chloride, sucralfate, zolpidem  Palliative Performance Scale: 50%     Vital Signs: BP 156/91 mmHg  Pulse 77  Temp(Src) 98.4 F (36.9 C) (Oral)  Resp 18  Ht $R'5\' 4"'is$  (1.626 m)  Wt 67.3 kg (148 lb 5.9 oz)  BMI 25.46 kg/m2  SpO2 96% SpO2: SpO2: 96 % O2 Device: O2 Device: Not  Delivered O2 Flow Rate: O2 Flow Rate (L/min): 2 L/min  Intake/output summary:  Intake/Output Summary (Last 24 hours) at 08/24/15 1247 Last data filed at 08/24/15 0900  Gross per 24 hour  Intake    720 ml  Output    400 ml  Net    320 ml   LBM:   Baseline Weight: Weight: 66.497 kg (146 lb 9.6 oz) Most recent weight: Weight: 67.3 kg (148 lb 5.9 oz)  Physical Exam: General: Well nourished older female, a/o x 4; in severe pain Resp: No increased work of breathing Cardiac: Increased BP, likely due to pain              Additional Data Reviewed: Recent Labs     08/21/15  1711  08/22/15  2002  08/23/15  0450  WBC  5.9  6.6  5.6  HGB  13.2  13.0  12.0  PLT  146*  136*  141*  NA  140   --   138  BUN  14   --   13  CREATININE  0.69  0.76  0.73     Problem List:  Patient Active Problem List   Diagnosis Date Noted  . Palliative care encounter 08/23/2015  . Tobacco abuse 08/22/2015  . Encounter for palliative care   . Intractable pain 08/21/2015  . Cancer associated pain 08/21/2015  . Lytic bone lesion of right femur 08/21/2015  . Hypothyroidism 08/21/2015  .  Multiple myeloma not having achieved remission ([place]) 08/07/2015  . Vitamin D deficiency 05/28/2015  . Pathologic fracture 05/28/2015  . Chronic fatigue 03/07/2015  . Poor dentition 01/23/2015  . Leukopenia due to antineoplastic chemotherapy 12/20/2014  . Muscle cramp 11/21/2014  . Thrombocytopenia due to drugs 10/10/2014  . Gastritis 08/30/2014  . Cough 08/02/2014  . Abnormal LFTs 04/23/2014  . 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 SOB (shortness of breath) 02/13/2014  . Sciatic nerve pain   . Bilateral low back pain without sciatica 03/17/2011  . Hyperlipidemia 02/13/2011  . Essential hypertension 03/11/2010  . Status post bone marrow transplant ([place]) 08/21/2009  . Multiple myeloma ([place]) 09/14/2007  . HEPATITIS C, CHRONIC VIRAL, W/O HEPATIC COMA 08/31/2006  . Smoker 08/31/2006  . History of  adenomatous colon polyps 08/31/2006     Palliative Care Assessment & Plan    Code Status:  Full code  Goals of Care:  Continue with POC unchanged  Symptom Management:  Pain: Increase MS Contin 75 q8 atc and prn MS04 IR 30-45 q2 prn. Will add robaxin $RemoveBef'500mg'sHOfwIPjBr$  q6 prn for muscle spasms in addition to flexeril. This maybe less sedating during the day and augment pain contorl  Palliative Prophylaxis:  Bowel regimen  Psycho-social/Spiritual:  Desire for further Chaplaincy support:no   Prognosis: Unable to determine Discharge Planning: Home with [place] was discussed with Dr. Conley Canal  Thank you for allowing the Palliative Medicine Team to assist in the care of this patient.   Time In: 1200 Time Out: 1230 Total Time 30 min Prolonged Time Billed  no    Greater than 50%  of this time was spent counseling and coordinating care related to the above assessment and plan.   Dory Horn, NP  08/24/2015, 12:47 PM  Please contact Palliative Medicine Team phone at [phone] for questions and concerns.

## 2015-08-25 MED ORDER — DEXAMETHASONE 4 MG PO TABS
4.0000 mg | ORAL_TABLET | Freq: Four times a day (QID) | ORAL | Status: DC
  Administered 2015-08-25 – 2015-08-26 (×4): 4 mg via ORAL
  Filled 2015-08-25 (×3): qty 1

## 2015-08-25 MED ORDER — SENNOSIDES-DOCUSATE SODIUM 8.6-50 MG PO TABS
1.0000 | ORAL_TABLET | Freq: Two times a day (BID) | ORAL | Status: DC
  Administered 2015-08-25 – 2015-08-26 (×3): 1 via ORAL
  Filled 2015-08-25 (×3): qty 1

## 2015-08-25 MED ORDER — METOPROLOL TARTRATE 100 MG PO TABS
100.0000 mg | ORAL_TABLET | Freq: Two times a day (BID) | ORAL | Status: DC
  Administered 2015-08-25 – 2015-08-26 (×2): 100 mg via ORAL
  Filled 2015-08-25 (×2): qty 1

## 2015-08-25 NOTE — Clinical Social Work Note (Signed)
Clinical Social Work Assessment  Patient Details  Name: Kendra Johns MRN: [id number] Date of Birth: 1950/11/09  Date of referral:  08/25/15               Reason for consult:  Facility Placement                Permission sought to share information with:  Facility Art therapist granted to share information::  Yes, Verbal Permission Granted  Name::     [facility], patient' daughter   Agency::  SNF admissions  Relationship::     Contact Information:     Housing/Transportation Living arrangements for the past 2 months:  Single Family Home Source of Information:  Patient Patient Interpreter Needed:  None Criminal Activity/Legal Involvement Pertinent to Current Situation/Hospitalization:  No - Comment as needed Significant Relationships:  Adult Children Lives with:    Do you feel safe going back to the place where you live?  Yes (Patient agreed to short term rehab before going home.) Need for family participation in patient care:  No (Coment)  Care giving concerns: Patient feels she needs some short term rehab in order to return back home because she lives by herself.   Social Worker assessment / plan:  Patient is a pleasant and talkative 64 year old female who lives by herself.  Patient expresses that she initially did not want to go to SNF, but she thought about it and agreed to going to SNF for short term rehab now.  Patient was explained SNF bed search process and how insurance pays for her stay.  Patient expressed that she wants to get home as soon as she can, and plans to work hard in order to go home.  Patient asked questions in regards to going to SNF about if she can leave if she feels like it is not working out and also making sure patient can receive her chemo treatment.  CSW informed patient that they should be able to transport her to her appointments, but this will also limit her choices of SNFs that she can go to.  Patient expressed that she understands and  is in agreement to going to SNF for short term rehab.  Employment status:  Disabled (Comment on whether or not currently receiving Disability) Insurance information:  Medicare PT Recommendations:  [place] / Referral to community resources:  [place]  Patient/Family's Response to care:  Patient and family in agreement to going to SNF for short term rehab.  Patient/Family's Understanding of and Emotional Response to Diagnosis, Current Treatment, and Prognosis:  Patient is aware of her diagnosis and current treatment plan.  Patient did not express any other concerns or issues.  Emotional Assessment Appearance:  Appears stated age Attitude/Demeanor/Rapport:    Affect (typically observed):  Stable, Pleasant, Appropriate Orientation:  Oriented to Self, Oriented to Place, Oriented to  Time, Oriented to Situation Alcohol / Substance use:  Not Applicable Psych involvement (Current and /or in the community):  No (Comment)  Discharge Needs  Concerns to be addressed:  No discharge needs identified Readmission within the last 30 days:  No Current discharge risk:  None Barriers to Discharge:  No Barriers Identified   Ross Ludwig, LCSWA 08/25/2015, 1:03 PM

## 2015-08-25 NOTE — Progress Notes (Signed)
Subjective: 3 Days Post-Op Procedure(s) (LRB): INTRAMEDULLARY (IM) NAIL FEMORAL (Right) Patient reports pain as mild.  Patient still reports pain with any movement of right hip, however pain level has decreased since yesterday.  No nausea/vomiting, chest pain/sob.  Positive flatus but no bm.  Tolerating diet.  Objective: Vital signs in last 24 hours: Temp:  [97.9 F (36.6 C)-98.4 F (36.9 C)] 97.9 F (36.6 C) (10/23 0528) Pulse Rate:  [67-80] 80 (10/23 0528) Resp:  [17-18] 18 (10/23 0528) BP: (156-183)/(91-109) 158/91 mmHg (10/23 0528) SpO2:  [96 %-98 %] 98 % (10/23 0528)  Intake/Output from previous day: 10/22 0701 - 10/23 0700 In: 720 [P.O.:720] Out: -  Intake/Output this shift:     Recent Labs  08/22/15 2002 08/23/15 0450  HGB 13.0 12.0    Recent Labs  08/22/15 2002 08/23/15 0450  WBC 6.6 5.6  RBC 4.14 3.87  HCT 39.1 36.2  PLT 136* 141*    Recent Labs  08/22/15 2002 08/23/15 0450  NA  --  138  K  --  4.0  CL  --  104  CO2  --  24  BUN  --  13  CREATININE 0.76 0.73  GLUCOSE  --  127*  CALCIUM  --  8.6*   No results for input(s): LABPT, INR in the last 72 hours.  Neurologically intact Neurovascular intact Sensation intact distally Intact pulses distally Dorsiflexion/Plantar flexion intact Compartment soft  No drainage noted through dressing Negative homans bilaterally  Assessment/Plan: 3 Days Post-Op Procedure(s) (LRB): INTRAMEDULLARY (IM) NAIL FEMORAL (Right) Advance diet Up with therapy  WBAT RLE Dry dressing change prn Ok to d/c from ortho standpoint Continue plan per medicine  Fannie Knee 08/25/2015, 8:40 AM

## 2015-08-25 NOTE — Progress Notes (Addendum)
Progress Note   Kendra Johns GLO:[id number] DOB: 19-Aug-1951 DOA: 08/21/2015 PCP: [name], MD   Brief Narrative:   Kendra Johns is an 64 y.o. 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 Alvy Bimler, who was admitted 08/21/15 with a chief complaint of a 1 month history of worsening right thigh pain resulting in inability to ambulate/bear weight. Plain films done in the ED showed a lytic lesion of the right femur.   Assessment/Plan:    Principal Problem:  pathologic fx right femur with intractable cancer associated pain Pain better controlled today. Palliative adjusting medications Change decadron to PO per patient request S/p IM nail 10/20.  Now agreeable to SNF. Will needon that can transport to XRT  Active Problems:   Tobacco Abuse - Nicotine patch ordered.  Counseled.   Essential hypertension  uncontrolled. Pt reports chronically uncontrolled. Cozaar increased. Increase metoprolol   Hypothyroidism - Continue Synthroid.   Vitamin D deficiency - Continue vitamin D supplementation.   Multiple myeloma not having achieved remission (HCC)/ Status post bone marrow transplant ([place])  progressing despite therapy.   Thrombocytopenia due to drugs - Platelet count mildly reduced. Monitor.   DVT prophylaxis - Lovenox ordered.  Code Status: Full. Family Communication:  Multiple at bedside Disposition Plan: SNF soon   diagnostic studies:   No results found.   Medical Consultants:     orthopedics  Palliative care     oncology  Anti-Infectives:   Anti-infectives    Start     Dose/Rate Route Frequency Ordered Stop   08/22/15 2200  ceFAZolin (ANCEF) IVPB 2 g/50 mL premix     2 g 100 mL/hr over 30 Minutes Intravenous Every 6 hours 08/22/15 1855 08/23/15 0426   08/22/15 1145  ceFAZolin (ANCEF) IVPB 2 g/50 mL premix  Status:  Discontinued     2 g 100 mL/hr over 30 Minutes Intravenous On call to O.R. 08/22/15 1144 08/22/15 1352      Subjective:   Pain slightly improved.  Objective:    Filed Vitals:   08/24/15 2043 08/25/15 0528 08/25/15 1100 08/25/15 1108  BP: 183/109 158/91 168/99   Pulse: 67 80 102 72  Temp: 98 F (36.7 C) 97.9 F (36.6 C)    TempSrc: Oral Oral    Resp: 17 18    Height:      Weight:      SpO2: 97% 98%      Intake/Output Summary (Last 24 hours) at 08/25/15 1154 Last data filed at 08/25/15 1152  Gross per 24 hour  Intake    490 ml  Output      0 ml  Net    490 ml   Filed Weights   08/21/15 1851 08/22/15 1836  Weight: 66.497 kg (146 lb 9.6 oz) 67.3 kg (148 lb 5.9 oz)    Exam: Gen:  In chair with makeup on. A and o Cardiovascular:  RRR, No M/R/G Respiratory:  Lungs CTAB Gastrointestinal:  Abdomen soft, NT/ND, + BS Extremities:  Dressing CDI. No edema   Data Reviewed:    Labs: Basic Metabolic Panel:  Recent Labs Lab 08/21/15 1711 08/22/15 2002  08/23/15 0450  NA 140  --  138  K 3.9  --  4.0  CL 107  --  104  CO2 25  --  24  GLUCOSE 120*  --  127*  BUN 14  --  13  CREATININE 0.69 0.76 0.73  CALCIUM 9.2  --  8.6*   GFR Estimated Creatinine Clearance: 66.1 mL/min (by C-G formula based on Cr of 0.73). Liver Function Tests: No results for input(s): AST, ALT, ALKPHOS, BILITOT, PROT, ALBUMIN in the last 168 hours. No results for input(s): LIPASE, AMYLASE in the last 168 hours. No results for input(s): AMMONIA in the last 168 hours. Coagulation profile No results for input(s): INR, PROTIME in the last 168 hours.  CBC:  Recent Labs Lab  08/21/15 1711 08/22/15 2002 08/23/15 0450  WBC 5.9 6.6 5.6  NEUTROABS 4.0  --   --   HGB 13.2 13.0 12.0  HCT 39.1 39.1 36.2  MCV 93.8 94.4 93.5  PLT 146* 136* 141*   Microbiology Recent Results (from the past 240 hour(s))  Surgical pcr screen     Status: None   Collection Time: 08/22/15 12:08 PM  Result Value Ref Range Status   MRSA, PCR NEGATIVE NEGATIVE Final   Staphylococcus aureus NEGATIVE NEGATIVE Final    Comment:        The Xpert SA Assay (FDA approved for NASAL specimens in patients over 61 years of age), is one component of a comprehensive surveillance program.  Test performance has been validated by [facility] for patients greater than or equal to 5 year old. It is not intended to diagnose infection nor to guide or monitor treatment.      Medications:   . aspirin  81 mg Oral Daily  . cholecalciferol  1,000 Units Oral Daily  . dexamethasone  4 mg Intravenous 4 times per day  . enoxaparin (LOVENOX) injection  40 mg Subcutaneous Q24H  . levothyroxine  50 mcg Oral QAC breakfast  . losartan  100 mg Oral Daily  . metoprolol tartrate  50 mg Oral BID  . morphine  75 mg Oral 3 times per day  . nicotine  14 mg Transdermal Daily  . senna  1 tablet Oral BID  . sodium chloride  3 mL Intravenous Q12H   Continuous Infusions: . lactated ringers 10 mL/hr at 08/22/15 1322    Time spent: 15 min   LOS: 4 days   [facility]  [email or web address], password [facility]  08/25/2015, 11:54 AM

## 2015-08-25 NOTE — Care Management Note (Signed)
Case Management Note  Patient Details  Name: Kendra Johns MRN: [id number] Date of Birth: 1950/11/03  Subjective/Objective:                  Lytic bone lesion of right femur with intractable cancer associated pain  Action/Plan: CM spoke to patient at the bedside along with daughter. Patient previously was declining SNF placement but states now that after discussion with family, MD, and PT-that she would like to go to SNF at discharge in order to maintain safety and progress. CM advised SW on 08/24/15 about patient requesting SNF information again and today, patient states that she has already met with SW and was given a list of facilities. Pt aware that the facility chosen would have to be able to transport her to radiation treatment and pt states that the SW was going to look into facilities that would be able to provide that service. CM remains available for further discharge planning needs but anticipate SW arranging SNF placement as reccommended by PT/OT.   Expected Discharge Date:   (unknown)               Expected Discharge Plan:  Skilled Nursing Facility  In-House Referral:  Clinical Social Work  Discharge planning Services  CM Consult  Post Acute Care Choice:    Choice offered to:     DME Arranged:    DME Agency:     HH Arranged:    [place] Agency:     Status of Service:  In process, will continue to follow  Medicare Important Message Given:  Yes-second notification given Date Medicare IM Given:    Medicare IM give by:    Date Additional Medicare IM Given:    Additional Medicare Important Message give by:     If discussed at [place] of Stay Meetings, dates discussed:    Additional Comments:  Guido Sander, RN 08/25/2015, 11:55 AM

## 2015-08-25 NOTE — Clinical Social Work Placement (Addendum)
   CLINICAL SOCIAL WORK PLACEMENT  NOTE  Date:  08/25/2015  Patient Details  Name: Kendra Johns MRN: [id number] Date of Birth: [birth year]  Clinical Social Work is seeking post-discharge placement for this patient at the [place] level of care (*CSW will initial, date and re-position this form in  chart as items are completed):  Yes   Patient/family provided with [place] Work Department's list of facilities offering this level of care within the geographic area requested by the patient (or if unable, by the patient's family).  Yes   Patient/family informed of their freedom to choose among providers that offer the needed level of care, that participate in Medicare, Medicaid or managed care program needed by the patient, have an available bed and are willing to accept the patient.  Yes   Patient/family informed of [place]'s ownership interest in [facility] and [facility], as well as of the fact that they are under no obligation to receive care at these facilities.  PASRR submitted to EDS on 08/25/15     PASRR number received on 08/25/15     Existing PASRR number confirmed on       FL2 transmitted to all facilities in geographic area requested by pt/family on 08/25/15     FL2 transmitted to all facilities within larger geographic area on 08/25/15     Patient informed that his/her managed care company has contracts with or will negotiate with certain facilities, including the following:         08/26/15   Patient/family informed of bed offers received.  Patient chooses bed at  [facility]     Physician recommends and patient chooses bed at      Patient to be transferred to  [facility] on  08/26/15.  Patient to be transferred to facility by  [facility]     Patient family notified on  08/26/15 of transfer.  Name of family member notified:   Patient notified her daughter Baxter Flattery      PHYSICIAN Please sign FL2     Additional Comment:     _______________________________________________ Ross Ludwig, LCSWA 08/25/2015, 1:09 PM

## 2015-08-26 ENCOUNTER — Ambulatory Visit: Payer: Medicare Other

## 2015-08-26 ENCOUNTER — Other Ambulatory Visit: Payer: Self-pay | Admitting: Family Medicine

## 2015-08-26 ENCOUNTER — Telehealth: Payer: Self-pay | Admitting: *Deleted

## 2015-08-26 ENCOUNTER — Ambulatory Visit
Admit: 2015-08-26 | Discharge: 2015-08-26 | Disposition: A | Discharge status: Home / Self Care | Payer: Medicare Other | Attending: Radiation Oncology | Admitting: Radiation Oncology

## 2015-08-26 DIAGNOSIS — G893 Neoplasm related pain (acute) (chronic): Secondary | ICD-10-CM

## 2015-08-26 DIAGNOSIS — E034 Atrophy of thyroid (acquired): Secondary | ICD-10-CM | POA: Diagnosis not present

## 2015-08-26 DIAGNOSIS — E039 Hypothyroidism, unspecified: Secondary | ICD-10-CM | POA: Diagnosis not present

## 2015-08-26 DIAGNOSIS — S72002A Fracture of unspecified part of neck of left femur, initial encounter for closed fracture: Secondary | ICD-10-CM | POA: Diagnosis not present

## 2015-08-26 DIAGNOSIS — M1990 Unspecified osteoarthritis, unspecified site: Secondary | ICD-10-CM | POA: Diagnosis not present

## 2015-08-26 DIAGNOSIS — E038 Other specified hypothyroidism: Secondary | ICD-10-CM | POA: Diagnosis not present

## 2015-08-26 DIAGNOSIS — R2681 Unsteadiness on feet: Secondary | ICD-10-CM | POA: Diagnosis not present

## 2015-08-26 DIAGNOSIS — M898X5 Other specified disorders of bone, thigh: Secondary | ICD-10-CM | POA: Diagnosis not present

## 2015-08-26 DIAGNOSIS — R52 Pain, unspecified: Secondary | ICD-10-CM | POA: Diagnosis not present

## 2015-08-26 DIAGNOSIS — E559 Vitamin D deficiency, unspecified: Secondary | ICD-10-CM | POA: Diagnosis not present

## 2015-08-26 DIAGNOSIS — C9000 Multiple myeloma not having achieved remission: Secondary | ICD-10-CM | POA: Diagnosis not present

## 2015-08-26 DIAGNOSIS — E785 Hyperlipidemia, unspecified: Secondary | ICD-10-CM | POA: Diagnosis not present

## 2015-08-26 DIAGNOSIS — Z7901 Long term (current) use of anticoagulants: Secondary | ICD-10-CM | POA: Diagnosis not present

## 2015-08-26 DIAGNOSIS — I10 Essential (primary) hypertension: Secondary | ICD-10-CM | POA: Diagnosis not present

## 2015-08-26 DIAGNOSIS — M84459D Pathological fracture, hip, unspecified, subsequent encounter for fracture with routine healing: Secondary | ICD-10-CM | POA: Diagnosis not present

## 2015-08-26 DIAGNOSIS — Z5181 Encounter for therapeutic drug level monitoring: Secondary | ICD-10-CM | POA: Diagnosis not present

## 2015-08-26 DIAGNOSIS — D6959 Other secondary thrombocytopenia: Secondary | ICD-10-CM | POA: Diagnosis not present

## 2015-08-26 DIAGNOSIS — M6281 Muscle weakness (generalized): Secondary | ICD-10-CM | POA: Diagnosis not present

## 2015-08-26 MED ORDER — SENNOSIDES-DOCUSATE SODIUM 8.6-50 MG PO TABS: 1.0000 | ORAL_TABLET | Freq: Every day | ORAL | Status: DC

## 2015-08-26 MED ORDER — CYCLOBENZAPRINE HCL 10 MG PO TABS: 10.0000 mg | ORAL_TABLET | Freq: Three times a day (TID) | ORAL | Status: DC | PRN

## 2015-08-26 MED ORDER — METOPROLOL TARTRATE 100 MG PO TABS: 100.0000 mg | ORAL_TABLET | Freq: Two times a day (BID) | ORAL | Status: DC

## 2015-08-26 MED ORDER — MORPHINE SULFATE ER 15 MG PO TBCR: 75.0000 mg | EXTENDED_RELEASE_TABLET | Freq: Three times a day (TID) | ORAL | Status: DC

## 2015-08-26 MED ORDER — MORPHINE SULFATE 30 MG PO TABS: 30.0000 mg | ORAL_TABLET | ORAL | Status: DC | PRN

## 2015-08-26 MED ORDER — SORBITOL 70 % SOLN
960.0000 mL | TOPICAL_OIL | Freq: Once | ORAL | Status: AC
  Administered 2015-08-26: 960 mL via RECTAL
  Filled 2015-08-26: qty 240

## 2015-08-26 NOTE — Care Management Important Message (Signed)
Important Message  Patient Details  Name: Kendra Johns MRN: [id number] Date of Birth: 09-23-1951   Medicare Important Message Given:  Yes-third notification given    Delorse Lek 08/26/2015, 12:23 PM

## 2015-08-26 NOTE — Telephone Encounter (Signed)
All her appt are so late The latest I can stay is on 10/27 at 430 pm just to review treatment options with her Or we can wait until she finish her Rx Can you call and ask which one would she prefer?

## 2015-08-26 NOTE — Progress Notes (Signed)
     Subjective:  POD#4 IM nail of the R hip for a pathologic fracture.  Patient reports pain as moderate.  Resting comfortably in bed this morning.  PT recommending SNF at discharge.  Bed placement pending.  Good from ortho standpoint to discharge.   Objective:   VITALS:   Filed Vitals:   08/25/15 1100 08/25/15 1108 08/25/15 1532 08/25/15 2000  BP: 168/99  143/80 150/82  Pulse: 102 72 54 66  Temp:   98.4 F (36.9 C) 99 F (37.2 C)  TempSrc:    Oral  Resp:   18 19  Height:      Weight:      SpO2:   95% 97%    Neurologically intact ABD soft Neurovascular intact Sensation intact distally Intact pulses distally Dorsiflexion/Plantar flexion intact Incision: dressing C/D/I   Lab Results  Component Value Date   WBC 5.6 08/23/2015   HGB 12.0 08/23/2015   HCT 36.2 08/23/2015   MCV 93.5 08/23/2015   PLT 141* 08/23/2015   BMET    Component Value Date/Time   NA 138 08/23/2015 0450   NA 140 08/07/2015 0932   K 4.0 08/23/2015 0450   K 3.3* 08/07/2015 0932   CL 104 08/23/2015 0450   CL 107 03/10/2013 1014   CO2 24 08/23/2015 0450   CO2 23 08/07/2015 0932   GLUCOSE 127* 08/23/2015 0450   GLUCOSE 106 08/07/2015 0932   GLUCOSE 111* 03/10/2013 1014   GLUCOSE 98 08/31/2006 1024   BUN 13 08/23/2015 0450   BUN 11.8 08/07/2015 0932   CREATININE 0.73 08/23/2015 0450   CREATININE 0.8 08/07/2015 0932   CALCIUM 8.6* 08/23/2015 0450   CALCIUM 9.1 08/07/2015 0932   GFRNONAA >60 08/23/2015 0450   GFRAA >60 08/23/2015 0450     Assessment/Plan: 4 Days Post-Op   Principal Problem:   Lytic bone lesion of right femur Active Problems:   Essential hypertension   Status post bone marrow transplant ([place])   Thrombocytopenia due to drugs   Vitamin D deficiency   Multiple myeloma not having achieved remission (HCC)   Intractable pain   Cancer associated pain   Hypothyroidism   Tobacco abuse   Encounter for palliative care   Palliative care encounter   Up with  therapy WBAT in the RLE ASA 316m daily for dvt prophylaxis Ok for discharge to SNF from ortho standpoint.  Will continue to follow the patient on an outpatient basis.   Virtie Bungert MLelan Pons10/24/2016, 6:49 AM Cell ([phone]

## 2015-08-26 NOTE — Discharge Summary (Signed)
Physician Discharge Summary  Kendra Johns CZY:[id number] DOB: [birth year] DOA: 08/21/2015  PCP: [name], MD  Admit date: 08/21/2015 Discharge date: 08/26/2015  Time spent: 35 minutes  Recommendations for Outpatient Follow-up:  1. To SNF 2. Will need bowel regimen 3. XRT per radiation oncology  Discharge Diagnoses:  Principal Problem:   Lytic bone lesion of right femur Active Problems:   Essential hypertension   Status post bone marrow transplant ([place])   Thrombocytopenia due to drugs   Vitamin D deficiency   Multiple myeloma not having achieved remission (HCC)   Intractable pain   Cancer associated pain   Hypothyroidism   Tobacco abuse   Encounter for palliative care   Palliative care encounter   Discharge Condition: improved  Diet recommendation: heart healthy  Filed Weights   08/21/15 1851 08/22/15 1836  Weight: 66.497 kg (146 lb 9.6 oz) 67.3 kg (148 lb 5.9 oz)    History of present illness:   Kendra Johns is an 64 y.o. 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 Alvy Bimler, who presents with right thigh pain. The patient states that she has had gradually worsening right thigh pain for the past 1 month, with no relief from her usual pain medications. The pain has progressed to the point where she has had trouble ambulating over the past week, and was unable to bear weight on it this morning. Plain films done in the ED show a lytic lesion of the right femur. The patient's oncologist was notified and she recommended inpatient management of pain  with initiation of IV steroids and consultation with radiation oncology.   Hospital Course:  pathologic fx right femur with intractable cancer associated pain Pain better controlled today. Palliative adjusting medications Change decadron to PO per patient request S/p IM nail 10/20. Now agreeable to SNF. Will needon that can transport to XRT   Tobacco Abuse - Nicotine patch ordered. Counseled.   Essential hypertension uncontrolled. Pt reports chronically uncontrolled. Cozaar increased. Increase metoprolol   Hypothyroidism - Continue Synthroid.   Vitamin D deficiency - Continue vitamin D supplementation.   Multiple myeloma not having achieved remission (HCC)/ Status post bone marrow transplant ([place]) progressing despite therapy.   Thrombocytopenia due to drugs - Platelet count mildly reduced. Monitor.   DVT prophylaxis - Lovenox ordered per ortho  Constipation PRN enema -daily docusate  Procedures:  IM nail of the R hip for a pathologic fracture  Consultations:  Ortho  Palliative care  Discharge Exam: Filed Vitals:   08/26/15 0827  BP: 138/70  Pulse: 57  Temp: 98.2 F (36.8 C)  Resp: 16    General: awake, NAD   Discharge Instructions   Discharge Instructions    Diet - low sodium heart healthy    Complete by:  As directed      Discharge instructions    Complete by:  As directed   During your recent anesthetic, you were given the medication sugammadex ([place]). This medication interacts with hormonal forms of birth control (oral contraceptives and injected or implanted birth control) and may make them ineffective. IFYOU USE ANY HORMONAL FORM OF BIRTH CONTROL, YOU MUST USE AN  ADDITIONAL BARRIER BIRTH CONTROL FOR METHOD FOR SEVEN DAYS after receiving sugammadex ([place]) or there is a chance you could become pregnant.     Discharge instructions    Complete by:  As directed   Need daily bowel regimen due to narcotics     Increase activity  slowly    Complete by:  As directed      Weight bearing as tolerated    Complete by:  As directed   Laterality:  right  Extremity:  Lower          Current Discharge Medication List    START taking these medications   Details  enoxaparin (LOVENOX) 40 MG/0.4ML injection Inject 0.4 mLs (40 mg total) into the skin daily. Qty: 30 Syringe, Refills: 0    metoprolol (LOPRESSOR) 100 MG tablet Take 1 tablet (100 mg total) by mouth 2 (two) times daily.    senna-docusate (SENOKOT-S) 8.6-50 MG tablet Take 1 tablet by mouth daily.      CONTINUE these medications which have CHANGED   Details  cyclobenzaprine (FLEXERIL) 10 MG tablet Take 1 tablet (10 mg total) by mouth 3 (three) times daily as needed for muscle spasms. Qty: 15 tablet, Refills: 0   Associated Diagnoses: Muscle cramp    morphine (MS CONTIN) 15 MG 12 hr tablet Take 5 tablets (75 mg total) by mouth every 8 (eight) hours. Qty: 60 tablet, Refills: 0    morphine (MSIR) 30 MG tablet Take 1-1.5 tablets (30-45 mg total) by mouth every 2 (two) hours as needed for moderate pain or severe pain. Qty: 30 tablet, Refills: 0      CONTINUE these medications which have NOT CHANGED   Details  aspirin 81 MG tablet Take 81 mg by mouth daily.    Cholecalciferol (VITAMIN D3) 5000 UNITS TABS Take 5,000 mg by mouth every morning. Qty: 30 tablet, Refills: 0    dexamethasone (DECADRON) 4 MG tablet TAKE 1 TABLET (4 MG TOTAL) BY MOUTH DAILY. Qty: 30 tablet, Refills: 1    ergocalciferol (VITAMIN D2) 50000 UNITS capsule Take 1 capsule (50,000 Units total) by mouth once a week. Qty: 12 capsule, Refills: 3   Associated Diagnoses: Multiple myeloma ([place]); Vitamin D deficiency    ipratropium (ATROVENT HFA) 17 MCG/ACT inhaler Inhale 2 puffs into the lungs every 4 (four) hours as needed for wheezing. Qty: 1 Inhaler, Refills: 12   Associated Diagnoses: Smoker; Cough    lenalidomide (REVLIMID) 25 MG capsule TAKE ONE CAPSULE BY MOUTH EVERY DAY FOR 21 DAYS  THEN 7 DAYS OFF. Qty: 21 capsule, Refills: 0   Associated Diagnoses: Multiple myeloma (HCC)    levothyroxine (SYNTHROID) 50 MCG tablet Take 1 tablet (50 mcg total) by mouth daily before breakfast. Qty: 30 tablet, Refills: 3    lidocaine-prilocaine (EMLA) cream Apply 1 application topically as needed. Qty: 30 g, Refills: 3   Associated Diagnoses: Multiple myeloma (HCC)    losartan (COZAAR) 50 MG tablet TAKE 1/2 TO 1 TABLET DAILY Qty: 30 tablet, Refills: 0    magic mouthwash SOLN Take 10 mLs by mouth 4 (four) times daily as needed for mouth pain. Swish and spit  Or  Swish and swallow. Qty: 480 mL, Refills: 2   Associated Diagnoses: Multiple myeloma (HCC)    magnesium hydroxide (MILK OF MAGNESIA) 400 MG/5ML suspension Take 30 mLs by mouth daily as needed for mild constipation (constipation).     sucralfate (CARAFATE) 1 G tablet Take 1 tablet (1 g total) by mouth 4 (four) times daily. Qty:  120 tablet, Refills: 2      STOP taking these medications     PRESCRIPTION MEDICATION      prochlorperazine (COMPAZINE) 10 MG tablet        Allergies  Allergen Reactions  . Codeine Itching  . Ibuprofen Other (See Comments)    REACTION: gi trouble  . Albuterol Hives    Pt tolerated albuterol neb without issue 12/2014   Follow-up Information    Follow up with MURPHY, TIMOTHY D, MD In 10 days.   Specialty:  Orthopedic Surgery   Contact information:   [address] [place] [address] [phone]       Follow up with [name], MD In 1 week.   Specialty:  Family Medicine   Contact information:   [address] [place] [address] [phone]        The results of significant diagnostics from this hospitalization (including imaging, microbiology, ancillary and laboratory) are listed below for reference.    Significant Diagnostic Studies: Pelvis Portable  08/22/2015  CLINICAL DATA:  Right Hip fracture requiring operative repair ([place]), post op  EXAM: PORTABLE PELVIS 1-2 VIEWS COMPARISON:  08/22/2015 FINDINGS: Status post right IM nail/lag screw fixation of right sub trochanteric pathologic hip fracture. Femoral head appears located in the acetabulum. Previous vertebroplasty. IMPRESSION: Status post ORIF right hip. Electronically Signed   By: Nolon Nations M.D.   On: 08/22/2015 17:13   Dg C-arm 1-60 Min  08/22/2015  CLINICAL DATA:  Multiple myeloma, with a pathologic fracture. RIGHT leg pain. EXAM: RIGHT FEMUR 2 VIEWS; DG C-ARM 61-120 MIN COMPARISON:  Plain films most recent 08/21/2015. FINDINGS: Intra medullary rod with compression screw has been placed across the proximal femoral subtrochanteric pathologic fracture. Satisfactory position and alignment. IMPRESSION: As above. Electronically Signed   By: Staci Righter M.D.   On: 08/22/2015 16:31   Mr Total Spine Mets Screening  08/01/2015  CLINICAL DATA:  64 year old female with multiple myeloma, spine pain radiating to the right hip and lower extremity for 3 weeks. Right lower extremity weakness and numbness. Chemotherapy. Palliative radiation therapy to C7-T1 completed in August. Subsequent encounter. EXAM: MRI TOTAL SPINE WITHOUT AND WITH CONTRAST TECHNIQUE: Multisequence MR imaging of the spine from the cervical spine to the sacrum was performed prior to and following IV contrast administration for evaluation of spinal metastatic disease. CONTRAST:  13 mL MultiHance COMPARISON:  Total spine exam 05/27/2015. FINDINGS: Cervical Findings: Grossly negative visualized brain parenchyma. Skullbase through C3 levels appear stable and without tumor. Stable C4 compression fracture, no marrow edema or enhancement. C5 and C6 levels appear stable and without tumor. C7 level described in the thoracic section below. No malignant cervical spinal stenosis. No cervical spinal cord signal abnormality. No cervical spine intradural enhancement. Visible paraspinal soft tissues appear stable without tumor. Thoracic  Findings: Improved appearance of T1 status post radiation. Regressed expansile tumor. Severe compression of the T1 vertebra but no retropulsion. Continued marrow edema and enhancement not involving the posterior elements. The adjacent C7 inferior endplate appears mildly heterogeneous but without associated enhancement to strongly suggest tumor. The C7 level otherwise remains within normal limits. Heterogeneity of the T2 and T3 vertebral bodies is stable with patchy enhancement (T3 posterior inferior vertebral body series 13, image 6). Right T7 rib tumor partially visible on series 5, image 3 and appears stable to mildly regressed (14-15 mm diameter now versus 17 mm in July). Otherwise no definite tumor T4 through T9. Posterior element tumor at T10  with marrow edema and enhancement has not significantly changed. No epidural or paraspinal extension. No tumor identified at T11 or T12. No thoracic spinal stenosis, spinal cord signal abnormality, or abnormal dural thickening/ enhancement. Lumbar Findings: Progressed L1 vertebral body tumor, subtotal vertebral body involvement now (series 9, image 6). Early pedicle involvement bilaterally. No posterior element involvement. No epidural or paraspinal extension. Posterior inferior L3 vertebral body tumor appears stable since July. No epidural or paraspinal extension. Treated L5 pathologic fracture appears stable with mild residual edema and enhancement. No tumor identified at L2 or L4. No central sacral tumor identified. No malignant lumbar spinal stenosis. Cauda equina nerve roots appear within normal limits. IMPRESSION: 1. Stable cervical spine, no definite involvement by myeloma. 2. Improved appearance of T1 status post radiation. Right T7 rib tumor also appears mildly regressed. 3. Stable lesser myelomatous involvement at T2, T3 and T10. 4. Mild progression of L1 vertebral body tumor. No epidural or paraspinal involvement. 5. L3 vertebral body involvement and treated  pathologic fracture at L5 are stable. 6. No malignant spinal stenosis. No spinal cord or cauda equina abnormality. Electronically Signed   By: Genevie Ann M.D.   On: 08/01/2015 14:47   Dg Hip Unilat With Pelvis 2-3 Views Right  08/14/2015  CLINICAL DATA:  Pain.  Multiple myeloma. EXAM: DG HIP (WITH OR WITHOUT PELVIS) 2-3V RIGHT COMPARISON:  None. FINDINGS: Degenerative changes lumbar spine and both hips. Diffuse progress osteopenia. This may be secondary to myeloma. Prominent lytic lesions proximal right femoral shaft. Scratched lytic lesion right iliac wing cannot be excluded. Methylmethacrylate again noted L5 and in the right sacral wing. Pelvic calcifications consistent phleboliths . IMPRESSION: Progressive diffuse osteopenia with lytic lesions in the right proximal femur and and possibly the right iliac wing. Multiple myeloma could present in this fashion . No evidence of fracture. The proximal right femoral shaft lytic lesion is prominent and would be prone to fracture. Orthopedic/radiation therapy consultation should be considered . Electronically Signed   By: Marcello Moores  Register   On: 08/14/2015 13:38   Dg Femur 1v Right  08/21/2015  CLINICAL DATA:  Multiple myeloma, known RIGHT femoral tumor, increased pain for 1 day EXAM: RIGHT FEMUR 1 VIEW COMPARISON:  08/14/2015 FINDINGS: Osseous demineralization. Poorly defined lytic lesions are identified at the proximal and proximal/mid RIGHT femoral diaphysis compatible with multiple myeloma. Endosteal cortical thinning at the larger more proximal femoral lytic lesion. A small amount of periosteal reaction is identified adjacent to the more cranial of the proximal femoral lesions, located in the subtrochanteric region. Additionally, subtle linear cortical lucency is seen in the medial cortex of the proximal RIGHT femur inferior to the lesser trochanter likely representing a developing pathologic fracture. Tiny rounded lytic lesion of the distal RIGHT femoral  metaphysis is noted. Knee and hip joint alignments normal. No additional fracture, dislocation or bone destruction. IMPRESSION: Lytic lesions within the RIGHT femoral diaphysis compatible with multiple myeloma. Periosteal reaction and subtle cortical linear lucency at thinned medial cortex of the proximal RIGHT femoral diaphysis inferior to the lesser trochanter likely representing a developing pathologic fracture. Electronically Signed   By: Lavonia Dana M.D.   On: 08/21/2015 16:45   Dg Femur, Min 2 Views Right  08/22/2015  CLINICAL DATA:  Multiple myeloma, with a pathologic fracture. RIGHT leg pain. EXAM: RIGHT FEMUR 2 VIEWS; DG C-ARM 61-120 MIN COMPARISON:  Plain films most recent 08/21/2015. FINDINGS: Intra medullary rod with compression screw has been placed across the proximal femoral subtrochanteric pathologic fracture. Satisfactory  position and alignment. IMPRESSION: As above. Electronically Signed   By: Staci Righter M.D.   On: 08/22/2015 16:31   Dg Femur, Min 2 Views Right  08/14/2015  CLINICAL DATA:  Two weeks of right knee pain without reported trauma; multiple myeloma not in remission, currently on chemotherapy. EXAM: RIGHT FEMUR 2 VIEWS COMPARISON:  Images of the right femur and right knee dated May 24, 2014 FINDINGS: There is a5 mm diameter well-circumscribed lucency in the distal third of shaft of the right femur which is stable. Lucencies previously demonstrated more inferiorly are less well defined and may have increased in size. No cortical disruption is observed. The knee joint compartments appear reasonably well maintained. There is beaking of the tibial spines and tiny spurs from the articular margins of the patella. IMPRESSION: Lucencies in the distal third of the femur which likely reflect myelomatous involvement. There are mild degenerative changes of the right knee. Electronically Signed   By: David  Martinique M.D.   On: 08/14/2015 13:37    Microbiology: Recent Results (from the  past 240 hour(s))  Surgical pcr screen     Status: None   Collection Time: 08/22/15 12:08 PM  Result Value Ref Range Status   MRSA, PCR NEGATIVE NEGATIVE Final   Staphylococcus aureus NEGATIVE NEGATIVE Final    Comment:        The Xpert SA Assay (FDA approved for NASAL specimens in patients over 87 years of age), is one component of a comprehensive surveillance program.  Test performance has been validated by [facility] for patients greater than or equal to 35 year old. It is not intended to diagnose infection nor to guide or monitor treatment.      Labs: Basic Metabolic Panel:  Recent Labs Lab 08/21/15 1711 08/22/15 2002 08/23/15 0450  NA 140  --  138  K 3.9  --  4.0  CL 107  --  104  CO2 25  --  24  GLUCOSE 120*  --  127*  BUN 14  --  13  CREATININE 0.69 0.76 0.73  CALCIUM 9.2  --  8.6*   Liver Function Tests: No results for input(s): AST, ALT, ALKPHOS, BILITOT, PROT, ALBUMIN in the last 168 hours. No results for input(s): LIPASE, AMYLASE in the last 168 hours. No results for input(s): AMMONIA in the last 168 hours. CBC:  Recent Labs Lab 08/21/15 1711 08/22/15 2002 08/23/15 0450  WBC 5.9 6.6 5.6  NEUTROABS 4.0  --   --   HGB 13.2 13.0 12.0  HCT 39.1 39.1 36.2  MCV 93.8 94.4 93.5  PLT 146* 136* 141*   Cardiac Enzymes: No results for input(s): CKTOTAL, CKMB, CKMBINDEX, TROPONINI in the last 168 hours. BNP: BNP (last 3 results) No results for input(s): BNP in the last 8760 hours.  ProBNP (last 3 results) No results for input(s): PROBNP in the last 8760 hours.  CBG:  Recent Labs Lab 08/24/15 1203 08/24/15 1648  GLUCAP 154* 155*       Signed:  Avannah Decker  [facility] 08/26/2015, 10:28 AM

## 2015-08-26 NOTE — Progress Notes (Signed)
Physical Therapy Treatment Patient Details Name: Kendra Johns MRN: [id number] DOB: January 24, 1951 Today's Date: 08/26/2015    History of Present Illness 64 yo female with history of plasmacytoma, radiation, multiple myeloma, stemcell transplant, now recent bone pain L1 and R thigh pain.  Lesion R femur with IM nailing done, now referred to PT.    PT Comments    Patient is making gradual progress with PT.  From a mobility standpoint anticipate patient will need further rehabilitation as SNF upon D/C from acute care. Patient in agreement during time of session.     Follow Up Recommendations  SNF     Equipment Recommendations  None recommended by PT    Recommendations for Other Services       Precautions / Restrictions Precautions Precautions: Fall Restrictions Weight Bearing Restrictions: Yes RLE Weight Bearing: Weight bearing as tolerated    Mobility  Bed Mobility               General bed mobility comments: sitting edge of bed upon arrival  Transfers Overall transfer level: Needs assistance Equipment used: Rolling walker (2 wheeled) Transfers: Sit to/from Stand Sit to Stand: Min assist         General transfer comment: using both hand to push up from walker, discussing need to use one hand from bed or rail.   Ambulation/Gait Ambulation/Gait assistance: Min guard Ambulation Distance (Feet): 20 Feet Assistive device: Rolling walker (2 wheeled) Gait Pattern/deviations: Step-to pattern;Decreased weight shift to right;Decreased stance time - right Gait velocity: decreased   General Gait Details: Compensatory Lt heel raise to assist with moving Rt LE forward with swing phase. Cues to use avoid compensations,improving with active hip flexion following.    Stairs            Wheelchair Mobility    Modified Rankin (Stroke Patients Only)       Balance Overall balance assessment: Needs assistance Sitting-balance support: No upper extremity  supported Sitting balance-Leahy Scale: Good     Standing balance support: Bilateral upper extremity supported Standing balance-Leahy Scale: Poor Standing balance comment: using rw                    Cognition Arousal/Alertness: Awake/alert Behavior During Therapy: WFL for tasks assessed/performed Overall Cognitive Status: Within Functional Limits for tasks assessed                      Exercises Total Joint Exercises Ankle Circles/Pumps: AROM;Both;10 reps Quad Sets: Strengthening;Right;10 reps Gluteal Sets: Strengthening;Both;10 reps Heel Slides: AAROM;Right;10 reps    General Comments        Pertinent Vitals/Pain Pain Assessment: 0-10 Pain Score: 2  Pain Location: Rt hip Pain Descriptors / Indicators: Sore Pain Intervention(s): Limited activity within patient's tolerance;Monitored during session    Home Living                      Prior Function            PT Goals (current goals can now be found in the care plan section) Acute Rehab PT Goals Patient Stated Goal: eventually get home PT Goal Formulation: With patient/family Time For Goal Achievement: 09/06/15 Potential to Achieve Goals: Good Progress towards PT goals: Progressing toward goals    Frequency  Min 5X/week    PT Plan Current plan remains appropriate    Co-evaluation             End of Session Equipment Utilized During Treatment:  Gait belt Activity Tolerance: Patient tolerated treatment well;Patient limited by fatigue Patient left: in chair;with call bell/phone within reach     Time: 0921-0939 PT Time Calculation (min) (ACUTE ONLY): 18 min  Charges:  $Gait Training: 8-22 mins                    G Codes:      Cassell Clement, PT, CSCS Pager [phone] Office [phone]  08/26/2015, 11:27 AM

## 2015-08-26 NOTE — Progress Notes (Signed)
Vaughan Basta, IV team nurse aware of order to [facility].

## 2015-08-26 NOTE — Telephone Encounter (Signed)
No revlimid I need to discuss switching Rx when I see her back on 11/2

## 2015-08-26 NOTE — Telephone Encounter (Signed)
Instructed pt to not take Revlimid until discuss w/ Dr. Alvy Bimler on next visit.  She verbalized understanding.   Pt's appt on 11/2 was canceled.   Informed pt I will call her back w/ new schedule.  She is scheduled for Radiation Tx on 11/2 at 5:15 pm.  Will check w/ Dr. Alvy Bimler regarding schedule.

## 2015-08-26 NOTE — Progress Notes (Signed)
Order placed for [employer]. IV team consult in so that IV team member can go to [place]. [place]

## 2015-08-26 NOTE — Clinical Social Work Note (Signed)
CSW presented bed offers and patient chose to go to [facility] for short term rehab at [facility].  Patient to be d/c'ed today to [facility].  Patient and family agreeable to plans will transport via ems RN to call report.  Evette Cristal, MSW, [place]

## 2015-08-26 NOTE — Telephone Encounter (Signed)
Pt states needs refill on Revlimid sent to [facility].  She is due to start next cycle this Thursday 10/27.   Pt is being d/c'd from Hospital today to [facility].  She wants to arrange for Revlimid to be delivered to her home and her family can bring medication to the facility for her.   She asks if Dr. Alvy Bimler wants her to continue Revlimid while she is in Rehab?

## 2015-08-26 NOTE — Discharge Planning (Signed)
No IVs present at time of discarge. Patient has port-a-cath that is not currently accessed. Report called to [facility] at 1310. Patient transported via EMS. All belongings sent with the patient's daughter.

## 2015-08-27 ENCOUNTER — Ambulatory Visit
Admission: RE | Admit: 2015-08-27 | Discharge: 2015-08-27 | Disposition: A | Discharge status: Home / Self Care | Payer: Medicare Other | Source: Ambulatory Visit | Attending: Radiation Oncology | Admitting: Radiation Oncology

## 2015-08-27 ENCOUNTER — Telehealth: Payer: Self-pay | Admitting: *Deleted

## 2015-08-27 ENCOUNTER — Ambulatory Visit: Payer: Medicare Other

## 2015-08-27 NOTE — Telephone Encounter (Signed)
Pt states she will wait until after she completes Radiation to see Dr. Alvy Bimler again.  Informed her we will get her scheduled to see Dr. Alvy Bimler after her Radiation is complete.  Pt had questions about her Radiation Schedule.   I read her schedule to her in EMR but I encouraged her to call Rad Onc Dept to confirm the plan and schedule.  She verbalized understanding.

## 2015-08-27 NOTE — Telephone Encounter (Signed)
Notified [facility] of Revlmid on hold for now.  We will send new Rx in future if restarted.

## 2015-08-27 NOTE — Telephone Encounter (Signed)
[facility] facility and spoke  with Lyn Henri in charge of transportation , faxed schedule for patient to be transported here starting 09/02/15 ,Monday at 1000 for re- Ct simulation  For 1 hour, all appts need to be here 15 before treatment to start on time, she stated she would check to see if she can arrange transportation the days 09/03/15-09/05/15  Late evenings 530,515 and 530pm  Times,she isn't sure will call back with status, informed Melissa RT therapist waiting on call back and may need to reschedule the evening times due to patient being in a skilled nursing facilty 10:33 AM

## 2015-08-28 ENCOUNTER — Telehealth: Payer: Self-pay | Admitting: Family Medicine

## 2015-08-28 ENCOUNTER — Ambulatory Visit: Payer: Medicare Other

## 2015-08-28 NOTE — Telephone Encounter (Signed)
ok 

## 2015-08-28 NOTE — Telephone Encounter (Signed)
Pt needed a rehab fup so I used [facility] 09/10/15 hope this was ok

## 2015-08-29 ENCOUNTER — Encounter: Payer: Self-pay | Admitting: Internal Medicine

## 2015-08-29 ENCOUNTER — Ambulatory Visit: Payer: Medicare Other

## 2015-08-29 ENCOUNTER — Non-Acute Institutional Stay (SKILLED_NURSING_FACILITY): Payer: Medicare Other | Admitting: Internal Medicine

## 2015-08-29 DIAGNOSIS — E034 Atrophy of thyroid (acquired): Secondary | ICD-10-CM

## 2015-08-29 DIAGNOSIS — R52 Pain, unspecified: Secondary | ICD-10-CM

## 2015-08-29 DIAGNOSIS — T50905A Adverse effect of unspecified drugs, medicaments and biological substances, initial encounter: Secondary | ICD-10-CM

## 2015-08-29 DIAGNOSIS — E038 Other specified hypothyroidism: Secondary | ICD-10-CM

## 2015-08-29 DIAGNOSIS — E785 Hyperlipidemia, unspecified: Secondary | ICD-10-CM | POA: Diagnosis not present

## 2015-08-29 DIAGNOSIS — D6959 Other secondary thrombocytopenia: Secondary | ICD-10-CM | POA: Diagnosis not present

## 2015-08-29 DIAGNOSIS — C9000 Multiple myeloma not having achieved remission: Secondary | ICD-10-CM | POA: Diagnosis not present

## 2015-08-29 DIAGNOSIS — M898X5 Other specified disorders of bone, thigh: Secondary | ICD-10-CM | POA: Diagnosis not present

## 2015-08-29 DIAGNOSIS — M899 Disorder of bone, unspecified: Secondary | ICD-10-CM

## 2015-08-29 DIAGNOSIS — I10 Essential (primary) hypertension: Secondary | ICD-10-CM | POA: Diagnosis not present

## 2015-08-29 NOTE — Progress Notes (Signed)
Patient ID: Kendra Johns, female   DOB: 07-01-1951, 64 y.o.   MRN: [id number]    HISTORY AND PHYSICAL   DATE: 08/29/15  Location:  [facility] Room Number: 641 Place of Service: SNF (31)   Extended Emergency Contact Information Primary Emergency Contact: Sinha,Lee Address: [address]          [place], [place] [address] [place] Phone: [phone] Work Phone: [phone] Mobile Phone: [phone] Relation: Spouse Secondary Emergency Contact: Hope,Tara  [place] Phone: [phone] Relation: Daughter  Advanced Directive information Does patient have an advance directive?: No (Full Code)  Chief Complaint  Patient presents with  . New Admit To SNF    following hospitalization 08/21/15 to 08/26/15 for Lytic bone lesion of right femur  . Discharge Note    HPI:  64 yo female seen today as a new admission into SNF following hospital stay for right femur lytic lesion determined to be pathologic fx and is s/p IM nail 10/20th. She has a hx MM s/p BMT in 2013. She also has thrombocytopenia and intractable cancer pain. She reports pain is well controlled on current regimen. and she is ready to go home. She will be d/c'd home after a short rehab stay. She will require HH PT/OT and DME 3-in-1 commode, shower chair and rollator due to unstable gait and physical deconditioning. She is followed by oncology for MM  Past Medical History  Diagnosis Date  . Depression   . Hypertension   . Hepatitis C     genotype 1  . Diverticulosis of colon   . Multiple myeloma     kappa light chain, s/p high-dose chemo/stem cell tx - [facility]  . Hx of adenomatous colonic polyps 2007  . Anxiety   . Osteoarthritis   . Sleep apnea   . Hyperlipidemia   . GERD with stricture   . External hemorrhoids   . Hepatitis B virus infection   . IBS (irritable bowel syndrome)   . Helicobacter pylori gastritis 2001    ? if treated  . Sciatic  nerve pain     with Dr. Earle Gell  . Chronic low back pain   . Unspecified vitamin D deficiency 09/22/2013  . Allergy   . Fever 11/28/2013  . Dehydration 11/28/2013  . Nausea alone 01/22/2014  . Cough 08/02/2014  . DIVERTICULOSIS, COLON 08/31/2006  . DEPRESSION 08/31/2006    Qualifier: Diagnosis of  By: [name], [name]    . Muscle cramp 11/21/2014  . Chronic fatigue 03/07/2015  . Vitamin D deficiency 05/28/2015  . S/P radiation therapy 06/05/15-06/18/15    C7-T1     Past Surgical History  Procedure Laterality Date  . Abdominal hysterectomy    . Oophorectomy    . Dilation and curettage of uterus    . Bone marrow transplant      2009, 2013 [facility]  . Appendectomy    . Colonoscopy w/ polypectomy  08/2006    1-2 adenomas, 6 polyps total, diverticulosis and external hemorrhoids  . Upper gastrointestinal endoscopy  08/2003    esophageal stricture dilation, hiatal hernia, gastrritis  . Cholecystectomy    . Tonsillectomy and adenoidectomy    . Femur im nail Right 08/22/2015    Procedure: INTRAMEDULLARY (IM) NAIL FEMORAL;  Surgeon: [name], MD;  Location: [facility];  Service: Orthopedics;  Laterality: Right;    Patient Care Team: Marin Olp, MD as PCP - General (Family Medicine)  Jeanann Lewandowsky, MD as Consulting Physician (Medical Oncology) Marin Olp, MD as Consulting Physician (Family Medicine) Heath Lark, MD as Consulting Physician (Hematology and Oncology)  Social History   Social History  . Marital Status: Married    Spouse Name: Aleathea Pugmire.  . Number of Children: 2  . Years of Education: N/A   Occupational History  . Security    Social History Main Topics  . Smoking status: Current Every Day Smoker -- 0.25 packs/day for 44 years    Types: Cigarettes  . Smokeless tobacco: Never Used  . Alcohol Use: No  . Drug Use: No  . Sexual Activity: No   Other Topics Concern  . Not on file   Social History Narrative   Lives with husband and grandson.   Ambulates with a walker at baseline.     reports that she has been smoking Cigarettes.  She has a 11 pack-year smoking history. She has never used smokeless tobacco. She reports that she does not drink alcohol or use illicit drugs.  Family History  Problem Relation Age of Onset  . Alcohol abuse    . Lung cancer Father     smoked  . Leukemia Mother   . Cirrhosis Sister   . Colon cancer Neg Hx   . Lung cancer Sister     smoked   Family Status  Relation Status Death Age  . Mother Deceased   . Father Deceased     Immunization History  Administered Date(s) Administered  . Influenza Split 09/01/2011, 08/31/2012  . Influenza Whole 08/21/2009, 08/07/2010  . Influenza,inj,Quad PF,36+ Mos 08/25/2013, 07/10/2014, 08/01/2015  . PPD Test 08/26/2015  . Pneumococcal-Unspecified 08/02/2013  . Td 02/11/2010    Allergies  Allergen Reactions  . Codeine Itching  . Ibuprofen Other (See Comments)    REACTION: gi trouble  . Albuterol Hives    Pt tolerated albuterol neb without issue 12/2014    Medications: Patient's Medications  New Prescriptions   No medications on file  Previous Medications   ASPIRIN 81 MG TABLET    Take 81 mg by mouth daily.   BISACODYL (DULCOLAX) 10 MG SUPPOSITORY    Place 10 mg rectally. If not relieved MOM give suppository rectally one dose in 24 hours as needed for constipation   CHOLECALCIFEROL (VITAMIN D3) 5000 UNITS CAPS    Take by mouth. Take one tablet daily for Vitamin D   CYCLOBENZAPRINE (FLEXERIL) 10 MG TABLET    Take 1 tablet (10 mg total) by mouth 3 (three) times daily as needed for muscle spasms.   DEXAMETHASONE (DECADRON) 4 MG TABLET    TAKE 1 TABLET (4 MG TOTAL) BY MOUTH DAILY.   ENOXAPARIN (LOVENOX) 40 MG/0.4ML INJECTION    Inject 0.4 mLs (40 mg total) into the skin daily.   IPRATROPIUM (ATROVENT HFA) 17 MCG/ACT INHALER    Inhale 2 puffs into the lungs every 4 (four) hours as needed for wheezing.   LENALIDOMIDE (REVLIMID) 25 MG CAPSULE    Take 25 mg  by mouth. One capsule for 21 days then stop for 7 days   LENALIDOMIDE (REVLIMID) 5 MG CAPSULE    Take 5 mg by mouth. Take 5 capsules = 25 mg once daily for 3 weeks then 7 days off stop 09/10/15   LEVOTHYROXINE (SYNTHROID) 50 MCG TABLET    Take 1 tablet (50 mcg total) by mouth daily before breakfast.   LIDOCAINE-PRILOCAINE (EMLA) CREAM    Apply 1 application topically as needed.   MAGIC  MOUTHWASH SOLN    Take 10 mLs by mouth 4 (four) times daily as needed for mouth pain. Swish and spit  Or  Swish and swallow.   MAGNESIUM HYDROXIDE (MILK OF MAGNESIA) 400 MG/5ML SUSPENSION    Take 30 mLs by mouth daily as needed for mild constipation (constipation).    METOPROLOL (LOPRESSOR) 100 MG TABLET    Take 1 tablet (100 mg total) by mouth 2 (two) times daily.   MORPHINE (MS CONTIN) 15 MG 12 HR TABLET    Take 5 tablets (75 mg total) by mouth every 8 (eight) hours.   MORPHINE (MSIR) 30 MG TABLET    Take 1-1.5 tablets (30-45 mg total) by mouth every 2 (two) hours as needed for moderate pain or severe pain.   SENNA-DOCUSATE (SENOKOT-S) 8.6-50 MG TABLET    Take 1 tablet by mouth daily.   SODIUM PHOSPHATES (RA SALINE ENEMA RE)    Place rectally. If not relieved by suppository give enema rectally one dose in 24 hours as needed for constipation   SUCRALFATE (CARAFATE) 1 G TABLET    Take 1 tablet (1 g total) by mouth 4 (four) times daily.  Modified Medications   No medications on file  Discontinued Medications   CHOLECALCIFEROL (VITAMIN D3) 5000 UNITS TABS    Take 5,000 mg by mouth every morning.   ERGOCALCIFEROL (VITAMIN D2) 50000 UNITS CAPSULE    Take 1 capsule (50,000 Units total) by mouth once a week.   LOSARTAN (COZAAR) 50 MG TABLET    TAKE 1/2 TO 1 TABLET DAILY    Review of Systems  Constitutional: Positive for fatigue. Negative for fever, chills, diaphoresis, activity change and appetite change.  HENT: Negative for ear pain and sore throat.   Eyes: Negative for visual disturbance.  Respiratory: Negative for  cough, chest tightness and shortness of breath.   Cardiovascular: Negative for chest pain, palpitations and leg swelling.  Gastrointestinal: Negative for nausea, vomiting, abdominal pain, diarrhea, constipation and blood in stool.  Genitourinary: Negative for dysuria.  Musculoskeletal: Positive for arthralgias and gait problem.  Neurological: Negative for dizziness, tremors, weakness, numbness and headaches.  Psychiatric/Behavioral: Negative for sleep disturbance. The patient is not nervous/anxious.     Filed Vitals:   08/29/15 1608  BP: 109/54  Pulse: 52  Temp: 98 F (36.7 C)  TempSrc: Oral  Resp: 17  Height: _0  (1.626 m)  Weight: 146 lb (66.225 kg)   Body mass index is 25.05 kg/(m^2).  Physical Exam  Constitutional: She is oriented to person, place, and time. She appears well-developed. No distress.  Sitting on bed in NAD. frail appearing.  Daughter present  HENT:  Mouth/Throat: Oropharynx is clear and moist. No oropharyngeal exudate.  Eyes: Pupils are equal, round, and reactive to light. No scleral icterus.  Neck: Neck supple. Carotid bruit is not present. No tracheal deviation present. No thyromegaly present.  Cardiovascular: Normal rate, regular rhythm, normal heart sounds and intact distal pulses.  Exam reveals no gallop and no friction rub.   No murmur heard. R>LLE +1 pitting edema  Pulmonary/Chest: Effort normal and breath sounds normal. No stridor. No respiratory distress. She has no wheezes. She has no rales.  Abdominal: Soft. Bowel sounds are normal. She exhibits no distension and no mass. There is no hepatomegaly. There is tenderness (bruising at lovenox injection site). There is no rebound and no guarding.  Musculoskeletal: She exhibits edema and tenderness.  Right lateral thigh incision intact and no signs of secondary infection. Steri strips intact  Lymphadenopathy:    She has no cervical adenopathy.  Neurological: She is alert and oriented to person, place,  and time.  Skin: Skin is warm and dry. No rash noted.  bruising at lovenox injection sites  Psychiatric: She has a normal mood and affect. Her behavior is normal. Thought content normal.     Labs reviewed: Admission on 08/21/2015, Discharged on 08/26/2015  Component Date Value Ref Range Status  . Sodium 08/21/2015 140  135 - 145 mmol/L Final  . Potassium 08/21/2015 3.9  3.5 - 5.1 mmol/L Final  . Chloride 08/21/2015 107  101 - 111 mmol/L Final  . CO2 08/21/2015 25  22 - 32 mmol/L Final  . Glucose, Bld 08/21/2015 120* 65 - 99 mg/dL Final  . BUN 08/21/2015 14  6 - 20 mg/dL Final  . Creatinine, Ser 08/21/2015 0.69  0.44 - 1.00 mg/dL Final  . Calcium 08/21/2015 9.2  8.9 - 10.3 mg/dL Final  . GFR calc non Af Amer 08/21/2015 >60  >60 mL/min Final  . GFR calc Af Amer 08/21/2015 >60  >60 mL/min Final   Comment: (NOTE) The eGFR has been calculated using the CKD EPI equation. This calculation has not been validated in all clinical situations. eGFR's persistently <60 mL/min signify possible Chronic Kidney Disease.   . Anion gap 08/21/2015 8  5 - 15 Final  . WBC 08/21/2015 5.9  4.0 - 10.5 K/uL Final  . RBC 08/21/2015 4.17  3.87 - 5.11 MIL/uL Final  . Hemoglobin 08/21/2015 13.2  12.0 - 15.0 g/dL Final  . HCT 08/21/2015 39.1  36.0 - 46.0 % Final  . MCV 08/21/2015 93.8  78.0 - 100.0 fL Final  . MCH 08/21/2015 31.7  26.0 - 34.0 pg Final  . MCHC 08/21/2015 33.8  30.0 - 36.0 g/dL Final  . RDW 08/21/2015 17.0* 11.5 - 15.5 % Final  . Platelets 08/21/2015 146* 150 - 400 K/uL Final  . Neutrophils Relative % 08/21/2015 69   Final  . Neutro Abs 08/21/2015 4.0  1.7 - 7.7 K/uL Final  . Lymphocytes Relative 08/21/2015 13   Final  . Lymphs Abs 08/21/2015 0.7  0.7 - 4.0 K/uL Final  . Monocytes Relative 08/21/2015 16   Final  . Monocytes Absolute 08/21/2015 1.0  0.1 - 1.0 K/uL Final  . Eosinophils Relative 08/21/2015 2   Final  . Eosinophils Absolute 08/21/2015 0.1  0.0 - 0.7 K/uL Final  . Basophils  Relative 08/21/2015 0   Final  . Basophils Absolute 08/21/2015 0.0  0.0 - 0.1 K/uL Final  . MRSA, PCR 08/22/2015 NEGATIVE  NEGATIVE Final  . Staphylococcus aureus 08/22/2015 NEGATIVE  NEGATIVE Final   Comment:        The Xpert SA Assay (FDA approved for NASAL specimens in patients over 32 years of age), is one component of a comprehensive surveillance program.  Test performance has been validated by [facility] for patients greater than or equal to 64 year old. It is not intended to diagnose infection nor to guide or monitor treatment.   . Sodium 08/23/2015 138  135 - 145 mmol/L Final  . Potassium 08/23/2015 4.0  3.5 - 5.1 mmol/L Final  . Chloride 08/23/2015 104  101 - 111 mmol/L Final  . CO2 08/23/2015 24  22 - 32 mmol/L Final  . Glucose, Bld 08/23/2015 127* 65 - 99 mg/dL Final  . BUN 08/23/2015 13  6 - 20 mg/dL Final  . Creatinine, Ser 08/23/2015 0.73  0.44 - 1.00 mg/dL Final  .  Calcium 08/23/2015 8.6* 8.9 - 10.3 mg/dL Final  . GFR calc non Af Amer 08/23/2015 >60  >60 mL/min Final  . GFR calc Af Amer 08/23/2015 >60  >60 mL/min Final   Comment: (NOTE) The eGFR has been calculated using the CKD EPI equation. This calculation has not been validated in all clinical situations. eGFR's persistently <60 mL/min signify possible Chronic Kidney Disease.   . Anion gap 08/23/2015 10  5 - 15 Final  . WBC 08/23/2015 5.6  4.0 - 10.5 K/uL Final  . RBC 08/23/2015 3.87  3.87 - 5.11 MIL/uL Final  . Hemoglobin 08/23/2015 12.0  12.0 - 15.0 g/dL Final  . HCT 08/23/2015 36.2  36.0 - 46.0 % Final  . MCV 08/23/2015 93.5  78.0 - 100.0 fL Final  . MCH 08/23/2015 31.0  26.0 - 34.0 pg Final  . MCHC 08/23/2015 33.1  30.0 - 36.0 g/dL Final  . RDW 08/23/2015 16.8* 11.5 - 15.5 % Final  . Platelets 08/23/2015 141* 150 - 400 K/uL Final  . WBC 08/22/2015 6.6  4.0 - 10.5 K/uL Final  . RBC 08/22/2015 4.14  3.87 - 5.11 MIL/uL Final  . Hemoglobin 08/22/2015 13.0  12.0 - 15.0 g/dL Final  . HCT 08/22/2015 39.1   36.0 - 46.0 % Final  . MCV 08/22/2015 94.4  78.0 - 100.0 fL Final  . MCH 08/22/2015 31.4  26.0 - 34.0 pg Final  . MCHC 08/22/2015 33.2  30.0 - 36.0 g/dL Final  . RDW 08/22/2015 16.9* 11.5 - 15.5 % Final  . Platelets 08/22/2015 136* 150 - 400 K/uL Final  . Creatinine, Ser 08/22/2015 0.76  0.44 - 1.00 mg/dL Final  . GFR calc non Af Amer 08/22/2015 >60  >60 mL/min Final  . GFR calc Af Amer 08/22/2015 >60  >60 mL/min Final   Comment: (NOTE) The eGFR has been calculated using the CKD EPI equation. This calculation has not been validated in all clinical situations. eGFR's persistently <60 mL/min signify possible Chronic Kidney Disease.   . Glucose-Capillary 08/24/2015 154* 65 - 99 mg/dL Final  . Glucose-Capillary 08/24/2015 155* 65 - 99 mg/dL Final  . Comment 1 08/24/2015 Notify RN   Final  Appointment on 08/07/2015  Component Date Value Ref Range Status  . WBC 08/07/2015 6.5  3.9 - 10.3 10e3/uL Final  . NEUT# 08/07/2015 3.9  1.5 - 6.5 10e3/uL Final  . HGB 08/07/2015 14.1  11.6 - 15.9 g/dL Final  . HCT 08/07/2015 41.6  34.8 - 46.6 % Final  . Platelets 08/07/2015 162  145 - 400 10e3/uL Final  . MCV 08/07/2015 92.4  79.5 - 101.0 fL Final  . MCH 08/07/2015 31.3  25.1 - 34.0 pg Final  . MCHC 08/07/2015 33.9  31.5 - 36.0 g/dL Final  . RBC 08/07/2015 4.50  3.70 - 5.45 10e6/uL Final  . RDW 08/07/2015 17.0* 11.2 - 14.5 % Final  . lymph# 08/07/2015 1.4  0.9 - 3.3 10e3/uL Final  . MONO# 08/07/2015 0.8  0.1 - 0.9 10e3/uL Final  . Eosinophils Absolute 08/07/2015 0.4  0.0 - 0.5 10e3/uL Final  . Basophils Absolute 08/07/2015 0.1  0.0 - 0.1 10e3/uL Final  . NEUT% 08/07/2015 59.1  38.4 - 76.8 % Final  . LYMPH% 08/07/2015 21.3  14.0 - 49.7 % Final  . MONO% 08/07/2015 12.4  0.0 - 14.0 % Final  . EOS% 08/07/2015 6.1  0.0 - 7.0 % Final  . BASO% 08/07/2015 1.1  0.0 - 2.0 % Final  . nRBC 08/07/2015 1*  0 - 0 % Final  . Sodium 08/07/2015 140  136 - 145 mEq/L Final  . Potassium 08/07/2015 3.3* 3.5 - 5.1  mEq/L Final  . Chloride 08/07/2015 109  98 - 109 mEq/L Final  . CO2 08/07/2015 23  22 - 29 mEq/L Final  . Glucose 08/07/2015 106  70 - 140 mg/dl Final   Glucose reference range is for nonfasting patients. Fasting glucose reference range is 70- 100.  Marland Kitchen BUN 08/07/2015 11.8  7.0 - 26.0 mg/dL Final  . Creatinine 08/07/2015 0.8  0.6 - 1.1 mg/dL Final  . Total Bilirubin 08/07/2015 0.39  0.20 - 1.20 mg/dL Final  . Alkaline Phosphatase 08/07/2015 100  40 - 150 U/L Final  . AST 08/07/2015 12  5 - 34 U/L Final  . ALT 08/07/2015 13  0 - 55 U/L Final  . Total Protein 08/07/2015 6.2* 6.4 - 8.3 g/dL Final  . Albumin 08/07/2015 3.6  3.5 - 5.0 g/dL Final  . Calcium 08/07/2015 9.1  8.4 - 10.4 mg/dL Final  . Anion Gap 08/07/2015 9  3 - 11 mEq/L Final  . EGFR 08/07/2015 >90  >90 ml/min/1.73 m2 Final   eGFR is calculated using the CKD-EPI Creatinine Equation (2009)  . Technologist Review 08/07/2015 Metas and Myelocytes, occ variant lymph present   Final  Appointment on 07/24/2015  Component Date Value Ref Range Status  . WBC 07/24/2015 3.4* 3.9 - 10.3 10e3/uL Final  . NEUT# 07/24/2015 1.9  1.5 - 6.5 10e3/uL Final  . HGB 07/24/2015 13.4  11.6 - 15.9 g/dL Final  . HCT 07/24/2015 40.6  34.8 - 46.6 % Final  . Platelets 07/24/2015 86* 145 - 400 10e3/uL Final  . MCV 07/24/2015 95.3  79.5 - 101.0 fL Final  . MCH 07/24/2015 31.5  25.1 - 34.0 pg Final  . MCHC 07/24/2015 33.0  31.5 - 36.0 g/dL Final  . RBC 07/24/2015 4.26  3.70 - 5.45 10e6/uL Final  . RDW 07/24/2015 18.5* 11.2 - 14.5 % Final  . lymph# 07/24/2015 0.7* 0.9 - 3.3 10e3/uL Final  . MONO# 07/24/2015 0.5  0.1 - 0.9 10e3/uL Final  . Eosinophils Absolute 07/24/2015 0.2  0.0 - 0.5 10e3/uL Final  . Basophils Absolute 07/24/2015 0.0  0.0 - 0.1 10e3/uL Final  . NEUT% 07/24/2015 56.1  38.4 - 76.8 % Final  . LYMPH% 07/24/2015 21.2  14.0 - 49.7 % Final  . MONO% 07/24/2015 15.2* 0.0 - 14.0 % Final  . EOS% 07/24/2015 6.6  0.0 - 7.0 % Final  . BASO% 07/24/2015  0.9  0.0 - 2.0 % Final  . Sodium 07/24/2015 142  136 - 145 mEq/L Final  . Potassium 07/24/2015 3.7  3.5 - 5.1 mEq/L Final  . Chloride 07/24/2015 108  98 - 109 mEq/L Final  . CO2 07/24/2015 26  22 - 29 mEq/L Final  . Glucose 07/24/2015 96  70 - 140 mg/dl Final   Glucose reference range is for nonfasting patients. Fasting glucose reference range is 70- 100.  Marland Kitchen BUN 07/24/2015 11.2  7.0 - 26.0 mg/dL Final  . Creatinine 07/24/2015 0.7  0.6 - 1.1 mg/dL Final  . Total Bilirubin 07/24/2015 0.53  0.20 - 1.20 mg/dL Final  . Alkaline Phosphatase 07/24/2015 92  40 - 150 U/L Final  . AST 07/24/2015 13  5 - 34 U/L Final  . ALT 07/24/2015 17  0 - 55 U/L Final  . Total Protein 07/24/2015 5.8* 6.4 - 8.3 g/dL Final  . Albumin 07/24/2015 3.3* 3.5 -  5.0 g/dL Final  . Calcium 07/24/2015 8.7  8.4 - 10.4 mg/dL Final  . Anion Gap 07/24/2015 7  3 - 11 mEq/L Final  . EGFR 07/24/2015 >90  >90 ml/min/1.73 m2 Final   eGFR is calculated using the CKD-EPI Creatinine Equation (2009)  . Technologist Review 07/24/2015 2% Myelos   Final  Appointment on 07/10/2015  Component Date Value Ref Range Status  . WBC 07/10/2015 5.7  3.9 - 10.3 10e3/uL Final  . NEUT# 07/10/2015 3.8  1.5 - 6.5 10e3/uL Final  . HGB 07/10/2015 13.9  11.6 - 15.9 g/dL Final  . HCT 07/10/2015 41.7  34.8 - 46.6 % Final  . Platelets 07/10/2015 119* 145 - 400 10e3/uL Final  . MCV 07/10/2015 93.5  79.5 - 101.0 fL Final  . MCH 07/10/2015 31.2  25.1 - 34.0 pg Final  . MCHC 07/10/2015 33.3  31.5 - 36.0 g/dL Final  . RBC 07/10/2015 4.46  3.70 - 5.45 10e6/uL Final  . RDW 07/10/2015 18.3* 11.2 - 14.5 % Final  . lymph# 07/10/2015 1.1  0.9 - 3.3 10e3/uL Final  . MONO# 07/10/2015 0.5  0.1 - 0.9 10e3/uL Final  . Eosinophils Absolute 07/10/2015 0.1  0.0 - 0.5 10e3/uL Final  . Basophils Absolute 07/10/2015 0.1  0.0 - 0.1 10e3/uL Final  . NEUT% 07/10/2015 67.7  38.4 - 76.8 % Final  . LYMPH% 07/10/2015 19.5  14.0 - 49.7 % Final  . MONO% 07/10/2015 9.4  0.0 - 14.0  % Final  . EOS% 07/10/2015 2.5  0.0 - 7.0 % Final  . BASO% 07/10/2015 0.9  0.0 - 2.0 % Final  . IgG (Immunoglobin G), Serum 07/10/2015 587* 690 - 1700 mg/dL Final  . IgA 07/10/2015 45* 69 - 380 mg/dL Final  . IgM, Serum 07/10/2015 26* 52 - 322 mg/dL Final  . Immunofix Electr Int 07/10/2015 *   Final   Area of slightly restricted mobility in the IgG and Kappa lanes.Suggest repeat in 6-8 months, if clinically indicated. Reviewed by Odis Hollingshead, MD, PhD, FCAP (Electronic Signature onFile)  . Total Protein, Serum Electrophores* 07/10/2015 6.0* 6.1 - 8.1 g/dL Final  . Albumin ELP 07/10/2015 3.7* 3.8 - 4.8 g/dL Final  . Alpha-1-Globulin 07/10/2015 0.4* 0.2 - 0.3 g/dL Final  . Alpha-2-Globulin 07/10/2015 0.8  0.5 - 0.9 g/dL Final  . Beta Globulin 07/10/2015 0.4  0.4 - 0.6 g/dL Final  . Beta 2 07/10/2015 0.2  0.2 - 0.5 g/dL Final  . Gamma Globulin 07/10/2015 0.5* 0.8 - 1.7 g/dL Final  . Abnormal Protein Band1 07/10/2015 NOT DET   Final  . SPE Interp. 07/10/2015 *   Final   The possibility of a faint restricted band(s) cannot be completelyexcluded in the gamma region.Results are consistent with SPE performed on 05/27/2015 Reviewed by Odis Hollingshead, MD, PhD, FCAP (Electronic Signature onFile)  . COMMENT (PROTEIN ELECTROPHOR) 07/10/2015 *   Final   Comment: ---------------Serum protein electrophoresis is a useful screening procedure in thedetection of various pathophysiologic states such as inflammation,gammopathies, protein loss and other dysproteinemias.  Immunofixationelectrophoresis (IFE) is a more  sensitive technique for theidentification of M-proteins found in patients with monoclonalgammopathy of unknown significance (MGUS), amyloidosis, early ortreated myeloma or macroglobulinemia, solitary plasmacytoma orextramedullary plasmacytoma.   . Abnormal Protein Band2 07/10/2015 NOT DET   Final  . Abnormal Protein Band3 07/10/2015 NOT DET   Final  . Kappa free light chain 07/10/2015 57.10*  0.33 - 1.94 mg/dL Final  . Lambda Free Lght Chn 07/10/2015 0.48* 0.57 -  2.63 mg/dL Final  . Kappa:Lambda Ratio 07/10/2015 118.96* 0.26 - 1.65 Final  . Beta-2 Microglobulin 07/10/2015 1.67  <=2.51 mg/L Final  . Technologist Review 07/10/2015 Occassional metas   Final    Pelvis Portable  08/22/2015  CLINICAL DATA:  Right Hip fracture requiring operative repair ([place]), post op EXAM: PORTABLE PELVIS 1-2 VIEWS COMPARISON:  08/22/2015 FINDINGS: Status post right IM nail/lag screw fixation of right sub trochanteric pathologic hip fracture. Femoral head appears located in the acetabulum. Previous vertebroplasty. IMPRESSION: Status post ORIF right hip. Electronically Signed   By: Nolon Nations M.D.   On: 08/22/2015 17:13   Dg C-arm 1-60 Min  08/22/2015  CLINICAL DATA:  Multiple myeloma, with a pathologic fracture. RIGHT leg pain. EXAM: RIGHT FEMUR 2 VIEWS; DG C-ARM 61-120 MIN COMPARISON:  Plain films most recent 08/21/2015. FINDINGS: Intra medullary rod with compression screw has been placed across the proximal femoral subtrochanteric pathologic fracture. Satisfactory position and alignment. IMPRESSION: As above. Electronically Signed   By: Staci Righter M.D.   On: 08/22/2015 16:31   Mr Total Spine Mets Screening  08/01/2015  CLINICAL DATA:  64 year old female with multiple myeloma, spine pain radiating to the right hip and lower extremity for 3 weeks. Right lower extremity weakness and numbness. Chemotherapy. Palliative radiation therapy to C7-T1 completed in August. Subsequent encounter. EXAM: MRI TOTAL SPINE WITHOUT AND WITH CONTRAST TECHNIQUE: Multisequence MR imaging of the spine from the cervical spine to the sacrum was performed prior to and following IV contrast administration for evaluation of spinal metastatic disease. CONTRAST:  13 mL MultiHance COMPARISON:  Total spine exam 05/27/2015. FINDINGS: Cervical Findings: Grossly negative visualized brain parenchyma. Skullbase through C3 levels appear  stable and without tumor. Stable C4 compression fracture, no marrow edema or enhancement. C5 and C6 levels appear stable and without tumor. C7 level described in the thoracic section below. No malignant cervical spinal stenosis. No cervical spinal cord signal abnormality. No cervical spine intradural enhancement. Visible paraspinal soft tissues appear stable without tumor. Thoracic Findings: Improved appearance of T1 status post radiation. Regressed expansile tumor. Severe compression of the T1 vertebra but no retropulsion. Continued marrow edema and enhancement not involving the posterior elements. The adjacent C7 inferior endplate appears mildly heterogeneous but without associated enhancement to strongly suggest tumor. The C7 level otherwise remains within normal limits. Heterogeneity of the T2 and T3 vertebral bodies is stable with patchy enhancement (T3 posterior inferior vertebral body series 13, image 6). Right T7 rib tumor partially visible on series 5, image 3 and appears stable to mildly regressed (14-15 mm diameter now versus 17 mm in July). Otherwise no definite tumor T4 through T9. Posterior element tumor at T10 with marrow edema and enhancement has not significantly changed. No epidural or paraspinal extension. No tumor identified at T11 or T12. No thoracic spinal stenosis, spinal cord signal abnormality, or abnormal dural thickening/ enhancement. Lumbar Findings: Progressed L1 vertebral body tumor, subtotal vertebral body involvement now (series 9, image 6). Early pedicle involvement bilaterally. No posterior element involvement. No epidural or paraspinal extension. Posterior inferior L3 vertebral body tumor appears stable since July. No epidural or paraspinal extension. Treated L5 pathologic fracture appears stable with mild residual edema and enhancement. No tumor identified at L2 or L4. No central sacral tumor identified. No malignant lumbar spinal stenosis. Cauda equina nerve roots appear within  normal limits. IMPRESSION: 1. Stable cervical spine, no definite involvement by myeloma. 2. Improved appearance of T1 status post radiation. Right T7 rib tumor also appears mildly  regressed. 3. Stable lesser myelomatous involvement at T2, T3 and T10. 4. Mild progression of L1 vertebral body tumor. No epidural or paraspinal involvement. 5. L3 vertebral body involvement and treated pathologic fracture at L5 are stable. 6. No malignant spinal stenosis. No spinal cord or cauda equina abnormality. Electronically Signed   By: Genevie Ann M.D.   On: 08/01/2015 14:47   Dg Hip Unilat With Pelvis 2-3 Views Right  08/14/2015  CLINICAL DATA:  Pain.  Multiple myeloma. EXAM: DG HIP (WITH OR WITHOUT PELVIS) 2-3V RIGHT COMPARISON:  None. FINDINGS: Degenerative changes lumbar spine and both hips. Diffuse progress osteopenia. This may be secondary to myeloma. Prominent lytic lesions proximal right femoral shaft. Scratched lytic lesion right iliac wing cannot be excluded. Methylmethacrylate again noted L5 and in the right sacral wing. Pelvic calcifications consistent phleboliths . IMPRESSION: Progressive diffuse osteopenia with lytic lesions in the right proximal femur and and possibly the right iliac wing. Multiple myeloma could present in this fashion . No evidence of fracture. The proximal right femoral shaft lytic lesion is prominent and would be prone to fracture. Orthopedic/radiation therapy consultation should be considered . Electronically Signed   By: Marcello Moores  Register   On: 08/14/2015 13:38   Dg Femur 1v Right  08/21/2015  CLINICAL DATA:  Multiple myeloma, known RIGHT femoral tumor, increased pain for 1 day EXAM: RIGHT FEMUR 1 VIEW COMPARISON:  08/14/2015 FINDINGS: Osseous demineralization. Poorly defined lytic lesions are identified at the proximal and proximal/mid RIGHT femoral diaphysis compatible with multiple myeloma. Endosteal cortical thinning at the larger more proximal femoral lytic lesion. A small amount of  periosteal reaction is identified adjacent to the more cranial of the proximal femoral lesions, located in the subtrochanteric region. Additionally, subtle linear cortical lucency is seen in the medial cortex of the proximal RIGHT femur inferior to the lesser trochanter likely representing a developing pathologic fracture. Tiny rounded lytic lesion of the distal RIGHT femoral metaphysis is noted. Knee and hip joint alignments normal. No additional fracture, dislocation or bone destruction. IMPRESSION: Lytic lesions within the RIGHT femoral diaphysis compatible with multiple myeloma. Periosteal reaction and subtle cortical linear lucency at thinned medial cortex of the proximal RIGHT femoral diaphysis inferior to the lesser trochanter likely representing a developing pathologic fracture. Electronically Signed   By: Lavonia Dana M.D.   On: 08/21/2015 16:45   Dg Femur, Min 2 Views Right  08/22/2015  CLINICAL DATA:  Multiple myeloma, with a pathologic fracture. RIGHT leg pain. EXAM: RIGHT FEMUR 2 VIEWS; DG C-ARM 61-120 MIN COMPARISON:  Plain films most recent 08/21/2015. FINDINGS: Intra medullary rod with compression screw has been placed across the proximal femoral subtrochanteric pathologic fracture. Satisfactory position and alignment. IMPRESSION: As above. Electronically Signed   By: Staci Righter M.D.   On: 08/22/2015 16:31   Dg Femur, Min 2 Views Right  08/14/2015  CLINICAL DATA:  Two weeks of right knee pain without reported trauma; multiple myeloma not in remission, currently on chemotherapy. EXAM: RIGHT FEMUR 2 VIEWS COMPARISON:  Images of the right femur and right knee dated May 24, 2014 FINDINGS: There is a5 mm diameter well-circumscribed lucency in the distal third of shaft of the right femur which is stable. Lucencies previously demonstrated more inferiorly are less well defined and may have increased in size. No cortical disruption is observed. The knee joint compartments appear reasonably well  maintained. There is beaking of the tibial spines and tiny spurs from the articular margins of the patella. IMPRESSION: Lucencies in the  distal third of the femur which likely reflect myelomatous involvement. There are mild degenerative changes of the right knee. Electronically Signed   By: David  Martinique M.D.   On: 08/14/2015 13:37     Assessment/Plan   ICD-9-CM ICD-10-CM   1. Lytic bone lesion of right femur s./p IM nail for pathologic fx 733.99 M89.8X5   2. Multiple myeloma not having achieved remission (HCC) s/p BMT 203.00 C90.00   3. Thrombocytopenia due to drugs 287.49 D69.59    E980.5    4. Intractable pain due to cancer 780.96 R52   5. Hypothyroidism due to acquired atrophy of thyroid - stable 244.8 E03.8    246.8 E03.4   6. Hyperlipidemia - stable 272.4 E78.5   7. Essential hypertension  - stable 401.9 I10    F/u with Oncology as scheduled  F/u with Ortho as scheduled  Cont current meds as ordered  Patient is being discharged with home health services:  PT/OT  Patient is being discharged with the following durable medical equipment:  Shower chair, 3-in-1 commode, rollator  Patient has been advised to f/u with their PCP in 1-2 weeks to bring them up to date on their rehab stay.  They were provided with a 30 day supply of scripts for prescription medications and refills must be obtained from their PCP.  TIME SPENT (MINUTES): [place]. Perlie Gold  [facility] [address] [place], [place] [address] [phone] Cell (Monday-Friday 8 AM - 5 PM) [phone] After 5 PM and follow prompts

## 2015-08-30 ENCOUNTER — Ambulatory Visit: Payer: Medicare Other

## 2015-09-01 DIAGNOSIS — G893 Neoplasm related pain (acute) (chronic): Secondary | ICD-10-CM | POA: Diagnosis not present

## 2015-09-01 DIAGNOSIS — I10 Essential (primary) hypertension: Secondary | ICD-10-CM | POA: Diagnosis not present

## 2015-09-01 DIAGNOSIS — C9000 Multiple myeloma not having achieved remission: Secondary | ICD-10-CM | POA: Diagnosis not present

## 2015-09-01 DIAGNOSIS — M810 Age-related osteoporosis without current pathological fracture: Secondary | ICD-10-CM | POA: Diagnosis not present

## 2015-09-01 DIAGNOSIS — F17210 Nicotine dependence, cigarettes, uncomplicated: Secondary | ICD-10-CM | POA: Diagnosis not present

## 2015-09-01 DIAGNOSIS — M84551D Pathological fracture in neoplastic disease, right femur, subsequent encounter for fracture with routine healing: Secondary | ICD-10-CM | POA: Diagnosis not present

## 2015-09-02 ENCOUNTER — Ambulatory Visit
Admission: RE | Admit: 2015-09-02 | Discharge: 2015-09-02 | Disposition: A | Discharge status: Home / Self Care | Payer: Medicare Other | Source: Ambulatory Visit | Attending: Radiation Oncology | Admitting: Radiation Oncology

## 2015-09-02 ENCOUNTER — Ambulatory Visit: Payer: Medicare Other

## 2015-09-02 DIAGNOSIS — C9002 Multiple myeloma in relapse: Secondary | ICD-10-CM | POA: Diagnosis not present

## 2015-09-03 ENCOUNTER — Telehealth: Payer: Self-pay | Admitting: Family Medicine

## 2015-09-03 ENCOUNTER — Ambulatory Visit: Payer: Medicare Other

## 2015-09-03 DIAGNOSIS — M810 Age-related osteoporosis without current pathological fracture: Secondary | ICD-10-CM | POA: Diagnosis not present

## 2015-09-03 DIAGNOSIS — I10 Essential (primary) hypertension: Secondary | ICD-10-CM | POA: Diagnosis not present

## 2015-09-03 DIAGNOSIS — G893 Neoplasm related pain (acute) (chronic): Secondary | ICD-10-CM | POA: Diagnosis not present

## 2015-09-03 DIAGNOSIS — M84551D Pathological fracture in neoplastic disease, right femur, subsequent encounter for fracture with routine healing: Secondary | ICD-10-CM | POA: Diagnosis not present

## 2015-09-03 DIAGNOSIS — F17210 Nicotine dependence, cigarettes, uncomplicated: Secondary | ICD-10-CM | POA: Diagnosis not present

## 2015-09-03 DIAGNOSIS — C9000 Multiple myeloma not having achieved remission: Secondary | ICD-10-CM | POA: Diagnosis not present

## 2015-09-03 NOTE — Telephone Encounter (Signed)
Called and lm on Santiago Glad vm to provide VO.

## 2015-09-03 NOTE — Telephone Encounter (Signed)
Santiago Glad OT would like verbal order for occupational therapy for this pt. One  time a wk for 2 wks .

## 2015-09-04 ENCOUNTER — Ambulatory Visit: Payer: Self-pay

## 2015-09-04 ENCOUNTER — Ambulatory Visit: Payer: Self-pay | Admitting: Hematology and Oncology

## 2015-09-04 ENCOUNTER — Telehealth: Payer: Self-pay | Admitting: Hematology and Oncology

## 2015-09-04 ENCOUNTER — Ambulatory Visit: Payer: Medicare Other

## 2015-09-04 ENCOUNTER — Ambulatory Visit: Payer: Medicare Other | Admitting: Radiation Oncology

## 2015-09-04 ENCOUNTER — Other Ambulatory Visit: Payer: Self-pay

## 2015-09-04 DIAGNOSIS — S7221XD Displaced subtrochanteric fracture of right femur, subsequent encounter for closed fracture with routine healing: Secondary | ICD-10-CM | POA: Diagnosis not present

## 2015-09-04 NOTE — Telephone Encounter (Signed)
lvm for pt regarding to NOV appts... °

## 2015-09-05 ENCOUNTER — Ambulatory Visit: Payer: Medicare Other

## 2015-09-05 ENCOUNTER — Inpatient Hospital Stay (HOSPITAL_COMMUNITY): Payer: Medicare Other

## 2015-09-05 ENCOUNTER — Encounter (HOSPITAL_COMMUNITY): Payer: Self-pay | Admitting: Emergency Medicine

## 2015-09-05 ENCOUNTER — Inpatient Hospital Stay (HOSPITAL_COMMUNITY)
Admission: EM | Admit: 2015-09-05 | Discharge: 2015-09-06 | DRG: 565 | Disposition: A | Discharge status: Home Health | Payer: Medicare Other | Attending: Internal Medicine | Admitting: Internal Medicine

## 2015-09-05 ENCOUNTER — Ambulatory Visit: Payer: Medicare Other | Admitting: Radiation Oncology

## 2015-09-05 ENCOUNTER — Emergency Department (HOSPITAL_COMMUNITY): Payer: Medicare Other

## 2015-09-05 DIAGNOSIS — Z9481 Bone marrow transplant status: Secondary | ICD-10-CM

## 2015-09-05 DIAGNOSIS — R52 Pain, unspecified: Secondary | ICD-10-CM | POA: Diagnosis not present

## 2015-09-05 DIAGNOSIS — D649 Anemia, unspecified: Secondary | ICD-10-CM | POA: Diagnosis present

## 2015-09-05 DIAGNOSIS — F419 Anxiety disorder, unspecified: Secondary | ICD-10-CM | POA: Diagnosis present

## 2015-09-05 DIAGNOSIS — D6481 Anemia due to antineoplastic chemotherapy: Secondary | ICD-10-CM | POA: Diagnosis not present

## 2015-09-05 DIAGNOSIS — E785 Hyperlipidemia, unspecified: Secondary | ICD-10-CM | POA: Diagnosis present

## 2015-09-05 DIAGNOSIS — S72354A Nondisplaced comminuted fracture of shaft of right femur, initial encounter for closed fracture: Secondary | ICD-10-CM | POA: Diagnosis not present

## 2015-09-05 DIAGNOSIS — D6189 Other specified aplastic anemias and other bone marrow failure syndromes: Secondary | ICD-10-CM | POA: Diagnosis not present

## 2015-09-05 DIAGNOSIS — M545 Low back pain: Secondary | ICD-10-CM | POA: Diagnosis not present

## 2015-09-05 DIAGNOSIS — S32601A Unspecified fracture of right ischium, initial encounter for closed fracture: Secondary | ICD-10-CM

## 2015-09-05 DIAGNOSIS — C9002 Multiple myeloma in relapse: Secondary | ICD-10-CM | POA: Diagnosis present

## 2015-09-05 DIAGNOSIS — B182 Chronic viral hepatitis C: Secondary | ICD-10-CM | POA: Diagnosis present

## 2015-09-05 DIAGNOSIS — R600 Localized edema: Secondary | ICD-10-CM | POA: Diagnosis not present

## 2015-09-05 DIAGNOSIS — B181 Chronic viral hepatitis B without delta-agent: Secondary | ICD-10-CM | POA: Diagnosis present

## 2015-09-05 DIAGNOSIS — M25551 Pain in right hip: Secondary | ICD-10-CM | POA: Diagnosis not present

## 2015-09-05 DIAGNOSIS — K589 Irritable bowel syndrome without diarrhea: Secondary | ICD-10-CM | POA: Diagnosis present

## 2015-09-05 DIAGNOSIS — M1990 Unspecified osteoarthritis, unspecified site: Secondary | ICD-10-CM | POA: Diagnosis present

## 2015-09-05 DIAGNOSIS — D6959 Other secondary thrombocytopenia: Secondary | ICD-10-CM | POA: Diagnosis not present

## 2015-09-05 DIAGNOSIS — Z7982 Long term (current) use of aspirin: Secondary | ICD-10-CM

## 2015-09-05 DIAGNOSIS — Z9484 Stem cells transplant status: Secondary | ICD-10-CM

## 2015-09-05 DIAGNOSIS — M84573G Pathological fracture in neoplastic disease, unspecified ankle, subsequent encounter for fracture with delayed healing: Secondary | ICD-10-CM

## 2015-09-05 DIAGNOSIS — Z9221 Personal history of antineoplastic chemotherapy: Secondary | ICD-10-CM | POA: Diagnosis not present

## 2015-09-05 DIAGNOSIS — F17200 Nicotine dependence, unspecified, uncomplicated: Secondary | ICD-10-CM | POA: Diagnosis present

## 2015-09-05 DIAGNOSIS — M8580 Other specified disorders of bone density and structure, unspecified site: Secondary | ICD-10-CM | POA: Diagnosis present

## 2015-09-05 DIAGNOSIS — C9000 Multiple myeloma not having achieved remission: Secondary | ICD-10-CM | POA: Diagnosis not present

## 2015-09-05 DIAGNOSIS — I10 Essential (primary) hypertension: Secondary | ICD-10-CM | POA: Diagnosis not present

## 2015-09-05 DIAGNOSIS — Z79899 Other long term (current) drug therapy: Secondary | ICD-10-CM | POA: Diagnosis not present

## 2015-09-05 DIAGNOSIS — M79651 Pain in right thigh: Secondary | ICD-10-CM | POA: Diagnosis not present

## 2015-09-05 DIAGNOSIS — M84551K Pathological fracture in neoplastic disease, right femur, subsequent encounter for fracture with nonunion: Principal | ICD-10-CM | POA: Diagnosis present

## 2015-09-05 DIAGNOSIS — D696 Thrombocytopenia, unspecified: Secondary | ICD-10-CM | POA: Diagnosis not present

## 2015-09-05 DIAGNOSIS — F17210 Nicotine dependence, cigarettes, uncomplicated: Secondary | ICD-10-CM | POA: Diagnosis present

## 2015-09-05 DIAGNOSIS — G4730 Sleep apnea, unspecified: Secondary | ICD-10-CM | POA: Diagnosis present

## 2015-09-05 DIAGNOSIS — T50905A Adverse effect of unspecified drugs, medicaments and biological substances, initial encounter: Secondary | ICD-10-CM | POA: Diagnosis present

## 2015-09-05 DIAGNOSIS — E039 Hypothyroidism, unspecified: Secondary | ICD-10-CM | POA: Diagnosis present

## 2015-09-05 DIAGNOSIS — R6889 Other general symptoms and signs: Secondary | ICD-10-CM | POA: Diagnosis not present

## 2015-09-05 DIAGNOSIS — M899 Disorder of bone, unspecified: Secondary | ICD-10-CM | POA: Diagnosis present

## 2015-09-05 DIAGNOSIS — M898X5 Other specified disorders of bone, thigh: Secondary | ICD-10-CM | POA: Diagnosis present

## 2015-09-05 DIAGNOSIS — F329 Major depressive disorder, single episode, unspecified: Secondary | ICD-10-CM | POA: Diagnosis present

## 2015-09-05 DIAGNOSIS — K219 Gastro-esophageal reflux disease without esophagitis: Secondary | ICD-10-CM | POA: Diagnosis present

## 2015-09-05 DIAGNOSIS — M84454A Pathological fracture, pelvis, initial encounter for fracture: Secondary | ICD-10-CM | POA: Diagnosis present

## 2015-09-05 DIAGNOSIS — D492 Neoplasm of unspecified behavior of bone, soft tissue, and skin: Secondary | ICD-10-CM | POA: Diagnosis present

## 2015-09-05 DIAGNOSIS — R635 Abnormal weight gain: Secondary | ICD-10-CM | POA: Diagnosis present

## 2015-09-05 DIAGNOSIS — C9001 Multiple myeloma in remission: Secondary | ICD-10-CM | POA: Diagnosis present

## 2015-09-05 DIAGNOSIS — S72001A Fracture of unspecified part of neck of right femur, initial encounter for closed fracture: Secondary | ICD-10-CM | POA: Diagnosis not present

## 2015-09-05 DIAGNOSIS — M84451P Pathological fracture, right femur, subsequent encounter for fracture with malunion: Secondary | ICD-10-CM | POA: Diagnosis present

## 2015-09-05 LAB — IRON AND TIBC
IRON: 87 ug/dL (ref 28–170)
SATURATION RATIOS: 32 % — AB (ref 10.4–31.8)
TIBC: 269 ug/dL (ref 250–450)
UIBC: 182 ug/dL

## 2015-09-05 LAB — CBC WITH DIFFERENTIAL/PLATELET
Basophils Absolute: 0.1 10*3/uL (ref 0.0–0.1)
Basophils Relative: 1 %
EOS ABS: 0.1 10*3/uL (ref 0.0–0.7)
EOS PCT: 1 %
HEMATOCRIT: 32 % — AB (ref 36.0–46.0)
Hemoglobin: 10.3 g/dL — ABNORMAL LOW (ref 12.0–15.0)
LYMPHS PCT: 23 %
Lymphs Abs: 1.5 10*3/uL (ref 0.7–4.0)
MCH: 31.5 pg (ref 26.0–34.0)
MCHC: 32.2 g/dL (ref 30.0–36.0)
MCV: 97.9 fL (ref 78.0–100.0)
MONO ABS: 0.6 10*3/uL (ref 0.1–1.0)
Monocytes Relative: 10 %
NEUTROS PCT: 65 %
Neutro Abs: 4.1 10*3/uL (ref 1.7–7.7)
PLATELETS: 143 10*3/uL — AB (ref 150–400)
RBC: 3.27 MIL/uL — AB (ref 3.87–5.11)
RDW: 18.5 % — AB (ref 11.5–15.5)
WBC: 6.4 10*3/uL (ref 4.0–10.5)

## 2015-09-05 LAB — PATHOLOGIST SMEAR REVIEW

## 2015-09-05 LAB — RETICULOCYTES
RBC.: 3.38 MIL/uL — ABNORMAL LOW (ref 3.87–5.11)
Retic Count, Absolute: 223.1 10*3/uL — ABNORMAL HIGH (ref 19.0–186.0)
Retic Ct Pct: 6.6 % — ABNORMAL HIGH (ref 0.4–3.1)

## 2015-09-05 LAB — TSH: TSH: 3.801 u[IU]/mL (ref 0.350–4.500)

## 2015-09-05 LAB — BASIC METABOLIC PANEL
ANION GAP: 7 (ref 5–15)
BUN: 16 mg/dL (ref 6–20)
CALCIUM: 8.6 mg/dL — AB (ref 8.9–10.3)
CO2: 25 mmol/L (ref 22–32)
Chloride: 107 mmol/L (ref 101–111)
Creatinine, Ser: 0.69 mg/dL (ref 0.44–1.00)
Glucose, Bld: 93 mg/dL (ref 65–99)
Potassium: 3.9 mmol/L (ref 3.5–5.1)
Sodium: 139 mmol/L (ref 135–145)

## 2015-09-05 LAB — FOLATE: FOLATE: 15 ng/mL (ref >5.9)

## 2015-09-05 LAB — VITAMIN B12: VITAMIN B 12: 242 pg/mL (ref 180–914)

## 2015-09-05 LAB — FERRITIN: Ferritin: 123 ng/mL (ref 11–307)

## 2015-09-05 MED ORDER — HYDROMORPHONE HCL 1 MG/ML IJ SOLN: 0.5000 mg | INTRAMUSCULAR | Status: DC | PRN

## 2015-09-05 MED ORDER — MORPHINE SULFATE ER 60 MG PO TBCR
75.0000 mg | EXTENDED_RELEASE_TABLET | Freq: Three times a day (TID) | ORAL | Status: DC
  Administered 2015-09-06 (×2): 75 mg via ORAL
  Filled 2015-09-05 (×4): qty 1

## 2015-09-05 MED ORDER — LEVOTHYROXINE SODIUM 50 MCG PO TABS
50.0000 ug | ORAL_TABLET | Freq: Every day | ORAL | Status: DC
  Administered 2015-09-06: 50 ug via ORAL
  Filled 2015-09-05 (×2): qty 1

## 2015-09-05 MED ORDER — CYCLOBENZAPRINE HCL 10 MG PO TABS
10.0000 mg | ORAL_TABLET | Freq: Three times a day (TID) | ORAL | Status: DC | PRN
  Administered 2015-09-05 (×2): 10 mg via ORAL
  Filled 2015-09-05 (×3): qty 1

## 2015-09-05 MED ORDER — BISACODYL 10 MG RE SUPP: 10.0000 mg | Freq: Every day | RECTAL | Status: DC | PRN

## 2015-09-05 MED ORDER — MORPHINE SULFATE 15 MG PO TABS
45.0000 mg | ORAL_TABLET | ORAL | Status: DC | PRN
  Administered 2015-09-05: 60 mg via ORAL
  Administered 2015-09-06: 45 mg via ORAL
  Administered 2015-09-06: 60 mg via ORAL
  Filled 2015-09-05: qty 4
  Filled 2015-09-05 (×2): qty 3
  Filled 2015-09-05: qty 4

## 2015-09-05 MED ORDER — IPRATROPIUM BROMIDE 0.02 % IN SOLN: 2.5000 mL | RESPIRATORY_TRACT | Status: DC | PRN

## 2015-09-05 MED ORDER — MAGNESIUM HYDROXIDE 400 MG/5ML PO SUSP: 30.0000 mL | Freq: Every day | ORAL | Status: DC | PRN

## 2015-09-05 MED ORDER — ENOXAPARIN SODIUM 40 MG/0.4ML ~~LOC~~ SOLN
40.0000 mg | SUBCUTANEOUS | Status: DC
  Administered 2015-09-06: 40 mg via SUBCUTANEOUS
  Filled 2015-09-05: qty 0.4

## 2015-09-05 MED ORDER — VITAMIN D3 25 MCG (1000 UNIT) PO TABS
5000.0000 [IU] | ORAL_TABLET | Freq: Every day | ORAL | Status: DC
  Administered 2015-09-05 – 2015-09-06 (×2): 5000 [IU] via ORAL
  Filled 2015-09-05 (×4): qty 5

## 2015-09-05 MED ORDER — SUCRALFATE 1 G PO TABS: 1.0000 g | ORAL_TABLET | Freq: Four times a day (QID) | ORAL | Status: DC | PRN

## 2015-09-05 MED ORDER — MAGIC MOUTHWASH
10.0000 mL | Freq: Four times a day (QID) | ORAL | Status: DC | PRN
  Filled 2015-09-05: qty 10

## 2015-09-05 MED ORDER — METOPROLOL TARTRATE 100 MG PO TABS
100.0000 mg | ORAL_TABLET | Freq: Two times a day (BID) | ORAL | Status: DC
  Administered 2015-09-05 – 2015-09-06 (×3): 100 mg via ORAL
  Filled 2015-09-05: qty 1
  Filled 2015-09-05: qty 4
  Filled 2015-09-05: qty 1

## 2015-09-05 MED ORDER — HYDROMORPHONE HCL 1 MG/ML IJ SOLN
1.0000 mg | Freq: Once | INTRAMUSCULAR | Status: AC
  Administered 2015-09-05: 1 mg via INTRAVENOUS
  Filled 2015-09-05: qty 1

## 2015-09-05 MED ORDER — ASPIRIN EC 81 MG PO TBEC
81.0000 mg | DELAYED_RELEASE_TABLET | Freq: Every day | ORAL | Status: DC
  Administered 2015-09-05 – 2015-09-06 (×2): 81 mg via ORAL
  Filled 2015-09-05 (×2): qty 1

## 2015-09-05 MED ORDER — MORPHINE SULFATE 15 MG PO TABS: 30.0000 mg | ORAL_TABLET | ORAL | Status: DC | PRN

## 2015-09-05 MED ORDER — SENNOSIDES-DOCUSATE SODIUM 8.6-50 MG PO TABS
1.0000 | ORAL_TABLET | Freq: Every day | ORAL | Status: DC
  Administered 2015-09-05 – 2015-09-06 (×2): 1 via ORAL
  Filled 2015-09-05 (×2): qty 1

## 2015-09-05 MED ORDER — DEXAMETHASONE 4 MG PO TABS
4.0000 mg | ORAL_TABLET | Freq: Every day | ORAL | Status: DC
  Administered 2015-09-05 – 2015-09-06 (×2): 4 mg via ORAL
  Filled 2015-09-05 (×2): qty 1

## 2015-09-05 MED ORDER — ENOXAPARIN SODIUM 40 MG/0.4ML ~~LOC~~ SOLN: 40.0000 mg | SUBCUTANEOUS | Status: DC

## 2015-09-05 MED ORDER — HYDROMORPHONE HCL 1 MG/ML IJ SOLN
1.0000 mg | INTRAMUSCULAR | Status: DC | PRN
  Administered 2015-09-05 (×4): 1 mg via INTRAVENOUS
  Filled 2015-09-05 (×4): qty 1

## 2015-09-05 MED ORDER — LORAZEPAM 0.5 MG PO TABS
0.5000 mg | ORAL_TABLET | Freq: Four times a day (QID) | ORAL | Status: DC | PRN
  Administered 2015-09-05: 0.5 mg via ORAL
  Filled 2015-09-05: qty 1

## 2015-09-05 MED ORDER — NICOTINE 21 MG/24HR TD PT24
21.0000 mg | MEDICATED_PATCH | Freq: Every day | TRANSDERMAL | Status: DC
  Administered 2015-09-05 – 2015-09-06 (×2): 21 mg via TRANSDERMAL
  Filled 2015-09-05 (×2): qty 1

## 2015-09-05 NOTE — ED Provider Notes (Signed)
CSN: [id number]     Arrival date & time 09/05/15  0701 History   First MD Initiated Contact with Patient 09/05/15 [phone]     Chief Complaint  Patient presents with  . Hip Pain     (Consider location/radiation/quality/duration/timing/severity/associated sxs/prior Treatment) HPI Comments: Patient presents via EMS with severe pain in her right hip and thigh. She states she was transferring from wheelchair to bed when she turned the wrong way and felt a pop in her right thigh. She has had excruciating pain since. Notably she was admitted to the hospital recently for a pathological fracture of her right femur and had an IM nail placed October 20 by Dr. [name]. She denies any fever. She denies any nausea or vomiting. No back pain, chest pain or abdominal pain. She has been ambulatory with a walker since her surgery. She is taking her pain medication at home without much relief.  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 Alvy Bimler, who presents with right thigh pain.   The history is provided by the patient and the EMS personnel. The history is limited by the condition of the patient.    Past Medical History  Diagnosis Date  . Depression   . Hypertension   . Hepatitis C     genotype 1  . Diverticulosis of colon   . Multiple myeloma     kappa light chain, s/p high-dose chemo/stem cell tx - [facility]  . Hx of adenomatous colonic polyps 2007  . Anxiety   . Osteoarthritis   . Sleep apnea   . Hyperlipidemia   . GERD with stricture   . External hemorrhoids   . Hepatitis B virus infection   .  IBS (irritable bowel syndrome)   . Helicobacter pylori gastritis 2001    ? if treated  . Sciatic nerve pain     with Dr. Earle Gell  . Chronic low back pain   . Unspecified vitamin D deficiency 09/22/2013  . Allergy   . Fever 11/28/2013  . Dehydration 11/28/2013  . Nausea alone 01/22/2014  . Cough 08/02/2014  . DIVERTICULOSIS, COLON 08/31/2006  . DEPRESSION 08/31/2006    Qualifier: Diagnosis of  By: [name], [name]    . Muscle cramp 11/21/2014  . Chronic fatigue 03/07/2015  . Vitamin D deficiency 05/28/2015  . S/P radiation therapy 06/05/15-06/18/15    C7-T1    Past Surgical History  Procedure Laterality Date  . Abdominal hysterectomy    . Oophorectomy    . Dilation and curettage of uterus    . Bone marrow transplant      2009, 2013 [facility]  . Appendectomy    . Colonoscopy w/ polypectomy  08/2006    1-2 adenomas, 6 polyps total, diverticulosis and external hemorrhoids  . Upper gastrointestinal endoscopy  08/2003    esophageal stricture dilation, hiatal hernia, gastrritis  . Cholecystectomy    . Tonsillectomy and adenoidectomy    . Femur im nail Right 08/22/2015    Procedure: INTRAMEDULLARY (IM) NAIL FEMORAL;  Surgeon: [name], MD;  Location: [facility];  Service: Orthopedics;  Laterality: Right;   Family History  Problem Relation Age of Onset  .  Alcohol abuse    . Lung cancer Father     smoked  . Leukemia Mother   . Cirrhosis Sister   . Colon cancer Neg Hx   . Lung cancer Sister     smoked   Social History  Substance Use Topics  . Smoking status: Current Every Day Smoker -- 0.25 packs/day for 44 years    Types: Cigarettes  . Smokeless tobacco: Never Used  . Alcohol Use: No   OB History    No data available     Review of Systems  Constitutional: Negative for fever and activity change.  Respiratory: Negative for cough, chest tightness and shortness of breath.   Cardiovascular: Positive for leg swelling. Negative for chest pain.  Gastrointestinal: Negative for  abdominal pain.  Genitourinary: Negative for dysuria, hematuria, vaginal bleeding and vaginal discharge.  Musculoskeletal: Positive for myalgias, arthralgias and gait problem. Negative for back pain.  Skin: Negative for rash.  Neurological: Negative for dizziness, weakness and headaches.  A complete 10 system review of systems was obtained and all systems are negative except as noted in the HPI and PMH.      Allergies  Codeine; Ibuprofen; and Albuterol  Home Medications   Prior to Admission medications   Medication Sig Start Date End Date Taking? Authorizing Provider  aspirin 81 MG tablet Take 81 mg by mouth daily.   Yes Historical Provider, MD  bisacodyl (DULCOLAX) 10 MG suppository Place 10 mg rectally. If not relieved MOM give suppository rectally one dose in 24 hours as needed for constipation   Yes Historical Provider, MD  Cholecalciferol (VITAMIN D3) 5000 UNITS CAPS Take 5,000 Units by mouth daily.    Yes Historical Provider, MD  cyclobenzaprine (FLEXERIL) 10 MG tablet Take 1 tablet (10 mg total) by mouth 3 (three) times daily as needed for muscle spasms. 08/26/15  Yes Jessica U Vann, DO  dexamethasone (DECADRON) 4 MG tablet TAKE 1 TABLET (4 MG TOTAL) BY MOUTH DAILY. 08/14/15  Yes Heath Lark, MD  enoxaparin (LOVENOX) 40 MG/0.4ML injection Inject 0.4 mLs (40 mg total) into the skin daily. 08/22/15  Yes Brittney Kelly, PA-C  ipratropium (ATROVENT HFA) 17 MCG/ACT inhaler Inhale 2 puffs into the lungs every 4 (four) hours as needed for wheezing. 09/13/14  Yes Heath Lark, MD  lenalidomide (REVLIMID) 25 MG capsule Take 25 mg by mouth. One capsule for 21 days then stop for 7 days   Yes Historical Provider, MD  lenalidomide (REVLIMID) 5 MG capsule Take 5 mg by mouth. Take 5 capsules = 25 mg once daily for 3 weeks then 7 days off stop 09/10/15   Yes Historical Provider, MD  levothyroxine (SYNTHROID) 50 MCG tablet Take 1 tablet (50 mcg total) by mouth daily before breakfast. 04/17/15  Yes Heath Lark, MD  lidocaine-prilocaine (EMLA) cream Apply 1 application topically as needed. Patient taking differently: Apply 1 application topically daily as needed (for port).  05/28/15  Yes Heath Lark, MD  magic mouthwash SOLN Take 10 mLs by mouth 4 (four) times daily as needed for mouth pain. Swish and spit  Or  Swish and swallow. 07/10/15  Yes Heath Lark, MD  magnesium hydroxide (MILK OF MAGNESIA) 400 MG/5ML suspension Take 30 mLs by mouth daily as needed for mild constipation (constipation).    Yes Historical Provider, MD  metoprolol (LOPRESSOR) 100 MG tablet Take 1 tablet (100 mg total) by mouth 2 (two) times daily. 08/26/15  Yes Geradine Girt, DO  morphine (MS CONTIN) 15 MG 12  hr tablet Take 5 tablets (75 mg total) by mouth every 8 (eight) hours. 08/26/15  Yes Joseph Art, DO  morphine (MSIR) 30 MG tablet Take 1-1.5 tablets (30-45 mg total) by mouth every 2 (two) hours as needed for moderate pain or severe pain. Patient taking differently: Take 30-45 mg by mouth every 2 (two) hours as needed for moderate pain or severe pain. Take one tablet every 2 hours as needed for moderate pain 08/26/15  Yes Jessica U Vann, DO  senna-docusate (SENOKOT-S) 8.6-50 MG tablet Take 1 tablet by mouth daily. 08/26/15  Yes Joseph Art, DO  Sodium Phosphates (RA SALINE ENEMA RE) Place rectally. If not relieved by suppository give enema rectally one dose in 24 hours as needed for constipation   Yes Historical Provider, MD  sucralfate (CARAFATE) 1 G tablet Take 1 tablet (1 g total) by mouth 4 (four) times daily. Patient taking differently: Take 1 g by mouth 4 (four) times daily as needed (stomach ulcers/ pain).  06/17/15  Yes Dorothy Puffer, MD   BP 145/80 mmHg  Pulse 65  Temp(Src) 97.9 F (36.6 C) (Oral)  Resp 16  SpO2 94% Physical Exam  Constitutional: She is oriented to person, place, and time. She appears well-developed and well-nourished. She appears distressed.  Moderate distress from pain, uncomfortable   HENT:  Head: Normocephalic and atraumatic.  Mouth/Throat: Oropharynx is clear and moist. No oropharyngeal exudate.  Eyes: Conjunctivae and EOM are normal. Pupils are equal, round, and reactive to light.  Neck: Normal range of motion. Neck supple.  No meningismus.  Cardiovascular: Normal rate, regular rhythm, normal heart sounds and intact distal pulses.   No murmur heard. Pulmonary/Chest: Effort normal and breath sounds normal. No respiratory distress.  Abdominal: Soft. There is no tenderness. There is no rebound and no guarding.  Musculoskeletal: She exhibits edema and tenderness.  Tenderness and edema to R thigh. Patient with pain with any attempted range of motion of right hip. She denies any pain below the knee. There is no obvious deformity. Intact DP and PT pulses. Soft and Nontender. Calf.  Surgical incision right lateral hip is healing well  Neurological: She is alert and oriented to person, place, and time. No cranial nerve deficit. She exhibits normal muscle tone. Coordination normal.  Equal strength in lower extremities.  Skin: Skin is warm.  Psychiatric: She has a normal mood and affect. Her behavior is normal.  Nursing note and vitals reviewed.   ED Course  Procedures (including critical care time) Labs Review Labs Reviewed  CBC WITH DIFFERENTIAL/PLATELET - Abnormal; Notable for the following:    RBC 3.27 (*)    Hemoglobin 10.3 (*)    HCT 32.0 (*)    RDW 18.5 (*)    Platelets 143 (*)    All other components within normal limits  BASIC METABOLIC PANEL - Abnormal; Notable for the following:    Calcium 8.6 (*)    All other components within normal limits  IRON AND TIBC - Abnormal; Notable for the following:    Saturation Ratios 32 (*)    All other components within normal limits  RETICULOCYTES - Abnormal; Notable for the following:    Retic Ct Pct 6.6 (*)    RBC. 3.38 (*)    Retic Count, Manual 223.1 (*)    All other components within normal limits  TSH  VITAMIN  B12  FOLATE  FERRITIN  PATHOLOGIST SMEAR REVIEW    Imaging Review Ct Hip Right Wo Contrast  09/05/2015  CLINICAL DATA:  Right hip pain. EXAM: CT OF THE RIGHT HIP WITHOUT CONTRAST TECHNIQUE: Multidetector CT imaging of the right hip was performed according to the standard protocol. Multiplanar CT image reconstructions were also generated. COMPARISON:  X-ray right hip 12/19/2014 FINDINGS: There is a mildly comminuted pathologic fracture of the proximal right femoral diaphysis transfixed with an intra medullary nail and cannulated femoral neck screw. There is mild surrounding callus formation. There are multiple lytic lesions involving the right ilium, right acetabulum, right femoral head and right ischium. There is a subtle lucency through the cortex of the right ischium which may reflect a nondisplaced fracture a (image 322/ series 3). There is a small hematoma in the subcutaneous fat measuring 2.5 x 4 cm likely postsurgical. There is no other right hip fracture or dislocation. IMPRESSION: 1. Mildly comminuted pathologic fracture of the proximal right femoral diaphysis transfixed with an intra medullary nail and cannulated femoral neck screw. There is mild surrounding callus formation. 2. Multiple lytic lesions involving the right ilium, right acetabulum, right femoral head and right ischium consistent with multiple myeloma. Possible subtle nondisplaced pathologic fracture of the the right ischium (image 322/ series 3). Electronically Signed   By: Kathreen Devoid   On: 09/05/2015 14:09   Dg Hip Unilat With Pelvis 2-3 Views Right  09/05/2015  CLINICAL DATA:  Right hip pain. Multiple myeloma. Recent right hip pinning 08/22/2015 EXAM: DG HIP (WITH OR WITHOUT PELVIS) 2-3V RIGHT COMPARISON:  08/22/2015, 08/14/2015 FINDINGS: Compression screw and locking intra medullary rod in the right femur. Hardware in satisfactory position. Lytic lesion in the subtrochanteric femur with pathologic transverse fracture. Slight  increase in fracture displacement compared with 08/22/2015. Lytic lesion right ischium  is unchanged from the prior study. Cement vertebral augmentation  L5 and the right sacrum unchanged. IMPRESSION: Lytic lesion proximal right femur with pathologic fracture. Slight increase in displacement of the fracture compared with 08/22/2015. Compression screw and rod remain in satisfactory position. Lytic lesion right ischium unchanged. Electronically Signed   By: Franchot Gallo M.D.   On: 09/05/2015 08:31   Dg Femur, Min 2 Views Right  09/05/2015  CLINICAL DATA:  Right hip pain. Multiple myeloma. Right hip pinning 08/22/2015 EXAM: RIGHT FEMUR 2 VIEWS COMPARISON:  08/21/2015 and 08/22/2015 FINDINGS: Compression screw and locking intra medullary rod right femur. Rod extends into the distal femur. There are 2 screws in the distal femur. Lytic lesion subtrochanteric femur with pathologic fracture. Fracture displacement has progressed slightly since 08/22/2015. No other fracture. No other lytic lesion identified in the femur Lytic lesion right ischium  unchanged compatible with myeloma. Right hip joint normal. IMPRESSION: Right femur screw and rod in good position Lytic lesion proximal femur with pathologic fracture. Fracture displacement slightly increased from 08/22/2015. Electronically Signed   By: Franchot Gallo M.D.   On: 09/05/2015 08:33   I have personally reviewed and evaluated these images and lab results as part of my medical decision-making.   EKG Interpretation None      MDM   Final diagnoses:  Intractable pain   Patient with pathological fracture right thigh with recent I am nail placed. Presents with worsening pain today and feeling a pop after changing position.  Neurovascularly intact.  X-ray shows IM nail in place with some slight displacement of fracture compared to the previous film. This was discussed with Dr. Jamison Oka PA [place]. She states there is nothing to do except pain  control.  Plan medical admission for pain control.  Hemoglobin slightly decreased  from previous. Suspect post operative blood loss.  D/w NP ellis.   Ezequiel Essex, MD 09/05/15 [phone]

## 2015-09-05 NOTE — ED Notes (Signed)
Secretary ordering food tray for patient.

## 2015-09-05 NOTE — ED Notes (Signed)
Patient states she turned while walking this morning and heard a "pop".  Immediately after she started feeling severe pain in right hip.  Patient states she had surgery to this location this past October.  Patient has a history of bone cancer and hypertension, she has not taken her bp meds this morning, bp is 184/99 at this time.

## 2015-09-05 NOTE — H&P (Signed)
[facility] History and Physical                                                                                    Kendra Johns, is a 64 y.o. female  MRN: [id number]   DOB - [birth year]  Admit Date - 09/05/2015  Outpatient Primary MD for the patient is [name], MD  Referring MD: [name] / ER  Consulting M.D; [name] / Orthopedics  With History of -  Past Medical History  Diagnosis Date  . Depression   . Hypertension   . Hepatitis C     genotype 1  . Diverticulosis of colon   . Multiple myeloma     kappa light chain, s/p high-dose chemo/stem cell tx - [facility]  . Hx of adenomatous colonic polyps 2007  . Anxiety   . Osteoarthritis   . Sleep apnea   . Hyperlipidemia   . GERD with stricture   . External hemorrhoids   . Hepatitis B virus infection   . IBS (irritable bowel syndrome)   . Helicobacter pylori gastritis 2001    ? if treated  . Sciatic nerve pain     with Dr. Earle Gell  . Chronic low back pain   . Unspecified vitamin D deficiency 09/22/2013  . Allergy   . Fever 11/28/2013  . Dehydration 11/28/2013  . Nausea alone 01/22/2014  . Cough 08/02/2014  . DIVERTICULOSIS, COLON 08/31/2006  . DEPRESSION 08/31/2006    Qualifier: Diagnosis of  By: [name], [name]    . Muscle cramp 11/21/2014  . Chronic fatigue 03/07/2015  . Vitamin D deficiency 05/28/2015  . S/P radiation therapy 06/05/15-06/18/15    C7-T1       Past Surgical History  Procedure Laterality Date  . Abdominal hysterectomy    . Oophorectomy    . Dilation and curettage of uterus    . Bone marrow transplant      2009, 2013 [facility]  . Appendectomy    . Colonoscopy w/ polypectomy  08/2006    1-2 adenomas, 6 polyps total, diverticulosis and external hemorrhoids  . Upper gastrointestinal endoscopy  08/2003    esophageal stricture dilation, hiatal hernia, gastrritis  . Cholecystectomy    . Tonsillectomy and adenoidectomy    . Femur im nail Right 08/22/2015    Procedure: INTRAMEDULLARY (IM) NAIL  FEMORAL;  Surgeon: [name], MD;  Location: [place];  Service: Orthopedics;  Laterality: Right;    in for   Chief Complaint  Patient presents with  . Hip Pain     HPI This is a 64 year old female patient past medical history of depression, hypertension, hepatitis C genotype 1 and Hep B, anxiety, osteoarthritis, sleep apnea, dyslipidemia, GERD, who also has a history of prior plasmacytoma, multiple myeloma (diagnosed in 2008) post bone marrow/stem cell transplant in 2009 with disease relapse in 2012. Subsequent treatment with chemotherapy then a second bone marrow transplant in 2013. Unfortunately she's had disease progression and was treated with Revlimid although patient reports she is no longer taking this and only taking Decadron. Recent MRI of the spine showed mild progression of lumbar (L1) tumor. She was recently discharged from this facility  on 10/24 after sustaining a pathological fracture to her right femur. She underwent nailing procedure. Was discharged to skilled nursing facility for rehabilitative therapies and was discharged back to home on 10/27 with home health. She is on long-acting as well as shorter acting narcotics for her recent procedure as well as her chronic lumbar pain from the known tumor. At time of discharge from skilled nursing facility patient MD documented patient with adequate pain control on that regimen. She essentially utilizes a wheelchair for mobilization and today while transferring from the wheelchair to the bed she states she "turned the wrong way and felt a pop in her right thigh". She's had excruciating pain and subsequent progressive swelling since that time. Since discharge from this facility and from the nursing home she has not had any fevers chills nausea vomiting diarrhea or urinary symptoms, no shortness of breath or chest pain. She does report significant weight gain since beginning Decadron and review of her weight from October 2015 she has put on  about 30 pounds. She is complaining of significant pain in the right hip extending towards the right lateral thigh that currently has not responded to medications given in the ER.  In the ER patient was afebrile, somewhat hypertensive in the setting of pain with blood pressures 160/99 and 179/100. Pulse is regular 71 and respirations are 16 was room air saturations 97%. X-ray right hip shows lytic lesion to the proximal right femur with pathological fracture with slight increase in displacement of the fracture compared to 10/20. The compression screw and rod remained in satisfactory position. EDP contacted orthopedic services/Dr. Murphy's physician assistant who stated there was nothing further to do from a surgical standpoint and recommended medicine admission for pain management. Patient's lab work is unremarkable and at baseline for her except hemoglobin down to 10.3 from a previous baseline of around 12-13. Platelets are stable at 143,000. Since arrival to the ER the patient has been given a total of 3 mg of IV Dilaudid, 4 mg of Decadron, 100 mg of Lopressor in half a milligram of Ativan. A nicotine patch has been applied as well   Review of Systems   In addition to the HPI above,  No Fever-chills, myalgias or other constitutional symptoms No Headache, changes with Vision or hearing, new weakness, tingling, numbness in any extremity, No problems swallowing food or Liquids, indigestion/reflux No Chest pain, Cough or Shortness of Breath, palpitations, orthopnea or DOE No Abdominal pain, N/V; no melena or hematochezia, no dark tarry stools, Bowel movements are regular, No dysuria, hematuria or flank pain No new skin rashes, lesions, masses or bruises, No recent weight gain or loss No polyuria, polydypsia or polyphagia,  *A full 10 point Review of Systems was done, except as stated above, all other Review of Systems were negative.  Social History Social History  Substance Use Topics  . Smoking  status: Current Every Day Smoker -- 0.25 packs/day for 44 years    Types: Cigarettes  . Smokeless tobacco: Never Used  . Alcohol Use: No    Resides at: Private residence  Lives with: Spouse  Ambulatory status: Wheelchair   Family History Family History  Problem Relation Age of Onset  . Alcohol abuse    . Lung cancer Father     smoked  . Leukemia Mother   . Cirrhosis Sister   . Colon cancer Neg Hx   . Lung cancer Sister     smoked     Prior to Admission medications  Medication Sig Start Date End Date Taking? Authorizing Provider  aspirin 81 MG tablet Take 81 mg by mouth daily.   Yes Historical Provider, MD  bisacodyl (DULCOLAX) 10 MG suppository Place 10 mg rectally. If not relieved MOM give suppository rectally one dose in 24 hours as needed for constipation   Yes Historical Provider, MD  Cholecalciferol (VITAMIN D3) 5000 UNITS CAPS Take 5,000 Units by mouth daily.    Yes Historical Provider, MD  cyclobenzaprine (FLEXERIL) 10 MG tablet Take 1 tablet (10 mg total) by mouth 3 (three) times daily as needed for muscle spasms. 08/26/15  Yes Jessica U Vann, DO  dexamethasone (DECADRON) 4 MG tablet TAKE 1 TABLET (4 MG TOTAL) BY MOUTH DAILY. 08/14/15  Yes Heath Lark, MD  enoxaparin (LOVENOX) 40 MG/0.4ML injection Inject 0.4 mLs (40 mg total) into the skin daily. 08/22/15  Yes Brittney Kelly, PA-C  ipratropium (ATROVENT HFA) 17 MCG/ACT inhaler Inhale 2 puffs into the lungs every 4 (four) hours as needed for wheezing. 09/13/14  Yes Heath Lark, MD  lenalidomide (REVLIMID) 25 MG capsule Take 25 mg by mouth. One capsule for 21 days then stop for 7 days   Yes Historical Provider, MD  lenalidomide (REVLIMID) 5 MG capsule Take 5 mg by mouth. Take 5 capsules = 25 mg once daily for 3 weeks then 7 days off stop 09/10/15   Yes Historical Provider, MD  levothyroxine (SYNTHROID) 50 MCG tablet Take 1 tablet (50 mcg total) by mouth daily before breakfast. 04/17/15  Yes Heath Lark, MD    lidocaine-prilocaine (EMLA) cream Apply 1 application topically as needed. Patient taking differently: Apply 1 application topically daily as needed (for port).  05/28/15  Yes Heath Lark, MD  magic mouthwash SOLN Take 10 mLs by mouth 4 (four) times daily as needed for mouth pain. Swish and spit  Or  Swish and swallow. 07/10/15  Yes Heath Lark, MD  magnesium hydroxide (MILK OF MAGNESIA) 400 MG/5ML suspension Take 30 mLs by mouth daily as needed for mild constipation (constipation).    Yes Historical Provider, MD  metoprolol (LOPRESSOR) 100 MG tablet Take 1 tablet (100 mg total) by mouth 2 (two) times daily. 08/26/15  Yes Geradine Girt, DO  morphine (MS CONTIN) 15 MG 12 hr tablet Take 5 tablets (75 mg total) by mouth every 8 (eight) hours. 08/26/15  Yes Geradine Girt, DO  morphine (MSIR) 30 MG tablet Take 1-1.5 tablets (30-45 mg total) by mouth every 2 (two) hours as needed for moderate pain or severe pain. Patient taking differently: Take 30-45 mg by mouth every 2 (two) hours as needed for moderate pain or severe pain. Take one tablet every 2 hours as needed for moderate pain 08/26/15  Yes Jessica U Vann, DO  senna-docusate (SENOKOT-S) 8.6-50 MG tablet Take 1 tablet by mouth daily. 08/26/15  Yes Geradine Girt, DO  Sodium Phosphates (RA SALINE ENEMA RE) Place rectally. If not relieved by suppository give enema rectally one dose in 24 hours as needed for constipation   Yes Historical Provider, MD  sucralfate (CARAFATE) 1 G tablet Take 1 tablet (1 g total) by mouth 4 (four) times daily. Patient taking differently: Take 1 g by mouth 4 (four) times daily as needed (stomach ulcers/ pain).  06/17/15  Yes Kyung Rudd, MD    Allergies  Allergen Reactions  . Codeine Itching  . Ibuprofen Other (See Comments)    REACTION: gi trouble  . Albuterol Hives    Pt tolerated albuterol neb without issue  12/2014    Physical Exam  Vitals  Blood pressure 171/100, pulse 68, temperature 97.9 F (36.6 C), temperature  source Oral, resp. rate 12, SpO2 98 %.   General:  In no acute distress, appears in significant pain patient noted to be holding her right knee and attempting to elevate the leg off of the bed stating this improves her pain  Psych:  Normal affect, Denies Suicidal or Homicidal ideations, Awake Alert, Oriented X 3. Speech and thought patterns are clear and appropriate, no apparent short term memory deficits  Neuro:   No focal neurological deficits, CN II through XII intact, Strength 5/5 all 4 extremities, Sensation intact all 4 extremities.  ENT:  Ears and Eyes appear Normal, Conjunctivae clear, PER. Moist oral mucosa without erythema or exudates.  Neck:  Supple, No lymphadenopathy appreciated  Respiratory:  Symmetrical chest wall movement, Good air movement bilaterally, CTAB. Room Air; right anterior chest wall with Port-A-Cath that has been accessed for IV fluids  Cardiac:  RRR, No Murmurs, no LE edema noted, no JVD, No carotid bruits, peripheral pulses palpable at 2+  Abdomen:  Positive bowel sounds, Soft, Non tender, Non distended,  No masses appreciated, no obvious hepatosplenomegaly  Skin:  No Cyanosis, Normal Skin Turgor, No Skin Rash or Bruise.  Extremities: Asymmetrical with swelling involving the right leg from the knee to the hip associated with significant tenderness upon palpation, distal pulses are intact and the swelling is not extremely tight at this juncture  Data Review  CBC  Recent Labs Lab 09/05/15 0850  WBC 6.4  HGB 10.3*  HCT 32.0*  PLT 143*  MCV 97.9  MCH 31.5  MCHC 32.2  RDW 18.5*  LYMPHSABS 1.5  MONOABS 0.6  EOSABS 0.1  BASOSABS 0.1    Chemistries   Recent Labs Lab 09/05/15 0850  NA 139  K 3.9  CL 107  CO2 25  GLUCOSE 93  BUN 16  CREATININE 0.69  CALCIUM 8.6*    estimated creatinine clearance is 65.6 mL/min (by C-G formula based on Cr of 0.69).  No results for input(s): TSH, T4TOTAL, T3FREE, THYROIDAB in the last 72 hours.  Invalid  input(s): FREET3  Coagulation profile No results for input(s): INR, PROTIME in the last 168 hours.  No results for input(s): DDIMER in the last 72 hours.  Cardiac Enzymes No results for input(s): CKMB, TROPONINI, MYOGLOBIN in the last 168 hours.  Invalid input(s): CK  Invalid input(s): POCBNP  Urinalysis    Component Value Date/Time   COLORURINE yellow 02/03/2010 1025   APPEARANCEUR Clear 02/03/2010 1025   LABSPEC 1.025 02/03/2010 1025   PHURINE 5.0 02/03/2010 1025   HGBUR trace-intact 02/03/2010 1025   BILIRUBINUR n 02/03/2011   BILIRUBINUR negative 02/03/2010 1025   PROTEINUR n 02/03/2011   UROBILINOGEN 0.2 02/03/2011   UROBILINOGEN 0.2 02/03/2010 1025   NITRITE n 02/03/2011   NITRITE negative 02/03/2010 1025   LEUKOCYTESUR 1+ 02/03/2011    Imaging results:   Pelvis Portable  08/22/2015  CLINICAL DATA:  Right Hip fracture requiring operative repair ([place]), post op EXAM: PORTABLE PELVIS 1-2 VIEWS COMPARISON:  08/22/2015 FINDINGS: Status post right IM nail/lag screw fixation of right sub trochanteric pathologic hip fracture. Femoral head appears located in the acetabulum. Previous vertebroplasty. IMPRESSION: Status post ORIF right hip. Electronically Signed   By: Nolon Nations M.D.   On: 08/22/2015 17:13   Dg C-arm 1-60 Min  08/22/2015  CLINICAL DATA:  Multiple myeloma, with a pathologic fracture. RIGHT leg pain. EXAM: RIGHT  FEMUR 2 VIEWS; DG C-ARM 61-120 MIN COMPARISON:  Plain films most recent 08/21/2015. FINDINGS: Intra medullary rod with compression screw has been placed across the proximal femoral subtrochanteric pathologic fracture. Satisfactory position and alignment. IMPRESSION: As above. Electronically Signed   By: Staci Righter M.D.   On: 08/22/2015 16:31   Dg Hip Unilat With Pelvis 2-3 Views Right  09/05/2015  CLINICAL DATA:  Right hip pain. Multiple myeloma. Recent right hip pinning 08/22/2015 EXAM: DG HIP (WITH OR WITHOUT PELVIS) 2-3V RIGHT COMPARISON:   08/22/2015, 08/14/2015 FINDINGS: Compression screw and locking intra medullary rod in the right femur. Hardware in satisfactory position. Lytic lesion in the subtrochanteric femur with pathologic transverse fracture. Slight increase in fracture displacement compared with 08/22/2015. Lytic lesion right ischium  is unchanged from the prior study. Cement vertebral augmentation  L5 and the right sacrum unchanged. IMPRESSION: Lytic lesion proximal right femur with pathologic fracture. Slight increase in displacement of the fracture compared with 08/22/2015. Compression screw and rod remain in satisfactory position. Lytic lesion right ischium unchanged. Electronically Signed   By: Franchot Gallo M.D.   On: 09/05/2015 08:31   Dg Hip Unilat With Pelvis 2-3 Views Right  08/14/2015  CLINICAL DATA:  Pain.  Multiple myeloma. EXAM: DG HIP (WITH OR WITHOUT PELVIS) 2-3V RIGHT COMPARISON:  None. FINDINGS: Degenerative changes lumbar spine and both hips. Diffuse progress osteopenia. This may be secondary to myeloma. Prominent lytic lesions proximal right femoral shaft. Scratched lytic lesion right iliac wing cannot be excluded. Methylmethacrylate again noted L5 and in the right sacral wing. Pelvic calcifications consistent phleboliths . IMPRESSION: Progressive diffuse osteopenia with lytic lesions in the right proximal femur and and possibly the right iliac wing. Multiple myeloma could present in this fashion . No evidence of fracture. The proximal right femoral shaft lytic lesion is prominent and would be prone to fracture. Orthopedic/radiation therapy consultation should be considered . Electronically Signed   By: Marcello Moores  Register   On: 08/14/2015 13:38   Dg Femur 1v Right  08/21/2015  CLINICAL DATA:  Multiple myeloma, known RIGHT femoral tumor, increased pain for 1 day EXAM: RIGHT FEMUR 1 VIEW COMPARISON:  08/14/2015 FINDINGS: Osseous demineralization. Poorly defined lytic lesions are identified at the proximal and  proximal/mid RIGHT femoral diaphysis compatible with multiple myeloma. Endosteal cortical thinning at the larger more proximal femoral lytic lesion. A small amount of periosteal reaction is identified adjacent to the more cranial of the proximal femoral lesions, located in the subtrochanteric region. Additionally, subtle linear cortical lucency is seen in the medial cortex of the proximal RIGHT femur inferior to the lesser trochanter likely representing a developing pathologic fracture. Tiny rounded lytic lesion of the distal RIGHT femoral metaphysis is noted. Knee and hip joint alignments normal. No additional fracture, dislocation or bone destruction. IMPRESSION: Lytic lesions within the RIGHT femoral diaphysis compatible with multiple myeloma. Periosteal reaction and subtle cortical linear lucency at thinned medial cortex of the proximal RIGHT femoral diaphysis inferior to the lesser trochanter likely representing a developing pathologic fracture. Electronically Signed   By: Lavonia Dana M.D.   On: 08/21/2015 16:45   Dg Femur, Min 2 Views Right  09/05/2015  CLINICAL DATA:  Right hip pain. Multiple myeloma. Right hip pinning 08/22/2015 EXAM: RIGHT FEMUR 2 VIEWS COMPARISON:  08/21/2015 and 08/22/2015 FINDINGS: Compression screw and locking intra medullary rod right femur. Rod extends into the distal femur. There are 2 screws in the distal femur. Lytic lesion subtrochanteric femur with pathologic fracture. Fracture displacement has progressed  slightly since 08/22/2015. No other fracture. No other lytic lesion identified in the femur Lytic lesion right ischium  unchanged compatible with myeloma. Right hip joint normal. IMPRESSION: Right femur screw and rod in good position Lytic lesion proximal femur with pathologic fracture. Fracture displacement slightly increased from 08/22/2015. Electronically Signed   By: Franchot Gallo M.D.   On: 09/05/2015 08:33   Dg Femur, Min 2 Views Right  08/22/2015  CLINICAL DATA:   Multiple myeloma, with a pathologic fracture. RIGHT leg pain. EXAM: RIGHT FEMUR 2 VIEWS; DG C-ARM 61-120 MIN COMPARISON:  Plain films most recent 08/21/2015. FINDINGS: Intra medullary rod with compression screw has been placed across the proximal femoral subtrochanteric pathologic fracture. Satisfactory position and alignment. IMPRESSION: As above. Electronically Signed   By: Staci Righter M.D.   On: 08/22/2015 16:31   Dg Femur, Min 2 Views Right  08/14/2015  CLINICAL DATA:  Two weeks of right knee pain without reported trauma; multiple myeloma not in remission, currently on chemotherapy. EXAM: RIGHT FEMUR 2 VIEWS COMPARISON:  Images of the right femur and right knee dated May 24, 2014 FINDINGS: There is a5 mm diameter well-circumscribed lucency in the distal third of shaft of the right femur which is stable. Lucencies previously demonstrated more inferiorly are less well defined and may have increased in size. No cortical disruption is observed. The knee joint compartments appear reasonably well maintained. There is beaking of the tibial spines and tiny spurs from the articular margins of the patella. IMPRESSION: Lucencies in the distal third of the femur which likely reflect myelomatous involvement. There are mild degenerative changes of the right knee. Electronically Signed   By: David  Martinique M.D.   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 Alvy Bimler has been added as consultant -CT right hip noncontrasted ordered at time of admission revealed multiple lytic lesions involving the right ilium, right acetabulum, right femoral head and right ischium which is consistent with her known multiple myeloma; possible subtle nondisplaced pathological fracture into the right ischium  Active Problems:   Intractable pain/  Lumbar spine 1 tumor -See above regarding chronic pain medications -Current acute pain related to pathological refracture right femur   Anemia -Hemoglobin has drifted down 2 g since last admission  -Patient was discharged on Lovenox for DVT prophylaxis (recent surgery, immobility, malignancy) and now has increasing swelling in right thigh post recurrent pathological fracture which is concerning for potential hematoma formation; CT without contrast of right thigh without hematoma    Essential hypertension -Poorly controlled in setting of acute and intractable pain -Continue preadmission Lopressor    Thrombocytopenia due to drugs -Platelets stable and at baseline    Weight gain due to medication -Weight October 2015 was 119 pounds and current weight is 146 pounds -Suspect fluid volume weight gain as well as genuine weight gain in setting of chronic use of  Decadron -Patient does not have dependent edema or any respiratory symptoms suggestive of heart failure -Check anemia panel    Chronic hepatitis B & C (HCC) -LFTs October 2016 within normal limits    Hypothyroidism -Patient had abnormal TSH of 6.44 on 04/17/15 though we'll check TSH -Continue Synthroid    Tobacco abuse -Patient requested nicotine patch -Reports smokes half a pack per day -Ativan as above     DVT Prophylaxis: Lovenox  Family Communication:   Family member at bedside with patient's permission  Code Status:  Full code  Condition:  Stable  Discharge  disposition: May need to discharge to rehabilitation after this admission; this is dependent on orthopedic evaluation regarding when patient is appropriate to begin weightbearing and thus undergo PT/OT evaluation-was dependent on wheelchair prior to admission  Time spent in minutes : 60      Yuriko Portales L. ANP on 09/05/2015 at 11:32 AM  Between 7am to 7pm - Pager - [phone]  After 7pm go to [email or web address] - password [email or web address]  And look for the night coverage person covering me after hours  [facility]

## 2015-09-06 ENCOUNTER — Ambulatory Visit: Admission: RE | Admit: 2015-09-06 | Payer: Medicare Other | Source: Ambulatory Visit | Admitting: Radiation Oncology

## 2015-09-06 ENCOUNTER — Ambulatory Visit: Payer: Medicare Other

## 2015-09-06 ENCOUNTER — Ambulatory Visit: Payer: Medicare Other | Admitting: Radiation Oncology

## 2015-09-06 DIAGNOSIS — D696 Thrombocytopenia, unspecified: Secondary | ICD-10-CM

## 2015-09-06 DIAGNOSIS — M545 Low back pain: Secondary | ICD-10-CM

## 2015-09-06 DIAGNOSIS — C9000 Multiple myeloma not having achieved remission: Secondary | ICD-10-CM

## 2015-09-06 DIAGNOSIS — C9002 Multiple myeloma in relapse: Secondary | ICD-10-CM | POA: Diagnosis not present

## 2015-09-06 DIAGNOSIS — D6959 Other secondary thrombocytopenia: Secondary | ICD-10-CM

## 2015-09-06 DIAGNOSIS — S32601A Unspecified fracture of right ischium, initial encounter for closed fracture: Secondary | ICD-10-CM

## 2015-09-06 DIAGNOSIS — I10 Essential (primary) hypertension: Secondary | ICD-10-CM

## 2015-09-06 DIAGNOSIS — D6189 Other specified aplastic anemias and other bone marrow failure syndromes: Secondary | ICD-10-CM

## 2015-09-06 DIAGNOSIS — D6481 Anemia due to antineoplastic chemotherapy: Secondary | ICD-10-CM

## 2015-09-06 DIAGNOSIS — M84551K Pathological fracture in neoplastic disease, right femur, subsequent encounter for fracture with nonunion: Principal | ICD-10-CM

## 2015-09-06 DIAGNOSIS — Z720 Tobacco use: Secondary | ICD-10-CM

## 2015-09-06 MED ORDER — HEPARIN SOD (PORK) LOCK FLUSH 100 UNIT/ML IV SOLN
500.0000 [IU] | INTRAVENOUS | Status: AC | PRN
  Administered 2015-09-06: 500 [IU]

## 2015-09-06 NOTE — Progress Notes (Signed)
Orthopedic Tech Progress Note Patient Details:  Kendra Johns Mar 16, 1951 [id number]  Ortho Devices Type of Ortho Device: Knee Immobilizer Ortho Device/Splint Location: rle Ortho Device/Splint Interventions: Application   Renette Hsu 09/06/2015, 10:06 AM

## 2015-09-06 NOTE — Progress Notes (Signed)
Kendra Johns discharged home per MD order. Discharge instructions reviewed and discussed with patient. All questions and concerns answered. Copy of instructions and scripts given to patient. Port deaccessed.  Patient escorted to car by staff in a wheelchair. No distress noted upon discharge.   Tarri Abernethy R 09/06/2015 3:41 PM

## 2015-09-06 NOTE — Consult Note (Signed)
ORTHOPAEDIC CONSULTATION  REQUESTING PHYSICIAN: Janece Canterbury, MD  Chief Complaint: R hip pain  HPI: Kendra Johns is a 64 y.o. female who complains of R hip and leg pain after feeling a "popping" sensation when getting out of the wheel chair.  The patient was unable to bear weight after that.  The patient was just treated with an IM nail of the R hip for a pathologic fracture on 10/20.  In the ED she was found to have further displaced the fracture as well as broken the distal interlock screw.  She denies any numbness or tingling down the leg.  Patient mobilizes from bed to wheel chair at baseline. She is on chronic narcotics for pain control.   Past Medical History  Diagnosis Date  . Depression   . Hypertension   . Hepatitis C     genotype 1  . Diverticulosis of colon   . Multiple myeloma     kappa light chain, s/p high-dose chemo/stem cell tx - [facility]  . Hx of adenomatous colonic polyps 2007  . Anxiety   . Osteoarthritis   . Sleep apnea   . Hyperlipidemia   . GERD with stricture   . External hemorrhoids   . Hepatitis B virus infection   . IBS (irritable bowel syndrome)   . Helicobacter pylori gastritis 2001    ? if treated  . Sciatic nerve pain     with Dr. Earle Gell  . Chronic low back pain   . Unspecified vitamin D deficiency 09/22/2013  . Allergy   . Fever 11/28/2013  . Dehydration 11/28/2013  . Nausea alone 01/22/2014  . Cough 08/02/2014  . DIVERTICULOSIS, COLON 08/31/2006  . DEPRESSION 08/31/2006    Qualifier: Diagnosis of  By: [name], [name]    . Muscle cramp 11/21/2014  . Chronic fatigue 03/07/2015  . Vitamin D deficiency 05/28/2015  . S/P radiation therapy 06/05/15-06/18/15    C7-T1    Past Surgical History  Procedure Laterality Date  . Abdominal hysterectomy    . Oophorectomy    . Dilation and curettage of uterus    . Bone marrow transplant      2009, 2013 [facility]  . Appendectomy    . Colonoscopy w/ polypectomy  08/2006    1-2 adenomas, 6 polyps  total, diverticulosis and external hemorrhoids  . Upper gastrointestinal endoscopy  08/2003    esophageal stricture dilation, hiatal hernia, gastrritis  . Cholecystectomy    . Tonsillectomy and adenoidectomy    . Femur im nail Right 08/22/2015    Procedure: INTRAMEDULLARY (IM) NAIL FEMORAL;  Surgeon: [name], MD;  Location: [facility];  Service: Orthopedics;  Laterality: Right;   Social History   Social History  . Marital Status: Married    Spouse Name: Shirlee Whitmire.  . Number of Children: 2  . Years of Education: N/A   Occupational History  . Security    Social History Main Topics  . Smoking status: Current Every Day Smoker -- 0.25 packs/day for 44 years    Types: Cigarettes  . Smokeless tobacco: Never Used  . Alcohol Use: No  . Drug Use: No  . Sexual Activity: No   Other Topics Concern  . None   Social History Narrative   Lives with husband and grandson.  Ambulates with a walker at baseline.   Family History  Problem Relation Age of Onset  . Alcohol abuse    . Lung cancer Father  smoked  . Leukemia Mother   . Cirrhosis Sister   . Colon cancer Neg Hx   . Lung cancer Sister     smoked   Allergies  Allergen Reactions  . Codeine Itching  . Ibuprofen Other (See Comments)    REACTION: gi trouble  . Albuterol Hives    Pt tolerated albuterol neb without issue 12/2014   Prior to Admission medications   Medication Sig Start Date End Date Taking? Authorizing Provider  aspirin 81 MG tablet Take 81 mg by mouth daily.   Yes Historical Provider, MD  bisacodyl (DULCOLAX) 10 MG suppository Place 10 mg rectally. If not relieved MOM give suppository rectally one dose in 24 hours as needed for constipation   Yes Historical Provider, MD  Cholecalciferol (VITAMIN D3) 5000 UNITS CAPS Take 5,000 Units by mouth daily.    Yes Historical Provider, MD  cyclobenzaprine (FLEXERIL) 10 MG tablet Take 1 tablet (10 mg total) by mouth 3 (three) times daily as needed for muscle spasms.  08/26/15  Yes Jessica U Vann, DO  dexamethasone (DECADRON) 4 MG tablet TAKE 1 TABLET (4 MG TOTAL) BY MOUTH DAILY. 08/14/15  Yes Heath Lark, MD  enoxaparin (LOVENOX) 40 MG/0.4ML injection Inject 0.4 mLs (40 mg total) into the skin daily. 08/22/15  Yes Javier Gell, PA-C  ipratropium (ATROVENT HFA) 17 MCG/ACT inhaler Inhale 2 puffs into the lungs every 4 (four) hours as needed for wheezing. 09/13/14  Yes Heath Lark, MD  lenalidomide (REVLIMID) 25 MG capsule Take 25 mg by mouth. One capsule for 21 days then stop for 7 days   Yes Historical Provider, MD  lenalidomide (REVLIMID) 5 MG capsule Take 5 mg by mouth. Take 5 capsules = 25 mg once daily for 3 weeks then 7 days off stop 09/10/15   Yes Historical Provider, MD  levothyroxine (SYNTHROID) 50 MCG tablet Take 1 tablet (50 mcg total) by mouth daily before breakfast. 04/17/15  Yes Heath Lark, MD  lidocaine-prilocaine (EMLA) cream Apply 1 application topically as needed. Patient taking differently: Apply 1 application topically daily as needed (for port).  05/28/15  Yes Heath Lark, MD  magic mouthwash SOLN Take 10 mLs by mouth 4 (four) times daily as needed for mouth pain. Swish and spit  Or  Swish and swallow. 07/10/15  Yes Heath Lark, MD  magnesium hydroxide (MILK OF MAGNESIA) 400 MG/5ML suspension Take 30 mLs by mouth daily as needed for mild constipation (constipation).    Yes Historical Provider, MD  metoprolol (LOPRESSOR) 100 MG tablet Take 1 tablet (100 mg total) by mouth 2 (two) times daily. 08/26/15  Yes Geradine Girt, DO  morphine (MS CONTIN) 15 MG 12 hr tablet Take 5 tablets (75 mg total) by mouth every 8 (eight) hours. 08/26/15  Yes Geradine Girt, DO  morphine (MSIR) 30 MG tablet Take 1-1.5 tablets (30-45 mg total) by mouth every 2 (two) hours as needed for moderate pain or severe pain. Patient taking differently: Take 30-45 mg by mouth every 2 (two) hours as needed for moderate pain or severe pain. Take one tablet every 2 hours as needed for  moderate pain 08/26/15  Yes Jessica U Vann, DO  senna-docusate (SENOKOT-S) 8.6-50 MG tablet Take 1 tablet by mouth daily. 08/26/15  Yes Geradine Girt, DO  Sodium Phosphates (RA SALINE ENEMA RE) Place rectally. If not relieved by suppository give enema rectally one dose in 24 hours as needed for constipation   Yes Historical Provider, MD  sucralfate (CARAFATE) 1 G tablet  Take 1 tablet (1 g total) by mouth 4 (four) times daily. Patient taking differently: Take 1 g by mouth 4 (four) times daily as needed (stomach ulcers/ pain).  06/17/15  Yes Kyung Rudd, MD   Ct Hip Right Wo Contrast  09/05/2015  CLINICAL DATA:  Right hip pain. EXAM: CT OF THE RIGHT HIP WITHOUT CONTRAST TECHNIQUE: Multidetector CT imaging of the right hip was performed according to the standard protocol. Multiplanar CT image reconstructions were also generated. COMPARISON:  X-ray right hip 12/19/2014 FINDINGS: There is a mildly comminuted pathologic fracture of the proximal right femoral diaphysis transfixed with an intra medullary nail and cannulated femoral neck screw. There is mild surrounding callus formation. There are multiple lytic lesions involving the right ilium, right acetabulum, right femoral head and right ischium. There is a subtle lucency through the cortex of the right ischium which may reflect a nondisplaced fracture a (image 322/ series 3). There is a small hematoma in the subcutaneous fat measuring 2.5 x 4 cm likely postsurgical. There is no other right hip fracture or dislocation. IMPRESSION: 1. Mildly comminuted pathologic fracture of the proximal right femoral diaphysis transfixed with an intra medullary nail and cannulated femoral neck screw. There is mild surrounding callus formation. 2. Multiple lytic lesions involving the right ilium, right acetabulum, right femoral head and right ischium consistent with multiple myeloma. Possible subtle nondisplaced pathologic fracture of the the right ischium (image 322/ series 3).  Electronically Signed   By: Kathreen Devoid   On: 09/05/2015 14:09   Dg Hip Unilat With Pelvis 2-3 Views Right  09/05/2015  CLINICAL DATA:  Right hip pain. Multiple myeloma. Recent right hip pinning 08/22/2015 EXAM: DG HIP (WITH OR WITHOUT PELVIS) 2-3V RIGHT COMPARISON:  08/22/2015, 08/14/2015 FINDINGS: Compression screw and locking intra medullary rod in the right femur. Hardware in satisfactory position. Lytic lesion in the subtrochanteric femur with pathologic transverse fracture. Slight increase in fracture displacement compared with 08/22/2015. Lytic lesion right ischium  is unchanged from the prior study. Cement vertebral augmentation  L5 and the right sacrum unchanged. IMPRESSION: Lytic lesion proximal right femur with pathologic fracture. Slight increase in displacement of the fracture compared with 08/22/2015. Compression screw and rod remain in satisfactory position. Lytic lesion right ischium unchanged. Electronically Signed   By: Franchot Gallo M.D.   On: 09/05/2015 08:31   Dg Femur, Min 2 Views Right  09/05/2015  CLINICAL DATA:  Right hip pain. Multiple myeloma. Right hip pinning 08/22/2015 EXAM: RIGHT FEMUR 2 VIEWS COMPARISON:  08/21/2015 and 08/22/2015 FINDINGS: Compression screw and locking intra medullary rod right femur. Rod extends into the distal femur. There are 2 screws in the distal femur. Lytic lesion subtrochanteric femur with pathologic fracture. Fracture displacement has progressed slightly since 08/22/2015. No other fracture. No other lytic lesion identified in the femur Lytic lesion right ischium  unchanged compatible with myeloma. Right hip joint normal. IMPRESSION: Right femur screw and rod in good position Lytic lesion proximal femur with pathologic fracture. Fracture displacement slightly increased from 08/22/2015. Electronically Signed   By: Franchot Gallo M.D.   On: 09/05/2015 08:33    Positive ROS: All other systems have been reviewed and were otherwise negative with the  exception of those mentioned in the HPI and as above.  Labs cbc  Recent Labs  09/05/15 0850  WBC 6.4  HGB 10.3*  HCT 32.0*  PLT 143*    Labs inflam No results for input(s): CRP in the last 72 hours.  Invalid input(s): ESR  Labs coag No results for input(s): INR, PTT in the last 72 hours.  Invalid input(s): PT   Recent Labs  09/05/15 0850  NA 139  K 3.9  CL 107  CO2 25  GLUCOSE 93  BUN 16  CREATININE 0.69  CALCIUM 8.6*    Physical Exam: Filed Vitals:   09/06/15 0450  BP: 164/92  Pulse: 58  Temp: 98.3 F (36.8 C)  Resp: 18   General: Alert, no acute distress Cardiovascular: No pedal edema Respiratory: No cyanosis, no use of accessory musculature GI: No organomegaly, abdomen is soft and non-tender Skin: No lesions in the area of chief complaint other than those listed below in MSK exam.  Neurologic: Sensation intact distally Psychiatric: Patient is competent for consent with normal mood and affect Lymphatic: No axillary or cervical lymphadenopathy  MUSCULOSKELETAL:  R leg has moderate swelling from hip to the knee.  No erythema or warmth.  Tender to palpation over the fracture site.  Pain with ROM and log roll of the R leg.  Sensation intact with 2+ distal pulses.  Other extremities are atraumatic with painless ROM and NVI.  Assessment: R pathologic femur fracture treated with IM nail on 10/22 with subsequent injury resulting in further displacement of fracture and a broken distal interlock screw  Plan: Dr. [name] had a long discussion with the patient and family as far as treatment options.  At this time, the patient is requesting non-op management.  We will continue her chronic pain meds to help with pain control.  Depending on how the patient mobilizes, she may need another short stay in rehab.  Will have the patient WBAT in the RLE in the knee immobilizer.   Gae Dry, PA-C Cell [phone]   09/06/2015 7:13 AM

## 2015-09-06 NOTE — Consult Note (Signed)
[place] [facility]  [phone]   Patient Care Team: Marin Olp, MD as PCP - General (Family Medicine) Jeanann Lewandowsky, MD as Consulting Physician (Medical Oncology) Marin Olp, MD as Consulting Physician (Family Medicine) Heath Lark, MD as Consulting Physician (Hematology and Oncology)  HOSPITAL CONSULT  NOTE I have seen the patient, examined her and edited the notes as follows  HPI: 64 year old woman with a history of Multiple Myeloma, admitted on 11/3 with severe right hip pain. She had been recently hospitalized for intramuscular nailing of right hip pathological fracture on 10/20-24. CT right hip found to have further displacement of the fracture and distal interlock screw. She denies any numbness or tingling.She denies any decreased mobility other that pain. This was treated symptomatically as per patient request Denies fevers, chills, night sweats, vision changes, or mucositis. Denies any respiratory complaints. Denies any chest pain or palpitations. Denies lower extremity swelling on the left, some swelling on the right due to displacement. Denies nausea, heartburn or change in bowel habits Denies abdominal pain. Appetite is normal. Denies any dysuria. Denies abnormal skin rashes, or neuropathy. Denies any bleeding issues such as epistaxis, hematemesis, hematuria or hematochezia.   Oncology History   Multiple myeloma, kappa light chain disease, Durie-Salmon stage III     Multiple myeloma ([place])   07/16/2006 Bone Marrow Biopsy BM biopsy is non-diagnostic   09/21/2006 Procedure L5 vertebral biopsy 5% plasma cell   10/25/2006 Bone Marrow Biopsy BM biopsy is hypercellular with 5% plasma cell   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   01/03/2007 - 01/10/2007 Radiation Therapy Approximate date only, received RT for plasmacytoma followed by surgery   09/14/2007 Initial Diagnosis MULTIPLE  MYELOMA   10/05/2007 Bone Marrow Biopsy Bm biopsy was  negative   06/18/2008 Bone Marrow Biopsy BM biopsy was negative   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 [facility]   04/13/2011 Relapse/Recurrence Disease relapse   04/14/2011 Bone Marrow Biopsy Bm biopsy showed 2 % plasma cell   04/27/2011 Relapse/Recurrence Disease relapse, treated with Velcade/Cytoxan/Dex   09/07/2011 - 07/28/2013 Chemotherapy She has been receiving Velcade   12/27/2011 Bone Marrow Transplant 2nd transplant at [facility]   07/28/2013 Relapse/Recurrence Chemo is stopped due to progression of disease   08/29/2013 Imaging PEt/CT showed recurrence of disease with new lesion on her rib with compression fracture   09/13/2013 Bone Marrow Biopsy BM biopsy is hypercellular with 6% plasma cell   10/10/2013 - 10/20/2013 Radiation Therapy Started on palliative XRT for rib pain   11/27/2013 - 04/06/2014 Chemotherapy The patient starts chemotherapy with Carfilzomib   07/19/2014 Imaging Repeat bloodwork and PET/CT scan shows significant disease progression.   08/02/2014 - 03/06/2015 Chemotherapy She enrolled in clinical trial using combination therapy with Revlimid, dexamethasone and Elotuzumab   03/07/2015 - 06/04/2015 Chemotherapy She is on maintenance Revlimid only without dexamethasone   05/27/2015 Imaging  MRI spine showed new compression fracture.   06/04/2015 - 06/18/2015 Radiation Therapy  she received palliative radiation therapy to 25 Gy C7-T1   07/10/2015 -  Chemotherapy She received Elotuzumab, dex and Revlimid   08/01/2015 Imaging MRI of the spine was performed today due to new onset of worsening right hip pain/flank area. MRI show mild progression of the L1 vertebral body.   09/05/15 Hospital  He was admitted for pathologic fracture of previously repaired right femur fracture with further displacement of injury.     MEDICATIONS: Scheduled Meds: . aspirin EC  81 mg Oral Daily  .  cholecalciferol  5,000 Units Oral Daily  . dexamethasone  4 mg Oral Daily  . enoxaparin  40 mg Subcutaneous Q24H    . levothyroxine  50 mcg Oral QAC breakfast  . metoprolol  100 mg Oral BID  . morphine  75 mg Oral 3 times per day  . nicotine  21 mg Transdermal Daily  . senna-docusate  1 tablet Oral Daily   Continuous Infusions:  PRN Meds:.bisacodyl, cyclobenzaprine, HYDROmorphone (DILAUDID) injection, ipratropium, LORazepam, magic mouthwash, magnesium hydroxide, morphine, sucralfate   ALLERGIES:   Allergies  Allergen Reactions  . Codeine Itching  . Ibuprofen Other (See Comments)    REACTION: gi trouble  . Albuterol Hives    Pt tolerated albuterol neb without issue 12/2014     PHYSICAL EXAMINATION:  Filed Vitals:   09/06/15 0450  BP: 164/92  Pulse: 58  Temp: 98.3 F (36.8 C)  Resp: 18   There were no vitals filed for this visit.  GENERAL:alert, no distress and comfortable, cushingoid appearance SKIN: skin color, texture, turgor are normal, no rashes or significant lesions EYES: normal, conjunctiva are pink and non-injected, sclera clear OROPHARYNX:no exudate, no erythema and lips, buccal mucosa, and tongue normal  NECK: supple, thyroid normal size, non-tender, without nodularity LYMPH:  no palpable lymphadenopathy in the cervical, axillary or inguinal LUNGS: clear to auscultation and percussion with normal breathing effort HEART: regular rate & rhythm and no murmurs and right leg swelling at the fracture site ABDOMEN: soft, non-tender and normal bowel sounds PSYCH: alert & oriented x 3 with fluent speech NEURO: no focal motor/sensory deficits   LABORATORY/RADIOLOGY DATA:   Recent Labs Lab 09/05/15 0850  WBC 6.4  HGB 10.3*  HCT 32.0*  PLT 143*  MCV 97.9  MCH 31.5  MCHC 32.2  RDW 18.5*  LYMPHSABS 1.5  MONOABS 0.6  EOSABS 0.1  BASOSABS 0.1    CMP    Recent Labs Lab 09/05/15 0850  NA 139  K 3.9  CL 107  CO2 25  GLUCOSE 93  BUN 16  CREATININE 0.69  CALCIUM 8.6*        Component Value Date/Time   BILITOT 0.39 08/07/2015 0932   BILITOT 0.6 09/11/2014  0336   BILIDIR 0.1 02/03/2011 0936    Anemia panel:    Recent Labs  09/05/15 1152  VITAMINB12 242  FOLATE 15.0  FERRITIN 123  TIBC 269  IRON 87  RETICCTPCT 6.6*     Recent Labs  09/05/15 1152  TSH 3.801        Component Value Date/Time   ESRSEDRATE 40* 05/25/2007 0954    Thyroid function studies  Recent Labs  09/05/15 1152  TSH 3.801    Radiology Studies:   Ct Hip Right Wo Contrast  09/05/2015  COMPARISON:  X-ray right hip 12/19/2014 FINDINGS: There is a mildly comminuted pathologic fracture of the proximal right femoral diaphysis transfixed with an intra medullary nail and cannulated femoral neck screw. There is mild surrounding callus formation. There are multiple lytic lesions involving the right ilium, right acetabulum, right femoral head and right ischium. There is a subtle lucency through the cortex of the right ischium which may reflect a nondisplaced fracture a (image 322/ series 3). There is a small hematoma in the subcutaneous fat measuring 2.5 x 4 cm likely postsurgical. There is no other right hip fracture or dislocation. IMPRESSION: 1. Mildly comminuted pathologic fracture of the proximal right femoral diaphysis transfixed with an intra medullary nail and cannulated femoral neck screw. There is mild  surrounding callus formation. 2. Multiple lytic lesions involving the right ilium, right acetabulum, right femoral head and right ischium consistent with multiple myeloma. Possible subtle nondisplaced pathologic fracture of the the right ischium (image 322/ series 3). Electronically Signed   By: Kathreen Devoid   On: 09/05/2015 14:09    Dg Femur, Min 2 Views Right  09/05/2015  CLINICAL DATA:  Right hip pain. Multiple myeloma. Right hip pinning 08/22/2015 EXAM: RIGHT FEMUR 2 VIEWS COMPARISON:  08/21/2015 and 08/22/2015 FINDINGS: Compression screw and locking intra medullary rod right femur. Rod extends into the distal femur. There are 2 screws in the distal femur.  Lytic lesion subtrochanteric femur with pathologic fracture. Fracture displacement has progressed slightly since 08/22/2015. No other fracture. No other lytic lesion identified in the femur Lytic lesion right ischium  unchanged compatible with myeloma. Right hip joint normal. IMPRESSION: Right femur screw and rod in good position Lytic lesion proximal femur with pathologic fracture. Fracture displacement slightly increased from 08/22/2015. Electronically Signed   By: Franchot Gallo M.D.   On: 09/05/2015 08:33    ASSESSMENT AND PLAN:  Multiple myeloma MRI shows some mild disease progression of the L1 vertebral body. No evidence of neurological deficit on exam is detected and there were no evidence of cord compression She underwent radiation in the past She is not currently on Revlimid She had been recently discharged on 10/24 due to pathological right femoral fracture requiring nailing procedure She was readmitted on 11/3 due to subtle, nondisplaced pathological fracture in the right ischium as seen on CT of right hip without contrast She received supportive therapy with IV pain meds She wishes no operative management at this time She is to be discharged soon with right knee immobilizer soon I recommend return appt to [facility] for further management  Bilateral low back pain without sciatica She has worsening pain due to focal progression of disease She is currently following another physician for pain management.  She felt that recent dexamethasone appears to have helped with the pain and she continues her same.  Smoker Spent some time counseling the patient the importance of tobacco cessation. She is currently not interested to quit now.  Essential hypertension Her blood pressure is grossly elevated which is suspect was exacerbated by back pain, steroids Continue present therapy  Anemia Due to recent chemotherapy, malnutrition, dilution CT right thigh negative for hematoma No other  bleeding issues are reported No transfusion is indicated at this time Monitor counts closely Transfuse blood to maintain a Hb of 8 g or if the patient is acutely bleeding  Thrombocytopenia This is due to malignancy, dilution, polypharmacy No bleeding issues are noted Monitor counts closely No transfusion is indicated at this time Transfuse 1 unit of platelets if count is less or equal than 10,000 or 20,000 if the patient is acutely bleeding Hold Lovenox if  platelets drop to less than 50,000  DVT prophylaxis On Lovenox  Full Code   Other medical issues as per admitting team   [facility], PA-C 09/06/2015, 10:18 AM  Bronson, Rashawnda Gaba, MD 09/06/2015

## 2015-09-06 NOTE — Progress Notes (Signed)
Physical Therapy Treatment Patient Details Name: Kendra Johns MRN: [id number] DOB: December 26, 1950 Today's Date: 09/06/2015    History of Present Illness Pt is a 64 y/o female with a PMH of depression, HTN, hep B and C, OA, multiple myeloma s/p bone marrow/stem cell transplant in 2009 with disease relapse in 2012. Second bone marrow transplant in 2013. Pt was recently admitted for a pathological fracture to her R femur for which she underwent ORIF. Felt a "pop" in the right thigh when getting out of her wheelchair at home, and presented to the ED due to pain. CT revealed further displacement of the fracture and distal interlock screw, as well as possible acute right ischial fracture.    PT Comments    Patient evaluated by Physical Therapy with no further acute PT needs identified. All education has been completed and the patient has no further questions. At the time of PT eval pt was able to perform transfers and ambulation with supervision for safety. No physical assistance was provided throughout session. Pt declining PT follow-up (SNF and HHPT), however do feel that outpatient PT could be beneficial if pt agreeable. See below for any follow-up Physical Therapy or equipment needs. PT is signing off. Thank you for this referral.   Follow Up Recommendations  No PT follow up;Supervision for mobility/OOB     Equipment Recommendations  None recommended by PT    Recommendations for Other Services       Precautions / Restrictions Precautions Precautions: Fall Required Braces or Orthoses: Knee Immobilizer - Right Restrictions Weight Bearing Restrictions: Yes RLE Weight Bearing: Weight bearing as tolerated    Mobility  Bed Mobility Overal bed mobility: Needs Assistance Bed Mobility: Supine to Sit     Supine to sit: Supervision     General bed mobility comments: Pt able to manage RLE on/off bed without assist.   Transfers Overall transfer level: Needs assistance Equipment used:  Rolling walker (2 wheeled) Transfers: Sit to/from Stand Sit to Stand: Supervision         General transfer comment: Pt demonstrated proper hand placement on seated surface for safety. No assist required.   Ambulation/Gait Ambulation/Gait assistance: Min guard;Supervision Ambulation Distance (Feet): 125 Feet Assistive device: Rolling walker (2 wheeled) Gait Pattern/deviations: Step-to pattern;Step-through pattern;Decreased stride length;Trunk flexed Gait velocity: decreased Gait velocity interpretation: Below normal speed for age/gender General Gait Details: Frequent step-through gait pattern with good sequencing with the RW. Pt was cued for WBAT status and appeared to be putting a fair amount of weight through UE's for support.    Stairs            Wheelchair Mobility    Modified Rankin (Stroke Patients Only)       Balance Overall balance assessment: Needs assistance Sitting-balance support: Feet supported;No upper extremity supported Sitting balance-Leahy Scale: Good     Standing balance support: Bilateral upper extremity supported;During functional activity Standing balance-Leahy Scale: Poor Standing balance comment: Requires UE support for dynamic standing balance.                     Cognition Arousal/Alertness: Awake/alert Behavior During Therapy: WFL for tasks assessed/performed Overall Cognitive Status: Within Functional Limits for tasks assessed                      Exercises      General Comments        Pertinent Vitals/Pain Pain Assessment: 0-10 Pain Score: 5  Pain Location: R thigh Pain Descriptors /  Indicators: Discomfort Pain Intervention(s): Limited activity within patient's tolerance;Monitored during session;Repositioned    Home Living Family/patient expects to be discharged to:: Private residence Living Arrangements: Spouse/significant other;Other relatives Available Help at Discharge: Family;Available  PRN/intermittently Type of Home: House Home Access: Stairs to enter Entrance Stairs-Rails: Can reach both Home Layout: One level Home Equipment: Walker - 4 wheels;Shower seat;Hand held Tourist information centre manager - 2 wheels      Prior Function Level of Independence: Independent with assistive device(s)      Comments: With RW. Independent with ADL's   PT Goals (current goals can now be found in the care plan section) Acute Rehab PT Goals PT Goal Formulation: All assessment and education complete, DC therapy    Frequency       PT Plan      Co-evaluation             End of Session Equipment Utilized During Treatment: Gait belt;Right knee immobilizer Activity Tolerance: Patient tolerated treatment well Patient left: in chair;with call bell/phone within reach     Time: 1401-1425 PT Time Calculation (min) (ACUTE ONLY): 24 min  Charges:  $Gait Training: 8-22 mins                    G Codes:      Rolinda Roan 2015-09-18, 2:50 PM  Rolinda Roan, PT, DPT Acute Rehabilitation Services Pager: [phone]

## 2015-09-06 NOTE — Discharge Summary (Addendum)
Physician Discharge Summary  Kendra Johns TTS:[id number] DOB: [birth year] DOA: 09/05/2015  PCP: Kendra Reddish, MD  Admit date: 09/05/2015 Discharge date: 09/06/2015  Recommendations for Outpatient Follow-up:  1. Home with home health services.  Patient adamantly declines SNF.   2. F/u with Oncology in 2 weeks for repeat CBC, ongoing management of MM 3. F/u with orthopedic surgery in 1 week for reevaluation 4. 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 Subsequent treatment with chemotherapy then a second bone marrow transplant in 2013. Unfortunately she's had disease progression and was treated with Revlimid although patient reports she is no longer taking this and only taking Decadron. Recent MRI of the spine showed mild progression of lumbar (L1) tumor. She was recently admitted for a pathological fracture to her right femur for which she underwent ORIF.  She was discharged to SNF  where she felt she did not receive much therapy.  She signed herself out and went home where she felt she was more active.  At home, she "turned the wrong way and felt a pop in her right thigh" resulting in excruciating pain and subsequent progressive swelling.  CT demonstrated a mildly comminuted pathologic fracture of the proximal right femoral diaphysis transfixed with IM nail and femoral neck screw, multiple lytic lesions and a possible subtle nondisplaced pathologic fracture of the right ischium.  She was admitted for pain control.     Hospital Course:   Possible acute right ischial fracture after recent pathologic fracture of right femur secondary to MM.  She was seen by orthopedic surgery who reviewed her options.  She elected for knee immobilizer for comfort and pain management.  She was given her home dose of ms contin and given a few increased doses of MSIR up to 55m which improved her pain.  She adamantly declined SNF and was therefore sent home with home health services.  She has family support at home and nursing staff said that [facility] after her knee immobilizer was placed.  She was not given any new pain medication prescriptions because she has a pain contract and was advised that per her prescription information, she may increase her MSIR to 43m(one and a half tabs) every two hours as needed for pain control.    MM with progression.  She will follow up with oncology and continue her decadron in the meantime.  Normocytic anemia due to marrow suppression from MM and medications.  Her hemoglobin is 2gm lower than her baseline but this may be secondary to some additional suppression from her recent ORIF.  She will follow up with oncology in a few weeks for repeat CBC and advised to seek medical attention sooner if she notices blood in her stools, lightheadedness, syncope, or worsening fatigue/SOB.     Essential  hypertension, poorly controlled in setting of acute and intractable pain.  She continued Lopressor and I suspect her BP will trend down as her pain improves and she heals from this event.     Thrombocytopenia due to drugs -Platelets stable and at baseline   Weight gain due to medication.  Weight October 2015 was 119 pounds and current weight is 146 pounds.  In the setting of progressive cancer, I am not as worried about this weight gain.  She is still in the normal to mildly overweight category.     Chronic hepatitis B & C ([place]) -LFTs October 2016 within normal limits   Hypothyroidism, TSH 3.8 on 11/3, continue synthroid.    Tobacco abuse, requested nicotine patch.  Smokes half a pack per day.    Procedures:  CT right hip  Consultations:  Orthopedics   Oncology  Discharge Exam: Filed Vitals:   09/06/15 1300  BP: 176/92  Pulse: 59  Temp: 98.2 F (36.8 C)  Resp: 18   Filed Vitals:   09/05/15 2044 09/06/15 0030 09/06/15 0450 09/06/15 1300  BP: 156/88 160/90 164/92 176/92  Pulse: 78 80 58 59  Temp: 98 F (36.7 C) 98.1 F (36.7 C) 98.3 F (36.8 C) 98.2 F (36.8 C)  TempSrc: Oral Oral Oral   Resp: _0 SpO2: 97% 97% 100% 100%    General: adult female, NAD Cardiovascular: RRR, 2/6 systolic murmur at the RSB Respiratory: CTAB, no increased WOB ABD:  NABS, soft, ND/NT MSK:  Right leg incisions in upper and lower thigh with well-approximated margins and no surrounding erythema or induration.  < 2 sec CR  Neuro:  3/5 strength bilateral lower extremities.  Sensation intact to light touch.  Strength limited by pain  Discharge Instructions      Discharge Instructions    Call MD for:  difficulty breathing, headache or visual disturbances    Complete by:  As directed      Call MD for:  extreme fatigue    Complete by:  As directed      Call MD for:  hives    Complete by:  As directed      Call MD for:  persistant dizziness or light-headedness    Complete  by:  As directed      Call MD for:  persistant nausea and vomiting    Complete by:  As directed      Call MD for:  redness, tenderness, or signs of infection (pain, swelling, redness, odor or green/yellow discharge around incision site)    Complete by:  As directed      Call MD for:  severe uncontrolled pain    Complete by:  As directed      Call MD for:  temperature >100.4    Complete by:  As directed      Diet - low sodium heart healthy    Complete by:  As directed      Discharge instructions    Complete by:  As directed   Wear knee immobilizer for comfort and work on physical  therapy activities at home.  Please check your blood pressures at home if able and you will need to follow up with your primary care doctor in about 2 weeks for a blood pressure check.  For your anemia and multiple myeloma, please follow up with the oncology clinic in about 1-2 weeks.     Driving Restrictions    Complete by:  As directed   No driving until cleared by doctor.     Increase activity slowly    Complete by:  As directed             Medication List    STOP taking these medications        REVLIMID 25 MG capsule  Generic drug:  lenalidomide     REVLIMID 5 MG capsule  Generic drug:  lenalidomide      TAKE these medications        aspirin 81 MG tablet  Take 81 mg by mouth daily.     bisacodyl 10 MG suppository  Commonly known as:  DULCOLAX  Place 10 mg rectally. If not relieved MOM give suppository rectally one dose in 24 hours as needed for constipation     cyclobenzaprine 10 MG tablet  Commonly known as:  FLEXERIL  Take 1 tablet (10 mg total) by mouth 3 (three) times daily as needed for muscle spasms.     dexamethasone 4 MG tablet  Commonly known as:  DECADRON  TAKE 1 TABLET (4 MG TOTAL) BY MOUTH DAILY.     enoxaparin 40 MG/0.4ML injection  Commonly known as:  LOVENOX  Inject 0.4 mLs (40 mg total) into the skin daily.     ipratropium 17 MCG/ACT inhaler  Commonly known as:   ATROVENT HFA  Inhale 2 puffs into the lungs every 4 (four) hours as needed for wheezing.     levothyroxine 50 MCG tablet  Commonly known as:  SYNTHROID  Take 1 tablet (50 mcg total) by mouth daily before breakfast.     lidocaine-prilocaine cream  Commonly known as:  EMLA  Apply 1 application topically as needed.     magic mouthwash Soln  Take 10 mLs by mouth 4 (four) times daily as needed for mouth pain. Swish and spit  Or  Swish and swallow.     magnesium hydroxide 400 MG/5ML suspension  Commonly known as:  MILK OF MAGNESIA  Take 30 mLs by mouth daily as needed for mild constipation (constipation).     metoprolol 100 MG tablet  Commonly known as:  LOPRESSOR  Take 1 tablet (100 mg total) by mouth 2 (two) times daily.     morphine 30 MG tablet  Commonly known as:  MSIR  Take 1-1.5 tablets (30-45 mg total) by mouth every 2 (two) hours as needed for moderate pain or severe pain.     morphine 15 MG 12 hr tablet  Commonly known as:  MS CONTIN  Take 5 tablets (75 mg total) by mouth every 8 (eight) hours.     RA SALINE ENEMA RE  Place rectally. If not relieved by suppository give enema rectally one dose in 24 hours as needed for constipation     senna-docusate 8.6-50 MG tablet  Commonly known as:  Senokot-S  Take 1 tablet by mouth daily.     sucralfate 1 G tablet  Commonly known as:  CARAFATE  Take 1 tablet (1 g total) by mouth 4 (four) times daily.     Vitamin D3 5000 UNITS Caps  Take 5,000 Units  by mouth daily.       Follow-up Information    Follow up with Kendra Reddish, MD. Schedule an appointment as soon as possible for a visit in 2 weeks.   Specialty:  Family Medicine   Contact information:   [place] [address] [phone]       Follow up with [facility], NI, MD. Schedule an appointment as soon as possible for a visit in 2 weeks.   Specialty:  Hematology and Oncology   Contact information:   [place]  [id number] [phone]       Follow up with MURPHY, Ernesta Amble, MD In 2 weeks.   Specialty:  Orthopedic Surgery   Contact information:   [address] [place] [address] [phone]        The results of significant diagnostics from this hospitalization (including imaging, microbiology, ancillary and laboratory) are listed below for reference.    Significant Diagnostic Studies: Pelvis Portable  08/22/2015  CLINICAL DATA:  Right Hip fracture requiring operative repair ([place]), post op EXAM: PORTABLE PELVIS 1-2 VIEWS COMPARISON:  08/22/2015 FINDINGS: Status post right IM nail/lag screw fixation of right sub trochanteric pathologic hip fracture. Femoral head appears located in the acetabulum. Previous vertebroplasty. IMPRESSION: Status post ORIF right hip. Electronically Signed   By: Nolon Nations M.D.   On: 08/22/2015 17:13   Ct Hip Right Wo Contrast  09/05/2015  CLINICAL DATA:  Right hip pain. EXAM: CT OF THE RIGHT HIP WITHOUT CONTRAST TECHNIQUE: Multidetector CT imaging of the right hip was performed according to the standard protocol. Multiplanar CT image reconstructions were also generated. COMPARISON:  X-ray right hip 12/19/2014 FINDINGS: There is a mildly comminuted pathologic fracture of the proximal right femoral diaphysis transfixed with an intra medullary nail and cannulated femoral neck screw. There is mild surrounding callus formation. There are multiple lytic lesions involving the right ilium, right acetabulum, right femoral head and right ischium. There is a subtle lucency through the cortex of the right ischium which may reflect a nondisplaced fracture a (image 322/ series 3). There is a small hematoma in the subcutaneous fat measuring 2.5 x 4 cm likely postsurgical. There is no other right hip fracture or dislocation. IMPRESSION: 1. Mildly comminuted pathologic fracture of the proximal right femoral diaphysis transfixed with an intra medullary nail and  cannulated femoral neck screw. There is mild surrounding callus formation. 2. Multiple lytic lesions involving the right ilium, right acetabulum, right femoral head and right ischium consistent with multiple myeloma. Possible subtle nondisplaced pathologic fracture of the the right ischium (image 322/ series 3). Electronically Signed   By: Kathreen Devoid   On: 09/05/2015 14:09   Dg C-arm 1-60 Min  08/22/2015  CLINICAL DATA:  Multiple myeloma, with a pathologic fracture. RIGHT leg pain. EXAM: RIGHT FEMUR 2 VIEWS; DG C-ARM 61-120 MIN COMPARISON:  Plain films most recent 08/21/2015. FINDINGS: Intra medullary rod with compression screw has been placed across the proximal femoral subtrochanteric pathologic fracture. Satisfactory position and alignment. IMPRESSION: As above. Electronically Signed   By: Staci Righter M.D.   On: 08/22/2015 16:31   Dg Hip Unilat With Pelvis 2-3 Views Right  09/05/2015  CLINICAL DATA:  Right hip pain. Multiple myeloma. Recent right hip pinning 08/22/2015 EXAM: DG HIP (WITH OR WITHOUT PELVIS) 2-3V RIGHT COMPARISON:  08/22/2015, 08/14/2015 FINDINGS: Compression screw and locking intra medullary rod in the right femur. Hardware in satisfactory position. Lytic lesion in the subtrochanteric femur  with pathologic transverse fracture. Slight increase in fracture displacement compared with 08/22/2015. Lytic lesion right ischium  is unchanged from the prior study. Cement vertebral augmentation  L5 and the right sacrum unchanged. IMPRESSION: Lytic lesion proximal right femur with pathologic fracture. Slight increase in displacement of the fracture compared with 08/22/2015. Compression screw and rod remain in satisfactory position. Lytic lesion right ischium unchanged. Electronically Signed   By: Franchot Gallo M.D.   On: 09/05/2015 08:31   Dg Hip Unilat With Pelvis 2-3 Views Right  08/14/2015  CLINICAL DATA:  Pain.  Multiple myeloma. EXAM: DG HIP (WITH OR WITHOUT PELVIS) 2-3V RIGHT COMPARISON:   None. FINDINGS: Degenerative changes lumbar spine and both hips. Diffuse progress osteopenia. This may be secondary to myeloma. Prominent lytic lesions proximal right femoral shaft. Scratched lytic lesion right iliac wing cannot be excluded. Methylmethacrylate again noted L5 and in the right sacral wing. Pelvic calcifications consistent phleboliths . IMPRESSION: Progressive diffuse osteopenia with lytic lesions in the right proximal femur and and possibly the right iliac wing. Multiple myeloma could present in this fashion . No evidence of fracture. The proximal right femoral shaft lytic lesion is prominent and would be prone to fracture. Orthopedic/radiation therapy consultation should be considered . Electronically Signed   By: Marcello Moores  Register   On: 08/14/2015 13:38   Dg Femur 1v Right  08/21/2015  CLINICAL DATA:  Multiple myeloma, known RIGHT femoral tumor, increased pain for 1 day EXAM: RIGHT FEMUR 1 VIEW COMPARISON:  08/14/2015 FINDINGS: Osseous demineralization. Poorly defined lytic lesions are identified at the proximal and proximal/mid RIGHT femoral diaphysis compatible with multiple myeloma. Endosteal cortical thinning at the larger more proximal femoral lytic lesion. A small amount of periosteal reaction is identified adjacent to the more cranial of the proximal femoral lesions, located in the subtrochanteric region. Additionally, subtle linear cortical lucency is seen in the medial cortex of the proximal RIGHT femur inferior to the lesser trochanter likely representing a developing pathologic fracture. Tiny rounded lytic lesion of the distal RIGHT femoral metaphysis is noted. Knee and hip joint alignments normal. No additional fracture, dislocation or bone destruction. IMPRESSION: Lytic lesions within the RIGHT femoral diaphysis compatible with multiple myeloma. Periosteal reaction and subtle cortical linear lucency at thinned medial cortex of the proximal RIGHT femoral diaphysis inferior to the  lesser trochanter likely representing a developing pathologic fracture. Electronically Signed   By: Lavonia Dana M.D.   On: 08/21/2015 16:45   Dg Femur, Min 2 Views Right  09/05/2015  CLINICAL DATA:  Right hip pain. Multiple myeloma. Right hip pinning 08/22/2015 EXAM: RIGHT FEMUR 2 VIEWS COMPARISON:  08/21/2015 and 08/22/2015 FINDINGS: Compression screw and locking intra medullary rod right femur. Rod extends into the distal femur. There are 2 screws in the distal femur. Lytic lesion subtrochanteric femur with pathologic fracture. Fracture displacement has progressed slightly since 08/22/2015. No other fracture. No other lytic lesion identified in the femur Lytic lesion right ischium  unchanged compatible with myeloma. Right hip joint normal. IMPRESSION: Right femur screw and rod in good position Lytic lesion proximal femur with pathologic fracture. Fracture displacement slightly increased from 08/22/2015. Electronically Signed   By: Franchot Gallo M.D.   On: 09/05/2015 08:33   Dg Femur, Min 2 Views Right  08/22/2015  CLINICAL DATA:  Multiple myeloma, with a pathologic fracture. RIGHT leg pain. EXAM: RIGHT FEMUR 2 VIEWS; DG C-ARM 61-120 MIN COMPARISON:  Plain films most recent 08/21/2015. FINDINGS: Intra medullary rod with compression screw has been placed across  the proximal femoral subtrochanteric pathologic fracture. Satisfactory position and alignment. IMPRESSION: As above. Electronically Signed   By: Staci Righter M.D.   On: 08/22/2015 16:31   Dg Femur, Min 2 Views Right  08/14/2015  CLINICAL DATA:  Two weeks of right knee pain without reported trauma; multiple myeloma not in remission, currently on chemotherapy. EXAM: RIGHT FEMUR 2 VIEWS COMPARISON:  Images of the right femur and right knee dated May 24, 2014 FINDINGS: There is a5 mm diameter well-circumscribed lucency in the distal third of shaft of the right femur which is stable. Lucencies previously demonstrated more inferiorly are less well  defined and may have increased in size. No cortical disruption is observed. The knee joint compartments appear reasonably well maintained. There is beaking of the tibial spines and tiny spurs from the articular margins of the patella. IMPRESSION: Lucencies in the distal third of the femur which likely reflect myelomatous involvement. There are mild degenerative changes of the right knee. Electronically Signed   By: David  Martinique M.D.   On: 08/14/2015 13:37    Microbiology: No results found for this or any previous visit (from the past 240 hour(s)).   Labs: Basic Metabolic Panel:  Recent Labs Lab 09/05/15 0850  NA 139  K 3.9  CL 107  CO2 25  GLUCOSE 93  BUN 16  CREATININE 0.69  CALCIUM 8.6*   Liver Function Tests: No results for input(s): AST, ALT, ALKPHOS, BILITOT, PROT, ALBUMIN in the last 168 hours. No results for input(s): LIPASE, AMYLASE in the last 168 hours. No results for input(s): AMMONIA in the last 168 hours. CBC:  Recent Labs Lab 09/05/15 0850  WBC 6.4  NEUTROABS 4.1  HGB 10.3*  HCT 32.0*  MCV 97.9  PLT 143*   Cardiac Enzymes: No results for input(s): CKTOTAL, CKMB, CKMBINDEX, TROPONINI in the last 168 hours. BNP: BNP (last 3 results) No results for input(s): BNP in the last 8760 hours.  ProBNP (last 3 results) No results for input(s): PROBNP in the last 8760 hours.  CBG: No results for input(s): GLUCAP in the last 168 hours.  Time coordinating discharge: 35 minutes  Signed:  Murtaza Shell  [facility] 09/06/2015, 2:17 PM

## 2015-09-09 ENCOUNTER — Ambulatory Visit: Payer: Medicare Other

## 2015-09-09 ENCOUNTER — Telehealth: Payer: Self-pay | Admitting: *Deleted

## 2015-09-09 DIAGNOSIS — Z79891 Long term (current) use of opiate analgesic: Secondary | ICD-10-CM | POA: Diagnosis not present

## 2015-09-09 DIAGNOSIS — G894 Chronic pain syndrome: Secondary | ICD-10-CM | POA: Diagnosis not present

## 2015-09-09 DIAGNOSIS — M5431 Sciatica, right side: Secondary | ICD-10-CM | POA: Diagnosis not present

## 2015-09-09 DIAGNOSIS — K5900 Constipation, unspecified: Secondary | ICD-10-CM | POA: Diagnosis not present

## 2015-09-09 NOTE — Telephone Encounter (Signed)
Informed pt that [facility] told Dr. Alvy Bimler they would see pt on this Thursday so we r/s her appt here w/ Dr. Alvy Bimler to Monday 11/4 at 11:45 am.   Pt verbalized understanding d/t to see Dr. Alvy Bimler, but says she has not heard from [facility] about any appts.   Gave pt the phone number for Dr. Kendell Bane office to call for appt time.   I also called [facility] and LVM for NP,  Loel Lofty, asking her to contact pt w/ appt date/time.

## 2015-09-09 NOTE — Telephone Encounter (Signed)
-----   Message from Heath Lark, MD sent at 09/09/2015  7:05 AM EST ----- Regarding: move appt Kendra Johns, [facility] called me and said they will get patient to be seen on Thursday I cancelled her appointment on Thursday Can you reschedule just appt only for 11/14 at 1145 am? Thanks

## 2015-09-10 ENCOUNTER — Encounter: Payer: Self-pay | Admitting: Internal Medicine

## 2015-09-10 ENCOUNTER — Ambulatory Visit: Payer: Medicare Other

## 2015-09-10 ENCOUNTER — Encounter: Payer: Self-pay | Admitting: Gastroenterology

## 2015-09-10 ENCOUNTER — Ambulatory Visit
Admission: RE | Admit: 2015-09-10 | Discharge: 2015-09-10 | Disposition: A | Discharge status: Home / Self Care | Payer: Medicare Other | Source: Ambulatory Visit | Attending: Radiation Oncology | Admitting: Radiation Oncology

## 2015-09-10 ENCOUNTER — Telehealth: Payer: Self-pay | Admitting: Family Medicine

## 2015-09-10 ENCOUNTER — Ambulatory Visit: Payer: Self-pay | Admitting: Family Medicine

## 2015-09-10 DIAGNOSIS — C9002 Multiple myeloma in relapse: Secondary | ICD-10-CM | POA: Diagnosis not present

## 2015-09-10 NOTE — Telephone Encounter (Signed)
Patient no showed today. Thought appointment was on Thursday. Has a ton of appointments related to her multiple myeloma and hard to keep track of. i already told keba to not charge for missed appointment.   Patient mentions some URI symptoms and is seeing [facility] tomorrow- asked her to have them evaluate and potentially treat to save her a trip over here on Thursday. She will try mucinex starting tonight per my suggestion

## 2015-09-11 ENCOUNTER — Emergency Department (HOSPITAL_COMMUNITY): Payer: Medicare Other

## 2015-09-11 ENCOUNTER — Inpatient Hospital Stay (HOSPITAL_COMMUNITY)
Admission: EM | Admit: 2015-09-11 | Discharge: 2015-09-17 | DRG: 191 | Disposition: A | Discharge status: Home Health | Payer: Medicare Other | Attending: Family Medicine | Admitting: Family Medicine

## 2015-09-11 ENCOUNTER — Encounter (HOSPITAL_COMMUNITY): Payer: Self-pay | Admitting: Emergency Medicine

## 2015-09-11 ENCOUNTER — Ambulatory Visit
Admission: RE | Admit: 2015-09-11 | Discharge: 2015-09-11 | Disposition: A | Discharge status: Home / Self Care | Payer: Medicare Other | Source: Ambulatory Visit | Attending: Radiation Oncology | Admitting: Radiation Oncology

## 2015-09-11 DIAGNOSIS — Z79899 Other long term (current) drug therapy: Secondary | ICD-10-CM | POA: Diagnosis not present

## 2015-09-11 DIAGNOSIS — K589 Irritable bowel syndrome without diarrhea: Secondary | ICD-10-CM | POA: Diagnosis present

## 2015-09-11 DIAGNOSIS — I10 Essential (primary) hypertension: Secondary | ICD-10-CM | POA: Diagnosis present

## 2015-09-11 DIAGNOSIS — J449 Chronic obstructive pulmonary disease, unspecified: Secondary | ICD-10-CM | POA: Diagnosis present

## 2015-09-11 DIAGNOSIS — E785 Hyperlipidemia, unspecified: Secondary | ICD-10-CM | POA: Diagnosis present

## 2015-09-11 DIAGNOSIS — Z9481 Bone marrow transplant status: Secondary | ICD-10-CM | POA: Diagnosis not present

## 2015-09-11 DIAGNOSIS — E559 Vitamin D deficiency, unspecified: Secondary | ICD-10-CM | POA: Diagnosis present

## 2015-09-11 DIAGNOSIS — Z885 Allergy status to narcotic agent status: Secondary | ICD-10-CM

## 2015-09-11 DIAGNOSIS — Z886 Allergy status to analgesic agent status: Secondary | ICD-10-CM

## 2015-09-11 DIAGNOSIS — J441 Chronic obstructive pulmonary disease with (acute) exacerbation: Secondary | ICD-10-CM | POA: Diagnosis not present

## 2015-09-11 DIAGNOSIS — B182 Chronic viral hepatitis C: Secondary | ICD-10-CM | POA: Diagnosis present

## 2015-09-11 DIAGNOSIS — Z7901 Long term (current) use of anticoagulants: Secondary | ICD-10-CM

## 2015-09-11 DIAGNOSIS — F17200 Nicotine dependence, unspecified, uncomplicated: Secondary | ICD-10-CM | POA: Diagnosis not present

## 2015-09-11 DIAGNOSIS — C9000 Multiple myeloma not having achieved remission: Secondary | ICD-10-CM | POA: Diagnosis not present

## 2015-09-11 DIAGNOSIS — G4730 Sleep apnea, unspecified: Secondary | ICD-10-CM | POA: Diagnosis present

## 2015-09-11 DIAGNOSIS — M1990 Unspecified osteoarthritis, unspecified site: Secondary | ICD-10-CM | POA: Diagnosis present

## 2015-09-11 DIAGNOSIS — C9001 Multiple myeloma in remission: Secondary | ICD-10-CM | POA: Diagnosis present

## 2015-09-11 DIAGNOSIS — Z79891 Long term (current) use of opiate analgesic: Secondary | ICD-10-CM

## 2015-09-11 DIAGNOSIS — Z7952 Long term (current) use of systemic steroids: Secondary | ICD-10-CM

## 2015-09-11 DIAGNOSIS — Z7982 Long term (current) use of aspirin: Secondary | ICD-10-CM

## 2015-09-11 DIAGNOSIS — K279 Peptic ulcer, site unspecified, unspecified as acute or chronic, without hemorrhage or perforation: Secondary | ICD-10-CM | POA: Diagnosis present

## 2015-09-11 DIAGNOSIS — M84551K Pathological fracture in neoplastic disease, right femur, subsequent encounter for fracture with nonunion: Secondary | ICD-10-CM | POA: Diagnosis present

## 2015-09-11 DIAGNOSIS — F419 Anxiety disorder, unspecified: Secondary | ICD-10-CM | POA: Diagnosis present

## 2015-09-11 DIAGNOSIS — Z806 Family history of leukemia: Secondary | ICD-10-CM

## 2015-09-11 DIAGNOSIS — Z888 Allergy status to other drugs, medicaments and biological substances status: Secondary | ICD-10-CM

## 2015-09-11 DIAGNOSIS — K219 Gastro-esophageal reflux disease without esophagitis: Secondary | ICD-10-CM | POA: Diagnosis present

## 2015-09-11 DIAGNOSIS — Z801 Family history of malignant neoplasm of trachea, bronchus and lung: Secondary | ICD-10-CM

## 2015-09-11 DIAGNOSIS — R0602 Shortness of breath: Secondary | ICD-10-CM | POA: Diagnosis not present

## 2015-09-11 DIAGNOSIS — F329 Major depressive disorder, single episode, unspecified: Secondary | ICD-10-CM | POA: Diagnosis present

## 2015-09-11 DIAGNOSIS — R0600 Dyspnea, unspecified: Secondary | ICD-10-CM

## 2015-09-11 DIAGNOSIS — Z86010 Personal history of colonic polyps: Secondary | ICD-10-CM

## 2015-09-11 DIAGNOSIS — F17210 Nicotine dependence, cigarettes, uncomplicated: Secondary | ICD-10-CM | POA: Diagnosis not present

## 2015-09-11 LAB — BLOOD GAS, ARTERIAL
Acid-base deficit: 1.8 mmol/L (ref 0.0–2.0)
Bicarbonate: 22 mEq/L (ref 20.0–24.0)
Drawn by: 308601
O2 Content: 8 L/min
O2 Saturation: 99.4 %
Patient temperature: 98.6
TCO2: 19.9 mmol/L (ref 0–100)
pCO2 arterial: 36.4 mmHg (ref 35.0–45.0)
pH, Arterial: 7.399 (ref 7.350–7.450)
pO2, Arterial: 165 mmHg — ABNORMAL HIGH (ref 80.0–100.0)

## 2015-09-11 LAB — BASIC METABOLIC PANEL
ANION GAP: 7 (ref 5–15)
BUN: 16 mg/dL (ref 6–20)
CALCIUM: 9.1 mg/dL (ref 8.9–10.3)
CHLORIDE: 107 mmol/L (ref 101–111)
CO2: 27 mmol/L (ref 22–32)
Creatinine, Ser: 0.69 mg/dL (ref 0.44–1.00)
GFR calc non Af Amer: >60 mL/min (ref >60)
Glucose, Bld: 106 mg/dL — ABNORMAL HIGH (ref 65–99)
Potassium: 4.3 mmol/L (ref 3.5–5.1)
Sodium: 141 mmol/L (ref 135–145)

## 2015-09-11 LAB — CBC
HCT: 38.1 % (ref 36.0–46.0)
HEMOGLOBIN: 12.1 g/dL (ref 12.0–15.0)
MCH: 31.2 pg (ref 26.0–34.0)
MCHC: 31.8 g/dL (ref 30.0–36.0)
MCV: 98.2 fL (ref 78.0–100.0)
Platelets: 156 10*3/uL (ref 150–400)
RBC: 3.88 MIL/uL (ref 3.87–5.11)
RDW: 17.8 % — ABNORMAL HIGH (ref 11.5–15.5)
WBC: 6.3 10*3/uL (ref 4.0–10.5)

## 2015-09-11 MED ORDER — IPRATROPIUM-ALBUTEROL 0.5-2.5 (3) MG/3ML IN SOLN
3.0000 mL | Freq: Once | RESPIRATORY_TRACT | Status: AC
  Administered 2015-09-11: 3 mL via RESPIRATORY_TRACT

## 2015-09-11 MED ORDER — ALBUTEROL SULFATE (2.5 MG/3ML) 0.083% IN NEBU
2.5000 mg | INHALATION_SOLUTION | Freq: Once | RESPIRATORY_TRACT | Status: AC
  Administered 2015-09-11: 2.5 mg via RESPIRATORY_TRACT

## 2015-09-11 MED ORDER — METHYLPREDNISOLONE SODIUM SUCC 125 MG IJ SOLR
125.0000 mg | Freq: Once | INTRAMUSCULAR | Status: AC
  Administered 2015-09-12: 125 mg via INTRAVENOUS
  Filled 2015-09-11: qty 2

## 2015-09-11 MED ORDER — ALBUTEROL (5 MG/ML) CONTINUOUS INHALATION SOLN
15.0000 mg/h | INHALATION_SOLUTION | Freq: Once | RESPIRATORY_TRACT | Status: AC
  Administered 2015-09-11: 15 mg/h via RESPIRATORY_TRACT

## 2015-09-11 NOTE — ED Notes (Signed)
Pt states 3 weeks ago she fractured her right femur  Pt states it was doing better but she twisted it a few days ago and the pain has gotten worse   Pt states she also has COPD and for the past couple of days she has been short of breath that has progressively been getting worse

## 2015-09-11 NOTE — ED Provider Notes (Signed)
By signing my name below, I, Kendra Johns, attest that this documentation has been prepared under the direction and in the presence of Kendra Owingeh, DO.  Electronically Signed: Forrestine Johns, ED Scribe. 09/11/2015. 11:33 PM.   TIME SEEN: 11:33 PM   CHIEF COMPLAINT:  Chief Complaint  Patient presents with  . Shortness of Breath     HPI:  HPI Comments: Kendra Johns is a 64 y.o. female with a PMHx of COPD, HTN, hyperlipidemia, and multiple myeloma on chemotherapy who presents to the Emergency Department complaining of constant, ongoing shortness of breath x 3-4 days. Associated productive cough consisting of brown sputum also reported. No aggravating or alleviating factors at this time. No at home treatments attempted prior to arrival. No recent fever, chills, nausea, vomiting, or diarrhea. No chest pain or lower extremity swelling. Kendra Johns denies any history of blood clots. No prior history of heart failure. Last chemotherapy treatment 3 weeks ago. Patient reports she is still smoking. Does not wear oxygen at home.  PCP: Kendra Reddish, MD   ONCOLOGIST: Kendra Lark, MD  ROS: See HPI Constitutional: no fever  Eyes: no drainage  ENT: no runny nose   Cardiovascular:  no chest pain  Resp: Positive SOB and cough GI: no vomiting GU: no dysuria Integumentary: no rash  Allergy: no hives  Musculoskeletal: no leg swelling  Neurological: no slurred speech ROS otherwise negative  PAST MEDICAL HISTORY/PAST SURGICAL HISTORY:  Past Medical History  Diagnosis Date  . Depression   . Hypertension   . Hepatitis C     genotype 1  . Diverticulosis of colon   . Multiple myeloma     kappa light chain, s/p high-dose chemo/stem cell tx - [facility]  . Hx of adenomatous colonic polyps 2007  . Anxiety   . Osteoarthritis   . Sleep apnea   . Hyperlipidemia   . GERD with stricture   . External hemorrhoids   . Hepatitis B virus infection   . IBS (irritable bowel syndrome)   . Helicobacter pylori  gastritis 2001    ? if treated  . Sciatic nerve pain     with Dr. Earle Johns  . Chronic low back pain   . Unspecified vitamin D deficiency 09/22/2013  . Allergy   . Fever 11/28/2013  . Dehydration 11/28/2013  . Nausea alone 01/22/2014  . Cough 08/02/2014  . DIVERTICULOSIS, COLON 08/31/2006  . DEPRESSION 08/31/2006    Qualifier: Diagnosis of  By: Kendra Chang MD, Kendra Johns    . Muscle cramp 11/21/2014  . Chronic fatigue 03/07/2015  . Vitamin D deficiency 05/28/2015  . S/P radiation therapy 06/05/15-06/18/15    C7-T1     MEDICATIONS:  Prior to Admission medications   Medication Sig Start Date End Date Taking? Authorizing Provider  aspirin 81 MG tablet Take 81 mg by mouth daily.    Historical Provider, MD  bisacodyl (DULCOLAX) 10 MG suppository Place 10 mg rectally. If not relieved MOM give suppository rectally one dose in 24 hours as needed for constipation    Historical Provider, MD  Cholecalciferol (VITAMIN D3) 5000 UNITS CAPS Take 5,000 Units by mouth daily.     Historical Provider, MD  cyclobenzaprine (FLEXERIL) 10 MG tablet Take 1 tablet (10 mg total) by mouth 3 (three) times daily as needed for muscle spasms. 08/26/15   Kendra Girt, DO  dexamethasone (DECADRON) 4 MG tablet TAKE 1 TABLET (4 MG TOTAL) BY MOUTH DAILY. 08/14/15   Kendra Lark, MD  enoxaparin (LOVENOX) 40  MG/0.4ML injection Inject 0.4 mLs (40 mg total) into the skin daily. 08/22/15   Kendra Claiborne Billings, PA-C  ipratropium (ATROVENT HFA) 17 MCG/ACT inhaler Inhale 2 puffs into the lungs every 4 (four) hours as needed for wheezing. 09/13/14   Kendra Lark, MD  levothyroxine (SYNTHROID) 50 MCG tablet Take 1 tablet (50 mcg total) by mouth daily before breakfast. 04/17/15   Kendra Lark, MD  lidocaine-prilocaine (EMLA) cream Apply 1 application topically as needed. Patient taking differently: Apply 1 application topically daily as needed (for port).  05/28/15   Kendra Lark, MD  magic mouthwash SOLN Take 10 mLs by mouth 4 (four) times daily as needed  for mouth pain. Swish and spit  Or  Swish and swallow. 07/10/15   Kendra Lark, MD  magnesium hydroxide (MILK OF MAGNESIA) 400 MG/5ML suspension Take 30 mLs by mouth daily as needed for mild constipation (constipation).     Historical Provider, MD  metoprolol (LOPRESSOR) 100 MG tablet Take 1 tablet (100 mg total) by mouth 2 (two) times daily. 08/26/15   Kendra Girt, DO  morphine (MS CONTIN) 15 MG 12 hr tablet Take 5 tablets (75 mg total) by mouth every 8 (eight) hours. 08/26/15   Kendra Girt, DO  morphine (MSIR) 30 MG tablet Take 1-1.5 tablets (30-45 mg total) by mouth every 2 (two) hours as needed for moderate pain or severe pain. Patient taking differently: Take 30-45 mg by mouth every 2 (two) hours as needed for moderate pain or severe pain. Take one tablet every 2 hours as needed for moderate pain 08/26/15   Kendra Bamberger Vann, DO  senna-docusate (SENOKOT-S) 8.6-50 MG tablet Take 1 tablet by mouth daily. 08/26/15   Kendra Girt, DO  Sodium Phosphates (RA SALINE ENEMA RE) Place rectally. If not relieved by suppository give enema rectally one dose in 24 hours as needed for constipation    Historical Provider, MD  sucralfate (CARAFATE) 1 G tablet Take 1 tablet (1 g total) by mouth 4 (four) times daily. Patient taking differently: Take 1 g by mouth 4 (four) times daily as needed (stomach ulcers/ pain).  06/17/15   Kendra Rudd, MD    ALLERGIES:  Allergies  Allergen Reactions  . Codeine Itching  . Ibuprofen Other (See Comments)    REACTION: gi trouble  . Albuterol Hives    Pt tolerated albuterol neb without issue 12/2014    SOCIAL HISTORY:  Social History  Substance Use Topics  . Smoking status: Current Every Day Smoker -- 0.25 packs/day for 44 years    Types: Cigarettes  . Smokeless tobacco: Never Used  . Alcohol Use: No    FAMILY HISTORY: Family History  Problem Relation Age of Onset  . Alcohol abuse    . Lung cancer Father     smoked  . Leukemia Mother   . Cirrhosis Sister   .  Colon cancer Neg Hx   . Lung cancer Sister     smoked    EXAM: BP 151/91 mmHg  Pulse 65  Temp(Src) 98 F (36.7 C) (Oral)  Resp 20  SpO2 95% CONSTITUTIONAL: Alert and oriented and responds appropriately to questions. Chronically ill appearing. HEAD: Normocephalic EYES: Conjunctivae clear, PERRL ENT: normal nose; no rhinorrhea; moist mucous membranes; pharynx without lesions noted NECK: Supple, no meningismus, no LAD  CARD: RRR; S1 and S2 appreciated; no murmurs, no clicks, no rubs, no gallops RESP: Tachypnic, moderate respiratory distress, speaking in short sentances, diffise inspiratory and expiratory wheezing, no rales, diffuse rhonchi breath  sounds, increase work of breathing ABD/GI: Normal bowel sounds; non-distended; soft, non-tender, no rebound, no guarding, no peritoneal signs BACK:  The back appears normal and is non-tender to palpation, there is no CVA tenderness EXT: Normal ROM in all joints; non-tender to palpation; no edema; normal capillary refill; no cyanosis, no calf tenderness or swelling    SKIN: Normal color for age and race; warm NEURO: Moves all extremities equally, sensation to light touch intact diffusely, cranial nerves II through XII intact PSYCH: The patient's mood and manner are appropriate. Grooming and personal hygiene are appropriate.  MEDICAL DECISION MAKING: Patient here with likely COPD exacerbation. Diffuse rhonchorous breath sounds with wheezing. She has not been hypoxic but does have significant increased work of breathing and is in moderate respiratory distress. We'll give continuous epidural treatments, Solu-Medrol, obtain labs and chest x-ray.  ED PROGRESS: 12:00 AM  Pt's ABG is reassuring but given her work of breathing, we'll start BiPAP.  Patient's breath sounds are improving, she has better aeration in her wheezing has decreased. Chest x-ray shows no pneumonia. Labs unremarkable. She is doing well BiPAP reports feeling better. Discussed with Dr.  Loleta Books with hospitalist service who agrees on admission to step down.    Patient reassessed by hospitalist and is now improving and doing well off BiPAP. We'll admit to telemetry bed.      EKG Interpretation  Date/Time:  Wednesday September 11 2015 22:07:46 EST Ventricular Rate:  68 PR Interval:  154 QRS Duration: 78 QT Interval:  401 QTC Calculation: 426 R Axis:   1 Text Interpretation:  Sinus rhythm Abnormal R-wave progression, early transition No significant change since last tracing Feb 2016 Confirmed by WARD,  DO, KRISTEN [phone]) on 09/11/2015 11:28:32 PM        CRITICAL CARE Performed by: Nyra Jabs   Total critical care time: 30 minutes  Critical care time was exclusive of separately billable procedures and treating other patients.  Critical care was necessary to treat or prevent imminent or life-threatening deterioration.  Critical care was time spent personally by me on the following activities: development of treatment plan with patient and/or surrogate as well as nursing, discussions with consultants, evaluation of patient's response to treatment, examination of patient, obtaining history from patient or surrogate, ordering and performing treatments and interventions, ordering and review of laboratory studies, ordering and review of radiographic studies, pulse oximetry and re-evaluation of patient's condition.    I personally performed the services described in this documentation, which was scribed in my presence. The recorded information has been reviewed and is accurate.   [place], DO 09/12/15 [phone]

## 2015-09-11 NOTE — Telephone Encounter (Signed)
Amanda please don't charge pt for no show visit yesterday, thanks.

## 2015-09-12 ENCOUNTER — Encounter (HOSPITAL_COMMUNITY): Payer: Self-pay | Admitting: Family Medicine

## 2015-09-12 ENCOUNTER — Ambulatory Visit
Admission: RE | Admit: 2015-09-12 | Discharge: 2015-09-12 | Disposition: A | Discharge status: Home / Self Care | Payer: Medicare Other | Source: Ambulatory Visit | Attending: Radiation Oncology | Admitting: Radiation Oncology

## 2015-09-12 ENCOUNTER — Ambulatory Visit: Payer: Self-pay | Admitting: Hematology and Oncology

## 2015-09-12 ENCOUNTER — Ambulatory Visit: Payer: Medicare Other

## 2015-09-12 ENCOUNTER — Ambulatory Visit: Payer: Medicare Other | Admitting: Radiation Oncology

## 2015-09-12 ENCOUNTER — Telehealth: Payer: Self-pay | Admitting: *Deleted

## 2015-09-12 ENCOUNTER — Other Ambulatory Visit: Payer: Self-pay

## 2015-09-12 DIAGNOSIS — G4730 Sleep apnea, unspecified: Secondary | ICD-10-CM | POA: Diagnosis present

## 2015-09-12 DIAGNOSIS — E559 Vitamin D deficiency, unspecified: Secondary | ICD-10-CM | POA: Diagnosis present

## 2015-09-12 DIAGNOSIS — C9002 Multiple myeloma in relapse: Secondary | ICD-10-CM

## 2015-09-12 DIAGNOSIS — K279 Peptic ulcer, site unspecified, unspecified as acute or chronic, without hemorrhage or perforation: Secondary | ICD-10-CM | POA: Diagnosis present

## 2015-09-12 DIAGNOSIS — Z806 Family history of leukemia: Secondary | ICD-10-CM | POA: Diagnosis not present

## 2015-09-12 DIAGNOSIS — Z801 Family history of malignant neoplasm of trachea, bronchus and lung: Secondary | ICD-10-CM | POA: Diagnosis not present

## 2015-09-12 DIAGNOSIS — J449 Chronic obstructive pulmonary disease, unspecified: Secondary | ICD-10-CM | POA: Diagnosis present

## 2015-09-12 DIAGNOSIS — J441 Chronic obstructive pulmonary disease with (acute) exacerbation: Principal | ICD-10-CM

## 2015-09-12 DIAGNOSIS — Z79899 Other long term (current) drug therapy: Secondary | ICD-10-CM | POA: Diagnosis not present

## 2015-09-12 DIAGNOSIS — B182 Chronic viral hepatitis C: Secondary | ICD-10-CM

## 2015-09-12 DIAGNOSIS — Z79891 Long term (current) use of opiate analgesic: Secondary | ICD-10-CM | POA: Diagnosis not present

## 2015-09-12 DIAGNOSIS — M84551K Pathological fracture in neoplastic disease, right femur, subsequent encounter for fracture with nonunion: Secondary | ICD-10-CM

## 2015-09-12 DIAGNOSIS — K219 Gastro-esophageal reflux disease without esophagitis: Secondary | ICD-10-CM | POA: Diagnosis present

## 2015-09-12 DIAGNOSIS — C9000 Multiple myeloma not having achieved remission: Secondary | ICD-10-CM | POA: Diagnosis present

## 2015-09-12 DIAGNOSIS — Z7982 Long term (current) use of aspirin: Secondary | ICD-10-CM | POA: Diagnosis not present

## 2015-09-12 DIAGNOSIS — I10 Essential (primary) hypertension: Secondary | ICD-10-CM

## 2015-09-12 DIAGNOSIS — Z9481 Bone marrow transplant status: Secondary | ICD-10-CM

## 2015-09-12 DIAGNOSIS — F419 Anxiety disorder, unspecified: Secondary | ICD-10-CM | POA: Diagnosis present

## 2015-09-12 DIAGNOSIS — F17200 Nicotine dependence, unspecified, uncomplicated: Secondary | ICD-10-CM | POA: Diagnosis present

## 2015-09-12 DIAGNOSIS — Z888 Allergy status to other drugs, medicaments and biological substances status: Secondary | ICD-10-CM | POA: Diagnosis not present

## 2015-09-12 DIAGNOSIS — Z7952 Long term (current) use of systemic steroids: Secondary | ICD-10-CM | POA: Diagnosis not present

## 2015-09-12 DIAGNOSIS — F329 Major depressive disorder, single episode, unspecified: Secondary | ICD-10-CM | POA: Diagnosis present

## 2015-09-12 DIAGNOSIS — Z7901 Long term (current) use of anticoagulants: Secondary | ICD-10-CM | POA: Diagnosis not present

## 2015-09-12 DIAGNOSIS — Z886 Allergy status to analgesic agent status: Secondary | ICD-10-CM | POA: Diagnosis not present

## 2015-09-12 DIAGNOSIS — Z885 Allergy status to narcotic agent status: Secondary | ICD-10-CM | POA: Diagnosis not present

## 2015-09-12 DIAGNOSIS — E785 Hyperlipidemia, unspecified: Secondary | ICD-10-CM | POA: Diagnosis present

## 2015-09-12 DIAGNOSIS — M1990 Unspecified osteoarthritis, unspecified site: Secondary | ICD-10-CM | POA: Diagnosis present

## 2015-09-12 DIAGNOSIS — Z86010 Personal history of colonic polyps: Secondary | ICD-10-CM | POA: Diagnosis not present

## 2015-09-12 DIAGNOSIS — K589 Irritable bowel syndrome without diarrhea: Secondary | ICD-10-CM | POA: Diagnosis present

## 2015-09-12 LAB — CBC
HCT: 35.5 % — ABNORMAL LOW (ref 36.0–46.0)
Hemoglobin: 11.2 g/dL — ABNORMAL LOW (ref 12.0–15.0)
MCH: 31.3 pg (ref 26.0–34.0)
MCHC: 31.5 g/dL (ref 30.0–36.0)
MCV: 99.2 fL (ref 78.0–100.0)
PLATELETS: 133 10*3/uL — AB (ref 150–400)
RBC: 3.58 MIL/uL — ABNORMAL LOW (ref 3.87–5.11)
RDW: 18.1 % — AB (ref 11.5–15.5)
WBC: 5.8 10*3/uL (ref 4.0–10.5)

## 2015-09-12 LAB — TROPONIN I

## 2015-09-12 LAB — CREATININE, SERUM: Creatinine, Ser: 0.72 mg/dL (ref 0.44–1.00)

## 2015-09-12 MED ORDER — ENOXAPARIN SODIUM 40 MG/0.4ML ~~LOC~~ SOLN
40.0000 mg | SUBCUTANEOUS | Status: DC
  Administered 2015-09-12 – 2015-09-17 (×6): 40 mg via SUBCUTANEOUS
  Filled 2015-09-12 (×6): qty 0.4

## 2015-09-12 MED ORDER — ALBUTEROL SULFATE (2.5 MG/3ML) 0.083% IN NEBU
2.5000 mg | INHALATION_SOLUTION | RESPIRATORY_TRACT | Status: DC | PRN
  Administered 2015-09-12 – 2015-09-14 (×5): 2.5 mg via RESPIRATORY_TRACT
  Filled 2015-09-12 (×5): qty 3

## 2015-09-12 MED ORDER — ALBUTEROL SULFATE (2.5 MG/3ML) 0.083% IN NEBU
2.5000 mg | INHALATION_SOLUTION | RESPIRATORY_TRACT | Status: DC
  Administered 2015-09-12: 2.5 mg via RESPIRATORY_TRACT
  Filled 2015-09-12: qty 3

## 2015-09-12 MED ORDER — ASPIRIN EC 81 MG PO TBEC
81.0000 mg | DELAYED_RELEASE_TABLET | Freq: Every day | ORAL | Status: DC
  Administered 2015-09-12 – 2015-09-17 (×6): 81 mg via ORAL
  Filled 2015-09-12 (×7): qty 1

## 2015-09-12 MED ORDER — AZITHROMYCIN 250 MG PO TABS
500.0000 mg | ORAL_TABLET | Freq: Every day | ORAL | Status: AC
  Administered 2015-09-12: 500 mg via ORAL
  Filled 2015-09-12: qty 2

## 2015-09-12 MED ORDER — AZITHROMYCIN 250 MG PO TABS
250.0000 mg | ORAL_TABLET | Freq: Every day | ORAL | Status: AC
  Administered 2015-09-13 – 2015-09-16 (×4): 250 mg via ORAL
  Filled 2015-09-12 (×4): qty 1

## 2015-09-12 MED ORDER — IPRATROPIUM-ALBUTEROL 0.5-2.5 (3) MG/3ML IN SOLN: 3.0000 mL | RESPIRATORY_TRACT | Status: DC

## 2015-09-12 MED ORDER — CYCLOBENZAPRINE HCL 10 MG PO TABS: 10.0000 mg | ORAL_TABLET | Freq: Three times a day (TID) | ORAL | Status: DC | PRN

## 2015-09-12 MED ORDER — MORPHINE SULFATE ER 30 MG PO TBCR
75.0000 mg | EXTENDED_RELEASE_TABLET | Freq: Three times a day (TID) | ORAL | Status: DC
  Administered 2015-09-12 – 2015-09-17 (×17): 75 mg via ORAL
  Filled 2015-09-12: qty 2
  Filled 2015-09-12: qty 1
  Filled 2015-09-12 (×2): qty 2
  Filled 2015-09-12: qty 1
  Filled 2015-09-12: qty 2
  Filled 2015-09-12 (×3): qty 1
  Filled 2015-09-12: qty 2
  Filled 2015-09-12 (×4): qty 1
  Filled 2015-09-12: qty 2
  Filled 2015-09-12 (×3): qty 1
  Filled 2015-09-12 (×4): qty 2
  Filled 2015-09-12: qty 1
  Filled 2015-09-12 (×2): qty 2
  Filled 2015-09-12: qty 1
  Filled 2015-09-12: qty 2
  Filled 2015-09-12: qty 1
  Filled 2015-09-12 (×3): qty 2
  Filled 2015-09-12 (×2): qty 1
  Filled 2015-09-12: qty 2

## 2015-09-12 MED ORDER — MORPHINE SULFATE (PF) 10 MG/ML IV SOLN
INTRAVENOUS | Status: AC
  Filled 2015-09-12: qty 1

## 2015-09-12 MED ORDER — MORPHINE SULFATE 15 MG PO TABS
30.0000 mg | ORAL_TABLET | ORAL | Status: DC | PRN
  Administered 2015-09-12 – 2015-09-14 (×4): 30 mg via ORAL
  Administered 2015-09-15: 45 mg via ORAL
  Administered 2015-09-16: 30 mg via ORAL
  Filled 2015-09-12: qty 3
  Filled 2015-09-12 (×5): qty 2

## 2015-09-12 MED ORDER — SENNOSIDES-DOCUSATE SODIUM 8.6-50 MG PO TABS
1.0000 | ORAL_TABLET | Freq: Every day | ORAL | Status: DC
  Administered 2015-09-12 – 2015-09-17 (×6): 1 via ORAL
  Filled 2015-09-12 (×6): qty 1

## 2015-09-12 MED ORDER — MORPHINE SULFATE (PF) 4 MG/ML IV SOLN
8.0000 mg | Freq: Once | INTRAVENOUS | Status: AC
  Administered 2015-09-12: 8 mg via INTRAVENOUS

## 2015-09-12 MED ORDER — PREDNISONE 20 MG PO TABS
40.0000 mg | ORAL_TABLET | Freq: Every day | ORAL | Status: DC
  Administered 2015-09-12 – 2015-09-13 (×2): 40 mg via ORAL
  Filled 2015-09-12 (×2): qty 2

## 2015-09-12 MED ORDER — LEVOTHYROXINE SODIUM 50 MCG PO TABS
50.0000 ug | ORAL_TABLET | Freq: Every day | ORAL | Status: DC
  Administered 2015-09-12 – 2015-09-17 (×6): 50 ug via ORAL
  Filled 2015-09-12 (×6): qty 1

## 2015-09-12 MED ORDER — ALBUTEROL SULFATE (2.5 MG/3ML) 0.083% IN NEBU
2.5000 mg | INHALATION_SOLUTION | Freq: Four times a day (QID) | RESPIRATORY_TRACT | Status: DC
  Administered 2015-09-12 – 2015-09-14 (×9): 2.5 mg via RESPIRATORY_TRACT
  Filled 2015-09-12 (×10): qty 3

## 2015-09-12 MED ORDER — DEXTROSE 5 % IV SOLN
1.0000 g | INTRAVENOUS | Status: DC
  Administered 2015-09-12 – 2015-09-17 (×6): 1 g via INTRAVENOUS
  Filled 2015-09-12 (×6): qty 10

## 2015-09-12 MED ORDER — BISACODYL 10 MG RE SUPP
10.0000 mg | Freq: Every day | RECTAL | Status: DC | PRN
  Filled 2015-09-12: qty 1

## 2015-09-12 MED ORDER — SODIUM CHLORIDE 0.9 % IJ SOLN
3.0000 mL | Freq: Two times a day (BID) | INTRAMUSCULAR | Status: DC
  Administered 2015-09-12 – 2015-09-17 (×2): 3 mL via INTRAVENOUS

## 2015-09-12 MED ORDER — SODIUM CHLORIDE 0.9 % IJ SOLN
10.0000 mL | INTRAMUSCULAR | Status: DC | PRN
  Administered 2015-09-12 – 2015-09-17 (×2): 10 mL
  Filled 2015-09-12 (×2): qty 40

## 2015-09-12 MED ORDER — SUCRALFATE 1 G PO TABS
1.0000 g | ORAL_TABLET | Freq: Four times a day (QID) | ORAL | Status: DC | PRN
  Filled 2015-09-12: qty 1

## 2015-09-12 NOTE — Progress Notes (Signed)
ANTIBIOTIC CONSULT NOTE - INITIAL  Pharmacy Consult for Ceftriaxone Indication: CAP  Allergies  Allergen Reactions  . Codeine Itching  . Ibuprofen Other (See Comments)    REACTION: gi trouble  . Albuterol Hives    Pt tolerated albuterol neb without issue 12/2014    Patient Measurements: Height: 5\' 4"  (162.6 cm) Weight: 149 lb 4 oz (67.7 kg) IBW/kg (Calculated) : 54.7   Vital Signs: Temp: 98 F (36.7 C) (11/10 0734) Temp Source: Oral (11/10 0734) BP: 151/83 mmHg (11/10 0734) Pulse Rate: 84 (11/10 0734) Intake/Output from previous day: 11/09 0701 - 11/10 0700 In: 10 [I.V.:10] Out: -   Labs:  Recent Labs  09/11/15 2255 09/12/15 0620  WBC 6.3 5.8  HGB 12.1 11.2*  PLT 156 133*  CREATININE 0.69 0.72   Estimated Creatinine Clearance: 66.3 mL/min (by C-G formula based on Cr of 0.72). No results for input(s): VANCOTROUGH, VANCOPEAK, VANCORANDOM, GENTTROUGH, GENTPEAK, GENTRANDOM, TOBRATROUGH, TOBRAPEAK, TOBRARND, AMIKACINPEAK, AMIKACINTROU, AMIKACIN in the last 72 hours.   Microbiology: Recent Results (from the past 720 hour(s))  Surgical pcr screen     Status: None   Collection Time: 08/22/15 12:08 PM  Result Value Ref Range Status   MRSA, PCR NEGATIVE NEGATIVE Final   Staphylococcus aureus NEGATIVE NEGATIVE Final    Comment:        The Xpert SA Assay (FDA approved for NASAL specimens in patients over 38 years of age), is one component of a comprehensive surveillance program.  Test performance has been validated by [facility] for patients greater than or equal to 69 year old. It is not intended to diagnose infection nor to guide or monitor treatment.     Medical History: Past Medical History  Diagnosis Date  . Depression   . Hypertension   . Hepatitis C     genotype 1  . Diverticulosis of colon   . Multiple myeloma     kappa light chain, s/p high-dose chemo/stem cell tx - [facility]  . Hx of adenomatous colonic polyps 2007  . Anxiety   . Osteoarthritis    . Sleep apnea   . Hyperlipidemia   . GERD with stricture   . External hemorrhoids   . Hepatitis B virus infection   . IBS (irritable bowel syndrome)   . Helicobacter pylori gastritis 2001    ? if treated  . Sciatic nerve pain     with Dr. Earle Gell  . Chronic low back pain   . Unspecified vitamin D deficiency 09/22/2013  . Allergy   . Fever 11/28/2013  . Dehydration 11/28/2013  . Nausea alone 01/22/2014  . Cough 08/02/2014  . DIVERTICULOSIS, COLON 08/31/2006  . DEPRESSION 08/31/2006    Qualifier: Diagnosis of  By: [name], [name]    . Muscle cramp 11/21/2014  . Chronic fatigue 03/07/2015  . Vitamin D deficiency 05/28/2015  . S/P radiation therapy 06/05/15-06/18/15    C7-T1     Medications:  Anti-infectives    Start     Dose/Rate Route Frequency Ordered Stop   09/13/15 1000  azithromycin (ZITHROMAX) tablet 250 mg     250 mg Oral Daily 09/12/15 0322 09/17/15 0959   09/12/15 1000  cefTRIAXone (ROCEPHIN) 1 g in dextrose 5 % 50 mL IVPB     1 g 100 mL/hr over 30 Minutes Intravenous Every 24 hours 09/12/15 0907     09/12/15 0600  azithromycin (ZITHROMAX) tablet 500 mg     500 mg Oral Daily 09/12/15 0322 09/12/15 0602  Assessment: 48 yoF presented to ED on 11/9 with SOB, productive cough.  PMH is significant for COPD, multiple myeloma on chemotherapy, and current smoker.  She was started on Azithromycin per MD and pharmacy is now consulted to begin ceftriaxone dosing for CAP.  11/10 >> Azithromycin >> 11/10 >> Ceftriaxone >>    Today, 09/12/2015: Tmax 98.2 WBC remains WNL, 5.8 SCr 0.72 with CrCl ~ 66 ml/min  Goal of Therapy:  Appropriate abx dosing, eradication of infection.   Plan:   Ceftriaxone 1g IV q24h  Dosage remains stable and need for further dosage adjustment appears unlikely at present.    Pharmacy will sign off at this time.  Please reconsult if a change in clinical status warrants re-evaluation of dosage.   Gretta Arab PharmD, BCPS Pager  [phone] 09/12/2015 10:17 AM

## 2015-09-12 NOTE — Progress Notes (Signed)
Patient seen and evaluated earlier this AM by my associate. Please refer to H and P for details regarding assessment and plan.  PE Gen: pt in nad, alert and awake CV: s1 and s2 wnl, no rubs Pulm: rhales at bases, equal chest rise, [place] in place Abd: ND, no guarding  Will add Rocephin to antibiotic regimen for treatment of CAP.  Will reassess next am.  [name]

## 2015-09-12 NOTE — Telephone Encounter (Signed)
called the floor spoke with patient RN,Kristin, patient in room 1422 ,okay for patient to have radiation, per RN, informed her patient time changed to 330 pm today if patient needs anything for pain, please give by 3 pm, thanked RN, called Faith, RT Therapist okay to treat patient 1:30 PM

## 2015-09-12 NOTE — ED Notes (Signed)
Patient is becoming increasingly sleepy with obvious respiratory distress. Charge RN [place] notified and patient will be moved to Res A.  Respiratory and Dr. Leonides Schanz at bedside.

## 2015-09-12 NOTE — ED Notes (Signed)
Assumed care of this patient.

## 2015-09-12 NOTE — H&P (Signed)
History and Physical  Patient Name: Kendra Johns     EGB:[id number]    DOB: [birth year]    DOA: 09/11/2015 Referring physician: Pryor Curia, MD PCP: [name], MD      Chief Complaint: Dyspnea and cough  HPI: Kendra Johns is a 64 y.o. female with a past medical history significant for multiple myeloma s/p BMT in 2009 and 2013 with third recurrence and bone involvement, hepatitis C not requiring treatment, HTN, smoking, and recent pathologic fractures of the right hip and failed nailing who presents with dyspnea and cough.  The patient had a pathologic hip fracture 3 weeks ago and underwent "intramuscular nailing" on 10/20.  She was then readmitted one week ago with further displacement of the fracture, seen again by Ortho and treated nonoperatively.    Since then the patient has been recuperating at home with an increase in her pain medicine and ongoing radiation therapy. She has been well enough even to go over to [facility] today for her appointment with Dr. Alvie Heidelberg, her BMT physician.    In this context she notes onset of worsening cough, chest congestion, and increased work of breathing over the last 24 hours. Tonight this was severe enough that she called 9-1-1.  In the ED, the patient was afebrile and without leukocytosis.  She was very wheezy and had progressively more trouble to breathe until she needed BiPAP. She then received continuous nebs, Solu-Medrol, and started to improve. TRH were asked to admit for COPD exacerbation.     Review of Systems:  Pt complains of cough, chest congestion, dyspnea, chest tightness. Pt denies any fever, chills, production of sputum, hemoptysis, symptoms of cough preceding Monday.  All other systems negative except as just noted or noted in the history of present illness.  Allergies  Allergen Reactions  . Codeine Itching  . Ibuprofen Other (See Comments)    REACTION: gi trouble  . Albuterol Hives    Pt tolerated albuterol neb without  issue 12/2014    Prior to Admission medications   Medication Sig Start Date End Date Taking? Authorizing Provider  aspirin 81 MG tablet Take 81 mg by mouth daily.    Historical Provider, MD  bisacodyl (DULCOLAX) 10 MG suppository Place 10 mg rectally. If not relieved MOM give suppository rectally one dose in 24 hours as needed for constipation    Historical Provider, MD  Cholecalciferol (VITAMIN D3) 5000 UNITS CAPS Take 5,000 Units by mouth daily.     Historical Provider, MD  cyclobenzaprine (FLEXERIL) 10 MG tablet Take 1 tablet (10 mg total) by mouth 3 (three) times daily as needed for muscle spasms. 08/26/15   Geradine Girt, DO  dexamethasone (DECADRON) 4 MG tablet TAKE 1 TABLET (4 MG TOTAL) BY MOUTH DAILY. 08/14/15   Heath Lark, MD  enoxaparin (LOVENOX) 40 MG/0.4ML injection Inject 0.4 mLs (40 mg total) into the skin daily. 08/22/15   Brittney Claiborne Billings, PA-C  ipratropium (ATROVENT HFA) 17 MCG/ACT inhaler Inhale 2 puffs into the lungs every 4 (four) hours as needed for wheezing. 09/13/14   Heath Lark, MD  levothyroxine (SYNTHROID) 50 MCG tablet Take 1 tablet (50 mcg total) by mouth daily before breakfast. 04/17/15   Heath Lark, MD  lidocaine-prilocaine (EMLA) cream Apply 1 application topically as needed. Patient taking differently: Apply 1 application topically daily as needed (for port).  05/28/15   Heath Lark, MD  magic mouthwash SOLN Take 10 mLs by mouth 4 (four) times daily as needed for mouth pain. Swish  and spit  Or  Swish and swallow. 07/10/15   Heath Lark, MD  magnesium hydroxide (MILK OF MAGNESIA) 400 MG/5ML suspension Take 30 mLs by mouth daily as needed for mild constipation (constipation).     Historical Provider, MD  metoprolol (LOPRESSOR) 100 MG tablet Take 1 tablet (100 mg total) by mouth 2 (two) times daily. 08/26/15   Geradine Girt, DO  morphine (MS CONTIN) 15 MG 12 hr tablet Take 5 tablets (75 mg total) by mouth every 8 (eight) hours. 08/26/15   Geradine Girt, DO  morphine (MSIR)  30 MG tablet Take 1-1.5 tablets (30-45 mg total) by mouth every 2 (two) hours as needed for moderate pain or severe pain. Patient taking differently: Take 30-45 mg by mouth every 2 (two) hours as needed for moderate pain or severe pain. Take one tablet every 2 hours as needed for moderate pain 08/26/15   Tomi Bamberger Vann, DO  senna-docusate (SENOKOT-S) 8.6-50 MG tablet Take 1 tablet by mouth daily. 08/26/15   Geradine Girt, DO  Sodium Phosphates (RA SALINE ENEMA RE) Place rectally. If not relieved by suppository give enema rectally one dose in 24 hours as needed for constipation    Historical Provider, MD  sucralfate (CARAFATE) 1 G tablet Take 1 tablet (1 g total) by mouth 4 (four) times daily. Patient taking differently: Take 1 g by mouth 4 (four) times daily as needed (stomach ulcers/ pain).  06/17/15   Kyung Rudd, MD    Past Medical History  Diagnosis Date  . Depression   . Hypertension   . Hepatitis C     genotype 1  . Diverticulosis of colon   . Multiple myeloma     kappa light chain, s/p high-dose chemo/stem cell tx - [facility]  . Hx of adenomatous colonic polyps 2007  . Anxiety   . Osteoarthritis   . Sleep apnea   . Hyperlipidemia   . GERD with stricture   . External hemorrhoids   . Hepatitis B virus infection   . IBS (irritable bowel syndrome)   . Helicobacter pylori gastritis 2001    ? if treated  . Sciatic nerve pain     with Dr. Earle Gell  . Chronic low back pain   . Unspecified vitamin D deficiency 09/22/2013  . Allergy   . Fever 11/28/2013  . Dehydration 11/28/2013  . Nausea alone 01/22/2014  . Cough 08/02/2014  . DIVERTICULOSIS, COLON 08/31/2006  . DEPRESSION 08/31/2006    Qualifier: Diagnosis of  By: [name], [name]    . Muscle cramp 11/21/2014  . Chronic fatigue 03/07/2015  . Vitamin D deficiency 05/28/2015  . S/P radiation therapy 06/05/15-06/18/15    C7-T1   The patient reports getting a "cold in my chest" at least once a year similar to this. It usually gets better  with bronchodilators she reports. She was seen by pulmonology 1 year ago who thought it appears that her exam was more consistent with large airway disease rather than smaller airway obstruction. However she has not had PFTs. She does not use daily maintenance inhaler and does not have a rescue inhaler at home.    Past Surgical History  Procedure Laterality Date  . Abdominal hysterectomy    . Oophorectomy    . Dilation and curettage of uterus    . Bone marrow transplant      2009, 2013 [facility]  . Appendectomy    . Colonoscopy w/ polypectomy  08/2006    1-2 adenomas, 6  polyps total, diverticulosis and external hemorrhoids  . Upper gastrointestinal endoscopy  08/2003    esophageal stricture dilation, hiatal hernia, gastrritis  . Cholecystectomy    . Tonsillectomy and adenoidectomy    . Femur im nail Right 08/22/2015    Procedure: INTRAMEDULLARY (IM) NAIL FEMORAL;  Surgeon: [name], MD;  Location: [facility];  Service: Orthopedics;  Laterality: Right;    Family history: family history includes Alcohol abuse in an other family member; Cirrhosis in her sister; Leukemia in her mother; Lung cancer in her father and sister. There is no history of Colon cancer.  Social History: Patient lives with her husband and grandson.  She is an active smoker.  She is retired.  She is from [place], [place].       Physical Exam: BP 130/92 mmHg  Pulse 106  Temp(Src) 98 F (36.7 C) (Oral)  Resp 18  SpO2 98% General appearance: Overweight adult female, alert and in moderate distress from dyspnea, tired.  Pain with movement.   Eyes: Anicteric, conjunctiva pink, lids and lashes normal.  Tearing. ENT: No nasal deformity, discharge, or epistaxis.  Denition poor.  Tongue tacky, but otherwise, OP moist without lesions.   Skin: Warm and dry.   Cardiac: Tachycardic, nl S1-S2, no gallops.  No JVD.  No LE edema. Respiratory: Off BiPAP, the patient appears tired but has no increased work of breathing and saturates  well on ambient air. She has musical high-pitched expiratory wheezes diffusely. She has some coarse airway sounds on both sides as well. Abdomen: Abdomen soft without rigidity.  No TTP. No ascites, distension.   Neuro: Sensorium intact and responding to questions, attention normal.  Speech is fluent.  Does not move the right leg due to pain.    Psych: Behavior appropriate.  Affect blunted.  No evidence of aural or visual hallucinations or delusions.       Labs on Admission:  The metabolic panel shows normal sodium, potassium, bicarbonate, and renal function. Troponin is negative. The complete blood count shows no leukocytosis, anemia, thrombocytopenia.   Radiological Exams on Admission: Personally reviewed: Dg Chest Portable 1 View  09/11/2015  CLINICAL DATA:  Shortness of breath and wheezing EXAM: PORTABLE CHEST 1 VIEW COMPARISON:  12/28/2014 FINDINGS: Normal heart size and stable aortic tortuosity. Right IJ porta catheter, tip at the lower SVC. Lower lung volumes with interstitial crowding. There is no edema, consolidation, effusion, or pneumothorax. Remote right-sided rib fractures. No acute osseous finding. IMPRESSION: No acute finding. Electronically Signed   By: Monte Fantasia M.D.   On: 09/11/2015 23:53    EKG: Independently reviewed. NSR with no ST-T changes    Assessment/Plan 1. COPD exacerbation:  The patient has small airway obstruction on exam as well as improvement with bronchodilators. She has no documentation of obstructive disease by PFT or pulmonary consult but her history seems consistent.  Given her long-term dexamethasone use and multiple myeloma, certainly a question of a fungal lung infection causing bronchospasm is reasonable to entertain. -Prednisone 40 mg by mouth for 5 days -Advised albuterol every 4 hours and every 2 hours when necessary -Azithromycin 500 mg by mouth followed by 250 mg daily for 4 days -Will obtain Gram stain and culture  2. Multiple myeloma  with recent early healing pathologic fractures:  -Hold dexamethasone while on prednisone then restart -Patient is due for radiation therapy tomorrow afternoon -Message has been sent to the patient's radiation oncologist Dr. Genia Harold -Continue home MS Contin 75 mg 3 times daily -Continue home  morphine IR 30-45 mg every 2 hours when necessary for breakthrough  3. GERD/PUD:  -Continue home sucralfate as needed    DVT PPx: Lovenox Diet: Regular Consultants: None Code Status: Full; this was confirmed explicitly Family Communication: Patient's granddaughter was present at the bedside.  Medical decision making: What exists of the patient's previous chart and her records from [facility] from [place] were reviewed in depth and the case was discussed with Dr. Leonides Schanz. Patient seen 2:35 AM on 09/12/2015.  Disposition Plan:  Stable off Bipap now.  Admit to tele and treat COPD exacerbation with steroids, antibiotics, and bronchodilators.  Patient is just off Bipap now, but if markedly improved, could be discharged by tomorrow, if not, would anticipate admission through the weekend.        Edwin Dada [facility] Pager [phone]

## 2015-09-12 NOTE — Evaluation (Signed)
Physical Therapy Evaluation Patient Details Name: Kendra Johns MRN: [id number] DOB: [birth year] Today's Date: 09/12/2015   History of Present Illness   64 yo female admitted with DOE and cough d/t COPD exacerbation; PMH of depression, HTN, hep B and C, OA, multiple myeloma s/p bone marrow/stem cell transplant in 2009 with disease relapse in 2012. Second bone marrow transplant in 2013. Pt was recently admitted for a pathological fracture to her R femur.   Clinical Impression  Pt admitted with above diagnosis. Pt currently with functional limitations due to the deficits listed below (see PT Problem List). Pt will benefit from skilled PT to increase their independence and safety with mobility to allow discharge to the venue listed below. Pt maintained oxygen level >98% on RA during ambulation and functional activities, however, was SOB and rated her dyspnea as mild. Recommend HHPT at d/c to continue rehab at home.        Follow Up Recommendations Home health PT;Supervision for mobility/OOB    Equipment Recommendations  None recommended by PT    Recommendations for Other Services       Precautions / Restrictions Precautions Precautions: Fall Restrictions Weight Bearing Restrictions: No RLE Weight Bearing: Weight bearing as tolerated      Mobility  Bed Mobility Overal bed mobility: Needs Assistance Bed Mobility: Supine to Sit     Supine to sit: Supervision;HOB elevated     General bed mobility comments: Pt required increased time and mod VC's for posture during sitting. Pt self-assist with RLE off the bed  Transfers Overall transfer level: Needs assistance Equipment used: Rolling walker (2 wheeled) Transfers: Sit to/from Stand Sit to Stand: Min guard         General transfer comment: Min guard for safety; min VC's for hand placement. Pt requires increased time to rise, increased SOB, Oxygen level maintained >98% on RA.  Ambulation/Gait Ambulation/Gait assistance: Min  guard Ambulation Distance (Feet): 25 Feet (amb to the bathroom and 15' in the hall) Assistive device: Rolling walker (2 wheeled) Gait Pattern/deviations: Step-through pattern;Decreased stride length;Antalgic;Decreased dorsiflexion - right;Trunk flexed     General Gait Details: Mod cues for decreasing toe out ER on RLE, good posture, and breathing. Min guard assist for safety. Pt became SOB and strated coughing after 15 feet amb in the hall. Oxygen level maintained >98% on RA. Called respiratory therapy for breathing treatment.    Stairs            Wheelchair Mobility    Modified Rankin (Stroke Patients Only)       Balance                                             Pertinent Vitals/Pain Pain Assessment: No/denies pain    Home Living Family/patient expects to be discharged to:: Private residence Living Arrangements: Spouse/significant other;Other relatives (husband and 65 yo grandson) Available Help at Discharge: Family;Available 24 hours/day Type of Home: House Home Access: Stairs to enter Entrance Stairs-Rails: Can reach both Entrance Stairs-Number of Steps: 3 Home Layout: One level Home Equipment: Walker - 4 wheels;Shower seat;Hand held Tourist information centre manager - 2 wheels      Prior Function Level of Independence: Independent with assistive device(s)         Comments: Amb at home with RW, I with ADLs     Hand Dominance        Extremity/Trunk Assessment  Upper Extremity Assessment: Overall WFL for tasks assessed           Lower Extremity Assessment: RLE deficits/detail RLE Deficits / Details: observed functional weakness d/t decreased WB, guarding, increased toe out/ER    Cervical / Trunk Assessment: Normal  Communication   Communication: No difficulties  Cognition Arousal/Alertness: Awake/alert Behavior During Therapy: WFL for tasks assessed/performed Overall Cognitive Status: Within Functional Limits for tasks assessed                       General Comments      Exercises        Assessment/Plan    PT Assessment Patient needs continued PT services  PT Diagnosis Difficulty walking   PT Problem List Cardiopulmonary status limiting activity;Decreased mobility;Decreased activity tolerance;Decreased knowledge of precautions;Decreased strength  PT Treatment Interventions Gait training;Patient/family education;DME instruction;Stair training;Functional mobility training;Therapeutic activities;Therapeutic exercise   PT Goals (Current goals can be found in the Care Plan section) Acute Rehab PT Goals Patient Stated Goal: to go home PT Goal Formulation: With patient/family Time For Goal Achievement: 09/26/15 Potential to Achieve Goals: Good    Frequency Min 3X/week   Barriers to discharge        Co-evaluation               End of Session Equipment Utilized During Treatment: Gait belt Activity Tolerance: Treatment limited secondary to medical complications (Comment) (DOE, SOB, cough) Patient left: in chair;with call bell/phone within reach;with family/visitor present Nurse Communication: Mobility status;Precautions         Time: 7943-2761 PT Time Calculation (min) (ACUTE ONLY): 27 min   Charges:   PT Evaluation $Initial PT Evaluation Tier I: 1 Procedure PT Treatments $Gait Training: 8-22 mins   PT G Codes:        Mischell Branford, SPT 27-Sep-2015, 12:49 PM

## 2015-09-12 NOTE — Progress Notes (Signed)
BIPAP removed from Pt per MD at bedside.  Pt on room air at this time and tolerating well.  Pt is in no noted distress at this time, RT to monitor and assess as needed.

## 2015-09-13 ENCOUNTER — Ambulatory Visit: Payer: Self-pay

## 2015-09-13 ENCOUNTER — Encounter: Payer: Self-pay | Admitting: Radiation Oncology

## 2015-09-13 ENCOUNTER — Telehealth: Payer: Self-pay | Admitting: Family Medicine

## 2015-09-13 ENCOUNTER — Ambulatory Visit: Payer: Medicare Other | Admitting: Radiation Oncology

## 2015-09-13 ENCOUNTER — Ambulatory Visit
Admission: RE | Admit: 2015-09-13 | Discharge: 2015-09-13 | Disposition: A | Discharge status: Home / Self Care | Payer: Medicare Other | Source: Ambulatory Visit | Attending: Radiation Oncology | Admitting: Radiation Oncology

## 2015-09-13 ENCOUNTER — Telehealth: Payer: Self-pay

## 2015-09-13 VITALS — BP 153/92 | HR 82 | Temp 98.1°F

## 2015-09-13 DIAGNOSIS — C9002 Multiple myeloma in relapse: Secondary | ICD-10-CM

## 2015-09-13 LAB — BLOOD GAS, ARTERIAL
Acid-base deficit: 2.3 mmol/L — ABNORMAL HIGH (ref 0.0–2.0)
Bicarbonate: 21.2 meq/L (ref 20.0–24.0)
Drawn by: 345601
O2 Content: 2 L/min
O2 Saturation: 96.2 %
Patient temperature: 98.6
TCO2: 19.3 mmol/L (ref 0–100)
pCO2 arterial: 33.9 mmHg — ABNORMAL LOW (ref 35.0–45.0)
pH, Arterial: 7.413 (ref 7.350–7.450)
pO2, Arterial: 83.8 mmHg (ref 80.0–100.0)

## 2015-09-13 LAB — EXPECTORATED SPUTUM ASSESSMENT W REFEX TO RESP CULTURE

## 2015-09-13 LAB — EXPECTORATED SPUTUM ASSESSMENT W GRAM STAIN, RFLX TO RESP C

## 2015-09-13 MED ORDER — NICOTINE 14 MG/24HR TD PT24
14.0000 mg | MEDICATED_PATCH | Freq: Every day | TRANSDERMAL | Status: DC
  Administered 2015-09-13 – 2015-09-17 (×5): 14 mg via TRANSDERMAL
  Filled 2015-09-13 (×5): qty 1

## 2015-09-13 MED ORDER — ALBUTEROL SULFATE (2.5 MG/3ML) 0.083% IN NEBU
2.5000 mg | INHALATION_SOLUTION | Freq: Once | RESPIRATORY_TRACT | Status: AC
  Administered 2015-09-13: 2.5 mg via RESPIRATORY_TRACT
  Filled 2015-09-13: qty 3

## 2015-09-13 MED ORDER — IPRATROPIUM BROMIDE 0.02 % IN SOLN
0.5000 mg | Freq: Once | RESPIRATORY_TRACT | Status: AC
  Administered 2015-09-13: 0.5 mg via RESPIRATORY_TRACT
  Filled 2015-09-13: qty 2.5

## 2015-09-13 MED ORDER — METHYLPREDNISOLONE SODIUM SUCC 125 MG IJ SOLR
60.0000 mg | Freq: Every day | INTRAMUSCULAR | Status: DC
  Administered 2015-09-13 – 2015-09-14 (×2): 60 mg via INTRAVENOUS
  Filled 2015-09-13 (×2): qty 2

## 2015-09-13 MED ORDER — METHYLPREDNISOLONE SODIUM SUCC 125 MG IJ SOLR
125.0000 mg | INTRAMUSCULAR | Status: AC
  Administered 2015-09-13: 125 mg via INTRAVENOUS
  Filled 2015-09-13: qty 2

## 2015-09-13 MED ORDER — AMLODIPINE BESYLATE 5 MG PO TABS
5.0000 mg | ORAL_TABLET | Freq: Every day | ORAL | Status: DC
  Administered 2015-09-13 – 2015-09-15 (×3): 5 mg via ORAL
  Filled 2015-09-13 (×3): qty 1

## 2015-09-13 NOTE — Telephone Encounter (Signed)
Returned pt call re: appointment.  Pt is currently admitted.  Confirmed appt on 11/14 and provided pt appt time of 1145.  Pt voiced understanding.

## 2015-09-13 NOTE — Progress Notes (Signed)
Department of Radiation Oncology  Phone:  [phone] Fax:        [phone]  Weekly Treatment Note    Name: Kendra Johns Date: 09/13/2015 MRN: [id number] DOB: 1950/11/22   Current dose: 12.5 Gy  Current fraction: 5   MEDICATIONS: No current facility-administered medications for this encounter.   Current Outpatient Prescriptions  Medication Sig Dispense Refill  . [DISCONTINUED] amLODipine (NORVASC) 5 MG tablet Take 1 tablet (5 mg total) by mouth daily. 90 tablet 3  . [DISCONTINUED] gabapentin (NEURONTIN) 600 MG tablet Take 600 mg by mouth 3 (three) times daily.       Facility-Administered Medications Ordered in Other Encounters  Medication Dose Route Frequency Provider Last Rate Last Dose  . albuterol (PROVENTIL) (2.5 MG/3ML) 0.083% nebulizer solution 2.5 mg  2.5 mg Nebulization Q2H PRN Edwin Dada, MD   2.5 mg at 09/13/15 0600  . albuterol (PROVENTIL) (2.5 MG/3ML) 0.083% nebulizer solution 2.5 mg  2.5 mg Nebulization QID Kristen N Ward, DO   2.5 mg at 09/13/15 1553  . amLODipine (NORVASC) tablet 5 mg  5 mg Oral Daily [name], MD   5 mg at 09/13/15 1433  . aspirin EC tablet 81 mg  81 mg Oral Daily Edwin Dada, MD   81 mg at 09/13/15 0947  . azithromycin (ZITHROMAX) tablet 250 mg  250 mg Oral Daily Edwin Dada, MD   250 mg at 09/13/15 0947  . bisacodyl (DULCOLAX) suppository 10 mg  10 mg Rectal Daily PRN Edwin Dada, MD      . cefTRIAXone (ROCEPHIN) 1 g in dextrose 5 % 50 mL IVPB  1 g Intravenous Q24H Randa Spike, RPH   1 g at 09/13/15 0947  . cyclobenzaprine (FLEXERIL) tablet 10 mg  10 mg Oral TID PRN Edwin Dada, MD      . enoxaparin (LOVENOX) injection 40 mg  40 mg Subcutaneous Q24H Edwin Dada, MD   40 mg at 09/13/15 0947  . levothyroxine (SYNTHROID, LEVOTHROID) tablet 50 mcg  50 mcg Oral QAC breakfast Edwin Dada, MD   50 mcg at 09/13/15 0807  . methylPREDNISolone sodium succinate  (SOLU-MEDROL) 125 mg/2 mL injection 60 mg  60 mg Intravenous Daily [name], MD   60 mg at 09/13/15 1433  . morphine (MS CONTIN) 12 hr tablet 75 mg  75 mg Oral 3 times per day Edwin Dada, MD   75 mg at 09/13/15 1339  . morphine (MSIR) tablet 30-45 mg  30-45 mg Oral Q2H PRN Edwin Dada, MD   30 mg at 09/12/15 1150  . nicotine (NICODERM CQ - dosed in mg/24 hours) patch 14 mg  14 mg Transdermal Daily [name], MD   14 mg at 09/13/15 1433  . senna-docusate (Senokot-S) tablet 1 tablet  1 tablet Oral Daily Edwin Dada, MD   1 tablet at 09/13/15 0947  . sodium chloride 0.9 % injection 10-40 mL  10-40 mL Intracatheter PRN Edwin Dada, MD   10 mL at 09/12/15 0620  . sodium chloride 0.9 % injection 3 mL  3 mL Intravenous Q12H Edwin Dada, MD   3 mL at 09/12/15 0607  . sucralfate (CARAFATE) tablet 1 g  1 g Oral QID PRN Edwin Dada, MD         ALLERGIES: Codeine; Ibuprofen; and Albuterol   LABORATORY DATA:  Lab Results  Component Value Date   WBC 5.8 09/12/2015   HGB 11.2* 09/12/2015  HCT 35.5* 09/12/2015   MCV 99.2 09/12/2015   PLT 133* 09/12/2015   Lab Results  Component Value Date   NA 141 09/11/2015   K 4.3 09/11/2015   CL 107 09/11/2015   CO2 27 09/11/2015   Lab Results  Component Value Date   ALT 13 08/07/2015   AST 12 08/07/2015   ALKPHOS 100 08/07/2015   BILITOT 0.39 08/07/2015     NARRATIVE: Kendra Johns was seen today for weekly treatment management. The chart was checked and the patient's films were reviewed.  Kendra Johns is here for her 5th treatment of radiation to her L1 L2 and Right Femur. She denies any pain at this time. She is in the hospital for her chest congestion and is receiving antibiotics and inhalers. Currently she is feeling better unless she exerts her self too much. Per her report her skin is intact. She does admit to feeling some fatigue since she has been sick with her congestion.   BP  153/92 mmHg  Pulse 82  Temp(Src) 98.1 F (36.7 C)  SpO2 100%   Wt Readings from Last 3 Encounters:  09/12/15 149 lb 4 oz (67.7 kg)  08/29/15 146 lb (66.225 kg)  08/22/15 148 lb 5.9 oz (67.3 kg)    PHYSICAL EXAMINATION: temperature is 98.1 F (36.7 C). Her blood pressure is 153/92 and her pulse is 82. Her oxygen saturation is 100%.        ASSESSMENT: The patient is doing satisfactorily with treatment.  PLAN: We will continue with the patient's radiation treatment as planned.

## 2015-09-13 NOTE — Progress Notes (Signed)
Ambulated pt in the hallway with one assist/walker. Pt ambulated 20 ft and become short of breath with audible wheezing. Due to pt's respiratory distress pt was returned to room and immediately given a breathing tx. NP on call paged and new orders received and implemented. Once pt was returned to the bed and receiving 1st breathing tx, her o2 sats were 100 percent. Will continue to monitor.

## 2015-09-13 NOTE — Progress Notes (Signed)
Received report on patient this morning. Pt still audible wheezing at rest and worse on exertion. Pt does not feel she is ready to be discharge home today. Oxygen saturation 99% on room air at rest. Pt for radiation treatment at 11 am. No other complaints or needs at this time.

## 2015-09-13 NOTE — Progress Notes (Signed)
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  Disposition Plan: Pending improvement in condition   Consultants:  Radiation oncology  Procedures:  none  Antibiotics:  Azithromycin and rocephin  HPI/Subjective: Pt's questions answered to her satisfaction. She is requesting nicotine patch  Objective: Filed Vitals:   09/13/15 1306  BP: 183/89  Pulse: 76  Temp: 97.6 F (36.4 C)  Resp: 22    Intake/Output Summary (Last 24 hours) at 09/13/15 1345 Last data filed at 09/13/15 0830  Gross per 24 hour  Intake    360 ml  Output      0 ml  Net    360 ml   Filed Weights   09/12/15 0321  Weight: 67.7 kg (149 lb 4 oz)    Exam:   General:  Pt in nad, alert and awake  Cardiovascular: rrr, no mrg  Respiratory: exp wheezes BL, prolonged exp phase  Abdomen: soft, ND, NT  Musculoskeletal: no cyanosis or clubbing   Data Reviewed: Basic Metabolic Panel:  Recent Labs Lab 09/11/15 2255 09/12/15 0620  NA 141  --   K 4.3  --   CL 107  --   CO2 27  --   GLUCOSE 106*  --   BUN 16  --   CREATININE 0.69 0.72  CALCIUM 9.1  --    Liver Function Tests: No results for input(s): AST, ALT, ALKPHOS, BILITOT, PROT,  ALBUMIN in the last 168 hours. No results for input(s): LIPASE, AMYLASE in the last 168 hours. No results for input(s): AMMONIA in the last 168 hours. CBC:  Recent Labs Lab 09/11/15 2255 09/12/15 0620  WBC 6.3 5.8  HGB 12.1 11.2*  HCT 38.1 35.5*  MCV 98.2 99.2  PLT 156 133*   Cardiac Enzymes:  Recent Labs Lab 09/12/15 0006  TROPONINI <0.03   BNP (last 3 results) No results for input(s): BNP in the last 8760 hours.  ProBNP (last 3 results) No results for input(s): PROBNP in the last 8760 hours.  CBG: No results for input(s): GLUCAP in the last 168 hours.  Recent Results (from the past 240 hour(s))  Culture, expectorated sputum-assessment     Status: None   Collection Time: 09/13/15  6:15 AM  Result Value Ref Range Status   Specimen Description SPUTUM  Final   Special Requests Immunocompromised  Final   Sputum evaluation   Final    THIS SPECIMEN IS ACCEPTABLE. RESPIRATORY CULTURE REPORT TO FOLLOW.   Report Status 09/13/2015 FINAL  Final     Studies: Dg Chest Portable 1 View  09/11/2015  CLINICAL DATA:  Shortness of breath and wheezing EXAM: PORTABLE CHEST 1 VIEW COMPARISON:  12/28/2014 FINDINGS: Normal heart size and stable aortic tortuosity. Right IJ porta catheter, tip at the lower SVC.  Lower lung volumes with interstitial crowding. There is no edema, consolidation, effusion, or pneumothorax. Remote right-sided rib fractures. No acute osseous finding. IMPRESSION: No acute finding. Electronically Signed   By: Monte Fantasia M.D.   On: 09/11/2015 23:53    Scheduled Meds: . albuterol  2.5 mg Nebulization QID  . aspirin EC  81 mg Oral Daily  . azithromycin  250 mg Oral Daily  . cefTRIAXone (ROCEPHIN)  IV  1 g Intravenous Q24H  . enoxaparin (LOVENOX) injection  40 mg Subcutaneous Q24H  . levothyroxine  50 mcg Oral QAC breakfast  . morphine  75 mg Oral 3 times per day  . nicotine  14 mg Transdermal Daily  . predniSONE  40 mg Oral Q breakfast  . senna-docusate  1  tablet Oral Daily  . sodium chloride  3 mL Intravenous Q12H   Continuous Infusions:   Time spent: > 35 minutes    [name]  [facility] Pager [phone]. If 7PM-7AM, please contact night-coverage at [email or web address], password [facility] 09/13/2015, 1:45 PM  LOS: 1 day

## 2015-09-13 NOTE — Progress Notes (Signed)
Ms. Blydenburgh is here for her 5th treatment of radiation to her L1 L2 and Right Femur. She denies any pain at this time. She is in the hospital for her chest congestion and is receiving antibiotics and inhalers. Currently she is feeling better unless she exerts her self too much. Per her report her skin is intact. She does admit to feeling some fatigue since she has been sick with her congestion.   BP 153/92 mmHg  Pulse 82  Temp(Src) 98.1 F (36.7 C)  SpO2 100%   Wt Readings from Last 3 Encounters:  09/12/15 149 lb 4 oz (67.7 kg)  08/29/15 146 lb (66.225 kg)  08/22/15 148 lb 5.9 oz (67.3 kg)

## 2015-09-13 NOTE — Progress Notes (Signed)
Pt states that she will not need any Home Health at present time.

## 2015-09-13 NOTE — Telephone Encounter (Signed)
Kendra Johns a OT with [facility] health has not been able to reach the pt said she will try again next week

## 2015-09-14 ENCOUNTER — Inpatient Hospital Stay (HOSPITAL_COMMUNITY): Payer: Medicare Other

## 2015-09-14 MED ORDER — HYDRALAZINE HCL 20 MG/ML IJ SOLN
10.0000 mg | Freq: Once | INTRAMUSCULAR | Status: AC
  Administered 2015-09-14: 10 mg via INTRAVENOUS
  Filled 2015-09-14: qty 1

## 2015-09-14 MED ORDER — ACETAMINOPHEN 325 MG PO TABS
650.0000 mg | ORAL_TABLET | Freq: Once | ORAL | Status: AC
  Administered 2015-09-14: 650 mg via ORAL
  Filled 2015-09-14: qty 2

## 2015-09-14 MED ORDER — ALBUTEROL SULFATE (2.5 MG/3ML) 0.083% IN NEBU
2.5000 mg | INHALATION_SOLUTION | RESPIRATORY_TRACT | Status: DC
  Administered 2015-09-14 – 2015-09-15 (×3): 2.5 mg via RESPIRATORY_TRACT
  Filled 2015-09-14 (×3): qty 3

## 2015-09-14 MED ORDER — METHYLPREDNISOLONE SODIUM SUCC 125 MG IJ SOLR
60.0000 mg | Freq: Two times a day (BID) | INTRAMUSCULAR | Status: DC
  Administered 2015-09-14 – 2015-09-17 (×6): 60 mg via INTRAVENOUS
  Filled 2015-09-14 (×6): qty 2

## 2015-09-14 NOTE — Progress Notes (Signed)
Pt had another episode of extreme shortness of breath and wheezing after using the bathroom. Pt given breathing treatment along with some calming techniques with improvement. Pt advised to use bedside commode instead.

## 2015-09-14 NOTE — Progress Notes (Signed)
[facility] PROGRESS NOTE  Kendra Johns [id number] DOB: 21-Jan-1951 DOA: 09/11/2015 PCP: [name], MD  Assessment/Plan: Principal Problem:   COPD exacerbation ([place]) - placed on solumedrol. Still wheezing as such will increased solumedrol dose. 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  Disposition Plan: Pending improvement in condition   Consultants:  Radiation oncology  Procedures:  none  Antibiotics:  Azithromycin and rocephin  HPI/Subjective: Pt has no new complaints. Inquiring about discharge. She dose state that she is still wheezing and had some difficulty this morning.  Objective: Filed Vitals:   09/14/15 0928  BP:   Pulse: 106  Temp:   Resp:     Intake/Output Summary (Last 24 hours) at 09/14/15 1248 Last data filed at 09/14/15 0816  Gross per 24 hour  Intake    580 ml  Output      0 ml  Net    580 ml   Filed Weights   09/12/15 0321  Weight: 67.7 kg (149 lb 4 oz)    Exam:   General:  Pt in nad, alert and awake  Cardiovascular: rrr, no mrg  Respiratory: exp wheezes BL, prolonged exp phase, equal chest rise.  Abdomen: soft, ND, NT  Musculoskeletal: no cyanosis or clubbing   Data Reviewed: Basic Metabolic Panel:  Recent Labs Lab 09/11/15 2255 09/12/15 0620  NA 141  --   K 4.3  --   CL 107  --   CO2 27  --   GLUCOSE 106*  --   BUN 16  --   CREATININE 0.69 0.72  CALCIUM 9.1  --    Liver Function  Tests: No results for input(s): AST, ALT, ALKPHOS, BILITOT, PROT, ALBUMIN in the last 168 hours. No results for input(s): LIPASE, AMYLASE in the last 168 hours. No results for input(s): AMMONIA in the last 168 hours. CBC:  Recent Labs Lab 09/11/15 2255 09/12/15 0620  WBC 6.3 5.8  HGB 12.1 11.2*  HCT 38.1 35.5*  MCV 98.2 99.2  PLT 156 133*   Cardiac Enzymes:  Recent Labs Lab 09/12/15 0006  TROPONINI <0.03   BNP (last 3 results) No results for input(s): BNP in the last 8760 hours.  ProBNP (last 3 results) No results for input(s): PROBNP in the last 8760 hours.  CBG: No results for input(s): GLUCAP in the last 168 hours.  Recent Results (from the past 240 hour(s))  Culture, expectorated sputum-assessment     Status: None   Collection Time: 09/13/15  6:15 AM  Result Value Ref Range Status   Specimen Description SPUTUM  Final   Special Requests Immunocompromised  Final   Sputum evaluation   Final    THIS SPECIMEN IS ACCEPTABLE. RESPIRATORY CULTURE REPORT TO FOLLOW.   Report Status 09/13/2015 FINAL  Final  Culture, respiratory (NON-Expectorated)     Status: None (Preliminary result)   Collection Time: 09/13/15  6:15 AM  Result Value Ref Range Status   Specimen Description SPUTUM  Final  Special Requests NONE  Final   Gram Stain   Final    FEW WBC PRESENT, PREDOMINANTLY PMN FEW SQUAMOUS EPITHELIAL CELLS PRESENT FEW GRAM POSITIVE RODS FEW GRAM NEGATIVE RODS FEW GRAM POSITIVE COCCI IN PAIRS    Culture   Final    NORMAL OROPHARYNGEAL FLORA Performed at [facility]    Report Status PENDING  Incomplete     Studies: Dg Chest Port 1 View  09/14/2015  CLINICAL DATA:  Dyspnea EXAM: PORTABLE CHEST 1 VIEW COMPARISON:  09/11/2015 FINDINGS: Porta catheter by right IJ approach, tip at the lower SVC. Normal heart size and stable aortic tortuosity. Chronic mild hyperinflation and interstitial coarsening compatible with COPD in this smoker. There is no edema,  consolidation, effusion, or pneumothorax. Osteopenia. No acute fracture. IMPRESSION: 1. No evidence of acute cardiopulmonary disease. 2. COPD. Electronically Signed   By: Monte Fantasia M.D.   On: 09/14/2015 07:12    Scheduled Meds: . albuterol  2.5 mg Nebulization QID  . amLODipine  5 mg Oral Daily  . aspirin EC  81 mg Oral Daily  . azithromycin  250 mg Oral Daily  . cefTRIAXone (ROCEPHIN)  IV  1 g Intravenous Q24H  . enoxaparin (LOVENOX) injection  40 mg Subcutaneous Q24H  . levothyroxine  50 mcg Oral QAC breakfast  . methylPREDNISolone (SOLU-MEDROL) injection  60 mg Intravenous Daily  . morphine  75 mg Oral 3 times per day  . nicotine  14 mg Transdermal Daily  . senna-docusate  1 tablet Oral Daily  . sodium chloride  3 mL Intravenous Q12H   Continuous Infusions:   Time spent: > 35 minutes    [name]  [facility] Pager [phone]. If 7PM-7AM, please contact night-coverage at [email or web address], password [facility] 09/14/2015, 12:48 PM  LOS: 2 days

## 2015-09-15 LAB — CULTURE, RESPIRATORY W GRAM STAIN: Culture: NORMAL

## 2015-09-15 LAB — CULTURE, RESPIRATORY

## 2015-09-15 MED ORDER — AMLODIPINE BESYLATE 5 MG PO TABS
5.0000 mg | ORAL_TABLET | Freq: Once | ORAL | Status: AC
  Administered 2015-09-15: 5 mg via ORAL
  Filled 2015-09-15: qty 1

## 2015-09-15 MED ORDER — HYDRALAZINE HCL 20 MG/ML IJ SOLN
10.0000 mg | Freq: Four times a day (QID) | INTRAMUSCULAR | Status: DC | PRN
  Filled 2015-09-15: qty 1

## 2015-09-15 MED ORDER — IPRATROPIUM-ALBUTEROL 0.5-2.5 (3) MG/3ML IN SOLN
3.0000 mL | RESPIRATORY_TRACT | Status: DC
  Administered 2015-09-15 – 2015-09-17 (×13): 3 mL via RESPIRATORY_TRACT
  Filled 2015-09-15 (×13): qty 3

## 2015-09-15 MED ORDER — AMLODIPINE BESYLATE 10 MG PO TABS
10.0000 mg | ORAL_TABLET | Freq: Every day | ORAL | Status: DC
  Administered 2015-09-16 – 2015-09-17 (×2): 10 mg via ORAL
  Filled 2015-09-15 (×2): qty 1

## 2015-09-15 NOTE — Progress Notes (Signed)
OT Cancellation Note  Patient Details Name: Kendra Johns MRN: [id number] DOB: 1951-05-07   Cancelled Treatment:    Reason Eval/Treat Not Completed: Medical issues which prohibited therapy  Pts BP 182/102. RN calling MD. Will recheck on pt next day  Barber, Thereasa Parkin 09/15/2015, 12:51 PM

## 2015-09-15 NOTE — Progress Notes (Signed)
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  Disposition Plan: With continued improvement may consider d/c next am 11/14   Consultants:  Radiation oncology  Procedures:  none  Antibiotics:  Azithromycin and rocephin  HPI/Subjective: Pt reports feeling better today.   Objective: Filed Vitals:   09/15/15 1251  BP: 182/102  Pulse:   Temp:   Resp:    No intake or output data in the 24 hours ending 09/15/15 1412 Filed Weights   09/12/15 0321  Weight: 67.7 kg (149 lb 4 oz)    Exam:   General:  Pt in nad, alert and awake  Cardiovascular: rrr, no mrg  Respiratory: exp wheezes BL, prolonged exp phase, equal chest rise.  Abdomen: soft, ND, NT  Musculoskeletal: no cyanosis or clubbing   Data Reviewed: Basic Metabolic Panel:  Recent Labs Lab 09/11/15 2255 09/12/15 0620  NA 141  --   K 4.3  --   CL 107  --   CO2 27  --   GLUCOSE 106*  --   BUN 16  --   CREATININE 0.69 0.72  CALCIUM 9.1  --    Liver Function Tests: No results for input(s): AST, ALT, ALKPHOS, BILITOT, PROT, ALBUMIN in the last 168 hours. No results for input(s): LIPASE, AMYLASE in the last 168 hours. No results for input(s): AMMONIA in the last 168  hours. CBC:  Recent Labs Lab 09/11/15 2255 09/12/15 0620  WBC 6.3 5.8  HGB 12.1 11.2*  HCT 38.1 35.5*  MCV 98.2 99.2  PLT 156 133*   Cardiac Enzymes:  Recent Labs Lab 09/12/15 0006  TROPONINI <0.03   BNP (last 3 results) No results for input(s): BNP in the last 8760 hours.  ProBNP (last 3 results) No results for input(s): PROBNP in the last 8760 hours.  CBG: No results for input(s): GLUCAP in the last 168 hours.  Recent Results (from the past 240 hour(s))  Culture, expectorated sputum-assessment     Status: None   Collection Time: 09/13/15  6:15 AM  Result Value Ref Range Status   Specimen Description SPUTUM  Final   Special Requests Immunocompromised  Final   Sputum evaluation   Final    THIS SPECIMEN IS ACCEPTABLE. RESPIRATORY CULTURE REPORT TO FOLLOW.   Report Status 09/13/2015 FINAL  Final  Culture, respiratory (NON-Expectorated)     Status: None   Collection Time: 09/13/15  6:15 AM  Result Value Ref Range Status   Specimen Description SPUTUM  Final   Special Requests NONE  Final   Gram Stain   Final    FEW WBC PRESENT, PREDOMINANTLY PMN FEW SQUAMOUS EPITHELIAL CELLS PRESENT FEW GRAM POSITIVE RODS FEW GRAM NEGATIVE RODS FEW GRAM POSITIVE COCCI IN PAIRS    Culture   Final    NORMAL OROPHARYNGEAL FLORA  Performed at [facility]    Report Status 09/15/2015 FINAL  Final     Studies: Dg Chest Port 1 View  09/14/2015  CLINICAL DATA:  Dyspnea EXAM: PORTABLE CHEST 1 VIEW COMPARISON:  09/11/2015 FINDINGS: Porta catheter by right IJ approach, tip at the lower SVC. Normal heart size and stable aortic tortuosity. Chronic mild hyperinflation and interstitial coarsening compatible with COPD in this smoker. There is no edema, consolidation, effusion, or pneumothorax. Osteopenia. No acute fracture. IMPRESSION: 1. No evidence of acute cardiopulmonary disease. 2. COPD. Electronically Signed   By: Kendra Johns M.D.   On: 09/14/2015 07:12    Scheduled  Meds: . [START ON 09/16/2015] amLODipine  10 mg Oral Daily  . aspirin EC  81 mg Oral Daily  . azithromycin  250 mg Oral Daily  . cefTRIAXone (ROCEPHIN)  IV  1 g Intravenous Q24H  . enoxaparin (LOVENOX) injection  40 mg Subcutaneous Q24H  . ipratropium-albuterol  3 mL Nebulization Q4H  . levothyroxine  50 mcg Oral QAC breakfast  . methylPREDNISolone (SOLU-MEDROL) injection  60 mg Intravenous Q12H  . morphine  75 mg Oral 3 times per day  . nicotine  14 mg Transdermal Daily  . senna-docusate  1 tablet Oral Daily  . sodium chloride  3 mL Intravenous Q12H   Continuous Infusions:   Time spent: > 35 minutes    Kendra Johns  Kendra Johns Pager [phone]. If 7PM-7AM, please contact night-coverage at [email or web address], password [facility] 09/15/2015, 2:11 PM  Kendra Johns: 3 days

## 2015-09-16 ENCOUNTER — Ambulatory Visit: Payer: Self-pay | Admitting: Hematology and Oncology

## 2015-09-16 ENCOUNTER — Ambulatory Visit
Admission: RE | Admit: 2015-09-16 | Discharge: 2015-09-16 | Disposition: A | Discharge status: Home / Self Care | Payer: Medicare Other | Source: Ambulatory Visit | Attending: Radiation Oncology | Admitting: Radiation Oncology

## 2015-09-16 DIAGNOSIS — C9002 Multiple myeloma in relapse: Secondary | ICD-10-CM | POA: Diagnosis not present

## 2015-09-16 NOTE — Progress Notes (Signed)
[facility] PROGRESS NOTE  Kendra Johns TRR:[id number] DOB: 16-Jul-1951 DOA: 09/11/2015 PCP: [name], MD  Assessment/Plan: Principal Problem:   COPD exacerbation ([place]) - Continue current solumedrol dose, improving but slowly. Still speaking in fragmented sentences on room air. 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  Disposition Plan: With continued improvement may consider d/c next 1-2 days, still wheezing    Consultants:  Radiation oncology  Procedures:  none  Antibiotics:  Azithromycin and rocephin  HPI/Subjective: Pt reports feeling better today but still not at baseline breathing status.  Objective: Filed Vitals:   09/16/15 1420  BP: 150/96  Pulse: 100  Temp: 98.4 F (36.9 C)  Resp: 20    Intake/Output Summary (Last 24 hours) at 09/16/15 1620 Last data filed at 09/16/15 1420  Gross per 24 hour  Intake    240 ml  Output      0 ml  Net    240 ml   Filed Weights   09/12/15 0321  Weight: 67.7 kg (149 lb 4 oz)    Exam:   General:  Pt in nad, alert and awake  Cardiovascular: rrr, no mrg  Respiratory: exp wheezes BL mild, prolonged exp phase, equal chest rise.  Abdomen: soft, ND, NT  Musculoskeletal: no cyanosis or clubbing   Data Reviewed: Basic Metabolic Panel:  Recent Labs Lab 09/11/15 2255 09/12/15 0620  NA 141  --   K 4.3  --   CL 107  --   CO2 27  --   GLUCOSE 106*  --   BUN 16  --   CREATININE 0.69  0.72  CALCIUM 9.1  --    Liver Function Tests: No results for input(s): AST, ALT, ALKPHOS, BILITOT, PROT, ALBUMIN in the last 168 hours. No results for input(s): LIPASE, AMYLASE in the last 168 hours. No results for input(s): AMMONIA in the last 168 hours. CBC:  Recent Labs Lab 09/11/15 2255 09/12/15 0620  WBC 6.3 5.8  HGB 12.1 11.2*  HCT 38.1 35.5*  MCV 98.2 99.2  PLT 156 133*   Cardiac Enzymes:  Recent Labs Lab 09/12/15 0006  TROPONINI <0.03   BNP (last 3 results) No results for input(s): BNP in the last 8760 hours.  ProBNP (last 3 results) No results for input(s): PROBNP in the last 8760 hours.  CBG: No results for input(s): GLUCAP in the last 168 hours.  Recent Results (from the past 240 hour(s))  Culture, expectorated sputum-assessment     Status: None   Collection Time: 09/13/15  6:15 AM  Result Value Ref Range Status   Specimen Description SPUTUM  Final   Special Requests Immunocompromised  Final   Sputum evaluation   Final    THIS SPECIMEN IS ACCEPTABLE. RESPIRATORY CULTURE REPORT TO FOLLOW.   Report Status 09/13/2015 FINAL  Final  Culture, respiratory (NON-Expectorated)     Status: None   Collection Time: 09/13/15  6:15 AM  Result Value Ref Range Status   Specimen  Description SPUTUM  Final   Special Requests NONE  Final   Gram Stain   Final    FEW WBC PRESENT, PREDOMINANTLY PMN FEW SQUAMOUS EPITHELIAL CELLS PRESENT FEW GRAM POSITIVE RODS FEW GRAM NEGATIVE RODS FEW GRAM POSITIVE COCCI IN PAIRS    Culture   Final    NORMAL OROPHARYNGEAL FLORA Performed at [facility]    Report Status 09/15/2015 FINAL  Final     Studies: No results found.  Scheduled Meds: . amLODipine  10 mg Oral Daily  . aspirin EC  81 mg Oral Daily  . cefTRIAXone (ROCEPHIN)  IV  1 g Intravenous Q24H  . enoxaparin (LOVENOX) injection  40 mg Subcutaneous Q24H  . ipratropium-albuterol  3 mL Nebulization Q4H  . levothyroxine  50 mcg Oral QAC breakfast  .  methylPREDNISolone (SOLU-MEDROL) injection  60 mg Intravenous Q12H  . morphine  75 mg Oral 3 times per day  . nicotine  14 mg Transdermal Daily  . senna-docusate  1 tablet Oral Daily  . sodium chloride  3 mL Intravenous Q12H   Continuous Infusions:   Time spent: > 35 minutes    [name]  [facility] Pager [phone]. If 7PM-7AM, please contact night-coverage at [email or web address], password [facility] 09/16/2015, 4:20 PM  LOS: 4 days

## 2015-09-16 NOTE — Progress Notes (Signed)
Date: September 16, 2015 Chart reviewed for concurrent status and case management needs. Will continue to follow patient for changes and needs: Rhonda Davis, RN, BSN, CCM   [phone] 

## 2015-09-16 NOTE — Care Management Important Message (Signed)
Important Message  Patient Details  Name: GEETHIKA GLEDHILL MRN: [id number] Date of Birth: 08/20/1951   Medicare Important Message Given:  Yes    Camillo Flaming 09/16/2015, 11:45 AMImportant Message  Patient Details  Name: EMILIJA HUSTEAD MRN: [id number] Date of Birth: [birth year]   Medicare Important Message Given:  Yes    Camillo Flaming 09/16/2015, 11:45 AM

## 2015-09-16 NOTE — Progress Notes (Addendum)
Physical Therapy Treatment Patient Details Name: Kendra Johns MRN: [id number] DOB: May 18, 1951 Today's Date: 09/16/2015    History of Present Illness  64 yo female admitted with DOE and cough d/t COPD exacerbation; PMH of depression, HTN, hep B and C, OA, multiple myeloma s/p bone marrow/stem cell transplant in 2009 with disease relapse in 2012. Second bone marrow transplant in 2013. Pt was recently admitted for a pathological fracture to her R femur.     PT Comments    Progressing with mobility. O2 sats at start of session: 98% RA; end of session: 97% RA. Dyspnea 2/4. Fatigues fairly easily. Pt agreeable to [facility] follow up.   Follow Up Recommendations  Home health PT; Intermittent supervision/assist     Equipment Recommendations  None recommended by PT    Recommendations for Other Services       Precautions / Restrictions Precautions Precautions: Fall Restrictions RLE Weight Bearing: Weight bearing as tolerated    Mobility  Bed Mobility Overal bed mobility: Needs Assistance Bed Mobility: Supine to Sit;Sit to Supine     Supine to sit: Modified independent (Device/Increase time);HOB elevated Sit to supine: Modified independent (Device/Increase time);HOB elevated   General bed mobility comments: Pt required increased time and mod VC's for posture during sitting. Pt self-assist with RLE off the bed  Transfers Overall transfer level: Needs assistance Equipment used: Rolling walker (2 wheeled) Transfers: Sit to/from Stand Sit to Stand: Min guard         General transfer comment: close guard for safety. Uncontrolled descent. VCs hand placement  Ambulation/Gait Ambulation/Gait assistance: Min guard Ambulation Distance (Feet): 125 Feet Assistive device: Rolling walker (2 wheeled) Gait Pattern/deviations: Step-through pattern;Decreased stride length;Trunk flexed     General Gait Details: close guard for safety. VCs pursed lip/deep breathing during ambulation. Dyspnea  2/4. 2 brief standing rest breaks taken.   Stairs            Wheelchair Mobility    Modified Rankin (Stroke Patients Only)       Balance                                    Cognition Arousal/Alertness: Awake/alert Behavior During Therapy: WFL for tasks assessed/performed Overall Cognitive Status: Within Functional Limits for tasks assessed                      Exercises      General Comments        Pertinent Vitals/Pain Pain Assessment: No/denies pain    Home Living Family/patient expects to be discharged to:: Private residence Living Arrangements: Spouse/significant other;Other relatives (husband and 7 yo grandson) Available Help at Discharge: Family;Available 24 hours/day Type of Home: House Home Access: Stairs to enter Entrance Stairs-Rails: Can reach both Home Layout: One level Home Equipment: Walker - 4 wheels;Shower seat;Hand held Tourist information centre manager - 2 wheels      Prior Function Level of Independence: Independent with assistive device(s)      Comments: Amb at home with RW, I with ADLs   PT Goals (current goals can now be found in the care plan section) Progress towards PT goals: Progressing toward goals    Frequency  Min 3X/week    PT Plan Current plan remains appropriate    Co-evaluation             End of Session   Activity Tolerance: Patient limited by fatigue Patient left: in  bed;with call bell/phone within reach;with bed alarm set     Time: 9794-9971 PT Time Calculation (min) (ACUTE ONLY): 12 min  Charges:  $Gait Training: 8-22 mins                    G Codes:      Weston Anna, MPT Pager: [phone]

## 2015-09-16 NOTE — Progress Notes (Signed)
RN agrees with previous Psychiatrist.

## 2015-09-16 NOTE — Evaluation (Signed)
Occupational Therapy Evaluation Patient Details Name: Kendra Johns MRN: [id number] DOB: 1951/03/31 Today's Date: 09/16/2015    History of Present Illness  64 yo female admitted with DOE and cough d/t COPD exacerbation; PMH of depression, HTN, hep B and C, OA, multiple myeloma s/p bone marrow/stem cell transplant in 2009 with disease relapse in 2012. Second bone marrow transplant in 2013. Pt was recently admitted for a pathological fracture to her R femur.    Clinical Impression   Pt admitted with cough. Pt currently with functional limitations due to the deficits listed below (see OT Problem List). Pt will benefit from skilled OT to increase their safety and independence with ADL and functional mobility for ADL to facilitate discharge to venue listed below.     Follow Up Recommendations  Home health OT;Supervision - Intermittent    Equipment Recommendations  3 in 1 bedside comode       Precautions / Restrictions Precautions Precautions: Fall Restrictions RLE Weight Bearing: Weight bearing as tolerated      Mobility Bed Mobility Overal bed mobility: Needs Assistance Bed Mobility: Supine to Sit     Supine to sit: Supervision;HOB elevated     General bed mobility comments: Pt required increased time and mod VC's for posture during sitting. Pt self-assist with RLE off the bed  Transfers Overall transfer level: Needs assistance Equipment used: Rolling walker (2 wheeled)   Sit to Stand: Min guard         General transfer comment: Min guard for safety; min VC's for hand placement. Pt requires increased time to rise, increased SOB, Oxygen level maintained >98% on RA.    Balance                                            ADL Overall ADL's : Needs assistance/impaired Eating/Feeding: Independent;Sitting   Grooming: Wash/dry hands;Sitting;Set up               Lower Body Dressing: Minimal assistance;Sit to/from stand;Cueing for  sequencing;Cueing for safety   Toilet Transfer: Min guard;RW;Ambulation;Comfort height toilet                             Pertinent Vitals/Pain Pain Assessment: No/denies pain     Hand Dominance     Extremity/Trunk Assessment Upper Extremity Assessment Upper Extremity Assessment: Generalized weakness           Communication Communication Communication: No difficulties   Cognition Arousal/Alertness: Awake/alert Behavior During Therapy: WFL for tasks assessed/performed Overall Cognitive Status: Within Functional Limits for tasks assessed                                Home Living Family/patient expects to be discharged to:: Private residence Living Arrangements: Spouse/significant other;Other relatives (husband and 71 yo grandson) Available Help at Discharge: Family;Available 24 hours/day Type of Home: House Home Access: Stairs to enter [employer] of Steps: 3 Entrance Stairs-Rails: Can reach both Home Layout: One level     Bathroom Shower/Tub: Occupational psychologist: Standard     Home Equipment: Environmental consultant - 4 wheels;Shower seat;Hand held Tourist information centre manager - 2 wheels          Prior Functioning/Environment Level of Independence: Independent with assistive device(s)  Comments: Amb at home with RW, I with ADLs    OT Diagnosis: Generalized weakness   OT Problem List: Decreased strength;Decreased activity tolerance;Impaired balance (sitting and/or standing);Decreased safety awareness;Decreased knowledge of use of DME or AE;Pain      OT Goals(Current goals can be found in the care plan section) ADL Goals Pt Will Perform Grooming: with modified independence;standing Pt Will Perform Lower Body Dressing: with modified independence;sit to/from stand Pt Will Transfer to Toilet: with modified independence;regular height toilet Pt Will Perform Toileting - Clothing Manipulation and hygiene: sit to/from stand;with  modified independence  OT Frequency: Min 2X/week   Barriers to D/C:               End of Session Equipment Utilized During Treatment: Gait belt;Rolling walker Nurse Communication: Other (comment) (spoke with cna requesting bucket for bsc for use later)  Activity Tolerance: Patient tolerated treatment well Patient left: in chair;with call bell/phone within reach   Time: 1346-1402 OT Time Calculation (min): 16 min Charges:  OT General Charges $OT Visit: 1 Procedure OT Evaluation $Initial OT Evaluation Tier I: 1 Procedure G-Codes:    Payton Mccallum D 10/06/2015, 2:04 PM

## 2015-09-17 ENCOUNTER — Ambulatory Visit
Admission: RE | Admit: 2015-09-17 | Discharge: 2015-09-17 | Disposition: A | Discharge status: Home / Self Care | Payer: Medicare Other | Source: Ambulatory Visit | Attending: Radiation Oncology | Admitting: Radiation Oncology

## 2015-09-17 ENCOUNTER — Other Ambulatory Visit: Payer: Self-pay | Admitting: Hematology and Oncology

## 2015-09-17 MED ORDER — IPRATROPIUM-ALBUTEROL 0.5-2.5 (3) MG/3ML IN SOLN: 3.0000 mL | Freq: Four times a day (QID) | RESPIRATORY_TRACT | Status: DC

## 2015-09-17 MED ORDER — CEFDINIR 300 MG PO CAPS: 300.0000 mg | ORAL_CAPSULE | Freq: Two times a day (BID) | ORAL | Status: DC

## 2015-09-17 MED ORDER — PREDNISONE 10 MG PO TABS: 10.0000 mg | ORAL_TABLET | ORAL | Status: DC

## 2015-09-17 MED ORDER — NICOTINE 14 MG/24HR TD PT24: 14.0000 mg | MEDICATED_PATCH | Freq: Every day | TRANSDERMAL | Status: DC

## 2015-09-17 MED ORDER — HEPARIN SOD (PORK) LOCK FLUSH 100 UNIT/ML IV SOLN
500.0000 [IU] | INTRAVENOUS | Status: AC | PRN
  Administered 2015-09-17: 500 [IU]

## 2015-09-17 NOTE — Discharge Summary (Signed)
Physician Discharge Summary  Kendra Johns FFM:[id number] DOB: [birth year] DOA: 09/11/2015  PCP: [name], MD  Admit date: 09/11/2015 Discharge date: 09/17/2015  Time spent: > 35 minutes  Recommendations for Outpatient Follow-up:  1. 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 female with a past medical history significant for multiple myeloma s/p BMT in 2009 and 2013 with third recurrence and bone involvement, hepatitis C not requiring treatment, HTN, smoking, and recent pathologic fractures of the right hip and failed nailing who presents with dyspnea and cough. Suspected copde  Hospital Course:  Principal Problem:  COPD exacerbation ([place]) - Continue prednisone on d/c with taper - continue albuterol -  D/c with 1 more day of antibiotic to complete a 7 day treatment course.  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 - pt getting radiation therapy to her L1 and L2 and right femur  Procedures:  none  Consultations:  none  Discharge Exam: Filed Vitals:   09/17/15 1459  BP: 158/99  Pulse: 105  Temp: 97.7 F (36.5 C)  Resp: 20    General: Pt in nad, alert and awake Cardiovascular:  rrr, no rubs Respiratory: equal chest rise, speaking in full sentences, no increased wob  Discharge Instructions   Discharge Instructions    Call MD for:  temperature >100.4    Complete by:  As directed      Diet - low sodium heart healthy    Complete by:  As directed      Discharge instructions    Complete by:  As directed   Recommend you follow up with your primary care physician within the next 1-2 weeks or sooner.   Also refrain from smoking tobacco     Increase activity slowly    Complete by:  As directed           Current Discharge Medication List    START taking these medications   Details  cefdinir (OMNICEF) 300 MG capsule Take 1 capsule (300 mg total) by mouth 2 (two) times daily. Qty: 2 capsule, Refills: 0    nicotine (NICODERM CQ - DOSED IN MG/24 HOURS) 14 mg/24hr patch Place 1 patch (14 mg total) onto the skin daily. Qty: 28 patch, Refills: 0    predniSONE (DELTASONE) 10 MG tablet Take 1 tablet (10 mg total) by mouth as directed. Take 40 mg (4 tablets by mouth) for the next 3 days, then take 30 mg (3 tablets by mouth) for the next 3 days, then take 20 mg (2 tablest by mouth) for the next 3 days, then take 10 mg (1 tablet) by mouth for the next 3 days. Qty: 35 tablet, Refills: 0  CONTINUE these medications which have NOT CHANGED   Details  aspirin 81 MG tablet Take 81 mg by mouth daily.    Cholecalciferol (VITAMIN D3) 5000 UNITS CAPS Take 5,000 Units by mouth daily.     cyclobenzaprine (FLEXERIL) 10 MG tablet Take 1 tablet (10 mg total) by mouth 3 (three) times daily as needed for muscle spasms. Qty: 15 tablet, Refills: 0   Associated Diagnoses: Muscle cramp    dexamethasone (DECADRON) 4 MG tablet TAKE 1 TABLET (4 MG TOTAL) BY MOUTH DAILY. Qty: 30 tablet, Refills: 1    ipratropium (ATROVENT HFA) 17 MCG/ACT inhaler Inhale 2 puffs into the lungs every 4 (four) hours as needed for wheezing. Qty: 1 Inhaler, Refills: 12   Associated Diagnoses: Smoker; Cough     levothyroxine (SYNTHROID) 50 MCG tablet Take 1 tablet (50 mcg total) by mouth daily before breakfast. Qty: 30 tablet, Refills: 3    magic mouthwash SOLN Take 10 mLs by mouth 4 (four) times daily as needed for mouth pain. Swish and spit  Or  Swish and swallow. Qty: 480 mL, Refills: 2   Associated Diagnoses: Multiple myeloma ([facility])    metoprolol (LOPRESSOR) 100 MG tablet Take 1 tablet (100 mg total) by mouth 2 (two) times daily.    morphine (MS CONTIN) 15 MG 12 hr tablet Take 5 tablets (75 mg total) by mouth every 8 (eight) hours. Qty: 60 tablet, Refills: 0    morphine (MSIR) 30 MG tablet Take 1-1.5 tablets (30-45 mg total) by mouth every 2 (two) hours as needed for moderate pain or severe pain. Qty: 30 tablet, Refills: 0    senna-docusate (SENOKOT-S) 8.6-50 MG tablet Take 1 tablet by mouth daily.    magnesium hydroxide (MILK OF MAGNESIA) 400 MG/5ML suspension Take 30 mLs by mouth daily as needed for mild constipation (constipation).       STOP taking these medications     lidocaine-prilocaine (EMLA) cream      Sodium Phosphates (RA SALINE ENEMA RE)      bisacodyl (DULCOLAX) 10 MG suppository      sucralfate (CARAFATE) 1 G tablet        Allergies  Allergen Reactions  . Codeine Itching  . Ibuprofen Other (See Comments)    REACTION: gi trouble  . Albuterol Hives    Pt tolerated albuterol neb without issue 12/2014      The results of significant diagnostics from this hospitalization (including imaging, microbiology, ancillary and laboratory) are listed below for reference.    Significant Diagnostic Studies: Pelvis Portable  08/22/2015  CLINICAL DATA:  Right Hip fracture requiring operative repair ([place]), post op EXAM: PORTABLE PELVIS 1-2 VIEWS COMPARISON:  08/22/2015 FINDINGS: Status post right IM nail/lag screw fixation of right sub trochanteric pathologic hip fracture. Femoral head appears located in the acetabulum. Previous vertebroplasty. IMPRESSION: Status post ORIF  right hip. Electronically Signed   By: Nolon Nations M.D.   On: 08/22/2015 17:13   Ct Hip Right Wo Contrast  09/05/2015  CLINICAL DATA:  Right hip pain. EXAM: CT OF THE RIGHT HIP WITHOUT CONTRAST TECHNIQUE: Multidetector CT imaging of the right hip was performed according to the standard protocol. Multiplanar CT image reconstructions were also generated. COMPARISON:  X-ray right hip 12/19/2014 FINDINGS: There is a mildly comminuted pathologic fracture of the proximal right femoral diaphysis transfixed with an intra medullary nail and cannulated femoral neck screw. There is mild surrounding callus formation. There are multiple lytic lesions involving the right ilium, right acetabulum, right femoral head and  right ischium. There is a subtle lucency through the cortex of the right ischium which may reflect a nondisplaced fracture a (image 322/ series 3). There is a small hematoma in the subcutaneous fat measuring 2.5 x 4 cm likely postsurgical. There is no other right hip fracture or dislocation. IMPRESSION: 1. Mildly comminuted pathologic fracture of the proximal right femoral diaphysis transfixed with an intra medullary nail and cannulated femoral neck screw. There is mild surrounding callus formation. 2. Multiple lytic lesions involving the right ilium, right acetabulum, right femoral head and right ischium consistent with multiple myeloma. Possible subtle nondisplaced pathologic fracture of the the right ischium (image 322/ series 3). Electronically Signed   By: Kathreen Devoid   On: 09/05/2015 14:09   Dg Chest Port 1 View  09/14/2015  CLINICAL DATA:  Dyspnea EXAM: PORTABLE CHEST 1 VIEW COMPARISON:  09/11/2015 FINDINGS: Porta catheter by right IJ approach, tip at the lower SVC. Normal heart size and stable aortic tortuosity. Chronic mild hyperinflation and interstitial coarsening compatible with COPD in this smoker. There is no edema, consolidation, effusion, or pneumothorax. Osteopenia. No acute fracture.  IMPRESSION: 1. No evidence of acute cardiopulmonary disease. 2. COPD. Electronically Signed   By: Monte Fantasia M.D.   On: 09/14/2015 07:12   Dg Chest Portable 1 View  09/11/2015  CLINICAL DATA:  Shortness of breath and wheezing EXAM: PORTABLE CHEST 1 VIEW COMPARISON:  12/28/2014 FINDINGS: Normal heart size and stable aortic tortuosity. Right IJ porta catheter, tip at the lower SVC. Lower lung volumes with interstitial crowding. There is no edema, consolidation, effusion, or pneumothorax. Remote right-sided rib fractures. No acute osseous finding. IMPRESSION: No acute finding. Electronically Signed   By: Monte Fantasia M.D.   On: 09/11/2015 23:53   Dg C-arm 1-60 Min  08/22/2015  CLINICAL DATA:  Multiple myeloma, with a pathologic fracture. RIGHT leg pain. EXAM: RIGHT FEMUR 2 VIEWS; DG C-ARM 61-120 MIN COMPARISON:  Plain films most recent 08/21/2015. FINDINGS: Intra medullary rod with compression screw has been placed across the proximal femoral subtrochanteric pathologic fracture. Satisfactory position and alignment. IMPRESSION: As above. Electronically Signed   By: Staci Righter M.D.   On: 08/22/2015 16:31   Dg Hip Unilat With Pelvis 2-3 Views Right  09/05/2015  CLINICAL DATA:  Right hip pain. Multiple myeloma. Recent right hip pinning 08/22/2015 EXAM: DG HIP (WITH OR WITHOUT PELVIS) 2-3V RIGHT COMPARISON:  08/22/2015, 08/14/2015 FINDINGS: Compression screw and locking intra medullary rod in the right femur. Hardware in satisfactory position. Lytic lesion in the subtrochanteric femur with pathologic transverse fracture. Slight increase in fracture displacement compared with 08/22/2015. Lytic lesion right ischium  is unchanged from the prior study. Cement vertebral augmentation  L5 and the right sacrum unchanged. IMPRESSION: Lytic lesion proximal right femur with pathologic fracture. Slight increase in displacement of the fracture compared with 08/22/2015. Compression screw and rod remain in  satisfactory position. Lytic lesion right ischium unchanged. Electronically Signed   By: Franchot Gallo M.D.   On: 09/05/2015 08:31   Dg Femur 1v Right  08/21/2015  CLINICAL DATA:  Multiple myeloma, known RIGHT femoral tumor, increased pain for 1 day EXAM: RIGHT FEMUR 1 VIEW COMPARISON:  08/14/2015 FINDINGS: Osseous demineralization. Poorly defined lytic lesions are identified at the proximal and proximal/mid RIGHT femoral diaphysis compatible with multiple myeloma. Endosteal cortical thinning at the larger more proximal femoral lytic lesion. A small amount of periosteal reaction is identified adjacent to the more cranial of the proximal femoral lesions, located in the subtrochanteric  region. Additionally, subtle linear cortical lucency is seen in the medial cortex of the proximal RIGHT femur inferior to the lesser trochanter likely representing a developing pathologic fracture. Tiny rounded lytic lesion of the distal RIGHT femoral metaphysis is noted. Knee and hip joint alignments normal. No additional fracture, dislocation or bone destruction. IMPRESSION: Lytic lesions within the RIGHT femoral diaphysis compatible with multiple myeloma. Periosteal reaction and subtle cortical linear lucency at thinned medial cortex of the proximal RIGHT femoral diaphysis inferior to the lesser trochanter likely representing a developing pathologic fracture. Electronically Signed   By: Lavonia Dana M.D.   On: 08/21/2015 16:45   Dg Femur, Min 2 Views Right  09/05/2015  CLINICAL DATA:  Right hip pain. Multiple myeloma. Right hip pinning 08/22/2015 EXAM: RIGHT FEMUR 2 VIEWS COMPARISON:  08/21/2015 and 08/22/2015 FINDINGS: Compression screw and locking intra medullary rod right femur. Rod extends into the distal femur. There are 2 screws in the distal femur. Lytic lesion subtrochanteric femur with pathologic fracture. Fracture displacement has progressed slightly since 08/22/2015. No other fracture. No other lytic lesion  identified in the femur Lytic lesion right ischium  unchanged compatible with myeloma. Right hip joint normal. IMPRESSION: Right femur screw and rod in good position Lytic lesion proximal femur with pathologic fracture. Fracture displacement slightly increased from 08/22/2015. Electronically Signed   By: Franchot Gallo M.D.   On: 09/05/2015 08:33   Dg Femur, Min 2 Views Right  08/22/2015  CLINICAL DATA:  Multiple myeloma, with a pathologic fracture. RIGHT leg pain. EXAM: RIGHT FEMUR 2 VIEWS; DG C-ARM 61-120 MIN COMPARISON:  Plain films most recent 08/21/2015. FINDINGS: Intra medullary rod with compression screw has been placed across the proximal femoral subtrochanteric pathologic fracture. Satisfactory position and alignment. IMPRESSION: As above. Electronically Signed   By: Staci Righter M.D.   On: 08/22/2015 16:31    Microbiology: Recent Results (from the past 240 hour(s))  Culture, expectorated sputum-assessment     Status: None   Collection Time: 09/13/15  6:15 AM  Result Value Ref Range Status   Specimen Description SPUTUM  Final   Special Requests Immunocompromised  Final   Sputum evaluation   Final    THIS SPECIMEN IS ACCEPTABLE. RESPIRATORY CULTURE REPORT TO FOLLOW.   Report Status 09/13/2015 FINAL  Final  Culture, respiratory (NON-Expectorated)     Status: None   Collection Time: 09/13/15  6:15 AM  Result Value Ref Range Status   Specimen Description SPUTUM  Final   Special Requests NONE  Final   Gram Stain   Final    FEW WBC PRESENT, PREDOMINANTLY PMN FEW SQUAMOUS EPITHELIAL CELLS PRESENT FEW GRAM POSITIVE RODS FEW GRAM NEGATIVE RODS FEW GRAM POSITIVE COCCI IN PAIRS    Culture   Final    NORMAL OROPHARYNGEAL FLORA Performed at [facility]    Report Status 09/15/2015 FINAL  Final     Labs: Basic Metabolic Panel:  Recent Labs Lab 09/11/15 2255 09/12/15 0620  NA 141  --   K 4.3  --   CL 107  --   CO2 27  --   GLUCOSE 106*  --   BUN 16  --   CREATININE  0.69 0.72  CALCIUM 9.1  --    Liver Function Tests: No results for input(s): AST, ALT, ALKPHOS, BILITOT, PROT, ALBUMIN in the last 168 hours. No results for input(s): LIPASE, AMYLASE in the last 168 hours. No results for input(s): AMMONIA in the last 168 hours. CBC:  Recent Labs Lab  09/11/15 2255 09/12/15 0620  WBC 6.3 5.8  HGB 12.1 11.2*  HCT 38.1 35.5*  MCV 98.2 99.2  PLT 156 133*   Cardiac Enzymes:  Recent Labs Lab 09/12/15 0006  TROPONINI <0.03   BNP: BNP (last 3 results) No results for input(s): BNP in the last 8760 hours.  ProBNP (last 3 results) No results for input(s): PROBNP in the last 8760 hours.  CBG: No results for input(s): GLUCAP in the last 168 hours.     Signed:  [name]  [facility] 09/17/2015, 3:35 PM

## 2015-09-18 ENCOUNTER — Telehealth: Payer: Self-pay | Admitting: Family Medicine

## 2015-09-18 ENCOUNTER — Ambulatory Visit
Admission: RE | Admit: 2015-09-18 | Discharge: 2015-09-18 | Disposition: A | Discharge status: Home / Self Care | Payer: Medicare Other | Source: Ambulatory Visit | Attending: Radiation Oncology | Admitting: Radiation Oncology

## 2015-09-18 DIAGNOSIS — Z9221 Personal history of antineoplastic chemotherapy: Secondary | ICD-10-CM | POA: Diagnosis not present

## 2015-09-18 DIAGNOSIS — Z510 Encounter for antineoplastic radiation therapy: Secondary | ICD-10-CM | POA: Diagnosis not present

## 2015-09-18 DIAGNOSIS — C9000 Multiple myeloma not having achieved remission: Secondary | ICD-10-CM | POA: Diagnosis not present

## 2015-09-18 NOTE — Telephone Encounter (Signed)
Pt was discharge from [facility] yesterday and angela would like orders to resume care for PT and OT

## 2015-09-19 ENCOUNTER — Telehealth: Payer: Self-pay | Admitting: Hematology and Oncology

## 2015-09-19 ENCOUNTER — Telehealth: Payer: Self-pay | Admitting: *Deleted

## 2015-09-19 ENCOUNTER — Ambulatory Visit
Admission: RE | Admit: 2015-09-19 | Discharge: 2015-09-19 | Disposition: A | Discharge status: Home / Self Care | Payer: Medicare Other | Source: Ambulatory Visit | Attending: Radiation Oncology | Admitting: Radiation Oncology

## 2015-09-19 ENCOUNTER — Ambulatory Visit (HOSPITAL_BASED_OUTPATIENT_CLINIC_OR_DEPARTMENT_OTHER): Payer: Medicare Other | Admitting: Hematology and Oncology

## 2015-09-19 VITALS — BP 155/100 | HR 76 | Temp 98.5°F | Resp 18 | Ht 64.0 in | Wt 142.9 lb

## 2015-09-19 DIAGNOSIS — J441 Chronic obstructive pulmonary disease with (acute) exacerbation: Secondary | ICD-10-CM | POA: Diagnosis not present

## 2015-09-19 DIAGNOSIS — C9002 Multiple myeloma in relapse: Secondary | ICD-10-CM

## 2015-09-19 DIAGNOSIS — Z510 Encounter for antineoplastic radiation therapy: Secondary | ICD-10-CM | POA: Diagnosis not present

## 2015-09-19 MED ORDER — ALPRAZOLAM 0.25 MG PO TABS: 0.2500 mg | ORAL_TABLET | Freq: Every evening | ORAL | Status: DC | PRN

## 2015-09-19 MED ORDER — POMALIDOMIDE 4 MG PO CAPS: 4.0000 mg | ORAL_CAPSULE | Freq: Every day | ORAL | Status: DC

## 2015-09-19 NOTE — Telephone Encounter (Signed)
Left message on machine for patient to return our call.  Transitional care mangement

## 2015-09-19 NOTE — Telephone Encounter (Signed)
Verbal orders given to Angela. 

## 2015-09-19 NOTE — Progress Notes (Signed)
[facility] OFFICE PROGRESS NOTE  Patient Care Team: Marin Olp, MD as PCP - General (Family Medicine) Jeanann Lewandowsky, MD as Consulting Physician (Medical Oncology) Marin Olp, MD as Consulting Physician (Family Medicine) Heath Lark, MD as Consulting Physician (Hematology and Oncology)  SUMMARY OF ONCOLOGIC HISTORY: Oncology History   Multiple myeloma, kappa light chain disease, Durie-Salmon stage III     Multiple myeloma in relapse ([place])   07/16/2006 Bone Marrow Biopsy BM biopsy is non-diagnostic   09/21/2006 Procedure L5 vertebral biopsy 5% plasma cell   10/25/2006 Bone Marrow Biopsy BM biopsy is hypercellular with 5% plasma cell   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   01/03/2007 - 01/10/2007 Radiation Therapy Approximate date only, received RT for plasmacytoma followed by surgery   09/14/2007 Initial Diagnosis MULTIPLE  MYELOMA   10/05/2007 Bone Marrow Biopsy Bm biopsy was negative   06/18/2008 Bone Marrow Biopsy BM biopsy was negative   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 [facility]   04/13/2011 Relapse/Recurrence Disease relapse   04/14/2011 Bone Marrow Biopsy Bm biopsy showed 2 % plasma cell   04/27/2011 Relapse/Recurrence Disease relapse, treated with Velcade/Cytoxan/Dex   09/07/2011 - 07/28/2013 Chemotherapy She has been receiving Velcade   12/27/2011 Bone Marrow Transplant 2nd transplant at [facility]   07/28/2013 Relapse/Recurrence Chemo is stopped due to progression of disease   08/29/2013 Imaging PEt/CT showed recurrence of disease with new lesion on her rib with compression fracture   09/13/2013 Bone Marrow Biopsy BM biopsy is hypercellular with 6% plasma cell   10/10/2013 - 10/20/2013 Radiation Therapy Started on palliative XRT for rib pain   11/27/2013 - 04/06/2014 Chemotherapy The patient starts chemotherapy with Carfilzomib   07/19/2014 Imaging Repeat bloodwork and PET/CT scan shows significant disease progression.   08/02/2014  - 03/06/2015 Chemotherapy She enrolled in clinical trial using combination therapy with Revlimid, dexamethasone and Elotuzumab   03/07/2015 - 06/04/2015 Chemotherapy She is on maintenance Revlimid only without dexamethasone   05/27/2015 Imaging  MRI spine showed new compression fracture.   06/04/2015 - 06/18/2015 Radiation Therapy  she received palliative radiation therapy to 25 Gy C7-T1   07/10/2015 -  Chemotherapy She received Elotuzumab, dex and Revlimid   08/01/2015 Imaging MRI of the spine was performed today due to new onset of worsening right hip pain/flank area. MRI show mild progression of the L1 vertebral body.    INTERVAL HISTORY: Please see below for problem oriented charting.  she returns for further follow-up. She have recurrent hospitalization from femur fracture and recent COPD exacerbation. She went to [facility] recently for evaluation and treatment options. She felt a bit better. Her pain appears to be under control.  REVIEW OF SYSTEMS:   Constitutional: Denies fevers, chills or abnormal weight loss Eyes: Denies blurriness of vision Ears, nose, mouth, throat, and face: Denies mucositis or sore throat Respiratory: Denies cough, dyspnea or wheezes Cardiovascular: Denies palpitation, chest discomfort or lower extremity swelling Gastrointestinal:  Denies nausea, heartburn or change in bowel habits Skin: Denies abnormal skin rashes Lymphatics: Denies new lymphadenopathy or easy bruising Neurological:Denies numbness, tingling or new weaknesses Behavioral/Psych: Mood is stable, no new changes  All other systems were reviewed with the patient and are negative.  I have reviewed the past medical history, past surgical history, social history and family history with the patient and they are unchanged from previous note.  ALLERGIES:  is allergic to codeine; ibuprofen; and albuterol.  MEDICATIONS:  Current Outpatient Prescriptions  Medication Sig Dispense Refill  .  ALPRAZolam (XANAX) 0.25 MG  tablet Take 1 tablet (0.25 mg total) by mouth at bedtime as needed for anxiety. 30 tablet 0  . aspirin 81 MG tablet Take 81 mg by mouth daily.    . cefdinir (OMNICEF) 300 MG capsule Take 1 capsule (300 mg total) by mouth 2 (two) times daily. 2 capsule 0  . Cholecalciferol (VITAMIN D3) 5000 UNITS CAPS Take 5,000 Units by mouth daily.     . cyclobenzaprine (FLEXERIL) 10 MG tablet Take 1 tablet (10 mg total) by mouth 3 (three) times daily as needed for muscle spasms. 15 tablet 0  . dexamethasone (DECADRON) 4 MG tablet TAKE 1 TABLET (4 MG TOTAL) BY MOUTH DAILY. 30 tablet 1  . ipratropium (ATROVENT HFA) 17 MCG/ACT inhaler Inhale 2 puffs into the lungs every 4 (four) hours as needed for wheezing. 1 Inhaler 12  . levothyroxine (SYNTHROID) 50 MCG tablet Take 1 tablet (50 mcg total) by mouth daily before breakfast. 30 tablet 3  . magic mouthwash SOLN Take 10 mLs by mouth 4 (four) times daily as needed for mouth pain. Swish and spit  Or  Swish and swallow. 480 mL 2  . magnesium hydroxide (MILK OF MAGNESIA) 400 MG/5ML suspension Take 30 mLs by mouth daily as needed for mild constipation (constipation).     . metoprolol (LOPRESSOR) 100 MG tablet Take 1 tablet (100 mg total) by mouth 2 (two) times daily.    Marland Kitchen morphine (MS CONTIN) 15 MG 12 hr tablet Take 5 tablets (75 mg total) by mouth every 8 (eight) hours. (Patient taking differently: Take 75 mg by mouth every 8 (eight) hours as needed for pain. ) 60 tablet 0  . morphine (MSIR) 30 MG tablet Take 1-1.5 tablets (30-45 mg total) by mouth every 2 (two) hours as needed for moderate pain or severe pain. (Patient taking differently: Take 30-45 mg by mouth every 2 (two) hours as needed for moderate pain or severe pain. Take one tablet every 2 hours as needed for moderate pain) 30 tablet 0  . nicotine (NICODERM CQ - DOSED IN MG/24 HOURS) 14 mg/24hr patch Place 1 patch (14 mg total) onto the skin daily. 28 patch 0  . pomalidomide (POMALYST) 4 MG capsule Take 1 capsule (4  mg total) by mouth daily. Take with water on days 1-21. Repeat every 28 days. 21 capsule 0  . predniSONE (DELTASONE) 10 MG tablet Take 1 tablet (10 mg total) by mouth as directed. Take 40 mg (4 tablets by mouth) for the next 3 days, then take 30 mg (3 tablets by mouth) for the next 3 days, then take 20 mg (2 tablest by mouth) for the next 3 days, then take 10 mg (1 tablet) by mouth for the next 3 days. 35 tablet 0  . senna-docusate (SENOKOT-S) 8.6-50 MG tablet Take 1 tablet by mouth daily.    . [DISCONTINUED] amLODipine (NORVASC) 5 MG tablet Take 1 tablet (5 mg total) by mouth daily. 90 tablet 3  . [DISCONTINUED] gabapentin (NEURONTIN) 600 MG tablet Take 600 mg by mouth 3 (three) times daily.       No current facility-administered medications for this visit.    PHYSICAL EXAMINATION: ECOG PERFORMANCE STATUS: 2 - Symptomatic, <50% confined to bed  Filed Vitals:   09/19/15 1340  BP: 155/100  Pulse: 76  Temp: 98.5 F (36.9 C)  Resp: 18   Filed Weights   09/19/15 1340  Weight: 142 lb 14.4 oz (64.819 kg)    GENERAL:alert, no distress and  comfortable. She is sitting on the wheelchair SKIN: skin color, texture, turgor are normal, no rashes or significant lesions EYES: normal, Conjunctiva are pink and non-injected, sclera clear OROPHARYNX:no exudate, no erythema and lips, buccal mucosa, and tongue normal  NECK: supple, thyroid normal size, non-tender, without nodularity LYMPH:  no palpable lymphadenopathy in the cervical, axillary or inguinal LUNGS: clear to auscultation and percussion with normal breathing effort. Scattered wheezes are noted HEART: regular rate & rhythm and no murmurs and no lower extremity edema ABDOMEN:abdomen soft, non-tender and normal bowel sounds Musculoskeletal:no cyanosis of digits and no clubbing  NEURO: alert & oriented x 3 with fluent speech, no focal motor/sensory deficits  LABORATORY DATA:  I have reviewed the data as listed    Component Value Date/Time    NA 141 09/11/2015 2255   NA 140 08/07/2015 0932   K 4.3 09/11/2015 2255   K 3.3* 08/07/2015 0932   CL 107 09/11/2015 2255   CL 107 03/10/2013 1014   CO2 27 09/11/2015 2255   CO2 23 08/07/2015 0932   GLUCOSE 106* 09/11/2015 2255   GLUCOSE 106 08/07/2015 0932   GLUCOSE 111* 03/10/2013 1014   GLUCOSE 98 08/31/2006 1024   BUN 16 09/11/2015 2255   BUN 11.8 08/07/2015 0932   CREATININE 0.72 09/12/2015 0620   CREATININE 0.8 08/07/2015 0932   CALCIUM 9.1 09/11/2015 2255   CALCIUM 9.1 08/07/2015 0932   PROT 6.2* 08/07/2015 0932   PROT 6.6 09/11/2014 0336   ALBUMIN 3.6 08/07/2015 0932   ALBUMIN 3.4* 09/11/2014 0336   AST 12 08/07/2015 0932   AST 18 09/11/2014 0336   ALT 13 08/07/2015 0932   ALT 18 09/11/2014 0336   ALKPHOS 100 08/07/2015 0932   ALKPHOS 113 09/11/2014 0336   BILITOT 0.39 08/07/2015 0932   BILITOT 0.6 09/11/2014 0336   GFRNONAA >60 09/12/2015 0620   GFRAA >60 09/12/2015 0620    No results found for: SPEP, UPEP  Lab Results  Component Value Date   WBC 5.8 09/12/2015   NEUTROABS 4.1 09/05/2015   HGB 11.2* 09/12/2015   HCT 35.5* 09/12/2015   MCV 99.2 09/12/2015   PLT 133* 09/12/2015      Chemistry      Component Value Date/Time   NA 141 09/11/2015 2255   NA 140 08/07/2015 0932   K 4.3 09/11/2015 2255   K 3.3* 08/07/2015 0932   CL 107 09/11/2015 2255   CL 107 03/10/2013 1014   CO2 27 09/11/2015 2255   CO2 23 08/07/2015 0932   BUN 16 09/11/2015 2255   BUN 11.8 08/07/2015 0932   CREATININE 0.72 09/12/2015 0620   CREATININE 0.8 08/07/2015 0932      Component Value Date/Time   CALCIUM 9.1 09/11/2015 2255   CALCIUM 9.1 08/07/2015 0932   ALKPHOS 100 08/07/2015 0932   ALKPHOS 113 09/11/2014 0336   AST 12 08/07/2015 0932   AST 18 09/11/2014 0336   ALT 13 08/07/2015 0932   ALT 18 09/11/2014 0336   BILITOT 0.39 08/07/2015 0932   BILITOT 0.6 09/11/2014 0336      ASSESSMENT & PLAN:  Multiple myeloma in relapse ([place]) I have a long discussion with  the patient and her daughters. Since recent hospitalization, her COPD appears to be under control. Her daughter is concerned because based on their recent visit to [facility], the patient was given overall survival in less than 3 months. I clarified that with the patient and her family. With the patient ongoing smoking habits, with recurrent  COPD exacerbation, it can present dispose her to respiratory failure and death. Sometimes, the cause of death may not necessarily be related to multiple myeloma but rather from infection. If the patient could not tolerate further chemotherapy, her survival will be very short. However, if she continues to improve, she would be a candidate for aggressive treatment. Per recommendation from [place], we discussed the risks, benefits, side effects of Pomalidomide My plan would be to start her on Pomalidomide for at least a month and see how she tolerates that. If she feels well and her performance status continues to improve, the plan would be to add Daratumumab in the future. She will complete radiation treatment tomorrow. I recommend minimum 7-10 days break and plan to start her on Pomalidomide by 09/30/2015. I plan to see her back at the second week of December for further review, blood work and toxicity review.  COPD exacerbation ([place])  She had recent COPD exacerbation. Examination today showed that her COPD is under control. She will continue a taper course of steroids and continue to use inhaler. Again, I recommend the patient to stop smoking   No orders of the defined types were placed in this encounter.   All questions were answered. The patient knows to call the clinic with any problems, questions or concerns. No barriers to learning was detected. I spent 30 minutes counseling the patient face to face. The total time spent in the appointment was 40 minutes and more than 50% was on counseling and review of test results     [facility], [place], MD 09/19/2015 3:16  PM

## 2015-09-19 NOTE — Telephone Encounter (Signed)
per pof to sch pt appt-gave pt copy of avs °

## 2015-09-19 NOTE — Assessment & Plan Note (Addendum)
I have a long discussion with the patient and her daughters. Since recent hospitalization, her COPD appears to be under control. Her daughter is concerned because based on their recent visit to [facility], the patient was given overall survival in less than 3 months. I clarified that with the patient and her family. With the patient ongoing smoking habits, with recurrent COPD exacerbation, it can present dispose her to respiratory failure and death. Sometimes, the cause of death may not necessarily be related to multiple myeloma but rather from infection. If the patient could not tolerate further chemotherapy, her survival will be very short. However, if she continues to improve, she would be a candidate for aggressive treatment. Per recommendation from [place], we discussed the risks, benefits, side effects of Pomalidomide My plan would be to start her on Pomalidomide for at least a month and see how she tolerates that. If she feels well and her performance status continues to improve, the plan would be to add Daratumumab in the future. She will complete radiation treatment tomorrow. I recommend minimum 7-10 days break and plan to start her on Pomalidomide by 09/30/2015. I plan to see her back at the second week of December for further review, blood work and toxicity review.

## 2015-09-19 NOTE — Assessment & Plan Note (Signed)
She had recent COPD exacerbation. Examination today showed that her COPD is under control. She will continue a taper course of steroids and continue to use inhaler. Again, I recommend the patient to stop smoking

## 2015-09-19 NOTE — Patient Instructions (Signed)
Pomalidomide oral capsules What is this medicine? POMALIDOMIDE (pom a LID oh mide) is a chemotherapy drug used to treat multiple myeloma. It targets specific proteins within cancer cells and stops the cancer cell from growing. This medicine may be used for other purposes; ask your health care provider or pharmacist if you have questions. What should I tell my health care provider before I take this medicine? They need to know if you have any of these conditions: -high blood pressure -high cholesterol -history of blood clots -irregular monthly periods or menstrual cycles -kidney disease -liver disease -smoke tobacco -an unusual or allergic reaction to pomalidomide, other medicines, foods, dyes, or preservatives -pregnant or trying to get pregnant -breast-feeding How should I use this medicine? Take this medicine by mouth with a glass of water. Follow the directions on the prescription label. You can take it with or without food. If it upsets your stomach, take it with food. Do not cut, crush, or chew this medicine. Take your medicine at regular intervals. Do not take it more often than directed. Do not stop taking except on your doctor's advice. A special MedGuide will be given to you by the pharmacist with each prescription and refill. Be sure to read this information carefully each time. Talk to your pediatrician regarding the use of this medicine in children. Special care may be needed. Overdosage: If you think you have taken too much of this medicine contact a poison control center or emergency room at once. NOTE: This medicine is only for you. Do not share this medicine with others. What if I miss a dose? If you miss a dose, take it as soon as you can. If your next dose is to be taken in less than 12 hours, then do not take the missed dose. Take the next dose at your regular time. Do not take double or extra doses. What may interact with this medicine? This medicine may interact with the  following medications: -ciprofloxacin -fluvoxamine -tobacco (cigarettes) This list may not describe all possible interactions. Give your health care provider a list of all the medicines, herbs, non-prescription drugs, or dietary supplements you use. Also tell them if you smoke, drink alcohol, or use illegal drugs. Some items may interact with your medicine. What should I watch for while using this medicine? This drug may make you feel generally unwell. This is not uncommon, as chemotherapy can affect healthy cells as well as cancer cells. Report any side effects. Continue your course of treatment even though you feel ill unless your doctor tells you to stop. You may need blood work done while you are taking this medicine. This medicine is available only through a special program. Doctors, pharmacies, and patients must meet all of the conditions of the program. Your health care provider will help you get signed up with the program if you need this medicine. Through the program you will only receive up to a 28 day supply of the medicine at one time. You will need a new prescription for each refill. This medicine can cause birth defects. Do not get pregnant while taking this drug. Females with child-bearing potential will need to have 2 negative pregnancy tests before starting this medicine. Pregnancy testing must be done every 2 to 4 weeks as directed while taking this medicine. Use 2 reliable forms of birth control together while you are taking this medicine and for 4 weeks after you stop taking this medicine. If you think that you might be pregnant talk to  your doctor right away. Men must use a latex condom during sexual contact with a woman while taking this medicine and for 4 weeks after you stop taking this medicine. A latex condom is needed even if you have had a vasectomy. Contact your doctor right away if your partner becomes pregnant. Do not donate sperm while taking this medicine and for 4 weeks  after you stop taking this medicine. Do not give blood while taking the medicine and for 1 month after completion of treatment to avoid exposing pregnant women to the medicine through the donated blood. Talk to your doctor about your risk of cancer. You may be more at risk for certain types of cancers if you take this medicine. If you smoke, tell your doctor if you notice this medicine is not working well for you. Talk to your doctor if you are a smoker or if you decide to stop smoking. What side effects may I notice from receiving this medicine? Side effects that you should report to your doctor or health care professional as soon as possible: -allergic reactions like skin rash, itching or hives, swelling of the face, lips, or tongue -low blood counts - this medicine may decrease the number of white blood cells, red blood cells and platelets. You may be at increased risk for infections and bleeding -signs and symptoms of a blood clot such as breathing problems; changes in vision; chest pain; severe, sudden headache; pain, swelling, warmth in the leg; trouble speaking; sudden numbness or weakness of the face, arm or leg -signs and symptoms of liver injury like dark yellow or brown urine; general ill feeling or flu-like symptoms; light-colored stools; loss of appetite; nausea; right upper belly pain; unusually weak or tired; yellowing of the eyes or skin -signs and symptoms of a stroke like changes in vision; confusion; trouble speaking or understanding; severe headaches; sudden numbness or weakness of the face, arm or leg; trouble walking; dizziness; loss of balance or coordination -sweating -tingling, numbness in the hands or feet -unusual bleeding or bruising Side effects that usually do not require medical attention (Report these to your doctor or health care professional if they continue or are bothersome.): -back pain -constipation -diarrhea -nausea -tiredness This list may not describe all  possible side effects. Call your doctor for medical advice about side effects. You may report side effects to FDA at 1-800-FDA-1088. Where should I keep my medicine? Keep out of the reach of children. Store between 20 and 25 degrees C (68 and 77 degrees F). Throw away any unused medicine after the expiration date. NOTE: This sheet is a summary. It may not cover all possible information. If you have questions about this medicine, talk to your doctor, pharmacist, or health care provider.    2016, [employer]vier/[employer] Standard. (2015-05-09 16:23:15)

## 2015-09-20 ENCOUNTER — Ambulatory Visit
Admission: RE | Admit: 2015-09-20 | Discharge: 2015-09-20 | Disposition: A | Discharge status: Home / Self Care | Payer: Medicare Other | Source: Ambulatory Visit | Attending: Radiation Oncology | Admitting: Radiation Oncology

## 2015-09-20 ENCOUNTER — Telehealth: Payer: Self-pay | Admitting: Family Medicine

## 2015-09-20 ENCOUNTER — Encounter: Payer: Self-pay | Admitting: Radiation Oncology

## 2015-09-20 VITALS — BP 142/86 | HR 108 | Temp 97.9°F | Resp 20 | Wt 144.3 lb

## 2015-09-20 DIAGNOSIS — C9002 Multiple myeloma in relapse: Secondary | ICD-10-CM

## 2015-09-20 DIAGNOSIS — Z510 Encounter for antineoplastic radiation therapy: Secondary | ICD-10-CM | POA: Diagnosis not present

## 2015-09-20 NOTE — Telephone Encounter (Signed)
Transition Care Management Follow-up Telephone Call  How have you been since you were released from the hospital? good   Do you understand why you were in the hospital? yes   Do you understand the discharge instrcutions? yes  Items Reviewed:  Medications reviewed: yes  Allergies reviewed: yes  Dietary changes reviewed: yes  Referrals reviewed: yes   Functional Questionnaire:   Activities of Daily Living (ADLs):   She states they are independent in the following: feeding, continence, grooming, toileting, dressing and use of a walker States they require assistance with the following: none   Any transportation issues/concerns?: no   Any patient concerns? no   Confirmed importance and date/time of follow-up visits scheduled: yes   Confirmed with patient if condition begins to worsen call PCP or go to the ER.  Patient was given the Call-a-Nurse line [phone]: yes  Patient was discharged 09/17/15  Patient was discharged to her home  Patient has an appointment with Dr Yong Channel 09/24/15

## 2015-09-20 NOTE — Progress Notes (Signed)
Weekly rad txs L-Spine and Rt femur 10/10 completed, 1 month appt card 10/25/15 given, no pain stated took her pain meds this am, appetite great still  Taking decadron 4mg  pral daily, tongue is clear no thrush,  but dry,   In w/c, moon faced from decadron, fatigued but out of the hospital  9:42 AM BP 142/86 mmHg  Pulse 108  Temp(Src) 97.9 F (36.6 C) (Oral)  Resp 20  Wt 144 lb 4.8 oz (65.454 kg)  Wt Readings from Last 3 Encounters:  09/20/15 144 lb 4.8 oz (65.454 kg)  09/19/15 142 lb 14.4 oz (64.819 kg)  09/12/15 149 lb 4 oz (67.7 kg)

## 2015-09-20 NOTE — Progress Notes (Signed)
Department of Radiation Oncology  Phone:[phone] Fax: [phone]  Weekly Treatment Note   Name: Kendra Brumit PerryDate: 09/20/2015 MRN: [id number] DOB: 1951/08/15   Current dose: 25 Gy  Current fraction:10   MEDICATIONS: Current Outpatient Prescriptions  Medication Sig Dispense Refill  . ALPRAZolam (XANAX) 0.25 MG tablet Take 1 tablet (0.25 mg total) by mouth at bedtime as needed for anxiety. 30 tablet 0  . aspirin 81 MG tablet Take 81 mg by mouth daily.    . cefdinir (OMNICEF) 300 MG capsule Take 1 capsule (300 mg total) by mouth 2 (two) times daily. 2 capsule 0  . Cholecalciferol (VITAMIN D3) 5000 UNITS CAPS Take 5,000 Units by mouth daily.     . cyclobenzaprine (FLEXERIL) 10 MG tablet Take 1 tablet (10 mg total) by mouth 3 (three) times daily as needed for muscle spasms. 15 tablet 0  . dexamethasone (DECADRON) 4 MG tablet TAKE 1 TABLET (4 MG TOTAL) BY MOUTH DAILY. 30 tablet 1  . ipratropium (ATROVENT HFA) 17 MCG/ACT inhaler Inhale 2 puffs into the lungs every 4 (four) hours as needed for wheezing. 1 Inhaler 12  . levothyroxine (SYNTHROID) 50 MCG tablet Take 1 tablet (50 mcg total) by mouth daily before breakfast. 30 tablet 3  . magic mouthwash SOLN Take 10 mLs by mouth 4 (four) times daily as needed for mouth pain. Swish and spit Or Swish and swallow. 480 mL 2  . metoprolol (LOPRESSOR) 100 MG tablet Take 1 tablet (100 mg total) by mouth 2 (two) times daily.    Marland Kitchen morphine (MS CONTIN) 15 MG 12 hr tablet Take 5 tablets (75 mg total) by mouth every 8 (eight) hours. (Patient taking differently: Take 75 mg by mouth every 8 (eight) hours as needed for pain. ) 60 tablet 0  . morphine (MSIR) 30 MG tablet Take 1-1.5 tablets (30-45 mg total) by mouth every 2 (two) hours as needed for moderate pain or severe pain. (Patient taking differently: Take 30-45 mg by mouth every 2 (two)  hours as needed for moderate pain or severe pain. Take one tablet every 2 hours as needed for moderate pain) 30 tablet 0  . nicotine (NICODERM CQ - DOSED IN MG/24 HOURS) 14 mg/24hr patch Place 1 patch (14 mg total) onto the skin daily. 28 patch 0  . pomalidomide (POMALYST) 4 MG capsule Take 1 capsule (4 mg total) by mouth daily. Take with water on days 1-21. Repeat every 28 days. 21 capsule 0  . predniSONE (DELTASONE) 10 MG tablet Take 1 tablet (10 mg total) by mouth as directed. Take 40 mg (4 tablets by mouth) for the next 3 days, then take 30 mg (3 tablets by mouth) for the next 3 days, then take 20 mg (2 tablest by mouth) for the next 3 days, then take 10 mg (1 tablet) by mouth for the next 3 days. 35 tablet 0  . senna-docusate (SENOKOT-S) 8.6-50 MG tablet Take 1 tablet by mouth daily.    . [DISCONTINUED] amLODipine (NORVASC) 5 MG tablet Take 1 tablet (5 mg total) by mouth daily. 90 tablet 3  . [DISCONTINUED] gabapentin (NEURONTIN) 600 MG tablet Take 600 mg by mouth 3 (three) times daily.      No current facility-administered medications for this encounter.     ALLERGIES: Codeine; Ibuprofen; and Albuterol   LABORATORY DATA:   Recent Labs    Lab Results  Component Value Date   WBC 5.8 09/12/2015   HGB 11.2* 09/12/2015   HCT 35.5* 09/12/2015   MCV 99.2  09/12/2015   PLT 133* 09/12/2015      Recent Labs    Lab Results  Component Value Date   NA 141 09/11/2015   K 4.3 09/11/2015   CL 107 09/11/2015   CO2 27 09/11/2015      Recent Labs    Lab Results  Component Value Date   ALT 13 08/07/2015   AST 12 08/07/2015   ALKPHOS 100 08/07/2015   BILITOT 0.39 08/07/2015       NARRATIVE: Kendra Johns was seen today for weekly treatment management. The chart was checked and the patient's films were reviewed. Denies nausea and diarrhea. She is still taking the Decadron Dr. Alvy Bimler  put her on. She will see Dr. Alvy Bimler again in two weeks. She took her pain medication this morning. Her appetite is good. Her tongue is clear with no thrush, but dry.  Kendra Johns is here for her 10th and final treatment of radiation to her L1 L2 and Right Femur.   PHYSICAL EXAMINATION: vitals were not taken for this visit.  ASSESSMENT: The patient did satisfactorily with treatment.  PLAN: The patient will follow-up in our clinic in 1 month on 10/25/15.    This document serves as a record of services personally performed by Kyung Rudd, MD. It was created on his behalf by Lendon Collar, a trained medical scribe. The creation of this record is based on the scribe's personal observations and the provider's statements to them. This document has been checked and approved by the attending provider.

## 2015-09-21 ENCOUNTER — Other Ambulatory Visit: Payer: Self-pay | Admitting: Hematology and Oncology

## 2015-09-21 DIAGNOSIS — F17210 Nicotine dependence, cigarettes, uncomplicated: Secondary | ICD-10-CM | POA: Diagnosis not present

## 2015-09-21 DIAGNOSIS — M810 Age-related osteoporosis without current pathological fracture: Secondary | ICD-10-CM | POA: Diagnosis not present

## 2015-09-21 DIAGNOSIS — M84551D Pathological fracture in neoplastic disease, right femur, subsequent encounter for fracture with routine healing: Secondary | ICD-10-CM | POA: Diagnosis not present

## 2015-09-21 DIAGNOSIS — G893 Neoplasm related pain (acute) (chronic): Secondary | ICD-10-CM | POA: Diagnosis not present

## 2015-09-21 DIAGNOSIS — I10 Essential (primary) hypertension: Secondary | ICD-10-CM | POA: Diagnosis not present

## 2015-09-21 DIAGNOSIS — C9000 Multiple myeloma not having achieved remission: Secondary | ICD-10-CM | POA: Diagnosis not present

## 2015-09-23 ENCOUNTER — Telehealth: Payer: Self-pay | Admitting: Family Medicine

## 2015-09-23 ENCOUNTER — Telehealth: Payer: Self-pay | Admitting: Pharmacist

## 2015-09-23 ENCOUNTER — Telehealth: Payer: Self-pay | Admitting: *Deleted

## 2015-09-23 DIAGNOSIS — I10 Essential (primary) hypertension: Secondary | ICD-10-CM | POA: Diagnosis not present

## 2015-09-23 DIAGNOSIS — M84551D Pathological fracture in neoplastic disease, right femur, subsequent encounter for fracture with routine healing: Secondary | ICD-10-CM | POA: Diagnosis not present

## 2015-09-23 DIAGNOSIS — G893 Neoplasm related pain (acute) (chronic): Secondary | ICD-10-CM | POA: Diagnosis not present

## 2015-09-23 DIAGNOSIS — M810 Age-related osteoporosis without current pathological fracture: Secondary | ICD-10-CM | POA: Diagnosis not present

## 2015-09-23 DIAGNOSIS — C9000 Multiple myeloma not having achieved remission: Secondary | ICD-10-CM | POA: Diagnosis not present

## 2015-09-23 DIAGNOSIS — F17210 Nicotine dependence, cigarettes, uncomplicated: Secondary | ICD-10-CM | POA: Diagnosis not present

## 2015-09-23 NOTE — Telephone Encounter (Signed)
Kally from [employer].  She was just notifying us that the pomalyst required a prior authorization.  They will take care of this, but they just wanted to notify us.

## 2015-09-23 NOTE — Telephone Encounter (Signed)
[employer] Consent for Pomalyst done online w/ pt on phone.  New Rx for [employer] number [id number] given to Gerald Stabs, [employer].

## 2015-09-23 NOTE — Telephone Encounter (Signed)
Rx for Pomalyst faxed to [employer] (Fax #: [phone])

## 2015-09-23 NOTE — Telephone Encounter (Signed)
[place], [facility] called regarding continuing PT with verbal orders.  Verbal Order: Once a week for a week, twice a week for two weeks  Deidre would like to start PT this week. If you have any further question she can be reached at [phone]

## 2015-09-24 ENCOUNTER — Encounter: Payer: Self-pay | Admitting: Family Medicine

## 2015-09-24 ENCOUNTER — Telehealth: Payer: Self-pay | Admitting: Family Medicine

## 2015-09-24 ENCOUNTER — Ambulatory Visit (INDEPENDENT_AMBULATORY_CARE_PROVIDER_SITE_OTHER): Payer: Medicare Other | Admitting: Family Medicine

## 2015-09-24 VITALS — BP 150/80 | HR 74 | Temp 98.1°F

## 2015-09-24 DIAGNOSIS — E038 Other specified hypothyroidism: Secondary | ICD-10-CM

## 2015-09-24 DIAGNOSIS — J441 Chronic obstructive pulmonary disease with (acute) exacerbation: Secondary | ICD-10-CM | POA: Diagnosis not present

## 2015-09-24 DIAGNOSIS — E034 Atrophy of thyroid (acquired): Secondary | ICD-10-CM

## 2015-09-24 DIAGNOSIS — I10 Essential (primary) hypertension: Secondary | ICD-10-CM

## 2015-09-24 MED ORDER — LOSARTAN POTASSIUM 50 MG PO TABS: 50.0000 mg | ORAL_TABLET | Freq: Every day | ORAL | Status: DC

## 2015-09-24 NOTE — Assessment & Plan Note (Signed)
S: poorly controlled. Appears patient was started on metoprolol in hospital on 08/26/15 but unclear if she was ever given rx outside of hospital. Losartan was stopped by nursing home  Physician on 08/29/15 for unclear reason BP Readings from Last 3 Encounters:  09/24/15 150/80  09/20/15 142/86  09/19/15 155/100  A/P: reastart losartan at 50mg  and follow up within a month. Advised to bring all medicines. Not clear if really on metoprolol and this would not really be ideal if she does in fact have COPE as thought

## 2015-09-24 NOTE — Progress Notes (Signed)
[name], MD  Subjective:  Kendra Johns is a 64 y.o. year old very pleasant female patient who presents for for transitional care management and hospital follow up for COPD exacerbation. Patient was hospitalized from 09/11/15 to 09/17/15. A TCM phone call was completed on 09/19/15 but patient was unavailable. Follow up phone call on 09/20/15 completed TCM call.  Medical complexity moderate.  Patient with multiple myeloma with bone marrow transplant in 2009 and 2013 with 3rd recurrence recently who had recently been admitted for pathologic fracture with 2nd fracture even after nailing who presented to [facility] with dyspnea and cough and found to be COPD exacerbation. Patient was started on steroids and sent home with steroid taper. She completed a course at home of omnicef. She states her symptoms have essentially resolved. She is still finishing steroid taper  Also See problem oriented charting . ROS- no chest pain or shortness of breath, continued severe leg pain managed by pain management. Does have edema since fracture which as forced more sedentary actvity.   Past Medical History-  Patient Active Problem List   Diagnosis Date Noted  . COPD exacerbation ([place]) 09/12/2015    Priority: High  . Right ischial fracture ([place]) 09/06/2015    Priority: High  . 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 Tobacco abuse 08/22/2015    Priority: High  . Status post bone marrow transplant ([place]) 08/21/2009    Priority: High  . Multiple myeloma in relapse ([place]) 09/14/2007    Priority: High  . Hypothyroidism 08/21/2015    Priority: Medium  . Thrombocytopenia due to drugs 10/10/2014    Priority: Medium  . 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 Hyperlipidemia 02/13/2011    Priority: Medium  . Essential hypertension 03/11/2010    Priority: Medium  . Chronic hepatitis C ([place]) 08/31/2006    Priority: Medium  .  Vitamin D deficiency 05/28/2015    Priority: Low  . Chronic fatigue 03/07/2015    Priority: Low  . Poor dentition 01/23/2015    Priority: Low  . Gastritis 08/30/2014    Priority: Low  . Bilateral low back pain without sciatica 03/17/2011    Priority: Low  . History of adenomatous colon polyps 08/31/2006    Priority: Low  . Anemia 09/05/2015    Medications- reviewed and updated Current Outpatient Prescriptions  Medication Sig Dispense Refill  . ALPRAZolam (XANAX) 0.25 MG tablet Take 1 tablet (0.25 mg total) by mouth at bedtime as needed for anxiety. 30 tablet 0  . aspirin 81 MG tablet Take 81 mg by mouth daily.    . Cholecalciferol (VITAMIN D3) 5000 UNITS CAPS Take 5,000 Units by mouth daily.     . cyclobenzaprine (FLEXERIL) 10 MG tablet Take 1 tablet (10 mg total) by mouth 3 (three) times daily as needed for muscle spasms. 15 tablet 0  . dexamethasone (DECADRON) 4 MG tablet TAKE 1 TABLET (4 MG TOTAL) BY MOUTH DAILY. 30 tablet 1  . ipratropium (ATROVENT HFA) 17 MCG/ACT inhaler Inhale 2 puffs into the lungs every 4 (four) hours as needed for wheezing. 1 Inhaler 12  . levothyroxine (SYNTHROID, LEVOTHROID) 50 MCG tablet TAKE 1 TABLET (50 MCG TOTAL) BY MOUTH DAILY BEFORE BREAKFAST. 30 tablet 3  . losartan (COZAAR) 50 MG tablet Take 1 tablet (50 mg total) by mouth daily. 30 tablet 3  . magic mouthwash SOLN Take 10 mLs by mouth 4 (four)  times daily as needed for mouth pain. Swish and spit  Or  Swish and swallow. 480 mL 2  . metoprolol (LOPRESSOR) 100 MG tablet Take 1 tablet (100 mg total) by mouth 2 (two) times daily.    Marland Kitchen morphine (MS CONTIN) 15 MG 12 hr tablet Take 5 tablets (75 mg total) by mouth every 8 (eight) hours. (Patient taking differently: Take 75 mg by mouth every 8 (eight) hours as needed for pain. ) 60 tablet 0  . morphine (MSIR) 30 MG tablet Take 1-1.5 tablets (30-45 mg total) by mouth every 2 (two) hours as needed for moderate pain or severe pain. (Patient taking differently:  Take 30-45 mg by mouth every 2 (two) hours as needed for moderate pain or severe pain. Take one tablet every 2 hours as needed for moderate pain) 30 tablet 0  . nicotine (NICODERM CQ - DOSED IN MG/24 HOURS) 14 mg/24hr patch Place 1 patch (14 mg total) onto the skin daily. 28 patch 0  . pomalidomide (POMALYST) 4 MG capsule Take 1 capsule (4 mg total) by mouth daily. Take with water on days 1-21. Repeat every 28 days. 21 capsule 0  . predniSONE (DELTASONE) 10 MG tablet Take 1 tablet (10 mg total) by mouth as directed. Take 40 mg (4 tablets by mouth) for the next 3 days, then take 30 mg (3 tablets by mouth) for the next 3 days, then take 20 mg (2 tablest by mouth) for the next 3 days, then take 10 mg (1 tablet) by mouth for the next 3 days. 35 tablet 0  . senna-docusate (SENOKOT-S) 8.6-50 MG tablet Take 1 tablet by mouth daily.     Objective: BP 150/80 mmHg  Pulse 74  Temp(Src) 98.1 F (36.7 C) Gen: NAD, resting comfortably CV: RRR no murmurs rubs or gallops Lungs: CTAB no crackles, wheeze, rhonchi Abdomen: soft/nontender/nondistended/normal bowel sounds. No rebound or guarding.  Ext: 1+ edema Skin: warm, dry Neuro: grossly normal, moves all extremities, walks with walker   Assessment/Plan:  COPD exacerbation ([place]) New diagnosis 2016. Has seen Dr. Melvyn Novas in 2015- never went through with PFTs as he suggested. COPD exacerbation has resolved. Finishing steroid course currently but done with omnicef. This is a new diagnosis 2016 but patient is known long term smoker. Currently using patches and encouraged continued complete cessation.   Hypothyroidism S: apparently discovered by oncology. Patient compliant with levothyroxine 22mg and controlled Lab Results  Component Value Date   TSH 3.801 09/05/2015  A/P: continue current rx   Essential hypertension S: poorly controlled. Appears patient was started on metoprolol in hospital on 08/26/15 but unclear if she was ever given rx outside of  hospital. Losartan was stopped by nursing home  Physician on 08/29/15 for unclear reason BP Readings from Last 3 Encounters:  09/24/15 150/80  09/20/15 142/86  09/19/15 155/100  A/P: reastart losartan at 510mand follow up within a month. Advised to bring all medicines. Not clear if really on metoprolol and this would not really be ideal if she does in fact have COPE as thought   1 month f/u BP Return precautions advised.   Meds ordered this encounter  Medications  . losartan (COZAAR) 50 MG tablet    Sig: Take 1 tablet (50 mg total) by mouth daily.    Dispense:  30 tablet    Refill:  3

## 2015-09-24 NOTE — Telephone Encounter (Signed)
Lm with VO on [place] vm.

## 2015-09-24 NOTE — Assessment & Plan Note (Addendum)
New diagnosis 2016. Has seen Dr. Melvyn Novas in 2015- never went through with PFTs as he suggested. COPD exacerbation has resolved. Finishing steroid course currently but done with omnicef. This is a new diagnosis 2016 but patient is known long term smoker. Currently using patches and encouraged continued complete cessation.

## 2015-09-24 NOTE — Telephone Encounter (Signed)
Santiago Glad w/[facility] called for a verbal order for home health occupational therapy to improve patient's endurance, shower transfer training, and safety with self care management.

## 2015-09-24 NOTE — Patient Instructions (Signed)
COPD exacerbation has resolved but I would finish prednisone still  Restart losartan 50mg  daily  Check in with me within a month so we can recheck blood pressure  No other changes

## 2015-09-24 NOTE — Assessment & Plan Note (Signed)
S: apparently discovered by oncology. Patient compliant with levothyroxine 79mcg and controlled Lab Results  Component Value Date   TSH 3.801 09/05/2015  A/P: continue current rx

## 2015-09-25 DIAGNOSIS — F17210 Nicotine dependence, cigarettes, uncomplicated: Secondary | ICD-10-CM | POA: Diagnosis not present

## 2015-09-25 DIAGNOSIS — G893 Neoplasm related pain (acute) (chronic): Secondary | ICD-10-CM | POA: Diagnosis not present

## 2015-09-25 DIAGNOSIS — I10 Essential (primary) hypertension: Secondary | ICD-10-CM | POA: Diagnosis not present

## 2015-09-25 DIAGNOSIS — M810 Age-related osteoporosis without current pathological fracture: Secondary | ICD-10-CM | POA: Diagnosis not present

## 2015-09-25 DIAGNOSIS — C9000 Multiple myeloma not having achieved remission: Secondary | ICD-10-CM | POA: Diagnosis not present

## 2015-09-25 DIAGNOSIS — M84551D Pathological fracture in neoplastic disease, right femur, subsequent encounter for fracture with routine healing: Secondary | ICD-10-CM | POA: Diagnosis not present

## 2015-09-25 NOTE — Telephone Encounter (Signed)
Called Santiago Glad back and provided VO.

## 2015-10-01 ENCOUNTER — Encounter: Payer: Self-pay | Admitting: Hematology and Oncology

## 2015-10-01 DIAGNOSIS — M810 Age-related osteoporosis without current pathological fracture: Secondary | ICD-10-CM | POA: Diagnosis not present

## 2015-10-01 DIAGNOSIS — G893 Neoplasm related pain (acute) (chronic): Secondary | ICD-10-CM | POA: Diagnosis not present

## 2015-10-01 DIAGNOSIS — C9000 Multiple myeloma not having achieved remission: Secondary | ICD-10-CM | POA: Diagnosis not present

## 2015-10-01 DIAGNOSIS — M84551D Pathological fracture in neoplastic disease, right femur, subsequent encounter for fracture with routine healing: Secondary | ICD-10-CM | POA: Diagnosis not present

## 2015-10-01 DIAGNOSIS — I10 Essential (primary) hypertension: Secondary | ICD-10-CM | POA: Diagnosis not present

## 2015-10-01 DIAGNOSIS — F17210 Nicotine dependence, cigarettes, uncomplicated: Secondary | ICD-10-CM | POA: Diagnosis not present

## 2015-10-01 NOTE — Progress Notes (Signed)
Received letter from [employer]. Pt is approved for $10,000 from 08/25/15 to 08/23/16.  Sent copy of approval letter and reimbursement request form to Arline Asp in the billing dept.

## 2015-10-02 DIAGNOSIS — Q141 Congenital malformation of retina: Secondary | ICD-10-CM | POA: Diagnosis not present

## 2015-10-03 DIAGNOSIS — M84551D Pathological fracture in neoplastic disease, right femur, subsequent encounter for fracture with routine healing: Secondary | ICD-10-CM | POA: Diagnosis not present

## 2015-10-03 DIAGNOSIS — G893 Neoplasm related pain (acute) (chronic): Secondary | ICD-10-CM | POA: Diagnosis not present

## 2015-10-03 DIAGNOSIS — C9000 Multiple myeloma not having achieved remission: Secondary | ICD-10-CM | POA: Diagnosis not present

## 2015-10-03 DIAGNOSIS — I10 Essential (primary) hypertension: Secondary | ICD-10-CM | POA: Diagnosis not present

## 2015-10-03 DIAGNOSIS — M810 Age-related osteoporosis without current pathological fracture: Secondary | ICD-10-CM | POA: Diagnosis not present

## 2015-10-03 DIAGNOSIS — F17210 Nicotine dependence, cigarettes, uncomplicated: Secondary | ICD-10-CM | POA: Diagnosis not present

## 2015-10-04 DIAGNOSIS — G893 Neoplasm related pain (acute) (chronic): Secondary | ICD-10-CM | POA: Diagnosis not present

## 2015-10-04 DIAGNOSIS — C9000 Multiple myeloma not having achieved remission: Secondary | ICD-10-CM | POA: Diagnosis not present

## 2015-10-04 DIAGNOSIS — I10 Essential (primary) hypertension: Secondary | ICD-10-CM | POA: Diagnosis not present

## 2015-10-04 DIAGNOSIS — M810 Age-related osteoporosis without current pathological fracture: Secondary | ICD-10-CM | POA: Diagnosis not present

## 2015-10-04 DIAGNOSIS — F17210 Nicotine dependence, cigarettes, uncomplicated: Secondary | ICD-10-CM | POA: Diagnosis not present

## 2015-10-04 DIAGNOSIS — M84551D Pathological fracture in neoplastic disease, right femur, subsequent encounter for fracture with routine healing: Secondary | ICD-10-CM | POA: Diagnosis not present

## 2015-10-07 DIAGNOSIS — C9000 Multiple myeloma not having achieved remission: Secondary | ICD-10-CM | POA: Diagnosis not present

## 2015-10-07 DIAGNOSIS — G893 Neoplasm related pain (acute) (chronic): Secondary | ICD-10-CM | POA: Diagnosis not present

## 2015-10-07 DIAGNOSIS — G894 Chronic pain syndrome: Secondary | ICD-10-CM | POA: Diagnosis not present

## 2015-10-07 DIAGNOSIS — M5431 Sciatica, right side: Secondary | ICD-10-CM | POA: Diagnosis not present

## 2015-10-07 DIAGNOSIS — M810 Age-related osteoporosis without current pathological fracture: Secondary | ICD-10-CM | POA: Diagnosis not present

## 2015-10-07 DIAGNOSIS — I10 Essential (primary) hypertension: Secondary | ICD-10-CM | POA: Diagnosis not present

## 2015-10-07 DIAGNOSIS — F17210 Nicotine dependence, cigarettes, uncomplicated: Secondary | ICD-10-CM | POA: Diagnosis not present

## 2015-10-07 DIAGNOSIS — Z79891 Long term (current) use of opiate analgesic: Secondary | ICD-10-CM | POA: Diagnosis not present

## 2015-10-07 DIAGNOSIS — M84551D Pathological fracture in neoplastic disease, right femur, subsequent encounter for fracture with routine healing: Secondary | ICD-10-CM | POA: Diagnosis not present

## 2015-10-07 DIAGNOSIS — K5900 Constipation, unspecified: Secondary | ICD-10-CM | POA: Diagnosis not present

## 2015-10-08 ENCOUNTER — Ambulatory Visit (HOSPITAL_BASED_OUTPATIENT_CLINIC_OR_DEPARTMENT_OTHER): Payer: Medicare Other | Admitting: Hematology and Oncology

## 2015-10-08 ENCOUNTER — Other Ambulatory Visit (HOSPITAL_BASED_OUTPATIENT_CLINIC_OR_DEPARTMENT_OTHER): Payer: Medicare Other

## 2015-10-08 ENCOUNTER — Telehealth: Payer: Self-pay | Admitting: *Deleted

## 2015-10-08 ENCOUNTER — Telehealth: Payer: Self-pay | Admitting: Hematology and Oncology

## 2015-10-08 ENCOUNTER — Encounter: Payer: Self-pay | Admitting: Hematology and Oncology

## 2015-10-08 VITALS — BP 172/143 | HR 87 | Temp 98.6°F | Resp 18 | Ht 64.0 in | Wt 147.3 lb

## 2015-10-08 DIAGNOSIS — C9000 Multiple myeloma not having achieved remission: Secondary | ICD-10-CM | POA: Diagnosis not present

## 2015-10-08 DIAGNOSIS — D696 Thrombocytopenia, unspecified: Secondary | ICD-10-CM

## 2015-10-08 DIAGNOSIS — S32601D Unspecified fracture of right ischium, subsequent encounter for fracture with routine healing: Secondary | ICD-10-CM

## 2015-10-08 DIAGNOSIS — I10 Essential (primary) hypertension: Secondary | ICD-10-CM

## 2015-10-08 DIAGNOSIS — R300 Dysuria: Secondary | ICD-10-CM

## 2015-10-08 DIAGNOSIS — C9002 Multiple myeloma in relapse: Secondary | ICD-10-CM

## 2015-10-08 DIAGNOSIS — Z720 Tobacco use: Secondary | ICD-10-CM

## 2015-10-08 HISTORY — DX: Dysuria: R30.0

## 2015-10-08 LAB — COMPREHENSIVE METABOLIC PANEL
ALT: 16 U/L (ref 0–55)
AST: 12 U/L (ref 5–34)
Albumin: 3.6 g/dL (ref 3.5–5.0)
Alkaline Phosphatase: 85 U/L (ref 40–150)
Anion Gap: 11 mEq/L (ref 3–11)
BILIRUBIN TOTAL: 0.61 mg/dL (ref 0.20–1.20)
BUN: 18.4 mg/dL (ref 7.0–26.0)
CO2: 22 meq/L (ref 22–29)
Calcium: 9.4 mg/dL (ref 8.4–10.4)
Chloride: 109 mEq/L (ref 98–109)
Creatinine: 0.7 mg/dL (ref 0.6–1.1)
GLUCOSE: 138 mg/dL (ref 70–140)
POTASSIUM: 3.8 meq/L (ref 3.5–5.1)
SODIUM: 142 meq/L (ref 136–145)
TOTAL PROTEIN: 6.4 g/dL (ref 6.4–8.3)

## 2015-10-08 LAB — URINALYSIS, MICROSCOPIC - CHCC
BILIRUBIN (URINE): NEGATIVE
Glucose: NEGATIVE mg/dL
Ketones: NEGATIVE mg/dL
NITRITE: POSITIVE
Protein: 100 mg/dL
Specific Gravity, Urine: 1.03 (ref 1.003–1.035)
Urobilinogen, UR: 0.2 mg/dL (ref 0.2–1)
pH: 6 (ref 4.6–8.0)

## 2015-10-08 LAB — CBC WITH DIFFERENTIAL/PLATELET
BASO%: 0.4 % (ref 0.0–2.0)
BASOS ABS: 0 10*3/uL (ref 0.0–0.1)
EOS ABS: 0.1 10*3/uL (ref 0.0–0.5)
EOS%: 0.7 % (ref 0.0–7.0)
HCT: 39.2 % (ref 34.8–46.6)
HGB: 12.5 g/dL (ref 11.6–15.9)
LYMPH#: 0.4 10*3/uL — AB (ref 0.9–3.3)
LYMPH%: 5.2 % — ABNORMAL LOW (ref 14.0–49.7)
MCH: 32 pg (ref 25.1–34.0)
MCHC: 31.9 g/dL (ref 31.5–36.0)
MCV: 100.2 fL (ref 79.5–101.0)
MONO#: 0.9 10*3/uL (ref 0.1–0.9)
MONO%: 11.4 % (ref 0.0–14.0)
NEUT%: 82.3 % — ABNORMAL HIGH (ref 38.4–76.8)
NEUTROS ABS: 6.2 10*3/uL (ref 1.5–6.5)
Platelets: 71 10*3/uL — ABNORMAL LOW (ref 145–400)
RBC: 3.91 10*6/uL (ref 3.70–5.45)
RDW: 18.1 % — AB (ref 11.2–14.5)
WBC: 7.5 10*3/uL (ref 3.9–10.3)

## 2015-10-08 LAB — TECHNOLOGIST REVIEW

## 2015-10-08 MED ORDER — AMOXICILLIN 500 MG PO TABS: 500.0000 mg | ORAL_TABLET | Freq: Three times a day (TID) | ORAL | Status: DC

## 2015-10-08 NOTE — Assessment & Plan Note (Signed)
Her blood pressure is grossly elevated which is suspect was exacerbated by back pain. I would not adjust her medication for now until her pain appears to be under control. 

## 2015-10-08 NOTE — Assessment & Plan Note (Signed)
I spent some time counseling the patient the importance of tobacco cessation. she is currently attempting to quit on her own 

## 2015-10-08 NOTE — Assessment & Plan Note (Signed)
The patient is prone to have recurrent infection. I have ordered urinalysis which came back grossly abnormal. I will proceed with urine culture and prescribed a course of oral amoxicillin

## 2015-10-08 NOTE — Telephone Encounter (Signed)
Informed pt of U/A results look like UTI and Dr. Alvy Bimler wants pt to start Antibiotic.  Instructed pt to pick up Amoxicillin and start tonight,  Three times daily for 7 days,  Until gone.  She verbalized understanding.

## 2015-10-08 NOTE — Assessment & Plan Note (Signed)
The patient had multiple bone fractures status post radiation. She continues to have chronic pain, but currently well-controlled with morphine sulfate. She will continue current pain medication regimen. I warned her about potential risks of increased bone pain with dexamethasone taper as outlined above

## 2015-10-08 NOTE — Assessment & Plan Note (Signed)
The patient tolerated Pomalyst well without major side effects aside from thrombocytopenia. I recommend we continue treatment for at least a month. In the meantime, I would initiate dexamethasone taper to 2 mg daily starting tomorrow. Starting 10/19/2015, we will change it to every other day at 2 mg and then subsequently stop after Christmas. If she tolerated treatment well, I will consider adding Daratumumab along with Pomalyst in the future

## 2015-10-08 NOTE — Telephone Encounter (Signed)
-----   Message from Heath Lark, MD sent at 10/08/2015  1:19 PM EST ----- Regarding: UTI Please call in amoxicillin 500 mg TID PO X 7 days no refills ----- Message -----    From: [facility] Interface    Sent: 10/08/2015   1:13 PM      To: Heath Lark, MD

## 2015-10-08 NOTE — Progress Notes (Signed)
[place] OFFICE PROGRESS NOTE  Patient Care Team: Marin Olp, MD as PCP - General (Family Medicine) Jeanann Lewandowsky, MD as Consulting Physician (Medical Oncology) Marin Olp, MD as Consulting Physician (Family Medicine) Heath Lark, MD as Consulting Physician (Hematology and Oncology)  SUMMARY OF ONCOLOGIC HISTORY: Oncology History   Multiple myeloma, kappa light chain disease, Durie-Salmon stage III     Multiple myeloma in relapse ([place])   07/16/2006 Bone Marrow Biopsy BM biopsy is non-diagnostic   09/21/2006 Procedure L5 vertebral biopsy 5% plasma cell   10/25/2006 Bone Marrow Biopsy BM biopsy is hypercellular with 5% plasma cell   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   01/03/2007 - 01/10/2007 Radiation Therapy Approximate date only, received RT for plasmacytoma followed by surgery   09/14/2007 Initial Diagnosis MULTIPLE  MYELOMA   10/05/2007 Bone Marrow Biopsy Bm biopsy was negative   06/18/2008 Bone Marrow Biopsy BM biopsy was negative   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 [facility]   04/13/2011 Relapse/Recurrence Disease relapse   04/14/2011 Bone Marrow Biopsy Bm biopsy showed 2 % plasma cell   04/27/2011 Relapse/Recurrence Disease relapse, treated with Velcade/Cytoxan/Dex   09/07/2011 - 07/28/2013 Chemotherapy She has been receiving Velcade   12/27/2011 Bone Marrow Transplant 2nd transplant at [facility]   07/28/2013 Relapse/Recurrence Chemo is stopped due to progression of disease   08/29/2013 Imaging PEt/CT showed recurrence of disease with new lesion on her rib with compression fracture   09/13/2013 Bone Marrow Biopsy BM biopsy is hypercellular with 6% plasma cell   10/10/2013 - 10/20/2013 Radiation Therapy Started on palliative XRT for rib pain   11/27/2013 - 04/06/2014 Chemotherapy The patient starts chemotherapy with Carfilzomib   07/19/2014 Imaging Repeat bloodwork and PET/CT scan shows significant disease progression.   08/02/2014  - 03/06/2015 Chemotherapy She enrolled in clinical trial using combination therapy with Revlimid, dexamethasone and Elotuzumab   03/07/2015 - 06/04/2015 Chemotherapy She is on maintenance Revlimid only without dexamethasone   05/27/2015 Imaging  MRI spine showed new compression fracture.   06/04/2015 - 06/18/2015 Radiation Therapy  she received palliative radiation therapy to 25 Gy C7-T1   07/10/2015 - 08/07/2015 Chemotherapy She received Elotuzumab, dex and Revlimid. Rx is stopped due to progression   08/01/2015 Imaging MRI of the spine was performed today due to new onset of worsening right hip pain/flank area. MRI show mild progression of the L1 vertebral body.   08/21/2015 - 08/26/2015 Hospital Admission She was admitted to the hospital due to pathologic fracture to right femur   08/22/2015 Surgery She had IM nail to fractured femur   08/23/2015 - 09/20/2015 Radiation Therapy She received XRT to her L1 L2 and Right Femur   09/05/2015 - 09/06/2015 Hospital Admission She had recurrent admission due to displaced fracture, managed conservatively   09/12/2015 - 09/17/2015 Hospital Admission She had recurrent admission for COPD exacerbation   09/30/2015 -  Chemotherapy She was started on Pomalyst    INTERVAL HISTORY: Please see below for problem oriented charting. She returns for further follow-up. Since the last time I saw her, she feels a bit better. Her pain is rated at 7 out of 10 but currently manageable. She denies recent cough or shortness of breath. She is attempting to quit smoking but continue to smoke about 4 cigarettes per day. She started taking Pomalyst recently without any side effects. She complains of occasional dysuria and urinary frequency. Denies fevers or chills.  REVIEW OF SYSTEMS:   Constitutional: Denies fevers, chills or  abnormal weight loss Eyes: Denies blurriness of vision Ears, nose, mouth, throat, and face: Denies mucositis or sore throat Respiratory: Denies cough, dyspnea or  wheezes Cardiovascular: Denies palpitation, chest discomfort or lower extremity swelling Gastrointestinal:  Denies nausea, heartburn or change in bowel habits Skin: Denies abnormal skin rashes Lymphatics: Denies new lymphadenopathy or easy bruising Neurological:Denies numbness, tingling or new weaknesses Behavioral/Psych: Mood is stable, no new changes  All other systems were reviewed with the patient and are negative.  I have reviewed the past medical history, past surgical history, social history and family history with the patient and they are unchanged from previous note.  ALLERGIES:  is allergic to codeine; ibuprofen; and albuterol.  MEDICATIONS:  Current Outpatient Prescriptions  Medication Sig Dispense Refill  . ALPRAZolam (XANAX) 0.25 MG tablet Take 1 tablet (0.25 mg total) by mouth at bedtime as needed for anxiety. 30 tablet 0  . amoxicillin (AMOXIL) 500 MG tablet Take 1 tablet (500 mg total) by mouth 3 (three) times daily. 21 tablet 0  . aspirin 81 MG tablet Take 81 mg by mouth daily.    . Cholecalciferol (VITAMIN D3) 5000 UNITS CAPS Take 5,000 Units by mouth daily.     . cyclobenzaprine (FLEXERIL) 10 MG tablet Take 1 tablet (10 mg total) by mouth 3 (three) times daily as needed for muscle spasms. 15 tablet 0  . dexamethasone (DECADRON) 4 MG tablet TAKE 1 TABLET (4 MG TOTAL) BY MOUTH DAILY. 30 tablet 1  . ipratropium (ATROVENT HFA) 17 MCG/ACT inhaler Inhale 2 puffs into the lungs every 4 (four) hours as needed for wheezing. 1 Inhaler 12  . levothyroxine (SYNTHROID, LEVOTHROID) 50 MCG tablet TAKE 1 TABLET (50 MCG TOTAL) BY MOUTH DAILY BEFORE BREAKFAST. 30 tablet 3  . losartan (COZAAR) 50 MG tablet Take 1 tablet (50 mg total) by mouth daily. 30 tablet 3  . magic mouthwash SOLN Take 10 mLs by mouth 4 (four) times daily as needed for mouth pain. Swish and spit  Or  Swish and swallow. 480 mL 2  . metoprolol (LOPRESSOR) 100 MG tablet Take 1 tablet (100 mg total) by mouth 2 (two) times  daily.    Marland Kitchen morphine (MS CONTIN) 15 MG 12 hr tablet Take 5 tablets (75 mg total) by mouth every 8 (eight) hours. (Patient taking differently: Take 75 mg by mouth every 8 (eight) hours as needed for pain. ) 60 tablet 0  . morphine (MSIR) 30 MG tablet Take 1-1.5 tablets (30-45 mg total) by mouth every 2 (two) hours as needed for moderate pain or severe pain. (Patient taking differently: Take 30-45 mg by mouth every 2 (two) hours as needed for moderate pain or severe pain. Take one tablet every 2 hours as needed for moderate pain) 30 tablet 0  . nicotine (NICODERM CQ - DOSED IN MG/24 HOURS) 14 mg/24hr patch Place 1 patch (14 mg total) onto the skin daily. 28 patch 0  . pomalidomide (POMALYST) 4 MG capsule Take 1 capsule (4 mg total) by mouth daily. Take with water on days 1-21. Repeat every 28 days. 21 capsule 0  . predniSONE (DELTASONE) 10 MG tablet Take 1 tablet (10 mg total) by mouth as directed. Take 40 mg (4 tablets by mouth) for the next 3 days, then take 30 mg (3 tablets by mouth) for the next 3 days, then take 20 mg (2 tablest by mouth) for the next 3 days, then take 10 mg (1 tablet) by mouth for the next 3 days. 35 tablet 0  .  senna-docusate (SENOKOT-S) 8.6-50 MG tablet Take 1 tablet by mouth daily.    . [DISCONTINUED] amLODipine (NORVASC) 5 MG tablet Take 1 tablet (5 mg total) by mouth daily. 90 tablet 3  . [DISCONTINUED] gabapentin (NEURONTIN) 600 MG tablet Take 600 mg by mouth 3 (three) times daily.       No current facility-administered medications for this visit.    PHYSICAL EXAMINATION: ECOG PERFORMANCE STATUS: 2 - Symptomatic, <50% confined to bed  Filed Vitals:   10/08/15 1158 10/08/15 1159  BP: 170/108 172/143  Pulse: 87   Temp: 98.6 F (37 C)   Resp: 18    Filed Weights   10/08/15 1158  Weight: 147 lb 4.8 oz (66.815 kg)    GENERAL:alert, no distress and comfortable SKIN: skin color, texture, turgor are normal, no rashes or significant lesions EYES: normal, Conjunctiva  are pink and non-injected, sclera clear OROPHARYNX:no exudate, no erythema and lips, buccal mucosa, and tongue normal  NECK: supple, thyroid normal size, non-tender, without nodularity LYMPH:  no palpable lymphadenopathy in the cervical, axillary or inguinal LUNGS: clear to auscultation and percussion with normal breathing effort HEART: regular rate & rhythm and no murmurs and no lower extremity edema ABDOMEN:abdomen soft, non-tender and normal bowel sounds Musculoskeletal:no cyanosis of digits and no clubbing  NEURO: alert & oriented x 3 with fluent speech, no focal motor/sensory deficits  LABORATORY DATA:  I have reviewed the data as listed    Component Value Date/Time   NA 142 10/08/2015 1132   NA 141 09/11/2015 2255   K 3.8 10/08/2015 1132   K 4.3 09/11/2015 2255   CL 107 09/11/2015 2255   CL 107 03/10/2013 1014   CO2 22 10/08/2015 1132   CO2 27 09/11/2015 2255   GLUCOSE 138 10/08/2015 1132   GLUCOSE 106* 09/11/2015 2255   GLUCOSE 111* 03/10/2013 1014   GLUCOSE 98 08/31/2006 1024   BUN 18.4 10/08/2015 1132   BUN 16 09/11/2015 2255   CREATININE 0.7 10/08/2015 1132   CREATININE 0.72 09/12/2015 0620   CALCIUM 9.4 10/08/2015 1132   CALCIUM 9.1 09/11/2015 2255   PROT 6.4 10/08/2015 1132   PROT 6.6 09/11/2014 0336   ALBUMIN 3.6 10/08/2015 1132   ALBUMIN 3.4* 09/11/2014 0336   AST 12 10/08/2015 1132   AST 18 09/11/2014 0336   ALT 16 10/08/2015 1132   ALT 18 09/11/2014 0336   ALKPHOS 85 10/08/2015 1132   ALKPHOS 113 09/11/2014 0336   BILITOT 0.61 10/08/2015 1132   BILITOT 0.6 09/11/2014 0336   GFRNONAA >60 09/12/2015 0620   GFRAA >60 09/12/2015 0620    No results found for: SPEP, UPEP  Lab Results  Component Value Date   WBC 7.5 10/08/2015   NEUTROABS 6.2 10/08/2015   HGB 12.5 10/08/2015   HCT 39.2 10/08/2015   MCV 100.2 10/08/2015   PLT 71* 10/08/2015      Chemistry      Component Value Date/Time   NA 142 10/08/2015 1132   NA 141 09/11/2015 2255   K 3.8  10/08/2015 1132   K 4.3 09/11/2015 2255   CL 107 09/11/2015 2255   CL 107 03/10/2013 1014   CO2 22 10/08/2015 1132   CO2 27 09/11/2015 2255   BUN 18.4 10/08/2015 1132   BUN 16 09/11/2015 2255   CREATININE 0.7 10/08/2015 1132   CREATININE 0.72 09/12/2015 0620      Component Value Date/Time   CALCIUM 9.4 10/08/2015 1132   CALCIUM 9.1 09/11/2015 2255   ALKPHOS 85  10/08/2015 1132   ALKPHOS 113 09/11/2014 0336   AST 12 10/08/2015 1132   AST 18 09/11/2014 0336   ALT 16 10/08/2015 1132   ALT 18 09/11/2014 0336   BILITOT 0.61 10/08/2015 1132   BILITOT 0.6 09/11/2014 0336      ASSESSMENT & PLAN:  Multiple myeloma in relapse ([place]) The patient tolerated Pomalyst well without major side effects aside from thrombocytopenia. I recommend we continue treatment for at least a month. In the meantime, I would initiate dexamethasone taper to 2 mg daily starting tomorrow. Starting 10/19/2015, we will change it to every other day at 2 mg and then subsequently stop after Christmas. If she tolerated treatment well, I will consider adding Daratumumab along with Pomalyst in the future  Thrombocytopenia [facility]) This is likely due to recent treatment. The patient denies recent history of bleeding such as epistaxis, hematuria or hematochezia. She is asymptomatic from the low platelet count. I will observe for now.  she does not require transfusion now. I will continue the chemotherapy at current dose without dosage adjustment.  If the thrombocytopenia gets progressive worse in the future, I might have to delay her treatment or adjust the chemotherapy dose. I recommend she return sooner on a weekly basis for blood work monitoring. If her platelet count is less than 50,000, I might have to hold the treatment and change the dose of treatment. Recommend she watch out for bleeding complications with low platelet count and being on aspirin   Right ischial fracture [facility]) The patient had multiple bone fractures  status post radiation. She continues to have chronic pain, but currently well-controlled with morphine sulfate. She will continue current pain medication regimen. I warned her about potential risks of increased bone pain with dexamethasone taper as outlined above   Tobacco abuse I spent some time counseling the patient the importance of tobacco cessation. she is currently attempting to quit on her own  Dysuria The patient is prone to have recurrent infection. I have ordered urinalysis which came back grossly abnormal. I will proceed with urine culture and prescribed a course of oral amoxicillin  Essential hypertension Her blood pressure is grossly elevated which is suspect was exacerbated by back pain. I would not adjust her medication for now until her pain appears to be under control.     Orders Placed This Encounter  Procedures  . Urine culture    Standing Status: Future     Number of Occurrences: 1     Standing Expiration Date: 11/11/2016  . Urinalysis, Microscopic - [facility]    Standing Status: Future     Number of Occurrences: 1     Standing Expiration Date: 11/11/2016   All questions were answered. The patient knows to call the clinic with any problems, questions or concerns. No barriers to learning was detected. I spent 25 minutes counseling the patient face to face. The total time spent in the appointment was 40 minutes and more than 50% was on counseling and review of test results     [facility], Vinayak Bobier, MD 10/08/2015 1:40 PM

## 2015-10-08 NOTE — Telephone Encounter (Signed)
Gave and printed appt sched and av sfor pt for DEC and Jan 2017 °

## 2015-10-08 NOTE — Assessment & Plan Note (Signed)
This is likely due to recent treatment. The patient denies recent history of bleeding such as epistaxis, hematuria or hematochezia. She is asymptomatic from the low platelet count. I will observe for now.  she does not require transfusion now. I will continue the chemotherapy at current dose without dosage adjustment.  If the thrombocytopenia gets progressive worse in the future, I might have to delay her treatment or adjust the chemotherapy dose. I recommend she return sooner on a weekly basis for blood work monitoring. If her platelet count is less than 50,000, I might have to hold the treatment and change the dose of treatment. Recommend she watch out for bleeding complications with low platelet count and being on aspirin

## 2015-10-09 DIAGNOSIS — M810 Age-related osteoporosis without current pathological fracture: Secondary | ICD-10-CM | POA: Diagnosis not present

## 2015-10-09 DIAGNOSIS — F17210 Nicotine dependence, cigarettes, uncomplicated: Secondary | ICD-10-CM | POA: Diagnosis not present

## 2015-10-09 DIAGNOSIS — S72354D Nondisplaced comminuted fracture of shaft of right femur, subsequent encounter for closed fracture with routine healing: Secondary | ICD-10-CM | POA: Diagnosis not present

## 2015-10-09 DIAGNOSIS — I10 Essential (primary) hypertension: Secondary | ICD-10-CM | POA: Diagnosis not present

## 2015-10-09 DIAGNOSIS — G893 Neoplasm related pain (acute) (chronic): Secondary | ICD-10-CM | POA: Diagnosis not present

## 2015-10-09 DIAGNOSIS — M84551D Pathological fracture in neoplastic disease, right femur, subsequent encounter for fracture with routine healing: Secondary | ICD-10-CM | POA: Diagnosis not present

## 2015-10-09 DIAGNOSIS — C9000 Multiple myeloma not having achieved remission: Secondary | ICD-10-CM | POA: Diagnosis not present

## 2015-10-10 ENCOUNTER — Telehealth: Payer: Self-pay | Admitting: Family Medicine

## 2015-10-10 ENCOUNTER — Telehealth: Payer: Self-pay | Admitting: *Deleted

## 2015-10-10 DIAGNOSIS — M84551D Pathological fracture in neoplastic disease, right femur, subsequent encounter for fracture with routine healing: Secondary | ICD-10-CM | POA: Diagnosis not present

## 2015-10-10 DIAGNOSIS — M810 Age-related osteoporosis without current pathological fracture: Secondary | ICD-10-CM | POA: Diagnosis not present

## 2015-10-10 DIAGNOSIS — C9000 Multiple myeloma not having achieved remission: Secondary | ICD-10-CM | POA: Diagnosis not present

## 2015-10-10 DIAGNOSIS — I10 Essential (primary) hypertension: Secondary | ICD-10-CM | POA: Diagnosis not present

## 2015-10-10 DIAGNOSIS — F17210 Nicotine dependence, cigarettes, uncomplicated: Secondary | ICD-10-CM | POA: Diagnosis not present

## 2015-10-10 DIAGNOSIS — G893 Neoplasm related pain (acute) (chronic): Secondary | ICD-10-CM | POA: Diagnosis not present

## 2015-10-10 LAB — SPEP & IFE WITH QIG
Albumin ELP: 3.8 g/dL (ref 3.8–4.8)
Alpha-1-Globulin: 0.4 g/dL — ABNORMAL HIGH (ref 0.2–0.3)
Alpha-2-Globulin: 0.9 g/dL (ref 0.5–0.9)
BETA GLOBULIN: 0.4 g/dL (ref 0.4–0.6)
Beta 2: 0.3 g/dL (ref 0.2–0.5)
Gamma Globulin: 0.5 g/dL — ABNORMAL LOW (ref 0.8–1.7)
IGA: 39 mg/dL — AB (ref 69–380)
IGG (IMMUNOGLOBIN G), SERUM: 460 mg/dL — AB (ref 690–1700)
IgM, Serum: 26 mg/dL — ABNORMAL LOW (ref 52–322)
Total Protein, Serum Electrophoresis: 6.2 g/dL (ref 6.1–8.1)

## 2015-10-10 LAB — KAPPA/LAMBDA LIGHT CHAINS
KAPPA LAMBDA RATIO: 27.55 — AB (ref 0.26–1.65)
Kappa free light chain: 27 mg/dL — ABNORMAL HIGH (ref 0.33–1.94)
Lambda Free Lght Chn: 0.98 mg/dL (ref 0.57–2.63)

## 2015-10-10 LAB — URINE CULTURE

## 2015-10-10 NOTE — Telephone Encounter (Signed)
Pt says she is feeling a little better on the antibiotic.  She will call us if she starts feeling worse.

## 2015-10-10 NOTE — Telephone Encounter (Signed)
FYI

## 2015-10-10 NOTE — Telephone Encounter (Signed)
Pt will be discharge from physical therapy and agency effective 10/10/2015 [facility].

## 2015-10-10 NOTE — Telephone Encounter (Signed)
-----   Message from Heath Lark, MD sent at 10/10/2015  1:59 PM EST ----- Regarding: UTI Urine culture confirmed UTI Is she better on amoxicillin? ----- Message -----    From: [facility]    Sent: 10/08/2015   1:13 PM      To: Heath Lark, MD

## 2015-10-14 ENCOUNTER — Ambulatory Visit: Payer: Self-pay | Admitting: Family Medicine

## 2015-10-15 ENCOUNTER — Other Ambulatory Visit (HOSPITAL_BASED_OUTPATIENT_CLINIC_OR_DEPARTMENT_OTHER): Payer: Medicare Other

## 2015-10-15 ENCOUNTER — Telehealth: Payer: Self-pay | Admitting: *Deleted

## 2015-10-15 ENCOUNTER — Telehealth: Payer: Self-pay

## 2015-10-15 DIAGNOSIS — C9000 Multiple myeloma not having achieved remission: Secondary | ICD-10-CM | POA: Diagnosis present

## 2015-10-15 LAB — CBC WITH DIFFERENTIAL/PLATELET
BASO%: 0.5 % (ref 0.0–2.0)
Basophils Absolute: 0 10e3/uL (ref 0.0–0.1)
EOS%: 3.2 % (ref 0.0–7.0)
Eosinophils Absolute: 0.1 10e3/uL (ref 0.0–0.5)
HCT: 39.8 % (ref 34.8–46.6)
HGB: 12.9 g/dL (ref 11.6–15.9)
LYMPH%: 30.6 % (ref 14.0–49.7)
MCH: 32.3 pg (ref 25.1–34.0)
MCHC: 32.4 g/dL (ref 31.5–36.0)
MCV: 99.5 fL (ref 79.5–101.0)
MONO#: 0.4 10e3/uL (ref 0.1–0.9)
MONO%: 16.4 % — ABNORMAL HIGH (ref 0.0–14.0)
NEUT#: 1.1 10e3/uL — ABNORMAL LOW (ref 1.5–6.5)
NEUT%: 49.3 % (ref 38.4–76.8)
Platelets: 48 10e3/uL — ABNORMAL LOW (ref 145–400)
RBC: 4 10e6/uL (ref 3.70–5.45)
RDW: 16.1 % — ABNORMAL HIGH (ref 11.2–14.5)
WBC: 2.2 10e3/uL — ABNORMAL LOW (ref 3.9–10.3)
lymph#: 0.7 10e3/uL — ABNORMAL LOW (ref 0.9–3.3)
nRBC: 1 % — ABNORMAL HIGH (ref 0–0)

## 2015-10-15 LAB — COMPREHENSIVE METABOLIC PANEL WITH GFR
ALT: 16 U/L (ref 0–55)
AST: 12 U/L (ref 5–34)
Albumin: 3.3 g/dL — ABNORMAL LOW (ref 3.5–5.0)
Alkaline Phosphatase: 86 U/L (ref 40–150)
Anion Gap: 9 meq/L (ref 3–11)
BUN: 16.9 mg/dL (ref 7.0–26.0)
CO2: 23 meq/L (ref 22–29)
Calcium: 9.5 mg/dL (ref 8.4–10.4)
Chloride: 107 meq/L (ref 98–109)
Creatinine: 0.8 mg/dL (ref 0.6–1.1)
EGFR: >90 ml/min/1.73 m2
Glucose: 105 mg/dL (ref 70–140)
Potassium: 4.4 meq/L (ref 3.5–5.1)
Sodium: 139 meq/L (ref 136–145)
Total Bilirubin: 0.48 mg/dL (ref 0.20–1.20)
Total Protein: 6.4 g/dL (ref 6.4–8.3)

## 2015-10-15 LAB — TECHNOLOGIST REVIEW

## 2015-10-15 NOTE — Telephone Encounter (Signed)
LM to hold treatment due to low cbc. To call if has questions

## 2015-10-15 NOTE — Telephone Encounter (Signed)
-----   Message from Heath Lark, MD sent at 10/15/2015  2:07 PM EST ----- Regarding: low cbc Pls tell her to hold treatment and recheck next week ----- Message -----    From: [facility]    Sent: 10/15/2015  12:12 PM      To: Heath Lark, MD

## 2015-10-15 NOTE — Telephone Encounter (Signed)
Pt returning tammi's call. Told her to hold pomalyst and we will recheck her labs next week and decide on restart at that time. Confirmed lab appt on 20th.

## 2015-10-19 NOTE — Progress Notes (Signed)
  Radiation Oncology         [phone] ________________________________  Name: JESILYN EASOM MRN: [id number]  Date: 09/02/2015  DOB: [birth year]  SIMULATION AND TREATMENT PLANNING NOTE  DIAGNOSIS:     ICD-9-CM ICD-10-CM   1. Multiple myeloma in relapse ([place]) 203.02 C90.02      Site:  Right femur  NARRATIVE:  The patient underwent surgery on the right femur. We therefore have had to undergo a re-simulation for this area to alter the radiation plan to the right femur given the changes in this area. The patient was brought to the [place].  Identity was confirmed.  All relevant records and images related to the planned course of therapy were reviewed.   Written consent to proceed with treatment was confirmed which was freely given after reviewing the details related to the planned course of therapy had been reviewed with the patient.  Then, the patient was set-up in a stable reproducible  supine position for radiation therapy.  CT images were obtained.  Surface markings were placed.     The CT images were loaded into the planning software.  Then the target and avoidance structures were contoured.  Treatment planning then occurred.  The radiation prescription was entered and confirmed.  A total of 2 complex treatment devices were fabricated which relate to the designed radiation treatment fields. Each of these customized fields/ complex treatment devices will be used on a daily basis during the radiation course. I have requested : Isodose Plan.   PLAN:  The patient will receive 25 Gy in 10 fractions to the right proximal femur as had originally been planned.  ________________________________   Jodelle Gross, MD, PhD

## 2015-10-19 NOTE — Addendum Note (Signed)
Encounter addended by: Kyung Rudd, MD on: 10/19/2015  5:03 PM<BR>     Documentation filed: Notes Section, Visit Diagnoses

## 2015-10-19 NOTE — Addendum Note (Signed)
Encounter addended by: Kyung Rudd, MD on: 10/19/2015  5:01 PM<BR>     Documentation filed: Notes Section, Visit Diagnoses

## 2015-10-19 NOTE — Progress Notes (Signed)
  Radiation Oncology         [phone] ________________________________  Name: Kendra Johns MRN: [id number]  Date: 08/16/2015  DOB: Feb 02, 1951  SIMULATION AND TREATMENT PLANNING NOTE  DIAGNOSIS:     ICD-9-CM ICD-10-CM   1. Multiple myeloma in relapse ([place]) 203.02 C90.02      Site:   1.  Right proximal femur 2.  L1/2 spine  NARRATIVE:  The patient was brought to the [place].  Identity was confirmed.  All relevant records and images related to the planned course of therapy were reviewed.   Written consent to proceed with treatment was confirmed which was freely given after reviewing the details related to the planned course of therapy had been reviewed with the patient.  Then, the patient was set-up in a stable reproducible  supine position for radiation therapy.  CT images were obtained.  Surface markings were placed.    Medically necessary complex treatment device(s) for immobilization:  Customized vac lock bag.   The CT images were loaded into the planning software.  Then the target and avoidance structures were contoured.  Treatment planning then occurred.  The radiation prescription was entered and confirmed.  A total of 5 complex treatment devices were fabricated which relate to the designed radiation treatment fields: 3 customized fields to treat the lumbar spine and to AP PA fields to treat the right femur. Each of these customized fields/ complex treatment devices will be used on a daily basis during the radiation course. I have requested : 3D Simulation  I have requested a DVH of the following structures: Target volume, left kidney, right kidney, spine.   PLAN:  The patient will receive 25 Gy in 10 fractions to each target site.  ________________________________   Jodelle Gross, MD, PhD

## 2015-10-19 NOTE — Progress Notes (Signed)
  Radiation Oncology         [phone] ________________________________  Name: Kendra Johns MRN: [id number]  Date: 09/20/2015  DOB: 1951-07-23  End of Treatment Note  Diagnosis:   Multiple myeloma     Indication for treatment::  palliative       Radiation treatment dates:   08/22/2015 through 09/20/2015  Site/dose:    1.  Right proximal femur 2.  L1/2 spine  The patient received a total dose of 25 gray in 10 fractions to each of these regions. The right proximal femur was treated with 2 AP PA fields. The lumbar spine region was treated using a 3 field technique. This consisted of a 3-D conformal radiation treatment plan.  Narrative: The patwas able to complete her prescribed course of treatment. She underwent surgery on the right femur during her treatment which prompted a re-simulation/3-D planning.  Plan: The patient has completed radiation treatment. The patient will return to radiation oncology clinic for routine followup in one month. I advised the patient to call or return sooner if they have any questions or concerns related to their recovery or treatment. ________________________________  Jodelle Gross, M.D., Ph.D.

## 2015-10-22 ENCOUNTER — Other Ambulatory Visit (HOSPITAL_BASED_OUTPATIENT_CLINIC_OR_DEPARTMENT_OTHER): Payer: Medicare Other

## 2015-10-22 ENCOUNTER — Telehealth: Payer: Self-pay | Admitting: *Deleted

## 2015-10-22 DIAGNOSIS — C9000 Multiple myeloma not having achieved remission: Secondary | ICD-10-CM

## 2015-10-22 LAB — CBC WITH DIFFERENTIAL/PLATELET
BASO%: 1.4 % (ref 0.0–2.0)
BASOS ABS: 0.1 10*3/uL (ref 0.0–0.1)
EOS%: 0.6 % (ref 0.0–7.0)
Eosinophils Absolute: 0 10*3/uL (ref 0.0–0.5)
HEMATOCRIT: 40.1 % (ref 34.8–46.6)
HGB: 12.7 g/dL (ref 11.6–15.9)
LYMPH#: 1.2 10*3/uL (ref 0.9–3.3)
LYMPH%: 27.8 % (ref 14.0–49.7)
MCH: 31.3 pg (ref 25.1–34.0)
MCHC: 31.7 g/dL (ref 31.5–36.0)
MCV: 98.6 fL (ref 79.5–101.0)
MONO#: 1 10*3/uL — ABNORMAL HIGH (ref 0.1–0.9)
MONO%: 23.4 % — ABNORMAL HIGH (ref 0.0–14.0)
NEUT#: 2 10*3/uL (ref 1.5–6.5)
NEUT%: 46.8 % (ref 38.4–76.8)
PLATELETS: 120 10*3/uL — AB (ref 145–400)
RBC: 4.06 10*6/uL (ref 3.70–5.45)
RDW: 17 % — ABNORMAL HIGH (ref 11.2–14.5)
WBC: 4.2 10*3/uL (ref 3.9–10.3)

## 2015-10-22 LAB — COMPREHENSIVE METABOLIC PANEL
ALT: 15 U/L (ref 0–55)
ANION GAP: 10 meq/L (ref 3–11)
AST: 16 U/L (ref 5–34)
Albumin: 3.6 g/dL (ref 3.5–5.0)
Alkaline Phosphatase: 104 U/L (ref 40–150)
BUN: 11.4 mg/dL (ref 7.0–26.0)
CALCIUM: 9.3 mg/dL (ref 8.4–10.4)
CHLORIDE: 105 meq/L (ref 98–109)
CO2: 24 meq/L (ref 22–29)
Creatinine: 0.7 mg/dL (ref 0.6–1.1)
EGFR: >90 mL/min/{1.73_m2} (ref >90)
Glucose: 94 mg/dl (ref 70–140)
POTASSIUM: 3.8 meq/L (ref 3.5–5.1)
Sodium: 139 mEq/L (ref 136–145)
Total Bilirubin: 0.57 mg/dL (ref 0.20–1.20)
Total Protein: 6.5 g/dL (ref 6.4–8.3)

## 2015-10-22 MED ORDER — POMALIDOMIDE 3 MG PO CAPS: 3.0000 mg | ORAL_CAPSULE | Freq: Every day | ORAL | Status: DC

## 2015-10-22 NOTE — Telephone Encounter (Signed)
Labs improved today. Dr. Alvy Bimler instructs pt to resume Pomalyst the rest of this cycle,  Take a break and then start Next cycle on 11/03/15.   Pomalyst dose decreased to 3 mg on next cycle.  New Rx faxed to [facility].  Asked pt to call nurse back to confirm new instructions.

## 2015-10-22 NOTE — Telephone Encounter (Signed)
Pt called back and left voice message she got Cameo's message and will start medication tomorrow.

## 2015-10-24 NOTE — Telephone Encounter (Signed)
Call received from patient asking if Pomalyst has been sent to Pharmacy.  Informed her it was faxed to [facility] on 10-22-2015.  Should receive call from [facility] when they receive fax to arrange shipment.  Faxed request take longer to process.

## 2015-10-25 ENCOUNTER — Ambulatory Visit: Admission: RE | Admit: 2015-10-25 | Payer: Medicare Other | Source: Ambulatory Visit | Admitting: Radiation Oncology

## 2015-10-29 ENCOUNTER — Other Ambulatory Visit (HOSPITAL_BASED_OUTPATIENT_CLINIC_OR_DEPARTMENT_OTHER): Payer: Medicare Other

## 2015-10-29 ENCOUNTER — Other Ambulatory Visit: Payer: Self-pay | Admitting: Family Medicine

## 2015-10-29 DIAGNOSIS — C9000 Multiple myeloma not having achieved remission: Secondary | ICD-10-CM

## 2015-10-29 LAB — CBC WITH DIFFERENTIAL/PLATELET
BASO%: 0.9 % (ref 0.0–2.0)
BASOS ABS: 0 10*3/uL (ref 0.0–0.1)
EOS%: 3.1 % (ref 0.0–7.0)
Eosinophils Absolute: 0.1 10*3/uL (ref 0.0–0.5)
HCT: 39.3 % (ref 34.8–46.6)
HEMOGLOBIN: 12.4 g/dL (ref 11.6–15.9)
LYMPH#: 0.8 10*3/uL — AB (ref 0.9–3.3)
LYMPH%: 18.1 % (ref 14.0–49.7)
MCH: 31.7 pg (ref 25.1–34.0)
MCHC: 31.6 g/dL (ref 31.5–36.0)
MCV: 100.5 fL (ref 79.5–101.0)
MONO#: 0.5 10*3/uL (ref 0.1–0.9)
MONO%: 11.7 % (ref 0.0–14.0)
NEUT%: 66.2 % (ref 38.4–76.8)
NEUTROS ABS: 3 10*3/uL (ref 1.5–6.5)
NRBC: 1 % — AB (ref 0–0)
Platelets: 164 10*3/uL (ref 145–400)
RBC: 3.91 10*6/uL (ref 3.70–5.45)
RDW: 16.5 % — AB (ref 11.2–14.5)
WBC: 4.5 10*3/uL (ref 3.9–10.3)

## 2015-10-29 LAB — COMPREHENSIVE METABOLIC PANEL
ALT: 22 U/L (ref 0–55)
AST: 20 U/L (ref 5–34)
Albumin: 3.5 g/dL (ref 3.5–5.0)
Alkaline Phosphatase: 98 U/L (ref 40–150)
Anion Gap: 10 mEq/L (ref 3–11)
BILIRUBIN TOTAL: 0.37 mg/dL (ref 0.20–1.20)
BUN: 15.5 mg/dL (ref 7.0–26.0)
CO2: 23 meq/L (ref 22–29)
Calcium: 9.1 mg/dL (ref 8.4–10.4)
Chloride: 110 mEq/L — ABNORMAL HIGH (ref 98–109)
Creatinine: 0.8 mg/dL (ref 0.6–1.1)
GLUCOSE: 101 mg/dL (ref 70–140)
Potassium: 3.8 mEq/L (ref 3.5–5.1)
SODIUM: 143 meq/L (ref 136–145)
TOTAL PROTEIN: 6.3 g/dL — AB (ref 6.4–8.3)

## 2015-10-29 LAB — TECHNOLOGIST REVIEW

## 2015-10-29 MED ORDER — LOSARTAN POTASSIUM 50 MG PO TABS: 50.0000 mg | ORAL_TABLET | Freq: Every day | ORAL | Status: DC

## 2015-10-30 ENCOUNTER — Other Ambulatory Visit: Payer: Self-pay | Admitting: *Deleted

## 2015-10-30 MED ORDER — LEVOTHYROXINE SODIUM 50 MCG PO TABS: ORAL_TABLET | ORAL | Status: DC

## 2015-11-05 ENCOUNTER — Encounter: Payer: Self-pay | Admitting: Hematology and Oncology

## 2015-11-05 ENCOUNTER — Other Ambulatory Visit (HOSPITAL_BASED_OUTPATIENT_CLINIC_OR_DEPARTMENT_OTHER): Payer: Medicare Other

## 2015-11-05 ENCOUNTER — Telehealth: Payer: Self-pay | Admitting: Hematology and Oncology

## 2015-11-05 ENCOUNTER — Ambulatory Visit (HOSPITAL_BASED_OUTPATIENT_CLINIC_OR_DEPARTMENT_OTHER): Payer: Medicare Other | Admitting: Hematology and Oncology

## 2015-11-05 VITALS — BP 129/89 | HR 94 | Temp 97.8°F | Resp 18 | Ht 64.0 in | Wt 151.5 lb

## 2015-11-05 DIAGNOSIS — C9002 Multiple myeloma in relapse: Secondary | ICD-10-CM

## 2015-11-05 DIAGNOSIS — M542 Cervicalgia: Secondary | ICD-10-CM

## 2015-11-05 DIAGNOSIS — Z720 Tobacco use: Secondary | ICD-10-CM

## 2015-11-05 DIAGNOSIS — C9000 Multiple myeloma not having achieved remission: Secondary | ICD-10-CM

## 2015-11-05 DIAGNOSIS — M545 Low back pain: Secondary | ICD-10-CM | POA: Diagnosis not present

## 2015-11-05 DIAGNOSIS — M898X9 Other specified disorders of bone, unspecified site: Secondary | ICD-10-CM | POA: Diagnosis not present

## 2015-11-05 DIAGNOSIS — G8929 Other chronic pain: Secondary | ICD-10-CM

## 2015-11-05 LAB — CBC WITH DIFFERENTIAL/PLATELET
BASO%: 1.4 % (ref 0.0–2.0)
Basophils Absolute: 0.1 10*3/uL (ref 0.0–0.1)
EOS ABS: 0.2 10*3/uL (ref 0.0–0.5)
EOS%: 4.7 % (ref 0.0–7.0)
HCT: 38.7 % (ref 34.8–46.6)
HGB: 12.4 g/dL (ref 11.6–15.9)
LYMPH%: 29.3 % (ref 14.0–49.7)
MCH: 31.7 pg (ref 25.1–34.0)
MCHC: 32.1 g/dL (ref 31.5–36.0)
MCV: 98.8 fL (ref 79.5–101.0)
MONO#: 1 10*3/uL — ABNORMAL HIGH (ref 0.1–0.9)
MONO%: 25.5 % — AB (ref 0.0–14.0)
NEUT%: 39.1 % (ref 38.4–76.8)
NEUTROS ABS: 1.6 10*3/uL (ref 1.5–6.5)
Platelets: 167 10*3/uL (ref 145–400)
RBC: 3.92 10*6/uL (ref 3.70–5.45)
RDW: 16.8 % — ABNORMAL HIGH (ref 11.2–14.5)
WBC: 4 10*3/uL (ref 3.9–10.3)
lymph#: 1.2 10*3/uL (ref 0.9–3.3)

## 2015-11-05 LAB — COMPREHENSIVE METABOLIC PANEL
ALT: 22 U/L (ref 0–55)
AST: 24 U/L (ref 5–34)
Albumin: 3.3 g/dL — ABNORMAL LOW (ref 3.5–5.0)
Alkaline Phosphatase: 101 U/L (ref 40–150)
Anion Gap: 8 mEq/L (ref 3–11)
BUN: 9.5 mg/dL (ref 7.0–26.0)
CO2: 26 meq/L (ref 22–29)
Calcium: 9.4 mg/dL (ref 8.4–10.4)
Chloride: 109 mEq/L (ref 98–109)
Creatinine: 0.7 mg/dL (ref 0.6–1.1)
GLUCOSE: 101 mg/dL (ref 70–140)
POTASSIUM: 4.5 meq/L (ref 3.5–5.1)
SODIUM: 143 meq/L (ref 136–145)
TOTAL PROTEIN: 6.2 g/dL — AB (ref 6.4–8.3)
Total Bilirubin: 0.4 mg/dL (ref 0.20–1.20)

## 2015-11-05 LAB — TECHNOLOGIST REVIEW

## 2015-11-05 NOTE — Assessment & Plan Note (Signed)
One of the recent, acute COPD exacerbation nearly caused respiratory failure to the point of death. She had recurrent admission to the hospital last month.  When she was sick, she quit smoking. Now that she is better, she resumed smoking again. Again, I spent some time explaining to her the importance of nicotine cessation. I encouraged her to use her nicotine patches.

## 2015-11-05 NOTE — Progress Notes (Signed)
[place] OFFICE PROGRESS NOTE  Patient Care Team: Marin Olp, MD as PCP - General (Family Medicine) Jeanann Lewandowsky, MD as Consulting Physician (Medical Oncology) Marin Olp, MD as Consulting Physician (Family Medicine) Heath Lark, MD as Consulting Physician (Hematology and Oncology)  SUMMARY OF ONCOLOGIC HISTORY: Oncology History   Multiple myeloma, kappa light chain disease, Durie-Salmon stage III     Multiple myeloma in relapse ([place])   07/16/2006 Bone Marrow Biopsy BM biopsy is non-diagnostic   09/21/2006 Procedure L5 vertebral biopsy 5% plasma cell   10/25/2006 Bone Marrow Biopsy BM biopsy is hypercellular with 5% plasma cell   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   04/13/2011 Relapse/Recurrence Disease relapse   04/14/2011 Bone Marrow Biopsy Bm biopsy showed 2 % plasma cell   04/27/2011 Relapse/Recurrence Disease relapse, treated with Velcade/Cytoxan/Dex   09/07/2011 - 07/28/2013 Chemotherapy She has been receiving Velcade   12/27/2011 Bone Marrow Transplant 2nd transplant at [facility]   07/28/2013 Relapse/Recurrence Chemo is stopped due to progression of disease   08/29/2013 Imaging PEt/CT showed recurrence of disease with new lesion on her rib with compression fracture   09/13/2013 Bone Marrow Biopsy BM biopsy is hypercellular with 6% plasma cell   10/10/2013 - 10/20/2013 Radiation Therapy Started on palliative XRT for rib pain   11/27/2013 - 04/06/2014 Chemotherapy The patient starts chemotherapy with Carfilzomib   07/19/2014 Imaging Repeat bloodwork and PET/CT scan shows significant disease progression.   08/02/2014  - 03/06/2015 Chemotherapy She enrolled in clinical trial using combination therapy with Revlimid, dexamethasone and Elotuzumab   03/07/2015 - 06/04/2015 Chemotherapy She is on maintenance Revlimid only without dexamethasone   05/27/2015 Imaging  MRI spine showed new compression fracture.   06/04/2015 - 06/18/2015 Radiation Therapy  she received palliative radiation therapy to 25 Gy C7-T1   07/10/2015 - 08/07/2015 Chemotherapy She received Elotuzumab, dex and Revlimid. Rx is stopped due to progression   08/01/2015 Imaging MRI of the spine was performed today due to new onset of worsening right hip pain/flank area. MRI show mild progression of the L1 vertebral body.   08/21/2015 - 08/26/2015 Hospital Admission She was admitted to the hospital due to pathologic fracture to right femur   08/22/2015 Surgery She had IM nail to fractured femur   08/23/2015 - 09/20/2015 Radiation Therapy She received XRT to her L1 L2 and Right Femur   09/05/2015 - 09/06/2015 Hospital Admission She had recurrent admission due to displaced fracture, managed conservatively   09/12/2015 - 09/17/2015 Hospital Admission She had recurrent admission for COPD exacerbation   09/30/2015 -  Chemotherapy She was started on Pomalyst    INTERVAL HISTORY: Please see below for problem oriented charting.  she is seen today for further follow-up. She tolerated chemotherapy well. She denies recent fevers or chills. No recent cough. She complained of an area of numbness at the occipital area and some bilateral neck pain. They come and go. They resolved with aspirin. She has some occasional headaches. She feels all right today. She denies worsening bone pain in her hips , back or her leg.  REVIEW OF SYSTEMS:   Constitutional: Denies fevers, chills or abnormal weight loss Eyes: Denies  blurriness of vision Ears, nose, mouth, throat, and face: Denies mucositis or sore throat Respiratory: Denies cough, dyspnea or wheezes Cardiovascular: Denies  palpitation, chest discomfort or lower extremity swelling Gastrointestinal:  Denies nausea, heartburn or change in bowel habits Skin: Denies abnormal skin rashes Lymphatics: Denies new lymphadenopathy or easy bruising Neurological:Denies numbness, tingling or new weaknesses Behavioral/Psych: Mood is stable, no new changes  All other systems were reviewed with the patient and are negative.  I have reviewed the past medical history, past surgical history, social history and family history with the patient and they are unchanged from previous note.  ALLERGIES:  is allergic to codeine; ibuprofen; and albuterol.  MEDICATIONS:  Current Outpatient Prescriptions  Medication Sig Dispense Refill  . ALPRAZolam (XANAX) 0.25 MG tablet Take 1 tablet (0.25 mg total) by mouth at bedtime as needed for anxiety. 30 tablet 0  . aspirin 81 MG tablet Take 81 mg by mouth daily.    . Cholecalciferol (VITAMIN D3) 5000 UNITS CAPS Take 5,000 Units by mouth daily.     . cyclobenzaprine (FLEXERIL) 10 MG tablet Take 1 tablet (10 mg total) by mouth 3 (three) times daily as needed for muscle spasms. 15 tablet 0  . ipratropium (ATROVENT HFA) 17 MCG/ACT inhaler Inhale 2 puffs into the lungs every 4 (four) hours as needed for wheezing. 1 Inhaler 12  . levothyroxine (SYNTHROID, LEVOTHROID) 50 MCG tablet TAKE 1 TABLET (50 MCG TOTAL) BY MOUTH DAILY BEFORE BREAKFAST. 90 tablet 1  . losartan (COZAAR) 50 MG tablet Take 1 tablet (50 mg total) by mouth daily. 90 tablet 1  . magic mouthwash SOLN Take 10 mLs by mouth 4 (four) times daily as needed for mouth pain. Swish and spit  Or  Swish and swallow. 480 mL 2  . morphine (MS CONTIN) 15 MG 12 hr tablet Take 5 tablets (75 mg total) by mouth every 8 (eight) hours. (Patient taking differently: Take 75 mg by mouth every 8 (eight) hours as needed for pain. ) 60 tablet 0  . morphine (MSIR) 30 MG tablet Take 1-1.5 tablets (30-45 mg total) by mouth every 2 (two) hours as needed for moderate  pain or severe pain. (Patient taking differently: Take 30-45 mg by mouth every 2 (two) hours as needed for moderate pain or severe pain. Take one tablet every 2 hours as needed for moderate pain) 30 tablet 0  . nicotine (NICODERM CQ - DOSED IN MG/24 HOURS) 14 mg/24hr patch Place 1 patch (14 mg total) onto the skin daily. 28 patch 0  . pomalidomide (POMALYST) 3 MG capsule Take 1 capsule (3 mg total) by mouth daily. Take with water on days 1-21. Repeat every 28 days. 21 capsule 0  . senna-docusate (SENOKOT-S) 8.6-50 MG tablet Take 1 tablet by mouth daily.    . [DISCONTINUED] amLODipine (NORVASC) 5 MG tablet Take 1 tablet (5 mg total) by mouth daily. 90 tablet 3  . [DISCONTINUED] gabapentin (NEURONTIN) 600 MG tablet Take 600 mg by mouth 3 (three) times daily.       No current facility-administered medications for this visit.    PHYSICAL EXAMINATION: ECOG PERFORMANCE STATUS: 1 - Symptomatic but completely ambulatory  Filed Vitals:   11/05/15 1224  BP: 129/89  Pulse: 94  Temp: 97.8 F (36.6 C)  Resp: 18   Filed Weights   11/05/15 1224  Weight: 151 lb 8 oz (68.72 kg)    GENERAL:alert, no distress and comfortable SKIN: skin color, texture, turgor are normal, no rashes or significant lesions EYES:  normal, Conjunctiva are pink and non-injected, sclera clear OROPHARYNX:no exudate, no erythema and lips, buccal mucosa, and tongue normal  NECK: supple, thyroid normal size, non-tender, without nodularity LYMPH:  no palpable lymphadenopathy in the cervical, axillary or inguinal LUNGS: clear to auscultation and percussion with normal breathing effort HEART: regular rate & rhythm and no murmurs and no lower extremity edema ABDOMEN:abdomen soft, non-tender and normal bowel sounds Musculoskeletal:no cyanosis of digits and no clubbing  NEURO: alert & oriented x 3 with fluent speech, no focal motor/sensory deficits  LABORATORY DATA:  I have reviewed the data as listed    Component Value Date/Time    NA 143 11/05/2015 1208   NA 141 09/11/2015 2255   K 4.5 11/05/2015 1208   K 4.3 09/11/2015 2255   CL 107 09/11/2015 2255   CL 107 03/10/2013 1014   CO2 26 11/05/2015 1208   CO2 27 09/11/2015 2255   GLUCOSE 101 11/05/2015 1208   GLUCOSE 106* 09/11/2015 2255   GLUCOSE 111* 03/10/2013 1014   GLUCOSE 98 08/31/2006 1024   BUN 9.5 11/05/2015 1208   BUN 16 09/11/2015 2255   CREATININE 0.7 11/05/2015 1208   CREATININE 0.72 09/12/2015 0620   CALCIUM 9.4 11/05/2015 1208   CALCIUM 9.1 09/11/2015 2255   PROT 6.2* 11/05/2015 1208   PROT 6.6 09/11/2014 0336   ALBUMIN 3.3* 11/05/2015 1208   ALBUMIN 3.4* 09/11/2014 0336   AST 24 11/05/2015 1208   AST 18 09/11/2014 0336   ALT 22 11/05/2015 1208   ALT 18 09/11/2014 0336   ALKPHOS 101 11/05/2015 1208   ALKPHOS 113 09/11/2014 0336   BILITOT 0.40 11/05/2015 1208   BILITOT 0.6 09/11/2014 0336   GFRNONAA >60 09/12/2015 0620   GFRAA >60 09/12/2015 0620    No results found for: SPEP, UPEP  Lab Results  Component Value Date   WBC 4.0 11/05/2015   NEUTROABS 1.6 11/05/2015   HGB 12.4 11/05/2015   HCT 38.7 11/05/2015   MCV 98.8 11/05/2015   PLT 167 11/05/2015      Chemistry      Component Value Date/Time   NA 143 11/05/2015 1208   NA 141 09/11/2015 2255   K 4.5 11/05/2015 1208   K 4.3 09/11/2015 2255   CL 107 09/11/2015 2255   CL 107 03/10/2013 1014   CO2 26 11/05/2015 1208   CO2 27 09/11/2015 2255   BUN 9.5 11/05/2015 1208   BUN 16 09/11/2015 2255   CREATININE 0.7 11/05/2015 1208   CREATININE 0.72 09/12/2015 0620      Component Value Date/Time   CALCIUM 9.4 11/05/2015 1208   CALCIUM 9.1 09/11/2015 2255   ALKPHOS 101 11/05/2015 1208   ALKPHOS 113 09/11/2014 0336   AST 24 11/05/2015 1208   AST 18 09/11/2014 0336   ALT 22 11/05/2015 1208   ALT 18 09/11/2014 0336   BILITOT 0.40 11/05/2015 1208   BILITOT 0.6 09/11/2014 0336    ASSESSMENT & PLAN:  Multiple myeloma in relapse ([place])  So far, she tolerated Pomalyst well  apart from mild pancytopenia requiring dose adjustment. I will recheck her serum protein electrophoresis and free light chain in 2 weeks. If she have good response and is improving clinically, I will consider adding Daratumumab per discussion with her hematologist from [facility].  Neck pain, bilateral posterior  She have chronic back pain and bone pain. She denies worsening pain in her hips. Recently she described numbness and bilateral neck pain that comes and goes. Examination today is benign. I  recommend her to continue her pain medicine and monitor carefully. If her pain is worse, I will consider ordering an MRI.  Tobacco abuse One of the recent, acute COPD exacerbation nearly caused respiratory failure to the point of death. She had recurrent admission to the hospital last month.  When she was sick, she quit smoking. Now that she is better, she resumed smoking again. Again, I spent some time explaining to her the importance of nicotine cessation. I encouraged her to use her nicotine patches.   Orders Placed This Encounter  Procedures  . CBC with Differential/Platelet    Standing Status: Future     Number of Occurrences:      Standing Expiration Date: 12/09/2016  . Comprehensive metabolic panel    Standing Status: Future     Number of Occurrences:      Standing Expiration Date: 12/09/2016  . SPEP & IFE with QIG    Standing Status: Future     Number of Occurrences:      Standing Expiration Date: 12/09/2016  . Kappa/lambda light chains    Standing Status: Future     Number of Occurrences:      Standing Expiration Date: 12/09/2016   All questions were answered. The patient knows to call the clinic with any problems, questions or concerns. No barriers to learning was detected. I spent 15 minutes counseling the patient face to face. The total time spent in the appointment was 20 minutes and more than 50% was on counseling and review of test results     [facility], [place], MD 11/05/2015 2:21  PM

## 2015-11-05 NOTE — Assessment & Plan Note (Signed)
So far, she tolerated Pomalyst well apart from mild pancytopenia requiring dose adjustment. I will recheck her serum protein electrophoresis and free light chain in 2 weeks. If she have good response and is improving clinically, I will consider adding Daratumumab per discussion with her hematologist from [facility].

## 2015-11-05 NOTE — Assessment & Plan Note (Signed)
She have chronic back pain and bone pain. She denies worsening pain in her hips. Recently she described numbness and bilateral neck pain that comes and goes. Examination today is benign. I recommend her to continue her pain medicine and monitor carefully. If her pain is worse, I will consider ordering an MRI.

## 2015-11-05 NOTE — Telephone Encounter (Signed)
Talked to patient here in office. Scheduled appt.       [facility]. °

## 2015-11-06 DIAGNOSIS — G894 Chronic pain syndrome: Secondary | ICD-10-CM | POA: Diagnosis not present

## 2015-11-06 DIAGNOSIS — M545 Low back pain: Secondary | ICD-10-CM | POA: Diagnosis not present

## 2015-11-06 DIAGNOSIS — K5900 Constipation, unspecified: Secondary | ICD-10-CM | POA: Diagnosis not present

## 2015-11-06 DIAGNOSIS — Z79891 Long term (current) use of opiate analgesic: Secondary | ICD-10-CM | POA: Diagnosis not present

## 2015-11-13 ENCOUNTER — Ambulatory Visit: Payer: Medicare Other | Admitting: Radiation Oncology

## 2015-11-13 ENCOUNTER — Ambulatory Visit: Admission: RE | Admit: 2015-11-13 | Payer: Medicare Other | Source: Ambulatory Visit | Admitting: Radiation Oncology

## 2015-11-14 ENCOUNTER — Other Ambulatory Visit: Payer: Self-pay | Admitting: Hematology and Oncology

## 2015-11-14 ENCOUNTER — Telehealth: Payer: Self-pay | Admitting: *Deleted

## 2015-11-14 ENCOUNTER — Ambulatory Visit (HOSPITAL_COMMUNITY): Payer: Medicare Other

## 2015-11-14 ENCOUNTER — Telehealth: Payer: Self-pay | Admitting: Hematology and Oncology

## 2015-11-14 ENCOUNTER — Ambulatory Visit (HOSPITAL_COMMUNITY)
Admission: RE | Admit: 2015-11-14 | Discharge: 2015-11-14 | Disposition: A | Discharge status: Home / Self Care | Payer: Medicare Other | Source: Ambulatory Visit | Attending: Hematology and Oncology | Admitting: Hematology and Oncology

## 2015-11-14 ENCOUNTER — Encounter: Payer: Self-pay | Admitting: Hematology and Oncology

## 2015-11-14 DIAGNOSIS — R51 Headache: Secondary | ICD-10-CM | POA: Diagnosis not present

## 2015-11-14 DIAGNOSIS — C9002 Multiple myeloma in relapse: Secondary | ICD-10-CM

## 2015-11-14 DIAGNOSIS — R519 Headache, unspecified: Secondary | ICD-10-CM

## 2015-11-14 HISTORY — DX: Headache, unspecified: R51.9

## 2015-11-14 MED ORDER — DEXAMETHASONE 4 MG PO TABS: 4.0000 mg | ORAL_TABLET | Freq: Every day | ORAL | Status: DC

## 2015-11-14 MED ORDER — IOHEXOL 300 MG/ML  SOLN
75.0000 mL | Freq: Once | INTRAMUSCULAR | Status: AC | PRN
  Administered 2015-11-14: 75 mL via INTRAVENOUS

## 2015-11-14 NOTE — Telephone Encounter (Signed)
I will e scribe to her pharmacy

## 2015-11-14 NOTE — Telephone Encounter (Signed)
Left VM to inform pt Rx Dex sent to [facility],  Take 4 mg daily.

## 2015-11-14 NOTE — Telephone Encounter (Signed)
CT does not require pre cert per Benedetto Goad.   Scheduled CT this afternoon at 1 pm.  Pt aware and will arrive at 12:45 pm for 1 pm Scan.  Pt says she does not have any dexamethasone left and will need new Rx sent to [facility] pharmacy.

## 2015-11-14 NOTE — Telephone Encounter (Signed)
Spoke with patient and she is aware of her appointments on 1/16

## 2015-11-14 NOTE — Telephone Encounter (Signed)
Pt reports severe headaches every morning getting worse.  She wakes in morning w/ pain at base/back of head "very very painful" states feels like someone has beat her with a hammer.  Aspirin 325 mg is the only thing that seems to help.  Pain is not as severe during the day, but really bad every morning.

## 2015-11-14 NOTE — Telephone Encounter (Signed)
The headaches could be steroid withdrawal or something worse. Please tell her to resume dexamethasone 4 mg daily I will order an urgent CT head for evaluation and see her on Monday Please call precert and central scheduling; I ordered CT urgent

## 2015-11-14 NOTE — Telephone Encounter (Signed)
FYI "I'm having trouble with transportation, need to reschedule scan. I can't get there." Call transferred to Central scheduling and rescheduled for 5:00 pm today.

## 2015-11-18 ENCOUNTER — Telehealth: Payer: Self-pay | Admitting: *Deleted

## 2015-11-18 ENCOUNTER — Other Ambulatory Visit: Payer: Self-pay

## 2015-11-18 ENCOUNTER — Ambulatory Visit: Payer: Self-pay | Admitting: Hematology and Oncology

## 2015-11-18 NOTE — Telephone Encounter (Signed)
-----   Message from Heath Lark, MD sent at 11/18/2015 11:27 AM EST ----- Regarding: added appt We added appt last week because of her new symptom Now she is no show She has appt next week If she feels fine, remind her with appt on Wed and see me next week

## 2015-11-18 NOTE — Telephone Encounter (Signed)
Pt states she was not able to make appt this morning due to hip pain.  She says she left a VM this morning at 8:30 am with someone at our clinic.   Reports her Headaches are a little better.  She is taking  Dex 4 mg daily and also one Aspirin 325 mg in the evening.  This has seemed to help.  Informed her CT did not show anything and since she is doing better,  continue Dexamethasone and keep appts as scheduled.  Pt confirmed Lab this week on Wed and Dr. Alvy Bimler next week on Wed..   Instructed pt to call if anything new or worse before next appt.. She verbalized understanding.

## 2015-11-20 ENCOUNTER — Other Ambulatory Visit (HOSPITAL_BASED_OUTPATIENT_CLINIC_OR_DEPARTMENT_OTHER): Payer: Medicare Other

## 2015-11-20 DIAGNOSIS — C9002 Multiple myeloma in relapse: Secondary | ICD-10-CM | POA: Diagnosis not present

## 2015-11-20 LAB — CBC WITH DIFFERENTIAL/PLATELET
BASO%: 1 % (ref 0.0–2.0)
Basophils Absolute: 0.1 10*3/uL (ref 0.0–0.1)
EOS%: 3.4 % (ref 0.0–7.0)
Eosinophils Absolute: 0.2 10*3/uL (ref 0.0–0.5)
HEMATOCRIT: 37.3 % (ref 34.8–46.6)
HEMOGLOBIN: 12 g/dL (ref 11.6–15.9)
LYMPH#: 0.7 10*3/uL — AB (ref 0.9–3.3)
LYMPH%: 12.3 % — ABNORMAL LOW (ref 14.0–49.7)
MCH: 31 pg (ref 25.1–34.0)
MCHC: 32.2 g/dL (ref 31.5–36.0)
MCV: 96.3 fL (ref 79.5–101.0)
MONO#: 1.5 10*3/uL — ABNORMAL HIGH (ref 0.1–0.9)
MONO%: 26.7 % — AB (ref 0.0–14.0)
NEUT#: 3.2 10*3/uL (ref 1.5–6.5)
NEUT%: 56.6 % (ref 38.4–76.8)
Platelets: 221 10*3/uL (ref 145–400)
RBC: 3.88 10*6/uL (ref 3.70–5.45)
RDW: 17.2 % — AB (ref 11.2–14.5)
WBC: 5.6 10*3/uL (ref 3.9–10.3)

## 2015-11-20 LAB — COMPREHENSIVE METABOLIC PANEL
ALBUMIN: 3.4 g/dL — AB (ref 3.5–5.0)
ALK PHOS: 92 U/L (ref 40–150)
ALT: 11 U/L (ref 0–55)
AST: 14 U/L (ref 5–34)
Anion Gap: 8 mEq/L (ref 3–11)
BUN: 12.4 mg/dL (ref 7.0–26.0)
CALCIUM: 9 mg/dL (ref 8.4–10.4)
CHLORIDE: 110 meq/L — AB (ref 98–109)
CO2: 23 mEq/L (ref 22–29)
CREATININE: 0.8 mg/dL (ref 0.6–1.1)
EGFR: >90 mL/min/{1.73_m2} (ref >90)
Glucose: 135 mg/dl (ref 70–140)
POTASSIUM: 4.4 meq/L (ref 3.5–5.1)
SODIUM: 141 meq/L (ref 136–145)
Total Bilirubin: 0.47 mg/dL (ref 0.20–1.20)
Total Protein: 5.9 g/dL — ABNORMAL LOW (ref 6.4–8.3)

## 2015-11-21 LAB — KAPPA/LAMBDA LIGHT CHAINS
Ig Kappa Free Light Chain: 346.38 mg/L — ABNORMAL HIGH (ref 3.30–19.40)
Ig Lambda Free Light Chain: 11.45 mg/L (ref 5.71–26.30)
Kappa/Lambda FluidC Ratio: 30.25 — ABNORMAL HIGH (ref 0.26–1.65)

## 2015-11-25 LAB — MULTIPLE MYELOMA PANEL, SERUM
ALBUMIN SERPL ELPH-MCNC: 3.6 g/dL (ref 2.9–4.4)
Albumin/Glob SerPl: 1.6 (ref 0.7–1.7)
Alpha 1: 0.3 g/dL (ref 0.0–0.4)
Alpha2 Glob SerPl Elph-Mcnc: 0.8 g/dL (ref 0.4–1.0)
B-Globulin SerPl Elph-Mcnc: 0.9 g/dL (ref 0.7–1.3)
Gamma Glob SerPl Elph-Mcnc: 0.4 g/dL (ref 0.4–1.8)
Globulin, Total: 2.4 g/dL (ref 2.2–3.9)
IGA/IMMUNOGLOBULIN A, SERUM: 57 mg/dL — AB (ref 87–352)
IGM (IMMUNOGLOBIN M), SRM: 28 mg/dL (ref 26–217)
IgG, Qn, Serum: 487 mg/dL — ABNORMAL LOW (ref 700–1600)
TOTAL PROTEIN: 6 g/dL (ref 6.0–8.5)

## 2015-11-26 ENCOUNTER — Other Ambulatory Visit: Payer: Self-pay | Admitting: *Deleted

## 2015-11-26 MED ORDER — POMALIDOMIDE 3 MG PO CAPS: 3.0000 mg | ORAL_CAPSULE | Freq: Every day | ORAL | Status: DC

## 2015-11-27 ENCOUNTER — Telehealth: Payer: Self-pay | Admitting: Hematology and Oncology

## 2015-11-27 ENCOUNTER — Telehealth: Payer: Self-pay | Admitting: *Deleted

## 2015-11-27 ENCOUNTER — Ambulatory Visit (HOSPITAL_BASED_OUTPATIENT_CLINIC_OR_DEPARTMENT_OTHER): Payer: Medicare Other | Admitting: Hematology and Oncology

## 2015-11-27 ENCOUNTER — Other Ambulatory Visit: Payer: Self-pay

## 2015-11-27 VITALS — BP 187/99 | HR 73 | Temp 97.7°F | Resp 18 | Wt 154.7 lb

## 2015-11-27 DIAGNOSIS — I10 Essential (primary) hypertension: Secondary | ICD-10-CM | POA: Diagnosis not present

## 2015-11-27 DIAGNOSIS — Z720 Tobacco use: Secondary | ICD-10-CM | POA: Diagnosis not present

## 2015-11-27 DIAGNOSIS — C9002 Multiple myeloma in relapse: Secondary | ICD-10-CM

## 2015-11-27 DIAGNOSIS — E559 Vitamin D deficiency, unspecified: Secondary | ICD-10-CM | POA: Diagnosis not present

## 2015-11-27 DIAGNOSIS — Z5111 Encounter for antineoplastic chemotherapy: Secondary | ICD-10-CM

## 2015-11-27 DIAGNOSIS — M8710 Osteonecrosis due to drugs, unspecified bone: Secondary | ICD-10-CM

## 2015-11-27 MED ORDER — ALPRAZOLAM 0.25 MG PO TABS: 0.2500 mg | ORAL_TABLET | Freq: Every evening | ORAL | Status: DC | PRN

## 2015-11-27 MED ORDER — ACYCLOVIR 400 MG PO TABS: 400.0000 mg | ORAL_TABLET | Freq: Every day | ORAL | Status: DC

## 2015-11-27 NOTE — Telephone Encounter (Signed)
Per staff message and POF I have scheduled appts. Advised scheduler of appts and to move labs/flush. JMW

## 2015-11-27 NOTE — Patient Instructions (Signed)
Daratumumab injection  What is this medicine?  DARATUMUMAB (dar a toom ue mab) is a monoclonal antibody. It is used to treat multiple myeloma.  This medicine may be used for other purposes; ask your health care provider or pharmacist if you have questions.  What should I tell my health care provider before I take this medicine?  They need to know if you have any of these conditions:  -infection (especially a virus infection such as chickenpox, cold sores, or herpes)  -lung or breathing disease  -pregnant or trying to get pregnant  -breast-feeding  -an unusual or allergic reaction to daratumumab, other medicines, foods, dyes, or preservatives  How should I use this medicine?  This medicine is for infusion into a vein. It is given by a health care professional in a hospital or clinic setting.  Talk to your pediatrician regarding the use of this medicine in children. Special care may be needed.  Overdosage: If you think you have taken too much of this medicine contact a poison control center or emergency room at once.  NOTE: This medicine is only for you. Do not share this medicine with others.  What if I miss a dose?  Keep appointments for follow-up doses as directed. It is important not to miss your dose. Call your doctor or health care professional if you are unable to keep an appointment.  What may interact with this medicine?  Interactions have not been studied.  Give your health care provider a list of all the medicines, herbs, non-prescription drugs, or dietary supplements you use. Also tell them if you smoke, drink alcohol, or use illegal drugs. Some items may interact with your medicine.  This list may not describe all possible interactions. Give your health care provider a list of all the medicines, herbs, non-prescription drugs, or dietary supplements you use. Also tell them if you smoke, drink alcohol, or use illegal drugs. Some items may interact with your medicine.  What should I watch for while using  this medicine?  This drug may make you feel generally unwell. Report any side effects. Continue your course of treatment even though you feel ill unless your doctor tells you to stop.  This medicine can cause serious allergic reactions. To reduce your risk you may need to take medicine before treatment with this medicine. Take your medicine as directed.  This medicine can affect the results of blood tests to match your blood type. These changes can last for up to 6 months after the final dose. Your healthcare provider will do blood tests to match your blood type before you start treatment. Tell all of your healthcare providers that you are being treated with this medicine before receiving a blood transfusion.  This medicine can affect the results of some tests used to determine treatment response; extra tests may be needed to evaluate response.  Do not become pregnant while taking this medicine or for 3 months after stopping it. Women should inform their doctor if they wish to become pregnant or think they might be pregnant. There is a potential for serious side effects to an unborn child. Talk to your health care professional or pharmacist for more information.  What side effects may I notice from receiving this medicine?  Side effects that you should report to your doctor or health care professional as soon as possible:  -allergic reactions like skin rash, itching or hives, swelling of the face, lips, or tongue  -breathing problems  -chills  -cough  -dizziness  -  back pain -fever -joint pain -loss of appetite -tiredness This list may not describe all possible side effects. Call your doctor for medical advice about side effects. You may report side effects to FDA at  1-800-FDA-1088. Where should I keep my medicine? Keep out of the reach of children. This drug is given in a hospital or clinic and will not be stored at home. NOTE: This sheet is a summary. It may not cover all possible information. If you have questions about this medicine, talk to your doctor, pharmacist, or health care provider.    2016, [employer] Standard. (2014-12-18 17:02:23)

## 2015-11-27 NOTE — Telephone Encounter (Signed)
Lft msg for pt on cell phone and home phone concerning labs to be done today. I missed this and was advised she needs this done by this week if possible to come back today... KJ

## 2015-11-27 NOTE — Telephone Encounter (Signed)
Pt confirmed labs/ov per 01/25 POF, gave pt AVS and Calendar..... KJ, sent msg to add chemo °

## 2015-11-28 ENCOUNTER — Ambulatory Visit (HOSPITAL_COMMUNITY)
Admission: RE | Admit: 2015-11-28 | Discharge: 2015-11-28 | Disposition: A | Discharge status: Home / Self Care | Payer: Medicare Other | Source: Ambulatory Visit | Attending: Hematology and Oncology | Admitting: Hematology and Oncology

## 2015-11-28 ENCOUNTER — Encounter: Payer: Self-pay | Admitting: Hematology and Oncology

## 2015-11-28 ENCOUNTER — Telehealth: Payer: Self-pay | Admitting: Hematology and Oncology

## 2015-11-28 ENCOUNTER — Other Ambulatory Visit: Payer: Self-pay | Admitting: *Deleted

## 2015-11-28 ENCOUNTER — Other Ambulatory Visit (HOSPITAL_BASED_OUTPATIENT_CLINIC_OR_DEPARTMENT_OTHER): Payer: Medicare Other

## 2015-11-28 DIAGNOSIS — Z5111 Encounter for antineoplastic chemotherapy: Secondary | ICD-10-CM | POA: Diagnosis not present

## 2015-11-28 DIAGNOSIS — C9000 Multiple myeloma not having achieved remission: Secondary | ICD-10-CM | POA: Diagnosis present

## 2015-11-28 LAB — CBC WITH DIFFERENTIAL/PLATELET
BASO%: 0.3 % (ref 0.0–2.0)
BASOS ABS: 0 10*3/uL (ref 0.0–0.1)
EOS ABS: 0.3 10*3/uL (ref 0.0–0.5)
EOS%: 3.8 % (ref 0.0–7.0)
HCT: 39.2 % (ref 34.8–46.6)
HEMOGLOBIN: 12.4 g/dL (ref 11.6–15.9)
LYMPH%: 16.6 % (ref 14.0–49.7)
MCH: 31.1 pg (ref 25.1–34.0)
MCHC: 31.6 g/dL (ref 31.5–36.0)
MCV: 98.2 fL (ref 79.5–101.0)
MONO#: 1 10*3/uL — AB (ref 0.1–0.9)
MONO%: 15.8 % — AB (ref 0.0–14.0)
NEUT%: 63.5 % (ref 38.4–76.8)
NEUTROS ABS: 4.2 10*3/uL (ref 1.5–6.5)
PLATELETS: 245 10*3/uL (ref 145–400)
RBC: 3.99 10*6/uL (ref 3.70–5.45)
RDW: 16.1 % — AB (ref 11.2–14.5)
WBC: 6.6 10*3/uL (ref 3.9–10.3)
lymph#: 1.1 10*3/uL (ref 0.9–3.3)

## 2015-11-28 LAB — TYPE AND SCREEN
ABO/RH(D): A POS
Antibody Screen: NEGATIVE

## 2015-11-28 LAB — COMPREHENSIVE METABOLIC PANEL
ALT: 11 U/L (ref 0–55)
AST: 11 U/L (ref 5–34)
Albumin: 3.3 g/dL — ABNORMAL LOW (ref 3.5–5.0)
Alkaline Phosphatase: 92 U/L (ref 40–150)
Anion Gap: 8 mEq/L (ref 3–11)
BUN: 17.8 mg/dL (ref 7.0–26.0)
CO2: 22 meq/L (ref 22–29)
Calcium: 8.7 mg/dL (ref 8.4–10.4)
Chloride: 110 mEq/L — ABNORMAL HIGH (ref 98–109)
Creatinine: 0.7 mg/dL (ref 0.6–1.1)
GLUCOSE: 103 mg/dL (ref 70–140)
POTASSIUM: 3.8 meq/L (ref 3.5–5.1)
SODIUM: 140 meq/L (ref 136–145)
Total Bilirubin: 0.38 mg/dL (ref 0.20–1.20)
Total Protein: 5.9 g/dL — ABNORMAL LOW (ref 6.4–8.3)

## 2015-11-28 LAB — ABO/RH: ABO/RH(D): A POS

## 2015-11-28 NOTE — Assessment & Plan Note (Signed)
Her blood pressure is grossly elevated which is suspect was exacerbated by back pain. I would not adjust her medication for now until her pain appears to be under control. 

## 2015-11-28 NOTE — Assessment & Plan Note (Signed)
One of the recent, acute COPD exacerbation nearly caused respiratory failure to the point of death. She had recurrent admission to the hospital last month.  When she was sick, she quit smoking. Now that she is better, she resumed smoking again. Again, I spent some time explaining to her the importance of nicotine cessation. I encouraged her to use her nicotine patches.

## 2015-11-28 NOTE — Assessment & Plan Note (Signed)
The patient has poor dentition. She had history of ONJ She has not made an appointment to see the dentist. For that reason, she has not received Zometa lately. I continue to encourage her to make appointment to see a dentist.

## 2015-11-28 NOTE — Progress Notes (Signed)
[facility] OFFICE PROGRESS NOTE  Patient Care Team: Marin Olp, MD as PCP - General (Family Medicine) Jeanann Lewandowsky, MD as Consulting Physician (Medical Oncology) Marin Olp, MD as Consulting Physician (Family Medicine) Heath Lark, MD as Consulting Physician (Hematology and Oncology)  SUMMARY OF ONCOLOGIC HISTORY: Oncology History   Multiple myeloma, kappa light chain disease, Durie-Salmon stage III     Multiple myeloma in relapse ([place])   07/16/2006 Bone Marrow Biopsy BM biopsy is non-diagnostic   09/21/2006 Procedure L5 vertebral biopsy 5% plasma cell   10/25/2006 Bone Marrow Biopsy BM biopsy is hypercellular with 5% plasma cell   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   04/13/2011 Relapse/Recurrence Disease relapse   04/14/2011 Bone Marrow Biopsy Bm biopsy showed 2 % plasma cell   04/27/2011 Relapse/Recurrence Disease relapse, treated with Velcade/Cytoxan/Dex   09/07/2011 - 07/28/2013 Chemotherapy She has been receiving Velcade   12/27/2011 Bone Marrow Transplant 2nd transplant at [facility]   07/28/2013 Relapse/Recurrence Chemo is stopped due to progression of disease   08/29/2013 Imaging PEt/CT showed recurrence of disease with new lesion on her rib with compression fracture   09/13/2013 Bone Marrow Biopsy BM biopsy is hypercellular with 6% plasma cell   10/10/2013 - 10/20/2013 Radiation Therapy Started on palliative XRT for rib pain   11/27/2013 - 04/06/2014 Chemotherapy The patient starts chemotherapy with Carfilzomib   07/19/2014 Imaging Repeat bloodwork and PET/CT scan shows significant disease progression.   08/02/2014  - 03/06/2015 Chemotherapy She enrolled in clinical trial using combination therapy with Revlimid, dexamethasone and Elotuzumab   03/07/2015 - 06/04/2015 Chemotherapy She is on maintenance Revlimid only without dexamethasone   05/27/2015 Imaging  MRI spine showed new compression fracture.   06/04/2015 - 06/18/2015 Radiation Therapy  she received palliative radiation therapy to 25 Gy C7-T1   07/10/2015 - 08/07/2015 Chemotherapy She received Elotuzumab, dex and Revlimid. Rx is stopped due to progression   08/01/2015 Imaging MRI of the spine was performed today due to new onset of worsening right hip pain/flank area. MRI show mild progression of the L1 vertebral body.   08/21/2015 - 08/26/2015 Hospital Admission She was admitted to the hospital due to pathologic fracture to right femur   08/22/2015 Surgery She had IM nail to fractured femur   08/23/2015 - 09/20/2015 Radiation Therapy She received XRT to her L1 L2 and Right Femur   09/05/2015 - 09/06/2015 Hospital Admission She had recurrent admission due to displaced fracture, managed conservatively   09/12/2015 - 09/17/2015 Hospital Admission She had recurrent admission for COPD exacerbation   09/30/2015 -  Chemotherapy She was started on Pomalyst    INTERVAL HISTORY: Please see below for problem oriented charting. She returns for further follow-up. She denies recent infection. Her headaches and bone pain appeared to be better controlled with dexamethasone. She continues to smoke. The patient denies any recent signs or symptoms of bleeding such as spontaneous epistaxis, hematuria or hematochezia.   REVIEW OF SYSTEMS:   Constitutional: Denies fevers, chills or abnormal weight loss Eyes: Denies blurriness of vision Ears, nose, mouth, throat, and face: Denies mucositis or sore throat Respiratory: Denies cough, dyspnea or wheezes Cardiovascular: Denies palpitation, chest discomfort  or lower extremity swelling Gastrointestinal:  Denies nausea, heartburn or  change in bowel habits Skin: Denies abnormal skin rashes Lymphatics: Denies new lymphadenopathy or easy bruising Neurological:Denies numbness, tingling or new weaknesses Behavioral/Psych: Mood is stable, no new changes  All other systems were reviewed with the patient and are negative.  I have reviewed the past medical history, past surgical history, social history and family history with the patient and they are unchanged from previous note.  ALLERGIES:  is allergic to codeine; ibuprofen; and albuterol.  MEDICATIONS:  Current Outpatient Prescriptions  Medication Sig Dispense Refill  . ALPRAZolam (XANAX) 0.25 MG tablet Take 1 tablet (0.25 mg total) by mouth at bedtime as needed for anxiety. 60 tablet 0  . aspirin 81 MG tablet Take 81 mg by mouth daily.    . Cholecalciferol (VITAMIN D3) 5000 UNITS CAPS Take 5,000 Units by mouth daily.     . cyclobenzaprine (FLEXERIL) 10 MG tablet Take 1 tablet (10 mg total) by mouth 3 (three) times daily as needed for muscle spasms. 15 tablet 0  . dexamethasone (DECADRON) 4 MG tablet Take 1 tablet (4 mg total) by mouth daily. 30 tablet 1  . ipratropium (ATROVENT HFA) 17 MCG/ACT inhaler Inhale 2 puffs into the lungs every 4 (four) hours as needed for wheezing. 1 Inhaler 12  . levothyroxine (SYNTHROID, LEVOTHROID) 50 MCG tablet TAKE 1 TABLET (50 MCG TOTAL) BY MOUTH DAILY BEFORE BREAKFAST. 90 tablet 1  . losartan (COZAAR) 50 MG tablet Take 1 tablet (50 mg total) by mouth daily. 90 tablet 1  . magic mouthwash SOLN Take 10 mLs by mouth 4 (four) times daily as needed for mouth pain. Swish and spit  Or  Swish and swallow. 480 mL 2  . morphine (MS CONTIN) 100 MG 12 hr tablet Take 100 mg by mouth 3 (three) times daily.    Marland Kitchen morphine (MSIR) 30 MG tablet Take 1-1.5 tablets (30-45 mg total) by mouth every 2 (two) hours as needed for moderate pain or severe pain. (Patient taking differently: Take 30-45 mg by mouth every 2 (two) hours as needed for moderate pain or severe  pain. Take one tablet every 2 hours as needed for moderate pain) 30 tablet 0  . nicotine (NICODERM CQ - DOSED IN MG/24 HOURS) 14 mg/24hr patch Place 1 patch (14 mg total) onto the skin daily. 28 patch 0  . pomalidomide (POMALYST) 3 MG capsule Take 1 capsule (3 mg total) by mouth daily. Take with water on days 1-21. Repeat every 28 days. 21 capsule 0  . senna-docusate (SENOKOT-S) 8.6-50 MG tablet Take 1 tablet by mouth daily.    Marland Kitchen acyclovir (ZOVIRAX) 400 MG tablet Take 1 tablet (400 mg total) by mouth daily. 30 tablet 6  . [DISCONTINUED] amLODipine (NORVASC) 5 MG tablet Take 1 tablet (5 mg total) by mouth daily. 90 tablet 3  . [DISCONTINUED] gabapentin (NEURONTIN) 600 MG tablet Take 600 mg by mouth 3 (three) times daily.       No current facility-administered medications for this visit.    PHYSICAL EXAMINATION: ECOG PERFORMANCE STATUS: 1 - Symptomatic but completely ambulatory  Filed Vitals:   11/27/15 1200  BP: 187/99  Pulse: 73  Temp: 97.7 F (36.5 C)  Resp: 18   Filed Weights   11/27/15 1200  Weight: 154 lb 11.2 oz (70.171 kg)    GENERAL:alert, no distress and comfortable SKIN: skin color, texture, turgor are normal, no rashes or significant lesions EYES: normal, Conjunctiva are pink and non-injected, sclera clear  OROPHARYNX:no exudate, no erythema and lips, buccal mucosa, and tongue normal  NECK: supple, thyroid normal size, non-tender, without nodularity LYMPH:  no palpable lymphadenopathy in the cervical, axillary or inguinal LUNGS: clear to auscultation and percussion with normal breathing effort HEART: regular rate & rhythm and no murmurs and no lower extremity edema ABDOMEN:abdomen soft, non-tender and normal bowel sounds Musculoskeletal:no cyanosis of digits and no clubbing  NEURO: alert & oriented x 3 with fluent speech, no focal motor/sensory deficits  LABORATORY DATA:  I have reviewed the data as listed    Component Value Date/Time   NA 141 11/20/2015 1146   NA  141 09/11/2015 2255   K 4.4 11/20/2015 1146   K 4.3 09/11/2015 2255   CL 107 09/11/2015 2255   CL 107 03/10/2013 1014   CO2 23 11/20/2015 1146   CO2 27 09/11/2015 2255   GLUCOSE 135 11/20/2015 1146   GLUCOSE 106* 09/11/2015 2255   GLUCOSE 111* 03/10/2013 1014   GLUCOSE 98 08/31/2006 1024   BUN 12.4 11/20/2015 1146   BUN 16 09/11/2015 2255   CREATININE 0.8 11/20/2015 1146   CREATININE 0.72 09/12/2015 0620   CALCIUM 9.0 11/20/2015 1146   CALCIUM 9.1 09/11/2015 2255   PROT 5.9* 11/20/2015 1146   PROT 6.0 11/20/2015 1146   PROT 6.6 09/11/2014 0336   ALBUMIN 3.4* 11/20/2015 1146   ALBUMIN 3.4* 09/11/2014 0336   AST 14 11/20/2015 1146   AST 18 09/11/2014 0336   ALT 11 11/20/2015 1146   ALT 18 09/11/2014 0336   ALKPHOS 92 11/20/2015 1146   ALKPHOS 113 09/11/2014 0336   BILITOT 0.47 11/20/2015 1146   BILITOT 0.6 09/11/2014 0336   GFRNONAA >60 09/12/2015 0620   GFRAA >60 09/12/2015 0620    No results found for: SPEP, UPEP  Lab Results  Component Value Date   WBC 5.6 11/20/2015   NEUTROABS 3.2 11/20/2015   HGB 12.0 11/20/2015   HCT 37.3 11/20/2015   MCV 96.3 11/20/2015   PLT 221 11/20/2015      Chemistry      Component Value Date/Time   NA 141 11/20/2015 1146   NA 141 09/11/2015 2255   K 4.4 11/20/2015 1146   K 4.3 09/11/2015 2255   CL 107 09/11/2015 2255   CL 107 03/10/2013 1014   CO2 23 11/20/2015 1146   CO2 27 09/11/2015 2255   BUN 12.4 11/20/2015 1146   BUN 16 09/11/2015 2255   CREATININE 0.8 11/20/2015 1146   CREATININE 0.72 09/12/2015 0620      Component Value Date/Time   CALCIUM 9.0 11/20/2015 1146   CALCIUM 9.1 09/11/2015 2255   ALKPHOS 92 11/20/2015 1146   ALKPHOS 113 09/11/2014 0336   AST 14 11/20/2015 1146   AST 18 09/11/2014 0336   ALT 11 11/20/2015 1146   ALT 18 09/11/2014 0336   BILITOT 0.47 11/20/2015 1146   BILITOT 0.6 09/11/2014 0336      ASSESSMENT & PLAN:  Multiple myeloma in relapse ([place]) I reviewed the most recent blood work  with her. Her serum light chain is stable. She has no recurrence of admission to the hospital. Her pain appears to be under control. Recently, she has severe headache, resolved with daily dose of dexamethasone. I will start to initiate dexamethasone taper this week. Per recommendation from [place], we recommend her to begin additional treatment with Daratumumab I discussed with her the risk, benefit, side effects of Daratumumab and she agreed to proceed. She will begin treatment next week. I  will see her within 2 weeks for assessment and risk of side effects.  Vitamin D deficiency She has severe vitamin D deficiency despite on high-dose vitamin D replacement therapy. I will start her on prescription strength vitamin D to take on a weekly basis.    Essential hypertension Her blood pressure is grossly elevated which is suspect was exacerbated by back pain. I would not adjust her medication for now until her pain appears to be under control.    Tobacco abuse One of the recent, acute COPD exacerbation nearly caused respiratory failure to the point of death. She had recurrent admission to the hospital last month.  When she was sick, she quit smoking. Now that she is better, she resumed smoking again. Again, I spent some time explaining to her the importance of nicotine cessation. I encouraged her to use her nicotine patches.  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 She had history of ONJ She has not made an appointment to see the dentist. For that reason, she has not received Zometa lately. I continue to encourage her to make appointment to see a dentist.     Orders Placed This Encounter  Procedures  . CBC with Differential    Standing Status: Standing     Number of Occurrences: 20     Standing Expiration Date: 11/27/2016  . Comprehensive metabolic panel    Standing Status: Standing     Number of Occurrences: 20     Standing Expiration Date: 11/27/2016  .  PHYSICIAN COMMUNICATION ORDER    Type and Screen patients prior to starting daratumumab. Administration of daratumumab results in a positive Indirect Antiglobulin Test ([place]oombs test) for up to 6 months after the last infusion. Notify blood transfusion centers of this interference with serological testing and inform blood banks that a patient has recevied daratumumab  . Type and screen    Standing Status: Future     Number of Occurrences:      Standing Expiration Date: 11/26/2016   All questions were answered. The patient knows to call the clinic with any problems, questions or concerns. No barriers to learning was detected. I spent 30 minutes counseling the patient face to face. The total time spent in the appointment was 40 minutes and more than 50% was on counseling and review of test results     [facility], [place], MD 11/28/2015 8:29 AM

## 2015-11-28 NOTE — Telephone Encounter (Signed)
Lft msg for pt confirming updated schedule and also mailed out schedule to pt... KJ

## 2015-11-28 NOTE — Assessment & Plan Note (Signed)
I reviewed the most recent blood work with her. Her serum light chain is stable. She has no recurrence of admission to the hospital. Her pain appears to be under control. Recently, she has severe headache, resolved with daily dose of dexamethasone. I will start to initiate dexamethasone taper this week. Per recommendation from [place], we recommend her to begin additional treatment with Daratumumab I discussed with her the risk, benefit, side effects of Daratumumab and she agreed to proceed. She will begin treatment next week. I will see her within 2 weeks for assessment and risk of side effects.

## 2015-11-28 NOTE — Assessment & Plan Note (Signed)
She has severe vitamin D deficiency despite on high-dose vitamin D replacement therapy. I will start her on prescription strength vitamin D to take on a weekly basis. 

## 2015-12-02 ENCOUNTER — Encounter: Payer: Self-pay | Admitting: Hematology and Oncology

## 2015-12-02 NOTE — Progress Notes (Signed)
Per [employer] 11/29/15 pomaylst will be shipped via fedex

## 2015-12-03 ENCOUNTER — Encounter: Payer: Self-pay | Admitting: Family Medicine

## 2015-12-03 ENCOUNTER — Ambulatory Visit (INDEPENDENT_AMBULATORY_CARE_PROVIDER_SITE_OTHER): Payer: Medicare Other | Admitting: Family Medicine

## 2015-12-03 VITALS — BP 160/90 | HR 77 | Temp 98.4°F | Wt 153.0 lb

## 2015-12-03 DIAGNOSIS — J449 Chronic obstructive pulmonary disease, unspecified: Secondary | ICD-10-CM | POA: Diagnosis not present

## 2015-12-03 MED ORDER — TIOTROPIUM BROMIDE MONOHYDRATE 18 MCG IN CAPS: 18.0000 ug | ORAL_CAPSULE | Freq: Every day | RESPIRATORY_TRACT | Status: DC

## 2015-12-03 NOTE — Assessment & Plan Note (Signed)
S: Hospitalized last November for COPD exacerbation and initially required bipap due to severity. Had previously seen Dr. Melvyn Novas in 2015 and PFTs were suggested but never completed. Has not followed up with pulmonary since that time. She states since discharge her breathing has never returned to normal. She seems to get short of breath with more than minimal activity.She has noted some wheeze at time. Her symptoms improve with atrovent but she finds herself having to use it multiple times a day.  A/P: COPD is poorly symptomatically controlled. We will add spiriva. Considered change to combivent but ultimately did not. Have referred to pulmonary as well to get her reestablished. Of note, continues to smoke 5-6 cigarettes per day (helps her cope as dealing with multiple myeloma)-strongly encouraged complete cessation.

## 2015-12-03 NOTE — Patient Instructions (Signed)
Start spiriva. Can continue atrovent as needed to help with shortness of breath as may take 1-2 months for spiriva to really kick in.   We will call you within a week about your referral to pulmonology/lung doctors. If you do not hear within 2 weeks, give [place] a call.   If you have worsening of breathing symptoms please see me immediately

## 2015-12-03 NOTE — Progress Notes (Signed)
[name], MD  Subjective:  Kendra Johns is a 65 y.o. year old very pleasant female patient who presents with:  See problem oriented charting ROS- no headache or lightheadedness  Past Medical History- multiple myeloma with pathological femur fracture, s/p bone marrow transplant, COPD, thrombotytopenis, hypothyroidism, hyperlipidemia, hypertension  Medications- reviewed and updated Current Outpatient Prescriptions  Medication Sig Dispense Refill  . acyclovir (ZOVIRAX) 400 MG tablet Take 1 tablet (400 mg total) by mouth daily. 30 tablet 6  . aspirin 81 MG tablet Take 81 mg by mouth daily.    . Cholecalciferol (VITAMIN D3) 5000 UNITS CAPS Take 5,000 Units by mouth daily.     Marland Kitchen dexamethasone (DECADRON) 4 MG tablet Take 1 tablet (4 mg total) by mouth daily. 30 tablet 1  . ipratropium (ATROVENT HFA) 17 MCG/ACT inhaler Inhale 2 puffs into the lungs every 4 (four) hours as needed for wheezing. 1 Inhaler 12  . levothyroxine (SYNTHROID, LEVOTHROID) 50 MCG tablet TAKE 1 TABLET (50 MCG TOTAL) BY MOUTH DAILY BEFORE BREAKFAST. 90 tablet 1  . losartan (COZAAR) 50 MG tablet Take 1 tablet (50 mg total) by mouth daily. 90 tablet 1  . morphine (MS CONTIN) 100 MG 12 hr tablet Take 100 mg by mouth 3 (three) times daily.    Marland Kitchen morphine (MSIR) 30 MG tablet Take 1-1.5 tablets (30-45 mg total) by mouth every 2 (two) hours as needed for moderate pain or severe pain. (Patient taking differently: Take 30-45 mg by mouth every 2 (two) hours as needed for moderate pain or severe pain. Take one tablet every 2 hours as needed for moderate pain) 30 tablet 0  . nicotine (NICODERM CQ - DOSED IN MG/24 HOURS) 14 mg/24hr patch Place 1 patch (14 mg total) onto the skin daily. 28 patch 0  . pomalidomide (POMALYST) 3 MG capsule Take 1 capsule (3 mg total) by mouth daily. Take with water on days 1-21. Repeat every 28 days. 21 capsule 0  . senna-docusate (SENOKOT-S) 8.6-50 MG tablet Take 1 tablet by mouth daily.    Marland Kitchen ALPRAZolam  (XANAX) 0.25 MG tablet Take 1 tablet (0.25 mg total) by mouth at bedtime as needed for anxiety. (Patient not taking: Reported on 12/03/2015) 60 tablet 0  . cyclobenzaprine (FLEXERIL) 10 MG tablet Take 1 tablet (10 mg total) by mouth 3 (three) times daily as needed for muscle spasms. (Patient not taking: Reported on 12/03/2015) 15 tablet 0  . magic mouthwash SOLN Take 10 mLs by mouth 4 (four) times daily as needed for mouth pain. Swish and spit  Or  Swish and swallow. (Patient not taking: Reported on 12/03/2015) 480 mL 2  . tiotropium (SPIRIVA HANDIHALER) 18 MCG inhalation capsule Place 1 capsule (18 mcg total) into inhaler and inhale daily. 30 capsule 12  . [DISCONTINUED] amLODipine (NORVASC) 5 MG tablet Take 1 tablet (5 mg total) by mouth daily. 90 tablet 3  . [DISCONTINUED] gabapentin (NEURONTIN) 600 MG tablet Take 600 mg by mouth 3 (three) times daily.       No current facility-administered medications for this visit.    Objective: BP 160/90 mmHg  Pulse 77  Temp(Src) 98.4 F (36.9 C)  Wt 153 lb (69.4 kg)  SpO2 96% Gen: NAD, resting comfortably CV: RRR no murmurs rubs or gallops Lungs: diffuse wheeze, prolonged expiratory phase.  no crackles Abdomen: soft/nontender/nondistended/normal bowel sounds.   Ext: no edema Skin: warm, dry Neuro: grossly normal, moves all extremities, walks with cane  Assessment/Plan:  COPD (chronic obstructive pulmonary disease) (HCC)  S: Hospitalized last November for COPD exacerbation and initially required bipap due to severity. Had previously seen Dr. Wert in 2015 and PFTs were suggested but never completed. Has not followed up with pulmonary since that time. She states since discharge her breathing has never returned to normal. She seems to get short of breath with more than minimal activity.She has noted some wheeze at time. Her symptoms improve with atrovent but she finds herself having to use it multiple times a day.  A/P: COPD is poorly symptomatically  controlled. We will add spiriva. Considered change to combivent but ultimately did not. Have referred to pulmonary as well to get her reestablished. Of note, continues to smoke 5-6 cigarettes per day (helps her cope as dealing with multiple myeloma)-strongly encouraged complete cessation.   BP noted to be elevated- is having some pain today. Early January was controlled at time when pain was controlled- we will trend for now with no change to medication.  BP Readings from Last 3 Encounters:  12/03/15 160/90  11/27/15 187/99  11/05/15 129/89   Return precautions advised.   Orders Placed This Encounter  Procedures  . Ambulatory referral to Pulmonology    Referral Priority:  Routine    Referral Type:  Consultation    Referral Reason:  Specialty Services Required    Requested Specialty:  Pulmonary Disease    Number of Visits Requested:  1    Meds ordered this encounter  Medications  . tiotropium (SPIRIVA HANDIHALER) 18 MCG inhalation capsule    Sig: Place 1 capsule (18 mcg total) into inhaler and inhale daily.    Dispense:  30 capsule    Refill:  12     

## 2015-12-04 ENCOUNTER — Other Ambulatory Visit: Payer: Self-pay | Admitting: *Deleted

## 2015-12-04 DIAGNOSIS — Z79891 Long term (current) use of opiate analgesic: Secondary | ICD-10-CM | POA: Diagnosis not present

## 2015-12-04 DIAGNOSIS — M545 Low back pain: Secondary | ICD-10-CM | POA: Diagnosis not present

## 2015-12-04 DIAGNOSIS — K5900 Constipation, unspecified: Secondary | ICD-10-CM | POA: Diagnosis not present

## 2015-12-04 DIAGNOSIS — G894 Chronic pain syndrome: Secondary | ICD-10-CM | POA: Diagnosis not present

## 2015-12-09 ENCOUNTER — Other Ambulatory Visit: Payer: Self-pay | Admitting: Hematology and Oncology

## 2015-12-10 ENCOUNTER — Ambulatory Visit (HOSPITAL_BASED_OUTPATIENT_CLINIC_OR_DEPARTMENT_OTHER): Payer: Medicare Other

## 2015-12-10 ENCOUNTER — Other Ambulatory Visit: Payer: Self-pay

## 2015-12-10 ENCOUNTER — Other Ambulatory Visit (HOSPITAL_BASED_OUTPATIENT_CLINIC_OR_DEPARTMENT_OTHER): Payer: Medicare Other

## 2015-12-10 VITALS — BP 151/76 | HR 66 | Temp 97.9°F | Resp 18

## 2015-12-10 DIAGNOSIS — C9002 Multiple myeloma in relapse: Secondary | ICD-10-CM | POA: Diagnosis present

## 2015-12-10 DIAGNOSIS — Z5112 Encounter for antineoplastic immunotherapy: Secondary | ICD-10-CM | POA: Diagnosis present

## 2015-12-10 LAB — CBC WITH DIFFERENTIAL/PLATELET
BASO%: 1.7 % (ref 0.0–2.0)
BASOS ABS: 0.1 10*3/uL (ref 0.0–0.1)
EOS ABS: 0.5 10*3/uL (ref 0.0–0.5)
EOS%: 6 % (ref 0.0–7.0)
HCT: 40 % (ref 34.8–46.6)
HGB: 13.2 g/dL (ref 11.6–15.9)
LYMPH%: 9.6 % — AB (ref 14.0–49.7)
MCH: 31.2 pg (ref 25.1–34.0)
MCHC: 32.9 g/dL (ref 31.5–36.0)
MCV: 95 fL (ref 79.5–101.0)
MONO#: 0.9 10*3/uL (ref 0.1–0.9)
MONO%: 12.4 % (ref 0.0–14.0)
NEUT#: 5.3 10*3/uL (ref 1.5–6.5)
NEUT%: 70.3 % (ref 38.4–76.8)
Platelets: 193 10*3/uL (ref 145–400)
RBC: 4.22 10*6/uL (ref 3.70–5.45)
RDW: 17.3 % — ABNORMAL HIGH (ref 11.2–14.5)
WBC: 7.6 10*3/uL (ref 3.9–10.3)
lymph#: 0.7 10*3/uL — ABNORMAL LOW (ref 0.9–3.3)

## 2015-12-10 LAB — COMPREHENSIVE METABOLIC PANEL
ALK PHOS: 105 U/L (ref 40–150)
ALT: 20 U/L (ref 0–55)
ANION GAP: 11 meq/L (ref 3–11)
AST: 17 U/L (ref 5–34)
Albumin: 3.4 g/dL — ABNORMAL LOW (ref 3.5–5.0)
BUN: 17.7 mg/dL (ref 7.0–26.0)
CO2: 21 meq/L — AB (ref 22–29)
Calcium: 9.1 mg/dL (ref 8.4–10.4)
Chloride: 108 mEq/L (ref 98–109)
Creatinine: 0.7 mg/dL (ref 0.6–1.1)
GLUCOSE: 112 mg/dL (ref 70–140)
POTASSIUM: 3.8 meq/L (ref 3.5–5.1)
SODIUM: 140 meq/L (ref 136–145)
Total Bilirubin: 0.4 mg/dL (ref 0.20–1.20)
Total Protein: 6.3 g/dL — ABNORMAL LOW (ref 6.4–8.3)

## 2015-12-10 MED ORDER — ACETAMINOPHEN 325 MG PO TABS
650.0000 mg | ORAL_TABLET | Freq: Once | ORAL | Status: AC
Start: 2015-12-10 — End: 2015-12-10
  Administered 2015-12-10: 650 mg via ORAL

## 2015-12-10 MED ORDER — METHYLPREDNISOLONE SODIUM SUCC 125 MG IJ SOLR
125.0000 mg | Freq: Once | INTRAMUSCULAR | Status: AC
  Administered 2015-12-10: 125 mg via INTRAVENOUS

## 2015-12-10 MED ORDER — SODIUM CHLORIDE 0.9 % IV SOLN
Freq: Once | INTRAVENOUS | Status: AC
  Administered 2015-12-10: 09:00:00 via INTRAVENOUS

## 2015-12-10 MED ORDER — SODIUM CHLORIDE 0.9 % IV SOLN
17.0000 mg/kg | Freq: Once | INTRAVENOUS | Status: AC
  Administered 2015-12-10: 1200 mg via INTRAVENOUS
  Filled 2015-12-10: qty 60

## 2015-12-10 MED ORDER — DIPHENHYDRAMINE HCL 25 MG PO CAPS
ORAL_CAPSULE | ORAL | Status: AC
  Filled 2015-12-10: qty 2

## 2015-12-10 MED ORDER — HEPARIN SOD (PORK) LOCK FLUSH 100 UNIT/ML IV SOLN
500.0000 [IU] | Freq: Once | INTRAVENOUS | Status: AC | PRN
  Administered 2015-12-10: 500 [IU]
  Filled 2015-12-10: qty 5

## 2015-12-10 MED ORDER — PROCHLORPERAZINE MALEATE 10 MG PO TABS
ORAL_TABLET | ORAL | Status: AC
  Filled 2015-12-10: qty 1

## 2015-12-10 MED ORDER — METHYLPREDNISOLONE SODIUM SUCC 125 MG IJ SOLR
INTRAMUSCULAR | Status: AC
  Filled 2015-12-10: qty 2

## 2015-12-10 MED ORDER — MONTELUKAST SODIUM 10 MG PO TABS
10.0000 mg | ORAL_TABLET | Freq: Once | ORAL | Status: AC
  Administered 2015-12-10: 10 mg via ORAL
  Filled 2015-12-10: qty 1

## 2015-12-10 MED ORDER — SODIUM CHLORIDE 0.9% FLUSH
10.0000 mL | INTRAVENOUS | Status: DC | PRN
  Administered 2015-12-10: 10 mL
  Filled 2015-12-10: qty 10

## 2015-12-10 MED ORDER — PROCHLORPERAZINE MALEATE 10 MG PO TABS
10.0000 mg | ORAL_TABLET | Freq: Once | ORAL | Status: AC
  Administered 2015-12-10: 10 mg via ORAL

## 2015-12-10 MED ORDER — DIPHENHYDRAMINE HCL 25 MG PO CAPS
50.0000 mg | ORAL_CAPSULE | Freq: Once | ORAL | Status: AC
  Administered 2015-12-10: 50 mg via ORAL

## 2015-12-10 MED ORDER — ACETAMINOPHEN 325 MG PO TABS
ORAL_TABLET | ORAL | Status: AC
  Filled 2015-12-10: qty 2

## 2015-12-10 NOTE — Patient Instructions (Signed)
[facility] Discharge Instructions for Patients Receiving Chemotherapy  Today you received the following chemotherapy agents darzalex  To help prevent nausea and vomiting after your treatment, we encourage you to take your nausea medication as directed   If you develop nausea and vomiting that is not controlled by your nausea medication, call the clinic.   BELOW ARE SYMPTOMS THAT SHOULD BE REPORTED IMMEDIATELY:  *FEVER GREATER THAN 100.5 F  *CHILLS WITH OR WITHOUT FEVER  NAUSEA AND VOMITING THAT IS NOT CONTROLLED WITH YOUR NAUSEA MEDICATION  *UNUSUAL SHORTNESS OF BREATH  *UNUSUAL BRUISING OR BLEEDING  TENDERNESS IN MOUTH AND THROAT WITH OR WITHOUT PRESENCE OF ULCERS  *URINARY PROBLEMS  *BOWEL PROBLEMS  UNUSUAL RASH Items with * indicate a potential emergency and should be followed up as soon as possible.  Feel free to call the clinic you have any questions or concerns. The clinic phone number is [phone].  Daratumumab injection What is this medicine? DARATUMUMAB (dar a toom ue mab) is a monoclonal antibody. It is used to treat multiple myeloma. This medicine may be used for other purposes; ask your health care provider or pharmacist if you have questions. What should I tell my health care provider before I take this medicine? They need to know if you have any of these conditions: -infection (especially a virus infection such as chickenpox, cold sores, or herpes) -lung or breathing disease -pregnant or trying to get pregnant -breast-feeding -an unusual or allergic reaction to daratumumab, other medicines, foods, dyes, or preservatives How should I use this medicine? This medicine is for infusion into a vein. It is given by a health care professional in a hospital or clinic setting. Talk to your pediatrician regarding the use of this medicine in children. Special care may be needed. Overdosage: If you think you have taken too much of this medicine  contact a poison control center or emergency room at once. NOTE: This medicine is only for you. Do not share this medicine with others. What if I miss a dose? Keep appointments for follow-up doses as directed. It is important not to miss your dose. Call your doctor or health care professional if you are unable to keep an appointment. What may interact with this medicine? Interactions have not been studied. Give your health care provider a list of all the medicines, herbs, non-prescription drugs, or dietary supplements you use. Also tell them if you smoke, drink alcohol, or use illegal drugs. Some items may interact with your medicine. This list may not describe all possible interactions. Give your health care provider a list of all the medicines, herbs, non-prescription drugs, or dietary supplements you use. Also tell them if you smoke, drink alcohol, or use illegal drugs. Some items may interact with your medicine. What should I watch for while using this medicine? This drug may make you feel generally unwell. Report any side effects. Continue your course of treatment even though you feel ill unless your doctor tells you to stop. This medicine can cause serious allergic reactions. To reduce your risk you may need to take medicine before treatment with this medicine. Take your medicine as directed. This medicine can affect the results of blood tests to match your blood type. These changes can last for up to 6 months after the final dose. Your healthcare provider will do blood tests to match your blood type before you start treatment. Tell all of your healthcare providers that you are being treated with this medicine before receiving a  blood transfusion. This medicine can affect the results of some tests used to determine treatment response; extra tests may be needed to evaluate response. Do not become pregnant while taking this medicine or for 3 months after stopping it. Women should inform their doctor  if they wish to become pregnant or think they might be pregnant. There is a potential for serious side effects to an unborn child. Talk to your health care professional or pharmacist for more information. What side effects may I notice from receiving this medicine? Side effects that you should report to your doctor or health care professional as soon as possible: -allergic reactions like skin rash, itching or hives, swelling of the face, lips, or tongue -breathing problems -chills -cough -dizziness -feeling faint or lightheaded -headache -nausea, vomiting -shortness of breath Side effects that usually do not require medical attention (Report these to your doctor or health care professional if they continue or are bothersome.): -back pain -fever -joint pain -loss of appetite -tiredness This list may not describe all possible side effects. Call your doctor for medical advice about side effects. You may report side effects to FDA at 1-800-FDA-1088. Where should I keep my medicine? Keep out of the reach of children. This drug is given in a hospital or clinic and will not be stored at home. NOTE: This sheet is a summary. It may not cover all possible information. If you have questions about this medicine, talk to your doctor, pharmacist, or health care provider.    2016, [employer]vier/Gold Standard. (2014-12-18 17:02:23)

## 2015-12-12 ENCOUNTER — Telehealth: Payer: Self-pay | Admitting: *Deleted

## 2015-12-12 NOTE — Telephone Encounter (Signed)
Left message to see how she did with first chemo. To call if having any problems.

## 2015-12-12 NOTE — Telephone Encounter (Signed)
-----   Message from Arty Baumgartner, RN sent at 12/10/2015  5:02 PM EST ----- Regarding: Alvy Bimler, first time chemo First time Darzalex.  Pt tolerated well.  [phone]

## 2015-12-16 ENCOUNTER — Encounter: Payer: Self-pay | Admitting: Pharmacist

## 2015-12-17 ENCOUNTER — Ambulatory Visit (HOSPITAL_BASED_OUTPATIENT_CLINIC_OR_DEPARTMENT_OTHER): Payer: Medicare Other | Admitting: Hematology and Oncology

## 2015-12-17 ENCOUNTER — Telehealth: Payer: Self-pay | Admitting: Hematology and Oncology

## 2015-12-17 ENCOUNTER — Ambulatory Visit: Payer: Medicare Other

## 2015-12-17 ENCOUNTER — Telehealth: Payer: Self-pay | Admitting: *Deleted

## 2015-12-17 ENCOUNTER — Ambulatory Visit (HOSPITAL_BASED_OUTPATIENT_CLINIC_OR_DEPARTMENT_OTHER): Payer: Medicare Other

## 2015-12-17 ENCOUNTER — Other Ambulatory Visit (HOSPITAL_BASED_OUTPATIENT_CLINIC_OR_DEPARTMENT_OTHER): Payer: Medicare Other

## 2015-12-17 ENCOUNTER — Other Ambulatory Visit: Payer: Self-pay

## 2015-12-17 VITALS — BP 138/90 | HR 78 | Temp 98.2°F | Resp 18

## 2015-12-17 VITALS — BP 161/89 | HR 75 | Temp 97.8°F | Resp 18 | Ht 64.0 in | Wt 153.2 lb

## 2015-12-17 DIAGNOSIS — C9002 Multiple myeloma in relapse: Secondary | ICD-10-CM

## 2015-12-17 DIAGNOSIS — Z720 Tobacco use: Secondary | ICD-10-CM

## 2015-12-17 DIAGNOSIS — T451X5A Adverse effect of antineoplastic and immunosuppressive drugs, initial encounter: Secondary | ICD-10-CM

## 2015-12-17 DIAGNOSIS — C9000 Multiple myeloma not having achieved remission: Secondary | ICD-10-CM

## 2015-12-17 DIAGNOSIS — M898X9 Other specified disorders of bone, unspecified site: Secondary | ICD-10-CM | POA: Diagnosis not present

## 2015-12-17 DIAGNOSIS — D701 Agranulocytosis secondary to cancer chemotherapy: Secondary | ICD-10-CM | POA: Diagnosis not present

## 2015-12-17 DIAGNOSIS — D702 Other drug-induced agranulocytosis: Secondary | ICD-10-CM | POA: Insufficient documentation

## 2015-12-17 DIAGNOSIS — G8929 Other chronic pain: Secondary | ICD-10-CM | POA: Diagnosis not present

## 2015-12-17 DIAGNOSIS — Z5112 Encounter for antineoplastic immunotherapy: Secondary | ICD-10-CM

## 2015-12-17 LAB — COMPREHENSIVE METABOLIC PANEL WITH GFR
ALT: 23 U/L (ref 0–55)
AST: 14 U/L (ref 5–34)
Albumin: 3.2 g/dL — ABNORMAL LOW (ref 3.5–5.0)
Alkaline Phosphatase: 93 U/L (ref 40–150)
Anion Gap: 10 meq/L (ref 3–11)
BUN: 19.3 mg/dL (ref 7.0–26.0)
CO2: 22 meq/L (ref 22–29)
Calcium: 9.2 mg/dL (ref 8.4–10.4)
Chloride: 108 meq/L (ref 98–109)
Creatinine: 0.8 mg/dL (ref 0.6–1.1)
EGFR: >90 ml/min/1.73 m2 (ref >90)
Glucose: 103 mg/dL (ref 70–140)
Potassium: 3.5 meq/L (ref 3.5–5.1)
Sodium: 140 meq/L (ref 136–145)
Total Bilirubin: <0.3 mg/dL (ref 0.20–1.20)
Total Protein: 5.9 g/dL — ABNORMAL LOW (ref 6.4–8.3)

## 2015-12-17 LAB — CBC WITH DIFFERENTIAL/PLATELET
BASO%: 1 % (ref 0.0–2.0)
BASOS ABS: 0 10*3/uL (ref 0.0–0.1)
EOS%: 12.8 % — ABNORMAL HIGH (ref 0.0–7.0)
Eosinophils Absolute: 0.4 10*3/uL (ref 0.0–0.5)
HEMATOCRIT: 36.8 % (ref 34.8–46.6)
HEMOGLOBIN: 12.1 g/dL (ref 11.6–15.9)
LYMPH#: 0.5 10*3/uL — AB (ref 0.9–3.3)
LYMPH%: 16 % (ref 14.0–49.7)
MCH: 30.6 pg (ref 25.1–34.0)
MCHC: 32.8 g/dL (ref 31.5–36.0)
MCV: 93.2 fL (ref 79.5–101.0)
MONO#: 0.8 10*3/uL (ref 0.1–0.9)
MONO%: 24.2 % — ABNORMAL HIGH (ref 0.0–14.0)
NEUT#: 1.5 10*3/uL (ref 1.5–6.5)
NEUT%: 46 % (ref 38.4–76.8)
Platelets: 173 10*3/uL (ref 145–400)
RBC: 3.95 10*6/uL (ref 3.70–5.45)
RDW: 17 % — AB (ref 11.2–14.5)
WBC: 3.3 10*3/uL — ABNORMAL LOW (ref 3.9–10.3)

## 2015-12-17 MED ORDER — DIPHENHYDRAMINE HCL 25 MG PO CAPS
ORAL_CAPSULE | ORAL | Status: AC
  Filled 2015-12-17: qty 2

## 2015-12-17 MED ORDER — DARATUMUMAB CHEMO INJECTION 400 MG/20ML
17.0000 mg/kg | Freq: Once | INTRAVENOUS | Status: AC
  Administered 2015-12-17: 1200 mg via INTRAVENOUS
  Filled 2015-12-17: qty 60

## 2015-12-17 MED ORDER — PROCHLORPERAZINE MALEATE 10 MG PO TABS
10.0000 mg | ORAL_TABLET | Freq: Once | ORAL | Status: AC
  Administered 2015-12-17: 10 mg via ORAL

## 2015-12-17 MED ORDER — SODIUM CHLORIDE 0.9% FLUSH
10.0000 mL | INTRAVENOUS | Status: DC | PRN
  Administered 2015-12-17: 10 mL
  Filled 2015-12-17: qty 10

## 2015-12-17 MED ORDER — ACETAMINOPHEN 325 MG PO TABS
650.0000 mg | ORAL_TABLET | Freq: Once | ORAL | Status: AC
  Administered 2015-12-17: 650 mg via ORAL

## 2015-12-17 MED ORDER — DIPHENHYDRAMINE HCL 25 MG PO CAPS
50.0000 mg | ORAL_CAPSULE | Freq: Once | ORAL | Status: AC
  Administered 2015-12-17: 50 mg via ORAL

## 2015-12-17 MED ORDER — ACETAMINOPHEN 325 MG PO TABS
ORAL_TABLET | ORAL | Status: AC
  Filled 2015-12-17: qty 2

## 2015-12-17 MED ORDER — METHYLPREDNISOLONE SODIUM SUCC 125 MG IJ SOLR
125.0000 mg | Freq: Once | INTRAMUSCULAR | Status: AC
  Administered 2015-12-17: 125 mg via INTRAVENOUS

## 2015-12-17 MED ORDER — SODIUM CHLORIDE 0.9 % IV SOLN
Freq: Once | INTRAVENOUS | Status: AC
Start: 2015-12-17 — End: 2015-12-17
  Administered 2015-12-17: 10:00:00 via INTRAVENOUS

## 2015-12-17 MED ORDER — HEPARIN SOD (PORK) LOCK FLUSH 100 UNIT/ML IV SOLN
500.0000 [IU] | Freq: Once | INTRAVENOUS | Status: AC | PRN
  Administered 2015-12-17: 500 [IU]
  Filled 2015-12-17: qty 5

## 2015-12-17 MED ORDER — MONTELUKAST SODIUM 10 MG PO TABS
10.0000 mg | ORAL_TABLET | Freq: Once | ORAL | Status: AC
  Administered 2015-12-17: 10 mg via ORAL
  Filled 2015-12-17: qty 1

## 2015-12-17 MED ORDER — HEPARIN SOD (PORK) LOCK FLUSH 100 UNIT/ML IV SOLN
250.0000 [IU] | Freq: Once | INTRAVENOUS | Status: DC | PRN
  Filled 2015-12-17: qty 5

## 2015-12-17 MED ORDER — METHYLPREDNISOLONE SODIUM SUCC 125 MG IJ SOLR
INTRAMUSCULAR | Status: AC
  Filled 2015-12-17: qty 2

## 2015-12-17 MED ORDER — PROCHLORPERAZINE MALEATE 10 MG PO TABS
ORAL_TABLET | ORAL | Status: AC
  Filled 2015-12-17: qty 1

## 2015-12-17 NOTE — Assessment & Plan Note (Signed)
This is likely due to recent treatment. The patient denies recent history of fevers, cough, chills, diarrhea or dysuria. She is asymptomatic from the leukopenia. I will observe for now.  I will continue the chemotherapy at current dose without dosage adjustment.  If the leukopenia gets progressive worse in the future, I might have to delay her treatment or adjust the chemotherapy dose.   

## 2015-12-17 NOTE — Assessment & Plan Note (Signed)
One of the recent, acute COPD exacerbation nearly caused respiratory failure to the point of death. She had recurrent admission to the hospital last month.  When she was sick, she quit smoking. Now that she is better, she resumed smoking again. Again, I spent some time explaining to her the importance of nicotine cessation. I encouraged her to use her nicotine patches.

## 2015-12-17 NOTE — Progress Notes (Signed)
[facility] OFFICE PROGRESS NOTE  Patient Care Team: Marin Olp, MD as PCP - General (Family Medicine) Jeanann Lewandowsky, MD as Consulting Physician (Medical Oncology) Marin Olp, MD as Consulting Physician (Family Medicine) Heath Lark, MD as Consulting Physician (Hematology and Oncology)  SUMMARY OF ONCOLOGIC HISTORY: Oncology History   Multiple myeloma, kappa light chain disease, Durie-Salmon stage III     Multiple myeloma in relapse ([place])   07/16/2006 Bone Marrow Biopsy BM biopsy is non-diagnostic   09/21/2006 Procedure L5 vertebral biopsy 5% plasma cell   10/25/2006 Bone Marrow Biopsy BM biopsy is hypercellular with 5% plasma cell   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   01/03/2007 - 01/10/2007 Radiation Therapy Approximate date only, received RT for plasmacytoma followed by surgery   09/14/2007 Initial Diagnosis MULTIPLE  MYELOMA   10/05/2007 Bone Marrow Biopsy Bm biopsy was negative   06/18/2008 Bone Marrow Biopsy BM biopsy was negative   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 [facility]   04/13/2011 Relapse/Recurrence Disease relapse   04/14/2011 Bone Marrow Biopsy Bm biopsy showed 2 % plasma cell   04/27/2011 Relapse/Recurrence Disease relapse, treated with Velcade/Cytoxan/Dex   09/07/2011 - 07/28/2013 Chemotherapy She has been receiving Velcade   12/27/2011 Bone Marrow Transplant 2nd transplant at [facility]   07/28/2013 Relapse/Recurrence Chemo is stopped due to progression of disease   08/29/2013 Imaging PEt/CT showed recurrence of disease with new lesion on her rib with compression fracture   09/13/2013 Bone Marrow Biopsy BM biopsy is hypercellular with 6% plasma cell   10/10/2013 - 10/20/2013 Radiation Therapy Started on palliative XRT for rib pain   11/27/2013 - 04/06/2014 Chemotherapy The patient starts chemotherapy with Carfilzomib   07/19/2014 Imaging Repeat bloodwork and PET/CT scan shows significant disease progression.   08/02/2014  - 03/06/2015 Chemotherapy She enrolled in clinical trial using combination therapy with Revlimid, dexamethasone and Elotuzumab   03/07/2015 - 06/04/2015 Chemotherapy She is on maintenance Revlimid only without dexamethasone   05/27/2015 Imaging  MRI spine showed new compression fracture.   06/04/2015 - 06/18/2015 Radiation Therapy  she received palliative radiation therapy to 25 Gy C7-T1   07/10/2015 - 08/07/2015 Chemotherapy She received Elotuzumab, dex and Revlimid. Rx is stopped due to progression   08/01/2015 Imaging MRI of the spine was performed today due to new onset of worsening right hip pain/flank area. MRI show mild progression of the L1 vertebral body.   08/21/2015 - 08/26/2015 Hospital Admission She was admitted to the hospital due to pathologic fracture to right femur   08/22/2015 Surgery She had IM nail to fractured femur   08/23/2015 - 09/20/2015 Radiation Therapy She received XRT to her L1 L2 and Right Femur   09/05/2015 - 09/06/2015 Hospital Admission She had recurrent admission due to displaced fracture, managed conservatively   09/12/2015 - 09/17/2015 Hospital Admission She had recurrent admission for COPD exacerbation   09/30/2015 -  Chemotherapy She was started on Pomalyst   12/10/2015 -  Chemotherapy She started weekly Daratumumab along with Pomalyst    INTERVAL HISTORY: Please see below for problem oriented charting.  she continues to have chronic pain. She continues to smoke. She has not seen her dentist yet and so Zometa was not prescribed. She tolerated chemotherapy well without any side effects. Denies recent infection  Her chronic bone pain is manageable with her current pain medication  REVIEW OF SYSTEMS:   Constitutional: Denies fevers, chills or abnormal weight loss Eyes: Denies blurriness of vision Ears, nose, mouth, throat, and  face: Denies mucositis or sore throat Respiratory: Denies cough, dyspnea or wheezes Cardiovascular: Denies palpitation, chest discomfort or lower  extremity swelling Gastrointestinal:  Denies nausea, heartburn or change in bowel habits Skin: Denies abnormal skin rashes Lymphatics: Denies new lymphadenopathy or easy bruising Neurological:Denies numbness, tingling or new weaknesses Behavioral/Psych: Mood is stable, no new changes  All other systems were reviewed with the patient and are negative.  I have reviewed the past medical history, past surgical history, social history and family history with the patient and they are unchanged from previous note.  ALLERGIES:  is allergic to codeine; ibuprofen; and albuterol.  MEDICATIONS:  Current Outpatient Prescriptions  Medication Sig Dispense Refill  . acyclovir (ZOVIRAX) 400 MG tablet Take 1 tablet (400 mg total) by mouth daily. 30 tablet 6  . ALPRAZolam (XANAX) 0.25 MG tablet Take 1 tablet (0.25 mg total) by mouth at bedtime as needed for anxiety. 60 tablet 0  . aspirin 81 MG tablet Take 81 mg by mouth daily.    . Cholecalciferol (VITAMIN D3) 5000 UNITS CAPS Take 5,000 Units by mouth daily.     . cyclobenzaprine (FLEXERIL) 10 MG tablet Take 1 tablet (10 mg total) by mouth 3 (three) times daily as needed for muscle spasms. 15 tablet 0  . ipratropium (ATROVENT HFA) 17 MCG/ACT inhaler Inhale 2 puffs into the lungs every 4 (four) hours as needed for wheezing. 1 Inhaler 12  . levothyroxine (SYNTHROID, LEVOTHROID) 50 MCG tablet TAKE 1 TABLET (50 MCG TOTAL) BY MOUTH DAILY BEFORE BREAKFAST. 90 tablet 1  . losartan (COZAAR) 50 MG tablet Take 1 tablet (50 mg total) by mouth daily. 90 tablet 1  . magic mouthwash SOLN Take 10 mLs by mouth 4 (four) times daily as needed for mouth pain. Swish and spit  Or  Swish and swallow. 480 mL 2  . morphine (MS CONTIN) 100 MG 12 hr tablet Take 100 mg by mouth 3 (three) times daily.    Marland Kitchen morphine (MSIR) 30 MG tablet Take 1-1.5 tablets (30-45 mg total) by mouth every 2 (two) hours as needed for moderate pain or severe pain. (Patient taking differently: Take 30-45 mg by  mouth every 2 (two) hours as needed for moderate pain or severe pain. Take one tablet every 2 hours as needed for moderate pain) 30 tablet 0  . nicotine (NICODERM CQ - DOSED IN MG/24 HOURS) 14 mg/24hr patch Place 1 patch (14 mg total) onto the skin daily. 28 patch 0  . pomalidomide (POMALYST) 3 MG capsule Take 1 capsule (3 mg total) by mouth daily. Take with water on days 1-21. Repeat every 28 days. 21 capsule 0  . senna-docusate (SENOKOT-S) 8.6-50 MG tablet Take 1 tablet by mouth daily.    Marland Kitchen tiotropium (SPIRIVA HANDIHALER) 18 MCG inhalation capsule Place 1 capsule (18 mcg total) into inhaler and inhale daily. 30 capsule 12  . [DISCONTINUED] amLODipine (NORVASC) 5 MG tablet Take 1 tablet (5 mg total) by mouth daily. 90 tablet 3  . [DISCONTINUED] gabapentin (NEURONTIN) 600 MG tablet Take 600 mg by mouth 3 (three) times daily.       No current facility-administered medications for this visit.   Facility-Administered Medications Ordered in Other Visits  Medication Dose Route Frequency Provider Last Rate Last Dose  . 0.9 %  sodium chloride infusion   Intravenous Once Heath Lark, MD      . acetaminophen (TYLENOL) tablet 650 mg  650 mg Oral Once Heath Lark, MD      . [facility])  1,120 mg in sodium chloride 0.9 % 444 mL (2.24 mg/mL) chemo infusion  16 mg/kg (Treatment Plan Actual) Intravenous Once Heath Lark, MD      . diphenhydrAMINE (BENADRYL) capsule 50 mg  50 mg Oral Once Heath Lark, MD      . heparin lock flush 100 unit/mL  500 Units Intracatheter Once PRN Heath Lark, MD      . heparin lock flush 100 unit/mL  250 Units Intracatheter Once PRN Heath Lark, MD      . methylPREDNISolone sodium succinate (SOLU-MEDROL) 125 mg/2 mL injection 125 mg  125 mg Intravenous Once Heath Lark, MD      . montelukast (SINGULAIR) tablet 10 mg  10 mg Oral Once Heath Lark, MD      . prochlorperazine (COMPAZINE) tablet 10 mg  10 mg Oral Once Heath Lark, MD      . sodium chloride flush (NS) 0.9 % injection 10  mL  10 mL Intracatheter PRN Heath Lark, MD        PHYSICAL EXAMINATION: ECOG PERFORMANCE STATUS: 1 - Symptomatic but completely ambulatory  Filed Vitals:   12/17/15 0855  BP: 161/89  Pulse: 75  Temp: 97.8 F (36.6 C)  Resp: 18   Filed Weights   12/17/15 0855  Weight: 153 lb 3.2 oz (69.491 kg)    GENERAL:alert, no distress and comfortable SKIN: skin color, texture, turgor are normal, no rashes or significant lesions EYES: normal, Conjunctiva are pink and non-injected, sclera clear OROPHARYNX:no exudate, no erythema and lips, buccal mucosa, and tongue normal  NECK: supple, thyroid normal size, non-tender, without nodularity LYMPH:  no palpable lymphadenopathy in the cervical, axillary or inguinal LUNGS: clear to auscultation and percussion with normal breathing effort HEART: regular rate & rhythm and no murmurs and no lower extremity edema ABDOMEN:abdomen soft, non-tender and normal bowel sounds Musculoskeletal:no cyanosis of digits and no clubbing  NEURO: alert & oriented x 3 with fluent speech, no focal motor/sensory deficits  LABORATORY DATA:  I have reviewed the data as listed    Component Value Date/Time   NA 140 12/17/2015 0833   NA 141 09/11/2015 2255   K 3.5 12/17/2015 0833   K 4.3 09/11/2015 2255   CL 107 09/11/2015 2255   CL 107 03/10/2013 1014   CO2 22 12/17/2015 0833   CO2 27 09/11/2015 2255   GLUCOSE 103 12/17/2015 0833   GLUCOSE 106* 09/11/2015 2255   GLUCOSE 111* 03/10/2013 1014   GLUCOSE 98 08/31/2006 1024   BUN 19.3 12/17/2015 0833   BUN 16 09/11/2015 2255   CREATININE 0.8 12/17/2015 0833   CREATININE 0.72 09/12/2015 0620   CALCIUM 9.2 12/17/2015 0833   CALCIUM 9.1 09/11/2015 2255   PROT 5.9* 12/17/2015 0833   PROT 6.0 11/20/2015 1146   PROT 6.6 09/11/2014 0336   ALBUMIN 3.2* 12/17/2015 0833   ALBUMIN 3.4* 09/11/2014 0336   AST 14 12/17/2015 0833   AST 18 09/11/2014 0336   ALT 23 12/17/2015 0833   ALT 18 09/11/2014 0336   ALKPHOS 93  12/17/2015 0833   ALKPHOS 113 09/11/2014 0336   BILITOT <0.30 12/17/2015 0833   BILITOT 0.6 09/11/2014 0336   GFRNONAA >60 09/12/2015 0620   GFRAA >60 09/12/2015 0620    No results found for: SPEP, UPEP  Lab Results  Component Value Date   WBC 3.3* 12/17/2015   NEUTROABS 1.5 12/17/2015   HGB 12.1 12/17/2015   HCT 36.8 12/17/2015   MCV 93.2 12/17/2015   PLT 173 12/17/2015  Chemistry      Component Value Date/Time   NA 140 12/17/2015 0833   NA 141 09/11/2015 2255   K 3.5 12/17/2015 0833   K 4.3 09/11/2015 2255   CL 107 09/11/2015 2255   CL 107 03/10/2013 1014   CO2 22 12/17/2015 0833   CO2 27 09/11/2015 2255   BUN 19.3 12/17/2015 0833   BUN 16 09/11/2015 2255   CREATININE 0.8 12/17/2015 0833   CREATININE 0.72 09/12/2015 0620      Component Value Date/Time   CALCIUM 9.2 12/17/2015 0833   CALCIUM 9.1 09/11/2015 2255   ALKPHOS 93 12/17/2015 0833   ALKPHOS 113 09/11/2014 0336   AST 14 12/17/2015 0833   AST 18 09/11/2014 0336   ALT 23 12/17/2015 0833   ALT 18 09/11/2014 0336   BILITOT <0.30 12/17/2015 0833   BILITOT 0.6 09/11/2014 0336      ASSESSMENT & PLAN:  Multiple myeloma in relapse ([place]) I reviewed the most recent blood work with her. Her serum light chain is stable. She has no recurrence of admission to the hospital. Her pain appears to be under control. She was started Daratumumab last week and tolerated treatment well. I told her to discontinue dexamethasone. We will continue Pomalidomide for 21 days on, 7 days off. I will repeat myeloma studies at the end of the month to assess response to treatment  Tobacco abuse One of the recent, acute COPD exacerbation nearly caused respiratory failure to the point of death. She had recurrent admission to the hospital last month.  When she was sick, she quit smoking. Now that she is better, she resumed smoking again. Again, I spent some time explaining to her the importance of nicotine cessation. I  encouraged her to use her nicotine patches.  Leukopenia due to antineoplastic chemotherapy This is likely due to recent treatment. The patient denies recent history of fevers, cough, chills, diarrhea or dysuria. She is asymptomatic from the leukopenia. I will observe for now.  I will continue the chemotherapy at current dose without dosage adjustment.  If the leukopenia gets progressive worse in the future, I might have to delay her treatment or adjust the chemotherapy dose.     Orders Placed This Encounter  Procedures  . Kappa/lambda light chains    Standing Status: Future     Number of Occurrences:      Standing Expiration Date: 01/20/2017  . Multiple Myeloma Panel (SPEP&IFE w/QIG)    Standing Status: Future     Number of Occurrences:      Standing Expiration Date: 01/20/2017   All questions were answered. The patient knows to call the clinic with any problems, questions or concerns. No barriers to learning was detected. I spent 15 minutes counseling the patient face to face. The total time spent in the appointment was 20 minutes and more than 50% was on counseling and review of test results     [facility], [place], MD 12/17/2015 9:57 AM

## 2015-12-17 NOTE — Telephone Encounter (Signed)
Per staff message and POF I have scheduled appts. Advised scheduler of appts. Advised scheduler on 2/28 unavailable to schedule treatment due to other MD appt.  JMW

## 2015-12-17 NOTE — Telephone Encounter (Signed)
Pt confirmed labs/ov per 02/14 POF, gave pt AVS and Calendar..... KJ, sent msg to add chemo °

## 2015-12-17 NOTE — Assessment & Plan Note (Signed)
I reviewed the most recent blood work with her. Her serum light chain is stable. She has no recurrence of admission to the hospital. Her pain appears to be under control. She was started Daratumumab last week and tolerated treatment well. I told her to discontinue dexamethasone. We will continue Pomalidomide for 21 days on, 7 days off. I will repeat myeloma studies at the end of the month to assess response to treatment

## 2015-12-24 ENCOUNTER — Other Ambulatory Visit: Payer: Self-pay | Admitting: Hematology and Oncology

## 2015-12-24 ENCOUNTER — Telehealth: Payer: Self-pay | Admitting: *Deleted

## 2015-12-24 ENCOUNTER — Other Ambulatory Visit: Payer: Self-pay

## 2015-12-24 ENCOUNTER — Ambulatory Visit (HOSPITAL_BASED_OUTPATIENT_CLINIC_OR_DEPARTMENT_OTHER): Payer: Medicare Other

## 2015-12-24 ENCOUNTER — Encounter: Payer: Self-pay | Admitting: *Deleted

## 2015-12-24 ENCOUNTER — Other Ambulatory Visit (HOSPITAL_BASED_OUTPATIENT_CLINIC_OR_DEPARTMENT_OTHER): Payer: Medicare Other

## 2015-12-24 ENCOUNTER — Ambulatory Visit: Payer: Medicare Other

## 2015-12-24 VITALS — BP 98/71 | HR 87 | Temp 98.1°F | Resp 18

## 2015-12-24 DIAGNOSIS — R51 Headache: Principal | ICD-10-CM

## 2015-12-24 DIAGNOSIS — R519 Headache, unspecified: Secondary | ICD-10-CM

## 2015-12-24 DIAGNOSIS — C9002 Multiple myeloma in relapse: Secondary | ICD-10-CM

## 2015-12-24 DIAGNOSIS — Z95828 Presence of other vascular implants and grafts: Secondary | ICD-10-CM

## 2015-12-24 DIAGNOSIS — Z5112 Encounter for antineoplastic immunotherapy: Secondary | ICD-10-CM

## 2015-12-24 LAB — CBC WITH DIFFERENTIAL/PLATELET
BASO%: 1.1 % (ref 0.0–2.0)
BASOS ABS: 0 10*3/uL (ref 0.0–0.1)
EOS ABS: 0.5 10*3/uL (ref 0.0–0.5)
EOS%: 16.2 % — ABNORMAL HIGH (ref 0.0–7.0)
HCT: 42 % (ref 34.8–46.6)
HGB: 13.6 g/dL (ref 11.6–15.9)
LYMPH%: 19.4 % (ref 14.0–49.7)
MCH: 30.8 pg (ref 25.1–34.0)
MCHC: 32.4 g/dL (ref 31.5–36.0)
MCV: 95.2 fL (ref 79.5–101.0)
MONO#: 0.7 10*3/uL (ref 0.1–0.9)
MONO%: 23.6 % — AB (ref 0.0–14.0)
NEUT%: 39.7 % (ref 38.4–76.8)
NEUTROS ABS: 1.1 10*3/uL — AB (ref 1.5–6.5)
PLATELETS: 188 10*3/uL (ref 145–400)
RBC: 4.41 10*6/uL (ref 3.70–5.45)
RDW: 15.9 % — ABNORMAL HIGH (ref 11.2–14.5)
WBC: 2.8 10*3/uL — ABNORMAL LOW (ref 3.9–10.3)
lymph#: 0.6 10*3/uL — ABNORMAL LOW (ref 0.9–3.3)

## 2015-12-24 LAB — COMPREHENSIVE METABOLIC PANEL
ALT: 35 U/L (ref 0–55)
ANION GAP: 10 meq/L (ref 3–11)
AST: 28 U/L (ref 5–34)
Albumin: 3.1 g/dL — ABNORMAL LOW (ref 3.5–5.0)
Alkaline Phosphatase: 124 U/L (ref 40–150)
BUN: 14.4 mg/dL (ref 7.0–26.0)
CO2: 22 meq/L (ref 22–29)
Calcium: 9 mg/dL (ref 8.4–10.4)
Chloride: 108 mEq/L (ref 98–109)
Creatinine: 0.8 mg/dL (ref 0.6–1.1)
Glucose: 133 mg/dl (ref 70–140)
POTASSIUM: 3.6 meq/L (ref 3.5–5.1)
Sodium: 140 mEq/L (ref 136–145)
TOTAL PROTEIN: 6.1 g/dL — AB (ref 6.4–8.3)

## 2015-12-24 MED ORDER — PROCHLORPERAZINE MALEATE 10 MG PO TABS
ORAL_TABLET | ORAL | Status: AC
  Filled 2015-12-24: qty 1

## 2015-12-24 MED ORDER — METHYLPREDNISOLONE SODIUM SUCC 125 MG IJ SOLR
INTRAMUSCULAR | Status: AC
  Filled 2015-12-24: qty 2

## 2015-12-24 MED ORDER — MONTELUKAST SODIUM 10 MG PO TABS
10.0000 mg | ORAL_TABLET | Freq: Once | ORAL | Status: AC
  Administered 2015-12-24: 10 mg via ORAL
  Filled 2015-12-24: qty 1

## 2015-12-24 MED ORDER — SODIUM CHLORIDE 0.9 % IV SOLN
Freq: Once | INTRAVENOUS | Status: AC
  Administered 2015-12-24: 09:00:00 via INTRAVENOUS

## 2015-12-24 MED ORDER — HEPARIN SOD (PORK) LOCK FLUSH 100 UNIT/ML IV SOLN
500.0000 [IU] | Freq: Once | INTRAVENOUS | Status: AC | PRN
  Administered 2015-12-24: 500 [IU]
  Filled 2015-12-24: qty 5

## 2015-12-24 MED ORDER — SODIUM CHLORIDE 0.9% FLUSH
10.0000 mL | INTRAVENOUS | Status: DC | PRN
  Administered 2015-12-24: 10 mL via INTRAVENOUS
  Filled 2015-12-24: qty 10

## 2015-12-24 MED ORDER — HYDROMORPHONE HCL 4 MG/ML IJ SOLN
2.0000 mg | INTRAMUSCULAR | Status: DC | PRN
  Administered 2015-12-24: 2 mg via INTRAVENOUS

## 2015-12-24 MED ORDER — ACETAMINOPHEN 325 MG PO TABS
650.0000 mg | ORAL_TABLET | Freq: Once | ORAL | Status: AC
  Administered 2015-12-24: 650 mg via ORAL

## 2015-12-24 MED ORDER — DIPHENHYDRAMINE HCL 25 MG PO CAPS
50.0000 mg | ORAL_CAPSULE | Freq: Once | ORAL | Status: AC
  Administered 2015-12-24: 50 mg via ORAL

## 2015-12-24 MED ORDER — DIPHENHYDRAMINE HCL 25 MG PO CAPS
ORAL_CAPSULE | ORAL | Status: AC
Start: 2015-12-24 — End: 2015-12-24
  Filled 2015-12-24: qty 2

## 2015-12-24 MED ORDER — METHYLPREDNISOLONE SODIUM SUCC 125 MG IJ SOLR
125.0000 mg | Freq: Once | INTRAMUSCULAR | Status: AC
  Administered 2015-12-24: 125 mg via INTRAVENOUS

## 2015-12-24 MED ORDER — SODIUM CHLORIDE 0.9% FLUSH
10.0000 mL | INTRAVENOUS | Status: DC | PRN
  Administered 2015-12-24: 10 mL
  Filled 2015-12-24: qty 10

## 2015-12-24 MED ORDER — HYDROMORPHONE HCL 4 MG/ML IJ SOLN
INTRAMUSCULAR | Status: AC
  Filled 2015-12-24: qty 1

## 2015-12-24 MED ORDER — DARATUMUMAB CHEMO INJECTION 400 MG/20ML
17.0000 mg/kg | Freq: Once | INTRAVENOUS | Status: AC
  Administered 2015-12-24: 1200 mg via INTRAVENOUS
  Filled 2015-12-24: qty 60

## 2015-12-24 MED ORDER — ACETAMINOPHEN 325 MG PO TABS
ORAL_TABLET | ORAL | Status: AC
  Filled 2015-12-24: qty 2

## 2015-12-24 MED ORDER — PROCHLORPERAZINE MALEATE 10 MG PO TABS
10.0000 mg | ORAL_TABLET | Freq: Once | ORAL | Status: AC
  Administered 2015-12-24: 10 mg via ORAL

## 2015-12-24 NOTE — Patient Instructions (Addendum)
[place] Discharge Instructions for Patients Receiving Chemotherapy  Today you received the following chemotherapy agents; Daratumumab.   To help prevent nausea and vomiting after your treatment, we encourage you to take your nausea medication as directed.    If you develop nausea and vomiting that is not controlled by your nausea medication, call the clinic.   BELOW ARE SYMPTOMS THAT SHOULD BE REPORTED IMMEDIATELY:  *FEVER GREATER THAN 100.5 F  *CHILLS WITH OR WITHOUT FEVER  NAUSEA AND VOMITING THAT IS NOT CONTROLLED WITH YOUR NAUSEA MEDICATION  *UNUSUAL SHORTNESS OF BREATH  *UNUSUAL BRUISING OR BLEEDING  TENDERNESS IN MOUTH AND THROAT WITH OR WITHOUT PRESENCE OF ULCERS  *URINARY PROBLEMS  *BOWEL PROBLEMS  UNUSUAL RASH Items with * indicate a potential emergency and should be followed up as soon as possible.  Feel free to call the clinic you have any questions or concerns. The clinic phone number is [phone].  Please show the [place] at check-in to the Emergency Department and triage nurse.  RESUME DEXAMETHASONE 4 MG ONCE DAILY.  CALL OFFICE IF YOU NEED A REFILL.   HOLD LOSARTAN (COZAAR) UNTIL NEXT APPOINTMENT DUE TO LOW BLOOD PRESSURE.   DR. Alvy Bimler SENT REFERRAL TO SEE A NEUROLOGIST.  THEIR OFFICE SHOULD BE CALLING YOU TO SET UP APPOINTMENT.

## 2015-12-24 NOTE — Telephone Encounter (Signed)
Per staff message and POF I have scheduled appts. Advised scheduler of appts. JMW  

## 2015-12-24 NOTE — Progress Notes (Signed)
1200-Pt BP 82/59, HR 88. Pt has no complaints and is otherwise stable. BP value reported to Dr. Alvy Bimler, and her recommendation is to continue with treatment, and instruct pt to hold her cozaar at home until next MD visit. Pt voices understanding of these instructions.

## 2015-12-24 NOTE — Patient Instructions (Signed)

## 2015-12-24 NOTE — Progress Notes (Signed)
Upon arrival to clinic,  Pt c/o severe H/As for past several days.  States "it feels like my head is going to pop off my body" and she says she almost went to ED a few times the past few night d/t pain being so bad.  She reports pain as aching and also numb on right side of head.  It has been waking her up in middle of the night the past 2 to 3 nights "worse than before."  She is worried she might have an "Aneurysm or something."  Informed pt last MRI did not show any reason for the Headaches.  Notified Dr. Alvy Bimler and she instructs pt to resume Dexamethasone 4 mg daily and she will make a Neurology referral.  She also ordered Hydromorphone for now.  Pt states has enough Dex at home and does not need refill at this time.  She verbalized understanding to take Dex once daily.  Also notified Dr. Alvy Bimler of low BP. She instructed ok to proceed w/ treatment as ordered.

## 2015-12-25 ENCOUNTER — Other Ambulatory Visit: Payer: Self-pay | Admitting: *Deleted

## 2015-12-25 MED ORDER — POMALIDOMIDE 3 MG PO CAPS: 3.0000 mg | ORAL_CAPSULE | Freq: Every day | ORAL | Status: DC

## 2015-12-27 ENCOUNTER — Telehealth: Payer: Self-pay | Admitting: Hematology and Oncology

## 2015-12-27 ENCOUNTER — Encounter: Payer: Self-pay | Admitting: Hematology and Oncology

## 2015-12-27 NOTE — Progress Notes (Signed)
Per [facility] pomalyst was shipped via [employer] 12/26/15

## 2015-12-27 NOTE — Telephone Encounter (Signed)
per pof to sch pt referral-in [facility] they will call pt to make appt

## 2015-12-31 ENCOUNTER — Other Ambulatory Visit (HOSPITAL_BASED_OUTPATIENT_CLINIC_OR_DEPARTMENT_OTHER): Payer: Medicare Other

## 2015-12-31 ENCOUNTER — Ambulatory Visit (HOSPITAL_BASED_OUTPATIENT_CLINIC_OR_DEPARTMENT_OTHER): Payer: Medicare Other

## 2015-12-31 ENCOUNTER — Ambulatory Visit: Payer: Self-pay | Admitting: Internal Medicine

## 2015-12-31 ENCOUNTER — Ambulatory Visit: Payer: Medicare Other

## 2015-12-31 ENCOUNTER — Encounter (HOSPITAL_COMMUNITY): Payer: Self-pay

## 2015-12-31 ENCOUNTER — Emergency Department (HOSPITAL_COMMUNITY)
Admission: EM | Admit: 2015-12-31 | Discharge: 2015-12-31 | Disposition: A | Discharge status: Home / Self Care | Payer: Medicare Other | Attending: Emergency Medicine | Admitting: Emergency Medicine

## 2015-12-31 VITALS — BP 136/81 | HR 72 | Temp 98.6°F | Resp 16

## 2015-12-31 DIAGNOSIS — Z7952 Long term (current) use of systemic steroids: Secondary | ICD-10-CM | POA: Diagnosis not present

## 2015-12-31 DIAGNOSIS — F419 Anxiety disorder, unspecified: Secondary | ICD-10-CM | POA: Insufficient documentation

## 2015-12-31 DIAGNOSIS — G8929 Other chronic pain: Secondary | ICD-10-CM | POA: Diagnosis not present

## 2015-12-31 DIAGNOSIS — Z7982 Long term (current) use of aspirin: Secondary | ICD-10-CM | POA: Diagnosis not present

## 2015-12-31 DIAGNOSIS — E559 Vitamin D deficiency, unspecified: Secondary | ICD-10-CM | POA: Insufficient documentation

## 2015-12-31 DIAGNOSIS — Z79899 Other long term (current) drug therapy: Secondary | ICD-10-CM | POA: Diagnosis not present

## 2015-12-31 DIAGNOSIS — Z8579 Personal history of other malignant neoplasms of lymphoid, hematopoietic and related tissues: Secondary | ICD-10-CM | POA: Insufficient documentation

## 2015-12-31 DIAGNOSIS — Z8619 Personal history of other infectious and parasitic diseases: Secondary | ICD-10-CM | POA: Insufficient documentation

## 2015-12-31 DIAGNOSIS — C9002 Multiple myeloma in relapse: Secondary | ICD-10-CM

## 2015-12-31 DIAGNOSIS — M542 Cervicalgia: Secondary | ICD-10-CM | POA: Diagnosis not present

## 2015-12-31 DIAGNOSIS — Z86010 Personal history of colonic polyps: Secondary | ICD-10-CM | POA: Diagnosis not present

## 2015-12-31 DIAGNOSIS — F17210 Nicotine dependence, cigarettes, uncomplicated: Secondary | ICD-10-CM | POA: Insufficient documentation

## 2015-12-31 DIAGNOSIS — F329 Major depressive disorder, single episode, unspecified: Secondary | ICD-10-CM | POA: Insufficient documentation

## 2015-12-31 DIAGNOSIS — Z8719 Personal history of other diseases of the digestive system: Secondary | ICD-10-CM | POA: Diagnosis not present

## 2015-12-31 DIAGNOSIS — Z5112 Encounter for antineoplastic immunotherapy: Secondary | ICD-10-CM | POA: Diagnosis present

## 2015-12-31 DIAGNOSIS — I10 Essential (primary) hypertension: Secondary | ICD-10-CM | POA: Insufficient documentation

## 2015-12-31 LAB — CBC WITH DIFFERENTIAL/PLATELET
BASO%: 0.4 % (ref 0.0–2.0)
BASOS ABS: 0 10*3/uL (ref 0.0–0.1)
EOS ABS: 0 10*3/uL (ref 0.0–0.5)
EOS%: 0.4 % (ref 0.0–7.0)
HEMATOCRIT: 38.6 % (ref 34.8–46.6)
HEMOGLOBIN: 12.8 g/dL (ref 11.6–15.9)
LYMPH#: 1.8 10*3/uL (ref 0.9–3.3)
LYMPH%: 35.3 % (ref 14.0–49.7)
MCH: 30.5 pg (ref 25.1–34.0)
MCHC: 33.2 g/dL (ref 31.5–36.0)
MCV: 91.9 fL (ref 79.5–101.0)
MONO#: 0.7 10*3/uL (ref 0.1–0.9)
MONO%: 12.9 % (ref 0.0–14.0)
NEUT%: 51 % (ref 38.4–76.8)
NEUTROS ABS: 2.6 10*3/uL (ref 1.5–6.5)
PLATELETS: 232 10*3/uL (ref 145–400)
RBC: 4.2 10*6/uL (ref 3.70–5.45)
RDW: 15.7 % — ABNORMAL HIGH (ref 11.2–14.5)
WBC: 5 10*3/uL (ref 3.9–10.3)

## 2015-12-31 LAB — COMPREHENSIVE METABOLIC PANEL
ALBUMIN: 3.4 g/dL — AB (ref 3.5–5.0)
ALK PHOS: 132 U/L (ref 40–150)
ALT: 15 U/L (ref 0–55)
AST: 13 U/L (ref 5–34)
Anion Gap: 11 mEq/L (ref 3–11)
BUN: 12.3 mg/dL (ref 7.0–26.0)
CALCIUM: 9.6 mg/dL (ref 8.4–10.4)
CO2: 22 mEq/L (ref 22–29)
CREATININE: 0.8 mg/dL (ref 0.6–1.1)
Chloride: 107 mEq/L (ref 98–109)
EGFR: 88 mL/min/{1.73_m2} — ABNORMAL LOW (ref >90)
GLUCOSE: 122 mg/dL (ref 70–140)
POTASSIUM: 3.8 meq/L (ref 3.5–5.1)
Sodium: 139 mEq/L (ref 136–145)
TOTAL PROTEIN: 6.3 g/dL — AB (ref 6.4–8.3)
Total Bilirubin: 0.44 mg/dL (ref 0.20–1.20)

## 2015-12-31 MED ORDER — DIAZEPAM 5 MG/ML IJ SOLN: 10.0000 mg | Freq: Once | INTRAMUSCULAR | Status: DC

## 2015-12-31 MED ORDER — SODIUM CHLORIDE 0.9% FLUSH
10.0000 mL | INTRAVENOUS | Status: DC | PRN
  Administered 2015-12-31: 10 mL via INTRAVENOUS
  Filled 2015-12-31: qty 10

## 2015-12-31 MED ORDER — SODIUM CHLORIDE 0.9 % IV SOLN
17.0000 mg/kg | Freq: Once | INTRAVENOUS | Status: AC
  Administered 2015-12-31: 1200 mg via INTRAVENOUS
  Filled 2015-12-31: qty 60

## 2015-12-31 MED ORDER — ACETAMINOPHEN 325 MG PO TABS
650.0000 mg | ORAL_TABLET | Freq: Once | ORAL | Status: AC
  Administered 2015-12-31: 650 mg via ORAL

## 2015-12-31 MED ORDER — SODIUM CHLORIDE 0.9% FLUSH
10.0000 mL | INTRAVENOUS | Status: DC | PRN
  Administered 2015-12-31: 10 mL
  Filled 2015-12-31: qty 10

## 2015-12-31 MED ORDER — SODIUM CHLORIDE 0.9 % IV SOLN
Freq: Once | INTRAVENOUS | Status: AC
  Administered 2015-12-31: 10:00:00 via INTRAVENOUS

## 2015-12-31 MED ORDER — PROCHLORPERAZINE MALEATE 10 MG PO TABS
ORAL_TABLET | ORAL | Status: AC
  Filled 2015-12-31: qty 1

## 2015-12-31 MED ORDER — ACETAMINOPHEN 325 MG PO TABS
ORAL_TABLET | ORAL | Status: AC
  Filled 2015-12-31: qty 2

## 2015-12-31 MED ORDER — KETOROLAC TROMETHAMINE 30 MG/ML IJ SOLN
60.0000 mg | Freq: Once | INTRAMUSCULAR | Status: AC
  Administered 2015-12-31: 60 mg via INTRAMUSCULAR
  Filled 2015-12-31: qty 2

## 2015-12-31 MED ORDER — METHYLPREDNISOLONE SODIUM SUCC 125 MG IJ SOLR
125.0000 mg | Freq: Once | INTRAMUSCULAR | Status: AC
  Administered 2015-12-31: 125 mg via INTRAVENOUS

## 2015-12-31 MED ORDER — DIAZEPAM 5 MG/ML IJ SOLN
10.0000 mg | Freq: Once | INTRAMUSCULAR | Status: AC
  Administered 2015-12-31: 10 mg via INTRAMUSCULAR
  Filled 2015-12-31: qty 2

## 2015-12-31 MED ORDER — HEPARIN SOD (PORK) LOCK FLUSH 100 UNIT/ML IV SOLN
500.0000 [IU] | Freq: Once | INTRAVENOUS | Status: AC | PRN
Start: 2015-12-31 — End: 2015-12-31
  Administered 2015-12-31: 500 [IU]
  Filled 2015-12-31: qty 5

## 2015-12-31 MED ORDER — PROCHLORPERAZINE MALEATE 10 MG PO TABS
10.0000 mg | ORAL_TABLET | Freq: Once | ORAL | Status: AC
  Administered 2015-12-31: 10 mg via ORAL

## 2015-12-31 MED ORDER — DIPHENHYDRAMINE HCL 25 MG PO CAPS
50.0000 mg | ORAL_CAPSULE | Freq: Once | ORAL | Status: AC
  Administered 2015-12-31: 50 mg via ORAL

## 2015-12-31 MED ORDER — DIAZEPAM 10 MG PO TABS: 10.0000 mg | ORAL_TABLET | Freq: Four times a day (QID) | ORAL | Status: DC | PRN

## 2015-12-31 MED ORDER — METHYLPREDNISOLONE SODIUM SUCC 125 MG IJ SOLR
INTRAMUSCULAR | Status: AC
  Filled 2015-12-31: qty 2

## 2015-12-31 NOTE — Patient Instructions (Signed)

## 2015-12-31 NOTE — Progress Notes (Signed)
Patient experienced severe pain this am when she woke up around 5:30am.  She took "90mg  of her extended release and 2 of her 30mg  morphine."   Her family called 76.  By the time they got there her pain was just beginning to get better.  She choose to let her family take her to the ED instead of EMS.  At the ED they gave her valium 10mg  IM and toradol 60mg  IM.Marland Kitchen  They also gave gave her a prescription for valium.  Her is "much better right now."  Her pain comes and goes.  At its worst it is a 5 or 6 and then in between it is a 0. Let Dr. Alvy Bimler know that patient was in ED this am with neck pain and that she is doing much better now.

## 2015-12-31 NOTE — Discharge Instructions (Signed)
° °  Use heat on the sore area 3-4 times a day. Follow up with the Neurologist for assistance with the neck pain and headaches.  Musculoskeletal Pain Musculoskeletal pain is muscle and boney aches and pains. These pains can occur in any part of the body. Your caregiver may treat you without knowing the cause of the pain. They may treat you if blood or urine tests, X-rays, and other tests were normal.  CAUSES There is often not a definite cause or reason for these pains. These pains may be caused by a type of germ (virus). The discomfort may also come from overuse. Overuse includes working out too hard when your body is not fit. Boney aches also come from weather changes. Bone is sensitive to atmospheric pressure changes. HOME CARE INSTRUCTIONS   Ask when your test results will be ready. Make sure you get your test results.  Only take over-the-counter or prescription medicines for pain, discomfort, or fever as directed by your caregiver. If you were given medications for your condition, do not drive, operate machinery or power tools, or sign legal documents for 24 hours. Do not drink alcohol. Do not take sleeping pills or other medications that may interfere with treatment.  Continue all activities unless the activities cause more pain. When the pain lessens, slowly resume normal activities. Gradually increase the intensity and duration of the activities or exercise.  During periods of severe pain, bed rest may be helpful. Lay or sit in any position that is comfortable.  Putting ice on the injured area.  Put ice in a bag.  Place a towel between your skin and the bag.  Leave the ice on for 15 to 20 minutes, 3 to 4 times a day.  Follow up with your caregiver for continued problems and no reason can be found for the pain. If the pain becomes worse or does not go away, it may be necessary to repeat tests or do additional testing. Your caregiver may need to look further for a possible cause. SEEK  IMMEDIATE MEDICAL CARE IF:  You have pain that is getting worse and is not relieved by medications.  You develop chest pain that is associated with shortness or breath, sweating, feeling sick to your stomach (nauseous), or throw up (vomit).  Your pain becomes localized to the abdomen.  You develop any new symptoms that seem different or that concern you. MAKE SURE YOU:   Understand these instructions.  Will watch your condition.  Will get help right away if you are not doing well or get worse.   This information is not intended to replace advice given to you by your health care provider. Make sure you discuss any questions you have with your health care provider.   Document Released: 10/19/2005 Document Revised: 01/11/2012 Document Reviewed: 06/23/2013 Elsevier Interactive Patient Education [employer].

## 2015-12-31 NOTE — ED Provider Notes (Signed)
CSN: [id number]     Arrival date & time 12/31/15  1941 History   First MD Initiated Contact with Patient 12/31/15 [phone]     Chief Complaint  Patient presents with  . Neck Pain     (Consider location/radiation/quality/duration/timing/severity/associated sxs/prior Treatment) HPI   Kendra Johns is a 65 y.o. female who awoke this morning with right-sided neck pain, which improved when she took a pain pill. The pain tended to return so she decided to come here for evaluation. She has already taken her morning long-acting and short-acting narcotic pain medication. She is recently been evaluated for headaches and referred to neurology, but the appointment has not yet been scheduled. She denies arm, back or leg pain at this time. No recent trauma to the neck. She denies recent fever, chills, cough, shortness of breath, weakness or dizziness. There are no other no modifying factors.   Past Medical History  Diagnosis Date  . Depression   . Hypertension   . Hepatitis C     genotype 1  . Diverticulosis of colon   . Multiple myeloma     kappa light chain, s/p high-dose chemo/stem cell tx - [facility]  . Hx of adenomatous colonic polyps 2007  . Anxiety   . Osteoarthritis   . Sleep apnea   . Hyperlipidemia   . GERD with stricture   . External hemorrhoids   . Hepatitis B virus infection   . IBS (irritable bowel syndrome)   . Helicobacter pylori gastritis 2001    ? if treated  . Sciatic nerve pain     with Dr. Earle Gell  . Chronic low back pain   . Unspecified vitamin D deficiency 09/22/2013  . Allergy   . Fever 11/28/2013  . Dehydration 11/28/2013  . Nausea alone 01/22/2014  . Cough 08/02/2014  . DIVERTICULOSIS, COLON 08/31/2006  . DEPRESSION 08/31/2006    Qualifier: Diagnosis of  By: [name], [name]    . Muscle cramp 11/21/2014  . Chronic fatigue 03/07/2015  . Vitamin D deficiency 05/28/2015  . S/P radiation therapy 06/05/15-06/18/15    C7-T1   . Dysuria 10/08/2015  . Acute intractable  headache 11/14/2015   Past Surgical History  Procedure Laterality Date  . Abdominal hysterectomy    . Oophorectomy    . Dilation and curettage of uterus    . Bone marrow transplant      2009, 2013 [facility]  . Appendectomy    . Colonoscopy w/ polypectomy  08/2006    1-2 adenomas, 6 polyps total, diverticulosis and external hemorrhoids  . Upper gastrointestinal endoscopy  08/2003    esophageal stricture dilation, hiatal hernia, gastrritis  . Cholecystectomy    . Tonsillectomy and adenoidectomy    . Femur im nail Right 08/22/2015    Procedure: INTRAMEDULLARY (IM) NAIL FEMORAL;  Surgeon: [name], MD;  Location: [facility];  Service: Orthopedics;  Laterality: Right;   Family History  Problem Relation Age of Onset  . Alcohol abuse    . Lung cancer Father     smoked  . Leukemia Mother   . Cirrhosis Sister   . Colon cancer Neg Hx   . Lung cancer Sister     smoked   Social History  Substance Use Topics  . Smoking status: Current Every Day Smoker -- 0.25 packs/day for 44 years    Types: Cigarettes  . Smokeless tobacco: Never Used  . Alcohol Use: No   OB History    No data available  Review of Systems  All other systems reviewed and are negative.     Allergies  Codeine; Ibuprofen; and Albuterol  Home Medications   Prior to Admission medications   Medication Sig Start Date End Date Taking? Authorizing Provider  acyclovir (ZOVIRAX) 400 MG tablet Take 1 tablet (400 mg total) by mouth daily. 11/27/15   Heath Lark, MD  ALPRAZolam (XANAX) 0.25 MG tablet Take 1 tablet (0.25 mg total) by mouth at bedtime as needed for anxiety. 11/27/15   Heath Lark, MD  aspirin 81 MG tablet Take 81 mg by mouth daily.    Historical Provider, MD  Cholecalciferol (VITAMIN D3) 5000 UNITS CAPS Take 5,000 Units by mouth daily.     Historical Provider, MD  cyclobenzaprine (FLEXERIL) 10 MG tablet Take 1 tablet (10 mg total) by mouth 3 (three) times daily as needed for muscle spasms. 08/26/15   Geradine Girt, DO  dexamethasone (DECADRON) 4 MG tablet Take 4 mg by mouth daily. 12/24/15   Historical Provider, MD  diazepam (VALIUM) 10 MG tablet Take 1 tablet (10 mg total) by mouth every 6 (six) hours as needed (muscle spasm). 12/31/15   Daleen Bo, MD  ipratropium (ATROVENT HFA) 17 MCG/ACT inhaler Inhale 2 puffs into the lungs every 4 (four) hours as needed for wheezing. 09/13/14   Heath Lark, MD  levothyroxine (SYNTHROID, LEVOTHROID) 50 MCG tablet TAKE 1 TABLET (50 MCG TOTAL) BY MOUTH DAILY BEFORE BREAKFAST. 10/30/15   Heath Lark, MD  losartan (COZAAR) 50 MG tablet Take 1 tablet (50 mg total) by mouth daily. 10/29/15   Marin Olp, MD  magic mouthwash SOLN Take 10 mLs by mouth 4 (four) times daily as needed for mouth pain. Swish and spit  Or  Swish and swallow. 07/10/15   Heath Lark, MD  morphine (MS CONTIN) 100 MG 12 hr tablet Take 100 mg by mouth 3 (three) times daily.    Historical Provider, MD  morphine (MSIR) 30 MG tablet Take 1-1.5 tablets (30-45 mg total) by mouth every 2 (two) hours as needed for moderate pain or severe pain. Patient taking differently: Take 30-45 mg by mouth every 2 (two) hours as needed for moderate pain or severe pain. Take one tablet every 2 hours as needed for moderate pain 08/26/15   Geradine Girt, DO  nicotine (NICODERM CQ - DOSED IN MG/24 HOURS) 14 mg/24hr patch Place 1 patch (14 mg total) onto the skin daily. 09/17/15   [name], MD  pomalidomide (POMALYST) 3 MG capsule Take 1 capsule (3 mg total) by mouth daily. Take with water on days 1-21. Repeat every 28 days. 12/25/15   Heath Lark, MD  senna-docusate (SENOKOT-S) 8.6-50 MG tablet Take 1 tablet by mouth daily. 08/26/15   Geradine Girt, DO  tiotropium (SPIRIVA HANDIHALER) 18 MCG inhalation capsule Place 1 capsule (18 mcg total) into inhaler and inhale daily. 12/03/15   Marin Olp, MD   BP 175/106 mmHg  Pulse 69  Temp(Src) 98 F (36.7 C) (Oral)  Resp 18  SpO2 93% Physical Exam  Constitutional:  She is oriented to person, place, and time. She appears well-developed and well-nourished. She appears distressed (uncomfortable.).  HENT:  Head: Normocephalic and atraumatic.  Right Ear: External ear normal.  Left Ear: External ear normal.  Eyes: Conjunctivae and EOM are normal. Pupils are equal, round, and reactive to light.  Neck: Normal range of motion and phonation normal. Neck supple.  Cardiovascular: Normal rate.   Pulmonary/Chest: Effort normal. She exhibits no  bony tenderness.  Musculoskeletal:  Decreased AROM neck secondary to right neck pain and tenderness. FROM arms and legs  Neurological: She is alert and oriented to person, place, and time. No cranial nerve deficit or sensory deficit. She exhibits normal muscle tone. Coordination normal.  Skin: Skin is warm, dry and intact.  Psychiatric: She has a normal mood and affect. Her behavior is normal. Judgment and thought content normal.  Nursing note and vitals reviewed.   ED Course  Procedures (including critical care time)  Medications  ketorolac (TORADOL) 30 MG/ML injection 60 mg (60 mg Intramuscular Given 12/31/15 0737)  diazepam (VALIUM) injection 10 mg (10 mg Intramuscular Given 12/31/15 0735)    Patient Vitals for the past 24 hrs:  BP Temp Temp src Pulse Resp SpO2  12/31/15 0805 (!) 175/106 mmHg - - 69 18 93 %  12/31/15 0704 (!) 154/105 mmHg 98 F (36.7 C) Oral 81 20 96 %    8:15 AM Reevaluation with update and discussion. After initial assessment and treatment, an updated evaluation reveals she feels better at this time and wants to leave to go to her morning chemotherapy appointment.. [place] Review Labs Reviewed - No data to display  Imaging Review No results found. I have personally reviewed and evaluated these images and lab results as part of my medical decision-making.   EKG Interpretation None      MDM   Final diagnoses:  Neck pain on right side   Reviewed recent imaging of head  CT, and MRI spine images from September 2016. Patient has C4 compression fracture, and prior T1 tumor with compression of the vertebral body. MRI imaging indicates mild disc herniation, of several levels cervical spine. Doubt acute discitis, no fracture, spinal myelopathy.   Nursing Notes Reviewed/ Care Coordinated Applicable Imaging Reviewed Interpretation of Laboratory Data incorporated into ED treatment  The patient appears reasonably screened and/or stabilized for discharge and I doubt any other medical condition or other [facility] requiring further screening, evaluation, or treatment in the ED at this time prior to discharge.  Plan: Home Medications- Valium; Home Treatments- rest, heat; return here if the recommended treatment, does not improve the symptoms; Recommended follow up- PCP and Neurology prn     Daleen Bo, MD 12/31/15 [phone]

## 2015-12-31 NOTE — ED Notes (Signed)
Pt complains of right sided neck pain when she woke up this am, she called ems but had taken a pain pill and started to feel better so didn't come with them, the pain started to come back

## 2015-12-31 NOTE — Patient Instructions (Signed)
[facility] Discharge Instructions for Patients Receiving Chemotherapy  Today you received the following chemotherapy agents; Daratumumab.   To help prevent nausea and vomiting after your treatment, we encourage you to take your nausea medication as directed.    If you develop nausea and vomiting that is not controlled by your nausea medication, call the clinic.   BELOW ARE SYMPTOMS THAT SHOULD BE REPORTED IMMEDIATELY:  *FEVER GREATER THAN 100.5 F  *CHILLS WITH OR WITHOUT FEVER  NAUSEA AND VOMITING THAT IS NOT CONTROLLED WITH YOUR NAUSEA MEDICATION  *UNUSUAL SHORTNESS OF BREATH  *UNUSUAL BRUISING OR BLEEDING  TENDERNESS IN MOUTH AND THROAT WITH OR WITHOUT PRESENCE OF ULCERS  *URINARY PROBLEMS  *BOWEL PROBLEMS  UNUSUAL RASH Items with * indicate a potential emergency and should be followed up as soon as possible.  Feel free to call the clinic you have any questions or concerns. The clinic phone number is [phone].  Please show the [facility] at check-in to the Emergency Department and triage nurse.  RESUME DEXAMETHASONE 4 MG ONCE DAILY.  CALL OFFICE IF YOU NEED A REFILL.   HOLD LOSARTAN (COZAAR) UNTIL NEXT APPOINTMENT DUE TO LOW BLOOD PRESSURE.   DR. Alvy Bimler SENT REFERRAL TO SEE A NEUROLOGIST.  THEIR OFFICE SHOULD BE CALLING YOU TO SET UP APPOINTMENT.

## 2016-01-01 DIAGNOSIS — G894 Chronic pain syndrome: Secondary | ICD-10-CM | POA: Diagnosis not present

## 2016-01-01 DIAGNOSIS — M47812 Spondylosis without myelopathy or radiculopathy, cervical region: Secondary | ICD-10-CM | POA: Diagnosis not present

## 2016-01-01 DIAGNOSIS — Z79891 Long term (current) use of opiate analgesic: Secondary | ICD-10-CM | POA: Diagnosis not present

## 2016-01-01 DIAGNOSIS — M5481 Occipital neuralgia: Secondary | ICD-10-CM | POA: Diagnosis not present

## 2016-01-01 LAB — KAPPA/LAMBDA LIGHT CHAINS
IG KAPPA FREE LIGHT CHAIN: 44.57 mg/L — AB (ref 3.30–19.40)
Ig Lambda Free Light Chain: 3.54 mg/L — ABNORMAL LOW (ref 5.71–26.30)
Kappa/Lambda FluidC Ratio: 12.59 — ABNORMAL HIGH (ref 0.26–1.65)

## 2016-01-02 ENCOUNTER — Ambulatory Visit (INDEPENDENT_AMBULATORY_CARE_PROVIDER_SITE_OTHER): Payer: Medicare Other | Admitting: Neurology

## 2016-01-02 ENCOUNTER — Telehealth: Payer: Self-pay | Admitting: *Deleted

## 2016-01-02 ENCOUNTER — Encounter: Payer: Self-pay | Admitting: Neurology

## 2016-01-02 VITALS — BP 180/102 | HR 94 | Ht 64.0 in | Wt 156.0 lb

## 2016-01-02 DIAGNOSIS — C9002 Multiple myeloma in relapse: Secondary | ICD-10-CM

## 2016-01-02 DIAGNOSIS — S129XXA Fracture of neck, unspecified, initial encounter: Secondary | ICD-10-CM

## 2016-01-02 DIAGNOSIS — M5481 Occipital neuralgia: Secondary | ICD-10-CM

## 2016-01-02 DIAGNOSIS — M542 Cervicalgia: Secondary | ICD-10-CM | POA: Diagnosis not present

## 2016-01-02 DIAGNOSIS — C9000 Multiple myeloma not having achieved remission: Secondary | ICD-10-CM | POA: Diagnosis not present

## 2016-01-02 DIAGNOSIS — M4852XA Collapsed vertebra, not elsewhere classified, cervical region, initial encounter for fracture: Secondary | ICD-10-CM

## 2016-01-02 LAB — MULTIPLE MYELOMA PANEL, SERUM
ALBUMIN SERPL ELPH-MCNC: 3.4 g/dL (ref 2.9–4.4)
ALPHA 1: 0.3 g/dL (ref 0.0–0.4)
ALPHA2 GLOB SERPL ELPH-MCNC: 0.8 g/dL (ref 0.4–1.0)
Albumin/Glob SerPl: 1.5 (ref 0.7–1.7)
B-Globulin SerPl Elph-Mcnc: 0.9 g/dL (ref 0.7–1.3)
GAMMA GLOB SERPL ELPH-MCNC: 0.3 g/dL — AB (ref 0.4–1.8)
GLOBULIN, TOTAL: 2.3 g/dL (ref 2.2–3.9)
IGA/IMMUNOGLOBULIN A, SERUM: 26 mg/dL — AB (ref 87–352)
IGG (IMMUNOGLOBIN G), SERUM: 408 mg/dL — AB (ref 700–1600)
IgM, Qn, Serum: 17 mg/dL — ABNORMAL LOW (ref 26–217)
M PROTEIN SERPL ELPH-MCNC: 0.1 g/dL — AB
Total Protein: 5.7 g/dL — ABNORMAL LOW (ref 6.0–8.5)

## 2016-01-02 MED ORDER — GABAPENTIN 300 MG PO CAPS: 300.0000 mg | ORAL_CAPSULE | Freq: Three times a day (TID) | ORAL | Status: DC

## 2016-01-02 NOTE — Patient Instructions (Signed)
Remember to drink plenty of fluid, eat healthy meals and do not skip any meals. Try to eat protein with a every meal and eat a healthy snack such as fruit or nuts in between meals. Try to keep a regular sleep-wake schedule and try to exercise daily, particularly in the form of walking, 20-30 minutes a day, if you can.   As far as your medications are concerned, I would like to suggest; Gabapentin 300mg  three times d ay  As far as diagnostic testing: MRI of the brain and cervical spine  I would like to see you back after imaging, sooner if we need to. Please call us with any interim questions, concerns, problems, updates or refill requests.   Our phone number is [phone]. We also have an after hours call service for urgent matters and there is a physician on-call for urgent questions. For any emergencies you know to call 911 or go to the nearest emergency room

## 2016-01-02 NOTE — Telephone Encounter (Signed)
[facility] rx gabapentin that was sent. Pt wanted rx to go to [facility] instead. E[facility]prescribed to [facility] requested.

## 2016-01-02 NOTE — Progress Notes (Signed)
[facility]    Provider:  Dr Jaynee Eagles Referring Provider: Marin Olp, MD Primary Care Physician:  [name], MD  CC:  Headaches in the right back of the head  HPI:  Kendra Johns is a 65 y.o. female here as a referral from Dr. Yong Channel for headaches. She has a PMHx of multiple myeloma kappa light chain disease, COPD, HTN. She had Palliative radiation therapy to C7-T1 completed. August.She had a headache that started 3 months ago with tingling, pain and sharpness on the right side of the head(points to the occipital area). Nothing happened before the tingling started, no inciting events or trauma. This past Tuesday morning about 6-7 minutes after waking she has a severe pain on the right side of the neck near the spine. Deep. No inciting event, she was just sitting there. She has gotten better since taking the valium. She can still feel the pain it when she presses down on her spine. It is In the muscle and possibly the bone. (she point to the right c3/c4 area).  The tingling and the pain is continuous (in the occipital right area and She points to the emergence of the lesser occipital nerve at the base of the skull). The pain is tingling,shooting, electric pain.Tender to the touch on the right side. Continuous and severe. No bowel or bladder changes, no arm weakness or paresthesias, no focal weakness, no vision changes, no aphasia or dysarthria or dysphasia. No inciting events and no recent illnesses or trauma.  Reviewed notes, labs and imaging from outside physicians, which showed:  CT HEAD WITHOUT AND WITH CONTRAST  TECHNIQUE: Contiguous axial images were obtained from the base of the skull through the vertex without and with intravenous contrast  CONTRAST: 6m OMNIPAQUE IOHEXOL 300 MG/ML SOLN  COMPARISON: None.  FINDINGS: A punctate focus of increased density in the region of the left sylvian fissure/operculum on a single image of the noncontrast CT  (series 2, image 16) does not persist on the postcontrast CT and is felt to be artifactual rather than reflecting a single tiny focus of hemorrhage. There is no evidence of acute cortical infarct, intracranial hemorrhage, mass, midline shift, or extra-axial fluid collection. Ventricles and sulci are within normal limits for age. No abnormal enhancement is identified.  Orbits are unremarkable. The visualized paranasal sinuses and mastoid air cells are clear. A few subcentimeter skull lesions are nonspecific but may reflect involvement by patient's underlying multiple myeloma. Mild calcified atherosclerosis is noted at the skullbase.  IMPRESSION: No evidence of acute intracranial abnormality.  MRI 07/2015: FINDINGS: Cervical Findings:  Grossly negative visualized brain parenchyma.  Skullbase through C3 levels appear stable and without tumor. Stable C4 compression fracture, no marrow edema or enhancement. C5 and C6 levels appear stable and without tumor. C7 level described in the thoracic section below.  No malignant cervical spinal stenosis. No cervical spinal cord signal abnormality. No cervical spine intradural enhancement. Visible paraspinal soft tissues appear stable without tumor.  CBC and CMP unremarkable, EGFR 88  Review of Systems: Patient complains of symptoms per HPI as well as the following symptoms: no CP, no SOB. Pertinent negatives per HPI. All others negative.   Social History   Social History  . Marital Status: Married    Spouse Name: LTreacy Holcomb  . Number of Children: 2  . Years of Education: 12   Occupational History  . Security    Social History Main Topics  . Smoking status: Current Every Day Smoker --  0.25 packs/day for 44 years    Types: Cigarettes  . Smokeless tobacco: Never Used  . Alcohol Use: No  . Drug Use: No  . Sexual Activity: No   Other Topics Concern  . Not on file   Social History Narrative   Lives with husband and grandson.      Ambulates with a walker at baseline.   Caffeine use: Coffee: 1-2 cup/day    Family History  Problem Relation Age of Onset  . Alcohol abuse    . Lung cancer Father     smoked  . Leukemia Mother   . Cirrhosis Sister   . Colon cancer Neg Hx   . Lung cancer Sister     smoked    Past Medical History  Diagnosis Date  . Depression   . Hypertension   . Hepatitis C     genotype 1  . Diverticulosis of colon   . Multiple myeloma     kappa light chain, s/p high-dose chemo/stem cell tx - [facility]  . Hx of adenomatous colonic polyps 2007  . Anxiety   . Osteoarthritis   . Sleep apnea   . Hyperlipidemia   . GERD with stricture   . External hemorrhoids   . Hepatitis B virus infection   . IBS (irritable bowel syndrome)   . Helicobacter pylori gastritis 2001    ? if treated  . Sciatic nerve pain     with Dr. Earle Gell  . Chronic low back pain   . Unspecified vitamin D deficiency 09/22/2013  . Allergy   . Fever 11/28/2013  . Dehydration 11/28/2013  . Nausea alone 01/22/2014  . Cough 08/02/2014  . DIVERTICULOSIS, COLON 08/31/2006  . DEPRESSION 08/31/2006    Qualifier: Diagnosis of  By: [name], [name]    . Muscle cramp 11/21/2014  . Chronic fatigue 03/07/2015  . Vitamin D deficiency 05/28/2015  . S/P radiation therapy 06/05/15-06/18/15    C7-T1   . Dysuria 10/08/2015  . Acute intractable headache 11/14/2015    Past Surgical History  Procedure Laterality Date  . Abdominal hysterectomy    . Oophorectomy    . Dilation and curettage of uterus    . Bone marrow transplant      2009, 2013 [facility]  . Appendectomy    . Colonoscopy w/ polypectomy  08/2006    1-2 adenomas, 6 polyps total, diverticulosis and external hemorrhoids  . Upper gastrointestinal endoscopy  08/2003    esophageal stricture dilation, hiatal hernia, gastrritis  . Cholecystectomy    . Tonsillectomy and adenoidectomy    . Femur im nail Right 08/22/2015    Procedure: INTRAMEDULLARY (IM) NAIL FEMORAL;  Surgeon: [name], MD;  Location: [facility];  Service: Orthopedics;  Laterality: Right;    Current Outpatient Prescriptions  Medication Sig Dispense Refill  . acyclovir (ZOVIRAX) 400 MG tablet Take 1 tablet (400 mg total) by mouth daily. 30 tablet 6  . ALPRAZolam (XANAX) 0.25 MG tablet Take 1 tablet (0.25 mg total) by mouth at bedtime as needed for anxiety. 60 tablet 0  . aspirin 81 MG tablet Take 81 mg by mouth daily.    . Cholecalciferol (VITAMIN D3) 5000 UNITS CAPS Take 5,000 Units by mouth daily.     . cyclobenzaprine (FLEXERIL) 10 MG tablet Take 1 tablet (10 mg total) by mouth 3 (three) times daily as needed for muscle spasms. 15 tablet 0  . dexamethasone (DECADRON) 4 MG tablet Take 4 mg by mouth daily.    Marland Kitchen  diazepam (VALIUM) 10 MG tablet Take 1 tablet (10 mg total) by mouth every 6 (six) hours as needed (muscle spasm). 20 tablet 0  . ipratropium (ATROVENT HFA) 17 MCG/ACT inhaler Inhale 2 puffs into the lungs every 4 (four) hours as needed for wheezing. 1 Inhaler 12  . levothyroxine (SYNTHROID, LEVOTHROID) 50 MCG tablet TAKE 1 TABLET (50 MCG TOTAL) BY MOUTH DAILY BEFORE BREAKFAST. 90 tablet 1  . losartan (COZAAR) 50 MG tablet Take 1 tablet (50 mg total) by mouth daily. 90 tablet 1  . magic mouthwash SOLN Take 10 mLs by mouth 4 (four) times daily as needed for mouth pain. Swish and spit  Or  Swish and swallow. 480 mL 2  . morphine (MS CONTIN) 100 MG 12 hr tablet Take 90 mg by mouth every 8 (eight) hours.     Marland Kitchen morphine (MSIR) 30 MG tablet Take 1-1.5 tablets (30-45 mg total) by mouth every 2 (two) hours as needed for moderate pain or severe pain. (Patient taking differently: Take 30-45 mg by mouth every 2 (two) hours as needed for moderate pain or severe pain. Take one tablet every 2 hours as needed for moderate pain) 30 tablet 0  . nicotine (NICODERM CQ - DOSED IN MG/24 HOURS) 14 mg/24hr patch Place 1 patch (14 mg total) onto the skin daily. 28 patch 0  . pomalidomide (POMALYST) 3 MG capsule Take 1 capsule  (3 mg total) by mouth daily. Take with water on days 1-21. Repeat every 28 days. 21 capsule 0  . senna-docusate (SENOKOT-S) 8.6-50 MG tablet Take 1 tablet by mouth daily.    Marland Kitchen tiotropium (SPIRIVA HANDIHALER) 18 MCG inhalation capsule Place 1 capsule (18 mcg total) into inhaler and inhale daily. 30 capsule 12  . [DISCONTINUED] amLODipine (NORVASC) 5 MG tablet Take 1 tablet (5 mg total) by mouth daily. 90 tablet 3  . [DISCONTINUED] gabapentin (NEURONTIN) 600 MG tablet Take 600 mg by mouth 3 (three) times daily.       No current facility-administered medications for this visit.    Allergies as of 01/02/2016 - Review Complete 01/02/2016  Allergen Reaction Noted  . Codeine Itching 03/06/2011  . Ibuprofen Other (See Comments) 08/14/2005  . Albuterol Hives 09/13/2014    Vitals: BP 180/102 mmHg  Pulse 94  Ht _0  (1.626 m)  Wt 156 lb (70.761 kg)  BMI 26.76 kg/m2 Last Weight:  Wt Readings from Last 1 Encounters:  01/02/16 156 lb (70.761 kg)   Last Height:   Ht Readings from Last 1 Encounters:  01/02/16 _1  (1.626 m)    Physical exam: Exam: Gen: NAD, conversant                     CV: RRR, no MRG. No Carotid Bruits. +peripheral edema, warm, nontender Eyes: Conjunctivae clear without exudates or hemorrhage  Neuro: Detailed Neurologic Exam  Speech:    Speech is normal; fluent and spontaneous with normal comprehension.  Cognition:    The patient is oriented to person, place, and time;     recent and remote memory intact;     language fluent;     normal attention, concentration,     fund of knowledge Cranial Nerves:    The pupils are equal, round, and reactive to light. Attempted fndoscopic exam couldn't visualize.  Visual fields are full to finger confrontation. Extraocular movements are intact. Trigeminal sensation is intact and the muscles of mastication are normal. The face is symmetric. The palate elevates in the midline.  Hearing intact. Voice is normal. Shoulder shrug is  normal. The tongue has normal motion without fasciculations.   Coordination:    Normal finger to nose and heel to shin.   Gait:    With walker, good stride  Motor Observation:    No asymmetry, no atrophy, and no involuntary movements noted. Tone:    Normal muscle tone.    Posture:    Posture is normal. normal erect    Strength: Bilateral right > left hip flexion weakness otherwise strength is V/V in the upper and lower limbs.      Sensation: intact to LT     Reflex Exam:  DTR's: Brisk in the uppers. 1+ left patellars and trace right patellar and absent AJs.     Toes:    The toes are downgoing bilaterally.   Clonus:    Clonus is absent.      Assessment/Plan:  New-onset right-sided occipital neuralgia and an neck pain in a 65 year old female with multiple myeloma and previous cervical compression fractures. Pain is unilateral right and is located in the distribution of the greater, lesser, third occipital and auricular nerves painful, sharp, with tenderness and trigger points at the emergence of the lesser occipital nerve and upper cervical spine. Given her multiple myeloma and previous history of cervical compression fracture and radiation to the cervical spine, need to rule out new cervical pathology causing impingement of the occipital nerve at C2-C3 as well as any new lesion at the skull base causing entrapment. Other causes of occipital neuralgia include pain in the upper cervical joints or disks, suboccipital or upper posterior neck muscles including the traps/scm, spinal and posterior cranial fossa dura mater, vertebral arteries, structural and infiltrative lesions such as meningioma, schwannoma, myelitis, compressive disk disease and others. Will need MRI of the brain and cervical spine. Neurontin prn for the pain. Common side effects include dizziness, drowsiness, weakness, tired feeling, nausea, diarrhea, constipation, blurred vision, headache, breast swelling, dry mouth, loss  of balance or coordination. Stop for anything concerning.  Sarina Ill, MD  [facility] [address] [place],  [address]  Phone [phone] Fax [phone]

## 2016-01-04 ENCOUNTER — Emergency Department (HOSPITAL_COMMUNITY)
Admission: EM | Admit: 2016-01-04 | Discharge: 2016-01-04 | Disposition: A | Discharge status: Home / Self Care | Payer: Medicare Other | Attending: Emergency Medicine | Admitting: Emergency Medicine

## 2016-01-04 ENCOUNTER — Encounter (HOSPITAL_COMMUNITY): Payer: Self-pay

## 2016-01-04 DIAGNOSIS — Z923 Personal history of irradiation: Secondary | ICD-10-CM | POA: Diagnosis not present

## 2016-01-04 DIAGNOSIS — M1990 Unspecified osteoarthritis, unspecified site: Secondary | ICD-10-CM | POA: Insufficient documentation

## 2016-01-04 DIAGNOSIS — Z8619 Personal history of other infectious and parasitic diseases: Secondary | ICD-10-CM | POA: Diagnosis not present

## 2016-01-04 DIAGNOSIS — Z8579 Personal history of other malignant neoplasms of lymphoid, hematopoietic and related tissues: Secondary | ICD-10-CM | POA: Insufficient documentation

## 2016-01-04 DIAGNOSIS — F329 Major depressive disorder, single episode, unspecified: Secondary | ICD-10-CM | POA: Diagnosis not present

## 2016-01-04 DIAGNOSIS — Z86010 Personal history of colonic polyps: Secondary | ICD-10-CM | POA: Insufficient documentation

## 2016-01-04 DIAGNOSIS — M62838 Other muscle spasm: Secondary | ICD-10-CM | POA: Diagnosis not present

## 2016-01-04 DIAGNOSIS — I10 Essential (primary) hypertension: Secondary | ICD-10-CM | POA: Diagnosis not present

## 2016-01-04 DIAGNOSIS — Z79891 Long term (current) use of opiate analgesic: Secondary | ICD-10-CM | POA: Diagnosis not present

## 2016-01-04 DIAGNOSIS — F17210 Nicotine dependence, cigarettes, uncomplicated: Secondary | ICD-10-CM | POA: Insufficient documentation

## 2016-01-04 DIAGNOSIS — Z8719 Personal history of other diseases of the digestive system: Secondary | ICD-10-CM | POA: Insufficient documentation

## 2016-01-04 DIAGNOSIS — Z7982 Long term (current) use of aspirin: Secondary | ICD-10-CM | POA: Diagnosis not present

## 2016-01-04 DIAGNOSIS — G8929 Other chronic pain: Secondary | ICD-10-CM | POA: Insufficient documentation

## 2016-01-04 DIAGNOSIS — Z79899 Other long term (current) drug therapy: Secondary | ICD-10-CM | POA: Insufficient documentation

## 2016-01-04 DIAGNOSIS — M542 Cervicalgia: Secondary | ICD-10-CM | POA: Insufficient documentation

## 2016-01-04 DIAGNOSIS — E559 Vitamin D deficiency, unspecified: Secondary | ICD-10-CM | POA: Insufficient documentation

## 2016-01-04 DIAGNOSIS — F419 Anxiety disorder, unspecified: Secondary | ICD-10-CM | POA: Insufficient documentation

## 2016-01-04 MED ORDER — FENTANYL CITRATE (PF) 100 MCG/2ML IJ SOLN
75.0000 ug | Freq: Once | INTRAMUSCULAR | Status: AC
  Administered 2016-01-04: 75 ug via INTRAMUSCULAR
  Filled 2016-01-04: qty 2

## 2016-01-04 MED ORDER — KETOROLAC TROMETHAMINE 30 MG/ML IJ SOLN
30.0000 mg | Freq: Once | INTRAMUSCULAR | Status: AC
  Administered 2016-01-04: 30 mg via INTRAMUSCULAR
  Filled 2016-01-04: qty 1

## 2016-01-04 MED ORDER — IBUPROFEN 600 MG PO TABS: 600.0000 mg | ORAL_TABLET | Freq: Three times a day (TID) | ORAL | Status: DC | PRN

## 2016-01-04 NOTE — ED Provider Notes (Signed)
CSN: [id number]     Arrival date & time 01/04/16  1605 History   First MD Initiated Contact with Patient 01/04/16 1802     Chief Complaint  Patient presents with  . Neck Pain    HPI   Kendra Johns is an 65 y.o. female with complex medical history including Hep C, multiple myeloma (follows with onc at [facility]), OA, chronic pain, who presents to the ED for evaluation of right neck pain/spasm. She has been seen in the ED for this before and was subsequently referred to neurology who diagnosed her with a pinched nerve. Outpatient MRI pending. She states she has been taking valium and gabapentin as prescribed with no relief. She states she also takes a lot of narcotics at home for chronic pain which are not alleviating her pain at all. She states that last time in the ED she was given a shot which helped immensely. She denies new injury or trauma. Denies new numbness, weakness, tingling. Denies fever, chills.  Past Medical History  Diagnosis Date  . Depression   . Hypertension   . Hepatitis C     genotype 1  . Diverticulosis of colon   . Multiple myeloma     kappa light chain, s/p high-dose chemo/stem cell tx - [facility]  . Hx of adenomatous colonic polyps 2007  . Anxiety   . Osteoarthritis   . Sleep apnea   . Hyperlipidemia   . GERD with stricture   . External hemorrhoids   . Hepatitis B virus infection   . IBS (irritable bowel syndrome)   . Helicobacter pylori gastritis 2001    ? if treated  . Sciatic nerve pain     with Dr. Earle Gell  . Chronic low back pain   . Unspecified vitamin D deficiency 09/22/2013  . Allergy   . Fever 11/28/2013  . Dehydration 11/28/2013  . Nausea alone 01/22/2014  . Cough 08/02/2014  . DIVERTICULOSIS, COLON 08/31/2006  . DEPRESSION 08/31/2006    Qualifier: Diagnosis of  By: [name], [name]    . Muscle cramp 11/21/2014  . Chronic fatigue 03/07/2015  . Vitamin D deficiency 05/28/2015  . S/P radiation therapy 06/05/15-06/18/15    C7-T1   . Dysuria 10/08/2015  . Acute  intractable headache 11/14/2015   Past Surgical History  Procedure Laterality Date  . Abdominal hysterectomy    . Oophorectomy    . Dilation and curettage of uterus    . Bone marrow transplant      2009, 2013 [facility]  . Appendectomy    . Colonoscopy w/ polypectomy  08/2006    1-2 adenomas, 6 polyps total, diverticulosis and external hemorrhoids  . Upper gastrointestinal endoscopy  08/2003    esophageal stricture dilation, hiatal hernia, gastrritis  . Cholecystectomy    . Tonsillectomy and adenoidectomy    . Femur im nail Right 08/22/2015    Procedure: INTRAMEDULLARY (IM) NAIL FEMORAL;  Surgeon: [name], MD;  Location: [facility];  Service: Orthopedics;  Laterality: Right;   Family History  Problem Relation Age of Onset  . Alcohol abuse    . Lung cancer Father     smoked  . Leukemia Mother   . Cirrhosis Sister   . Colon cancer Neg Hx   . Lung cancer Sister     smoked   Social History  Substance Use Topics  . Smoking status: Current Every Day Smoker -- 0.25 packs/day for 44 years    Types: Cigarettes  . Smokeless tobacco: Never  Used  . Alcohol Use: No   OB History    No data available     Review of Systems  All other systems reviewed and are negative.     Allergies  Codeine; Ibuprofen; and Albuterol  Home Medications   Prior to Admission medications   Medication Sig Start Date End Date Taking? Authorizing Provider  acyclovir (ZOVIRAX) 400 MG tablet Take 1 tablet (400 mg total) by mouth daily. 11/27/15   Heath Lark, MD  ALPRAZolam (XANAX) 0.25 MG tablet Take 1 tablet (0.25 mg total) by mouth at bedtime as needed for anxiety. 11/27/15   Heath Lark, MD  aspirin 81 MG tablet Take 81 mg by mouth daily.    Historical Provider, MD  Cholecalciferol (VITAMIN D3) 5000 UNITS CAPS Take 5,000 Units by mouth daily.     Historical Provider, MD  cyclobenzaprine (FLEXERIL) 10 MG tablet Take 1 tablet (10 mg total) by mouth 3 (three) times daily as needed for muscle spasms.  08/26/15   Geradine Girt, DO  dexamethasone (DECADRON) 4 MG tablet Take 4 mg by mouth daily. 12/24/15   Historical Provider, MD  diazepam (VALIUM) 10 MG tablet Take 1 tablet (10 mg total) by mouth every 6 (six) hours as needed (muscle spasm). 12/31/15   Daleen Bo, MD  gabapentin (NEURONTIN) 300 MG capsule Take 1 capsule (300 mg total) by mouth 3 (three) times daily. 01/02/16   Melvenia Beam, MD  ipratropium (ATROVENT HFA) 17 MCG/ACT inhaler Inhale 2 puffs into the lungs every 4 (four) hours as needed for wheezing. 09/13/14   Heath Lark, MD  levothyroxine (SYNTHROID, LEVOTHROID) 50 MCG tablet TAKE 1 TABLET (50 MCG TOTAL) BY MOUTH DAILY BEFORE BREAKFAST. 10/30/15   Heath Lark, MD  losartan (COZAAR) 50 MG tablet Take 1 tablet (50 mg total) by mouth daily. 10/29/15   Marin Olp, MD  magic mouthwash SOLN Take 10 mLs by mouth 4 (four) times daily as needed for mouth pain. Swish and spit  Or  Swish and swallow. 07/10/15   Heath Lark, MD  morphine (MS CONTIN) 100 MG 12 hr tablet Take 90 mg by mouth every 8 (eight) hours.     Historical Provider, MD  morphine (MSIR) 30 MG tablet Take 1-1.5 tablets (30-45 mg total) by mouth every 2 (two) hours as needed for moderate pain or severe pain. Patient taking differently: Take 30-45 mg by mouth every 2 (two) hours as needed for moderate pain or severe pain. Take one tablet every 2 hours as needed for moderate pain 08/26/15   Geradine Girt, DO  nicotine (NICODERM CQ - DOSED IN MG/24 HOURS) 14 mg/24hr patch Place 1 patch (14 mg total) onto the skin daily. 09/17/15   [name], MD  pomalidomide (POMALYST) 3 MG capsule Take 1 capsule (3 mg total) by mouth daily. Take with water on days 1-21. Repeat every 28 days. 12/25/15   Heath Lark, MD  senna-docusate (SENOKOT-S) 8.6-50 MG tablet Take 1 tablet by mouth daily. 08/26/15   Geradine Girt, DO  tiotropium (SPIRIVA HANDIHALER) 18 MCG inhalation capsule Place 1 capsule (18 mcg total) into inhaler and inhale daily.  12/03/15   Marin Olp, MD   BP 160/103 mmHg  Pulse 85  Temp(Src) 97.7 F (36.5 C) (Oral)  Resp 20  SpO2 95% Physical Exam  Constitutional: She is oriented to person, place, and time. No distress.  HENT:  Head: Atraumatic.  Right Ear: External ear normal.  Left Ear: External ear normal.  Nose: Nose normal.  Eyes: Conjunctivae are normal. No scleral icterus.  Neck: Normal range of motion. Neck supple.  Right lateral neck diffusely TTP with spasm. FROM.   Cardiovascular: Normal rate and regular rhythm.   Pulmonary/Chest: Effort normal. No respiratory distress. She exhibits no tenderness.  Abdominal: Soft. She exhibits no distension. There is no tenderness.  Neurological: She is alert and oriented to person, place, and time.  Skin: Skin is warm and dry. She is not diaphoretic.  Psychiatric: She has a normal mood and affect. Her behavior is normal.  Nursing note and vitals reviewed.   ED Course  Procedures (including critical care time) Labs Review Labs Reviewed - No data to display  Imaging Review No results found. I have personally reviewed and evaluated these images and lab results as part of my medical decision-making.   EKG Interpretation None      MDM   Final diagnoses:  Neck pain on right side    Pt is an 64 y.o. female being followed by neurology for right sided neck pain thought likely to be secondary to pinched nerve. MRI is pending. She has no new symptoms. No new injury or trauma. No focal neuro findings. VS unremarkable and stable. Pain resolved with fentanyl and toradol. Encouraged continuing to take muscle relaxers and gabapentin as prescribed. Pt states she has not been taking an NSAID at home. Rx given for ibuprofen prn. Instructed to f/u with PCP and neuro. ER return precautions given.    Anne Ng, PA-C 01/06/16 0008  Leo Grosser, MD 01/06/16 [phone]

## 2016-01-04 NOTE — Discharge Instructions (Signed)
You were seen in the ER today for evaluation of neck pain. Your pain resolved with medications here. I will give you a small prescription for ibuprofen which works similarly to one of the medications we gave you here to help with pain/inflammation. You may continue taking your other pain medications as prescribed. Please follow up with your primary care provider and neurologist as scheduled. Return to the ER for new or worsening symptoms.

## 2016-01-04 NOTE — ED Notes (Signed)
She states she was seen here and subsequently by a "neurologist" for neck pain.   They theorize she has a "pinched nerve".  Her c/o today is that the meds prescribed "are not helping with the pain".

## 2016-01-05 ENCOUNTER — Encounter: Payer: Self-pay | Admitting: Neurology

## 2016-01-06 DIAGNOSIS — M47812 Spondylosis without myelopathy or radiculopathy, cervical region: Secondary | ICD-10-CM | POA: Diagnosis not present

## 2016-01-06 DIAGNOSIS — M5481 Occipital neuralgia: Secondary | ICD-10-CM | POA: Diagnosis not present

## 2016-01-07 ENCOUNTER — Ambulatory Visit: Payer: Medicare Other

## 2016-01-07 ENCOUNTER — Ambulatory Visit (HOSPITAL_BASED_OUTPATIENT_CLINIC_OR_DEPARTMENT_OTHER): Payer: Medicare Other

## 2016-01-07 ENCOUNTER — Ambulatory Visit (HOSPITAL_BASED_OUTPATIENT_CLINIC_OR_DEPARTMENT_OTHER): Payer: Medicare Other | Admitting: Hematology and Oncology

## 2016-01-07 ENCOUNTER — Encounter: Payer: Self-pay | Admitting: Hematology and Oncology

## 2016-01-07 ENCOUNTER — Other Ambulatory Visit (HOSPITAL_BASED_OUTPATIENT_CLINIC_OR_DEPARTMENT_OTHER): Payer: Medicare Other

## 2016-01-07 VITALS — BP 116/68 | HR 105 | Temp 98.4°F | Resp 18 | Wt 157.9 lb

## 2016-01-07 VITALS — BP 114/63 | HR 87 | Temp 98.4°F | Resp 16

## 2016-01-07 DIAGNOSIS — C9002 Multiple myeloma in relapse: Secondary | ICD-10-CM

## 2016-01-07 DIAGNOSIS — Z95828 Presence of other vascular implants and grafts: Secondary | ICD-10-CM

## 2016-01-07 DIAGNOSIS — M5481 Occipital neuralgia: Secondary | ICD-10-CM | POA: Diagnosis not present

## 2016-01-07 DIAGNOSIS — Z5112 Encounter for antineoplastic immunotherapy: Secondary | ICD-10-CM

## 2016-01-07 DIAGNOSIS — Z720 Tobacco use: Secondary | ICD-10-CM

## 2016-01-07 LAB — COMPREHENSIVE METABOLIC PANEL
ALK PHOS: 133 U/L (ref 40–150)
ALT: 15 U/L (ref 0–55)
ANION GAP: 11 meq/L (ref 3–11)
AST: 17 U/L (ref 5–34)
Albumin: 3 g/dL — ABNORMAL LOW (ref 3.5–5.0)
BILIRUBIN TOTAL: 0.35 mg/dL (ref 0.20–1.20)
BUN: 8.5 mg/dL (ref 7.0–26.0)
CO2: 19 meq/L — AB (ref 22–29)
Calcium: 8.6 mg/dL (ref 8.4–10.4)
Chloride: 109 mEq/L (ref 98–109)
Creatinine: 0.8 mg/dL (ref 0.6–1.1)
GLUCOSE: 163 mg/dL — AB (ref 70–140)
POTASSIUM: 4 meq/L (ref 3.5–5.1)
SODIUM: 139 meq/L (ref 136–145)
TOTAL PROTEIN: 5.7 g/dL — AB (ref 6.4–8.3)

## 2016-01-07 LAB — CBC WITH DIFFERENTIAL/PLATELET
BASO%: 0.3 % (ref 0.0–2.0)
BASOS ABS: 0 10*3/uL (ref 0.0–0.1)
EOS ABS: 0.1 10*3/uL (ref 0.0–0.5)
EOS%: 2.3 % (ref 0.0–7.0)
HCT: 38.2 % (ref 34.8–46.6)
HGB: 12.1 g/dL (ref 11.6–15.9)
LYMPH%: 11.3 % — AB (ref 14.0–49.7)
MCH: 30.5 pg (ref 25.1–34.0)
MCHC: 31.7 g/dL (ref 31.5–36.0)
MCV: 96.2 fL (ref 79.5–101.0)
MONO#: 1 10*3/uL — AB (ref 0.1–0.9)
MONO%: 16.2 % — AB (ref 0.0–14.0)
NEUT#: 4.3 10*3/uL (ref 1.5–6.5)
NEUT%: 69.9 % (ref 38.4–76.8)
PLATELETS: 211 10*3/uL (ref 145–400)
RBC: 3.97 10*6/uL (ref 3.70–5.45)
RDW: 16.5 % — ABNORMAL HIGH (ref 11.2–14.5)
WBC: 6.1 10*3/uL (ref 3.9–10.3)
lymph#: 0.7 10*3/uL — ABNORMAL LOW (ref 0.9–3.3)

## 2016-01-07 MED ORDER — SODIUM CHLORIDE 0.9% FLUSH
10.0000 mL | INTRAVENOUS | Status: DC | PRN
  Administered 2016-01-07: 10 mL
  Filled 2016-01-07: qty 10

## 2016-01-07 MED ORDER — DIPHENHYDRAMINE HCL 25 MG PO CAPS
ORAL_CAPSULE | ORAL | Status: AC
  Filled 2016-01-07: qty 2

## 2016-01-07 MED ORDER — PROCHLORPERAZINE MALEATE 10 MG PO TABS
ORAL_TABLET | ORAL | Status: AC
  Filled 2016-01-07: qty 1

## 2016-01-07 MED ORDER — SODIUM CHLORIDE 0.9 % IV SOLN
Freq: Once | INTRAVENOUS | Status: AC
  Administered 2016-01-07: 10:00:00 via INTRAVENOUS

## 2016-01-07 MED ORDER — SODIUM CHLORIDE 0.9% FLUSH
10.0000 mL | INTRAVENOUS | Status: DC | PRN
  Administered 2016-01-07: 10 mL via INTRAVENOUS
  Filled 2016-01-07: qty 10

## 2016-01-07 MED ORDER — METHYLPREDNISOLONE SODIUM SUCC 125 MG IJ SOLR
125.0000 mg | Freq: Once | INTRAMUSCULAR | Status: AC
  Administered 2016-01-07: 125 mg via INTRAVENOUS

## 2016-01-07 MED ORDER — PROCHLORPERAZINE MALEATE 10 MG PO TABS
10.0000 mg | ORAL_TABLET | Freq: Once | ORAL | Status: AC
  Administered 2016-01-07: 10 mg via ORAL

## 2016-01-07 MED ORDER — HEPARIN SOD (PORK) LOCK FLUSH 100 UNIT/ML IV SOLN
500.0000 [IU] | Freq: Once | INTRAVENOUS | Status: AC | PRN
  Administered 2016-01-07: 500 [IU]
  Filled 2016-01-07: qty 5

## 2016-01-07 MED ORDER — DIPHENHYDRAMINE HCL 25 MG PO CAPS
50.0000 mg | ORAL_CAPSULE | Freq: Once | ORAL | Status: AC
  Administered 2016-01-07: 50 mg via ORAL

## 2016-01-07 MED ORDER — METHYLPREDNISOLONE SODIUM SUCC 125 MG IJ SOLR
INTRAMUSCULAR | Status: AC
  Filled 2016-01-07: qty 2

## 2016-01-07 MED ORDER — ACETAMINOPHEN 325 MG PO TABS
ORAL_TABLET | ORAL | Status: AC
  Filled 2016-01-07: qty 2

## 2016-01-07 MED ORDER — SODIUM CHLORIDE 0.9 % IV SOLN
17.0000 mg/kg | Freq: Once | INTRAVENOUS | Status: AC
  Administered 2016-01-07: 1200 mg via INTRAVENOUS
  Filled 2016-01-07: qty 60

## 2016-01-07 MED ORDER — LIDOCAINE-PRILOCAINE 2.5-2.5 % EX CREA
1.0000 | TOPICAL_CREAM | CUTANEOUS | Status: DC | PRN
Start: 2016-01-07 — End: 2016-11-26

## 2016-01-07 MED ORDER — ACETAMINOPHEN 325 MG PO TABS
650.0000 mg | ORAL_TABLET | Freq: Once | ORAL | Status: AC
  Administered 2016-01-07: 650 mg via ORAL

## 2016-01-07 NOTE — Patient Instructions (Signed)
[facility] Discharge Instructions for Patients Receiving Chemotherapy  Today you received the following chemotherapy agents darzalex  To help prevent nausea and vomiting after your treatment, we encourage you to take your nausea medication as directed  If you develop nausea and vomiting that is not controlled by your nausea medication, call the clinic.   BELOW ARE SYMPTOMS THAT SHOULD BE REPORTED IMMEDIATELY:  *FEVER GREATER THAN 100.5 F  *CHILLS WITH OR WITHOUT FEVER  NAUSEA AND VOMITING THAT IS NOT CONTROLLED WITH YOUR NAUSEA MEDICATION  *UNUSUAL SHORTNESS OF BREATH  *UNUSUAL BRUISING OR BLEEDING  TENDERNESS IN MOUTH AND THROAT WITH OR WITHOUT PRESENCE OF ULCERS  *URINARY PROBLEMS  *BOWEL PROBLEMS  UNUSUAL RASH Items with * indicate a potential emergency and should be followed up as soon as possible.  Feel free to call the clinic you have any questions or concerns. The clinic phone number is [phone].  

## 2016-01-07 NOTE — Patient Instructions (Signed)

## 2016-01-09 NOTE — Progress Notes (Signed)
[facility] OFFICE PROGRESS NOTE  Patient Care Team: Shelva Majestic, MD as PCP - General (Family Medicine) Eddie Candle, MD as Consulting Physician (Medical Oncology) Shelva Majestic, MD as Consulting Physician (Family Medicine) Artis Delay, MD as Consulting Physician (Hematology and Oncology)  SUMMARY OF ONCOLOGIC HISTORY: Oncology History   Multiple myeloma, kappa light chain disease, Durie-Salmon stage III       Multiple myeloma in relapse (HCC)   07/16/2006 Bone Marrow Biopsy BM biopsy is non-diagnostic   09/21/2006 Procedure L5 vertebral biopsy 5% plasma cell   10/25/2006 Bone Marrow Biopsy BM biopsy is hypercellular with 5% plasma cell   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   01/03/2007 - 01/10/2007 Radiation Therapy Approximate date only, received RT for plasmacytoma followed by surgery   09/14/2007 Initial Diagnosis MULTIPLE  MYELOMA   10/05/2007 Bone Marrow Biopsy Bm biopsy was negative   06/18/2008 Bone Marrow Biopsy BM biopsy was negative   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 [facility]   04/13/2011 Relapse/Recurrence Disease relapse   04/14/2011 Bone Marrow Biopsy Bm biopsy showed 2 % plasma cell   04/27/2011 Relapse/Recurrence Disease relapse, treated with Velcade/Cytoxan/Dex   09/07/2011 - 07/28/2013 Chemotherapy She has been receiving Velcade   12/27/2011 Bone Marrow Transplant 2nd transplant at [facility]   07/28/2013 Relapse/Recurrence Chemo is stopped due to progression of disease   08/29/2013 Imaging PEt/CT showed recurrence of disease with new lesion on her rib with compression fracture   09/13/2013 Bone Marrow Biopsy BM biopsy is hypercellular with 6% plasma cell   10/10/2013 - 10/20/2013 Radiation Therapy Started on palliative XRT for rib pain   11/27/2013 - 04/06/2014 Chemotherapy The patient starts chemotherapy with Carfilzomib   07/19/2014 Imaging Repeat bloodwork and PET/CT scan shows significant disease progression.    08/02/2014 - 03/06/2015 Chemotherapy She enrolled in clinical trial using combination therapy with Revlimid, dexamethasone and Elotuzumab   03/07/2015 - 06/04/2015 Chemotherapy She is on maintenance Revlimid only without dexamethasone   05/27/2015 Imaging  MRI spine showed new compression fracture.   06/04/2015 - 06/18/2015 Radiation Therapy  she received palliative radiation therapy to 25 Gy C7-T1   07/10/2015 - 08/07/2015 Chemotherapy She received Elotuzumab, dex and Revlimid. Rx is stopped due to progression   08/01/2015 Imaging MRI of the spine was performed today due to new onset of worsening right hip pain/flank area. MRI show mild progression of the L1 vertebral body.   08/21/2015 - 08/26/2015 Hospital Admission She was admitted to the hospital due to pathologic fracture to right femur   08/22/2015 Surgery She had IM nail to fractured femur   08/23/2015 - 09/20/2015 Radiation Therapy She received XRT to her L1 L2 and Right Femur   09/05/2015 - 09/06/2015 Hospital Admission She had recurrent admission due to displaced fracture, managed conservatively   09/12/2015 - 09/17/2015 Hospital Admission She had recurrent admission for COPD exacerbation   09/30/2015 -  Chemotherapy She was started on Pomalyst   12/10/2015 -  Chemotherapy She started weekly Daratumumab along with Pomalyst    INTERVAL HISTORY: Please see below for problem oriented charting. She is seen prior to chemo Head ache and neck pain are better No recent infection or side effects of chemo  REVIEW OF SYSTEMS:   Constitutional: Denies fevers, chills or abnormal weight loss Eyes: Denies blurriness of vision Ears, nose, mouth, throat, and face: Denies mucositis or sore throat Respiratory: Denies cough, dyspnea or wheezes Cardiovascular: Denies palpitation, chest discomfort or lower extremity swelling Gastrointestinal:  Denies  nausea, heartburn or change in bowel habits Skin: Denies abnormal skin rashes Lymphatics: Denies new lymphadenopathy  or easy bruising Neurological:Denies numbness, tingling or new weaknesses Behavioral/Psych: Mood is stable, no new changes  All other systems were reviewed with the patient and are negative.  I have reviewed the past medical history, past surgical history, social history and family history with the patient and they are unchanged from previous note.  ALLERGIES:  is allergic to codeine; ibuprofen; and albuterol.  MEDICATIONS:  Current Outpatient Prescriptions  Medication Sig Dispense Refill  . acyclovir (ZOVIRAX) 400 MG tablet Take 1 tablet (400 mg total) by mouth daily. 30 tablet 6  . ALPRAZolam (XANAX) 0.25 MG tablet Take 1 tablet (0.25 mg total) by mouth at bedtime as needed for anxiety. 60 tablet 0  . aspirin 81 MG tablet Take 81 mg by mouth daily.    . Cholecalciferol (VITAMIN D3) 5000 UNITS CAPS Take 5,000 Units by mouth daily.     . cyclobenzaprine (FLEXERIL) 10 MG tablet Take 1 tablet (10 mg total) by mouth 3 (three) times daily as needed for muscle spasms. 15 tablet 0  . dexamethasone (DECADRON) 4 MG tablet Take 4 mg by mouth daily.    . diazepam (VALIUM) 10 MG tablet Take 1 tablet (10 mg total) by mouth every 6 (six) hours as needed (muscle spasm). 20 tablet 0  . gabapentin (NEURONTIN) 300 MG capsule Take 1 capsule (300 mg total) by mouth 3 (three) times daily. 90 capsule 11  . ibuprofen (ADVIL,MOTRIN) 600 MG tablet Take 1 tablet (600 mg total) by mouth every 8 (eight) hours as needed. 20 tablet 0  . ipratropium (ATROVENT HFA) 17 MCG/ACT inhaler Inhale 2 puffs into the lungs every 4 (four) hours as needed for wheezing. 1 Inhaler 12  . levothyroxine (SYNTHROID, LEVOTHROID) 50 MCG tablet TAKE 1 TABLET (50 MCG TOTAL) BY MOUTH DAILY BEFORE BREAKFAST. 90 tablet 1  . lidocaine-prilocaine (EMLA) cream Apply 1 application topically as needed. 30 g 6  . losartan (COZAAR) 50 MG tablet Take 1 tablet (50 mg total) by mouth daily. 90 tablet 1  . magic mouthwash SOLN Take 10 mLs by mouth 4 (four)  times daily as needed for mouth pain. Swish and spit  Or  Swish and swallow. 480 mL 2  . morphine (MS CONTIN) 100 MG 12 hr tablet Take 90 mg by mouth every 8 (eight) hours.     Marland Kitchen morphine (MSIR) 30 MG tablet Take 1-1.5 tablets (30-45 mg total) by mouth every 2 (two) hours as needed for moderate pain or severe pain. (Patient taking differently: Take 30-45 mg by mouth every 2 (two) hours as needed for moderate pain or severe pain. Take one tablet every 2 hours as needed for moderate pain) 30 tablet 0  . nicotine (NICODERM CQ - DOSED IN MG/24 HOURS) 14 mg/24hr patch Place 1 patch (14 mg total) onto the skin daily. 28 patch 0  . pomalidomide (POMALYST) 3 MG capsule Take 1 capsule (3 mg total) by mouth daily. Take with water on days 1-21. Repeat every 28 days. 21 capsule 0  . senna-docusate (SENOKOT-S) 8.6-50 MG tablet Take 1 tablet by mouth daily.    Marland Kitchen tiotropium (SPIRIVA HANDIHALER) 18 MCG inhalation capsule Place 1 capsule (18 mcg total) into inhaler and inhale daily. 30 capsule 12  . [DISCONTINUED] amLODipine (NORVASC) 5 MG tablet Take 1 tablet (5 mg total) by mouth daily. 90 tablet 3   No current facility-administered medications for this visit.  PHYSICAL EXAMINATION: ECOG PERFORMANCE STATUS: 1 - Symptomatic but completely ambulatory  Filed Vitals:   01/07/16 0848  BP: 116/68  Pulse: 105  Temp: 98.4 F (36.9 C)  Resp: 18   Filed Weights   01/07/16 0848  Weight: 157 lb 14.4 oz (71.623 kg)    GENERAL:alert, no distress and comfortable SKIN: skin color, texture, turgor are normal, no rashes or significant lesions EYES: normal, Conjunctiva are pink and non-injected, sclera clear OROPHARYNX:no exudate, no erythema and lips, buccal mucosa, and tongue normal  NECK: supple, thyroid normal size, non-tender, without nodularity LYMPH:  no palpable lymphadenopathy in the cervical, axillary or inguinal LUNGS: clear to auscultation and percussion with normal breathing effort HEART: regular  rate & rhythm and no murmurs and no lower extremity edema ABDOMEN:abdomen soft, non-tender and normal bowel sounds Musculoskeletal:no cyanosis of digits and no clubbing  NEURO: alert & oriented x 3 with fluent speech, no focal motor/sensory deficits  LABORATORY DATA:  I have reviewed the data as listed    Component Value Date/Time   NA 139 01/07/2016 0824   NA 141 09/11/2015 2255   K 4.0 01/07/2016 0824   K 4.3 09/11/2015 2255   CL 107 09/11/2015 2255   CL 107 03/10/2013 1014   CO2 19* 01/07/2016 0824   CO2 27 09/11/2015 2255   GLUCOSE 163* 01/07/2016 0824   GLUCOSE 106* 09/11/2015 2255   GLUCOSE 111* 03/10/2013 1014   GLUCOSE 98 08/31/2006 1024   BUN 8.5 01/07/2016 0824   BUN 16 09/11/2015 2255   CREATININE 0.8 01/07/2016 0824   CREATININE 0.72 09/12/2015 0620   CALCIUM 8.6 01/07/2016 0824   CALCIUM 9.1 09/11/2015 2255   PROT 5.7* 01/07/2016 0824   PROT 5.7* 12/31/2015 0842   PROT 6.6 09/11/2014 0336   ALBUMIN 3.0* 01/07/2016 0824   ALBUMIN 3.4* 09/11/2014 0336   AST 17 01/07/2016 0824   AST 18 09/11/2014 0336   ALT 15 01/07/2016 0824   ALT 18 09/11/2014 0336   ALKPHOS 133 01/07/2016 0824   ALKPHOS 113 09/11/2014 0336   BILITOT 0.35 01/07/2016 0824   BILITOT 0.6 09/11/2014 0336   GFRNONAA >60 09/12/2015 0620   GFRAA >60 09/12/2015 0620    No results found for: SPEP, UPEP  Lab Results  Component Value Date   WBC 6.1 01/07/2016   NEUTROABS 4.3 01/07/2016   HGB 12.1 01/07/2016   HCT 38.2 01/07/2016   MCV 96.2 01/07/2016   PLT 211 01/07/2016      Chemistry      Component Value Date/Time   NA 139 01/07/2016 0824   NA 141 09/11/2015 2255   K 4.0 01/07/2016 0824   K 4.3 09/11/2015 2255   CL 107 09/11/2015 2255   CL 107 03/10/2013 1014   CO2 19* 01/07/2016 0824   CO2 27 09/11/2015 2255   BUN 8.5 01/07/2016 0824   BUN 16 09/11/2015 2255   CREATININE 0.8 01/07/2016 0824   CREATININE 0.72 09/12/2015 0620      Component Value Date/Time   CALCIUM 8.6  01/07/2016 0824   CALCIUM 9.1 09/11/2015 2255   ALKPHOS 133 01/07/2016 0824   ALKPHOS 113 09/11/2014 0336   AST 17 01/07/2016 0824   AST 18 09/11/2014 0336   ALT 15 01/07/2016 0824   ALT 18 09/11/2014 0336   BILITOT 0.35 01/07/2016 0824   BILITOT 0.6 09/11/2014 0336      ASSESSMENT & PLAN:  Multiple myeloma in relapse ([place]) I reviewed the most recent blood work with her.  Her serum light chain is stable and she is responding well to treatment She has no recurrence of admission to the hospital. Her pain appears to be under control. She was started Daratumumab last week and tolerated treatment well. I told her to discontinue dexamethasone. We will continue Pomalidomide for 21 days on, 7 days off along with weekly Daratumumab  Occipital neuralgia of right side She have chronic back pain, head ache, neck pain and diffuse bone pain. Examination today is benign. I recommend her to continue her pain medicine and monitor carefully. She is being evaluated by neurologist for head ache  I told her to stop dex    Tobacco abuse One of the recent, acute COPD exacerbation nearly caused respiratory failure to the point of death. She had recurrent admission to the hospital last year When she was sick, she quit smoking. Now that she is better, she resumed smoking again. Again, I spent some time explaining to her the importance of nicotine cessation. I encouraged her to use her nicotine patches.   No orders of the defined types were placed in this encounter.   All questions were answered. The patient knows to call the clinic with any problems, questions or concerns. No barriers to learning was detected. I spent 15 minutes counseling the patient face to face. The total time spent in the appointment was 20 minutes and more than 50% was on counseling and review of test results     [facility], Tamora, MD 01/09/2016 6:36 AM

## 2016-01-09 NOTE — Assessment & Plan Note (Signed)
She have chronic back pain, head ache, neck pain and diffuse bone pain. Examination today is benign. I recommend her to continue her pain medicine and monitor carefully. She is being evaluated by neurologist for head ache  I told her to stop dex

## 2016-01-09 NOTE — Assessment & Plan Note (Signed)
One of the recent, acute COPD exacerbation nearly caused respiratory failure to the point of death. She had recurrent admission to the hospital last year When she was sick, she quit smoking. Now that she is better, she resumed smoking again. Again, I spent some time explaining to her the importance of nicotine cessation. I encouraged her to use her nicotine patches.

## 2016-01-09 NOTE — Assessment & Plan Note (Signed)
I reviewed the most recent blood work with her. Her serum light chain is stable and she is responding well to treatment She has no recurrence of admission to the hospital. Her pain appears to be under control. She was started Daratumumab last week and tolerated treatment well. I told her to discontinue dexamethasone. We will continue Pomalidomide for 21 days on, 7 days off along with weekly Daratumumab

## 2016-01-14 ENCOUNTER — Other Ambulatory Visit (HOSPITAL_BASED_OUTPATIENT_CLINIC_OR_DEPARTMENT_OTHER): Payer: Medicare Other

## 2016-01-14 ENCOUNTER — Ambulatory Visit: Payer: Medicare Other

## 2016-01-14 ENCOUNTER — Ambulatory Visit (HOSPITAL_BASED_OUTPATIENT_CLINIC_OR_DEPARTMENT_OTHER): Payer: Medicare Other

## 2016-01-14 VITALS — BP 111/68 | HR 84 | Temp 99.1°F | Resp 18

## 2016-01-14 DIAGNOSIS — Z5112 Encounter for antineoplastic immunotherapy: Secondary | ICD-10-CM | POA: Diagnosis present

## 2016-01-14 DIAGNOSIS — C9002 Multiple myeloma in relapse: Secondary | ICD-10-CM

## 2016-01-14 DIAGNOSIS — Z95828 Presence of other vascular implants and grafts: Secondary | ICD-10-CM

## 2016-01-14 LAB — COMPREHENSIVE METABOLIC PANEL
ALBUMIN: 3.1 g/dL — AB (ref 3.5–5.0)
ALK PHOS: 111 U/L (ref 40–150)
ALT: 16 U/L (ref 0–55)
ANION GAP: 8 meq/L (ref 3–11)
AST: 17 U/L (ref 5–34)
BILIRUBIN TOTAL: 0.49 mg/dL (ref 0.20–1.20)
BUN: 9 mg/dL (ref 7.0–26.0)
CALCIUM: 9 mg/dL (ref 8.4–10.4)
CO2: 24 mEq/L (ref 22–29)
CREATININE: 0.8 mg/dL (ref 0.6–1.1)
Chloride: 108 mEq/L (ref 98–109)
Glucose: 123 mg/dl (ref 70–140)
Potassium: 4 mEq/L (ref 3.5–5.1)
Sodium: 140 mEq/L (ref 136–145)
TOTAL PROTEIN: 5.6 g/dL — AB (ref 6.4–8.3)

## 2016-01-14 LAB — CBC WITH DIFFERENTIAL/PLATELET
BASO%: 0.9 % (ref 0.0–2.0)
BASOS ABS: 0 10*3/uL (ref 0.0–0.1)
EOS%: 11.3 % — ABNORMAL HIGH (ref 0.0–7.0)
Eosinophils Absolute: 0.4 10*3/uL (ref 0.0–0.5)
HEMATOCRIT: 37.1 % (ref 34.8–46.6)
HEMOGLOBIN: 11.9 g/dL (ref 11.6–15.9)
LYMPH%: 15.5 % (ref 14.0–49.7)
MCH: 30.2 pg (ref 25.1–34.0)
MCHC: 32 g/dL (ref 31.5–36.0)
MCV: 94.3 fL (ref 79.5–101.0)
MONO#: 0.9 10*3/uL (ref 0.1–0.9)
MONO%: 27.3 % — AB (ref 0.0–14.0)
NEUT%: 45 % (ref 38.4–76.8)
NEUTROS ABS: 1.5 10*3/uL (ref 1.5–6.5)
Platelets: 151 10*3/uL (ref 145–400)
RBC: 3.93 10*6/uL (ref 3.70–5.45)
RDW: 18.1 % — ABNORMAL HIGH (ref 11.2–14.5)
WBC: 3.3 10*3/uL — ABNORMAL LOW (ref 3.9–10.3)
lymph#: 0.5 10*3/uL — ABNORMAL LOW (ref 0.9–3.3)

## 2016-01-14 MED ORDER — PROCHLORPERAZINE MALEATE 10 MG PO TABS
ORAL_TABLET | ORAL | Status: AC
  Filled 2016-01-14: qty 1

## 2016-01-14 MED ORDER — ACETAMINOPHEN 325 MG PO TABS
650.0000 mg | ORAL_TABLET | Freq: Once | ORAL | Status: AC
  Administered 2016-01-14: 650 mg via ORAL

## 2016-01-14 MED ORDER — HEPARIN SOD (PORK) LOCK FLUSH 100 UNIT/ML IV SOLN
500.0000 [IU] | Freq: Once | INTRAVENOUS | Status: AC
  Administered 2016-01-14: 500 [IU] via INTRAVENOUS
  Filled 2016-01-14: qty 5

## 2016-01-14 MED ORDER — METHYLPREDNISOLONE SODIUM SUCC 125 MG IJ SOLR
125.0000 mg | Freq: Once | INTRAMUSCULAR | Status: AC
  Administered 2016-01-14: 125 mg via INTRAVENOUS

## 2016-01-14 MED ORDER — SODIUM CHLORIDE 0.9% FLUSH
10.0000 mL | INTRAVENOUS | Status: DC | PRN
  Administered 2016-01-14: 10 mL via INTRAVENOUS
  Filled 2016-01-14: qty 10

## 2016-01-14 MED ORDER — METHYLPREDNISOLONE SODIUM SUCC 125 MG IJ SOLR
INTRAMUSCULAR | Status: AC
  Filled 2016-01-14: qty 2

## 2016-01-14 MED ORDER — ACETAMINOPHEN 325 MG PO TABS
ORAL_TABLET | ORAL | Status: AC
  Filled 2016-01-14: qty 2

## 2016-01-14 MED ORDER — DIPHENHYDRAMINE HCL 25 MG PO CAPS
50.0000 mg | ORAL_CAPSULE | Freq: Once | ORAL | Status: AC
  Administered 2016-01-14: 50 mg via ORAL

## 2016-01-14 MED ORDER — DIPHENHYDRAMINE HCL 25 MG PO CAPS
ORAL_CAPSULE | ORAL | Status: AC
  Filled 2016-01-14: qty 2

## 2016-01-14 MED ORDER — SODIUM CHLORIDE 0.9 % IV SOLN
17.0000 mg/kg | Freq: Once | INTRAVENOUS | Status: AC
  Administered 2016-01-14: 1200 mg via INTRAVENOUS
  Filled 2016-01-14: qty 60

## 2016-01-14 MED ORDER — SODIUM CHLORIDE 0.9 % IV SOLN
Freq: Once | INTRAVENOUS | Status: AC
  Administered 2016-01-14: 09:00:00 via INTRAVENOUS

## 2016-01-14 MED ORDER — PROCHLORPERAZINE MALEATE 10 MG PO TABS
10.0000 mg | ORAL_TABLET | Freq: Once | ORAL | Status: AC
  Administered 2016-01-14: 10 mg via ORAL

## 2016-01-14 NOTE — Patient Instructions (Signed)

## 2016-01-14 NOTE — Patient Instructions (Signed)
[facility] Discharge Instructions for Patients Receiving Chemotherapy  Today you received the following chemotherapy agents Darzalex.  To help prevent nausea and vomiting after your treatment, we encourage you to take your nausea medication as directed.  If you develop nausea and vomiting that is not controlled by your nausea medication, call the clinic.   BELOW ARE SYMPTOMS THAT SHOULD BE REPORTED IMMEDIATELY:  *FEVER GREATER THAN 100.5 F  *CHILLS WITH OR WITHOUT FEVER  NAUSEA AND VOMITING THAT IS NOT CONTROLLED WITH YOUR NAUSEA MEDICATION  *UNUSUAL SHORTNESS OF BREATH  *UNUSUAL BRUISING OR BLEEDING  TENDERNESS IN MOUTH AND THROAT WITH OR WITHOUT PRESENCE OF ULCERS  *URINARY PROBLEMS  *BOWEL PROBLEMS  UNUSUAL RASH Items with * indicate a potential emergency and should be followed up as soon as possible.  Feel free to call the clinic you have any questions or concerns. The clinic phone number is [phone].  Please show the CHEMO ALERT CARD at check-in to the Emergency Department and triage nurse.    

## 2016-01-15 ENCOUNTER — Ambulatory Visit (INDEPENDENT_AMBULATORY_CARE_PROVIDER_SITE_OTHER): Payer: Medicare Other | Admitting: Internal Medicine

## 2016-01-15 ENCOUNTER — Encounter: Payer: Self-pay | Admitting: Internal Medicine

## 2016-01-15 VITALS — BP 128/92 | HR 96 | Ht 64.0 in | Wt 164.0 lb

## 2016-01-15 DIAGNOSIS — Z720 Tobacco use: Secondary | ICD-10-CM

## 2016-01-15 DIAGNOSIS — J449 Chronic obstructive pulmonary disease, unspecified: Secondary | ICD-10-CM

## 2016-01-15 DIAGNOSIS — F17210 Nicotine dependence, cigarettes, uncomplicated: Secondary | ICD-10-CM

## 2016-01-15 MED ORDER — BUDESONIDE-FORMOTEROL FUMARATE 160-4.5 MCG/ACT IN AERO: INHALATION_SPRAY | RESPIRATORY_TRACT | Status: DC

## 2016-01-15 NOTE — Patient Instructions (Addendum)
The key is to stop smoking completely before smoking completely stops you - ok to use the e cigs as a one way bridge off all tobacco products  Plan A = Automatic =  Symbicort 160 Take 2 puffs first thing in am and then another 2 puffs about 12 hours later.   Work on inhaler technique:  relax and gently blow all the way out then take a nice smooth deep breath back in, triggering the inhaler at same time you start breathing in.  Hold for up to 5 seconds if you can. Blow out thru nose. Rinse and gargle with water when done     Plan B = backup  Only use your atrovent hfa   - Ok to use up to 2 puffs  every 4 hours as needed   Please schedule a follow up office visit in 6 weeks, call sooner if needed with pfts on return

## 2016-01-15 NOTE — Progress Notes (Signed)
Subjective:    Patient ID: MARVIN CANON, female    DOB: [birth year]  MRN: [id number]     Brief patient profile:  38 yowf active smoker with MM s/p stem cell 2009 and 2013 with recurrence requiring chemo since 2013 but not working so RT to   lower ribs both sides Dec 2014  started new treatment of Jan 2015 and monthly until Jun 5th  p doe which started in April, worse  By end of May  ? Better since late May (? p d/c ACEi?)with last rx June 5th referred by Dr Sherren Mocha for eval of sob.   History of Present Illness  From chart: 10/10/2013 - 10/20/2013  Radiation Therapy  Started on palliative XRT for rib pain  11/27/2013  Carfilzomib d/c 04/06/14 last dose   05/16/2014 1st [facility] office visit/ [facility]  Chief Complaint  Patient presents with  . Pulmonary Consult    Referred per Dr. Sherren Mocha. Pt c/o SOB for since started on chemo in March 2015.  She states that she is SOB with or without exertion.  She gets out of breath with walking approx 100 ft.   whereas at one point could not do the aisle at [facility] and now can . No cough since off ACEi  Not using saba now but seemed to help cough and congestion when she was at her worst. Not sure symbicort helped No purulent or excessive mucus rec Please remember to go to the x-ray department downstairs for your tests - we will call you with the results when they are available. The key is to stop smoking completely before smoking completely stops you!  Please schedule a follow up office visit in 6 weeks, call sooner if needed - do not restart the lisinopril in meantime as there are plenty of other choices if your blood pressure rises again - see me or Dr Sherren Mocha if needed in meantime Late add : pfts scheduled for return    09/21/2014 acute ov/[facility] re: active smoker/ on ACEi/ refractory cough and wheeze  Chief Complaint  Patient presents with  . Acute Visit    Pt c/o SOB and cough for the past 10 days. Cough is prod with brown/blood tinged sputum.   rx  by ED x 7 days prior to OV  Zpak/ prednisone some better using both saba hfa and neb > 4 x daily since acute onset on symptoms 10 d prior to OV   rec I strongly recommend you stop lisinopril  Benicar 40 mg one daily x 2 weeks Augmentin 875 mg take one pill twice daily  X 10 days Mucinex dm 1200 mg every 12 hours as needed If still short or breath or cough use atrovent hfa 2 pffs every 4 hours as needed  If still coughing >  Try prilosec 20mg   Take 30-60 min before first meal of the day and Pepcid 20 mg one bedtime until cough is completely gone for at least a week without the need for cough suppression. Please schedule a follow up office visit in 2 weeks to see Tammy NP, sooner if needed and she will check your blood pressure and be sure you are better and see me for pfts>  Did not return as improved somewhat    01/15/2016  reconsult per Dr Yong Channel Melvyn Novas re: active smoker maint rx spiriva and prn atrovent due to "albuterol allergy" but note has used alb neb fine  Chief Complaint  Patient presents with  . Advice Only  Pt c/o continued cough with clear/white/brown mucus, wheeze and SOB. Pt states that she does feel she has improved some.   breathing worse since  Indolent onset 09/2015 gradually progressive to point x  across house / no early am or noct cough/congestion wheeze   No obvious day to day or daytime variabilty or assoc  cp or chest tightness,   overt sinus or hb symptoms. No unusual exp hx or h/o childhood pna/ asthma or knowledge of premature birth.  Sleeping ok without nocturnal  or early am exacerbation  of respiratory  c/o's or need for noct saba. Also denies any obvious fluctuation of symptoms with weather or environmental changes or other aggravating or alleviating factors except as outlined above   Current Medications, Allergies, Complete Past Medical History, Past Surgical History, Family History, and Social History were reviewed in [employer]  record.  ROS  The following are not active complaints unless bolded sore throat, dysphagia, dental problems, itching, sneezing,  nasal congestion or excess/ purulent secretions, ear ache,   fever, chills, sweats, unintended wt loss, pleuritic or exertional cp, hemoptysis,  orthopnea pnd or leg swelling, presyncope, palpitations, heartburn, abdominal pain, anorexia, nausea, vomiting, diarrhea  or change in bowel or urinary habits, change in stools or urine, dysuria,hematuria,  rash, arthralgias, visual complaints, headache, numbness weakness or ataxia or problems with walking or coordination,  change in mood/affect or memory.              Objective:   Physical Exam  amb wf nad  01/15/2016        164   09/21/14 123 lb (55.792 kg)  09/14/14 116 lb (52.617 kg)  08/30/14 119 lb 9.6 oz (54.25 kg)    Vital signs reviewed     HEENT: nl dentition, turbinates, and orophanx. Nl external ear canals without cough reflex   NECK :  without JVD/Nodes/TM/ nl carotid upstrokes bilaterally   LUNGS: no acc muscle use,   Mild insp and exp rhonchi bilaterally    CV:  RRR  no s3 or murmur or increase in P2, no edema   ABD:  soft and nontender with nl excursion in the supine position. No bruits or organomegaly, bowel sounds nl  MS:  warm without deformities, calf tenderness, cyanosis or clubbing  SKIN: warm and dry without lesions    NEURO:  alert, approp, no deficits  .          I personally reviewed images and agree with radiology impression as follows:  pCXR:  09/14/15 1. No evidence of acute cardiopulmonary disease. 2. COPD.  Assessment & Plan:

## 2016-01-16 ENCOUNTER — Telehealth: Payer: Self-pay | Admitting: Internal Medicine

## 2016-01-16 MED ORDER — FLUTICASONE-SALMETEROL 115-21 MCG/ACT IN AERO: 2.0000 | INHALATION_SPRAY | Freq: Two times a day (BID) | RESPIRATORY_TRACT | Status: DC

## 2016-01-16 NOTE — Telephone Encounter (Signed)
Received call from [place] at [facility].  He says that Symbicort is not approved because patient needs to try Advair and/or Breo.  Dr. Melvyn Novas, please advise what drug you would like to switch patient too.  Patient Instructions     The key is to stop smoking completely before smoking completely stops you - ok to use the e cigs as a one way bridge off all tobacco products  Plan A = Automatic = Symbicort 160 Take 2 puffs first thing in am and then another 2 puffs about 12 hours later.   Work on inhaler technique: relax and gently blow all the way out then take a nice smooth deep breath back in, triggering the inhaler at same time you start breathing in. Hold for up to 5 seconds if you can. Blow out thru nose. Rinse and gargle with water when done     Plan B = backup  Only use your atrovent hfa  - Ok to use up to 2 puffs every 4 hours as needed   Please schedule a follow up office visit in 6 weeks, call sooner if needed with pfts on return

## 2016-01-16 NOTE — Telephone Encounter (Signed)
Called and [place] from [place]. Informed him of MW's recs. He voiced understanding and had no further questions. Rx sent to the pharmacy. Nothing further needed.

## 2016-01-16 NOTE — Telephone Encounter (Signed)
advair 115 2bid 

## 2016-01-17 ENCOUNTER — Ambulatory Visit
Admission: RE | Admit: 2016-01-17 | Discharge: 2016-01-17 | Disposition: A | Discharge status: Home / Self Care | Payer: Medicare Other | Source: Ambulatory Visit | Attending: Neurology | Admitting: Neurology

## 2016-01-17 DIAGNOSIS — M50223 Other cervical disc displacement at C6-C7 level: Secondary | ICD-10-CM | POA: Diagnosis not present

## 2016-01-17 DIAGNOSIS — C9002 Multiple myeloma in relapse: Secondary | ICD-10-CM

## 2016-01-17 DIAGNOSIS — C9000 Multiple myeloma not having achieved remission: Secondary | ICD-10-CM

## 2016-01-17 DIAGNOSIS — M542 Cervicalgia: Secondary | ICD-10-CM

## 2016-01-17 DIAGNOSIS — M5481 Occipital neuralgia: Secondary | ICD-10-CM

## 2016-01-17 DIAGNOSIS — S129XXA Fracture of neck, unspecified, initial encounter: Secondary | ICD-10-CM

## 2016-01-17 DIAGNOSIS — M50222 Other cervical disc displacement at C5-C6 level: Secondary | ICD-10-CM | POA: Diagnosis not present

## 2016-01-17 DIAGNOSIS — M50221 Other cervical disc displacement at C4-C5 level: Secondary | ICD-10-CM | POA: Diagnosis not present

## 2016-01-17 MED ORDER — GADOBENATE DIMEGLUMINE 529 MG/ML IV SOLN
14.0000 mL | Freq: Once | INTRAVENOUS | Status: AC | PRN
  Administered 2016-01-17: 14 mL via INTRAVENOUS

## 2016-01-19 NOTE — Assessment & Plan Note (Signed)

## 2016-01-19 NOTE — Assessment & Plan Note (Addendum)
DDX of  difficult airways management almost all start with A and  include Adherence, Ace Inhibitors, Acid Reflux, Active Sinus Disease, Alpha 1 Antitripsin deficiency, Anxiety masquerading as Airways dz,  ABPA,  Allergy(esp in young), Aspiration (esp in elderly), Adverse effects of meds,  Active smokers, A bunch of PE's (a small clot burden can't cause this syndrome unless there is already severe underlying pulm or vascular dz with poor reserve) plus two Bs  = Bronchiectasis and Beta blocker use..and one C= CHF  Adherence is always the initial "prime suspect" and is a multilayered concern that requires a "trust but verify" approach in every patient - starting with knowing how to use medications, especially inhalers, correctly, keeping up with refills and understanding the fundamental difference between maintenance and prns vs those medications only taken for a very short course and then stopped and not refilled.  - The proper method of use, as well as anticipated side effects, of a metered-dose inhaler are discussed and demonstrated to the patient. Improved effectiveness after extensive coaching during this visit to a level of approximately 75 % from a baseline of 50 %  So rec challenge with symbicort 160 2bid pending f/u pfts   Active smoking greatest concern> (see separate a/p)   ? ACEi/losartan effects:  Better off acei apparently that's why didn't return; however, For reasons that may related to vascular permability and nitric oxide pathways but not elevated  bradykinin levels (as seen with  ACEi use) losartan in the generic form has been reported now from mulitple sources  to cause a similar pattern of non-specific  upper airway symptoms as seen with acei.   This has not been reported with exposure to the other ARB's to date, so need to consider change either generic diovan or avapro if ARB needed or use an alternative class altogether.  See:  Lelon Frohlich Allergy Asthma Immunol  2008: 101: p [phone]    ?  Anxiety > usually at the bottom of this list of usual suspects but should be much higher on this pt's based on H and P and note already on psychotropic(xanax and valium per med list) .   I had an extended discussion with the patient reviewing all relevant studies completed to date and  lasting 25  minutes of a 35/600 minute re-establish/consultation visit    Each maintenance medication was reviewed in detail including most importantly the difference between maintenance and prns and under what circumstances the prns are to be triggered using an action plan format that is not reflected in the computer generated alphabetically organized AVS.    Please see instructions for details which were reviewed in writing and the patient given a copy highlighting the part that I personally wrote and discussed at today's ov.

## 2016-01-21 ENCOUNTER — Ambulatory Visit: Payer: Medicare Other

## 2016-01-21 ENCOUNTER — Other Ambulatory Visit (HOSPITAL_BASED_OUTPATIENT_CLINIC_OR_DEPARTMENT_OTHER): Payer: Medicare Other

## 2016-01-21 VITALS — BP 140/77 | HR 100 | Temp 100.3°F | Resp 22

## 2016-01-21 DIAGNOSIS — C9002 Multiple myeloma in relapse: Secondary | ICD-10-CM | POA: Diagnosis present

## 2016-01-21 DIAGNOSIS — Z95828 Presence of other vascular implants and grafts: Secondary | ICD-10-CM

## 2016-01-21 DIAGNOSIS — C9000 Multiple myeloma not having achieved remission: Secondary | ICD-10-CM

## 2016-01-21 LAB — CBC WITH DIFFERENTIAL/PLATELET
BASO%: 0.5 % (ref 0.0–2.0)
BASOS ABS: 0 10*3/uL (ref 0.0–0.1)
EOS ABS: 0.4 10*3/uL (ref 0.0–0.5)
EOS%: 10.6 % — ABNORMAL HIGH (ref 0.0–7.0)
HEMATOCRIT: 36.3 % (ref 34.8–46.6)
HEMOGLOBIN: 11.3 g/dL — AB (ref 11.6–15.9)
LYMPH%: 12.7 % — AB (ref 14.0–49.7)
MCH: 30 pg (ref 25.1–34.0)
MCHC: 31.1 g/dL — ABNORMAL LOW (ref 31.5–36.0)
MCV: 96.3 fL (ref 79.5–101.0)
MONO#: 1.4 10*3/uL — AB (ref 0.1–0.9)
MONO%: 35.1 % — ABNORMAL HIGH (ref 0.0–14.0)
NEUT#: 1.6 10*3/uL (ref 1.5–6.5)
NEUT%: 41.1 % (ref 38.4–76.8)
NRBC: 1 % — AB (ref 0–0)
PLATELETS: 202 10*3/uL (ref 145–400)
RBC: 3.77 10*6/uL (ref 3.70–5.45)
RDW: 17.6 % — ABNORMAL HIGH (ref 11.2–14.5)
WBC: 3.9 10*3/uL (ref 3.9–10.3)
lymph#: 0.5 10*3/uL — ABNORMAL LOW (ref 0.9–3.3)

## 2016-01-21 LAB — COMPREHENSIVE METABOLIC PANEL
ALBUMIN: 2.9 g/dL — AB (ref 3.5–5.0)
ALK PHOS: 95 U/L (ref 40–150)
ALT: 14 U/L (ref 0–55)
ANION GAP: 9 meq/L (ref 3–11)
AST: 16 U/L (ref 5–34)
BILIRUBIN TOTAL: 0.6 mg/dL (ref 0.20–1.20)
BUN: 9.8 mg/dL (ref 7.0–26.0)
CALCIUM: 8.5 mg/dL (ref 8.4–10.4)
CO2: 25 mEq/L (ref 22–29)
Chloride: 107 mEq/L (ref 98–109)
Creatinine: 0.7 mg/dL (ref 0.6–1.1)
Glucose: 146 mg/dl — ABNORMAL HIGH (ref 70–140)
POTASSIUM: 3.7 meq/L (ref 3.5–5.1)
SODIUM: 140 meq/L (ref 136–145)
TOTAL PROTEIN: 5.5 g/dL — AB (ref 6.4–8.3)

## 2016-01-21 MED ORDER — SODIUM CHLORIDE 0.9 % IJ SOLN
10.0000 mL | Freq: Once | INTRAMUSCULAR | Status: AC
  Administered 2016-01-21: 10 mL via INTRAVENOUS
  Filled 2016-01-21: qty 10

## 2016-01-21 MED ORDER — SODIUM CHLORIDE 0.9% FLUSH
10.0000 mL | INTRAVENOUS | Status: DC | PRN
  Administered 2016-01-21: 10 mL via INTRAVENOUS
  Filled 2016-01-21: qty 10

## 2016-01-21 MED ORDER — HEPARIN SOD (PORK) LOCK FLUSH 100 UNIT/ML IV SOLN
500.0000 [IU] | Freq: Once | INTRAVENOUS | Status: AC
  Administered 2016-01-21: 500 [IU] via INTRAVENOUS
  Filled 2016-01-21: qty 5

## 2016-01-21 NOTE — Progress Notes (Signed)
Rinaldo Ratel, Dr. Calton Dach nurse at [phone]. Reported temperature of 100.3 and right upper gum pain with ulcer area.

## 2016-01-21 NOTE — Progress Notes (Signed)
Pt came to center for treatment. Pt's temp 100.3, pt stated teeth and gums were sore. Inflamation noted on rt upper and lower gum. Dr. Alvy Bimler contacted. MD stated to cancel chemo today and for pt to see a dental provider at once. Md stated that patient must see dental provider before treatment is to be given again. Pt verbalized understanding.

## 2016-01-21 NOTE — Patient Instructions (Signed)

## 2016-01-22 ENCOUNTER — Telehealth: Payer: Self-pay | Admitting: *Deleted

## 2016-01-22 ENCOUNTER — Other Ambulatory Visit: Payer: Self-pay | Admitting: *Deleted

## 2016-01-22 DIAGNOSIS — M47812 Spondylosis without myelopathy or radiculopathy, cervical region: Secondary | ICD-10-CM | POA: Diagnosis not present

## 2016-01-22 DIAGNOSIS — M5481 Occipital neuralgia: Secondary | ICD-10-CM | POA: Diagnosis not present

## 2016-01-22 MED ORDER — POMALIDOMIDE 3 MG PO CAPS: 3.0000 mg | ORAL_CAPSULE | Freq: Every day | ORAL | Status: DC

## 2016-01-22 NOTE — Telephone Encounter (Signed)
"   I need to talk with (collaborative) nurse.  I wasn't treated yesterday due to fever and need to see a dentist."  Call transferred to ext [phone].

## 2016-01-23 ENCOUNTER — Telehealth: Payer: Self-pay | Admitting: *Deleted

## 2016-01-23 NOTE — Telephone Encounter (Signed)
Called pt. Relayed results per Dr Jaynee Eagles note. Pt verbalized understanding. She stated she went to pain management doctor yesterday and they gave her an injection in her neck to try and help. She stated this did not do anything. Made f/u for 3/30 at 130pm, check in 115pm.

## 2016-01-23 NOTE — Telephone Encounter (Signed)
-----   Message from Melvenia Beam, MD sent at 01/22/2016  5:45 PM EDT ----- Brain is normal for age. There were no new compression fractures on the MRI of her neck. 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 If she is still having this pain then have her follow up with me in the office so we can discuss treatment which may include medication or nerve blocks. thanks

## 2016-01-24 ENCOUNTER — Telehealth: Payer: Self-pay | Admitting: *Deleted

## 2016-01-24 ENCOUNTER — Encounter: Payer: Self-pay | Admitting: Hematology and Oncology

## 2016-01-24 NOTE — Telephone Encounter (Signed)
This RN spoke with pt per call to [place] wanting to " know if Dr Alvy Bimler will want to treat me on Tuesday "  Per discussion, pt states she had to hold chemo this past week due to fever from dental concerns.  She was seen by her dentist who has referred her to " an oral surgeon and my appointment is on 02/10/2016"   Pt states her temp has been normal since seeing her dentist.  Kendra Johns is scheduled for treatment on 01/28/2016 with a lab appointment without a provider visit.- next provider visit is scheduled on 02/04/2016.  Return call number given per this call as [phone].  Per call this RN informed pt MD is not in the office today- her concern will be sent to MD and RN but not to expect a return call until Monday. Kendra Johns verbalized understanding.

## 2016-01-24 NOTE — Progress Notes (Signed)
Per [facility] pomaylst was shipped via [employer] 01/23/16

## 2016-01-27 ENCOUNTER — Telehealth: Payer: Self-pay | Admitting: *Deleted

## 2016-01-27 NOTE — Telephone Encounter (Signed)
I have cancelled her Rx on 3/28. We will keep everything else on 4/4 She can continue on Pomalyst for now Please let her know her appt on 3/28 is cancelled

## 2016-01-27 NOTE — Telephone Encounter (Signed)
Informed pt of Appt/tx canceled tomorrow 3/28 d/t pt reported dental problems and fevers last week.  Keep next appt on Tues 4/4 as scheduled.  Pt states understanding.  Reports she doesn't have any more fevers this weekend and thinks dental "infection is going down."  Pt states neck pain severe and "really killing me."   She has appt to see Neuro this Thursday.  Instructed pt to keep this appt as scheduled.   We will see her on 4/4 but instructed her to call if any changes/ concerns before then.  She verbalized understanding.

## 2016-01-27 NOTE — Telephone Encounter (Signed)
Addendum to prior phone note:   I also instructed pt to continue taking Pomalyst as prescribed by Dr. Alvy Bimler.  She verbalized understanding.

## 2016-01-28 ENCOUNTER — Ambulatory Visit: Payer: Self-pay

## 2016-01-28 ENCOUNTER — Other Ambulatory Visit: Payer: Self-pay

## 2016-01-28 DIAGNOSIS — G894 Chronic pain syndrome: Secondary | ICD-10-CM | POA: Diagnosis not present

## 2016-01-28 DIAGNOSIS — M47812 Spondylosis without myelopathy or radiculopathy, cervical region: Secondary | ICD-10-CM | POA: Diagnosis not present

## 2016-01-28 DIAGNOSIS — M5481 Occipital neuralgia: Secondary | ICD-10-CM | POA: Diagnosis not present

## 2016-01-28 DIAGNOSIS — Z79891 Long term (current) use of opiate analgesic: Secondary | ICD-10-CM | POA: Diagnosis not present

## 2016-01-30 ENCOUNTER — Telehealth: Payer: Self-pay | Admitting: Hematology and Oncology

## 2016-01-30 ENCOUNTER — Other Ambulatory Visit: Payer: Self-pay | Admitting: *Deleted

## 2016-01-30 ENCOUNTER — Encounter: Payer: Self-pay | Admitting: Neurology

## 2016-01-30 ENCOUNTER — Telehealth: Payer: Self-pay | Admitting: *Deleted

## 2016-01-30 ENCOUNTER — Ambulatory Visit (INDEPENDENT_AMBULATORY_CARE_PROVIDER_SITE_OTHER): Payer: Medicare Other | Admitting: Neurology

## 2016-01-30 VITALS — BP 121/80 | HR 97 | Wt 160.8 lb

## 2016-01-30 DIAGNOSIS — M5481 Occipital neuralgia: Secondary | ICD-10-CM

## 2016-01-30 MED ORDER — GABAPENTIN 300 MG PO CAPS: 600.0000 mg | ORAL_CAPSULE | Freq: Three times a day (TID) | ORAL | Status: DC

## 2016-01-30 NOTE — Telephone Encounter (Signed)
Left vm to inform patient of appt 4/4 change to 4/19  Date/time per 3/30 pof

## 2016-01-30 NOTE — Patient Instructions (Signed)
Remember to drink plenty of fluid, eat healthy meals and do not skip any meals. Try to eat protein with a every meal and eat a healthy snack such as fruit or nuts in between meals. Try to keep a regular sleep-wake schedule and try to exercise daily, particularly in the form of walking, 20-30 minutes a day, if you can.   As far as your medications are concerned, I would like to suggest: increase gabapentin to 600mg  three times a day  Follow up with [facility] phone number is [phone]. We also have an after hours call service for urgent matters and there is a physician on-call for urgent questions. For any emergencies you know to call 911 or go to the nearest emergency room

## 2016-01-30 NOTE — Telephone Encounter (Signed)
Pt states she has another dental appt on 02/10/16. Dentist wants to pull all of her bottom teeth. Wants to know if Dr Alvy Bimler wants her to come for chemo as scheduled on Tuesday, 02/04/16.

## 2016-01-30 NOTE — Telephone Encounter (Signed)
No. Please cancel all appt on 4/4 and move everything to 4/19. I will see her at 930 am that day, please put a new POF

## 2016-01-30 NOTE — Progress Notes (Signed)
Kendra Johns    Provider:  Dr Jaynee Eagles Referring Provider: Marin Olp, MD Primary Care Physician:  Kendra Reddish, MD  [facility]   CC: Headaches in the right back of the head  Interval history: Patient returns for follow up on right occipital neuralgia. Imaging of the brain and cervical spine did not find any etiology for the pain or nerve entrapment. Most cases are idiopathic. She has had multiple occipital nerve blocks with only 3 weeks of relief. She sees Dr. Hardin Negus. She probably needs to follow up with Dr. Hardin Negus for the pain and possible c3 blocks. She has pain especially when she lays on her head. She needs to follow up. The gabapentin helps a little. We can increase the gabapentin. Otjer options are Tegretol and TCAs. She needs to go see Dr. Hardin Negus for more procedures possibly at C3. She is really still suffering.  Discussed management, showed her pictures of the occipital nerve as it exits the spine and travel up to the head.  MRi brain 01/2016: This MRI of the brain with and without contrast shows the following: 1. Mild extent of T2/FLAIR hyperintense foci in the subcortical white matter consistent with minimal age-appropriate chronic microvascular ischemic change.  2. Mild chronic inflammatory changes in the right maxillary sinus.  3. There are no acute findings.  MRI cervical spine 01/2016: : : On sagittal images, the spine is imaged from above the cervicomedullary junction to T3. The spinal cord is of normal caliber and signal. There is a small inferior endplate compression fracture of C4, also present on the prior MRI. There is no edema or enhancement... There is a severe 80-90% compression fracture of T1, unchanged when compared to the prior MRI.. Signal is bright on T2-weighted images and hypointense on precontrast T1-weighted images. There is mild enhancement of the marrow at this level, slightly improved when compared  to the 08/01/2015 MRI.Marland Kitchen There is mild heterogeneity of the T2 and T3 vertebral body bone marrow with minimal patchy enhancement, essentially unchanged when compared to the prior MRI. The discs and interspaces were further evaluated on axial views from C2 to T1 as follows: C2 - C3: The disc and interspace appear normal. C3 - C4: There is mild disc bulging and minimal uncovertebral spurring. The neural foramina are not significantly narrowed and there is no nerve root impingement.. C4 - C5: There is right greater than left uncovertebral spurring and mild disc bulging. There is moderately severe right foraminal narrowing at could lead to right C5 nerve root compression. C5 - C6: There is minimal central disc bulging and facet hypertrophy atrophy. The neural foramina are mildly narrowed but there does not appear to be any nerve root impingement. C6 - C7: There is mild central disc bulging. The neural foramina are mildly narrowed but there does not appear to be any nerve root impingement. C7 - T1: The disc appears normal neural foramina are widely patent.. T1 - T2: The disc appears normal in the neural foramina are widely patent. There is no protrusion of the fractured T1 vertebral body into the central canal.  After the infusion of contrast material, the T1 vertebral body enhances and there is also patchy enhancement in the T2 and T3 vertebral bodies. This is essentially unchanged when compared to the prior study.  IMPRESSION: This MRI of the cervical spine with and without contrast shows the following: 1. At C4-C5 there is moderately severe right foraminal narrowing due to a right disc osteophyte complex. There could  be right C5 nerve root compression. MRI from 08/01/2015 does not include axial images so change over time cannot be determined. 2. There is a severe compression fracture of the T1 vertebral body that was also present on the prior MRI. There continues to be some enhancement of the  bone marrow at this level. There is a milder inferior endplate chronic compression of the C4 vertebral body with normal marrow signal. This was also observed on prior MRI. 3. Heterogenous marrow with patchy enhancement within the T2 and T3 vertebral bodies, related to either the patient's known multiple myeloma or radiation. The appearance is similar to what was observed 08/01/2015 on MRI. 4. The spinal cord appears normal.  HPI: Kendra Johns is a 65 y.o. female here as a referral from Dr. Yong Channel for headaches. She has a PMHx of multiple myeloma kappa light chain disease, COPD, HTN. She had Palliative radiation therapy to C7-T1 completed. August.She had a headache that started 3 months ago with tingling, pain and sharpness on the right side of the head(points to the occipital area). Nothing happened before the tingling started, no inciting events or trauma. This past Tuesday morning about 6-7 minutes after waking she has a severe pain on the right side of the neck near the spine. Deep. No inciting event, she was just sitting there. She has gotten better since taking the valium. She can still feel the pain it when she presses down on her spine. It is In the muscle and possibly the bone. (she point to the right c3/c4 area). The tingling and the pain is continuous (in the occipital right area and She points to the emergence of the lesser occipital nerve at the base of the skull). The pain is tingling,shooting, electric pain.Tender to the touch on the right side. Continuous and severe. No bowel or bladder changes, no arm weakness or paresthesias, no focal weakness, no vision changes, no aphasia or dysarthria or dysphasia. No inciting events and no recent illnesses or trauma.  Reviewed notes, labs and imaging from outside physicians, which showed:  CT HEAD WITHOUT AND WITH CONTRAST  TECHNIQUE: Contiguous axial images were obtained from the base of the skull through the vertex without and with  intravenous contrast  CONTRAST: 8m OMNIPAQUE IOHEXOL 300 MG/ML SOLN  COMPARISON: None.  FINDINGS: A punctate focus of increased density in the region of the left sylvian fissure/operculum on a single image of the noncontrast CT (series 2, image 16) does not persist on the postcontrast CT and is felt to be artifactual rather than reflecting a single tiny focus of hemorrhage. There is no evidence of acute cortical infarct, intracranial hemorrhage, mass, midline shift, or extra-axial fluid collection. Ventricles and sulci are within normal limits for age. No abnormal enhancement is identified.  Orbits are unremarkable. The visualized paranasal sinuses and mastoid air cells are clear. A few subcentimeter skull lesions are nonspecific but may reflect involvement by patient's underlying multiple myeloma. Mild calcified atherosclerosis is noted at the skullbase.  IMPRESSION: No evidence of acute intracranial abnormality.  MRI 07/2015: FINDINGS: Cervical Findings:  Grossly negative visualized brain parenchyma.  Skullbase through C3 levels appear stable and without tumor. Stable C4 compression fracture, no marrow edema or enhancement. C5 and C6 levels appear stable and without tumor. C7 level described in the thoracic section below.  No malignant cervical spinal stenosis. No cervical spinal cord signal abnormality. No cervical spine intradural enhancement. Visible paraspinal soft tissues appear stable without tumor.  CBC and CMP unremarkable, EGFR 88  Review of Systems: Patient complains of symptoms per HPI as well as the following symptoms: no CP, no SOB. Pertinent negatives per HPI. All others negative.    Social History   Social History  . Marital Status: Married    Spouse Name: Shanise Balch.  . Number of Children: 2  . Years of Education: 12   Occupational History  . Security    Social History Main Topics  . Smoking status: Current Every Day Smoker -- 0.25  packs/day for 44 years    Types: Cigarettes  . Smokeless tobacco: Never Used  . Alcohol Use: No  . Drug Use: No  . Sexual Activity: No   Other Topics Concern  . Not on file   Social History Narrative   Lives with husband and grandson.     Ambulates with a walker at baseline.   Caffeine use: Coffee: 1-2 cup/day    Family History  Problem Relation Age of Onset  . Alcohol abuse    . Lung cancer Father     smoked  . Leukemia Mother   . Cirrhosis Sister   . Colon cancer Neg Hx   . Neuropathy Neg Hx   . Lung cancer Sister     smoked    Past Medical History  Diagnosis Date  . Depression   . Hypertension   . Hepatitis C     genotype 1  . Diverticulosis of colon   . Multiple myeloma     kappa light chain, s/p high-dose chemo/stem cell tx - [facility]  . Hx of adenomatous colonic polyps 2007  . Anxiety   . Osteoarthritis   . Sleep apnea   . Hyperlipidemia   . GERD with stricture   . External hemorrhoids   . Hepatitis B virus infection   . IBS (irritable bowel syndrome)   . Helicobacter pylori gastritis 2001    ? if treated  . Sciatic nerve pain     with Dr. Earle Gell  . Chronic low back pain   . Unspecified vitamin D deficiency 09/22/2013  . Allergy   . Fever 11/28/2013  . Dehydration 11/28/2013  . Nausea alone 01/22/2014  . Cough 08/02/2014  . DIVERTICULOSIS, COLON 08/31/2006  . DEPRESSION 08/31/2006    Qualifier: Diagnosis of  By: [name], [name]    . Muscle cramp 11/21/2014  . Chronic fatigue 03/07/2015  . Vitamin D deficiency 05/28/2015  . S/P radiation therapy 06/05/15-06/18/15    C7-T1   . Dysuria 10/08/2015  . Acute intractable headache 11/14/2015    Past Surgical History  Procedure Laterality Date  . Abdominal hysterectomy    . Oophorectomy    . Dilation and curettage of uterus    . Bone marrow transplant      2009, 2013 [facility]  . Appendectomy    . Colonoscopy w/ polypectomy  08/2006    1-2 adenomas, 6 polyps total, diverticulosis and external hemorrhoids    . Upper gastrointestinal endoscopy  08/2003    esophageal stricture dilation, hiatal hernia, gastrritis  . Cholecystectomy    . Tonsillectomy and adenoidectomy    . Femur im nail Right 08/22/2015    Procedure: INTRAMEDULLARY (IM) NAIL FEMORAL;  Surgeon: [name], MD;  Location: [facility];  Service: Orthopedics;  Laterality: Right;    Current Outpatient Prescriptions  Medication Sig Dispense Refill  . acyclovir (ZOVIRAX) 400 MG tablet Take 1 tablet (400 mg total) by mouth daily. 30 tablet 6  . ALPRAZolam (XANAX) 0.25 MG tablet Take  1 tablet (0.25 mg total) by mouth at bedtime as needed for anxiety. 60 tablet 0  . aspirin 81 MG tablet Take 81 mg by mouth daily.    . budesonide-formoterol (SYMBICORT) 160-4.5 MCG/ACT inhaler Take 2 puffs first thing in am and then another 2 puffs about 12 hours later. 1 Inhaler 11  . Cholecalciferol (VITAMIN D3) 5000 UNITS CAPS Take 5,000 Units by mouth daily.     . cyclobenzaprine (FLEXERIL) 10 MG tablet Take 1 tablet (10 mg total) by mouth 3 (three) times daily as needed for muscle spasms. 15 tablet 0  . diazepam (VALIUM) 10 MG tablet Take 1 tablet (10 mg total) by mouth every 6 (six) hours as needed (muscle spasm). 20 tablet 0  . fluticasone-salmeterol (ADVAIR HFA) 115-21 MCG/ACT inhaler Inhale 2 puffs into the lungs 2 (two) times daily. 1 Inhaler 5  . gabapentin (NEURONTIN) 300 MG capsule Take 1 capsule (300 mg total) by mouth 3 (three) times daily. 90 capsule 11  . ibuprofen (ADVIL,MOTRIN) 600 MG tablet Take 1 tablet (600 mg total) by mouth every 8 (eight) hours as needed. 20 tablet 0  . ipratropium (ATROVENT HFA) 17 MCG/ACT inhaler Inhale 2 puffs into the lungs every 4 (four) hours as needed for wheezing. 1 Inhaler 12  . levothyroxine (SYNTHROID, LEVOTHROID) 50 MCG tablet TAKE 1 TABLET (50 MCG TOTAL) BY MOUTH DAILY BEFORE BREAKFAST. 90 tablet 1  . lidocaine-prilocaine (EMLA) cream Apply 1 application topically as needed. 30 g 6  . losartan (COZAAR) 50  MG tablet Take 1 tablet (50 mg total) by mouth daily. 90 tablet 1  . magic mouthwash SOLN Take 10 mLs by mouth 4 (four) times daily as needed for mouth pain. Swish and spit  Or  Swish and swallow. 480 mL 2  . morphine (MS CONTIN) 100 MG 12 hr tablet Take 90 mg by mouth every 8 (eight) hours.     Marland Kitchen morphine (MSIR) 30 MG tablet Take 1-1.5 tablets (30-45 mg total) by mouth every 2 (two) hours as needed for moderate pain or severe pain. (Patient taking differently: Take 30-45 mg by mouth every 2 (two) hours as needed for moderate pain or severe pain. Take one tablet every 2 hours as needed for moderate pain) 30 tablet 0  . nicotine (NICODERM CQ - DOSED IN MG/24 HOURS) 14 mg/24hr patch Place 1 patch (14 mg total) onto the skin daily. 28 patch 0  . pomalidomide (POMALYST) 3 MG capsule Take 1 capsule (3 mg total) by mouth daily. Take with water on days 1-21. Repeat every 28 days. 21 capsule 0  . senna-docusate (SENOKOT-S) 8.6-50 MG tablet Take 1 tablet by mouth daily.    Marland Kitchen SPIRIVA HANDIHALER 18 MCG inhalation capsule Take 2 puffs by mouth 4 (four) times daily.    . [DISCONTINUED] amLODipine (NORVASC) 5 MG tablet Take 1 tablet (5 mg total) by mouth daily. 90 tablet 3   No current facility-administered medications for this visit.    Allergies as of 01/30/2016 - Review Complete 01/30/2016  Allergen Reaction Noted  . Codeine Itching 03/06/2011  . Ibuprofen Other (See Comments) 08/14/2005  . Albuterol Hives 09/13/2014    Vitals: BP 121/80 mmHg  Pulse 97  Wt 160 lb 12.8 oz (72.938 kg) Last Weight:  Wt Readings from Last 1 Encounters:  01/30/16 160 lb 12.8 oz (72.938 kg)   Last Height:   Ht Readings from Last 1 Encounters:  01/15/16 5' 4" (1.626 m)     Physical exam: Exam: Gen: NAD,  conversant  CV: RRR, no MRG. No Carotid Bruits. +peripheral edema, warm, nontender Eyes: Conjunctivae clear without exudates or hemorrhage  Neuro: Detailed Neurologic Exam  Speech:   Speech is normal; fluent and spontaneous with normal comprehension.  Cognition:  The patient is oriented to person, place, and time;   recent and remote memory intact;   language fluent;   normal attention, concentration,   fund of knowledge Cranial Nerves:  The pupils are equal, round, and reactive to light. Attempted fndoscopic exam couldn't visualize. Visual fields are full to finger confrontation. Extraocular movements are intact. Trigeminal sensation is intact and the muscles of mastication are normal. The face is symmetric. The palate elevates in the midline. Hearing intact. Voice is normal. Shoulder shrug is normal. The tongue has normal motion without fasciculations.   Coordination:  Normal finger to nose and heel to shin.   Gait:  With walker, good stride  Motor Observation:  No asymmetry, no atrophy, and no involuntary movements noted. Tone:  Normal muscle tone.   Posture:  Posture is normal. normal erect   Strength: Bilateral right > left hip flexion weakness otherwise strength is V/V in the upper and lower limbs.    Sensation: intact to LT   Reflex Exam:  DTR's: Brisk in the uppers. 1+ left patellars and trace right patellar and absent AJs.   Toes:  The toes are downgoing bilaterally.  Clonus:  Clonus is absent.     Assessment/Plan: New-onset right-sided occipital neuralgia and an neck pain in a 65 year old female with multiple myeloma and previous cervical compression fractures. Pain is unilateral right and is located in the distribution of the greater, lesser, third occipital and auricular nerves painful, sharp, with tenderness and trigger points at the emergence of the lesser occipital nerve and upper cervical spine. Given her multiple myeloma and previous history of cervical compression fracture and radiation to the cervical spine, need to rule out new cervical pathology causing impingement of the occipital nerve  at C2-C3 as well as any new lesion at the skull base causing entrapment. Other causes of occipital neuralgia include pain in the upper cervical joints or disks, suboccipital or upper posterior neck muscles including the traps/scm, spinal and posterior cranial fossa dura mater, vertebral arteries, structural and infiltrative lesions such as meningioma, schwannoma, myelitis, compressive disk disease and others. Will need MRI of the brain and cervical spine. Neurontin prn for the pain. Common side effects include dizziness, drowsiness, weakness, tired feeling, nausea, diarrhea, constipation, blurred vision, headache, breast swelling, dry mouth, loss of balance or coordination. Stop for anything concerning.  MRiof the brain and cervical spine w/o etiology for right-sided occipital nerualgia were unrevealing for cause Performed a nerve block today, she was in pain Follow up with Dr. Hardin Negus Can increase Gabapentin but other medications also include tegretol and TCA, follow up for other pain procedures with Dr. Hardin Negus   NERVE BLOCK PROCEDURE NOTE for occipital neuralgia and migraine  Procedure: Patient was consented for right occipital nerve blocks. A solution containing 0.5% 49m/ml Bupivacaine 1.5-cc and 2% lidocaine 1.5ccwas prepared in 3-CC syringes with 30 gauge 1/2 inch needle.   3 Target areas in the occipital, suboccipital regions were identified via palpation and pain response.The sites junctions were sterilized with alcohol wipes. The contents of syringe was injected in a fanlike fashion. The headache improved from 6/10 to 0/10. Patient tolerated the procedure well and no complications were noted.    Consent was provided below and patient acknowledged understanding:   Lidocaine 2%  Lot: [id number]  Expiration 09/2019  NDC: 35686-168-37   Marcaine 0.5%  Lot: [id number]  Expiration: 06/02/2017  NDC: 2902-1115-52    What to expect afterwards?  Immediately after the injection, the  back of your head may feel warm and numb. You may also experience reduction in the pain. The local anaesthetic wears off in a few hours and the steroid usually takes  3-7 days to take effect.   The pain relief is vary variable and can last from a few days to several months. Some patients do not experience any pain relief. Hence it is difficult to predict the outcome of the injection treatment in a particular patient.   There may be some discomfort at the injection site for a couple of days after treatment, however, this should settle quite quickly. We advise you to take things easy for the rest of the day. Continue taking your pain medication as advised by your consultant or until you feel benefit from the treatment.   What are the side effects / complications?  Common   Soreness / bruising at the injection site.   Temporary increase (up to 7 days) in pain following procedure.   Rare   Bleeding   Infection at the injection site   Allergic reaction   New pain   Worsening pain   CC: Dr. Army Fossa, MD  [facility] [address] [place], [place] [address]  Phone [phone] Fax [phone] A total of 30 minutes was spent face-to-face with this patient. Over half this time was spent on counseling patient on the occipital neuralgia  diagnosis and different diagnostic and therapeutic options available.

## 2016-02-04 ENCOUNTER — Ambulatory Visit: Payer: Self-pay | Admitting: Hematology and Oncology

## 2016-02-04 ENCOUNTER — Other Ambulatory Visit: Payer: Self-pay

## 2016-02-04 ENCOUNTER — Ambulatory Visit: Payer: Self-pay

## 2016-02-05 ENCOUNTER — Telehealth: Payer: Self-pay | Admitting: *Deleted

## 2016-02-05 NOTE — Telephone Encounter (Signed)
Pt states she was scheduled to have teeth pulled on 4/10 but now the Oral Surgeon has called to tell her they have to reschedule her to another clinic in [place].  They will call her w/ new date and time.  Pt is concerned that she is missing her Treatment here while waiting for appointment to have teeth extracted.   Explained to pt that it is necessary to have her teeth taken care of to avoid problems in the future.  Instructed her to let us know as soon as she finds out her new appointment date and we will schedule her back here accordingly.  If her surgeon does not pull her teeth soon, then Dr. Alvy Bimler may be able to schedule her back for treatment again before her surgery.  It just depends on when they can do it.  Call us as soon as she knows the new date.  Pt verbalized understanding.

## 2016-02-05 NOTE — Telephone Encounter (Signed)
So for now keep appt on 4/19?

## 2016-02-05 NOTE — Telephone Encounter (Signed)
Yes until we find out her new appt date

## 2016-02-05 NOTE — Telephone Encounter (Signed)
Call received from patient requesting collaborative.  Offer to help declined.  Call transfered to ext [phone] per patient's request at [phone].

## 2016-02-07 ENCOUNTER — Other Ambulatory Visit: Payer: Self-pay | Admitting: Hematology and Oncology

## 2016-02-13 ENCOUNTER — Telehealth: Payer: Self-pay | Admitting: *Deleted

## 2016-02-13 ENCOUNTER — Telehealth: Payer: Self-pay | Admitting: Neurology

## 2016-02-13 MED ORDER — GABAPENTIN 600 MG PO TABS: 600.0000 mg | ORAL_TABLET | Freq: Three times a day (TID) | ORAL | Status: AC

## 2016-02-13 MED ORDER — GABAPENTIN 600 MG PO TABS: 600.0000 mg | ORAL_TABLET | Freq: Three times a day (TID) | ORAL | Status: DC

## 2016-02-13 NOTE — Telephone Encounter (Signed)
Patient called due to missing treatment while waiting for dental extraction. Patient was referred to oral surgeon in [facility]  [phone] who said he would not be able to do it and would send her to [facility]. She was told that someone would call her with an appt but no one has. This was about a week ago. Wants to know if we can find out something so that she can resume treatment.

## 2016-02-13 NOTE — Telephone Encounter (Addendum)
I called patient. The patient has gone from 300 mg 3 times daily of gabapentin to a 600  milligram 3 times a day dose. I will call in a prescription to cover this.

## 2016-02-13 NOTE — Telephone Encounter (Signed)
Pt was told to increase gabapentin (NEURONTIN) 300 MG capsule her last office visit. She did and has now run out of medication, the pharmacy will not refill due to being to early. Can a new rx be wrote to show the increase in dosage? Please send to [facility] on [address].

## 2016-02-13 NOTE — Telephone Encounter (Signed)
Pt reports she has not heard from Oral Surgeon at [facility] yet and meanwhile she is anxious because she her treatment had been on hold pending oral surgery.  Instructed pt to call her Oral surgeon that referred her to [facility] in the first place and ask them to f/u on her referral.    Pt is scheduled here next week on 4/19 for Lab/MD/Daratumumab.  Instructed pt to keep appts as scheduled unless she hears otherwise.  Pt verbalized understanding.

## 2016-02-18 ENCOUNTER — Other Ambulatory Visit: Payer: Self-pay | Admitting: *Deleted

## 2016-02-18 MED ORDER — POMALIDOMIDE 3 MG PO CAPS: 3.0000 mg | ORAL_CAPSULE | Freq: Every day | ORAL | Status: DC

## 2016-02-19 ENCOUNTER — Ambulatory Visit: Payer: Medicare Other

## 2016-02-19 ENCOUNTER — Other Ambulatory Visit: Payer: Self-pay | Admitting: *Deleted

## 2016-02-19 ENCOUNTER — Other Ambulatory Visit (HOSPITAL_BASED_OUTPATIENT_CLINIC_OR_DEPARTMENT_OTHER): Payer: Medicare Other

## 2016-02-19 ENCOUNTER — Telehealth: Payer: Self-pay | Admitting: Hematology and Oncology

## 2016-02-19 ENCOUNTER — Ambulatory Visit (HOSPITAL_BASED_OUTPATIENT_CLINIC_OR_DEPARTMENT_OTHER): Payer: Medicare Other | Admitting: Hematology and Oncology

## 2016-02-19 ENCOUNTER — Encounter: Payer: Self-pay | Admitting: Hematology and Oncology

## 2016-02-19 VITALS — BP 156/83 | HR 78 | Temp 98.6°F | Resp 18 | Ht 64.0 in | Wt 161.4 lb

## 2016-02-19 DIAGNOSIS — C9002 Multiple myeloma in relapse: Secondary | ICD-10-CM

## 2016-02-19 DIAGNOSIS — I10 Essential (primary) hypertension: Secondary | ICD-10-CM | POA: Diagnosis not present

## 2016-02-19 DIAGNOSIS — D702 Other drug-induced agranulocytosis: Secondary | ICD-10-CM

## 2016-02-19 DIAGNOSIS — L89609 Pressure ulcer of unspecified heel, unspecified stage: Secondary | ICD-10-CM | POA: Insufficient documentation

## 2016-02-19 DIAGNOSIS — Z720 Tobacco use: Secondary | ICD-10-CM

## 2016-02-19 DIAGNOSIS — L89622 Pressure ulcer of left heel, stage 2: Secondary | ICD-10-CM

## 2016-02-19 DIAGNOSIS — K089 Disorder of teeth and supporting structures, unspecified: Secondary | ICD-10-CM

## 2016-02-19 DIAGNOSIS — F17210 Nicotine dependence, cigarettes, uncomplicated: Secondary | ICD-10-CM

## 2016-02-19 DIAGNOSIS — K122 Cellulitis and abscess of mouth: Secondary | ICD-10-CM

## 2016-02-19 HISTORY — DX: Cellulitis and abscess of mouth: K12.2

## 2016-02-19 LAB — CBC WITH DIFFERENTIAL/PLATELET
BASO%: 1.5 % (ref 0.0–2.0)
Basophils Absolute: 0.1 10*3/uL (ref 0.0–0.1)
EOS%: 13.8 % — AB (ref 0.0–7.0)
Eosinophils Absolute: 0.5 10*3/uL (ref 0.0–0.5)
HEMATOCRIT: 37.2 % (ref 34.8–46.6)
HEMOGLOBIN: 11.6 g/dL (ref 11.6–15.9)
LYMPH#: 1 10*3/uL (ref 0.9–3.3)
LYMPH%: 28.2 % (ref 14.0–49.7)
MCH: 29.2 pg (ref 25.1–34.0)
MCHC: 31.2 g/dL — AB (ref 31.5–36.0)
MCV: 93.7 fL (ref 79.5–101.0)
MONO#: 0.8 10*3/uL (ref 0.1–0.9)
MONO%: 23.5 % — ABNORMAL HIGH (ref 0.0–14.0)
NEUT#: 1.1 10*3/uL — ABNORMAL LOW (ref 1.5–6.5)
NEUT%: 33 % — ABNORMAL LOW (ref 38.4–76.8)
Platelets: 263 10*3/uL (ref 145–400)
RBC: 3.97 10*6/uL (ref 3.70–5.45)
RDW: 17.2 % — AB (ref 11.2–14.5)
WBC: 3.4 10*3/uL — ABNORMAL LOW (ref 3.9–10.3)

## 2016-02-19 LAB — COMPREHENSIVE METABOLIC PANEL
ALK PHOS: 92 U/L (ref 40–150)
ALT: <9 U/L (ref 0–55)
AST: 14 U/L (ref 5–34)
Albumin: 3.1 g/dL — ABNORMAL LOW (ref 3.5–5.0)
Anion Gap: 9 mEq/L (ref 3–11)
BILIRUBIN TOTAL: 0.55 mg/dL (ref 0.20–1.20)
BUN: 6.8 mg/dL — AB (ref 7.0–26.0)
CALCIUM: 8.7 mg/dL (ref 8.4–10.4)
CO2: 22 mEq/L (ref 22–29)
CREATININE: 0.7 mg/dL (ref 0.6–1.1)
Chloride: 110 mEq/L — ABNORMAL HIGH (ref 98–109)
EGFR: >90 mL/min/{1.73_m2} (ref >90)
GLUCOSE: 122 mg/dL (ref 70–140)
POTASSIUM: 3.7 meq/L (ref 3.5–5.1)
SODIUM: 141 meq/L (ref 136–145)
TOTAL PROTEIN: 5.4 g/dL — AB (ref 6.4–8.3)

## 2016-02-19 MED ORDER — SODIUM CHLORIDE 0.9 % IJ SOLN
10.0000 mL | INTRAMUSCULAR | Status: DC | PRN
  Administered 2016-02-19: 10 mL via INTRAVENOUS
  Filled 2016-02-19: qty 10

## 2016-02-19 MED ORDER — AMOXICILLIN 500 MG PO TABS: 500.0000 mg | ORAL_TABLET | Freq: Two times a day (BID) | ORAL | Status: DC

## 2016-02-19 MED ORDER — HEPARIN SOD (PORK) LOCK FLUSH 100 UNIT/ML IV SOLN
500.0000 [IU] | Freq: Once | INTRAVENOUS | Status: AC | PRN
  Administered 2016-02-19: 500 [IU] via INTRAVENOUS
  Filled 2016-02-19: qty 5

## 2016-02-19 NOTE — Assessment & Plan Note (Signed)
She has very poor oral hygiene and has not obtained dental clearance for the last 3 years since I known her. Recently, she has oral infection and was referred to tertiary center for complete extraction but is still waiting to hear back. Recommend she calls er dentist office for further clarification about the referral. With her neutropenic state and evidence of oral infection and poor dental hygiene, I will give her 10 days course of amoxicillin.

## 2016-02-19 NOTE — Assessment & Plan Note (Signed)
One of the recent, acute COPD exacerbation last year nearly caused respiratory failure to the point of death. She had recurrent admission to the hospital last year When she was sick, she quit smoking. Now that she is better, she resumed smoking again. Again, I spent some time explaining to her the importance of nicotine cessation. 

## 2016-02-19 NOTE — Assessment & Plan Note (Signed)
I reviewed the most recent blood work with her. Her serum light chain is stable and she is responding well to treatment She has no recurrence of admission to the hospital. Her pain appears to be under control. We will continue Pomalidomide for 21 days on, 7 days off  However, with ongoing oral infection, I will continue to hold off Daratumumab Reassess next week

## 2016-02-19 NOTE — Telephone Encounter (Signed)
left msg for may appt °

## 2016-02-19 NOTE — Assessment & Plan Note (Signed)
She just completed a course of chemotherapy last week. The mild neutropenia is expected. As above, I will hold Daratumumab She will continue on Pomalidomide only until her infection issues resolved.

## 2016-02-19 NOTE — Assessment & Plan Note (Signed)
She has stage II pressure sore on the left heel. I will consult advanced home care to follow. I took pictures today. I recommend elevation of her legs and to wear different shoes

## 2016-02-19 NOTE — Progress Notes (Signed)
[place] OFFICE PROGRESS NOTE  Patient Care Team: Marin Olp, MD as PCP - General (Family Medicine) Jeanann Lewandowsky, MD as Consulting Physician (Medical Oncology) Marin Olp, MD as Consulting Physician (Family Medicine) Heath Lark, MD as Consulting Physician (Hematology and Oncology)  SUMMARY OF ONCOLOGIC HISTORY: Oncology History   Multiple myeloma, kappa light chain disease, Durie-Salmon stage III       Multiple myeloma in relapse ([place])   07/16/2006 Bone Marrow Biopsy BM biopsy is non-diagnostic   09/21/2006 Procedure L5 vertebral biopsy 5% plasma cell   10/25/2006 Bone Marrow Biopsy BM biopsy is hypercellular with 5% plasma cell   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   04/13/2011 Relapse/Recurrence Disease relapse   04/14/2011 Bone Marrow Biopsy Bm biopsy showed 2 % plasma cell   04/27/2011 Relapse/Recurrence Disease relapse, treated with Velcade/Cytoxan/Dex   09/07/2011 - 07/28/2013 Chemotherapy She has been receiving Velcade   12/27/2011 Bone Marrow Transplant 2nd transplant at [facility]   07/28/2013 Relapse/Recurrence Chemo is stopped due to progression of disease   08/29/2013 Imaging PEt/CT showed recurrence of disease with new lesion on her rib with compression fracture   09/13/2013 Bone Marrow Biopsy BM biopsy is hypercellular with 6% plasma cell   10/10/2013 - 10/20/2013 Radiation Therapy Started on palliative XRT for rib pain   11/27/2013 - 04/06/2014 Chemotherapy The patient starts chemotherapy with Carfilzomib   07/19/2014 Imaging Repeat bloodwork and PET/CT scan shows significant disease progression.   08/02/2014 - 03/06/2015 Chemotherapy She enrolled in clinical trial using combination therapy with Revlimid, dexamethasone and Elotuzumab   03/07/2015 - 06/04/2015 Chemotherapy She is on maintenance Revlimid only without dexamethasone   05/27/2015 Imaging  MRI spine showed new compression fracture.   06/04/2015 - 06/18/2015 Radiation Therapy  she received palliative radiation therapy to 25 Gy C7-T1   07/10/2015 - 08/07/2015 Chemotherapy She received Elotuzumab, dex and Revlimid. Rx is stopped due to progression   08/01/2015 Imaging MRI of the spine was performed today due to new onset of worsening right hip pain/flank area. MRI show mild progression of the L1 vertebral body.   08/21/2015 - 08/26/2015 Hospital Admission She was admitted to the hospital due to pathologic fracture to right femur   08/22/2015 Surgery She had IM nail to fractured femur   08/23/2015 - 09/20/2015 Radiation Therapy She received XRT to her L1 L2 and Right Femur   09/05/2015 - 09/06/2015 Hospital Admission She had recurrent admission due to displaced fracture, managed conservatively   09/12/2015 - 09/17/2015 Hospital Admission She had recurrent admission for COPD exacerbation   09/30/2015 -  Chemotherapy She was started on Pomalyst   12/10/2015 -  Chemotherapy She started weekly Daratumumab along with Pomalyst    INTERVAL HISTORY: Please see below for problem oriented charting. She returns today for further follow-up. Her treatment was recently placed on hold due to oral infection. She is awaiting for call back regarding her dental referral. In the meantime, over the past week or so, she developed blisters on her heels. With her severe headache, she had injection over the right neck and was started on gabapentin which helped with her headache tremendously. The patient continues to smoke. Her  bone pain appears to be under control  REVIEW OF SYSTEMS:   Constitutional: Denies fevers, chills or abnormal weight loss Eyes: Denies blurriness  of vision Ears, nose, mouth, throat, and face: Denies mucositis or sore throat Respiratory: Denies cough, dyspnea or wheezes Cardiovascular: Denies palpitation, chest discomfort  Gastrointestinal:  Denies nausea, heartburn or change in bowel habits Skin: Denies abnormal skin rashes Lymphatics: Denies new lymphadenopathy or easy bruising Neurological:Denies numbness, tingling or new weaknesses Behavioral/Psych: Mood is stable, no new changes  All other systems were reviewed with the patient and are negative.  I have reviewed the past medical history, past surgical history, social history and family history with the patient and they are unchanged from previous note.  ALLERGIES:  is allergic to codeine; ibuprofen; and albuterol.  MEDICATIONS:  Current Outpatient Prescriptions  Medication Sig Dispense Refill  . acyclovir (ZOVIRAX) 400 MG tablet Take 1 tablet (400 mg total) by mouth daily. 30 tablet 6  . ALPRAZolam (XANAX) 0.25 MG tablet Take 1 tablet (0.25 mg total) by mouth at bedtime as needed for anxiety. 60 tablet 0  . aspirin 81 MG tablet Take 81 mg by mouth daily.    . budesonide-formoterol (SYMBICORT) 160-4.5 MCG/ACT inhaler Take 2 puffs first thing in am and then another 2 puffs about 12 hours later. 1 Inhaler 11  . Cholecalciferol (VITAMIN D3) 5000 UNITS CAPS Take 5,000 Units by mouth daily.     . cyclobenzaprine (FLEXERIL) 10 MG tablet Take 1 tablet (10 mg total) by mouth 3 (three) times daily as needed for muscle spasms. 15 tablet 0  . diazepam (VALIUM) 10 MG tablet Take 1 tablet (10 mg total) by mouth every 6 (six) hours as needed (muscle spasm). 20 tablet 0  . fluticasone-salmeterol (ADVAIR HFA) 115-21 MCG/ACT inhaler Inhale 2 puffs into the lungs 2 (two) times daily. 1 Inhaler 5  . gabapentin (NEURONTIN) 600 MG tablet Take 1 tablet (600 mg total) by mouth 3 (three) times daily. 90 tablet 3  . ibuprofen (ADVIL,MOTRIN) 600 MG tablet Take 1 tablet (600 mg total) by mouth every 8  (eight) hours as needed. 20 tablet 0  . ipratropium (ATROVENT HFA) 17 MCG/ACT inhaler Inhale 2 puffs into the lungs every 4 (four) hours as needed for wheezing. 1 Inhaler 12  . levothyroxine (SYNTHROID, LEVOTHROID) 50 MCG tablet TAKE 1 TABLET (50 MCG TOTAL) BY MOUTH DAILY BEFORE BREAKFAST. 90 tablet 1  . lidocaine-prilocaine (EMLA) cream Apply 1 application topically as needed. 30 g 6  . losartan (COZAAR) 50 MG tablet Take 1 tablet (50 mg total) by mouth daily. 90 tablet 1  . magic mouthwash SOLN Take 10 mLs by mouth 4 (four) times daily as needed for mouth pain. Swish and spit  Or  Swish and swallow. 480 mL 2  . morphine (MS CONTIN) 100 MG 12 hr tablet Take 90 mg by mouth every 8 (eight) hours.     Marland Kitchen morphine (MSIR) 30 MG tablet Take 1-1.5 tablets (30-45 mg total) by mouth every 2 (two) hours as needed for moderate pain or severe pain. (Patient taking differently: Take 30-45 mg by mouth every 2 (two) hours as needed for moderate pain or severe pain. Take one tablet every 2 hours as needed for moderate pain) 30 tablet 0  . nicotine (NICODERM CQ - DOSED IN MG/24 HOURS) 14 mg/24hr patch Place 1 patch (14 mg total) onto the skin daily. 28 patch 0  . pomalidomide (POMALYST) 3 MG capsule Take 1 capsule (3 mg total) by mouth  daily. Take with water on days 1-21. Repeat every 28 days. 21 capsule 0  . senna-docusate (SENOKOT-S) 8.6-50 MG tablet Take 1 tablet by mouth daily.    Marland Kitchen SPIRIVA HANDIHALER 18 MCG inhalation capsule Take 2 puffs by mouth 4 (four) times daily.    Marland Kitchen amoxicillin (AMOXIL) 500 MG tablet Take 1 tablet (500 mg total) by mouth 2 (two) times daily. 20 tablet 0  . [DISCONTINUED] amLODipine (NORVASC) 5 MG tablet Take 1 tablet (5 mg total) by mouth daily. 90 tablet 3   No current facility-administered medications for this visit.   Facility-Administered Medications Ordered in Other Visits  Medication Dose Route Frequency Provider Last Rate Last Dose  . sodium chloride 0.9 % injection 10 mL  10  mL Intravenous PRN Heath Lark, MD   10 mL at 02/19/16 0911    PHYSICAL EXAMINATION: ECOG PERFORMANCE STATUS: 2 - Symptomatic, <50% confined to bed  Filed Vitals:   02/19/16 0929  BP: 156/83  Pulse: 78  Temp: 98.6 F (37 C)  Resp: 18   Filed Weights   02/19/16 0929  Weight: 161 lb 6.4 oz (73.211 kg)    GENERAL:alert, no distress and comfortable SKIN: skin color, texture, turgor are normal, no rashes or significant lesions. Noted pressure sore over the left heel EYES: normal, Conjunctiva are pink and non-injected, sclera clear OROPHARYNX:no exudate, no erythema and lips, buccal mucosa, and tongue normal . She has very poor oral hygiene NECK: supple, thyroid normal size, non-tender, without nodularity LYMPH:  no palpable lymphadenopathy in the cervical, axillary or inguinal LUNGS: clear to auscultation and percussion with normal breathing effort HEART: regular rate & rhythm and no murmurs with moderate bilateral lower extremity edema ABDOMEN:abdomen soft, non-tender and normal bowel sounds Musculoskeletal:no cyanosis of digits and no clubbing  NEURO: alert & oriented x 3 with fluent speech, no focal motor/sensory deficits  LABORATORY DATA:  I have reviewed the data as listed    Component Value Date/Time   NA 141 02/19/2016 0901   NA 141 09/11/2015 2255   K 3.7 02/19/2016 0901   K 4.3 09/11/2015 2255   CL 107 09/11/2015 2255   CL 107 03/10/2013 1014   CO2 22 02/19/2016 0901   CO2 27 09/11/2015 2255   GLUCOSE 122 02/19/2016 0901   GLUCOSE 106* 09/11/2015 2255   GLUCOSE 111* 03/10/2013 1014   GLUCOSE 98 08/31/2006 1024   BUN 6.8* 02/19/2016 0901   BUN 16 09/11/2015 2255   CREATININE 0.7 02/19/2016 0901   CREATININE 0.72 09/12/2015 0620   CALCIUM 8.7 02/19/2016 0901   CALCIUM 9.1 09/11/2015 2255   PROT 5.4* 02/19/2016 0901   PROT 5.7* 12/31/2015 0842   PROT 6.6 09/11/2014 0336   ALBUMIN 3.1* 02/19/2016 0901   ALBUMIN 3.4* 09/11/2014 0336   AST 14 02/19/2016 0901    AST 18 09/11/2014 0336   ALT <9 02/19/2016 0901   ALT 18 09/11/2014 0336   ALKPHOS 92 02/19/2016 0901   ALKPHOS 113 09/11/2014 0336   BILITOT 0.55 02/19/2016 0901   BILITOT 0.6 09/11/2014 0336   GFRNONAA >60 09/12/2015 0620   GFRAA >60 09/12/2015 0620    No results found for: SPEP, UPEP  Lab Results  Component Value Date   WBC 3.4* 02/19/2016   NEUTROABS 1.1* 02/19/2016   HGB 11.6 02/19/2016   HCT 37.2 02/19/2016   MCV 93.7 02/19/2016   PLT 263 02/19/2016      Chemistry      Component Value Date/Time   NA 141  02/19/2016 0901   NA 141 09/11/2015 2255   K 3.7 02/19/2016 0901   K 4.3 09/11/2015 2255   CL 107 09/11/2015 2255   CL 107 03/10/2013 1014   CO2 22 02/19/2016 0901   CO2 27 09/11/2015 2255   BUN 6.8* 02/19/2016 0901   BUN 16 09/11/2015 2255   CREATININE 0.7 02/19/2016 0901   CREATININE 0.72 09/12/2015 0620      Component Value Date/Time   CALCIUM 8.7 02/19/2016 0901   CALCIUM 9.1 09/11/2015 2255   ALKPHOS 92 02/19/2016 0901   ALKPHOS 113 09/11/2014 0336   AST 14 02/19/2016 0901   AST 18 09/11/2014 0336   ALT <9 02/19/2016 0901   ALT 18 09/11/2014 0336   BILITOT 0.55 02/19/2016 0901   BILITOT 0.6 09/11/2014 0336        ASSESSMENT & PLAN:  Multiple myeloma in relapse ([place]) I reviewed the most recent blood work with her. Her serum light chain is stable and she is responding well to treatment She has no recurrence of admission to the hospital. Her pain appears to be under control. We will continue Pomalidomide for 21 days on, 7 days off  However, with ongoing oral infection, I will continue to hold off Daratumumab Reassess next week  Drug-induced neutropenia ([place]) She just completed a course of chemotherapy last week. The mild neutropenia is expected. As above, I will hold Daratumumab She will continue on Pomalidomide only until her infection issues resolved.  Essential hypertension Her blood pressure is elevated  she will continue current  medical management. I recommend close follow-up with primary care doctor for medication adjustment.   Cigarette smoker One of the recent, acute COPD exacerbation last year nearly caused respiratory failure to the point of death. She had recurrent admission to the hospital last year When she was sick, she quit smoking. Now that she is better, she resumed smoking again. Again, I spent some time explaining to her the importance of nicotine cessation.  Pressure sore on heel She has stage II pressure sore on the left heel. I will consult advanced home care to follow. I took pictures today. I recommend elevation of her legs and to wear different shoes  Oral infection She has very poor oral hygiene and has not obtained dental clearance for the last 3 years since I known her. Recently, she has oral infection and was referred to tertiary center for complete extraction but is still waiting to hear back. Recommend she calls er dentist office for further clarification about the referral. With her neutropenic state and evidence of oral infection and poor dental hygiene, I will give her 10 days course of amoxicillin.   Orders Placed This Encounter  Procedures  . Multiple Myeloma Panel (SPEP&IFE w/QIG)    Standing Status: Future     Number of Occurrences:      Standing Expiration Date: 03/25/2017  . Kappa/lambda light chains    Standing Status: Future     Number of Occurrences:      Standing Expiration Date: 03/25/2017   All questions were answered. The patient knows to call the clinic with any problems, questions or concerns. No barriers to learning was detected. I spent 30 minutes counseling the patient face to face. The total time spent in the appointment was 40 minutes and more than 50% was on counseling and review of test results     [facility], [place], MD 02/19/2016 12:16 PM

## 2016-02-19 NOTE — Assessment & Plan Note (Signed)
Her blood pressure is elevated she will continue current medical management. I recommend close follow-up with primary care doctor for medication adjustment.  

## 2016-02-19 NOTE — Progress Notes (Signed)
Order for home care nursing services for wound care placed in Epic.  Notified Edwinna Areola, RN home care coordinator w/ [facility] of new order.

## 2016-02-20 DIAGNOSIS — I10 Essential (primary) hypertension: Secondary | ICD-10-CM | POA: Diagnosis not present

## 2016-02-20 DIAGNOSIS — J449 Chronic obstructive pulmonary disease, unspecified: Secondary | ICD-10-CM | POA: Diagnosis not present

## 2016-02-20 DIAGNOSIS — D702 Other drug-induced agranulocytosis: Secondary | ICD-10-CM | POA: Diagnosis not present

## 2016-02-20 DIAGNOSIS — C9002 Multiple myeloma in relapse: Secondary | ICD-10-CM | POA: Diagnosis not present

## 2016-02-20 DIAGNOSIS — T451X5D Adverse effect of antineoplastic and immunosuppressive drugs, subsequent encounter: Secondary | ICD-10-CM | POA: Diagnosis not present

## 2016-02-20 DIAGNOSIS — L89622 Pressure ulcer of left heel, stage 2: Secondary | ICD-10-CM | POA: Diagnosis not present

## 2016-02-20 DIAGNOSIS — Z720 Tobacco use: Secondary | ICD-10-CM | POA: Diagnosis not present

## 2016-02-21 ENCOUNTER — Encounter: Payer: Self-pay | Admitting: Hematology and Oncology

## 2016-02-21 DIAGNOSIS — C9002 Multiple myeloma in relapse: Secondary | ICD-10-CM | POA: Diagnosis not present

## 2016-02-21 DIAGNOSIS — D702 Other drug-induced agranulocytosis: Secondary | ICD-10-CM | POA: Diagnosis not present

## 2016-02-21 DIAGNOSIS — J449 Chronic obstructive pulmonary disease, unspecified: Secondary | ICD-10-CM | POA: Diagnosis not present

## 2016-02-21 DIAGNOSIS — I10 Essential (primary) hypertension: Secondary | ICD-10-CM | POA: Diagnosis not present

## 2016-02-21 DIAGNOSIS — L89622 Pressure ulcer of left heel, stage 2: Secondary | ICD-10-CM | POA: Diagnosis not present

## 2016-02-21 DIAGNOSIS — T451X5D Adverse effect of antineoplastic and immunosuppressive drugs, subsequent encounter: Secondary | ICD-10-CM | POA: Diagnosis not present

## 2016-02-21 NOTE — Progress Notes (Signed)
Per [facility] pomalyst was shipped via [facility] 02/20/16

## 2016-02-24 DIAGNOSIS — C9002 Multiple myeloma in relapse: Secondary | ICD-10-CM | POA: Diagnosis not present

## 2016-02-24 DIAGNOSIS — D702 Other drug-induced agranulocytosis: Secondary | ICD-10-CM | POA: Diagnosis not present

## 2016-02-24 DIAGNOSIS — L89622 Pressure ulcer of left heel, stage 2: Secondary | ICD-10-CM | POA: Diagnosis not present

## 2016-02-24 DIAGNOSIS — I10 Essential (primary) hypertension: Secondary | ICD-10-CM | POA: Diagnosis not present

## 2016-02-24 DIAGNOSIS — J449 Chronic obstructive pulmonary disease, unspecified: Secondary | ICD-10-CM | POA: Diagnosis not present

## 2016-02-24 DIAGNOSIS — T451X5D Adverse effect of antineoplastic and immunosuppressive drugs, subsequent encounter: Secondary | ICD-10-CM | POA: Diagnosis not present

## 2016-02-26 DIAGNOSIS — M5481 Occipital neuralgia: Secondary | ICD-10-CM | POA: Diagnosis not present

## 2016-02-26 DIAGNOSIS — G894 Chronic pain syndrome: Secondary | ICD-10-CM | POA: Diagnosis not present

## 2016-02-26 DIAGNOSIS — M47812 Spondylosis without myelopathy or radiculopathy, cervical region: Secondary | ICD-10-CM | POA: Diagnosis not present

## 2016-02-26 DIAGNOSIS — Z79891 Long term (current) use of opiate analgesic: Secondary | ICD-10-CM | POA: Diagnosis not present

## 2016-02-27 DIAGNOSIS — J449 Chronic obstructive pulmonary disease, unspecified: Secondary | ICD-10-CM | POA: Diagnosis not present

## 2016-02-27 DIAGNOSIS — D702 Other drug-induced agranulocytosis: Secondary | ICD-10-CM | POA: Diagnosis not present

## 2016-02-27 DIAGNOSIS — L89622 Pressure ulcer of left heel, stage 2: Secondary | ICD-10-CM | POA: Diagnosis not present

## 2016-02-27 DIAGNOSIS — T451X5D Adverse effect of antineoplastic and immunosuppressive drugs, subsequent encounter: Secondary | ICD-10-CM | POA: Diagnosis not present

## 2016-02-27 DIAGNOSIS — C9002 Multiple myeloma in relapse: Secondary | ICD-10-CM | POA: Diagnosis not present

## 2016-02-27 DIAGNOSIS — I10 Essential (primary) hypertension: Secondary | ICD-10-CM | POA: Diagnosis not present

## 2016-02-28 ENCOUNTER — Telehealth: Payer: Self-pay | Admitting: Hematology and Oncology

## 2016-02-28 NOTE — Telephone Encounter (Signed)
returned call and s.w pt and confirmed May appt....pt ok and aware

## 2016-03-04 ENCOUNTER — Encounter: Payer: Self-pay | Admitting: Hematology and Oncology

## 2016-03-04 ENCOUNTER — Ambulatory Visit: Payer: Medicare Other

## 2016-03-04 ENCOUNTER — Other Ambulatory Visit (HOSPITAL_BASED_OUTPATIENT_CLINIC_OR_DEPARTMENT_OTHER): Payer: Medicare Other

## 2016-03-04 ENCOUNTER — Telehealth: Payer: Self-pay | Admitting: Hematology and Oncology

## 2016-03-04 ENCOUNTER — Ambulatory Visit (HOSPITAL_BASED_OUTPATIENT_CLINIC_OR_DEPARTMENT_OTHER): Payer: Medicare Other | Admitting: Hematology and Oncology

## 2016-03-04 VITALS — BP 141/82 | HR 82 | Temp 98.3°F | Resp 18 | Wt 153.6 lb

## 2016-03-04 DIAGNOSIS — C9002 Multiple myeloma in relapse: Secondary | ICD-10-CM | POA: Diagnosis not present

## 2016-03-04 DIAGNOSIS — Z720 Tobacco use: Secondary | ICD-10-CM

## 2016-03-04 DIAGNOSIS — F17210 Nicotine dependence, cigarettes, uncomplicated: Secondary | ICD-10-CM

## 2016-03-04 DIAGNOSIS — L89622 Pressure ulcer of left heel, stage 2: Secondary | ICD-10-CM

## 2016-03-04 DIAGNOSIS — K089 Disorder of teeth and supporting structures, unspecified: Secondary | ICD-10-CM

## 2016-03-04 DIAGNOSIS — D702 Other drug-induced agranulocytosis: Secondary | ICD-10-CM

## 2016-03-04 DIAGNOSIS — C9001 Multiple myeloma in remission: Secondary | ICD-10-CM | POA: Diagnosis not present

## 2016-03-04 DIAGNOSIS — K122 Cellulitis and abscess of mouth: Secondary | ICD-10-CM

## 2016-03-04 LAB — COMPREHENSIVE METABOLIC PANEL
ALT: 11 U/L (ref 0–55)
ANION GAP: 10 meq/L (ref 3–11)
AST: 16 U/L (ref 5–34)
Albumin: 3.4 g/dL — ABNORMAL LOW (ref 3.5–5.0)
Alkaline Phosphatase: 100 U/L (ref 40–150)
BUN: 8.2 mg/dL (ref 7.0–26.0)
CHLORIDE: 110 meq/L — AB (ref 98–109)
CO2: 21 meq/L — AB (ref 22–29)
Calcium: 9 mg/dL (ref 8.4–10.4)
Creatinine: 0.7 mg/dL (ref 0.6–1.1)
EGFR: >90 mL/min/{1.73_m2} (ref >90)
GLUCOSE: 111 mg/dL (ref 70–140)
POTASSIUM: 3.9 meq/L (ref 3.5–5.1)
SODIUM: 140 meq/L (ref 136–145)
TOTAL PROTEIN: 5.8 g/dL — AB (ref 6.4–8.3)
Total Bilirubin: 0.52 mg/dL (ref 0.20–1.20)

## 2016-03-04 LAB — CBC WITH DIFFERENTIAL/PLATELET
BASO%: 1.7 % (ref 0.0–2.0)
Basophils Absolute: 0.1 10*3/uL (ref 0.0–0.1)
EOS ABS: 0.5 10*3/uL (ref 0.0–0.5)
EOS%: 8.5 % — ABNORMAL HIGH (ref 0.0–7.0)
HCT: 39.3 % (ref 34.8–46.6)
HGB: 12.6 g/dL (ref 11.6–15.9)
LYMPH%: 14.9 % (ref 14.0–49.7)
MCH: 29.7 pg (ref 25.1–34.0)
MCHC: 32.1 g/dL (ref 31.5–36.0)
MCV: 92.7 fL (ref 79.5–101.0)
MONO#: 0.8 10*3/uL (ref 0.1–0.9)
MONO%: 15.3 % — AB (ref 0.0–14.0)
NEUT%: 59.6 % (ref 38.4–76.8)
NEUTROS ABS: 3.2 10*3/uL (ref 1.5–6.5)
PLATELETS: 207 10*3/uL (ref 145–400)
RBC: 4.24 10*6/uL (ref 3.70–5.45)
RDW: 16.6 % — ABNORMAL HIGH (ref 11.2–14.5)
WBC: 5.3 10*3/uL (ref 3.9–10.3)
lymph#: 0.8 10*3/uL — ABNORMAL LOW (ref 0.9–3.3)

## 2016-03-04 MED ORDER — ALPRAZOLAM 0.25 MG PO TABS: 0.2500 mg | ORAL_TABLET | Freq: Every evening | ORAL | Status: DC | PRN

## 2016-03-04 MED ORDER — SODIUM CHLORIDE 0.9 % IJ SOLN
10.0000 mL | INTRAMUSCULAR | Status: DC | PRN
  Administered 2016-03-04: 10 mL via INTRAVENOUS
  Filled 2016-03-04: qty 10

## 2016-03-04 NOTE — Assessment & Plan Note (Signed)
She has stage II pressure sore on the left heel This is improved compared to a week ago. I will consult advanced home care to follow. I recommend elevation of her legs and to wear different shoes Continue conservative management.

## 2016-03-04 NOTE — Assessment & Plan Note (Signed)
This is likely due to recent treatment. The patient denies recent history of fevers, cough, chills, diarrhea or dysuria. She is asymptomatic from the leukopenia. I will observe for now.  I will continue the chemotherapy at current dose without dosage adjustment.  If the leukopenia gets progressive worse in the future, I might have to delay her treatment or adjust the chemotherapy dose. As above, I will hold Daratumumab She will continue on Pomalidomide only

## 2016-03-04 NOTE — Assessment & Plan Note (Signed)
One of the recent, acute COPD exacerbation last year nearly caused respiratory failure to the point of death. She had recurrent admission to the hospital last year When she was sick, she quit smoking. Now that she is better, she resumed smoking again. Again, I spent some time explaining to her the importance of nicotine cessation. 

## 2016-03-04 NOTE — Progress Notes (Signed)
[place] OFFICE PROGRESS NOTE  Patient Care Team: Marin Olp, MD as PCP - General (Family Medicine) Jeanann Lewandowsky, MD as Consulting Physician (Medical Oncology) Marin Olp, MD as Consulting Physician (Family Medicine) Heath Lark, MD as Consulting Physician (Hematology and Oncology)  SUMMARY OF ONCOLOGIC HISTORY: Oncology History   Multiple myeloma, kappa light chain disease, Durie-Salmon stage III       Multiple myeloma in remission ([place])   07/16/2006 Bone Marrow Biopsy BM biopsy is non-diagnostic   09/21/2006 Procedure L5 vertebral biopsy 5% plasma cell   10/25/2006 Bone Marrow Biopsy BM biopsy is hypercellular with 5% plasma cell   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   01/03/2007 - 01/10/2007 Radiation Therapy Approximate date only, received RT for plasmacytoma followed by surgery   09/14/2007 Initial Diagnosis MULTIPLE  MYELOMA   10/05/2007 Bone Marrow Biopsy Bm biopsy was negative   06/18/2008 Bone Marrow Biopsy BM biopsy was negative   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 [facility]   04/13/2011 Relapse/Recurrence Disease relapse   04/14/2011 Bone Marrow Biopsy Bm biopsy showed 2 % plasma cell   04/27/2011 Relapse/Recurrence Disease relapse, treated with Velcade/Cytoxan/Dex   09/07/2011 - 07/28/2013 Chemotherapy She has been receiving Velcade   12/27/2011 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   08/02/2014 - 03/06/2015 Chemotherapy She enrolled in clinical trial using combination therapy with Revlimid, dexamethasone and Elotuzumab   03/07/2015 - 06/04/2015 Chemotherapy She is on maintenance Revlimid only without dexamethasone   05/27/2015 Imaging  MRI spine showed new compression fracture.   06/04/2015 - 06/18/2015 Radiation Therapy  she received palliative radiation therapy to 25 Gy C7-T1   07/10/2015 - 08/07/2015 Chemotherapy She received Elotuzumab, dex and Revlimid. Rx is stopped due to progression   08/01/2015 Imaging MRI of the spine was performed today due to new onset of worsening right hip pain/flank area. MRI show mild progression of the L1 vertebral body.   08/21/2015 - 08/26/2015 Hospital Admission She was admitted to the hospital due to pathologic fracture to right femur   08/22/2015 Surgery She had IM nail to fractured femur   08/23/2015 - 09/20/2015 Radiation Therapy She received XRT to her L1 L2 and Right Femur   09/05/2015 - 09/06/2015 Hospital Admission She had recurrent admission due to displaced fracture, managed conservatively   09/12/2015 - 09/17/2015 Hospital Admission She had recurrent admission for COPD exacerbation   09/30/2015 -  Chemotherapy She was started on Pomalyst   12/10/2015 -  Chemotherapy She started weekly Daratumumab along with Pomalyst    INTERVAL HISTORY: Please see below for problem oriented charting. She returns for further follow-up. She is still waiting to hear back regarding dental extraction Her foot is healing slowly with conservative management and antibiotic therapy She continues to complain of severe bone pain She continues to smoke She denies recent COPD exacerbation  REVIEW OF SYSTEMS:   Constitutional: Denies fevers, chills or abnormal weight loss Eyes: Denies blurriness of vision Ears, nose, mouth, throat, and face: Denies mucositis  or sore throat Respiratory: Denies cough, dyspnea or wheezes Cardiovascular: Denies palpitation, chest  discomfort or lower extremity swelling Gastrointestinal:  Denies nausea, heartburn or change in bowel habits Lymphatics: Denies new lymphadenopathy or easy bruising Neurological:Denies numbness, tingling or new weaknesses Behavioral/Psych: Mood is stable, no new changes  All other systems were reviewed with the patient and are negative.  I have reviewed the past medical history, past surgical history, social history and family history with the patient and they are unchanged from previous note.  ALLERGIES:  is allergic to codeine; ibuprofen; and albuterol.  MEDICATIONS:  Current Outpatient Prescriptions  Medication Sig Dispense Refill  . acyclovir (ZOVIRAX) 400 MG tablet Take 1 tablet (400 mg total) by mouth daily. 30 tablet 6  . ALPRAZolam (XANAX) 0.25 MG tablet Take 1 tablet (0.25 mg total) by mouth at bedtime as needed for anxiety. 60 tablet 0  . aspirin 81 MG tablet Take 81 mg by mouth daily.    . budesonide-formoterol (SYMBICORT) 160-4.5 MCG/ACT inhaler Take 2 puffs first thing in am and then another 2 puffs about 12 hours later. 1 Inhaler 11  . Cholecalciferol (VITAMIN D3) 5000 UNITS CAPS Take 5,000 Units by mouth daily.     . fluticasone-salmeterol (ADVAIR HFA) 115-21 MCG/ACT inhaler Inhale 2 puffs into the lungs 2 (two) times daily. 1 Inhaler 5  . gabapentin (NEURONTIN) 600 MG tablet Take 1 tablet (600 mg total) by mouth 3 (three) times daily. 90 tablet 3  . ibuprofen (ADVIL,MOTRIN) 600 MG tablet Take 1 tablet (600 mg total) by mouth every 8 (eight) hours as needed. 20 tablet 0  . ipratropium (ATROVENT HFA) 17 MCG/ACT inhaler Inhale 2 puffs into the lungs every 4 (four) hours as needed for wheezing. 1 Inhaler 12  . levothyroxine (SYNTHROID, LEVOTHROID) 50 MCG tablet TAKE 1 TABLET (50 MCG TOTAL) BY MOUTH DAILY BEFORE BREAKFAST. 90 tablet 1  . lidocaine-prilocaine (EMLA) cream Apply 1 application topically as needed. 30 g 6  . losartan (COZAAR) 50 MG tablet Take 1 tablet (50 mg total)  by mouth daily. 90 tablet 1  . magic mouthwash SOLN Take 10 mLs by mouth 4 (four) times daily as needed for mouth pain. Swish and spit  Or  Swish and swallow. 480 mL 2  . morphine (MS CONTIN) 100 MG 12 hr tablet Take 90 mg by mouth every 8 (eight) hours.     Marland Kitchen morphine (MSIR) 30 MG tablet Take 1-1.5 tablets (30-45 mg total) by mouth every 2 (two) hours as needed for moderate pain or severe pain. (Patient taking differently: Take 30-45 mg by mouth every 2 (two) hours as needed for moderate pain or severe pain. Take one tablet every 2 hours as needed for moderate pain) 30 tablet 0  . pomalidomide (POMALYST) 3 MG capsule Take 1 capsule (3 mg total) by mouth daily. Take with water on days 1-21. Repeat every 28 days. 21 capsule 0  . prochlorperazine (COMPAZINE) 10 MG tablet Take 10 mg by mouth every 6 (six) hours as needed. for nausea  3  . SPIRIVA HANDIHALER 18 MCG inhalation capsule Take 2 puffs by mouth 4 (four) times daily.    . cyclobenzaprine (FLEXERIL) 10 MG tablet Take 1 tablet (10 mg total) by mouth 3 (three) times daily as needed for muscle spasms. (Patient not taking: Reported on 03/04/2016) 15 tablet 0  . diazepam (VALIUM) 10 MG tablet Take 1 tablet (10 mg total) by mouth every 6 (six) hours as needed (muscle spasm). (Patient not taking: Reported  on 03/04/2016) 20 tablet 0  . nicotine (NICODERM CQ - DOSED IN MG/24 HOURS) 14 mg/24hr patch Place 1 patch (14 mg total) onto the skin daily. (Patient not taking: Reported on 03/04/2016) 28 patch 0  . oxyCODONE (ROXICODONE) 15 MG immediate release tablet Reported on 03/04/2016    . senna-docusate (SENOKOT-S) 8.6-50 MG tablet Take 1 tablet by mouth daily. (Patient not taking: Reported on 03/04/2016)    . [DISCONTINUED] amLODipine (NORVASC) 5 MG tablet Take 1 tablet (5 mg total) by mouth daily. 90 tablet 3   No current facility-administered medications for this visit.   Facility-Administered Medications Ordered in Other Visits  Medication Dose Route Frequency  Provider Last Rate Last Dose  . sodium chloride 0.9 % injection 10 mL  10 mL Intravenous PRN Heath Lark, MD   10 mL at 02/19/16 0911  . sodium chloride 0.9 % injection 10 mL  10 mL Intravenous PRN Heath Lark, MD        PHYSICAL EXAMINATION: ECOG PERFORMANCE STATUS: 1 - Symptomatic but completely ambulatory  Filed Vitals:   03/04/16 1025  BP: 141/82  Pulse: 82  Temp: 98.3 F (36.8 C)  Resp: 18   Filed Weights   03/04/16 1025  Weight: 153 lb 9.6 oz (69.673 kg)    GENERAL:alert, no distress and comfortable SKIN: skin color, texture, turgor are normal, no rashes or significant lesions EYES: normal, Conjunctiva are pink and non-injected, sclera clear OROPHARYNX:no exudate, no erythema and lips, buccal mucosa, and tongue normal. She has very poor dentition. No frank abscess Musculoskeletal:no cyanosis of digits and no clubbing . The left heel pressure sore is improving NEURO: alert & oriented x 3 with fluent speech, no focal motor/sensory deficits  LABORATORY DATA:  I have reviewed the data as listed    Component Value Date/Time   NA 140 03/04/2016 0949   NA 141 09/11/2015 2255   K 3.9 03/04/2016 0949   K 4.3 09/11/2015 2255   CL 107 09/11/2015 2255   CL 107 03/10/2013 1014   CO2 21* 03/04/2016 0949   CO2 27 09/11/2015 2255   GLUCOSE 111 03/04/2016 0949   GLUCOSE 106* 09/11/2015 2255   GLUCOSE 111* 03/10/2013 1014   GLUCOSE 98 08/31/2006 1024   BUN 8.2 03/04/2016 0949   BUN 16 09/11/2015 2255   CREATININE 0.7 03/04/2016 0949   CREATININE 0.72 09/12/2015 0620   CALCIUM 9.0 03/04/2016 0949   CALCIUM 9.1 09/11/2015 2255   PROT 5.8* 03/04/2016 0949   PROT 5.7* 12/31/2015 0842   PROT 6.6 09/11/2014 0336   ALBUMIN 3.4* 03/04/2016 0949   ALBUMIN 3.4* 09/11/2014 0336   AST 16 03/04/2016 0949   AST 18 09/11/2014 0336   ALT 11 03/04/2016 0949   ALT 18 09/11/2014 0336   ALKPHOS 100 03/04/2016 0949   ALKPHOS 113 09/11/2014 0336   BILITOT 0.52 03/04/2016 0949   BILITOT 0.6  09/11/2014 0336   GFRNONAA >60 09/12/2015 0620   GFRAA >60 09/12/2015 0620    No results found for: SPEP, UPEP  Lab Results  Component Value Date   WBC 5.3 03/04/2016   NEUTROABS 3.2 03/04/2016   HGB 12.6 03/04/2016   HCT 39.3 03/04/2016   MCV 92.7 03/04/2016   PLT 207 03/04/2016      Chemistry      Component Value Date/Time   NA 140 03/04/2016 0949   NA 141 09/11/2015 2255   K 3.9 03/04/2016 0949   K 4.3 09/11/2015 2255   CL 107 09/11/2015 2255  CL 107 03/10/2013 1014   CO2 21* 03/04/2016 0949   CO2 27 09/11/2015 2255   BUN 8.2 03/04/2016 0949   BUN 16 09/11/2015 2255   CREATININE 0.7 03/04/2016 0949   CREATININE 0.72 09/12/2015 0620      Component Value Date/Time   CALCIUM 9.0 03/04/2016 0949   CALCIUM 9.1 09/11/2015 2255   ALKPHOS 100 03/04/2016 0949   ALKPHOS 113 09/11/2014 0336   AST 16 03/04/2016 0949   AST 18 09/11/2014 0336   ALT 11 03/04/2016 0949   ALT 18 09/11/2014 0336   BILITOT 0.52 03/04/2016 0949   BILITOT 0.6 09/11/2014 0336      ASSESSMENT & PLAN:  Multiple myeloma in remission ([place]) I reviewed the most recent blood work with her. Her serum light chain is stable and she is responding well to treatment She has no recurrence of admission to the hospital. Her pain appears to be under control. We will continue Pomalidomide for 21 days on, 7 days off  However, with ongoing oral infection, I will continue to hold off Daratumumab Repeat myeloma panel today is pending. I will see her back a month from now for further evaluation  Pressure sore on heel She has stage II pressure sore on the left heel This is improved compared to a week ago. I will consult advanced home care to follow. I recommend elevation of her legs and to wear different shoes Continue conservative management.  Drug-induced neutropenia ([facility]) This is likely due to recent treatment. The patient denies recent history of fevers, cough, chills, diarrhea or dysuria. She is  asymptomatic from the leukopenia. I will observe for now.  I will continue the chemotherapy at current dose without dosage adjustment.  If the leukopenia gets progressive worse in the future, I might have to delay her treatment or adjust the chemotherapy dose. As above, I will hold Daratumumab She will continue on Pomalidomide only  Oral infection She has very poor oral hygiene and has not obtained dental clearance for the last 3 years since I known her. Recently, she has oral infection and was referred to tertiary center for complete extraction but is still waiting to hear back. Recommend she calls her dentist office for further clarification about the referral. Color infection risk on that well to recent 10 days course of amoxicillin.  Cigarette smoker One of the recent, acute COPD exacerbation last year nearly caused respiratory failure to the point of death. She had recurrent admission to the hospital last year When she was sick, she quit smoking. Now that she is better, she resumed smoking again. Again, I spent some time explaining to her the importance of nicotine cessation.   No orders of the defined types were placed in this encounter.   All questions were answered. The patient knows to call the clinic with any problems, questions or concerns. No barriers to learning was detected. I spent 20 minutes counseling the patient face to face. The total time spent in the appointment was 25 minutes and more than 50% was on counseling and review of test results     [facility], [place], MD 03/04/2016 11:02 AM

## 2016-03-04 NOTE — Assessment & Plan Note (Signed)
She has very poor oral hygiene and has not obtained dental clearance for the last 3 years since I known her. Recently, she has oral infection and was referred to tertiary center for complete extraction but is still waiting to hear back. Recommend she calls her dentist office for further clarification about the referral. Color infection risk on that well to recent 10 days course of amoxicillin.

## 2016-03-04 NOTE — Telephone Encounter (Signed)
left msg for 6/7 appt per pof

## 2016-03-04 NOTE — Assessment & Plan Note (Signed)
I reviewed the most recent blood work with her. Her serum light chain is stable and she is responding well to treatment She has no recurrence of admission to the hospital. Her pain appears to be under control. We will continue Pomalidomide for 21 days on, 7 days off  However, with ongoing oral infection, I will continue to hold off Daratumumab Repeat myeloma panel today is pending. I will see her back a month from now for further evaluation

## 2016-03-05 DIAGNOSIS — C9002 Multiple myeloma in relapse: Secondary | ICD-10-CM | POA: Diagnosis not present

## 2016-03-05 DIAGNOSIS — J449 Chronic obstructive pulmonary disease, unspecified: Secondary | ICD-10-CM | POA: Diagnosis not present

## 2016-03-05 DIAGNOSIS — I10 Essential (primary) hypertension: Secondary | ICD-10-CM | POA: Diagnosis not present

## 2016-03-05 DIAGNOSIS — T451X5D Adverse effect of antineoplastic and immunosuppressive drugs, subsequent encounter: Secondary | ICD-10-CM | POA: Diagnosis not present

## 2016-03-05 DIAGNOSIS — D702 Other drug-induced agranulocytosis: Secondary | ICD-10-CM | POA: Diagnosis not present

## 2016-03-05 DIAGNOSIS — L89622 Pressure ulcer of left heel, stage 2: Secondary | ICD-10-CM | POA: Diagnosis not present

## 2016-03-05 LAB — MULTIPLE MYELOMA PANEL, SERUM
ALBUMIN/GLOB SERPL: 1.4 (ref 0.7–1.7)
ALPHA 1: 0.3 g/dL (ref 0.0–0.4)
ALPHA2 GLOB SERPL ELPH-MCNC: 0.7 g/dL (ref 0.4–1.0)
Albumin SerPl Elph-Mcnc: 3.2 g/dL (ref 2.9–4.4)
B-Globulin SerPl Elph-Mcnc: 1 g/dL (ref 0.7–1.3)
Gamma Glob SerPl Elph-Mcnc: 0.3 g/dL — ABNORMAL LOW (ref 0.4–1.8)
Globulin, Total: 2.3 g/dL (ref 2.2–3.9)
IGA/IMMUNOGLOBULIN A, SERUM: 51 mg/dL — AB (ref 87–352)
IGG (IMMUNOGLOBIN G), SERUM: 348 mg/dL — AB (ref 700–1600)
IGM (IMMUNOGLOBIN M), SRM: 13 mg/dL — AB (ref 26–217)
M Protein SerPl Elph-Mcnc: 0.1 g/dL — ABNORMAL HIGH
TOTAL PROTEIN: 5.5 g/dL — AB (ref 6.0–8.5)

## 2016-03-05 LAB — KAPPA/LAMBDA LIGHT CHAINS
IG KAPPA FREE LIGHT CHAIN: 25.95 mg/L — AB (ref 3.30–19.40)
IG LAMBDA FREE LIGHT CHAIN: 6.47 mg/L (ref 5.71–26.30)
Kappa/Lambda FluidC Ratio: 4.01 — ABNORMAL HIGH (ref 0.26–1.65)

## 2016-03-10 ENCOUNTER — Telehealth: Payer: Self-pay | Admitting: Hematology and Oncology

## 2016-03-10 NOTE — Telephone Encounter (Signed)
returned call and s.w pt and confirmed appts....pt ok and aware °

## 2016-03-12 ENCOUNTER — Ambulatory Visit (HOSPITAL_COMMUNITY): Payer: Self-pay | Admitting: Dentistry

## 2016-03-12 ENCOUNTER — Encounter (HOSPITAL_COMMUNITY): Payer: Self-pay | Admitting: Dentistry

## 2016-03-12 VITALS — BP 128/89 | HR 68 | Temp 98.2°F

## 2016-03-12 DIAGNOSIS — K08409 Partial loss of teeth, unspecified cause, unspecified class: Secondary | ICD-10-CM

## 2016-03-12 DIAGNOSIS — R682 Dry mouth, unspecified: Secondary | ICD-10-CM

## 2016-03-12 DIAGNOSIS — K0530 Chronic periodontitis, unspecified: Secondary | ICD-10-CM

## 2016-03-12 DIAGNOSIS — K0889 Other specified disorders of teeth and supporting structures: Secondary | ICD-10-CM

## 2016-03-12 DIAGNOSIS — K045 Chronic apical periodontitis: Secondary | ICD-10-CM

## 2016-03-12 DIAGNOSIS — K029 Dental caries, unspecified: Secondary | ICD-10-CM

## 2016-03-12 DIAGNOSIS — C9000 Multiple myeloma not having achieved remission: Secondary | ICD-10-CM | POA: Diagnosis present

## 2016-03-12 DIAGNOSIS — C9001 Multiple myeloma in remission: Secondary | ICD-10-CM

## 2016-03-12 DIAGNOSIS — K117 Disturbances of salivary secretion: Secondary | ICD-10-CM

## 2016-03-12 DIAGNOSIS — K083 Retained dental root: Secondary | ICD-10-CM

## 2016-03-12 DIAGNOSIS — M264 Malocclusion, unspecified: Secondary | ICD-10-CM

## 2016-03-12 DIAGNOSIS — IMO0002 Reserved for concepts with insufficient information to code with codable children: Secondary | ICD-10-CM

## 2016-03-12 DIAGNOSIS — Z79899 Other long term (current) drug therapy: Secondary | ICD-10-CM | POA: Diagnosis not present

## 2016-03-12 DIAGNOSIS — K036 Deposits [accretions] on teeth: Secondary | ICD-10-CM

## 2016-03-12 MED ORDER — CHLORHEXIDINE GLUCONATE 0.12 % MT SOLN: OROMUCOSAL | Status: DC

## 2016-03-12 NOTE — Progress Notes (Signed)
PERIODIC ORAL EXAMINATION  Date of Examination:  03/12/2016 Patient Name:   AIDAN CALOCA Date of Birth:   [birth year] Medical Record Number: [id number]  VITALS: BP 128/89 mmHg  Pulse 68  Temp(Src) 98.2 F (36.8 C) (Oral)   CHIEF COMPLAINT: Patient referred by Dr. Alvy Bimler for dental examination and re-evaluation of poor dentition.  HPI: MYLINH CRAGG is a 65 year old female with history of multiple myeloma and multiple doses of IV Zometa (24 doses between 03/31/16 and 07/04/2010).  Patient with history of poor dentition. Patient initially seen in March of 2014. Patient was referred for evaluation for extraction by an oral surgeon, but subsequently was referred to an endodontist for Root canal therapy #30 at that time. Patient has been following up with her primary dentist, Dr. Conley Simmonds, since that time with minimal treatment. Patient now returns for periodic oral examination to evaluate dental treatment needs and to assess the risk for osteonecrosis of the jaw with anticipated invasive dental procedures due to previous IV bisphosphonate therapy.   Patient currently denies acute toothache, swellings, or abscesses. Patient was last seen by Dr. Conley Simmonds for dental evaluation in April of 2017. Patient was then to be referred to Dr. Frederik Schmidt for possible dental extractions. A consultation with the oral surgeon was not scheduled and the patient now presents for an examination and possible referral to Dr. Sherwood Gambler at [facility].   PMH: Past Medical History  Diagnosis Date  . Depression   . Hypertension   . Hepatitis C     genotype 1  . Diverticulosis of colon   . Multiple myeloma     kappa light chain, s/p high-dose chemo/stem cell tx - [facility]  . Hx of adenomatous colonic polyps 2007  . Anxiety   . Osteoarthritis   . Sleep apnea   . Hyperlipidemia   . GERD with stricture   . External hemorrhoids   . Hepatitis B virus infection   . IBS (irritable bowel syndrome)    . Helicobacter pylori gastritis 2001    ? if treated  . Sciatic nerve pain     with Dr. Earle Gell  . Chronic low back pain   . Unspecified vitamin D deficiency 09/22/2013  . Allergy   . Fever 11/28/2013  . Dehydration 11/28/2013  . Nausea alone 01/22/2014  . Cough 08/02/2014  . DIVERTICULOSIS, COLON 08/31/2006  . DEPRESSION 08/31/2006    Qualifier: Diagnosis of  By: [name], [name]    . Muscle cramp 11/21/2014  . Chronic fatigue 03/07/2015  . Vitamin D deficiency 05/28/2015  . S/P radiation therapy 06/05/15-06/18/15    C7-T1   . Dysuria 10/08/2015  . Acute intractable headache 11/14/2015  . Oral infection 02/19/2016    PSH: Past Surgical History  Procedure Laterality Date  . Abdominal hysterectomy    . Oophorectomy    . Dilation and curettage of uterus    . Bone marrow transplant      2009, 2013 [facility]  . Appendectomy    . Colonoscopy w/ polypectomy  08/2006    1-2 adenomas, 6 polyps total, diverticulosis and external hemorrhoids  . Upper gastrointestinal endoscopy  08/2003    esophageal stricture dilation, hiatal hernia, gastrritis  . Cholecystectomy    . Tonsillectomy and adenoidectomy    . Femur im nail Right 08/22/2015    Procedure: INTRAMEDULLARY (IM) NAIL FEMORAL;  Surgeon: [name], MD;  Location: [facility];  Service: Orthopedics;  Laterality: Right;    ALLERGIES:  Allergies  Allergen Reactions  . Codeine Itching  . Ibuprofen Other (See Comments)    REACTION: gi trouble  . Albuterol Hives    Pt tolerated albuterol neb without issue 12/2014    MEDICATIONS: Current Outpatient Prescriptions  Medication Sig Dispense Refill  . acyclovir (ZOVIRAX) 400 MG tablet Take 1 tablet (400 mg total) by mouth daily. 30 tablet 6  . ALPRAZolam (XANAX) 0.25 MG tablet Take 1 tablet (0.25 mg total) by mouth at bedtime as needed for anxiety. 60 tablet 0  . aspirin 81 MG tablet Take 81 mg by mouth daily.    . budesonide-formoterol (SYMBICORT) 160-4.5 MCG/ACT inhaler Take 2 puffs  first thing in am and then another 2 puffs about 12 hours later. 1 Inhaler 11  . Cholecalciferol (VITAMIN D3) 5000 UNITS CAPS Take 5,000 Units by mouth daily.     . cyclobenzaprine (FLEXERIL) 10 MG tablet Take 1 tablet (10 mg total) by mouth 3 (three) times daily as needed for muscle spasms. (Patient not taking: Reported on 03/04/2016) 15 tablet 0  . diazepam (VALIUM) 10 MG tablet Take 1 tablet (10 mg total) by mouth every 6 (six) hours as needed (muscle spasm). (Patient not taking: Reported on 03/04/2016) 20 tablet 0  . fluticasone-salmeterol (ADVAIR HFA) 115-21 MCG/ACT inhaler Inhale 2 puffs into the lungs 2 (two) times daily. 1 Inhaler 5  . gabapentin (NEURONTIN) 600 MG tablet Take 1 tablet (600 mg total) by mouth 3 (three) times daily. 90 tablet 3  . ibuprofen (ADVIL,MOTRIN) 600 MG tablet Take 1 tablet (600 mg total) by mouth every 8 (eight) hours as needed. 20 tablet 0  . ipratropium (ATROVENT HFA) 17 MCG/ACT inhaler Inhale 2 puffs into the lungs every 4 (four) hours as needed for wheezing. 1 Inhaler 12  . levothyroxine (SYNTHROID, LEVOTHROID) 50 MCG tablet TAKE 1 TABLET (50 MCG TOTAL) BY MOUTH DAILY BEFORE BREAKFAST. 90 tablet 1  . lidocaine-prilocaine (EMLA) cream Apply 1 application topically as needed. 30 g 6  . losartan (COZAAR) 50 MG tablet Take 1 tablet (50 mg total) by mouth daily. 90 tablet 1  . magic mouthwash SOLN Take 10 mLs by mouth 4 (four) times daily as needed for mouth pain. Swish and spit  Or  Swish and swallow. 480 mL 2  . morphine (MS CONTIN) 100 MG 12 hr tablet Take 90 mg by mouth every 8 (eight) hours.     Marland Kitchen morphine (MSIR) 30 MG tablet Take 1-1.5 tablets (30-45 mg total) by mouth every 2 (two) hours as needed for moderate pain or severe pain. (Patient taking differently: Take 30-45 mg by mouth every 2 (two) hours as needed for moderate pain or severe pain. Take one tablet every 2 hours as needed for moderate pain) 30 tablet 0  . nicotine (NICODERM CQ - DOSED IN MG/24 HOURS) 14  mg/24hr patch Place 1 patch (14 mg total) onto the skin daily. (Patient not taking: Reported on 03/04/2016) 28 patch 0  . oxyCODONE (ROXICODONE) 15 MG immediate release tablet Reported on 03/04/2016    . pomalidomide (POMALYST) 3 MG capsule Take 1 capsule (3 mg total) by mouth daily. Take with water on days 1-21. Repeat every 28 days. 21 capsule 0  . prochlorperazine (COMPAZINE) 10 MG tablet Take 10 mg by mouth every 6 (six) hours as needed. for nausea  3  . senna-docusate (SENOKOT-S) 8.6-50 MG tablet Take 1 tablet by mouth daily. (Patient not taking: Reported on 03/04/2016)    . SPIRIVA HANDIHALER 18 MCG inhalation capsule  Take 2 puffs by mouth 4 (four) times daily.    . [DISCONTINUED] amLODipine (NORVASC) 5 MG tablet Take 1 tablet (5 mg total) by mouth daily. 90 tablet 3   No current facility-administered medications for this visit.   Facility-Administered Medications Ordered in Other Visits  Medication Dose Route Frequency Provider Last Rate Last Dose  . sodium chloride 0.9 % injection 10 mL  10 mL Intravenous PRN Heath Lark, MD   10 mL at 02/19/16 0911    LABS: Lab Results  Component Value Date   WBC 5.3 03/04/2016   HGB 12.6 03/04/2016   HCT 39.3 03/04/2016   MCV 92.7 03/04/2016   PLT 207 03/04/2016      Component Value Date/Time   NA 140 03/04/2016 0949   NA 141 09/11/2015 2255   K 3.9 03/04/2016 0949   K 4.3 09/11/2015 2255   CL 107 09/11/2015 2255   CL 107 03/10/2013 1014   CO2 21* 03/04/2016 0949   CO2 27 09/11/2015 2255   GLUCOSE 111 03/04/2016 0949   GLUCOSE 106* 09/11/2015 2255   GLUCOSE 111* 03/10/2013 1014   GLUCOSE 98 08/31/2006 1024   BUN 8.2 03/04/2016 0949   BUN 16 09/11/2015 2255   CREATININE 0.7 03/04/2016 0949   CREATININE 0.72 09/12/2015 0620   CALCIUM 9.0 03/04/2016 0949   CALCIUM 9.1 09/11/2015 2255   GFRNONAA >60 09/12/2015 0620   GFRAA >60 09/12/2015 0620   Lab Results  Component Value Date   INR 1.01 10/31/2013   INR 1.0 03/12/2008   INR 0.9  RATIO 01/05/2008   No results found for: PTT  SOCIAL HISTORY: Social History   Social History  . Marital Status: Married    Spouse Name: Ammanda Dobbins.  . Number of Children: 2  . Years of Education: 12   Occupational History  . Security    Social History Main Topics  . Smoking status: Current Every Day Smoker -- 0.25 packs/day for 44 years    Types: Cigarettes  . Smokeless tobacco: Never Used  . Alcohol Use: No  . Drug Use: No  . Sexual Activity: No   Other Topics Concern  . Not on file   Social History Narrative   Lives with husband and grandson.     Ambulates with a walker at baseline.   Caffeine use: Coffee: 1-2 cup/day    FAMILY HISTORY: Family History  Problem Relation Age of Onset  . Alcohol abuse    . Lung cancer Father     smoked  . Leukemia Mother   . Cirrhosis Sister   . Colon cancer Neg Hx   . Neuropathy Neg Hx   . Lung cancer Sister     smoked    DENTAL HISTORY: CHIEF COMPLAINT: Patient referred by Dr. Alvy Bimler for dental examination and re-evaluation of poor dentition.  HPI: COZETTE BRAGGS is a 65 year old female with history of multiple myeloma and multiple doses of IV Zometa (24 doses between 03/31/16 and 07/04/2010).  Patient with history of poor dentition. Patient initially seen in March of 2014. Patient was referred for evaluation for extraction by an oral surgeon, but subsequently was referred to an endodontist for Root canal therapy #30 at that time. Patient has been following up with her primary dentist, Dr. Conley Simmonds, since that time with minimal treatment. Patient now returns for periodic oral examination to evaluate dental treatment needs and to assess the risk for osteonecrosis of the jaw with anticipated invasive dental procedures due to previous  IV bisphosphonate therapy.   Patient currently denies acute toothache, swellings, or abscesses. Patient was last seen by Dr. Conley Simmonds for dental evaluation in April of 2017. Patient was then to  be referred to Dr. Frederik Schmidt for possible dental extractions. A consultation with the oral surgeon was not scheduled and the patient now presents for an examination and possible referral to Dr. Sherwood Gambler at [facility].   DENTAL EXAMINATION:  GENERAL: Patient is a well-developed, well-nourished female in no acute distress. HEAD AND NECK: There is no submandibular lymphadenopathy. The patient denies acute TMJ symptoms but has bilateral TMJ crepitus. INTRAORAL EXAM: Patient has xerostomia. I do not see any evidence of oral abscess formation. DENTITION: There are multiple missing teeth numbers 1, 11, 13, 16, 18, 19, 29, and 32. There are retained root segments in the area of tooth numbers 2, 3, 4, 5, 10, 14, 15, 17, 22, 23, 27, 30 . PERIODONTAL: The patient has chronic periodontitis with plaque and calculus accumulations, selective areas of gingival recession and tooth mobility. DENTAL CARIES/SUBOPTIMAL RESTORATIONS: Rampant dental caries are noted as per dental charting form. ENDODONTIC: The patient has previous root canal therapy associated with the retained roots of tooth #30 and tooth #7. The root canal therapy of tooth #30 is now exposed to the oral environment. Patient also has periapical pathology and radiolucency associated with tooth numbers 2, 3, 4, 5, 10, 14, 15, 17. CROWN AND BRIDGE: The patient has a crown on tooth #7. There are recurrent caries on the mesiolingual. PROSTHODONTIC: The patient denies presence of partial dentures. OCCLUSION: The patient has a poor occlusal scheme secondary to multiple missing teeth, multiple retained root segments, supra-eruption and drifting of the unopposed teeth into the edentulous areas, and lack of replacement of missing teeth with dental prostheses.  RADIOGRAPHIC INTERPRETATION: An orthopantogram was obtained. There are multiple missing teeth. There are multiple retained root segments. There are multiple dental caries noted. There is  periapical pathology associated with the apices of tooth numbers 2, 3, 4, 5, 10, 14, 15, 17.  There is supra-eruption and drifting of the unopposed teeth into the edentulous areas. There is incipient to moderate bone loss.   ASSESSMENTS: 1. Multiple myeloma 2. History of 24 doses of IV Zometa from 03/31/2006 through 07/04/2010.  3. Chronic apical periodontitis 4. Rampant dental caries 5. Multiple retained root segments 6. Chronic periodontitis with bone loss 7. Plaque and calculus accumulations 8. Gingival recession 9. Tooth mobility 10. Multiple missing teeth 11. Xerostomia 12. Bilateral TMJ crepitus but no acute TMJ symptoms. 13. History of 24 doses of IV Zometa with the risk for osteonecrosis of the jaw with invasive dental procedures  PLAN/RECOMMENDATIONS: 1. I discussed the risks, benefits, and complications of various treatment options with the patient in relationship to her medical and dental conditions and potential risk for osteonecrosis of the jaw related to previous IV bisphosphonate therapy with anticipated invasive dental procedures. We discussed various treatment options to include no treatment, multiple extractions with alveoloplasty, pre-prosthetic surgery as indicated, periodontal therapy, dental restorations, root canal therapy, crown and bridge therapy, implant therapy, and replacement of missing teeth as indicated. The patient currently expresses understanding about potential risk for osteonecrosis of the jaw with dental extraction procedures. Due to the complexity of the dental treatment, the patient will be referred to [facility] for evaluation with Dr. Sherwood Gambler.  The patient will also be prescribed chlorhexidine rinses to use on a twice a day basis after breakfast  and at bedtime in a swish and spit manner.  2. Discussion of findings with medical team and dental team members and coordination of future medical and dental care as indicated.   Lenn Cal, DDS

## 2016-03-12 NOTE — Patient Instructions (Signed)
Patient is being referred to Dr. Sherwood Gambler at [facility]. Patient is to inform dental medicine when she is scheduled for an appointment with Dr. Patrina Levering. Dr. Enrique Sack

## 2016-03-17 ENCOUNTER — Other Ambulatory Visit: Payer: Self-pay | Admitting: *Deleted

## 2016-03-17 DIAGNOSIS — D696 Thrombocytopenia, unspecified: Secondary | ICD-10-CM

## 2016-03-17 DIAGNOSIS — C9001 Multiple myeloma in remission: Secondary | ICD-10-CM

## 2016-03-17 DIAGNOSIS — D649 Anemia, unspecified: Secondary | ICD-10-CM

## 2016-03-17 MED ORDER — POMALIDOMIDE 3 MG PO CAPS: 3.0000 mg | ORAL_CAPSULE | Freq: Every day | ORAL | Status: DC

## 2016-03-19 ENCOUNTER — Other Ambulatory Visit: Payer: Self-pay | Admitting: Internal Medicine

## 2016-03-19 DIAGNOSIS — R0600 Dyspnea, unspecified: Secondary | ICD-10-CM

## 2016-03-20 ENCOUNTER — Encounter: Payer: Self-pay | Admitting: Hematology and Oncology

## 2016-03-20 ENCOUNTER — Ambulatory Visit: Payer: Self-pay | Admitting: Internal Medicine

## 2016-03-20 ENCOUNTER — Other Ambulatory Visit: Payer: Self-pay | Admitting: Hematology and Oncology

## 2016-03-20 NOTE — Progress Notes (Signed)
Per [facility] pomalyst shipped via [facility] 03/19/16

## 2016-03-25 DIAGNOSIS — G894 Chronic pain syndrome: Secondary | ICD-10-CM | POA: Diagnosis not present

## 2016-03-25 DIAGNOSIS — Z79891 Long term (current) use of opiate analgesic: Secondary | ICD-10-CM | POA: Diagnosis not present

## 2016-03-25 DIAGNOSIS — M47812 Spondylosis without myelopathy or radiculopathy, cervical region: Secondary | ICD-10-CM | POA: Diagnosis not present

## 2016-03-25 DIAGNOSIS — M5481 Occipital neuralgia: Secondary | ICD-10-CM | POA: Diagnosis not present

## 2016-04-03 ENCOUNTER — Telehealth: Payer: Self-pay | Admitting: *Deleted

## 2016-04-03 ENCOUNTER — Telehealth: Payer: Self-pay | Admitting: Hematology and Oncology

## 2016-04-03 NOTE — Telephone Encounter (Signed)
If that's the case, tell her to stay on Pomalyst and ask if she is OK for [place] to move her appt next week to 7/12?  If so, please put new POF. Thanks

## 2016-04-03 NOTE — Telephone Encounter (Signed)
spoke w/ pt confirmed 7/12 apt

## 2016-04-03 NOTE — Telephone Encounter (Signed)
Pt states she is scheduled to go to [facility] on 7/5 for evaluation

## 2016-04-03 NOTE — Telephone Encounter (Signed)
Pt instructed to stay on Pomalyst and is OK with moving appt to 7/12.

## 2016-04-03 NOTE — Telephone Encounter (Signed)
-----   Message from Heath Lark, MD sent at 04/03/2016  7:26 AM EDT ----- Regarding: dental clearance Can you call her and see if she has been to get dental work done yet? I know she saw Dr. Enrique Sack and he referred her back to [facility]

## 2016-04-08 ENCOUNTER — Other Ambulatory Visit: Payer: Self-pay

## 2016-04-08 ENCOUNTER — Ambulatory Visit: Payer: Self-pay | Admitting: Hematology and Oncology

## 2016-04-14 ENCOUNTER — Telehealth: Payer: Self-pay | Admitting: *Deleted

## 2016-04-14 ENCOUNTER — Other Ambulatory Visit: Payer: Self-pay | Admitting: *Deleted

## 2016-04-14 DIAGNOSIS — C9001 Multiple myeloma in remission: Secondary | ICD-10-CM

## 2016-04-14 DIAGNOSIS — D649 Anemia, unspecified: Secondary | ICD-10-CM

## 2016-04-14 DIAGNOSIS — D696 Thrombocytopenia, unspecified: Secondary | ICD-10-CM

## 2016-04-14 MED ORDER — POMALIDOMIDE 3 MG PO CAPS: 3.0000 mg | ORAL_CAPSULE | Freq: Every day | ORAL | Status: DC

## 2016-04-14 MED ORDER — POMALIDOMIDE 3 MG PO CAPS
3.0000 mg | ORAL_CAPSULE | Freq: Every day | ORAL | Status: DC
Start: 2016-04-14 — End: 2016-04-14

## 2016-04-14 NOTE — Telephone Encounter (Signed)
Pt called to notify nurse that Pomalyst Rx was sent to [facility].  Apologized to pt I accidentally sent [facility] to [facility] and now will [facility] it [facility].  [facility] says [facility] knows not to fill it and they do not have that medication in any case.

## 2016-04-22 DIAGNOSIS — M47812 Spondylosis without myelopathy or radiculopathy, cervical region: Secondary | ICD-10-CM | POA: Diagnosis not present

## 2016-04-22 DIAGNOSIS — M5481 Occipital neuralgia: Secondary | ICD-10-CM | POA: Diagnosis not present

## 2016-04-22 DIAGNOSIS — Z79891 Long term (current) use of opiate analgesic: Secondary | ICD-10-CM | POA: Diagnosis not present

## 2016-04-22 DIAGNOSIS — G894 Chronic pain syndrome: Secondary | ICD-10-CM | POA: Diagnosis not present

## 2016-04-30 ENCOUNTER — Telehealth: Payer: Self-pay

## 2016-04-30 NOTE — Telephone Encounter (Signed)
Dr Ahern- FYI 

## 2016-04-30 NOTE — Telephone Encounter (Signed)
Received a 90 day prescription refill request for gabapentin 300mg  TID. It appears that the RX has been changed to gapapentin 600 mg TID however on 02/13/2016.  I called the [facility] pharmacy, spoke to [place]. He reports to me that the 600mg  RX was "filled but put back". He has no notes on why it was put back. Pt came on 02/20/16 and picked up the gabapentin 300mg  TID RX. The last time the gabapentin 300mg  TID RX was picked up was on 04/18/16.  I called the pt. She says that when she [facility] to [facility] the 600mg  RX, she was told they didn't have the 600 mg and she would have to get the 300mg . I advised her that I was told that 600mg  had actually been filled for her, but then put back. Pt states that she does want the 600 mg RX.  I called Jim at [facility] back and advised him that pt would like the gabapentin 600mg  filled as prescribed. Clair Gulling ran [employer], it went through for $3.90, and will be ready for the pt when she is able to get it.

## 2016-04-30 NOTE — Telephone Encounter (Signed)
FYI

## 2016-05-08 ENCOUNTER — Other Ambulatory Visit: Payer: Self-pay | Admitting: Hematology and Oncology

## 2016-05-08 ENCOUNTER — Telehealth: Payer: Self-pay | Admitting: *Deleted

## 2016-05-08 DIAGNOSIS — C9001 Multiple myeloma in remission: Secondary | ICD-10-CM

## 2016-05-08 NOTE — Telephone Encounter (Signed)
Pt says she saw oral surgeon on 7/5 is waiting to hear back from them. Will need to have all teeth removed

## 2016-05-08 NOTE — Telephone Encounter (Signed)
-----   Message from Heath Lark, MD sent at 05/08/2016 11:37 AM EDT ----- Regarding: dental issue Any luck of getting her dental issues resolve yet?

## 2016-05-13 ENCOUNTER — Ambulatory Visit (HOSPITAL_COMMUNITY)
Admission: RE | Admit: 2016-05-13 | Discharge: 2016-05-13 | Disposition: A | Discharge status: Home / Self Care | Payer: Medicare Other | Source: Ambulatory Visit | Attending: Hematology and Oncology | Admitting: Hematology and Oncology

## 2016-05-13 ENCOUNTER — Telehealth: Payer: Self-pay | Admitting: *Deleted

## 2016-05-13 ENCOUNTER — Ambulatory Visit (HOSPITAL_BASED_OUTPATIENT_CLINIC_OR_DEPARTMENT_OTHER): Payer: Medicare Other | Admitting: Hematology and Oncology

## 2016-05-13 ENCOUNTER — Encounter: Payer: Self-pay | Admitting: Hematology and Oncology

## 2016-05-13 ENCOUNTER — Other Ambulatory Visit (HOSPITAL_BASED_OUTPATIENT_CLINIC_OR_DEPARTMENT_OTHER): Payer: Medicare Other

## 2016-05-13 ENCOUNTER — Telehealth: Payer: Self-pay | Admitting: Hematology and Oncology

## 2016-05-13 VITALS — BP 163/89 | HR 58 | Temp 98.4°F | Resp 18 | Wt 144.6 lb

## 2016-05-13 DIAGNOSIS — R938 Abnormal findings on diagnostic imaging of other specified body structures: Secondary | ICD-10-CM | POA: Diagnosis not present

## 2016-05-13 DIAGNOSIS — M79651 Pain in right thigh: Secondary | ICD-10-CM | POA: Diagnosis not present

## 2016-05-13 DIAGNOSIS — M79604 Pain in right leg: Secondary | ICD-10-CM | POA: Diagnosis not present

## 2016-05-13 DIAGNOSIS — D649 Anemia, unspecified: Secondary | ICD-10-CM

## 2016-05-13 DIAGNOSIS — D702 Other drug-induced agranulocytosis: Secondary | ICD-10-CM | POA: Diagnosis not present

## 2016-05-13 DIAGNOSIS — Z720 Tobacco use: Secondary | ICD-10-CM | POA: Diagnosis not present

## 2016-05-13 DIAGNOSIS — D696 Thrombocytopenia, unspecified: Secondary | ICD-10-CM

## 2016-05-13 DIAGNOSIS — C9002 Multiple myeloma in relapse: Secondary | ICD-10-CM

## 2016-05-13 DIAGNOSIS — Z9889 Other specified postprocedural states: Secondary | ICD-10-CM | POA: Diagnosis not present

## 2016-05-13 DIAGNOSIS — C9001 Multiple myeloma in remission: Secondary | ICD-10-CM | POA: Insufficient documentation

## 2016-05-13 DIAGNOSIS — M79605 Pain in left leg: Secondary | ICD-10-CM | POA: Diagnosis not present

## 2016-05-13 DIAGNOSIS — K089 Disorder of teeth and supporting structures, unspecified: Secondary | ICD-10-CM

## 2016-05-13 DIAGNOSIS — M8580 Other specified disorders of bone density and structure, unspecified site: Secondary | ICD-10-CM | POA: Diagnosis not present

## 2016-05-13 DIAGNOSIS — M79652 Pain in left thigh: Secondary | ICD-10-CM | POA: Diagnosis not present

## 2016-05-13 DIAGNOSIS — F17210 Nicotine dependence, cigarettes, uncomplicated: Secondary | ICD-10-CM

## 2016-05-13 LAB — COMPREHENSIVE METABOLIC PANEL
ALBUMIN: 3.6 g/dL (ref 3.5–5.0)
ALK PHOS: 105 U/L (ref 40–150)
ALT: 18 U/L (ref 0–55)
AST: 21 U/L (ref 5–34)
Anion Gap: 9 mEq/L (ref 3–11)
BILIRUBIN TOTAL: 0.5 mg/dL (ref 0.20–1.20)
BUN: 7.8 mg/dL (ref 7.0–26.0)
CO2: 23 meq/L (ref 22–29)
Calcium: 9.2 mg/dL (ref 8.4–10.4)
Chloride: 108 mEq/L (ref 98–109)
Creatinine: 0.7 mg/dL (ref 0.6–1.1)
EGFR: >90 mL/min/{1.73_m2} (ref >90)
GLUCOSE: 103 mg/dL (ref 70–140)
Potassium: 4 mEq/L (ref 3.5–5.1)
Sodium: 140 mEq/L (ref 136–145)
TOTAL PROTEIN: 6.2 g/dL — AB (ref 6.4–8.3)

## 2016-05-13 LAB — CBC WITH DIFFERENTIAL/PLATELET
BASO%: 2.2 % — ABNORMAL HIGH (ref 0.0–2.0)
Basophils Absolute: 0.1 10*3/uL (ref 0.0–0.1)
EOS ABS: 0.3 10*3/uL (ref 0.0–0.5)
EOS%: 10.5 % — ABNORMAL HIGH (ref 0.0–7.0)
HCT: 40.3 % (ref 34.8–46.6)
HEMOGLOBIN: 13.3 g/dL (ref 11.6–15.9)
LYMPH%: 25.9 % (ref 14.0–49.7)
MCH: 28.9 pg (ref 25.1–34.0)
MCHC: 32.9 g/dL (ref 31.5–36.0)
MCV: 87.9 fL (ref 79.5–101.0)
MONO#: 0.7 10*3/uL (ref 0.1–0.9)
MONO%: 22.4 % — ABNORMAL HIGH (ref 0.0–14.0)
NEUT%: 39 % (ref 38.4–76.8)
NEUTROS ABS: 1.1 10*3/uL — AB (ref 1.5–6.5)
PLATELETS: 187 10*3/uL (ref 145–400)
RBC: 4.58 10*6/uL (ref 3.70–5.45)
RDW: 18.6 % — AB (ref 11.2–14.5)
WBC: 2.9 10*3/uL — AB (ref 3.9–10.3)
lymph#: 0.8 10*3/uL — ABNORMAL LOW (ref 0.9–3.3)

## 2016-05-13 MED ORDER — HEPARIN SOD (PORK) LOCK FLUSH 100 UNIT/ML IV SOLN
500.0000 [IU] | Freq: Once | INTRAVENOUS | Status: AC | PRN
  Administered 2016-05-13: 500 [IU] via INTRAVENOUS
  Filled 2016-05-13: qty 5

## 2016-05-13 MED ORDER — SODIUM CHLORIDE 0.9 % IJ SOLN
10.0000 mL | INTRAMUSCULAR | Status: DC | PRN
  Administered 2016-05-13: 10 mL via INTRAVENOUS
  Filled 2016-05-13: qty 10

## 2016-05-13 MED ORDER — POMALIDOMIDE 3 MG PO CAPS: 3.0000 mg | ORAL_CAPSULE | Freq: Every day | ORAL | Status: DC

## 2016-05-13 NOTE — Progress Notes (Signed)
[place] OFFICE PROGRESS NOTE  Patient Care Team: Marin Olp, MD as PCP - General (Family Medicine) Jeanann Lewandowsky, MD as Consulting Physician (Medical Oncology) Marin Olp, MD as Consulting Physician (Family Medicine) Heath Lark, MD as Consulting Physician (Hematology and Oncology) [name], MD as Consulting Physician (Orthopedic Surgery)  SUMMARY OF ONCOLOGIC HISTORY: Oncology History   Multiple myeloma, kappa light chain disease, Durie-Salmon stage III       Multiple myeloma in remission ([place])   07/16/2006 Bone Marrow Biopsy BM biopsy is non-diagnostic   09/21/2006 Procedure L5 vertebral biopsy 5% plasma cell   10/25/2006 Bone Marrow Biopsy BM biopsy is hypercellular with 5% plasma cell   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   01/03/2007 - 01/10/2007 Radiation Therapy Approximate date only, received RT for plasmacytoma followed by surgery   09/14/2007 Initial Diagnosis MULTIPLE  MYELOMA   10/05/2007 Bone Marrow Biopsy Bm biopsy was negative   06/18/2008 Bone Marrow Biopsy BM biopsy was negative   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 [facility]   04/13/2011 Relapse/Recurrence Disease relapse   04/14/2011 Bone Marrow Biopsy Bm biopsy showed 2 % plasma cell   04/27/2011 Relapse/Recurrence Disease relapse, treated with Velcade/Cytoxan/Dex   09/07/2011 - 07/28/2013 Chemotherapy She has been receiving Velcade   12/27/2011 Bone Marrow Transplant 2nd transplant at [facility]   07/28/2013 Relapse/Recurrence Chemo is stopped due to progression of disease   08/29/2013 Imaging PEt/CT showed recurrence of disease with new lesion on her rib with compression fracture   09/13/2013 Bone Marrow Biopsy BM biopsy is hypercellular with 6% plasma cell   10/10/2013 - 10/20/2013 Radiation Therapy Started on palliative XRT for rib pain   11/27/2013 - 04/06/2014 Chemotherapy The patient starts chemotherapy with Carfilzomib   07/19/2014 Imaging Repeat  bloodwork and PET/CT scan shows significant disease progression.   08/02/2014 - 03/06/2015 Chemotherapy She enrolled in clinical trial using combination therapy with Revlimid, dexamethasone and Elotuzumab   03/07/2015 - 06/04/2015 Chemotherapy She is on maintenance Revlimid only without dexamethasone   05/27/2015 Imaging  MRI spine showed new compression fracture.   06/04/2015 - 06/18/2015 Radiation Therapy  she received palliative radiation therapy to 25 Gy C7-T1   07/10/2015 - 08/07/2015 Chemotherapy She received Elotuzumab, dex and Revlimid. Rx is stopped due to progression   08/01/2015 Imaging MRI of the spine was performed today due to new onset of worsening right hip pain/flank area. MRI show mild progression of the L1 vertebral body.   08/21/2015 - 08/26/2015 Hospital Admission She was admitted to the hospital due to pathologic fracture to right femur   08/22/2015 Surgery She had IM nail to fractured femur   08/23/2015 - 09/20/2015 Radiation Therapy She received XRT to her L1 L2 and Right Femur   09/05/2015 - 09/06/2015 Hospital Admission She had recurrent admission due to displaced fracture, managed conservatively   09/12/2015 - 09/17/2015 Hospital Admission She had recurrent admission for COPD exacerbation   09/30/2015 -  Chemotherapy She was started on Pomalyst   12/10/2015 -  Chemotherapy She started weekly Daratumumab along with Pomalyst    INTERVAL HISTORY: Please see below for problem oriented charting. She returns for further follow-up. She is still waiting for dental extraction. She continues to smoke. She complained of severe leg pain bilaterally. She denies new neurological deficit. She denies recent infection. REVIEW OF SYSTEMS:   Constitutional: Denies fevers, chills or abnormal weight loss Eyes: Denies blurriness of vision Ears, nose, mouth, throat, and face: Denies mucositis or sore  throat Respiratory: Denies cough, dyspnea or wheezes Cardiovascular: Denies palpitation, chest  discomfort or lower extremity swelling Gastrointestinal:  Denies nausea, heartburn or change in bowel habits Skin: Denies abnormal skin rashes Lymphatics: Denies new lymphadenopathy or easy bruising Neurological:Denies numbness, tingling or new weaknesses Behavioral/Psych: Mood is stable, no new changes  All other systems were reviewed with the patient and are negative.  I have reviewed the past medical history, past surgical history, social history and family history with the patient and they are unchanged from previous note.  ALLERGIES:  is allergic to codeine; ibuprofen; and albuterol.  MEDICATIONS:  Current Outpatient Prescriptions  Medication Sig Dispense Refill  . acyclovir (ZOVIRAX) 400 MG tablet Take 1 tablet (400 mg total) by mouth daily. 30 tablet 6  . ALPRAZolam (XANAX) 0.25 MG tablet Take 1 tablet (0.25 mg total) by mouth at bedtime as needed for anxiety. 60 tablet 0  . aspirin 81 MG tablet Take 81 mg by mouth daily.    . budesonide-formoterol (SYMBICORT) 160-4.5 MCG/ACT inhaler Take 2 puffs first thing in am and then another 2 puffs about 12 hours later. 1 Inhaler 11  . chlorhexidine (PERIDEX) 0.12 % solution Rinse with 15 mls twice daily for 30 seconds. Use after breakfast and at bedtime. Spit out excess. Do not swallow. 480 mL prn  . cyclobenzaprine (FLEXERIL) 10 MG tablet Take 1 tablet (10 mg total) by mouth 3 (three) times daily as needed for muscle spasms. 15 tablet 0  . fluticasone-salmeterol (ADVAIR HFA) 115-21 MCG/ACT inhaler Inhale 2 puffs into the lungs 2 (two) times daily. 1 Inhaler 5  . gabapentin (NEURONTIN) 600 MG tablet Take 1 tablet (600 mg total) by mouth 3 (three) times daily. 90 tablet 3  . ibuprofen (ADVIL,MOTRIN) 600 MG tablet Take 1 tablet (600 mg total) by mouth every 8 (eight) hours as needed. 20 tablet 0  . ipratropium (ATROVENT HFA) 17 MCG/ACT inhaler Inhale 2 puffs into the lungs every 4 (four) hours as needed for wheezing. 1 Inhaler 12  .  levothyroxine (SYNTHROID, LEVOTHROID) 50 MCG tablet TAKE 1 TABLET (50 MCG TOTAL) BY MOUTH DAILY BEFORE BREAKFAST. 90 tablet 1  . lidocaine-prilocaine (EMLA) cream Apply 1 application topically as needed. 30 g 6  . losartan (COZAAR) 50 MG tablet Take 1 tablet (50 mg total) by mouth daily. 90 tablet 1  . morphine (MS CONTIN) 100 MG 12 hr tablet Take 100 mg by mouth every 8 (eight) hours.     Marland Kitchen morphine (MSIR) 30 MG tablet Take 1-1.5 tablets (30-45 mg total) by mouth every 2 (two) hours as needed for moderate pain or severe pain. (Patient taking differently: Take 30-45 mg by mouth every 2 (two) hours as needed for moderate pain or severe pain. Take one tablet every 2 hours as needed for moderate pain) 30 tablet 0  . nicotine (NICODERM CQ - DOSED IN MG/24 HOURS) 14 mg/24hr patch Place 1 patch (14 mg total) onto the skin daily. 28 patch 0  . pomalidomide (POMALYST) 3 MG capsule Take 1 capsule (3 mg total) by mouth daily. Take with water on days 1-21. Repeat every 28 days. 21 capsule 0  . prochlorperazine (COMPAZINE) 10 MG tablet Take 10 mg by mouth every 6 (six) hours as needed. for nausea  3  . senna-docusate (SENOKOT-S) 8.6-50 MG tablet Take 1 tablet by mouth daily.    Marland Kitchen SPIRIVA HANDIHALER 18 MCG inhalation capsule Take 2 puffs by mouth 4 (four) times daily.    . Vitamin D, Ergocalciferol, (DRISDOL)  50000 units CAPS capsule TAKE 1 CAPSULE (50,000 UNITS TOTAL) BY MOUTH ONCE A WEEK. 12 capsule 3  . [DISCONTINUED] amLODipine (NORVASC) 5 MG tablet Take 1 tablet (5 mg total) by mouth daily. 90 tablet 3   Current Facility-Administered Medications  Medication Dose Route Frequency Provider Last Rate Last Dose  . sodium chloride 0.9 % injection 10 mL  10 mL Intravenous PRN Artis Delay, MD   10 mL at 05/13/16 0955   Facility-Administered Medications Ordered in Other Visits  Medication Dose Route Frequency Provider Last Rate Last Dose  . sodium chloride 0.9 % injection 10 mL  10 mL Intravenous PRN Artis Delay, MD    10 mL at 02/19/16 0911    PHYSICAL EXAMINATION: ECOG PERFORMANCE STATUS: 1 - Symptomatic but completely ambulatory  Filed Vitals:   05/13/16 0925  BP: 163/89  Pulse: 58  Temp: 98.4 F (36.9 C)  Resp: 18   Filed Weights   05/13/16 0925  Weight: 144 lb 9.6 oz (65.59 kg)    GENERAL:alert, no distress and comfortable SKIN: skin color, texture, turgor are normal, no rashes or significant lesions EYES: normal, Conjunctiva are pink and non-injected, sclera clear OROPHARYNX:no exudate, no erythema and lips, buccal mucosa, and tongue normal . She has extremely poor dentition NECK: supple, thyroid normal size, non-tender, without nodularity LYMPH:  no palpable lymphadenopathy in the cervical, axillary or inguinal LUNGS: clear to auscultation and percussion with normal breathing effort HEART: regular rate & rhythm and no murmurs with mild bilateral lower extremity edema ABDOMEN:abdomen soft, non-tender and normal bowel sounds Musculoskeletal:no cyanosis of digits and no clubbing  NEURO: alert & oriented x 3 with fluent speech, no focal motor/sensory deficits  LABORATORY DATA:  I have reviewed the data as listed    Component Value Date/Time   NA 140 05/13/2016 0911   NA 141 09/11/2015 2255   K 4.0 05/13/2016 0911   K 4.3 09/11/2015 2255   CL 107 09/11/2015 2255   CL 107 03/10/2013 1014   CO2 23 05/13/2016 0911   CO2 27 09/11/2015 2255   GLUCOSE 103 05/13/2016 0911   GLUCOSE 106* 09/11/2015 2255   GLUCOSE 111* 03/10/2013 1014   GLUCOSE 98 08/31/2006 1024   BUN 7.8 05/13/2016 0911   BUN 16 09/11/2015 2255   CREATININE 0.7 05/13/2016 0911   CREATININE 0.72 09/12/2015 0620   CALCIUM 9.2 05/13/2016 0911   CALCIUM 9.1 09/11/2015 2255   PROT 6.2* 05/13/2016 0911   PROT 5.5* 03/04/2016 0949   PROT 6.6 09/11/2014 0336   ALBUMIN 3.6 05/13/2016 0911   ALBUMIN 3.4* 09/11/2014 0336   AST 21 05/13/2016 0911   AST 18 09/11/2014 0336   ALT 18 05/13/2016 0911   ALT 18 09/11/2014  0336   ALKPHOS 105 05/13/2016 0911   ALKPHOS 113 09/11/2014 0336   BILITOT 0.50 05/13/2016 0911   BILITOT 0.6 09/11/2014 0336   GFRNONAA >60 09/12/2015 0620   GFRAA >60 09/12/2015 0620    No results found for: SPEP, UPEP  Lab Results  Component Value Date   WBC 2.9* 05/13/2016   NEUTROABS 1.1* 05/13/2016   HGB 13.3 05/13/2016   HCT 40.3 05/13/2016   MCV 87.9 05/13/2016   PLT 187 05/13/2016      Chemistry      Component Value Date/Time   NA 140 05/13/2016 0911   NA 141 09/11/2015 2255   K 4.0 05/13/2016 0911   K 4.3 09/11/2015 2255   CL 107 09/11/2015 2255   CL 107 03/10/2013  1014   CO2 23 05/13/2016 0911   CO2 27 09/11/2015 2255   BUN 7.8 05/13/2016 0911   BUN 16 09/11/2015 2255   CREATININE 0.7 05/13/2016 0911   CREATININE 0.72 09/12/2015 0620      Component Value Date/Time   CALCIUM 9.2 05/13/2016 0911   CALCIUM 9.1 09/11/2015 2255   ALKPHOS 105 05/13/2016 0911   ALKPHOS 113 09/11/2014 0336   AST 21 05/13/2016 0911   AST 18 09/11/2014 0336   ALT 18 05/13/2016 0911   ALT 18 09/11/2014 0336   BILITOT 0.50 05/13/2016 0911   BILITOT 0.6 09/11/2014 0336       RADIOGRAPHIC STUDIES: I have personally reviewed the radiological images as listed and agreed with the findings in the report. Dg Tibia/fibula Left  05/13/2016  CLINICAL DATA:  Multiple myeloma in remission, BILATERAL lower extremity pain, arthroscopic LEFT knee surgery 3 years ago EXAM: LEFT TIBIA AND FIBULA - 2 VIEW COMPARISON:  None FINDINGS: Diffuse osseous demineralization. Joint spaces preserved. No acute fracture, dislocation or bone destruction. Patellar spurs at patellar tendon and quadriceps tendon insertions. Specifically no discrete lytic lesions identified to suggest multiple myeloma. IMPRESSION: Osseous demineralization without acute bony abnormality. Electronically Signed   By: Lavonia Dana M.D.   On: 05/13/2016 11:20   Dg Tibia/fibula Right  05/13/2016  CLINICAL DATA:  Multiple myeloma in  remission, BILATERAL lower extremity pain EXAM: RIGHT TIBIA AND FIBULA - 2 VIEW COMPARISON:  RIGHT knee radiographs 01/24/2006 FINDINGS: Osseous demineralization. Joint spaces preserved. IM nail noted at distal femoral metaphysis. Small patellar spur at quadriceps tendon insertion. No acute fracture, dislocation, or bone destruction. Specifically no lytic bone lesions seen to suggest myeloma. Small bone island at calcaneus. IMPRESSION: Osseous demineralization. No acute abnormalities. Electronically Signed   By: Lavonia Dana M.D.   On: 05/13/2016 11:22   Dg Femur Min 2 Views Left  05/13/2016  CLINICAL DATA:  Multiple myeloma in remission, BILATERAL lower extremity pain EXAM: LEFT FEMUR 2 VIEWS COMPARISON:  None FINDINGS: Diffuse osseous demineralization. Joint spaces preserved. No acute fracture, dislocation, or bone destruction. Small patellar spurs and tendon insertions. IMPRESSION: Osseous demineralization without acute bony abnormality. Electronically Signed   By: Lavonia Dana M.D.   On: 05/13/2016 11:22   Dg Femur, Min 2 Views Right  05/13/2016  CLINICAL DATA:  BILATERAL lower extremity pain, multiple myeloma in remission, history of RIGHT femoral fracture post surgery EXAM: RIGHT FEMUR 2 VIEWS COMPARISON:  09/05/2015 FINDINGS: Fat IM nail with compression screw in RIGHT femur post ORIF of a subtrochanteric fracture. A portion of the medial fracture line remains evident though significant callus is present since the previous exam. Displaced fracture of the distal locking screw. Focal bony lucency in distal femur at the level of the screw fragment, question related to prior surgery. Diffuse osseous demineralization otherwise seen. Small focus of endosteal scalloping identified at the proximal femoral diaphysis, but similar to an earlier study of 08/14/2015, cannot completely exclude myeloma. No definite additional lytic foci seen. A question lytic focus at the distal femoral diaphysis on an earlier study of  08/14/2015 is no longer identified though this could be obscured by the orthopedic hardware. IMPRESSION: Chronic endosteal scalloping at the proximal RIGHT femoral diaphysis concerning for myelomatous involvement. Prior RIGHT femoral ORIF with incompletely healed subtrochanteric fracture. Displaced fracture of a distal locking screw. Electronically Signed   By: Lavonia Dana M.D.   On: 05/13/2016 11:27     ASSESSMENT & PLAN:  Multiple myeloma in remission (  [facility]) I reviewed the most recent blood work with her. Her serum light chain is stable and she is responding well to treatment She has no recurrence of admission to the hospital. We will continue Pomalidomide for 21 days on, 7 days off  However, with ongoing oral infection, I will continue to hold off Daratumumab Repeat myeloma panel today is pending. I will see her back a month from now for further evaluation With her recent leg pain, I have ordered x-ray. X-ray is unremarkable apart from displaced distal locking screw and incompletely healed subtrochanteric fracture over the right femur I recommend orthopedic consultation for follow-up.  Drug-induced neutropenia (HCC) This is likely due to recent treatment. The patient denies recent history of fevers, cough, chills, diarrhea or dysuria. She is asymptomatic from the leukopenia. I will observe for now.  I will continue the chemotherapy at current dose without dosage adjustment.  If the leukopenia gets progressive worse in the future, I might have to delay her treatment or adjust the chemotherapy dose. As above, I will hold Daratumumab She will continue on Pomalidomide only  Cigarette smoker One of the recent, acute COPD exacerbation last year nearly caused respiratory failure to the point of death. She had recurrent admission to the hospital last year When she was sick, she quit smoking. Now that she is better, she resumed smoking again. Again, I spent some time explaining to her the  importance of nicotine cessation.  Poor dentition The patient has poor dentition. She is awaiting for appointment for dental extraction For that reason, she has not received Zometa lately.    Orders Placed This Encounter  Procedures  . DG Tibia/Fibula Left    Standing Status: Future     Number of Occurrences: 1     Standing Expiration Date: 07/14/2017    Order Specific Question:  Reason for Exam (SYMPTOM  OR DIAGNOSIS REQUIRED)    Answer:  bilateral leg pain, myeloma    Order Specific Question:  Preferred imaging location?    Answer:  [facility]  . DG Tibia/Fibula Right    Standing Status: Future     Number of Occurrences: 1     Standing Expiration Date: 07/14/2017    Order Specific Question:  Reason for Exam (SYMPTOM  OR DIAGNOSIS REQUIRED)    Answer:  bilateral leg pain, myeloma    Order Specific Question:  Preferred imaging location?    Answer:  [facility]  . DG FEMUR MIN 2 VIEWS LEFT    Standing Status: Future     Number of Occurrences: 1     Standing Expiration Date: 07/14/2017    Order Specific Question:  Reason for Exam (SYMPTOM  OR DIAGNOSIS REQUIRED)    Answer:  bilateral leg pain, myeloma    Order Specific Question:  Preferred imaging location?    Answer:  [facility]  . DG FEMUR, MIN 2 VIEWS RIGHT    Standing Status: Future     Number of Occurrences: 1     Standing Expiration Date: 07/14/2017    Order Specific Question:  Reason for Exam (SYMPTOM  OR DIAGNOSIS REQUIRED)    Answer:  bilateral leg pain, multiple myeloma    Order Specific Question:  Preferred imaging location?    Answer:  [facility]  . [facility] COMMUNICATION INJECTION    Schedule port flush appointment   All questions were answered. The patient knows to call the clinic with any problems, questions or concerns. No barriers to learning was  detected. I spent 30 minutes counseling the patient face to face. The total time spent in the appointment was 40 minutes and  more than 50% was on counseling and review of test results     [facility], [place], MD 05/13/2016 3:44 PM

## 2016-05-13 NOTE — Patient Instructions (Signed)

## 2016-05-13 NOTE — Telephone Encounter (Signed)
Informed pt per Dr. Alvy Bimler, her xray does not show any lytic lesions but is abnormal where she had previous surgery of her right femur.   Dr. Alvy Bimler recommends pt f/u w/ Dr. [name] for this.  Pt says she was told that the screw had come out of place on her 6 week f/u appt w/ Dr. [name].  Apparently he offered surgery at that time to fix it but pt declined.  Pt still says she does not want any more surgery.  Encouraged pt to call Ortho office for appt any ways to discuss options.   She agreed. Called Dr. Debroah Loop office and s/w Salomon Fick.  She will send him a message alerting him to the Xrays done today and that Dr. Alvy Bimler recommends pt f/u w/ him.

## 2016-05-13 NOTE — Assessment & Plan Note (Signed)
The patient has poor dentition. She is awaiting for appointment for dental extraction For that reason, she has not received Zometa lately.

## 2016-05-13 NOTE — Assessment & Plan Note (Signed)
I reviewed the most recent blood work with her. Her serum light chain is stable and she is responding well to treatment She has no recurrence of admission to the hospital. We will continue Pomalidomide for 21 days on, 7 days off  However, with ongoing oral infection, I will continue to hold off Daratumumab Repeat myeloma panel today is pending. I will see her back a month from now for further evaluation With her recent leg pain, I have ordered x-ray. X-ray is unremarkable apart from displaced distal locking screw and incompletely healed subtrochanteric fracture over the right femur I recommend orthopedic consultation for follow-up.

## 2016-05-13 NOTE — Telephone Encounter (Signed)
Gv pt appts for 8/30 @ 8.45am.

## 2016-05-13 NOTE — Assessment & Plan Note (Signed)
One of the recent, acute COPD exacerbation last year nearly caused respiratory failure to the point of death. She had recurrent admission to the hospital last year When she was sick, she quit smoking. Now that she is better, she resumed smoking again. Again, I spent some time explaining to her the importance of nicotine cessation.

## 2016-05-13 NOTE — Assessment & Plan Note (Signed)
This is likely due to recent treatment. The patient denies recent history of fevers, cough, chills, diarrhea or dysuria. She is asymptomatic from the leukopenia. I will observe for now.  I will continue the chemotherapy at current dose without dosage adjustment.  If the leukopenia gets progressive worse in the future, I might have to delay her treatment or adjust the chemotherapy dose. As above, I will hold Daratumumab She will continue on Pomalidomide only

## 2016-05-14 LAB — KAPPA/LAMBDA LIGHT CHAINS
IG KAPPA FREE LIGHT CHAIN: 42.9 mg/L — AB (ref 3.3–19.4)
Ig Lambda Free Light Chain: 7.8 mg/L (ref 5.7–26.3)
Kappa/Lambda FluidC Ratio: 5.5 — ABNORMAL HIGH (ref 0.26–1.65)

## 2016-05-15 ENCOUNTER — Other Ambulatory Visit: Payer: Self-pay | Admitting: Hematology and Oncology

## 2016-05-15 DIAGNOSIS — C9001 Multiple myeloma in remission: Secondary | ICD-10-CM

## 2016-05-15 LAB — PROTEIN ELECTROPHORESIS, SERUM
A/G Ratio: 1.7 (ref 0.7–1.7)
ALBUMIN: 3.5 g/dL (ref 2.9–4.4)
ALPHA 1: 0.2 g/dL (ref 0.0–0.4)
Alpha 2: 0.6 g/dL (ref 0.4–1.0)
BETA: 0.8 g/dL (ref 0.7–1.3)
GAMMA GLOBULIN: 0.4 g/dL (ref 0.4–1.8)
Globulin, Total: 2.1 g/dL — ABNORMAL LOW (ref 2.2–3.9)
M-Spike, %: 0.1 g/dL — ABNORMAL HIGH
Total Protein: 5.6 g/dL — ABNORMAL LOW (ref 6.0–8.5)

## 2016-05-18 ENCOUNTER — Encounter: Payer: Self-pay | Admitting: Hematology and Oncology

## 2016-05-18 NOTE — Progress Notes (Signed)
Per [employer] pomalyst shipped via [facility] 05/14/16

## 2016-05-21 DIAGNOSIS — M5481 Occipital neuralgia: Secondary | ICD-10-CM | POA: Diagnosis not present

## 2016-05-21 DIAGNOSIS — G894 Chronic pain syndrome: Secondary | ICD-10-CM | POA: Diagnosis not present

## 2016-05-21 DIAGNOSIS — Z79891 Long term (current) use of opiate analgesic: Secondary | ICD-10-CM | POA: Diagnosis not present

## 2016-05-21 DIAGNOSIS — M47812 Spondylosis without myelopathy or radiculopathy, cervical region: Secondary | ICD-10-CM | POA: Diagnosis not present

## 2016-05-29 ENCOUNTER — Encounter: Payer: Self-pay | Admitting: Internal Medicine

## 2016-05-29 ENCOUNTER — Encounter (INDEPENDENT_AMBULATORY_CARE_PROVIDER_SITE_OTHER): Payer: Medicare Other | Admitting: Internal Medicine

## 2016-05-29 ENCOUNTER — Ambulatory Visit (INDEPENDENT_AMBULATORY_CARE_PROVIDER_SITE_OTHER)
Admission: RE | Admit: 2016-05-29 | Discharge: 2016-05-29 | Disposition: A | Discharge status: Home / Self Care | Payer: Medicare Other | Source: Ambulatory Visit | Attending: Internal Medicine | Admitting: Internal Medicine

## 2016-05-29 ENCOUNTER — Telehealth: Payer: Self-pay | Admitting: *Deleted

## 2016-05-29 ENCOUNTER — Ambulatory Visit (INDEPENDENT_AMBULATORY_CARE_PROVIDER_SITE_OTHER): Payer: Medicare Other | Admitting: Internal Medicine

## 2016-05-29 VITALS — BP 118/88 | HR 67 | Ht 64.0 in | Wt 140.0 lb

## 2016-05-29 DIAGNOSIS — R0600 Dyspnea, unspecified: Secondary | ICD-10-CM

## 2016-05-29 DIAGNOSIS — J449 Chronic obstructive pulmonary disease, unspecified: Secondary | ICD-10-CM

## 2016-05-29 DIAGNOSIS — Z720 Tobacco use: Secondary | ICD-10-CM | POA: Diagnosis not present

## 2016-05-29 DIAGNOSIS — F17210 Nicotine dependence, cigarettes, uncomplicated: Secondary | ICD-10-CM

## 2016-05-29 LAB — PULMONARY FUNCTION TEST
DL/VA % pred: 53 %
DL/VA: 2.43 ml/min/mmHg/L
DLCO COR: 10.92 ml/min/mmHg
DLCO UNC % PRED: 47 %
DLCO UNC: 10.34 ml/min/mmHg
DLCO cor % pred: 50 %
FEF 25-75 PRE: 0.8 L/s
FEF 25-75 Post: 0.74 L/sec
FEF2575-%Change-Post: -7 %
FEF2575-%Pred-Post: 43 %
FEF2575-%Pred-Pre: 47 %
FEV1-%Change-Post: -1 %
FEV1-%PRED-POST: 91 %
FEV1-%PRED-PRE: 92 %
FEV1-PRE: 1.63 L
FEV1-Post: 1.62 L
FEV1FVC-%Change-Post: 4 %
FEV1FVC-%Pred-Pre: 79 %
FEV6-%CHANGE-POST: -3 %
FEV6-%PRED-POST: 114 %
FEV6-%PRED-PRE: 118 %
FEV6-PRE: 2.59 L
FEV6-Post: 2.49 L
FEV6FVC-%CHANGE-POST: 1 %
FEV6FVC-%PRED-PRE: 102 %
FEV6FVC-%Pred-Post: 103 %
FVC-%Change-Post: -5 %
FVC-%Pred-Post: 110 %
FVC-%Pred-Pre: 116 %
FVC-Post: 2.51 L
FVC-Pre: 2.64 L
POST FEV6/FVC RATIO: 99 %
Post FEV1/FVC ratio: 64 %
Pre FEV1/FVC ratio: 62 %
Pre FEV6/FVC Ratio: 98 %
RV % PRED: 115 %
RV: 2.32 L
TLC % pred: 105 %
TLC: 4.99 L

## 2016-05-29 NOTE — Telephone Encounter (Signed)
I left VM for pt asking her to return nurse's call.  Dr. Alvy Bimler wants to know the status of appointment for Dental Extraction.  Pt was referred to [facility] by Dr. Enrique Sack.    Waiting for pt to call back.

## 2016-05-29 NOTE — Progress Notes (Signed)
Subjective:    Patient ID: Kendra Johns, female    DOB: Aug 14, 1951  MRN: [id number]     Brief patient profile:  39 yowf active smoker with MM s/p stem cell 2009 and 2013 with recurrence requiring chemo since 2013 but not working so RT to   lower ribs both sides Dec 2014  started new treatment of Jan 2015 and monthly until Jun 5th  p doe which started in April, worse  By end of May  ? Better since late May (? p d/c ACEi?)with last rx June 5th referred by Dr Sherren Mocha for eval of sob.   History of Present Illness  From chart: 10/10/2013 - 10/20/2013  Radiation Therapy  Started on palliative XRT for rib pain  11/27/2013  Carfilzomib d/c 04/06/14 last dose   05/16/2014 1st [facility] office visit/ Piers Baade  Chief Complaint  Patient presents with  . Pulmonary Consult    Referred per Dr. Sherren Mocha. Pt c/o SOB for since started on chemo in March 2015.  She states that she is SOB with or without exertion.  She gets out of breath with walking approx 100 ft.   whereas at one point could not do the aisle at [facility] and now can . No cough since off ACEi  Not using saba now but seemed to help cough and congestion when she was at her worst. Not sure symbicort helped No purulent or excessive mucus rec Please remember to go to the x-ray department downstairs for your tests - we will call you with the results when they are available. The key is to stop smoking completely before smoking completely stops you!  Please schedule a follow up office visit in 6 weeks, call sooner if needed - do not restart the lisinopril in meantime as there are plenty of other choices if your blood pressure rises again - see me or Dr Sherren Mocha if needed in meantime Late add : pfts scheduled for return    09/21/2014 acute ov/Angeleigh Chiasson re: active smoker/ on ACEi/ refractory cough and wheeze  Chief Complaint  Patient presents with  . Acute Visit    Pt c/o SOB and cough for the past 10 days. Cough is prod with brown/blood tinged sputum.   rx  by ED x 7 days prior to OV  Zpak/ prednisone some better using both saba hfa and neb > 4 x daily since acute onset on symptoms 10 d prior to OV   rec I strongly recommend you stop lisinopril  Benicar 40 mg one daily x 2 weeks Augmentin 875 mg take one pill twice daily  X 10 days Mucinex dm 1200 mg every 12 hours as needed If still short or breath or cough use atrovent hfa 2 pffs every 4 hours as needed  If still coughing >  Try prilosec 20mg   Take 30-60 min before first meal of the day and Pepcid 20 mg one bedtime until cough is completely gone for at least a week without the need for cough suppression. Please schedule a follow up office visit in 2 weeks to see Tammy NP, sooner if needed and she will check your blood pressure and be sure you are better and see me for pfts>  Did not return as improved somewhat    01/15/2016  reconsult per Dr Yong Channel Melvyn Novas re: active smoker maint rx spiriva and prn atrovent due to "albuterol allergy" but note has used alb neb fine  Chief Complaint  Patient presents with  . Advice Only  Pt c/o continued cough with clear/white/brown mucus, wheeze and SOB. Pt states that she does feel she has improved some.   breathing worse since  Indolent onset 09/2015 gradually progressive to point x  across house / no early am or noct cough/congestion wheeze rec The key is to stop smoking completely before smoking completely stops you - ok to use the e cigs as a one way bridge off all tobacco products Plan A = Automatic =  Symbicort 160 Take 2 puffs first thing in am and then another 2 puffs about 12 hours later.  Work on inhaler technique:   Plan B = backup  Only use your atrovent hfa     05/29/2016  f/u ov/Jessaca Philippi re:  GOLD I copd/ still smoking  On advair 2 bid / atrovent prn but but not using  Chief Complaint  Patient presents with  . Follow-up    PFT done today. Pt c/o continued cough with white/brown mucus, SOB with exertion. Pt denies wheeze/CP/tightness.   congested  cough all day Not limited by breathing but by leg pains   No obvious day to day or daytime variabilty or assoc  cp or chest tightness,   overt sinus or hb symptoms. No unusual exp hx or h/o childhood pna/ asthma or knowledge of premature birth.  Sleeping ok without nocturnal  or early am exacerbation  of respiratory  c/o's or need for noct saba. Also denies any obvious fluctuation of symptoms with weather or environmental changes or other aggravating or alleviating factors except as outlined above   Current Medications, Allergies, Complete Past Medical History, Past Surgical History, Family History, and Social History were reviewed in [employer] record.  ROS  The following are not active complaints unless bolded sore throat, dysphagia, dental problems, itching, sneezing,  nasal congestion or excess/ purulent secretions, ear ache,   fever, chills, sweats, unintended wt loss, pleuritic or exertional cp, hemoptysis,  orthopnea pnd or leg swelling, presyncope, palpitations, heartburn, abdominal pain, anorexia, nausea, vomiting, diarrhea  or change in bowel or urinary habits, change in stools or urine, dysuria,hematuria,  rash, arthralgias, visual complaints, headache, numbness weakness or ataxia or problems with walking or coordination,  change in mood/affect or memory.              Objective:   Physical Exam  amb wf nad  05/29/2016        140  01/15/2016        164   09/21/14 123 lb (55.792 kg)  09/14/14 116 lb (52.617 kg)  08/30/14 119 lb 9.6 oz (54.25 kg)    Vital signs reviewed     HEENT: nl dentition, turbinates, and orophanx. Nl external ear canals without cough reflex   NECK :  without JVD/Nodes/TM/ nl carotid upstrokes bilaterally   LUNGS: no acc muscle use,   Mild insp and exp rhonchi bilaterally    CV:  RRR  no s3 or murmur or increase in P2, no edema   ABD:  soft and nontender with nl excursion in the supine position. No bruits or organomegaly,  bowel sounds nl  MS:  warm without deformities, calf tenderness, cyanosis or clubbing  SKIN: warm and dry without lesions    NEURO:  alert, approp, no deficits   CXR PA and Lateral:   05/29/2016 :    I personally reviewed images and agree with radiology impression as follows:    COPD.  No active disease.  Marland Kitchen  Assessment & Plan:

## 2016-05-29 NOTE — Patient Instructions (Addendum)
The key is to stop smoking completely before smoking completely stops you - it is definitely not too late  Plan A = Automatic =  Advair Take 2 puffs first thing in am and then another 2 puffs about 12 hours later.    Plan B = Backup Only use your atovent  as a rescue medication to be used if you can't catch your breath by resting or doing a relaxed purse lip breathing pattern.  - The less you use it, the better it will work when you need it. - Ok to use the inhaler up to 2 puffs  every 4 hours if you must but call for appointment if use goes up over your usual need - Don't leave home without it !!  (think of it like the spare tire for your car)   Please remember to go to the x-ray department downstairs for your tests - we will call you with the results when they are available.   If you are satisfied with your treatment plan,  let your doctor know and he/she can either refill your medications or you can return here when your prescription runs out.     If in any way you are not 100% satisfied,  please tell us.  If 100% better, tell your friends!  Pulmonary follow up is as needed

## 2016-06-01 ENCOUNTER — Other Ambulatory Visit: Payer: Self-pay | Admitting: Family Medicine

## 2016-06-01 NOTE — Assessment & Plan Note (Signed)
>   3 min discussion I reviewed the Fletcher curve with the patient that basically indicates  if you quit smoking when your best day FEV1 is still well preserved (as is clearly  the case here)  it is highly unlikely you will progress to severe disease and informed the patient there was no medication on the market that has proven to alter the curve/ its downward trajectory  or the likelihood of progression of their disease.  Therefore stopping smoking and maintaining abstinence is the most important aspect of care, not choice of inhalers or for that matter, doctors.    Pulmonary f/u is prn

## 2016-06-01 NOTE — Progress Notes (Signed)
Spoke with pt and notified of results per Dr. Wert. Pt verbalized understanding and denied any questions. 

## 2016-06-01 NOTE — Assessment & Plan Note (Signed)
-   01/15/2016  extensive coaching HFA effectiveness =    75% > try symbicort 160 2bid > return for pfts  - PFT's  05/29/2016  FEV1 1.62 (91 % ) ratio 64  p no % improvement from saba p nothing prior to study with DLCO  47/50 % corrects to 94 % for alv volume    I had an extended final summary discussion with the patient reviewing all relevant studies completed to date and  lasting 15 to 20 minutes of a 25 minute visit on the following issues:    She only has mild/mod copd and most of her problem is CB from continued smoking against med advice so little to offer at this point other than reinforce smoking cessatoin (see separate a/p)   Each maintenance medication was reviewed in detail including most importantly the difference between maintenance and as needed and under what circumstances the prns are to be used.  Please see instructions for details which were reviewed in writing and the patient given a copy.

## 2016-06-08 ENCOUNTER — Other Ambulatory Visit: Payer: Self-pay | Admitting: Hematology and Oncology

## 2016-06-08 ENCOUNTER — Other Ambulatory Visit: Payer: Self-pay | Admitting: *Deleted

## 2016-06-08 DIAGNOSIS — D696 Thrombocytopenia, unspecified: Secondary | ICD-10-CM

## 2016-06-08 DIAGNOSIS — C9001 Multiple myeloma in remission: Secondary | ICD-10-CM

## 2016-06-08 DIAGNOSIS — D649 Anemia, unspecified: Secondary | ICD-10-CM

## 2016-06-08 MED ORDER — POMALIDOMIDE 3 MG PO CAPS: 3.0000 mg | ORAL_CAPSULE | Freq: Every day | ORAL | 0 refills | Status: DC

## 2016-06-08 MED ORDER — ALPRAZOLAM 0.25 MG PO TABS: 0.2500 mg | ORAL_TABLET | Freq: Every evening | ORAL | 0 refills | Status: DC | PRN

## 2016-06-08 NOTE — Telephone Encounter (Signed)
Pt states she is going to [facility] this week on 8/9 for dental extraction.   RN refilled pomalyst and xanax per Dr Alvy Bimler.  Informed patient to plan on resuming Pomalyst week after extraction. And will restart Daratumumab on 8/30

## 2016-06-09 DIAGNOSIS — J449 Chronic obstructive pulmonary disease, unspecified: Secondary | ICD-10-CM | POA: Diagnosis not present

## 2016-06-09 DIAGNOSIS — I10 Essential (primary) hypertension: Secondary | ICD-10-CM | POA: Diagnosis not present

## 2016-06-09 DIAGNOSIS — K089 Disorder of teeth and supporting structures, unspecified: Secondary | ICD-10-CM | POA: Diagnosis not present

## 2016-06-09 DIAGNOSIS — E785 Hyperlipidemia, unspecified: Secondary | ICD-10-CM | POA: Diagnosis not present

## 2016-06-09 DIAGNOSIS — B182 Chronic viral hepatitis C: Secondary | ICD-10-CM | POA: Diagnosis not present

## 2016-06-09 DIAGNOSIS — C9002 Multiple myeloma in relapse: Secondary | ICD-10-CM | POA: Diagnosis not present

## 2016-06-09 DIAGNOSIS — F17210 Nicotine dependence, cigarettes, uncomplicated: Secondary | ICD-10-CM | POA: Diagnosis not present

## 2016-06-09 DIAGNOSIS — E039 Hypothyroidism, unspecified: Secondary | ICD-10-CM | POA: Diagnosis not present

## 2016-06-10 DIAGNOSIS — Z8579 Personal history of other malignant neoplasms of lymphoid, hematopoietic and related tissues: Secondary | ICD-10-CM | POA: Diagnosis not present

## 2016-06-10 DIAGNOSIS — K056 Periodontal disease, unspecified: Secondary | ICD-10-CM | POA: Diagnosis not present

## 2016-06-10 DIAGNOSIS — K089 Disorder of teeth and supporting structures, unspecified: Secondary | ICD-10-CM | POA: Diagnosis not present

## 2016-06-10 DIAGNOSIS — K08129 Complete loss of teeth due to periodontal diseases, unspecified class: Secondary | ICD-10-CM | POA: Diagnosis not present

## 2016-06-14 ENCOUNTER — Telehealth: Payer: Self-pay | Admitting: Hematology and Oncology

## 2016-06-14 NOTE — Telephone Encounter (Signed)
S/w pt, advised 5hr infusion added to 8/30 appt but start time remains 8.45am. Pt verbalized understanding.

## 2016-06-17 DIAGNOSIS — G894 Chronic pain syndrome: Secondary | ICD-10-CM | POA: Diagnosis not present

## 2016-06-17 DIAGNOSIS — M47812 Spondylosis without myelopathy or radiculopathy, cervical region: Secondary | ICD-10-CM | POA: Diagnosis not present

## 2016-06-17 DIAGNOSIS — M5481 Occipital neuralgia: Secondary | ICD-10-CM | POA: Diagnosis not present

## 2016-06-17 DIAGNOSIS — Z79891 Long term (current) use of opiate analgesic: Secondary | ICD-10-CM | POA: Diagnosis not present

## 2016-06-18 ENCOUNTER — Telehealth: Payer: Self-pay | Admitting: *Deleted

## 2016-06-18 NOTE — Telephone Encounter (Signed)
Did she has her dental extractions yet? If she did, she can resume on 8/20

## 2016-06-18 NOTE — Telephone Encounter (Signed)
Pt says she had her teeth pulled on 8/9.   Instructed ok to resume Pomalyst, no need to wait.  She will start Pomalyst again on this Sunday 8/20.   She will keep appts here on 8/30 as scheduled.

## 2016-06-18 NOTE — Telephone Encounter (Signed)
Pt left VM asking is she is supposed to resume Pomalyst on 8/20 or wait until her next appt on 8/30?  She has been off it for the past two weeks.  She said she had her "week off" and then was told to hold for another week?

## 2016-06-25 DIAGNOSIS — M47812 Spondylosis without myelopathy or radiculopathy, cervical region: Secondary | ICD-10-CM | POA: Diagnosis not present

## 2016-06-25 DIAGNOSIS — M5481 Occipital neuralgia: Secondary | ICD-10-CM | POA: Diagnosis not present

## 2016-06-25 DIAGNOSIS — Z79891 Long term (current) use of opiate analgesic: Secondary | ICD-10-CM | POA: Diagnosis not present

## 2016-06-25 DIAGNOSIS — G894 Chronic pain syndrome: Secondary | ICD-10-CM | POA: Diagnosis not present

## 2016-07-01 ENCOUNTER — Ambulatory Visit (HOSPITAL_BASED_OUTPATIENT_CLINIC_OR_DEPARTMENT_OTHER): Payer: Medicare Other

## 2016-07-01 ENCOUNTER — Encounter: Payer: Self-pay | Admitting: Hematology and Oncology

## 2016-07-01 ENCOUNTER — Other Ambulatory Visit (HOSPITAL_BASED_OUTPATIENT_CLINIC_OR_DEPARTMENT_OTHER): Payer: Medicare Other

## 2016-07-01 ENCOUNTER — Ambulatory Visit (HOSPITAL_BASED_OUTPATIENT_CLINIC_OR_DEPARTMENT_OTHER): Payer: Medicare Other | Admitting: Hematology and Oncology

## 2016-07-01 ENCOUNTER — Other Ambulatory Visit: Payer: Self-pay | Admitting: Hematology and Oncology

## 2016-07-01 ENCOUNTER — Ambulatory Visit: Payer: Medicare Other

## 2016-07-01 VITALS — BP 136/68 | HR 59 | Temp 98.5°F | Resp 17

## 2016-07-01 DIAGNOSIS — C9002 Multiple myeloma in relapse: Secondary | ICD-10-CM

## 2016-07-01 DIAGNOSIS — C9001 Multiple myeloma in remission: Secondary | ICD-10-CM | POA: Diagnosis not present

## 2016-07-01 DIAGNOSIS — M545 Low back pain, unspecified: Secondary | ICD-10-CM

## 2016-07-01 DIAGNOSIS — Z720 Tobacco use: Secondary | ICD-10-CM | POA: Diagnosis not present

## 2016-07-01 DIAGNOSIS — F17210 Nicotine dependence, cigarettes, uncomplicated: Secondary | ICD-10-CM

## 2016-07-01 DIAGNOSIS — Z5112 Encounter for antineoplastic immunotherapy: Secondary | ICD-10-CM | POA: Diagnosis not present

## 2016-07-01 DIAGNOSIS — C9000 Multiple myeloma not having achieved remission: Secondary | ICD-10-CM

## 2016-07-01 LAB — COMPREHENSIVE METABOLIC PANEL
ALT: 12 U/L (ref 0–55)
AST: 17 U/L (ref 5–34)
Albumin: 3.4 g/dL — ABNORMAL LOW (ref 3.5–5.0)
Alkaline Phosphatase: 109 U/L (ref 40–150)
Anion Gap: 9 mEq/L (ref 3–11)
BUN: 7.8 mg/dL (ref 7.0–26.0)
CALCIUM: 9.2 mg/dL (ref 8.4–10.4)
CHLORIDE: 108 meq/L (ref 98–109)
CO2: 22 mEq/L (ref 22–29)
Creatinine: 0.7 mg/dL (ref 0.6–1.1)
EGFR: >90 mL/min/{1.73_m2} (ref >90)
Glucose: 101 mg/dl (ref 70–140)
POTASSIUM: 4.1 meq/L (ref 3.5–5.1)
SODIUM: 140 meq/L (ref 136–145)
Total Bilirubin: 0.42 mg/dL (ref 0.20–1.20)
Total Protein: 6.1 g/dL — ABNORMAL LOW (ref 6.4–8.3)

## 2016-07-01 LAB — CBC WITH DIFFERENTIAL/PLATELET
BASO%: 1.1 % (ref 0.0–2.0)
BASOS ABS: 0.1 10*3/uL (ref 0.0–0.1)
EOS%: 9.2 % — AB (ref 0.0–7.0)
Eosinophils Absolute: 0.5 10*3/uL (ref 0.0–0.5)
HEMATOCRIT: 40.6 % (ref 34.8–46.6)
HGB: 13.3 g/dL (ref 11.6–15.9)
LYMPH%: 14.7 % (ref 14.0–49.7)
MCH: 29 pg (ref 25.1–34.0)
MCHC: 32.8 g/dL (ref 31.5–36.0)
MCV: 88.3 fL (ref 79.5–101.0)
MONO#: 0.6 10*3/uL (ref 0.1–0.9)
MONO%: 11.9 % (ref 0.0–14.0)
NEUT#: 3.2 10*3/uL (ref 1.5–6.5)
NEUT%: 63.1 % (ref 38.4–76.8)
Platelets: 176 10*3/uL (ref 145–400)
RBC: 4.6 10*6/uL (ref 3.70–5.45)
RDW: 18.3 % — ABNORMAL HIGH (ref 11.2–14.5)
WBC: 5.1 10*3/uL (ref 3.9–10.3)
lymph#: 0.8 10*3/uL — ABNORMAL LOW (ref 0.9–3.3)

## 2016-07-01 MED ORDER — ACETAMINOPHEN 325 MG PO TABS
ORAL_TABLET | ORAL | Status: AC
  Filled 2016-07-01: qty 2

## 2016-07-01 MED ORDER — SODIUM CHLORIDE 0.9% FLUSH
10.0000 mL | INTRAVENOUS | Status: DC | PRN
  Administered 2016-07-01: 10 mL via INTRAVENOUS
  Filled 2016-07-01: qty 10

## 2016-07-01 MED ORDER — METHYLPREDNISOLONE SODIUM SUCC 125 MG IJ SOLR
INTRAMUSCULAR | Status: AC
  Filled 2016-07-01: qty 2

## 2016-07-01 MED ORDER — HEPARIN SOD (PORK) LOCK FLUSH 100 UNIT/ML IV SOLN
500.0000 [IU] | Freq: Once | INTRAVENOUS | Status: AC | PRN
  Administered 2016-07-01: 500 [IU]
  Filled 2016-07-01: qty 5

## 2016-07-01 MED ORDER — DIPHENHYDRAMINE HCL 25 MG PO CAPS
50.0000 mg | ORAL_CAPSULE | Freq: Once | ORAL | Status: AC
  Administered 2016-07-01: 50 mg via ORAL

## 2016-07-01 MED ORDER — SODIUM CHLORIDE 0.9 % IV SOLN
Freq: Once | INTRAVENOUS | Status: AC
  Administered 2016-07-01: 10:00:00 via INTRAVENOUS

## 2016-07-01 MED ORDER — METHYLPREDNISOLONE SODIUM SUCC 125 MG IJ SOLR
125.0000 mg | Freq: Once | INTRAMUSCULAR | Status: AC
  Administered 2016-07-01: 125 mg via INTRAVENOUS

## 2016-07-01 MED ORDER — PROCHLORPERAZINE MALEATE 10 MG PO TABS
10.0000 mg | ORAL_TABLET | Freq: Once | ORAL | Status: AC
  Administered 2016-07-01: 10 mg via ORAL

## 2016-07-01 MED ORDER — ACETAMINOPHEN 325 MG PO TABS
650.0000 mg | ORAL_TABLET | Freq: Once | ORAL | Status: AC
  Administered 2016-07-01: 650 mg via ORAL

## 2016-07-01 MED ORDER — SODIUM CHLORIDE 0.9 % IV SOLN
15.7000 mg/kg | Freq: Once | INTRAVENOUS | Status: AC
  Administered 2016-07-01: 1100 mg via INTRAVENOUS
  Filled 2016-07-01: qty 40

## 2016-07-01 MED ORDER — ALTEPLASE 2 MG IJ SOLR
2.0000 mg | Freq: Once | INTRAMUSCULAR | Status: DC | PRN
  Filled 2016-07-01: qty 2

## 2016-07-01 MED ORDER — SODIUM CHLORIDE 0.9% FLUSH
10.0000 mL | INTRAVENOUS | Status: DC | PRN
  Administered 2016-07-01: 10 mL
  Filled 2016-07-01: qty 10

## 2016-07-01 MED ORDER — HEPARIN SOD (PORK) LOCK FLUSH 100 UNIT/ML IV SOLN
250.0000 [IU] | Freq: Once | INTRAVENOUS | Status: DC | PRN
  Filled 2016-07-01: qty 5

## 2016-07-01 MED ORDER — PROCHLORPERAZINE MALEATE 10 MG PO TABS
ORAL_TABLET | ORAL | Status: AC
  Filled 2016-07-01: qty 1

## 2016-07-01 MED ORDER — SODIUM CHLORIDE 0.9% FLUSH
3.0000 mL | INTRAVENOUS | Status: DC | PRN
  Filled 2016-07-01: qty 10

## 2016-07-01 MED ORDER — DIPHENHYDRAMINE HCL 25 MG PO CAPS
ORAL_CAPSULE | ORAL | Status: AC
  Filled 2016-07-01: qty 2

## 2016-07-01 NOTE — Patient Instructions (Signed)
[facility] Discharge Instructions for Patients Receiving Chemotherapy  Today you received the following chemotherapy agents Darzalex.  To help prevent nausea and vomiting after your treatment, we encourage you to take your nausea medication as directed.  If you develop nausea and vomiting that is not controlled by your nausea medication, call the clinic.   BELOW ARE SYMPTOMS THAT SHOULD BE REPORTED IMMEDIATELY:  *FEVER GREATER THAN 100.5 F  *CHILLS WITH OR WITHOUT FEVER  NAUSEA AND VOMITING THAT IS NOT CONTROLLED WITH YOUR NAUSEA MEDICATION  *UNUSUAL SHORTNESS OF BREATH  *UNUSUAL BRUISING OR BLEEDING  TENDERNESS IN MOUTH AND THROAT WITH OR WITHOUT PRESENCE OF ULCERS  *URINARY PROBLEMS  *BOWEL PROBLEMS  UNUSUAL RASH Items with * indicate a potential emergency and should be followed up as soon as possible.  Feel free to call the clinic you have any questions or concerns. The clinic phone number is [phone].  Please show the CHEMO ALERT CARD at check-in to the Emergency Department and triage nurse.    

## 2016-07-01 NOTE — Assessment & Plan Note (Signed)
I reviewed the most recent blood work with her. Her serum light chain is stable and she is responding well to treatment She has no recurrence of admission to the hospital. We will continue Pomalidomide for 21 days on, 7 days off  With recent dental extraction, I recommend resuming Daratumumab Due to upcoming holiday, she will get week 8 treatment on September 8 Repeat myeloma panel today is pending. I will see her back prior to week 9 of treatment  In the future, I will add Zometa In the meantime, she will take calcium with vitamin D supplement

## 2016-07-01 NOTE — Assessment & Plan Note (Signed)
One of the recent, acute COPD exacerbation last year nearly caused respiratory failure to the point of death. She had recurrent admission to the hospital last year When she was sick, she quit smoking. Now that she is better, she resumed smoking again. Again, I spent some time explaining to her the importance of nicotine cessation.

## 2016-07-01 NOTE — Assessment & Plan Note (Signed)
She will continue pain management through the pain clinic.

## 2016-07-01 NOTE — Patient Instructions (Signed)

## 2016-07-01 NOTE — Progress Notes (Signed)
[facility] OFFICE PROGRESS NOTE  Patient Care Team: Marin Olp, MD as PCP - General (Family Medicine) Jeanann Lewandowsky, MD as Consulting Physician (Medical Oncology) Marin Olp, MD as Consulting Physician (Family Medicine) Heath Lark, MD as Consulting Physician (Hematology and Oncology) [name], MD as Consulting Physician (Orthopedic Surgery)  SUMMARY OF ONCOLOGIC HISTORY: Oncology History   Multiple myeloma, kappa light chain disease, Durie-Salmon stage III       Multiple myeloma in remission ([place])   07/16/2006 Bone Marrow Biopsy    BM biopsy is non-diagnostic      09/21/2006 Procedure    L5 vertebral biopsy 5% plasma cell      10/25/2006 Bone Marrow Biopsy    BM biopsy is hypercellular with 5% plasma cell      11/17/2006 Procedure    L5 biopsy confirmed plasmacytoma      01/03/2007 - 01/10/2007 Radiation Therapy    Approximate date only, received RT for plasmacytoma followed by surgery      09/14/2007 Initial Diagnosis    MULTIPLE  MYELOMA      10/05/2007 Bone Marrow Biopsy    Bm biopsy was negative      06/18/2008 Bone Marrow Biopsy    BM biopsy was negative      07/26/2008 Bone Marrow Transplant    Stem cell transplant at [facility]      04/13/2011 Relapse/Recurrence    Disease relapse      04/14/2011 Bone Marrow Biopsy    Bm biopsy showed 2 % plasma cell      04/27/2011 Relapse/Recurrence    Disease relapse, treated with Velcade/Cytoxan/Dex      09/07/2011 - 07/28/2013 Chemotherapy    She has been receiving Velcade      12/27/2011 Bone Marrow Transplant    2nd transplant at [facility]      07/28/2013 Relapse/Recurrence    Chemo is stopped due to progression of disease      08/29/2013 Imaging    PEt/CT showed recurrence of disease with new lesion on her rib with compression fracture      09/13/2013 Bone Marrow Biopsy    BM biopsy is hypercellular with 6% plasma cell      10/10/2013 - 10/20/2013 Radiation Therapy     Started on palliative XRT for rib pain      11/27/2013 - 04/06/2014 Chemotherapy    The patient starts chemotherapy with Carfilzomib      07/19/2014 Imaging    Repeat bloodwork and PET/CT scan shows significant disease progression.      08/02/2014 - 03/06/2015 Chemotherapy    She enrolled in clinical trial using combination therapy with Revlimid, dexamethasone and Elotuzumab      03/07/2015 - 06/04/2015 Chemotherapy    She is on maintenance Revlimid only without dexamethasone      05/27/2015 Imaging     MRI spine showed new compression fracture.      06/04/2015 - 06/18/2015 Radiation Therapy     she received palliative radiation therapy to 25 Gy C7-T1      07/10/2015 - 08/07/2015 Chemotherapy    She received Elotuzumab, dex and Revlimid. Rx is stopped due to progression      08/01/2015 Imaging    MRI of the spine was performed today due to new onset of worsening right hip pain/flank area. MRI show mild progression of the L1 vertebral body.      08/21/2015 - 08/26/2015 Hospital Admission    She was admitted to the hospital due to pathologic  fracture to right femur      08/22/2015 Surgery    She had IM nail to fractured femur      08/23/2015 - 09/20/2015 Radiation Therapy    She received XRT to her L1 L2 and Right Femur      09/05/2015 - 09/06/2015 Hospital Admission    She had recurrent admission due to displaced fracture, managed conservatively      09/12/2015 - 09/17/2015 Hospital Admission    She had recurrent admission for COPD exacerbation      09/30/2015 -  Chemotherapy    She was started on Pomalyst      12/10/2015 -  Chemotherapy    She started weekly Daratumumab along with Pomalyst       INTERVAL HISTORY: Please see below for problem oriented charting. She returns for follow-up. She had recent complete dental extraction and appears to be healing well. She has mild increased bone pain, currently managed through pain clinic. She continues to smoke and unable to  quit. She denies recent infection. She denies recent cough.  REVIEW OF SYSTEMS:   Constitutional: Denies fevers, chills or abnormal weight loss Eyes: Denies blurriness of vision Ears, nose, mouth, throat, and face: Denies mucositis or sore throat Respiratory: Denies cough, dyspnea or wheezes Cardiovascular: Denies palpitation, chest discomfort or lower extremity swelling Gastrointestinal:  Denies nausea, heartburn or change in bowel habits Skin: Denies abnormal skin rashes Lymphatics: Denies new lymphadenopathy or easy bruising Neurological:Denies numbness, tingling or new weaknesses Behavioral/Psych: Mood is stable, no new changes  All other systems were reviewed with the patient and are negative.  I have reviewed the past medical history, past surgical history, social history and family history with the patient and they are unchanged from previous note.  ALLERGIES:  is allergic to codeine; ibuprofen; and albuterol.  MEDICATIONS:  Current Outpatient Prescriptions  Medication Sig Dispense Refill  . acyclovir (ZOVIRAX) 400 MG tablet TAKE 1 TABLET BY MOUTH EVERY DAY 30 tablet 6  . ALPRAZolam (XANAX) 0.25 MG tablet Take 1 tablet (0.25 mg total) by mouth at bedtime as needed for anxiety. 60 tablet 0  . aspirin 81 MG tablet Take 81 mg by mouth daily.    . chlorhexidine (PERIDEX) 0.12 % solution Rinse with 15 mls twice daily for 30 seconds. Use after breakfast and at bedtime. Spit out excess. Do not swallow. 480 mL prn  . cyclobenzaprine (FLEXERIL) 10 MG tablet Take 1 tablet (10 mg total) by mouth 3 (three) times daily as needed for muscle spasms. 15 tablet 0  . fluticasone-salmeterol (ADVAIR HFA) 115-21 MCG/ACT inhaler Inhale 2 puffs into the lungs 2 (two) times daily. 1 Inhaler 5  . gabapentin (NEURONTIN) 600 MG tablet Take 1 tablet (600 mg total) by mouth 3 (three) times daily. 90 tablet 3  . ipratropium (ATROVENT HFA) 17 MCG/ACT inhaler Inhale 2 puffs into the lungs every 4 (four) hours  as needed for wheezing. 1 Inhaler 12  . levothyroxine (SYNTHROID, LEVOTHROID) 50 MCG tablet TAKE 1 TABLET (50 MCG TOTAL) BY MOUTH DAILY BEFORE BREAKFAST. 90 tablet 1  . lidocaine-prilocaine (EMLA) cream Apply 1 application topically as needed. 30 g 6  . losartan (COZAAR) 50 MG tablet TAKE 1 TABLET (50 MG TOTAL) BY MOUTH DAILY. 90 tablet 1  . morphine (MS CONTIN) 100 MG 12 hr tablet Take 100 mg by mouth every 8 (eight) hours.     . Oxycodone HCl 20 MG TABS Take 20 mg by mouth every 4 (four) hours as needed.    Marland Kitchen  pomalidomide (POMALYST) 3 MG capsule Take 1 capsule (3 mg total) by mouth daily. Take with water on days 1-21. Repeat every 28 days. 21 capsule 0  . prochlorperazine (COMPAZINE) 10 MG tablet Take 10 mg by mouth every 6 (six) hours as needed. for nausea  3  . senna-docusate (SENOKOT-S) 8.6-50 MG tablet Take 1 tablet by mouth daily.    . Vitamin D, Ergocalciferol, (DRISDOL) 50000 units CAPS capsule TAKE 1 CAPSULE (50,000 UNITS TOTAL) BY MOUTH ONCE A WEEK. 12 capsule 3   No current facility-administered medications for this visit.    Facility-Administered Medications Ordered in Other Visits  Medication Dose Route Frequency Provider Last Rate Last Dose  . alteplase (CATHFLO ACTIVASE) injection 2 mg  2 mg Intracatheter Once PRN Heath Lark, MD      . heparin lock flush 100 unit/mL  500 Units Intracatheter Once PRN Heath Lark, MD      . heparin lock flush 100 unit/mL  250 Units Intracatheter Once PRN Heath Lark, MD      . sodium chloride 0.9 % injection 10 mL  10 mL Intravenous PRN Heath Lark, MD   10 mL at 02/19/16 0911  . sodium chloride flush (NS) 0.9 % injection 10 mL  10 mL Intracatheter PRN Rexine Gowens, MD      . sodium chloride flush (NS) 0.9 % injection 3 mL  3 mL Intravenous PRN Heath Lark, MD        PHYSICAL EXAMINATION: ECOG PERFORMANCE STATUS: 1 - Symptomatic but completely ambulatory  Vitals:   07/01/16 0910  BP: 127/70  Pulse: 63  Resp: 18  Temp: 98.2 F (36.8 C)    Filed Weights   07/01/16 0910  Weight: 142 lb 12.8 oz (64.8 kg)    GENERAL:alert, no distress and comfortable SKIN: skin color, texture, turgor are normal, no rashes or significant lesions EYES: normal, Conjunctiva are pink and non-injected, sclera clear OROPHARYNX:no exudate, no erythema and lips, buccal mucosa, and tongue normal  NECK: supple, thyroid normal size, non-tender, without nodularity LYMPH:  no palpable lymphadenopathy in the cervical, axillary or inguinal LUNGS: clear to auscultation and percussion with normal breathing effort HEART: regular rate & rhythm and no murmurs and no lower extremity edema ABDOMEN:abdomen soft, non-tender and normal bowel sounds Musculoskeletal:no cyanosis of digits and no clubbing  NEURO: alert & oriented x 3 with fluent speech, no focal motor/sensory deficits  LABORATORY DATA:  I have reviewed the data as listed    Component Value Date/Time   NA 140 07/01/2016 0852   K 4.1 07/01/2016 0852   CL 107 09/11/2015 2255   CL 107 03/10/2013 1014   CO2 22 07/01/2016 0852   GLUCOSE 101 07/01/2016 0852   GLUCOSE 111 (H) 03/10/2013 1014   BUN 7.8 07/01/2016 0852   CREATININE 0.7 07/01/2016 0852   CALCIUM 9.2 07/01/2016 0852   PROT 6.1 (L) 07/01/2016 0852   ALBUMIN 3.4 (L) 07/01/2016 0852   AST 17 07/01/2016 0852   ALT 12 07/01/2016 0852   ALKPHOS 109 07/01/2016 0852   BILITOT 0.42 07/01/2016 0852   GFRNONAA >60 09/12/2015 0620   GFRAA >60 09/12/2015 0620    No results found for: SPEP, UPEP  Lab Results  Component Value Date   WBC 5.1 07/01/2016   NEUTROABS 3.2 07/01/2016   HGB 13.3 07/01/2016   HCT 40.6 07/01/2016   MCV 88.3 07/01/2016   PLT 176 07/01/2016      Chemistry      Component Value Date/Time  NA 140 07/01/2016 0852   K 4.1 07/01/2016 0852   CL 107 09/11/2015 2255   CL 107 03/10/2013 1014   CO2 22 07/01/2016 0852   BUN 7.8 07/01/2016 0852   CREATININE 0.7 07/01/2016 0852      Component Value Date/Time    CALCIUM 9.2 07/01/2016 0852   ALKPHOS 109 07/01/2016 0852   AST 17 07/01/2016 0852   ALT 12 07/01/2016 0852   BILITOT 0.42 07/01/2016 0852     ASSESSMENT & PLAN:  Multiple myeloma in remission ([place]) I reviewed the most recent blood work with her. Her serum light chain is stable and she is responding well to treatment She has no recurrence of admission to the hospital. We will continue Pomalidomide for 21 days on, 7 days off  With recent dental extraction, I recommend resuming Daratumumab Due to upcoming holiday, she will get week 8 treatment on September 8 Repeat myeloma panel today is pending. I will see her back prior to week 9 of treatment  In the future, I will add Zometa In the meantime, she will take calcium with vitamin D supplement  Bilateral low back pain without sciatica She will continue pain management through the pain clinic.  Cigarette smoker One of the recent, acute COPD exacerbation last year nearly caused respiratory failure to the point of death. She had recurrent admission to the hospital last year When she was sick, she quit smoking. Now that she is better, she resumed smoking again. Again, I spent some time explaining to her the importance of nicotine cessation.   No orders of the defined types were placed in this encounter.  All questions were answered. The patient knows to call the clinic with any problems, questions or concerns. No barriers to learning was detected. I spent 20 minutes counseling the patient face to face. The total time spent in the appointment was 25 minutes and more than 50% was on counseling and review of test results     [facility], [place], MD 07/01/2016 11:04 AM

## 2016-07-07 LAB — MULTIPLE MYELOMA PANEL, SERUM
ALBUMIN/GLOB SERPL: 1.7 (ref 0.7–1.7)
Albumin SerPl Elph-Mcnc: 3.6 g/dL (ref 2.9–4.4)
Alpha 1: 0.3 g/dL (ref 0.0–0.4)
Alpha2 Glob SerPl Elph-Mcnc: 0.7 g/dL (ref 0.4–1.0)
B-Globulin SerPl Elph-Mcnc: 0.9 g/dL (ref 0.7–1.3)
GAMMA GLOB SERPL ELPH-MCNC: 0.4 g/dL (ref 0.4–1.8)
GLOBULIN, TOTAL: 2.2 g/dL (ref 2.2–3.9)
IGA/IMMUNOGLOBULIN A, SERUM: 35 mg/dL — AB (ref 87–352)
IGM (IMMUNOGLOBIN M), SRM: 15 mg/dL — AB (ref 26–217)
IgG, Qn, Serum: 407 mg/dL — ABNORMAL LOW (ref 700–1600)
Total Protein: 5.8 g/dL — ABNORMAL LOW (ref 6.0–8.5)

## 2016-07-10 ENCOUNTER — Telehealth: Payer: Self-pay | Admitting: Hematology and Oncology

## 2016-07-10 ENCOUNTER — Ambulatory Visit (HOSPITAL_BASED_OUTPATIENT_CLINIC_OR_DEPARTMENT_OTHER): Payer: Medicare Other

## 2016-07-10 ENCOUNTER — Ambulatory Visit: Payer: Medicare Other

## 2016-07-10 ENCOUNTER — Other Ambulatory Visit (HOSPITAL_BASED_OUTPATIENT_CLINIC_OR_DEPARTMENT_OTHER): Payer: Medicare Other

## 2016-07-10 VITALS — BP 152/85 | HR 62 | Temp 98.6°F | Resp 18

## 2016-07-10 DIAGNOSIS — C9002 Multiple myeloma in relapse: Secondary | ICD-10-CM

## 2016-07-10 DIAGNOSIS — C9001 Multiple myeloma in remission: Secondary | ICD-10-CM | POA: Diagnosis present

## 2016-07-10 DIAGNOSIS — Z5112 Encounter for antineoplastic immunotherapy: Secondary | ICD-10-CM

## 2016-07-10 LAB — COMPREHENSIVE METABOLIC PANEL
ALBUMIN: 3.3 g/dL — AB (ref 3.5–5.0)
ALT: 22 U/L (ref 0–55)
AST: 22 U/L (ref 5–34)
Alkaline Phosphatase: 98 U/L (ref 40–150)
Anion Gap: 7 mEq/L (ref 3–11)
BUN: 10 mg/dL (ref 7.0–26.0)
CO2: 23 meq/L (ref 22–29)
Calcium: 8.7 mg/dL (ref 8.4–10.4)
Chloride: 108 mEq/L (ref 98–109)
Creatinine: 0.6 mg/dL (ref 0.6–1.1)
GLUCOSE: 95 mg/dL (ref 70–140)
POTASSIUM: 4 meq/L (ref 3.5–5.1)
SODIUM: 138 meq/L (ref 136–145)
Total Bilirubin: 0.61 mg/dL (ref 0.20–1.20)
Total Protein: 5.8 g/dL — ABNORMAL LOW (ref 6.4–8.3)

## 2016-07-10 LAB — CBC WITH DIFFERENTIAL/PLATELET
BASO%: 1.6 % (ref 0.0–2.0)
Basophils Absolute: 0 10*3/uL (ref 0.0–0.1)
EOS ABS: 0.5 10*3/uL (ref 0.0–0.5)
EOS%: 22.7 % — ABNORMAL HIGH (ref 0.0–7.0)
HCT: 40 % (ref 34.8–46.6)
HEMOGLOBIN: 12.7 g/dL (ref 11.6–15.9)
LYMPH%: 21.2 % (ref 14.0–49.7)
MCH: 28.6 pg (ref 25.1–34.0)
MCHC: 31.8 g/dL (ref 31.5–36.0)
MCV: 90 fL (ref 79.5–101.0)
MONO#: 0.3 10*3/uL (ref 0.1–0.9)
MONO%: 13.8 % (ref 0.0–14.0)
NEUT%: 40.7 % (ref 38.4–76.8)
NEUTROS ABS: 0.8 10*3/uL — AB (ref 1.5–6.5)
Platelets: 107 10*3/uL — ABNORMAL LOW (ref 145–400)
RBC: 4.45 10*6/uL (ref 3.70–5.45)
RDW: 18.2 % — AB (ref 11.2–14.5)
WBC: 2.1 10*3/uL — AB (ref 3.9–10.3)
lymph#: 0.4 10*3/uL — ABNORMAL LOW (ref 0.9–3.3)

## 2016-07-10 MED ORDER — SODIUM CHLORIDE 0.9% FLUSH
10.0000 mL | INTRAVENOUS | Status: DC | PRN
  Administered 2016-07-10: 10 mL
  Filled 2016-07-10: qty 10

## 2016-07-10 MED ORDER — ACETAMINOPHEN 325 MG PO TABS
650.0000 mg | ORAL_TABLET | Freq: Once | ORAL | Status: AC
  Administered 2016-07-10: 650 mg via ORAL

## 2016-07-10 MED ORDER — PROCHLORPERAZINE MALEATE 10 MG PO TABS
ORAL_TABLET | ORAL | Status: AC
Start: 2016-07-10 — End: 2016-07-10
  Filled 2016-07-10: qty 1

## 2016-07-10 MED ORDER — HEPARIN SOD (PORK) LOCK FLUSH 100 UNIT/ML IV SOLN
500.0000 [IU] | Freq: Once | INTRAVENOUS | Status: DC | PRN
  Filled 2016-07-10: qty 5

## 2016-07-10 MED ORDER — PROCHLORPERAZINE MALEATE 10 MG PO TABS
10.0000 mg | ORAL_TABLET | Freq: Once | ORAL | Status: AC
  Administered 2016-07-10: 10 mg via ORAL

## 2016-07-10 MED ORDER — HEPARIN SOD (PORK) LOCK FLUSH 100 UNIT/ML IV SOLN
500.0000 [IU] | Freq: Once | INTRAVENOUS | Status: AC | PRN
  Administered 2016-07-10: 500 [IU]
  Filled 2016-07-10: qty 5

## 2016-07-10 MED ORDER — METHYLPREDNISOLONE SODIUM SUCC 125 MG IJ SOLR
INTRAMUSCULAR | Status: AC
  Filled 2016-07-10: qty 2

## 2016-07-10 MED ORDER — ACETAMINOPHEN 325 MG PO TABS
ORAL_TABLET | ORAL | Status: AC
  Filled 2016-07-10: qty 2

## 2016-07-10 MED ORDER — METHYLPREDNISOLONE SODIUM SUCC 125 MG IJ SOLR
125.0000 mg | Freq: Once | INTRAMUSCULAR | Status: AC
  Administered 2016-07-10: 125 mg via INTRAVENOUS

## 2016-07-10 MED ORDER — SODIUM CHLORIDE 0.9 % IV SOLN
15.8000 mg/kg | Freq: Once | INTRAVENOUS | Status: AC
  Administered 2016-07-10: 1100 mg via INTRAVENOUS
  Filled 2016-07-10: qty 40

## 2016-07-10 MED ORDER — ALTEPLASE 2 MG IJ SOLR
2.0000 mg | Freq: Once | INTRAMUSCULAR | Status: DC | PRN
  Filled 2016-07-10: qty 2

## 2016-07-10 MED ORDER — SODIUM CHLORIDE 0.9 % IV SOLN
Freq: Once | INTRAVENOUS | Status: AC
  Administered 2016-07-10: 13:00:00 via INTRAVENOUS

## 2016-07-10 MED ORDER — DIPHENHYDRAMINE HCL 25 MG PO CAPS
ORAL_CAPSULE | ORAL | Status: AC
  Filled 2016-07-10: qty 2

## 2016-07-10 MED ORDER — SODIUM CHLORIDE 0.9 % IJ SOLN
10.0000 mL | INTRAMUSCULAR | Status: DC | PRN
  Administered 2016-07-10: 10 mL via INTRAVENOUS
  Filled 2016-07-10: qty 10

## 2016-07-10 MED ORDER — DIPHENHYDRAMINE HCL 25 MG PO CAPS
50.0000 mg | ORAL_CAPSULE | Freq: Once | ORAL | Status: AC
  Administered 2016-07-10: 50 mg via ORAL

## 2016-07-10 NOTE — Progress Notes (Signed)
OK to treat with today's labs. Pt is to hold Pomalyst until next labs on 9/20. She will see Dr Alvy Bimler at that time

## 2016-07-10 NOTE — Telephone Encounter (Signed)
avs report and schd conf & given, per 07/01/16 los.

## 2016-07-10 NOTE — Patient Instructions (Signed)
[facility] Discharge Instructions for Patients Receiving Chemotherapy  Today you received the following chemotherapy agents:  Darzalex (daratumumab)  To help prevent nausea and vomiting after your treatment, we encourage you to take your nausea medication as prescribed.   If you develop nausea and vomiting that is not controlled by your nausea medication, call the clinic.   BELOW ARE SYMPTOMS THAT SHOULD BE REPORTED IMMEDIATELY:  *FEVER GREATER THAN 100.5 F  *CHILLS WITH OR WITHOUT FEVER  NAUSEA AND VOMITING THAT IS NOT CONTROLLED WITH YOUR NAUSEA MEDICATION  *UNUSUAL SHORTNESS OF BREATH  *UNUSUAL BRUISING OR BLEEDING  TENDERNESS IN MOUTH AND THROAT WITH OR WITHOUT PRESENCE OF ULCERS  *URINARY PROBLEMS  *BOWEL PROBLEMS  UNUSUAL RASH Items with * indicate a potential emergency and should be followed up as soon as possible.  Feel free to call the clinic you have any questions or concerns. The clinic phone number is [phone].  Please show the CHEMO ALERT CARD at check-in to the Emergency Department and triage nurse.   

## 2016-07-14 DIAGNOSIS — Z48814 Encounter for surgical aftercare following surgery on the teeth or oral cavity: Secondary | ICD-10-CM | POA: Diagnosis not present

## 2016-07-14 DIAGNOSIS — Z87898 Personal history of other specified conditions: Secondary | ICD-10-CM | POA: Diagnosis not present

## 2016-07-15 ENCOUNTER — Other Ambulatory Visit: Payer: Self-pay | Admitting: *Deleted

## 2016-07-15 DIAGNOSIS — D649 Anemia, unspecified: Secondary | ICD-10-CM

## 2016-07-15 DIAGNOSIS — C9001 Multiple myeloma in remission: Secondary | ICD-10-CM

## 2016-07-15 DIAGNOSIS — G894 Chronic pain syndrome: Secondary | ICD-10-CM | POA: Diagnosis not present

## 2016-07-15 DIAGNOSIS — M47812 Spondylosis without myelopathy or radiculopathy, cervical region: Secondary | ICD-10-CM | POA: Diagnosis not present

## 2016-07-15 DIAGNOSIS — Z79891 Long term (current) use of opiate analgesic: Secondary | ICD-10-CM | POA: Diagnosis not present

## 2016-07-15 DIAGNOSIS — D696 Thrombocytopenia, unspecified: Secondary | ICD-10-CM

## 2016-07-15 DIAGNOSIS — K5900 Constipation, unspecified: Secondary | ICD-10-CM | POA: Diagnosis not present

## 2016-07-15 MED ORDER — POMALIDOMIDE 3 MG PO CAPS: 3.0000 mg | ORAL_CAPSULE | Freq: Every day | ORAL | 0 refills | Status: DC

## 2016-07-22 ENCOUNTER — Ambulatory Visit (HOSPITAL_BASED_OUTPATIENT_CLINIC_OR_DEPARTMENT_OTHER): Payer: Medicare Other

## 2016-07-22 ENCOUNTER — Ambulatory Visit: Payer: Medicare Other

## 2016-07-22 ENCOUNTER — Other Ambulatory Visit: Payer: Self-pay | Admitting: Hematology and Oncology

## 2016-07-22 ENCOUNTER — Encounter: Payer: Self-pay | Admitting: Hematology and Oncology

## 2016-07-22 ENCOUNTER — Ambulatory Visit (HOSPITAL_BASED_OUTPATIENT_CLINIC_OR_DEPARTMENT_OTHER): Payer: Medicare Other | Admitting: Hematology and Oncology

## 2016-07-22 ENCOUNTER — Other Ambulatory Visit (HOSPITAL_BASED_OUTPATIENT_CLINIC_OR_DEPARTMENT_OTHER): Payer: Medicare Other

## 2016-07-22 ENCOUNTER — Telehealth: Payer: Self-pay | Admitting: Hematology and Oncology

## 2016-07-22 VITALS — BP 135/88 | HR 71 | Temp 98.7°F | Resp 18

## 2016-07-22 VITALS — BP 144/101 | HR 73 | Temp 98.6°F | Resp 18 | Ht 64.0 in | Wt 140.6 lb

## 2016-07-22 DIAGNOSIS — D649 Anemia, unspecified: Secondary | ICD-10-CM

## 2016-07-22 DIAGNOSIS — C9001 Multiple myeloma in remission: Secondary | ICD-10-CM | POA: Diagnosis not present

## 2016-07-22 DIAGNOSIS — D696 Thrombocytopenia, unspecified: Secondary | ICD-10-CM

## 2016-07-22 DIAGNOSIS — M545 Low back pain, unspecified: Secondary | ICD-10-CM

## 2016-07-22 DIAGNOSIS — I10 Essential (primary) hypertension: Secondary | ICD-10-CM

## 2016-07-22 DIAGNOSIS — D702 Other drug-induced agranulocytosis: Secondary | ICD-10-CM | POA: Diagnosis not present

## 2016-07-22 DIAGNOSIS — G8929 Other chronic pain: Secondary | ICD-10-CM

## 2016-07-22 DIAGNOSIS — Z5112 Encounter for antineoplastic immunotherapy: Secondary | ICD-10-CM | POA: Diagnosis present

## 2016-07-22 DIAGNOSIS — Z23 Encounter for immunization: Secondary | ICD-10-CM | POA: Diagnosis not present

## 2016-07-22 DIAGNOSIS — C9002 Multiple myeloma in relapse: Secondary | ICD-10-CM

## 2016-07-22 DIAGNOSIS — R110 Nausea: Secondary | ICD-10-CM

## 2016-07-22 DIAGNOSIS — E559 Vitamin D deficiency, unspecified: Secondary | ICD-10-CM | POA: Diagnosis not present

## 2016-07-22 LAB — CBC WITH DIFFERENTIAL/PLATELET
BASO%: 3.4 % — ABNORMAL HIGH (ref 0.0–2.0)
BASOS ABS: 0.1 10*3/uL (ref 0.0–0.1)
EOS ABS: 0.1 10*3/uL (ref 0.0–0.5)
EOS%: 4.1 % (ref 0.0–7.0)
HEMATOCRIT: 41.3 % (ref 34.8–46.6)
HEMOGLOBIN: 13.8 g/dL (ref 11.6–15.9)
LYMPH#: 0.8 10*3/uL — AB (ref 0.9–3.3)
LYMPH%: 27.1 % (ref 14.0–49.7)
MCH: 29.6 pg (ref 25.1–34.0)
MCHC: 33.4 g/dL (ref 31.5–36.0)
MCV: 88.6 fL (ref 79.5–101.0)
MONO#: 0.4 10*3/uL (ref 0.1–0.9)
MONO%: 14.7 % — ABNORMAL HIGH (ref 0.0–14.0)
NEUT%: 50.7 % (ref 38.4–76.8)
NEUTROS ABS: 1.5 10*3/uL (ref 1.5–6.5)
Platelets: 199 10*3/uL (ref 145–400)
RBC: 4.66 10*6/uL (ref 3.70–5.45)
RDW: 17.3 % — AB (ref 11.2–14.5)
WBC: 2.9 10*3/uL — AB (ref 3.9–10.3)

## 2016-07-22 LAB — COMPREHENSIVE METABOLIC PANEL
ALT: 24 U/L (ref 0–55)
ANION GAP: 10 meq/L (ref 3–11)
AST: 27 U/L (ref 5–34)
Albumin: 3.6 g/dL (ref 3.5–5.0)
Alkaline Phosphatase: 130 U/L (ref 40–150)
BUN: 10 mg/dL (ref 7.0–26.0)
CHLORIDE: 109 meq/L (ref 98–109)
CO2: 24 meq/L (ref 22–29)
Calcium: 9.5 mg/dL (ref 8.4–10.4)
Creatinine: 0.8 mg/dL (ref 0.6–1.1)
Glucose: 115 mg/dl (ref 70–140)
Potassium: 3.7 mEq/L (ref 3.5–5.1)
SODIUM: 142 meq/L (ref 136–145)
Total Bilirubin: 0.49 mg/dL (ref 0.20–1.20)
Total Protein: 6.3 g/dL — ABNORMAL LOW (ref 6.4–8.3)

## 2016-07-22 MED ORDER — VITAMIN D (ERGOCALCIFEROL) 1.25 MG (50000 UNIT) PO CAPS: 50000.0000 [IU] | ORAL_CAPSULE | ORAL | 3 refills | Status: AC

## 2016-07-22 MED ORDER — PROCHLORPERAZINE MALEATE 10 MG PO TABS
ORAL_TABLET | ORAL | Status: AC
  Filled 2016-07-22: qty 1

## 2016-07-22 MED ORDER — ACETAMINOPHEN 325 MG PO TABS
650.0000 mg | ORAL_TABLET | Freq: Once | ORAL | Status: AC
  Administered 2016-07-22: 650 mg via ORAL

## 2016-07-22 MED ORDER — ACETAMINOPHEN 325 MG PO TABS
ORAL_TABLET | ORAL | Status: AC
  Filled 2016-07-22: qty 2

## 2016-07-22 MED ORDER — SODIUM CHLORIDE 0.9 % IV SOLN
Freq: Once | INTRAVENOUS | Status: AC
  Administered 2016-07-22: 10:00:00 via INTRAVENOUS

## 2016-07-22 MED ORDER — SODIUM CHLORIDE 0.9% FLUSH
10.0000 mL | INTRAVENOUS | Status: DC | PRN
  Administered 2016-07-22: 10 mL
  Filled 2016-07-22: qty 10

## 2016-07-22 MED ORDER — SODIUM CHLORIDE 0.9 % IV SOLN
15.6000 mg/kg | Freq: Once | INTRAVENOUS | Status: AC
  Administered 2016-07-22: 1100 mg via INTRAVENOUS
  Filled 2016-07-22: qty 40

## 2016-07-22 MED ORDER — SODIUM CHLORIDE 0.9 % IJ SOLN
10.0000 mL | INTRAMUSCULAR | Status: DC | PRN
  Administered 2016-07-22: 10 mL via INTRAVENOUS
  Filled 2016-07-22: qty 10

## 2016-07-22 MED ORDER — HEPARIN SOD (PORK) LOCK FLUSH 100 UNIT/ML IV SOLN
500.0000 [IU] | Freq: Once | INTRAVENOUS | Status: AC | PRN
  Administered 2016-07-22: 500 [IU]
  Filled 2016-07-22: qty 5

## 2016-07-22 MED ORDER — PROCHLORPERAZINE MALEATE 10 MG PO TABS
10.0000 mg | ORAL_TABLET | Freq: Once | ORAL | Status: AC
  Administered 2016-07-22: 10 mg via ORAL

## 2016-07-22 MED ORDER — METHYLPREDNISOLONE SODIUM SUCC 125 MG IJ SOLR
125.0000 mg | Freq: Once | INTRAMUSCULAR | Status: AC
  Administered 2016-07-22: 125 mg via INTRAVENOUS

## 2016-07-22 MED ORDER — DIPHENHYDRAMINE HCL 25 MG PO CAPS
ORAL_CAPSULE | ORAL | Status: AC
  Filled 2016-07-22: qty 2

## 2016-07-22 MED ORDER — POMALIDOMIDE 2 MG PO CAPS: 2.0000 mg | ORAL_CAPSULE | Freq: Every day | ORAL | 9 refills | Status: DC

## 2016-07-22 MED ORDER — PROCHLORPERAZINE MALEATE 10 MG PO TABS: 10.0000 mg | ORAL_TABLET | Freq: Four times a day (QID) | ORAL | 3 refills | Status: AC | PRN

## 2016-07-22 MED ORDER — METHYLPREDNISOLONE SODIUM SUCC 125 MG IJ SOLR
INTRAMUSCULAR | Status: AC
  Filled 2016-07-22: qty 2

## 2016-07-22 MED ORDER — DIPHENHYDRAMINE HCL 25 MG PO CAPS
50.0000 mg | ORAL_CAPSULE | Freq: Once | ORAL | Status: AC
  Administered 2016-07-22: 50 mg via ORAL

## 2016-07-22 MED ORDER — INFLUENZA VAC SPLIT QUAD 0.5 ML IM SUSY
0.5000 mL | PREFILLED_SYRINGE | Freq: Once | INTRAMUSCULAR | Status: AC
  Administered 2016-07-22: 0.5 mL via INTRAMUSCULAR
  Filled 2016-07-22: qty 0.5

## 2016-07-22 NOTE — Assessment & Plan Note (Signed)
I reviewed the most recent blood work with her. Her SPEP is stable and she is responding well to treatment She has no recurrence of admission to the hospital. We will continue Pomalidomide for 21 days on, 7 days off. However, due to recent neutropenia, I plan to reduce the dose Pomalyst to 2 mg dose. With recent dental extraction, I recommend resuming Daratumumab After today's dose, I'll change her to a cycle every 21 days. I will redraw myeloma panel on 08/12/2016. I will see her back 09/02/2016 to review test results In the future, I will add Zometa after she has achieved adequate healing from recent dental extraction In the meantime, she will take calcium with vitamin D supplement

## 2016-07-22 NOTE — Patient Instructions (Signed)

## 2016-07-22 NOTE — Assessment & Plan Note (Signed)
This is related to side effects of treatment. I plan to reduce dose of Pomalyst as above

## 2016-07-22 NOTE — Progress Notes (Signed)
[facility] OFFICE PROGRESS NOTE  Patient Care Team: Marin Olp, MD as PCP - General (Family Medicine) Jeanann Lewandowsky, MD as Consulting Physician (Medical Oncology) Marin Olp, MD as Consulting Physician (Family Medicine) Heath Lark, MD as Consulting Physician (Hematology and Oncology) [name], MD as Consulting Physician (Orthopedic Surgery)  SUMMARY OF ONCOLOGIC HISTORY: Oncology History   Multiple myeloma, kappa light chain disease, Durie-Salmon stage III       Multiple myeloma in remission ([place])   07/16/2006 Bone Marrow Biopsy    BM biopsy is non-diagnostic      09/21/2006 Procedure    L5 vertebral biopsy 5% plasma cell      10/25/2006 Bone Marrow Biopsy    BM biopsy is hypercellular with 5% plasma cell      11/17/2006 Procedure    L5 biopsy confirmed plasmacytoma      01/03/2007 - 01/10/2007 Radiation Therapy    Approximate date only, received RT for plasmacytoma followed by surgery      09/14/2007 Initial Diagnosis    MULTIPLE  MYELOMA      10/05/2007 Bone Marrow Biopsy    Bm biopsy was negative      06/18/2008 Bone Marrow Biopsy    BM biopsy was negative      07/26/2008 Bone Marrow Transplant    Stem cell transplant at [facility]      04/13/2011 Relapse/Recurrence    Disease relapse      04/14/2011 Bone Marrow Biopsy    Bm biopsy showed 2 % plasma cell      04/27/2011 Relapse/Recurrence    Disease relapse, treated with Velcade/Cytoxan/Dex      09/07/2011 - 07/28/2013 Chemotherapy    She has been receiving Velcade      12/27/2011 Bone Marrow Transplant    2nd transplant at [facility]      07/28/2013 Relapse/Recurrence    Chemo is stopped due to progression of disease      08/29/2013 Imaging    PEt/CT showed recurrence of disease with new lesion on her rib with compression fracture      09/13/2013 Bone Marrow Biopsy    BM biopsy is hypercellular with 6% plasma cell      10/10/2013 - 10/20/2013 Radiation Therapy     Started on palliative XRT for rib pain      11/27/2013 - 04/06/2014 Chemotherapy    The patient starts chemotherapy with Carfilzomib      07/19/2014 Imaging    Repeat bloodwork and PET/CT scan shows significant disease progression.      08/02/2014 - 03/06/2015 Chemotherapy    She enrolled in clinical trial using combination therapy with Revlimid, dexamethasone and Elotuzumab      03/07/2015 - 06/04/2015 Chemotherapy    She is on maintenance Revlimid only without dexamethasone      05/27/2015 Imaging     MRI spine showed new compression fracture.      06/04/2015 - 06/18/2015 Radiation Therapy     she received palliative radiation therapy to 25 Gy C7-T1      07/10/2015 - 08/07/2015 Chemotherapy    She received Elotuzumab, dex and Revlimid. Rx is stopped due to progression      08/01/2015 Imaging    MRI of the spine was performed today due to new onset of worsening right hip pain/flank area. MRI show mild progression of the L1 vertebral body.      08/21/2015 - 08/26/2015 Hospital Admission    She was admitted to the hospital due to pathologic  fracture to right femur      08/22/2015 Surgery    She had IM nail to fractured femur      08/23/2015 - 09/20/2015 Radiation Therapy    She received XRT to her L1 L2 and Right Femur      09/05/2015 - 09/06/2015 Hospital Admission    She had recurrent admission due to displaced fracture, managed conservatively      09/12/2015 - 09/17/2015 Hospital Admission    She had recurrent admission for COPD exacerbation      09/30/2015 -  Chemotherapy    She was started on Pomalyst      12/10/2015 -  Chemotherapy    She started weekly Daratumumab along with Pomalyst       INTERVAL HISTORY: Please see below for problem oriented charting. She returns for further follow-up. She denies recent infection. Her gums are healing from recent dental extraction. She continues to have chronic back pain. She had recent nausea. She had chronic  constipation. No recent vomiting.  REVIEW OF SYSTEMS:   Constitutional: Denies fevers, chills or abnormal weight loss Eyes: Denies blurriness of vision Ears, nose, mouth, throat, and face: Denies mucositis or sore throat Respiratory: Denies cough, dyspnea or wheezes Cardiovascular: Denies palpitation, chest discomfort or lower extremity swelling Skin: Denies abnormal skin rashes Lymphatics: Denies new lymphadenopathy or easy bruising Neurological:Denies numbness, tingling or new weaknesses Behavioral/Psych: Mood is stable, no new changes  All other systems were reviewed with the patient and are negative.  I have reviewed the past medical history, past surgical history, social history and family history with the patient and they are unchanged from previous note.  ALLERGIES:  is allergic to codeine; ibuprofen; and albuterol.  MEDICATIONS:  Current Outpatient Prescriptions  Medication Sig Dispense Refill  . acyclovir (ZOVIRAX) 400 MG tablet TAKE 1 TABLET BY MOUTH EVERY DAY 30 tablet 6  . ALPRAZolam (XANAX) 0.25 MG tablet Take 1 tablet (0.25 mg total) by mouth at bedtime as needed for anxiety. 60 tablet 0  . aspirin 81 MG tablet Take 81 mg by mouth daily.    . chlorhexidine (PERIDEX) 0.12 % solution Rinse with 15 mls twice daily for 30 seconds. Use after breakfast and at bedtime. Spit out excess. Do not swallow. 480 mL prn  . cyclobenzaprine (FLEXERIL) 10 MG tablet Take 1 tablet (10 mg total) by mouth 3 (three) times daily as needed for muscle spasms. 15 tablet 0  . fluticasone-salmeterol (ADVAIR HFA) 115-21 MCG/ACT inhaler Inhale 2 puffs into the lungs 2 (two) times daily. 1 Inhaler 5  . gabapentin (NEURONTIN) 600 MG tablet Take 1 tablet (600 mg total) by mouth 3 (three) times daily. 90 tablet 3  . ipratropium (ATROVENT HFA) 17 MCG/ACT inhaler Inhale 2 puffs into the lungs every 4 (four) hours as needed for wheezing. 1 Inhaler 12  . levothyroxine (SYNTHROID, LEVOTHROID) 50 MCG tablet TAKE  1 TABLET (50 MCG TOTAL) BY MOUTH DAILY BEFORE BREAKFAST. 90 tablet 1  . lidocaine-prilocaine (EMLA) cream Apply 1 application topically as needed. 30 g 6  . losartan (COZAAR) 50 MG tablet TAKE 1 TABLET (50 MG TOTAL) BY MOUTH DAILY. 90 tablet 1  . morphine (MS CONTIN) 100 MG 12 hr tablet Take 100 mg by mouth every 8 (eight) hours.     Marland Kitchen oxyCODONE (ROXICODONE) 15 MG immediate release tablet Take 15 mg by mouth every 4 (four) hours as needed for pain.    . pomalidomide (POMALYST) 2 MG capsule Take 1 capsule (2 mg total) by  mouth daily. Take with water on days 1-21. Repeat every 28 days. 21 capsule 9  . prochlorperazine (COMPAZINE) 10 MG tablet Take 1 tablet (10 mg total) by mouth every 6 (six) hours as needed. for nausea 90 tablet 3  . senna-docusate (SENOKOT-S) 8.6-50 MG tablet Take 1 tablet by mouth daily.    . Vitamin D, Ergocalciferol, (DRISDOL) 50000 units CAPS capsule Take 1 capsule (50,000 Units total) by mouth every 7 (seven) days. 12 capsule 3   Current Facility-Administered Medications  Medication Dose Route Frequency Provider Last Rate Last Dose  . Influenza vac split quadrivalent PF (FLUARIX) injection 0.5 mL  0.5 mL Intramuscular Once Artis Delay, MD       Facility-Administered Medications Ordered in Other Visits  Medication Dose Route Frequency Provider Last Rate Last Dose  . sodium chloride 0.9 % injection 10 mL  10 mL Intravenous PRN Artis Delay, MD   10 mL at 02/19/16 0911    PHYSICAL EXAMINATION: ECOG PERFORMANCE STATUS: 1 - Symptomatic but completely ambulatory  Vitals:   07/22/16 0856  BP: (!) 144/101  Pulse: 73  Resp: 18  Temp: 98.6 F (37 C)   Filed Weights   07/22/16 0856  Weight: 140 lb 9.6 oz (63.8 kg)    GENERAL:alert, no distress and comfortable SKIN: skin color, texture, turgor are normal, no rashes or significant lesions EYES: normal, Conjunctiva are pink and non-injected, sclera clear OROPHARYNX:no exudate, no erythema and lips, buccal mucosa, and tongue  normal  NECK: supple, thyroid normal size, non-tender, without nodularity LYMPH:  no palpable lymphadenopathy in the cervical, axillary or inguinal LUNGS: clear to auscultation and percussion with normal breathing effort HEART: regular rate & rhythm and no murmurs and no lower extremity edema ABDOMEN:abdomen soft, non-tender and normal bowel sounds Musculoskeletal:no cyanosis of digits and no clubbing  NEURO: alert & oriented x 3 with fluent speech, no focal motor/sensory deficits  LABORATORY DATA:  I have reviewed the data as listed    Component Value Date/Time   NA 138 07/10/2016 1148   K 4.0 07/10/2016 1148   CL 107 09/11/2015 2255   CL 107 03/10/2013 1014   CO2 23 07/10/2016 1148   GLUCOSE 95 07/10/2016 1148   GLUCOSE 111 (H) 03/10/2013 1014   BUN 10.0 07/10/2016 1148   CREATININE 0.6 07/10/2016 1148   CALCIUM 8.7 07/10/2016 1148   PROT 5.8 (L) 07/10/2016 1148   ALBUMIN 3.3 (L) 07/10/2016 1148   AST 22 07/10/2016 1148   ALT 22 07/10/2016 1148   ALKPHOS 98 07/10/2016 1148   BILITOT 0.61 07/10/2016 1148   GFRNONAA >60 09/12/2015 0620   GFRAA >60 09/12/2015 0620    No results found for: SPEP, UPEP  Lab Results  Component Value Date   WBC 2.9 (L) 07/22/2016   NEUTROABS 1.5 07/22/2016   HGB 13.8 07/22/2016   HCT 41.3 07/22/2016   MCV 88.6 07/22/2016   PLT 199 07/22/2016      Chemistry      Component Value Date/Time   NA 138 07/10/2016 1148   K 4.0 07/10/2016 1148   CL 107 09/11/2015 2255   CL 107 03/10/2013 1014   CO2 23 07/10/2016 1148   BUN 10.0 07/10/2016 1148   CREATININE 0.6 07/10/2016 1148      Component Value Date/Time   CALCIUM 8.7 07/10/2016 1148   ALKPHOS 98 07/10/2016 1148   AST 22 07/10/2016 1148   ALT 22 07/10/2016 1148   BILITOT 0.61 07/10/2016 1148     ASSESSMENT &  PLAN:  Multiple myeloma in remission ([place]) I reviewed the most recent blood work with her. Her SPEP is stable and she is responding well to treatment She has no recurrence  of admission to the hospital. We will continue Pomalidomide for 21 days on, 7 days off. However, due to recent neutropenia, I plan to reduce the dose Pomalyst to 2 mg dose. With recent dental extraction, I recommend resuming Daratumumab After today's dose, I'll change her to a cycle every 21 days. I will redraw myeloma panel on 08/12/2016. I will see her back 09/02/2016 to review test results In the future, I will add Zometa after she has achieved adequate healing from recent dental extraction In the meantime, she will take calcium with vitamin D supplement  Vitamin D deficiency She will continue high-dose vitamin D supplements. I refilled her prescription today  Drug-induced neutropenia ([place]) This is related to side effects of treatment. I plan to reduce dose of Pomalyst as above  Essential hypertension she will continue current medical management. I recommend close follow-up with primary care doctor for medication adjustment.   Bilateral low back pain without sciatica She will continue pain management through the pain clinic.  Nausea without vomiting Unlikely due to chemotherapy. I refill her prescription Compazine. She has an element of chronic constipation and I recommended increase laxatives therapy.   No orders of the defined types were placed in this encounter.  All questions were answered. The patient knows to call the clinic with any problems, questions or concerns. No barriers to learning was detected. I spent 25 minutes counseling the patient face to face. The total time spent in the appointment was 30 minutes and more than 50% was on counseling and review of test results     [facility], [place], MD 07/22/2016 9:17 AM

## 2016-07-22 NOTE — Telephone Encounter (Signed)
Appointments complete per 9/20 los. Patient to get print out in infusion area.

## 2016-07-22 NOTE — Patient Instructions (Addendum)
[facility] Discharge Instructions for Patients Receiving Chemotherapy  Dr. Alvy Bimler sent new orders to scheduling today.   EXPECT CALL FROM SCHEDULING TO ADD APPOINTMENTS FOR DARATUMUMAB.   10/4- LAB/FLUSH/CHEMO 10/18- LAB/FLUSH/CHEMO 11/1- LAB/FLUSH/CHEMO (already scheduled)  DELETE THE APPOINTMENT ON 10/11   Today you received the following chemotherapy agents:  Darzalex (daratumumab)  To help prevent nausea and vomiting after your treatment, we encourage you to take your nausea medication as prescribed.   If you develop nausea and vomiting that is not controlled by your nausea medication, call the clinic.   BELOW ARE SYMPTOMS THAT SHOULD BE REPORTED IMMEDIATELY:  *FEVER GREATER THAN 100.5 F  *CHILLS WITH OR WITHOUT FEVER  NAUSEA AND VOMITING THAT IS NOT CONTROLLED WITH YOUR NAUSEA MEDICATION  *UNUSUAL SHORTNESS OF BREATH  *UNUSUAL BRUISING OR BLEEDING  TENDERNESS IN MOUTH AND THROAT WITH OR WITHOUT PRESENCE OF ULCERS  *URINARY PROBLEMS  *BOWEL PROBLEMS  UNUSUAL RASH Items with * indicate a potential emergency and should be followed up as soon as possible.  Feel free to call the clinic you have any questions or concerns. The clinic phone number is [phone].  Please show the [place] at check-in to the Emergency Department and triage nurse.

## 2016-07-22 NOTE — Assessment & Plan Note (Signed)
She will continue high-dose vitamin D supplements. I refilled her prescription today

## 2016-07-22 NOTE — Assessment & Plan Note (Signed)
Unlikely due to chemotherapy. I refill her prescription Compazine. She has an element of chronic constipation and I recommended increase laxatives therapy.

## 2016-07-22 NOTE — Assessment & Plan Note (Signed)
She will continue pain management through the pain clinic.

## 2016-07-22 NOTE — Assessment & Plan Note (Signed)
she will continue current medical management. I recommend close follow-up with primary care doctor for medication adjustment.  

## 2016-07-23 ENCOUNTER — Other Ambulatory Visit: Payer: Self-pay | Admitting: *Deleted

## 2016-07-23 DIAGNOSIS — D649 Anemia, unspecified: Secondary | ICD-10-CM

## 2016-07-23 DIAGNOSIS — D696 Thrombocytopenia, unspecified: Secondary | ICD-10-CM

## 2016-07-23 DIAGNOSIS — C9001 Multiple myeloma in remission: Secondary | ICD-10-CM

## 2016-07-23 MED ORDER — POMALIDOMIDE 2 MG PO CAPS: 2.0000 mg | ORAL_CAPSULE | Freq: Every day | ORAL | 9 refills | Status: DC

## 2016-07-23 NOTE — Telephone Encounter (Signed)
Notified [facility] of dose decrease Pomalyst from 3 mg to 2 mg so they can update dose under current Auth number. The previous Rx for 3 mg had not been filled yet.  Sent New Rx for 2 mg to [facility] electronically.

## 2016-07-24 ENCOUNTER — Telehealth: Payer: Self-pay | Admitting: Hematology and Oncology

## 2016-07-24 NOTE — Telephone Encounter (Signed)
lvm to inform pt of 10/4 appt date/time per LOS

## 2016-08-05 ENCOUNTER — Ambulatory Visit: Payer: Medicare Other

## 2016-08-05 ENCOUNTER — Ambulatory Visit (HOSPITAL_BASED_OUTPATIENT_CLINIC_OR_DEPARTMENT_OTHER): Payer: Medicare Other

## 2016-08-05 ENCOUNTER — Other Ambulatory Visit (HOSPITAL_BASED_OUTPATIENT_CLINIC_OR_DEPARTMENT_OTHER): Payer: Medicare Other

## 2016-08-05 VITALS — BP 139/92 | HR 74 | Temp 98.6°F | Resp 18

## 2016-08-05 DIAGNOSIS — C9001 Multiple myeloma in remission: Secondary | ICD-10-CM | POA: Diagnosis present

## 2016-08-05 DIAGNOSIS — C9002 Multiple myeloma in relapse: Secondary | ICD-10-CM

## 2016-08-05 DIAGNOSIS — Z5112 Encounter for antineoplastic immunotherapy: Secondary | ICD-10-CM | POA: Diagnosis present

## 2016-08-05 LAB — COMPREHENSIVE METABOLIC PANEL
ALT: 29 U/L (ref 0–55)
AST: 36 U/L — AB (ref 5–34)
Albumin: 3.5 g/dL (ref 3.5–5.0)
Alkaline Phosphatase: 115 U/L (ref 40–150)
Anion Gap: 9 mEq/L (ref 3–11)
BILIRUBIN TOTAL: 0.44 mg/dL (ref 0.20–1.20)
BUN: 11.6 mg/dL (ref 7.0–26.0)
CHLORIDE: 109 meq/L (ref 98–109)
CO2: 22 meq/L (ref 22–29)
CREATININE: 0.7 mg/dL (ref 0.6–1.1)
Calcium: 8.8 mg/dL (ref 8.4–10.4)
EGFR: >90 mL/min/{1.73_m2} (ref >90)
GLUCOSE: 98 mg/dL (ref 70–140)
Potassium: 3.9 mEq/L (ref 3.5–5.1)
Sodium: 139 mEq/L (ref 136–145)
TOTAL PROTEIN: 6 g/dL — AB (ref 6.4–8.3)

## 2016-08-05 LAB — CBC WITH DIFFERENTIAL/PLATELET
BASO%: 0.5 % (ref 0.0–2.0)
Basophils Absolute: 0 10*3/uL (ref 0.0–0.1)
EOS%: 13.5 % — AB (ref 0.0–7.0)
Eosinophils Absolute: 0.5 10*3/uL (ref 0.0–0.5)
HEMATOCRIT: 41.3 % (ref 34.8–46.6)
HGB: 13.7 g/dL (ref 11.6–15.9)
LYMPH#: 0.5 10*3/uL — AB (ref 0.9–3.3)
LYMPH%: 13.2 % — AB (ref 14.0–49.7)
MCH: 29.5 pg (ref 25.1–34.0)
MCHC: 33.2 g/dL (ref 31.5–36.0)
MCV: 88.8 fL (ref 79.5–101.0)
MONO#: 0.3 10*3/uL (ref 0.1–0.9)
MONO%: 8.1 % (ref 0.0–14.0)
NEUT%: 64.7 % (ref 38.4–76.8)
NEUTROS ABS: 2.5 10*3/uL (ref 1.5–6.5)
Platelets: 135 10*3/uL — ABNORMAL LOW (ref 145–400)
RBC: 4.65 10*6/uL (ref 3.70–5.45)
RDW: 16.9 % — ABNORMAL HIGH (ref 11.2–14.5)
WBC: 3.9 10*3/uL (ref 3.9–10.3)

## 2016-08-05 MED ORDER — SODIUM CHLORIDE 0.9 % IV SOLN
15.8000 mg/kg | Freq: Once | INTRAVENOUS | Status: AC
  Administered 2016-08-05: 1100 mg via INTRAVENOUS
  Filled 2016-08-05: qty 40

## 2016-08-05 MED ORDER — DIPHENHYDRAMINE HCL 25 MG PO CAPS
50.0000 mg | ORAL_CAPSULE | Freq: Once | ORAL | Status: AC
  Administered 2016-08-05: 50 mg via ORAL

## 2016-08-05 MED ORDER — ACETAMINOPHEN 325 MG PO TABS
ORAL_TABLET | ORAL | Status: AC
  Filled 2016-08-05: qty 2

## 2016-08-05 MED ORDER — METHYLPREDNISOLONE SODIUM SUCC 125 MG IJ SOLR
INTRAMUSCULAR | Status: AC
  Filled 2016-08-05: qty 2

## 2016-08-05 MED ORDER — HYDROMORPHONE HCL 4 MG/ML IJ SOLN
2.0000 mg | INTRAMUSCULAR | Status: DC | PRN
  Administered 2016-08-05: 2 mg via INTRAVENOUS

## 2016-08-05 MED ORDER — HYDROMORPHONE HCL 4 MG/ML IJ SOLN
INTRAMUSCULAR | Status: AC
  Filled 2016-08-05: qty 1

## 2016-08-05 MED ORDER — METHYLPREDNISOLONE SODIUM SUCC 125 MG IJ SOLR
125.0000 mg | Freq: Once | INTRAMUSCULAR | Status: AC
  Administered 2016-08-05: 125 mg via INTRAVENOUS

## 2016-08-05 MED ORDER — ACETAMINOPHEN 325 MG PO TABS
650.0000 mg | ORAL_TABLET | Freq: Once | ORAL | Status: AC
  Administered 2016-08-05: 650 mg via ORAL

## 2016-08-05 MED ORDER — PROCHLORPERAZINE MALEATE 10 MG PO TABS
10.0000 mg | ORAL_TABLET | Freq: Once | ORAL | Status: AC
  Administered 2016-08-05: 10 mg via ORAL

## 2016-08-05 MED ORDER — SODIUM CHLORIDE 0.9% FLUSH
10.0000 mL | INTRAVENOUS | Status: DC | PRN
  Administered 2016-08-05: 10 mL
  Filled 2016-08-05: qty 10

## 2016-08-05 MED ORDER — HEPARIN SOD (PORK) LOCK FLUSH 100 UNIT/ML IV SOLN
500.0000 [IU] | Freq: Once | INTRAVENOUS | Status: AC | PRN
  Administered 2016-08-05: 500 [IU]
  Filled 2016-08-05: qty 5

## 2016-08-05 MED ORDER — PROCHLORPERAZINE MALEATE 10 MG PO TABS
ORAL_TABLET | ORAL | Status: AC
Start: 2016-08-05 — End: 2016-08-05
  Filled 2016-08-05: qty 1

## 2016-08-05 MED ORDER — SODIUM CHLORIDE 0.9 % IV SOLN
Freq: Once | INTRAVENOUS | Status: AC
  Administered 2016-08-05: 12:00:00 via INTRAVENOUS

## 2016-08-05 MED ORDER — SODIUM CHLORIDE 0.9 % IJ SOLN
10.0000 mL | INTRAMUSCULAR | Status: DC | PRN
  Administered 2016-08-05: 10 mL via INTRAVENOUS
  Filled 2016-08-05: qty 10

## 2016-08-05 MED ORDER — DIPHENHYDRAMINE HCL 25 MG PO CAPS
ORAL_CAPSULE | ORAL | Status: AC
  Filled 2016-08-05: qty 2

## 2016-08-05 NOTE — Patient Instructions (Signed)
[facility] Discharge Instructions for Patients Receiving Chemotherapy  Today you received the following Immunotherapy agents;  Daratumumab.   BELOW ARE SYMPTOMS THAT SHOULD BE REPORTED IMMEDIATELY:  *FEVER GREATER THAN 100.5 F  *CHILLS WITH OR WITHOUT FEVER  NAUSEA AND VOMITING THAT IS NOT CONTROLLED WITH YOUR NAUSEA MEDICATION  *UNUSUAL SHORTNESS OF BREATH  *UNUSUAL BRUISING OR BLEEDING  TENDERNESS IN MOUTH AND THROAT WITH OR WITHOUT PRESENCE OF ULCERS  *URINARY PROBLEMS  *BOWEL PROBLEMS  UNUSUAL RASH Items with * indicate a potential emergency and should be followed up as soon as possible.  Feel free to call the clinic you have any questions or concerns. The clinic phone number is [phone].  Please show the [place] at check-in to the Emergency Department and triage nurse.

## 2016-08-11 DIAGNOSIS — K5900 Constipation, unspecified: Secondary | ICD-10-CM | POA: Diagnosis not present

## 2016-08-11 DIAGNOSIS — Z79891 Long term (current) use of opiate analgesic: Secondary | ICD-10-CM | POA: Diagnosis not present

## 2016-08-11 DIAGNOSIS — M47812 Spondylosis without myelopathy or radiculopathy, cervical region: Secondary | ICD-10-CM | POA: Diagnosis not present

## 2016-08-11 DIAGNOSIS — G894 Chronic pain syndrome: Secondary | ICD-10-CM | POA: Diagnosis not present

## 2016-08-12 ENCOUNTER — Other Ambulatory Visit: Payer: Self-pay

## 2016-08-19 ENCOUNTER — Ambulatory Visit (HOSPITAL_BASED_OUTPATIENT_CLINIC_OR_DEPARTMENT_OTHER): Payer: Medicare Other

## 2016-08-19 ENCOUNTER — Ambulatory Visit: Payer: Medicare Other

## 2016-08-19 ENCOUNTER — Other Ambulatory Visit (HOSPITAL_BASED_OUTPATIENT_CLINIC_OR_DEPARTMENT_OTHER): Payer: Medicare Other

## 2016-08-19 VITALS — BP 125/75 | HR 71 | Temp 97.8°F | Resp 16

## 2016-08-19 DIAGNOSIS — C9001 Multiple myeloma in remission: Secondary | ICD-10-CM

## 2016-08-19 DIAGNOSIS — Z5112 Encounter for antineoplastic immunotherapy: Secondary | ICD-10-CM

## 2016-08-19 DIAGNOSIS — C9002 Multiple myeloma in relapse: Secondary | ICD-10-CM

## 2016-08-19 LAB — CBC WITH DIFFERENTIAL/PLATELET
BASO%: 2 % (ref 0.0–2.0)
BASOS ABS: 0 10*3/uL (ref 0.0–0.1)
EOS ABS: 0.3 10*3/uL (ref 0.0–0.5)
EOS%: 13.7 % — ABNORMAL HIGH (ref 0.0–7.0)
HEMATOCRIT: 42.6 % (ref 34.8–46.6)
HEMOGLOBIN: 14.2 g/dL (ref 11.6–15.9)
LYMPH#: 0.5 10*3/uL — AB (ref 0.9–3.3)
LYMPH%: 25.4 % (ref 14.0–49.7)
MCH: 29.9 pg (ref 25.1–34.0)
MCHC: 33.3 g/dL (ref 31.5–36.0)
MCV: 89.7 fL (ref 79.5–101.0)
MONO#: 0.3 10*3/uL (ref 0.1–0.9)
MONO%: 16.2 % — ABNORMAL HIGH (ref 0.0–14.0)
NEUT#: 0.8 10*3/uL — ABNORMAL LOW (ref 1.5–6.5)
NEUT%: 42.7 % (ref 38.4–76.8)
PLATELETS: 154 10*3/uL (ref 145–400)
RBC: 4.75 10*6/uL (ref 3.70–5.45)
RDW: 16.4 % — AB (ref 11.2–14.5)
WBC: 2 10*3/uL — ABNORMAL LOW (ref 3.9–10.3)

## 2016-08-19 LAB — COMPREHENSIVE METABOLIC PANEL
ALBUMIN: 3.6 g/dL (ref 3.5–5.0)
ALK PHOS: 114 U/L (ref 40–150)
ALT: 35 U/L (ref 0–55)
ANION GAP: 8 meq/L (ref 3–11)
AST: 37 U/L — AB (ref 5–34)
BUN: 7.1 mg/dL (ref 7.0–26.0)
CALCIUM: 9.3 mg/dL (ref 8.4–10.4)
CO2: 23 mEq/L (ref 22–29)
Chloride: 108 mEq/L (ref 98–109)
Creatinine: 0.8 mg/dL (ref 0.6–1.1)
EGFR: >90 mL/min/{1.73_m2} (ref >90)
Glucose: 98 mg/dl (ref 70–140)
POTASSIUM: 4 meq/L (ref 3.5–5.1)
Sodium: 139 mEq/L (ref 136–145)
Total Bilirubin: 0.43 mg/dL (ref 0.20–1.20)
Total Protein: 6.4 g/dL (ref 6.4–8.3)

## 2016-08-19 MED ORDER — METHYLPREDNISOLONE SODIUM SUCC 125 MG IJ SOLR
125.0000 mg | Freq: Once | INTRAMUSCULAR | Status: AC
  Administered 2016-08-19: 125 mg via INTRAVENOUS

## 2016-08-19 MED ORDER — SODIUM CHLORIDE 0.9% FLUSH
10.0000 mL | INTRAVENOUS | Status: DC | PRN
  Administered 2016-08-19: 10 mL
  Filled 2016-08-19: qty 10

## 2016-08-19 MED ORDER — PROCHLORPERAZINE MALEATE 10 MG PO TABS
ORAL_TABLET | ORAL | Status: AC
  Filled 2016-08-19: qty 1

## 2016-08-19 MED ORDER — SODIUM CHLORIDE 0.9 % IJ SOLN
10.0000 mL | INTRAMUSCULAR | Status: DC | PRN
  Administered 2016-08-19: 10 mL via INTRAVENOUS
  Filled 2016-08-19: qty 10

## 2016-08-19 MED ORDER — DIPHENHYDRAMINE HCL 25 MG PO CAPS
50.0000 mg | ORAL_CAPSULE | Freq: Once | ORAL | Status: AC
  Administered 2016-08-19: 50 mg via ORAL

## 2016-08-19 MED ORDER — ACETAMINOPHEN 325 MG PO TABS
ORAL_TABLET | ORAL | Status: AC
  Filled 2016-08-19: qty 2

## 2016-08-19 MED ORDER — SODIUM CHLORIDE 0.9 % IV SOLN
15.6000 mg/kg | Freq: Once | INTRAVENOUS | Status: AC
  Administered 2016-08-19: 1100 mg via INTRAVENOUS
  Filled 2016-08-19: qty 40

## 2016-08-19 MED ORDER — ACETAMINOPHEN 325 MG PO TABS
650.0000 mg | ORAL_TABLET | Freq: Once | ORAL | Status: AC
  Administered 2016-08-19: 650 mg via ORAL

## 2016-08-19 MED ORDER — SODIUM CHLORIDE 0.9 % IV SOLN
Freq: Once | INTRAVENOUS | Status: AC
  Administered 2016-08-19: 12:00:00 via INTRAVENOUS

## 2016-08-19 MED ORDER — PROCHLORPERAZINE MALEATE 10 MG PO TABS
10.0000 mg | ORAL_TABLET | Freq: Once | ORAL | Status: AC
  Administered 2016-08-19: 10 mg via ORAL

## 2016-08-19 MED ORDER — HEPARIN SOD (PORK) LOCK FLUSH 100 UNIT/ML IV SOLN
500.0000 [IU] | Freq: Once | INTRAVENOUS | Status: AC | PRN
  Administered 2016-08-19: 500 [IU]
  Filled 2016-08-19: qty 5

## 2016-08-19 MED ORDER — DIPHENHYDRAMINE HCL 25 MG PO CAPS
ORAL_CAPSULE | ORAL | Status: AC
  Filled 2016-08-19: qty 2

## 2016-08-19 MED ORDER — METHYLPREDNISOLONE SODIUM SUCC 125 MG IJ SOLR
INTRAMUSCULAR | Status: AC
  Filled 2016-08-19: qty 2

## 2016-08-19 NOTE — Patient Instructions (Signed)
[facility] Discharge Instructions for Patients Receiving Chemotherapy  Today you received the following chemotherapy agents Darzalex.  To help prevent nausea and vomiting after your treatment, we encourage you to take your nausea medication as directed.  If you develop nausea and vomiting that is not controlled by your nausea medication, call the clinic.   BELOW ARE SYMPTOMS THAT SHOULD BE REPORTED IMMEDIATELY:  *FEVER GREATER THAN 100.5 F  *CHILLS WITH OR WITHOUT FEVER  NAUSEA AND VOMITING THAT IS NOT CONTROLLED WITH YOUR NAUSEA MEDICATION  *UNUSUAL SHORTNESS OF BREATH  *UNUSUAL BRUISING OR BLEEDING  TENDERNESS IN MOUTH AND THROAT WITH OR WITHOUT PRESENCE OF ULCERS  *URINARY PROBLEMS  *BOWEL PROBLEMS  UNUSUAL RASH Items with * indicate a potential emergency and should be followed up as soon as possible.  Feel free to call the clinic you have any questions or concerns. The clinic phone number is [phone].  Please show the CHEMO ALERT CARD at check-in to the Emergency Department and triage nurse.    

## 2016-08-19 NOTE — Progress Notes (Signed)
Per Tammi RN per Dr. Alvy Bimler okay to treat with WBC 2.0 and ANC 0.8. Pt to hold Pomalyst at home until pt sees DR gorsuch on Nov. 1st per Tammi RN per Dr. Alvy Bimler. Pt aware and verbalizes understanding.

## 2016-08-20 LAB — KAPPA/LAMBDA LIGHT CHAINS
IG KAPPA FREE LIGHT CHAIN: 59.4 mg/L — AB (ref 3.3–19.4)
IG LAMBDA FREE LIGHT CHAIN: 7.7 mg/L (ref 5.7–26.3)
Kappa/Lambda FluidC Ratio: 7.71 — ABNORMAL HIGH (ref 0.26–1.65)

## 2016-08-24 LAB — MULTIPLE MYELOMA PANEL, SERUM
ALBUMIN SERPL ELPH-MCNC: 3.4 g/dL (ref 2.9–4.4)
ALBUMIN/GLOB SERPL: 1.4 (ref 0.7–1.7)
ALPHA 1: 0.3 g/dL (ref 0.0–0.4)
ALPHA2 GLOB SERPL ELPH-MCNC: 0.7 g/dL (ref 0.4–1.0)
B-Globulin SerPl Elph-Mcnc: 0.9 g/dL (ref 0.7–1.3)
Gamma Glob SerPl Elph-Mcnc: 0.5 g/dL (ref 0.4–1.8)
Globulin, Total: 2.5 g/dL (ref 2.2–3.9)
IGA/IMMUNOGLOBULIN A, SERUM: 22 mg/dL — AB (ref 87–352)
IGG (IMMUNOGLOBIN G), SERUM: 412 mg/dL — AB (ref 700–1600)
IGM (IMMUNOGLOBIN M), SRM: 16 mg/dL — AB (ref 26–217)
M Protein SerPl Elph-Mcnc: 0.1 g/dL — ABNORMAL HIGH
TOTAL PROTEIN: 5.9 g/dL — AB (ref 6.0–8.5)

## 2016-08-28 ENCOUNTER — Other Ambulatory Visit: Payer: Self-pay | Admitting: *Deleted

## 2016-08-28 DIAGNOSIS — D696 Thrombocytopenia, unspecified: Secondary | ICD-10-CM

## 2016-08-28 DIAGNOSIS — C9001 Multiple myeloma in remission: Secondary | ICD-10-CM

## 2016-08-28 MED ORDER — POMALIDOMIDE 2 MG PO CAPS: 2.0000 mg | ORAL_CAPSULE | Freq: Every day | ORAL | 9 refills | Status: DC

## 2016-09-02 ENCOUNTER — Ambulatory Visit (HOSPITAL_COMMUNITY)
Admission: RE | Admit: 2016-09-02 | Discharge: 2016-09-02 | Disposition: A | Discharge status: Home / Self Care | Payer: Medicare Other | Source: Ambulatory Visit | Attending: Hematology and Oncology | Admitting: Hematology and Oncology

## 2016-09-02 ENCOUNTER — Encounter: Payer: Self-pay | Admitting: Hematology and Oncology

## 2016-09-02 ENCOUNTER — Telehealth: Payer: Self-pay | Admitting: Hematology and Oncology

## 2016-09-02 ENCOUNTER — Ambulatory Visit (HOSPITAL_BASED_OUTPATIENT_CLINIC_OR_DEPARTMENT_OTHER): Payer: Medicare Other

## 2016-09-02 ENCOUNTER — Ambulatory Visit: Payer: Medicare Other

## 2016-09-02 ENCOUNTER — Other Ambulatory Visit (HOSPITAL_BASED_OUTPATIENT_CLINIC_OR_DEPARTMENT_OTHER): Payer: Medicare Other

## 2016-09-02 ENCOUNTER — Ambulatory Visit (HOSPITAL_BASED_OUTPATIENT_CLINIC_OR_DEPARTMENT_OTHER): Payer: Medicare Other | Admitting: Hematology and Oncology

## 2016-09-02 ENCOUNTER — Telehealth: Payer: Self-pay | Admitting: *Deleted

## 2016-09-02 VITALS — BP 158/95 | HR 78 | Temp 98.5°F | Resp 18 | Ht 64.0 in | Wt 142.8 lb

## 2016-09-02 VITALS — BP 129/88 | HR 77 | Temp 98.9°F | Resp 18

## 2016-09-02 DIAGNOSIS — Z720 Tobacco use: Secondary | ICD-10-CM

## 2016-09-02 DIAGNOSIS — E559 Vitamin D deficiency, unspecified: Secondary | ICD-10-CM

## 2016-09-02 DIAGNOSIS — Z5112 Encounter for antineoplastic immunotherapy: Secondary | ICD-10-CM | POA: Diagnosis present

## 2016-09-02 DIAGNOSIS — F17210 Nicotine dependence, cigarettes, uncomplicated: Secondary | ICD-10-CM

## 2016-09-02 DIAGNOSIS — C9001 Multiple myeloma in remission: Secondary | ICD-10-CM

## 2016-09-02 DIAGNOSIS — I10 Essential (primary) hypertension: Secondary | ICD-10-CM | POA: Diagnosis not present

## 2016-09-02 DIAGNOSIS — C9002 Multiple myeloma in relapse: Secondary | ICD-10-CM

## 2016-09-02 LAB — CBC WITH DIFFERENTIAL/PLATELET
BASO%: 4.2 % — ABNORMAL HIGH (ref 0.0–2.0)
BASOS ABS: 0.2 10*3/uL — AB (ref 0.0–0.1)
EOS ABS: 0.1 10*3/uL (ref 0.0–0.5)
EOS%: 3 % (ref 0.0–7.0)
HEMATOCRIT: 42.7 % (ref 34.8–46.6)
HGB: 14.2 g/dL (ref 11.6–15.9)
LYMPH#: 0.8 10*3/uL — AB (ref 0.9–3.3)
LYMPH%: 19.1 % (ref 14.0–49.7)
MCH: 29.8 pg (ref 25.1–34.0)
MCHC: 33.2 g/dL (ref 31.5–36.0)
MCV: 89.7 fL (ref 79.5–101.0)
MONO#: 0.3 10*3/uL (ref 0.1–0.9)
MONO%: 7.5 % (ref 0.0–14.0)
NEUT%: 66.2 % (ref 38.4–76.8)
NEUTROS ABS: 2.7 10*3/uL (ref 1.5–6.5)
Platelets: 183 10*3/uL (ref 145–400)
RBC: 4.76 10*6/uL (ref 3.70–5.45)
RDW: 17.5 % — AB (ref 11.2–14.5)
WBC: 4 10*3/uL (ref 3.9–10.3)

## 2016-09-02 LAB — COMPREHENSIVE METABOLIC PANEL
ALBUMIN: 3.5 g/dL (ref 3.5–5.0)
ALK PHOS: 126 U/L (ref 40–150)
ALT: 27 U/L (ref 0–55)
AST: 34 U/L (ref 5–34)
Anion Gap: 7 mEq/L (ref 3–11)
BUN: 10.1 mg/dL (ref 7.0–26.0)
CALCIUM: 9.4 mg/dL (ref 8.4–10.4)
CO2: 24 mEq/L (ref 22–29)
CREATININE: 0.8 mg/dL (ref 0.6–1.1)
Chloride: 109 mEq/L (ref 98–109)
EGFR: >90 mL/min/{1.73_m2} (ref >90)
GLUCOSE: 95 mg/dL (ref 70–140)
POTASSIUM: 3.9 meq/L (ref 3.5–5.1)
SODIUM: 140 meq/L (ref 136–145)
TOTAL PROTEIN: 6.3 g/dL — AB (ref 6.4–8.3)
Total Bilirubin: 0.52 mg/dL (ref 0.20–1.20)

## 2016-09-02 MED ORDER — PROCHLORPERAZINE MALEATE 10 MG PO TABS
ORAL_TABLET | ORAL | Status: AC
  Filled 2016-09-02: qty 1

## 2016-09-02 MED ORDER — SODIUM CHLORIDE 0.9 % IV SOLN
Freq: Once | INTRAVENOUS | Status: AC
Start: 2016-09-02 — End: 2016-09-02
  Administered 2016-09-02: 13:00:00 via INTRAVENOUS

## 2016-09-02 MED ORDER — SODIUM CHLORIDE 0.9 % IV SOLN
15.8000 mg/kg | Freq: Once | INTRAVENOUS | Status: AC
  Administered 2016-09-02: 1100 mg via INTRAVENOUS
  Filled 2016-09-02: qty 40

## 2016-09-02 MED ORDER — SODIUM CHLORIDE 0.9% FLUSH
10.0000 mL | INTRAVENOUS | Status: DC | PRN
  Administered 2016-09-02: 10 mL
  Filled 2016-09-02: qty 10

## 2016-09-02 MED ORDER — DIPHENHYDRAMINE HCL 25 MG PO CAPS
50.0000 mg | ORAL_CAPSULE | Freq: Once | ORAL | Status: AC
  Administered 2016-09-02: 50 mg via ORAL

## 2016-09-02 MED ORDER — METHYLPREDNISOLONE SODIUM SUCC 125 MG IJ SOLR
INTRAMUSCULAR | Status: AC
  Filled 2016-09-02: qty 2

## 2016-09-02 MED ORDER — ACETAMINOPHEN 325 MG PO TABS
ORAL_TABLET | ORAL | Status: AC
  Filled 2016-09-02: qty 2

## 2016-09-02 MED ORDER — METHYLPREDNISOLONE SODIUM SUCC 125 MG IJ SOLR
125.0000 mg | Freq: Once | INTRAMUSCULAR | Status: AC
  Administered 2016-09-02: 125 mg via INTRAVENOUS

## 2016-09-02 MED ORDER — PROCHLORPERAZINE MALEATE 10 MG PO TABS
10.0000 mg | ORAL_TABLET | Freq: Once | ORAL | Status: AC
  Administered 2016-09-02: 10 mg via ORAL

## 2016-09-02 MED ORDER — HEPARIN SOD (PORK) LOCK FLUSH 100 UNIT/ML IV SOLN
500.0000 [IU] | Freq: Once | INTRAVENOUS | Status: AC | PRN
  Administered 2016-09-02: 500 [IU]
  Filled 2016-09-02: qty 5

## 2016-09-02 MED ORDER — ACETAMINOPHEN 325 MG PO TABS
650.0000 mg | ORAL_TABLET | Freq: Once | ORAL | Status: AC
  Administered 2016-09-02: 650 mg via ORAL

## 2016-09-02 MED ORDER — SODIUM CHLORIDE 0.9 % IJ SOLN
10.0000 mL | INTRAMUSCULAR | Status: DC | PRN
  Administered 2016-09-02: 10 mL via INTRAVENOUS
  Filled 2016-09-02: qty 10

## 2016-09-02 MED ORDER — DIPHENHYDRAMINE HCL 25 MG PO CAPS
ORAL_CAPSULE | ORAL | Status: AC
  Filled 2016-09-02: qty 2

## 2016-09-02 NOTE — Assessment & Plan Note (Signed)
she will continue current medical management. I recommend close follow-up with primary care doctor for medication adjustment.  

## 2016-09-02 NOTE — Assessment & Plan Note (Signed)
One of the recent, acute COPD exacerbation last year nearly caused respiratory failure to the point of death. She had recurrent admission to the hospital last year When she was sick, she quit smoking. Now that she is better, she resumed smoking again. Again, I spent some time explaining to her the importance of nicotine cessation.

## 2016-09-02 NOTE — Assessment & Plan Note (Signed)
She will continue high-dose vitamin D supplements.

## 2016-09-02 NOTE — Telephone Encounter (Signed)
Per LOS I have scheduled appts and notified scheduler 

## 2016-09-02 NOTE — Telephone Encounter (Signed)
Appointments scheduled per 11/1 LOS. Patient given AVS report and calendars with future scheduled appointments.  °

## 2016-09-02 NOTE — Assessment & Plan Note (Signed)
I reviewed the most recent blood work with her. Her SPEP is stable and she is responding well to treatment She has no recurrence of admission to the hospital. We will continue Pomalidomide for 21 days on, 7 days off. However, due to recent neutropenia, I plan to reduce the dose Pomalyst to 2 mg dose. With recent dental extraction, I recommend resuming Daratumumab In the future, I will add Zometa after she has achieved adequate healing from recent dental extraction In the meantime, she will take calcium with vitamin D supplement I will modify her next dose after 09/16/2016 to every 3 weeks. I will see her back on December 6 for further management

## 2016-09-02 NOTE — Progress Notes (Signed)
[facility] OFFICE PROGRESS NOTE  Patient Care Team: Marin Olp, MD as PCP - General (Family Medicine) Jeanann Lewandowsky, MD as Consulting Physician (Medical Oncology) Marin Olp, MD as Consulting Physician (Family Medicine) Heath Lark, MD as Consulting Physician (Hematology and Oncology) [name], MD as Consulting Physician (Orthopedic Surgery)  SUMMARY OF ONCOLOGIC HISTORY: Oncology History   Multiple myeloma, kappa light chain disease, Durie-Salmon stage III       Multiple myeloma in remission ([place])   07/16/2006 Bone Marrow Biopsy    BM biopsy is non-diagnostic      09/21/2006 Procedure    L5 vertebral biopsy 5% plasma cell      10/25/2006 Bone Marrow Biopsy    BM biopsy is hypercellular with 5% plasma cell      11/17/2006 Procedure    L5 biopsy confirmed plasmacytoma      01/03/2007 - 01/10/2007 Radiation Therapy    Approximate date only, received RT for plasmacytoma followed by surgery      09/14/2007 Initial Diagnosis    MULTIPLE  MYELOMA      10/05/2007 Bone Marrow Biopsy    Bm biopsy was negative      06/18/2008 Bone Marrow Biopsy    BM biopsy was negative      07/26/2008 Bone Marrow Transplant    Stem cell transplant at [facility]      04/13/2011 Relapse/Recurrence    Disease relapse      04/14/2011 Bone Marrow Biopsy    Bm biopsy showed 2 % plasma cell      04/27/2011 Relapse/Recurrence    Disease relapse, treated with Velcade/Cytoxan/Dex      09/07/2011 - 07/28/2013 Chemotherapy    She has been receiving Velcade      12/27/2011 Bone Marrow Transplant    2nd transplant at [facility]      07/28/2013 Relapse/Recurrence    Chemo is stopped due to progression of disease      08/29/2013 Imaging    PEt/CT showed recurrence of disease with new lesion on her rib with compression fracture      09/13/2013 Bone Marrow Biopsy    BM biopsy is hypercellular with 6% plasma cell      10/10/2013 - 10/20/2013 Radiation Therapy     Started on palliative XRT for rib pain      11/27/2013 - 04/06/2014 Chemotherapy    The patient starts chemotherapy with Carfilzomib      07/19/2014 Imaging    Repeat bloodwork and PET/CT scan shows significant disease progression.      08/02/2014 - 03/06/2015 Chemotherapy    She enrolled in clinical trial using combination therapy with Revlimid, dexamethasone and Elotuzumab      03/07/2015 - 06/04/2015 Chemotherapy    She is on maintenance Revlimid only without dexamethasone      05/27/2015 Imaging     MRI spine showed new compression fracture.      06/04/2015 - 06/18/2015 Radiation Therapy     she received palliative radiation therapy to 25 Gy C7-T1      07/10/2015 - 08/07/2015 Chemotherapy    She received Elotuzumab, dex and Revlimid. Rx is stopped due to progression      08/01/2015 Imaging    MRI of the spine was performed today due to new onset of worsening right hip pain/flank area. MRI show mild progression of the L1 vertebral body.      08/21/2015 - 08/26/2015 Hospital Admission    She was admitted to the hospital due to pathologic  fracture to right femur      08/22/2015 Surgery    She had IM nail to fractured femur      08/23/2015 - 09/20/2015 Radiation Therapy    She received XRT to her L1 L2 and Right Femur      09/05/2015 - 09/06/2015 Hospital Admission    She had recurrent admission due to displaced fracture, managed conservatively      09/12/2015 - 09/17/2015 Hospital Admission    She had recurrent admission for COPD exacerbation      09/30/2015 -  Chemotherapy    She was started on Pomalyst      12/10/2015 -  Chemotherapy    She started weekly Daratumumab along with Pomalyst       INTERVAL HISTORY: Please see below for problem oriented charting. She returns for further follow-up. She feels well. Denies recent infection. She continues to have chronic bone pain, stable. She continues to smoke, unable to quit. Denies recent cough, chest pain or shortness  of breath  REVIEW OF SYSTEMS:   Constitutional: Denies fevers, chills or abnormal weight loss Eyes: Denies blurriness of vision Ears, nose, mouth, throat, and face: Denies mucositis or sore throat Respiratory: Denies cough, dyspnea or wheezes Cardiovascular: Denies palpitation, chest discomfort or lower extremity swelling Gastrointestinal:  Denies nausea, heartburn or change in bowel habits Skin: Denies abnormal skin rashes Lymphatics: Denies new lymphadenopathy or easy bruising Neurological:Denies numbness, tingling or new weaknesses Behavioral/Psych: Mood is stable, no new changes  All other systems were reviewed with the patient and are negative.  I have reviewed the past medical history, past surgical history, social history and family history with the patient and they are unchanged from previous note.  ALLERGIES:  is allergic to codeine; ibuprofen; and albuterol.  MEDICATIONS:  Current Outpatient Prescriptions  Medication Sig Dispense Refill  . acyclovir (ZOVIRAX) 400 MG tablet TAKE 1 TABLET BY MOUTH EVERY DAY 30 tablet 6  . ALPRAZolam (XANAX) 0.25 MG tablet Take 1 tablet (0.25 mg total) by mouth at bedtime as needed for anxiety. 60 tablet 0  . aspirin 81 MG tablet Take 81 mg by mouth daily.    . chlorhexidine (PERIDEX) 0.12 % solution Rinse with 15 mls twice daily for 30 seconds. Use after breakfast and at bedtime. Spit out excess. Do not swallow. 480 mL prn  . cyclobenzaprine (FLEXERIL) 10 MG tablet Take 1 tablet (10 mg total) by mouth 3 (three) times daily as needed for muscle spasms. 15 tablet 0  . fluticasone-salmeterol (ADVAIR HFA) 115-21 MCG/ACT inhaler Inhale 2 puffs into the lungs 2 (two) times daily. 1 Inhaler 5  . gabapentin (NEURONTIN) 600 MG tablet Take 1 tablet (600 mg total) by mouth 3 (three) times daily. 90 tablet 3  . ipratropium (ATROVENT HFA) 17 MCG/ACT inhaler Inhale 2 puffs into the lungs every 4 (four) hours as needed for wheezing. 1 Inhaler 12  .  levothyroxine (SYNTHROID, LEVOTHROID) 50 MCG tablet TAKE 1 TABLET (50 MCG TOTAL) BY MOUTH DAILY BEFORE BREAKFAST. 90 tablet 1  . lidocaine-prilocaine (EMLA) cream Apply 1 application topically as needed. 30 g 6  . losartan (COZAAR) 50 MG tablet TAKE 1 TABLET (50 MG TOTAL) BY MOUTH DAILY. 90 tablet 1  . morphine (MS CONTIN) 100 MG 12 hr tablet Take 100 mg by mouth every 8 (eight) hours.     Marland Kitchen oxyCODONE (ROXICODONE) 15 MG immediate release tablet Take 15 mg by mouth every 4 (four) hours as needed for pain.    . pomalidomide (POMALYST)  2 MG capsule Take 1 capsule (2 mg total) by mouth daily. Take with water on days 1-21. Repeat every 28 days. 21 capsule 9  . prochlorperazine (COMPAZINE) 10 MG tablet Take 1 tablet (10 mg total) by mouth every 6 (six) hours as needed. for nausea 90 tablet 3  . senna-docusate (SENOKOT-S) 8.6-50 MG tablet Take 1 tablet by mouth daily.    . Vitamin D, Ergocalciferol, (DRISDOL) 50000 units CAPS capsule Take 1 capsule (50,000 Units total) by mouth every 7 (seven) days. 12 capsule 3   No current facility-administered medications for this visit.    Facility-Administered Medications Ordered in Other Visits  Medication Dose Route Frequency Provider Last Rate Last Dose  . sodium chloride 0.9 % injection 10 mL  10 mL Intravenous PRN Heath Lark, MD   10 mL at 02/19/16 0911  . sodium chloride flush (NS) 0.9 % injection 10 mL  10 mL Intracatheter PRN Heath Lark, MD   10 mL at 09/02/16 1746    PHYSICAL EXAMINATION: ECOG PERFORMANCE STATUS: 1 - Symptomatic but completely ambulatory  Vitals:   09/02/16 1049  BP: (!) 158/95  Pulse: 78  Resp: 18  Temp: 98.5 F (36.9 C)   Filed Weights   09/02/16 1049  Weight: 142 lb 12.8 oz (64.8 kg)    GENERAL:alert, no distress and comfortable SKIN: skin color, texture, turgor are normal, no rashes or significant lesions EYES: normal, Conjunctiva are pink and non-injected, sclera clear OROPHARYNX:no exudate, no erythema and lips,  buccal mucosa, and tongue normal  NECK: supple, thyroid normal size, non-tender, without nodularity LYMPH:  no palpable lymphadenopathy in the cervical, axillary or inguinal LUNGS: clear to auscultation and percussion with normal breathing effort HEART: regular rate & rhythm and no murmurs and no lower extremity edema ABDOMEN:abdomen soft, non-tender and normal bowel sounds Musculoskeletal:no cyanosis of digits and no clubbing  NEURO: alert & oriented x 3 with fluent speech, no focal motor/sensory deficits  LABORATORY DATA:  I have reviewed the data as listed    Component Value Date/Time   NA 140 09/02/2016 0957   K 3.9 09/02/2016 0957   CL 107 09/11/2015 2255   CL 107 03/10/2013 1014   CO2 24 09/02/2016 0957   GLUCOSE 95 09/02/2016 0957   GLUCOSE 111 (H) 03/10/2013 1014   BUN 10.1 09/02/2016 0957   CREATININE 0.8 09/02/2016 0957   CALCIUM 9.4 09/02/2016 0957   PROT 6.3 (L) 09/02/2016 0957   ALBUMIN 3.5 09/02/2016 0957   AST 34 09/02/2016 0957   ALT 27 09/02/2016 0957   ALKPHOS 126 09/02/2016 0957   BILITOT 0.52 09/02/2016 0957   GFRNONAA >60 09/12/2015 0620   GFRAA >60 09/12/2015 0620    No results found for: SPEP, UPEP  Lab Results  Component Value Date   WBC 4.0 09/02/2016   NEUTROABS 2.7 09/02/2016   HGB 14.2 09/02/2016   HCT 42.7 09/02/2016   MCV 89.7 09/02/2016   PLT 183 09/02/2016      Chemistry      Component Value Date/Time   NA 140 09/02/2016 0957   K 3.9 09/02/2016 0957   CL 107 09/11/2015 2255   CL 107 03/10/2013 1014   CO2 24 09/02/2016 0957   BUN 10.1 09/02/2016 0957   CREATININE 0.8 09/02/2016 0957      Component Value Date/Time   CALCIUM 9.4 09/02/2016 0957   ALKPHOS 126 09/02/2016 0957   AST 34 09/02/2016 0957   ALT 27 09/02/2016 0957   BILITOT 0.52 09/02/2016 0957  ASSESSMENT & PLAN:  Multiple myeloma in remission ([place]) I reviewed the most recent blood work with her. Her SPEP is stable and she is responding well to  treatment She has no recurrence of admission to the hospital. We will continue Pomalidomide for 21 days on, 7 days off. However, due to recent neutropenia, I plan to reduce the dose Pomalyst to 2 mg dose. With recent dental extraction, I recommend resuming Daratumumab In the future, I will add Zometa after she has achieved adequate healing from recent dental extraction In the meantime, she will take calcium with vitamin D supplement I will modify her next dose after 09/16/2016 to every 3 weeks. I will see her back on December 6 for further management  Vitamin D deficiency She will continue high-dose vitamin D supplements.   Cigarette smoker One of the recent, acute COPD exacerbation last year nearly caused respiratory failure to the point of death. She had recurrent admission to the hospital last year When she was sick, she quit smoking. Now that she is better, she resumed smoking again. Again, I spent some time explaining to her the importance of nicotine cessation.  Essential hypertension she will continue current medical management. I recommend close follow-up with primary care doctor for medication adjustment.    Orders Placed This Encounter  Procedures  . Kappa/lambda light chains    Standing Status:   Future    Standing Expiration Date:   10/07/2017  . Multiple Myeloma Panel (SPEP&IFE w/QIG)    Standing Status:   Future    Standing Expiration Date:   10/07/2017   All questions were answered. The patient knows to call the clinic with any problems, questions or concerns. No barriers to learning was detected. I spent 25 minutes counseling the patient face to face. The total time spent in the appointment was 30 minutes and more than 50% was on counseling and review of test results     Heath Lark, MD 09/02/2016 6:18 PM

## 2016-09-08 DIAGNOSIS — K5900 Constipation, unspecified: Secondary | ICD-10-CM | POA: Diagnosis not present

## 2016-09-08 DIAGNOSIS — M47812 Spondylosis without myelopathy or radiculopathy, cervical region: Secondary | ICD-10-CM | POA: Diagnosis not present

## 2016-09-08 DIAGNOSIS — Z79891 Long term (current) use of opiate analgesic: Secondary | ICD-10-CM | POA: Diagnosis not present

## 2016-09-08 DIAGNOSIS — G894 Chronic pain syndrome: Secondary | ICD-10-CM | POA: Diagnosis not present

## 2016-09-10 ENCOUNTER — Telehealth: Payer: Self-pay | Admitting: *Deleted

## 2016-09-10 NOTE — Telephone Encounter (Signed)
Kendra Johns, can you check on the status?

## 2016-09-10 NOTE — Telephone Encounter (Signed)
Called [employer] and s/w Nicki to f/u status of Pomalyst.  She says they did receive refill from our office.  The issue is pt was receiving assistance through PAF and they have run out of funds.  They faxed [place] an application for "Free Drug" through [employer]   Informed her we have not received form.  Please fax again to nurse's desk fax [phone].  She will re-fax application to nurse's fax.

## 2016-09-10 NOTE — Telephone Encounter (Signed)
Patient called inquiring about Pomalyst prescription. I informed of that it was sent to [facility] on 10/27 (e-scribed), but she has not heard anything from [employer].

## 2016-09-13 ENCOUNTER — Other Ambulatory Visit: Payer: Self-pay | Admitting: Hematology and Oncology

## 2016-09-16 ENCOUNTER — Ambulatory Visit (HOSPITAL_BASED_OUTPATIENT_CLINIC_OR_DEPARTMENT_OTHER): Payer: Medicare Other

## 2016-09-16 ENCOUNTER — Ambulatory Visit: Payer: Medicare Other

## 2016-09-16 ENCOUNTER — Telehealth: Payer: Self-pay | Admitting: *Deleted

## 2016-09-16 ENCOUNTER — Other Ambulatory Visit: Payer: Self-pay | Admitting: Hematology and Oncology

## 2016-09-16 ENCOUNTER — Other Ambulatory Visit (HOSPITAL_BASED_OUTPATIENT_CLINIC_OR_DEPARTMENT_OTHER): Payer: Medicare Other

## 2016-09-16 VITALS — BP 174/91 | HR 75 | Temp 97.7°F | Resp 20

## 2016-09-16 DIAGNOSIS — C9001 Multiple myeloma in remission: Secondary | ICD-10-CM

## 2016-09-16 DIAGNOSIS — C9002 Multiple myeloma in relapse: Secondary | ICD-10-CM

## 2016-09-16 DIAGNOSIS — Z5112 Encounter for antineoplastic immunotherapy: Secondary | ICD-10-CM | POA: Diagnosis present

## 2016-09-16 LAB — COMPREHENSIVE METABOLIC PANEL
ALBUMIN: 3.5 g/dL (ref 3.5–5.0)
ALK PHOS: 118 U/L (ref 40–150)
ALT: 27 U/L (ref 0–55)
AST: 33 U/L (ref 5–34)
Anion Gap: 10 mEq/L (ref 3–11)
BILIRUBIN TOTAL: 0.54 mg/dL (ref 0.20–1.20)
BUN: 7.7 mg/dL (ref 7.0–26.0)
CO2: 22 meq/L (ref 22–29)
CREATININE: 0.8 mg/dL (ref 0.6–1.1)
Calcium: 9.3 mg/dL (ref 8.4–10.4)
Chloride: 107 mEq/L (ref 98–109)
EGFR: >90 mL/min/{1.73_m2} (ref >90)
GLUCOSE: 123 mg/dL (ref 70–140)
Potassium: 3.8 mEq/L (ref 3.5–5.1)
SODIUM: 139 meq/L (ref 136–145)
TOTAL PROTEIN: 6.2 g/dL — AB (ref 6.4–8.3)

## 2016-09-16 LAB — CBC WITH DIFFERENTIAL/PLATELET
BASO%: 0.9 % (ref 0.0–2.0)
Basophils Absolute: 0 10*3/uL (ref 0.0–0.1)
EOS ABS: 0.3 10*3/uL (ref 0.0–0.5)
EOS%: 7.5 % — ABNORMAL HIGH (ref 0.0–7.0)
HCT: 44.2 % (ref 34.8–46.6)
HEMOGLOBIN: 14.4 g/dL (ref 11.6–15.9)
LYMPH%: 16.1 % (ref 14.0–49.7)
MCH: 29.6 pg (ref 25.1–34.0)
MCHC: 32.6 g/dL (ref 31.5–36.0)
MCV: 90.8 fL (ref 79.5–101.0)
MONO#: 0.5 10*3/uL (ref 0.1–0.9)
MONO%: 13.6 % (ref 0.0–14.0)
NEUT%: 61.9 % (ref 38.4–76.8)
NEUTROS ABS: 2.4 10*3/uL (ref 1.5–6.5)
Platelets: 123 10*3/uL — ABNORMAL LOW (ref 145–400)
RBC: 4.86 10*6/uL (ref 3.70–5.45)
RDW: 17.3 % — AB (ref 11.2–14.5)
WBC: 3.9 10*3/uL (ref 3.9–10.3)
lymph#: 0.6 10*3/uL — ABNORMAL LOW (ref 0.9–3.3)

## 2016-09-16 MED ORDER — METHYLPREDNISOLONE SODIUM SUCC 125 MG IJ SOLR
125.0000 mg | Freq: Once | INTRAMUSCULAR | Status: AC
Start: 2016-09-16 — End: 2016-09-16
  Administered 2016-09-16: 125 mg via INTRAVENOUS

## 2016-09-16 MED ORDER — METHYLPREDNISOLONE SODIUM SUCC 125 MG IJ SOLR
INTRAMUSCULAR | Status: AC
  Filled 2016-09-16: qty 2

## 2016-09-16 MED ORDER — SODIUM CHLORIDE 0.9 % IV SOLN
Freq: Once | INTRAVENOUS | Status: AC
  Administered 2016-09-16: 12:00:00 via INTRAVENOUS

## 2016-09-16 MED ORDER — PROCHLORPERAZINE MALEATE 10 MG PO TABS
10.0000 mg | ORAL_TABLET | Freq: Once | ORAL | Status: AC
  Administered 2016-09-16: 10 mg via ORAL

## 2016-09-16 MED ORDER — ACETAMINOPHEN 325 MG PO TABS
650.0000 mg | ORAL_TABLET | Freq: Once | ORAL | Status: AC
  Administered 2016-09-16: 650 mg via ORAL

## 2016-09-16 MED ORDER — DIPHENHYDRAMINE HCL 25 MG PO CAPS
ORAL_CAPSULE | ORAL | Status: AC
  Filled 2016-09-16: qty 2

## 2016-09-16 MED ORDER — SODIUM CHLORIDE 0.9 % IJ SOLN
10.0000 mL | INTRAMUSCULAR | Status: DC | PRN
  Administered 2016-09-16: 10 mL via INTRAVENOUS
  Filled 2016-09-16: qty 10

## 2016-09-16 MED ORDER — ACETAMINOPHEN 325 MG PO TABS
ORAL_TABLET | ORAL | Status: AC
  Filled 2016-09-16: qty 2

## 2016-09-16 MED ORDER — DIPHENHYDRAMINE HCL 25 MG PO CAPS
50.0000 mg | ORAL_CAPSULE | Freq: Once | ORAL | Status: AC
  Administered 2016-09-16: 50 mg via ORAL

## 2016-09-16 MED ORDER — SODIUM CHLORIDE 0.9% FLUSH
10.0000 mL | INTRAVENOUS | Status: DC | PRN
  Administered 2016-09-16: 10 mL
  Filled 2016-09-16: qty 10

## 2016-09-16 MED ORDER — HEPARIN SOD (PORK) LOCK FLUSH 100 UNIT/ML IV SOLN
500.0000 [IU] | Freq: Once | INTRAVENOUS | Status: AC | PRN
  Administered 2016-09-16: 500 [IU]
  Filled 2016-09-16: qty 5

## 2016-09-16 MED ORDER — SODIUM CHLORIDE 0.9 % IV SOLN
1100.0000 mg | Freq: Once | INTRAVENOUS | Status: AC
  Administered 2016-09-16: 1100 mg via INTRAVENOUS
  Filled 2016-09-16: qty 40

## 2016-09-16 MED ORDER — PROCHLORPERAZINE MALEATE 10 MG PO TABS
ORAL_TABLET | ORAL | Status: AC
  Filled 2016-09-16: qty 1

## 2016-09-16 NOTE — Patient Instructions (Signed)
[facility] Discharge Instructions for Patients Receiving Chemotherapy  Today you received the following chemotherapy agents: darzalex  To help prevent nausea and vomiting after your treatment, we encourage you to take your nausea medication as prescribed. If you develop nausea and vomiting that is not controlled by your nausea medication, call the clinic.   BELOW ARE SYMPTOMS THAT SHOULD BE REPORTED IMMEDIATELY:  *FEVER GREATER THAN 100.5 F  *CHILLS WITH OR WITHOUT FEVER  NAUSEA AND VOMITING THAT IS NOT CONTROLLED WITH YOUR NAUSEA MEDICATION  *UNUSUAL SHORTNESS OF BREATH  *UNUSUAL BRUISING OR BLEEDING  TENDERNESS IN MOUTH AND THROAT WITH OR WITHOUT PRESENCE OF ULCERS  *URINARY PROBLEMS  *BOWEL PROBLEMS  UNUSUAL RASH Items with * indicate a potential emergency and should be followed up as soon as possible.  Feel free to call the clinic you have any questions or concerns. The clinic phone number is [phone].  Please show the [facility] at check-in to the Emergency Department and triage nurse.

## 2016-09-16 NOTE — Telephone Encounter (Signed)
Infusion RN says pt asking about Pomalyst. She still has not received refill.   S/w [employer] last week and they were going to fax a new form for pt assistance(see note).  I have not received the form, but possibly someone else got it?   LVM for Johny Drilling,  Oral Chemo Navigator, to f/u w/ [employer] and Pt regarding medication and co-pay assistance.

## 2016-09-17 LAB — KAPPA/LAMBDA LIGHT CHAINS
IG KAPPA FREE LIGHT CHAIN: 135.1 mg/L — AB (ref 3.3–19.4)
IG LAMBDA FREE LIGHT CHAIN: 2.6 mg/L — AB (ref 5.7–26.3)
KAPPA/LAMBDA FLC RATIO: 51.96 — AB (ref 0.26–1.65)

## 2016-09-18 LAB — MULTIPLE MYELOMA PANEL, SERUM
Albumin SerPl Elph-Mcnc: 3.7 g/dL (ref 2.9–4.4)
Albumin/Glob SerPl: 1.7 (ref 0.7–1.7)
Alpha 1: 0.3 g/dL (ref 0.0–0.4)
Alpha2 Glob SerPl Elph-Mcnc: 0.7 g/dL (ref 0.4–1.0)
B-GLOBULIN SERPL ELPH-MCNC: 1 g/dL (ref 0.7–1.3)
GAMMA GLOB SERPL ELPH-MCNC: 0.4 g/dL (ref 0.4–1.8)
GLOBULIN, TOTAL: 2.3 g/dL (ref 2.2–3.9)
IGA/IMMUNOGLOBULIN A, SERUM: 18 mg/dL — AB (ref 87–352)
IgG, Qn, Serum: 399 mg/dL — ABNORMAL LOW (ref 700–1600)
IgM, Qn, Serum: 13 mg/dL — ABNORMAL LOW (ref 26–217)
M PROTEIN SERPL ELPH-MCNC: 0.1 g/dL — AB
Total Protein: 6 g/dL (ref 6.0–8.5)

## 2016-09-21 ENCOUNTER — Telehealth: Payer: Self-pay | Admitting: Pharmacist

## 2016-09-21 ENCOUNTER — Other Ambulatory Visit: Payer: Self-pay | Admitting: Hematology and Oncology

## 2016-09-21 DIAGNOSIS — C9001 Multiple myeloma in remission: Secondary | ICD-10-CM

## 2016-09-21 NOTE — Telephone Encounter (Signed)
Oral Chemotherapy Pharmacist Encounter  Completed patient assistance application for [employer] for patient's Pomalyst. Application faxed to [employer] at [phone] on 09/17/16.  Received notification from [employer] that they had received patient's application and were also looking into being able to re-apply for funds through [employer] as they opened for a brief time on 11/17.  [employer] P[employer] would be in contact with [employer] about copay assistance foundation funds and also continue to process patient's application through their program  They will keep [place] posted about status.  Oral Chemo Clinic will continue to follow.  Johny Drilling, PharmD, BCPS 09/21/2016  10:05 AM Oral Chemotherapy Clinic [phone]

## 2016-09-22 ENCOUNTER — Ambulatory Visit (HOSPITAL_BASED_OUTPATIENT_CLINIC_OR_DEPARTMENT_OTHER): Payer: Medicare Other | Admitting: Hematology and Oncology

## 2016-09-22 ENCOUNTER — Other Ambulatory Visit: Payer: Self-pay | Admitting: Hematology and Oncology

## 2016-09-22 ENCOUNTER — Telehealth: Payer: Self-pay | Admitting: *Deleted

## 2016-09-22 VITALS — BP 159/104 | HR 89 | Temp 98.2°F | Resp 18 | Ht 64.0 in | Wt 139.6 lb

## 2016-09-22 DIAGNOSIS — C9001 Multiple myeloma in remission: Secondary | ICD-10-CM

## 2016-09-22 DIAGNOSIS — D696 Thrombocytopenia, unspecified: Secondary | ICD-10-CM

## 2016-09-22 DIAGNOSIS — G893 Neoplasm related pain (acute) (chronic): Secondary | ICD-10-CM | POA: Diagnosis not present

## 2016-09-22 MED ORDER — DEXAMETHASONE 4 MG PO TABS: 4.0000 mg | ORAL_TABLET | Freq: Every day | ORAL | 1 refills | Status: AC

## 2016-09-22 MED ORDER — POMALIDOMIDE 2 MG PO CAPS: 2.0000 mg | ORAL_CAPSULE | Freq: Every day | ORAL | 9 refills | Status: DC

## 2016-09-22 NOTE — Assessment & Plan Note (Signed)
The patient had likely have cancer associated pain. In addition to the dexamethasone, she will continue on her chronic pain medicine as prescribed.

## 2016-09-22 NOTE — Telephone Encounter (Signed)
Faxed Pomalyst to [facility]. Fax # [phone]

## 2016-09-22 NOTE — Telephone Encounter (Signed)
Oral Chemotherapy Pharmacist Encounter  Received notification from [place] PAP program that patient has been successfully enrolled in their program to receive free Pomalyst from the manufacturer. We will need to fax a prescription to "[facility]" at fax # [phone] Pharmacy phone # [phone].  MD has been notified.  Oral Chemo Clinic will sign off at this time. Please reach out in the future if we can be of further assistance.  Johny Drilling, PharmD, BCPS 09/22/2016  1:00 PM Oral Chemotherapy Clinic [phone]

## 2016-09-22 NOTE — Progress Notes (Signed)
[facility] OFFICE PROGRESS NOTE  Patient Care Team: Marin Olp, MD as PCP - General (Family Medicine) Jeanann Lewandowsky, MD as Consulting Physician (Medical Oncology) Marin Olp, MD as Consulting Physician (Family Medicine) Heath Lark, MD as Consulting Physician (Hematology and Oncology) [name], MD as Consulting Physician (Orthopedic Surgery)  SUMMARY OF ONCOLOGIC HISTORY: Oncology History   Multiple myeloma, kappa light chain disease, Durie-Salmon stage III       Multiple myeloma in remission ([place])   07/16/2006 Bone Marrow Biopsy    BM biopsy is non-diagnostic      09/21/2006 Procedure    L5 vertebral biopsy 5% plasma cell      10/25/2006 Bone Marrow Biopsy    BM biopsy is hypercellular with 5% plasma cell      11/17/2006 Procedure    L5 biopsy confirmed plasmacytoma      01/03/2007 - 01/10/2007 Radiation Therapy    Approximate date only, received RT for plasmacytoma followed by surgery      09/14/2007 Initial Diagnosis    MULTIPLE  MYELOMA      10/05/2007 Bone Marrow Biopsy    Bm biopsy was negative      06/18/2008 Bone Marrow Biopsy    BM biopsy was negative      07/26/2008 Bone Marrow Transplant    Stem cell transplant at [facility]      04/13/2011 Relapse/Recurrence    Disease relapse      04/14/2011 Bone Marrow Biopsy    Bm biopsy showed 2 % plasma cell      04/27/2011 Relapse/Recurrence    Disease relapse, treated with Velcade/Cytoxan/Dex      09/07/2011 - 07/28/2013 Chemotherapy    She has been receiving Velcade      12/27/2011 Bone Marrow Transplant    2nd transplant at [facility]      07/28/2013 Relapse/Recurrence    Chemo is stopped due to progression of disease      08/29/2013 Imaging    PEt/CT showed recurrence of disease with new lesion on her rib with compression fracture      09/13/2013 Bone Marrow Biopsy    BM biopsy is hypercellular with 6% plasma cell      10/10/2013 - 10/20/2013 Radiation Therapy     Started on palliative XRT for rib pain      11/27/2013 - 04/06/2014 Chemotherapy    The patient starts chemotherapy with Carfilzomib      07/19/2014 Imaging    Repeat bloodwork and PET/CT scan shows significant disease progression.      08/02/2014 - 03/06/2015 Chemotherapy    She enrolled in clinical trial using combination therapy with Revlimid, dexamethasone and Elotuzumab      03/07/2015 - 06/04/2015 Chemotherapy    She is on maintenance Revlimid only without dexamethasone      05/27/2015 Imaging     MRI spine showed new compression fracture.      06/04/2015 - 06/18/2015 Radiation Therapy     she received palliative radiation therapy to 25 Gy C7-T1      07/10/2015 - 08/07/2015 Chemotherapy    She received Elotuzumab, dex and Revlimid. Rx is stopped due to progression      08/01/2015 Imaging    MRI of the spine was performed today due to new onset of worsening right hip pain/flank area. MRI show mild progression of the L1 vertebral body.      08/21/2015 - 08/26/2015 Hospital Admission    She was admitted to the hospital due to pathologic  fracture to right femur      08/22/2015 Surgery    She had IM nail to fractured femur      08/23/2015 - 09/20/2015 Radiation Therapy    She received XRT to her L1 L2 and Right Femur      09/05/2015 - 09/06/2015 Hospital Admission    She had recurrent admission due to displaced fracture, managed conservatively      09/12/2015 - 09/17/2015 Hospital Admission    She had recurrent admission for COPD exacerbation      09/30/2015 -  Chemotherapy    She was started on Pomalyst      12/10/2015 -  Chemotherapy    She started weekly Daratumumab along with Pomalyst       INTERVAL HISTORY: Please see below for problem oriented charting. She is seen urgently today because of severe right chest wall/rib pain. The pain begun last week after her chemotherapy and she thought she might have accidentally injured her chest wall. Her pain medicine only  took the edge off but she has constant persistent pain. There is no radiation to other sites. She described her pain as severe. She had missed taking her oral chemotherapy for 2 months due to insurance issue. She denies pain elsewhere. She denies recent infection  REVIEW OF SYSTEMS:   Constitutional: Denies fevers, chills or abnormal weight loss Eyes: Denies blurriness of vision Ears, nose, mouth, throat, and face: Denies mucositis or sore throat Respiratory: Denies cough, dyspnea or wheezes Cardiovascular: Denies palpitation, chest discomfort or lower extremity swelling Gastrointestinal:  Denies nausea, heartburn or change in bowel habits Skin: Denies abnormal skin rashes Lymphatics: Denies new lymphadenopathy or easy bruising Neurological:Denies numbness, tingling or new weaknesses Behavioral/Psych: Mood is stable, no new changes  All other systems were reviewed with the patient and are negative.  I have reviewed the past medical history, past surgical history, social history and family history with the patient and they are unchanged from previous note.  ALLERGIES:  is allergic to codeine; ibuprofen; and albuterol.  MEDICATIONS:  Current Outpatient Prescriptions  Medication Sig Dispense Refill  . acyclovir (ZOVIRAX) 400 MG tablet TAKE 1 TABLET BY MOUTH EVERY DAY 30 tablet 6  . ALPRAZolam (XANAX) 0.25 MG tablet Take 1 tablet (0.25 mg total) by mouth at bedtime as needed for anxiety. 60 tablet 0  . aspirin 81 MG tablet Take 81 mg by mouth daily.    Marland Kitchen levothyroxine (SYNTHROID, LEVOTHROID) 50 MCG tablet TAKE 1 TABLET BY MOUTH EVERY DAY BEFORE BREAKFAST 90 tablet 1  . lidocaine-prilocaine (EMLA) cream Apply 1 application topically as needed. 30 g 6  . losartan (COZAAR) 50 MG tablet TAKE 1 TABLET (50 MG TOTAL) BY MOUTH DAILY. 90 tablet 1  . morphine (MS CONTIN) 100 MG 12 hr tablet Take 100 mg by mouth every 8 (eight) hours.     Marland Kitchen oxyCODONE (ROXICODONE) 15 MG immediate release tablet  Take 15 mg by mouth every 4 (four) hours as needed for pain.    Marland Kitchen prochlorperazine (COMPAZINE) 10 MG tablet Take 1 tablet (10 mg total) by mouth every 6 (six) hours as needed. for nausea 90 tablet 3  . Vitamin D, Ergocalciferol, (DRISDOL) 50000 units CAPS capsule Take 1 capsule (50,000 Units total) by mouth every 7 (seven) days. 12 capsule 3  . chlorhexidine (PERIDEX) 0.12 % solution Rinse with 15 mls twice daily for 30 seconds. Use after breakfast and at bedtime. Spit out excess. Do not swallow. (Patient not taking: Reported on 09/22/2016) 480 mL  prn  . cyclobenzaprine (FLEXERIL) 10 MG tablet Take 1 tablet (10 mg total) by mouth 3 (three) times daily as needed for muscle spasms. (Patient not taking: Reported on 09/22/2016) 15 tablet 0  . dexamethasone (DECADRON) 4 MG tablet Take 1 tablet (4 mg total) by mouth daily. 60 tablet 1  . fluticasone-salmeterol (ADVAIR HFA) 115-21 MCG/ACT inhaler Inhale 2 puffs into the lungs 2 (two) times daily. (Patient not taking: Reported on 09/22/2016) 1 Inhaler 5  . gabapentin (NEURONTIN) 600 MG tablet Take 1 tablet (600 mg total) by mouth 3 (three) times daily. (Patient not taking: Reported on 09/22/2016) 90 tablet 3  . ipratropium (ATROVENT HFA) 17 MCG/ACT inhaler Inhale 2 puffs into the lungs every 4 (four) hours as needed for wheezing. (Patient not taking: Reported on 09/22/2016) 1 Inhaler 12  . pomalidomide (POMALYST) 2 MG capsule Take 1 capsule (2 mg total) by mouth daily. Take with water on days 1-21. Repeat every 28 days. 21 capsule 9   No current facility-administered medications for this visit.    Facility-Administered Medications Ordered in Other Visits  Medication Dose Route Frequency Provider Last Rate Last Dose  . sodium chloride 0.9 % injection 10 mL  10 mL Intravenous PRN Heath Lark, MD   10 mL at 02/19/16 0911    PHYSICAL EXAMINATION: ECOG PERFORMANCE STATUS: 1 - Symptomatic but completely ambulatory  Vitals:   09/22/16 1116  BP: (!) 159/104   Pulse: 89  Resp: 18  Temp: 98.2 F (36.8 C)   Filed Weights   09/22/16 1116  Weight: 139 lb 9.6 oz (63.3 kg)    GENERAL:alert, no distress and comfortable SKIN: skin color, texture, turgor are normal, no rashes or significant lesions EYES: normal, Conjunctiva are pink and non-injected, sclera clear Musculoskeletal:no cyanosis of digits and no clubbing  NEURO: alert & oriented x 3 with fluent speech, no focal motor/sensory deficits  LABORATORY DATA:  I have reviewed the data as listed    Component Value Date/Time   NA 139 09/16/2016 1021   K 3.8 09/16/2016 1021   CL 107 09/11/2015 2255   CL 107 03/10/2013 1014   CO2 22 09/16/2016 1021   GLUCOSE 123 09/16/2016 1021   GLUCOSE 111 (H) 03/10/2013 1014   BUN 7.7 09/16/2016 1021   CREATININE 0.8 09/16/2016 1021   CALCIUM 9.3 09/16/2016 1021   PROT 6.2 (L) 09/16/2016 1021   ALBUMIN 3.5 09/16/2016 1021   AST 33 09/16/2016 1021   ALT 27 09/16/2016 1021   ALKPHOS 118 09/16/2016 1021   BILITOT 0.54 09/16/2016 1021   GFRNONAA >60 09/12/2015 0620   GFRAA >60 09/12/2015 0620    No results found for: SPEP, UPEP  Lab Results  Component Value Date   WBC 3.9 09/16/2016   NEUTROABS 2.4 09/16/2016   HGB 14.4 09/16/2016   HCT 44.2 09/16/2016   MCV 90.8 09/16/2016   PLT 123 (L) 09/16/2016      Chemistry      Component Value Date/Time   NA 139 09/16/2016 1021   K 3.8 09/16/2016 1021   CL 107 09/11/2015 2255   CL 107 03/10/2013 1014   CO2 22 09/16/2016 1021   BUN 7.7 09/16/2016 1021   CREATININE 0.8 09/16/2016 1021      Component Value Date/Time   CALCIUM 9.3 09/16/2016 1021   ALKPHOS 118 09/16/2016 1021   AST 33 09/16/2016 1021   ALT 27 09/16/2016 1021   BILITOT 0.54 09/16/2016 1021      ASSESSMENT & PLAN:  Multiple myeloma in remission [facility]) I review her myeloma panel from last week. She has been off Pomalidomide for 2 months due to insurance issue. I suspect a new rib pain is due to disease progression. I do  not think it is beneficial to do any x-rays imaging study I will like her to start high-dose dexamethasone daily until I see her back. I will work with my nursing staff to try to get her funding to resume her chemotherapy  Cancer associated pain The patient had likely have cancer associated pain. In addition to the dexamethasone, she will continue on her chronic pain medicine as prescribed.   No orders of the defined types were placed in this encounter.  All questions were answered. The patient knows to call the clinic with any problems, questions or concerns. No barriers to learning was detected. I spent 15 minutes counseling the patient face to face. The total time spent in the appointment was 20 minutes and more than 50% was on counseling and review of test results     Heath Lark, MD 09/22/2016 1:56 PM

## 2016-09-22 NOTE — Assessment & Plan Note (Signed)
I review her myeloma panel from last week. She has been off Pomalidomide for 2 months due to insurance issue. I suspect a new rib pain is due to disease progression. I do not think it is beneficial to do any x-rays imaging study I will like her to start high-dose dexamethasone daily until I see her back. I will work with my nursing staff to try to get her funding to resume her chemotherapy

## 2016-09-23 ENCOUNTER — Other Ambulatory Visit: Payer: Self-pay

## 2016-09-28 ENCOUNTER — Telehealth: Payer: Self-pay | Admitting: *Deleted

## 2016-09-28 NOTE — Telephone Encounter (Signed)
Called pt to check on status of Pomalyst.  She received medication and started taking it again on 11/22.

## 2016-10-01 ENCOUNTER — Telehealth: Payer: Self-pay | Admitting: Internal Medicine

## 2016-10-01 DIAGNOSIS — G894 Chronic pain syndrome: Secondary | ICD-10-CM | POA: Diagnosis not present

## 2016-10-01 DIAGNOSIS — K5900 Constipation, unspecified: Secondary | ICD-10-CM | POA: Diagnosis not present

## 2016-10-01 DIAGNOSIS — M47812 Spondylosis without myelopathy or radiculopathy, cervical region: Secondary | ICD-10-CM | POA: Diagnosis not present

## 2016-10-01 DIAGNOSIS — Z79891 Long term (current) use of opiate analgesic: Secondary | ICD-10-CM | POA: Diagnosis not present

## 2016-10-01 MED ORDER — AZITHROMYCIN 250 MG PO TABS: 250.0000 mg | ORAL_TABLET | ORAL | 0 refills | Status: DC

## 2016-10-01 NOTE — Telephone Encounter (Signed)
Spoke with pt and notified of recs per CDY She verbalized understanding and rx was sent to pharm  

## 2016-10-01 NOTE — Telephone Encounter (Signed)
Offer Zpak  Ok to take otc Mucinex-DM if needed

## 2016-10-01 NOTE — Telephone Encounter (Signed)
Spoke with pt. She feels she is getting a chest cold. Reports chest congestion, cough and SOB. Cough is producing yellow mucus. Denies chest tightness, wheezing or fever. Has been taking Nyquil with no relief. Symptoms started 1 week ago. Would like an antibiotic sent in. CY - please advise as MW is off this afternoon.  Allergies  Allergen Reactions  . Codeine Itching  . Ibuprofen Other (See Comments)    REACTION: gi trouble  . Albuterol Hives    Pt tolerated albuterol neb without issue 12/2014   Current Outpatient Prescriptions on File Prior to Visit  Medication Sig Dispense Refill  . acyclovir (ZOVIRAX) 400 MG tablet TAKE 1 TABLET BY MOUTH EVERY DAY 30 tablet 6  . ALPRAZolam (XANAX) 0.25 MG tablet Take 1 tablet (0.25 mg total) by mouth at bedtime as needed for anxiety. 60 tablet 0  . aspirin 81 MG tablet Take 81 mg by mouth daily.    . chlorhexidine (PERIDEX) 0.12 % solution Rinse with 15 mls twice daily for 30 seconds. Use after breakfast and at bedtime. Spit out excess. Do not swallow. (Patient not taking: Reported on 09/22/2016) 480 mL prn  . cyclobenzaprine (FLEXERIL) 10 MG tablet Take 1 tablet (10 mg total) by mouth 3 (three) times daily as needed for muscle spasms. (Patient not taking: Reported on 09/22/2016) 15 tablet 0  . dexamethasone (DECADRON) 4 MG tablet Take 1 tablet (4 mg total) by mouth daily. 60 tablet 1  . fluticasone-salmeterol (ADVAIR HFA) 115-21 MCG/ACT inhaler Inhale 2 puffs into the lungs 2 (two) times daily. (Patient not taking: Reported on 09/22/2016) 1 Inhaler 5  . gabapentin (NEURONTIN) 600 MG tablet Take 1 tablet (600 mg total) by mouth 3 (three) times daily. (Patient not taking: Reported on 09/22/2016) 90 tablet 3  . ipratropium (ATROVENT HFA) 17 MCG/ACT inhaler Inhale 2 puffs into the lungs every 4 (four) hours as needed for wheezing. (Patient not taking: Reported on 09/22/2016) 1 Inhaler 12  . levothyroxine (SYNTHROID, LEVOTHROID) 50 MCG tablet TAKE 1  TABLET BY MOUTH EVERY DAY BEFORE BREAKFAST 90 tablet 1  . lidocaine-prilocaine (EMLA) cream Apply 1 application topically as needed. 30 g 6  . losartan (COZAAR) 50 MG tablet TAKE 1 TABLET (50 MG TOTAL) BY MOUTH DAILY. 90 tablet 1  . morphine (MS CONTIN) 100 MG 12 hr tablet Take 100 mg by mouth every 8 (eight) hours.     Marland Kitchen oxyCODONE (ROXICODONE) 15 MG immediate release tablet Take 15 mg by mouth every 4 (four) hours as needed for pain.    . pomalidomide (POMALYST) 2 MG capsule Take 1 capsule (2 mg total) by mouth daily. Take with water on days 1-21. Repeat every 28 days. 21 capsule 9  . prochlorperazine (COMPAZINE) 10 MG tablet Take 1 tablet (10 mg total) by mouth every 6 (six) hours as needed. for nausea 90 tablet 3  . Vitamin D, Ergocalciferol, (DRISDOL) 50000 units CAPS capsule Take 1 capsule (50,000 Units total) by mouth every 7 (seven) days. 12 capsule 3  . [DISCONTINUED] amLODipine (NORVASC) 5 MG tablet Take 1 tablet (5 mg total) by mouth daily. 90 tablet 3   Current Facility-Administered Medications on File Prior to Visit  Medication Dose Route Frequency Provider Last Rate Last Dose  . sodium chloride 0.9 % injection 10 mL  10 mL Intravenous PRN Heath Lark, MD   10 mL at 02/19/16 [phone]

## 2016-10-05 ENCOUNTER — Ambulatory Visit: Payer: Self-pay | Admitting: Hematology and Oncology

## 2016-10-07 ENCOUNTER — Telehealth: Payer: Self-pay | Admitting: Hematology and Oncology

## 2016-10-07 ENCOUNTER — Ambulatory Visit: Payer: Medicare Other

## 2016-10-07 ENCOUNTER — Other Ambulatory Visit (HOSPITAL_BASED_OUTPATIENT_CLINIC_OR_DEPARTMENT_OTHER): Payer: Medicare Other

## 2016-10-07 ENCOUNTER — Ambulatory Visit (HOSPITAL_BASED_OUTPATIENT_CLINIC_OR_DEPARTMENT_OTHER): Payer: Medicare Other | Admitting: Hematology and Oncology

## 2016-10-07 ENCOUNTER — Ambulatory Visit (HOSPITAL_BASED_OUTPATIENT_CLINIC_OR_DEPARTMENT_OTHER): Payer: Medicare Other

## 2016-10-07 ENCOUNTER — Encounter: Payer: Self-pay | Admitting: Hematology and Oncology

## 2016-10-07 VITALS — BP 154/86 | HR 72 | Temp 98.1°F | Resp 17 | Ht 64.0 in | Wt 144.6 lb

## 2016-10-07 VITALS — BP 142/86 | HR 70 | Temp 98.6°F | Resp 16

## 2016-10-07 DIAGNOSIS — C9001 Multiple myeloma in remission: Secondary | ICD-10-CM

## 2016-10-07 DIAGNOSIS — J449 Chronic obstructive pulmonary disease, unspecified: Secondary | ICD-10-CM | POA: Diagnosis not present

## 2016-10-07 DIAGNOSIS — R0781 Pleurodynia: Secondary | ICD-10-CM

## 2016-10-07 DIAGNOSIS — C9002 Multiple myeloma in relapse: Secondary | ICD-10-CM

## 2016-10-07 DIAGNOSIS — Z5112 Encounter for antineoplastic immunotherapy: Secondary | ICD-10-CM | POA: Diagnosis present

## 2016-10-07 DIAGNOSIS — D696 Thrombocytopenia, unspecified: Secondary | ICD-10-CM | POA: Diagnosis not present

## 2016-10-07 DIAGNOSIS — Z720 Tobacco use: Secondary | ICD-10-CM | POA: Diagnosis not present

## 2016-10-07 DIAGNOSIS — G893 Neoplasm related pain (acute) (chronic): Secondary | ICD-10-CM

## 2016-10-07 DIAGNOSIS — F17210 Nicotine dependence, cigarettes, uncomplicated: Secondary | ICD-10-CM

## 2016-10-07 LAB — COMPREHENSIVE METABOLIC PANEL
ALBUMIN: 3.1 g/dL — AB (ref 3.5–5.0)
ALK PHOS: 89 U/L (ref 40–150)
ALT: 99 U/L — ABNORMAL HIGH (ref 0–55)
AST: 72 U/L — ABNORMAL HIGH (ref 5–34)
Anion Gap: 9 mEq/L (ref 3–11)
BILIRUBIN TOTAL: 0.49 mg/dL (ref 0.20–1.20)
BUN: 13.6 mg/dL (ref 7.0–26.0)
CALCIUM: 8.9 mg/dL (ref 8.4–10.4)
CO2: 25 mEq/L (ref 22–29)
CREATININE: 0.8 mg/dL (ref 0.6–1.1)
Chloride: 104 mEq/L (ref 98–109)
EGFR: >90 mL/min/{1.73_m2} (ref >90)
GLUCOSE: 136 mg/dL (ref 70–140)
Potassium: 3.8 mEq/L (ref 3.5–5.1)
SODIUM: 139 meq/L (ref 136–145)
TOTAL PROTEIN: 5.7 g/dL — AB (ref 6.4–8.3)

## 2016-10-07 LAB — CBC WITH DIFFERENTIAL/PLATELET
BASO%: 1 % (ref 0.0–2.0)
Basophils Absolute: 0 10*3/uL (ref 0.0–0.1)
EOS ABS: 0.2 10*3/uL (ref 0.0–0.5)
EOS%: 3.4 % (ref 0.0–7.0)
HEMATOCRIT: 40.6 % (ref 34.8–46.6)
HEMOGLOBIN: 13.1 g/dL (ref 11.6–15.9)
LYMPH#: 0.3 10*3/uL — AB (ref 0.9–3.3)
LYMPH%: 7.6 % — ABNORMAL LOW (ref 14.0–49.7)
MCH: 29.7 pg (ref 25.1–34.0)
MCHC: 32.3 g/dL (ref 31.5–36.0)
MCV: 92 fL (ref 79.5–101.0)
MONO#: 0.7 10*3/uL (ref 0.1–0.9)
MONO%: 15.8 % — ABNORMAL HIGH (ref 0.0–14.0)
NEUT%: 72.2 % (ref 38.4–76.8)
NEUTROS ABS: 3.2 10*3/uL (ref 1.5–6.5)
PLATELETS: 115 10*3/uL — AB (ref 145–400)
RBC: 4.41 10*6/uL (ref 3.70–5.45)
RDW: 17.1 % — AB (ref 11.2–14.5)
WBC: 4.4 10*3/uL (ref 3.9–10.3)

## 2016-10-07 MED ORDER — PROCHLORPERAZINE MALEATE 10 MG PO TABS
ORAL_TABLET | ORAL | Status: AC
  Filled 2016-10-07: qty 1

## 2016-10-07 MED ORDER — PROCHLORPERAZINE MALEATE 10 MG PO TABS
10.0000 mg | ORAL_TABLET | Freq: Once | ORAL | Status: AC
  Administered 2016-10-07: 10 mg via ORAL

## 2016-10-07 MED ORDER — METHYLPREDNISOLONE SODIUM SUCC 125 MG IJ SOLR
125.0000 mg | Freq: Once | INTRAMUSCULAR | Status: AC
  Administered 2016-10-07: 125 mg via INTRAVENOUS

## 2016-10-07 MED ORDER — SODIUM CHLORIDE 0.9 % IJ SOLN
10.0000 mL | INTRAMUSCULAR | Status: DC | PRN
  Administered 2016-10-07: 10 mL via INTRAVENOUS
  Filled 2016-10-07: qty 10

## 2016-10-07 MED ORDER — DARATUMUMAB CHEMO INJECTION 400 MG/20ML
15.7000 mg/kg | Freq: Once | INTRAVENOUS | Status: AC
  Administered 2016-10-07: 1100 mg via INTRAVENOUS
  Filled 2016-10-07: qty 40

## 2016-10-07 MED ORDER — DIPHENHYDRAMINE HCL 25 MG PO CAPS
ORAL_CAPSULE | ORAL | Status: AC
  Filled 2016-10-07: qty 2

## 2016-10-07 MED ORDER — ALPRAZOLAM 0.25 MG PO TABS: 0.2500 mg | ORAL_TABLET | Freq: Every evening | ORAL | 0 refills | Status: AC | PRN

## 2016-10-07 MED ORDER — METHYLPREDNISOLONE SODIUM SUCC 125 MG IJ SOLR
INTRAMUSCULAR | Status: AC
  Filled 2016-10-07: qty 2

## 2016-10-07 MED ORDER — HEPARIN SOD (PORK) LOCK FLUSH 100 UNIT/ML IV SOLN
500.0000 [IU] | Freq: Once | INTRAVENOUS | Status: AC | PRN
  Administered 2016-10-07: 500 [IU]
  Filled 2016-10-07: qty 5

## 2016-10-07 MED ORDER — SODIUM CHLORIDE 0.9% FLUSH
10.0000 mL | INTRAVENOUS | Status: DC | PRN
  Administered 2016-10-07: 10 mL
  Filled 2016-10-07: qty 10

## 2016-10-07 MED ORDER — SODIUM CHLORIDE 0.9 % IV SOLN
Freq: Once | INTRAVENOUS | Status: AC
  Administered 2016-10-07: 10:00:00 via INTRAVENOUS

## 2016-10-07 MED ORDER — DIPHENHYDRAMINE HCL 25 MG PO CAPS
50.0000 mg | ORAL_CAPSULE | Freq: Once | ORAL | Status: AC
  Administered 2016-10-07: 50 mg via ORAL

## 2016-10-07 MED ORDER — ACETAMINOPHEN 325 MG PO TABS
ORAL_TABLET | ORAL | Status: AC
  Filled 2016-10-07: qty 2

## 2016-10-07 MED ORDER — ACETAMINOPHEN 325 MG PO TABS
650.0000 mg | ORAL_TABLET | Freq: Once | ORAL | Status: AC
  Administered 2016-10-07: 650 mg via ORAL

## 2016-10-07 NOTE — Telephone Encounter (Signed)
Appointments complete per 12/6 los. Patient will get printout in infusion area - patient aware.

## 2016-10-07 NOTE — Assessment & Plan Note (Signed)
One of the recent, acute COPD exacerbation last year nearly caused respiratory failure to the point of death. She had recurrent admission to the hospital last year Again, I spent some time explaining to her the importance of nicotine cessation.

## 2016-10-07 NOTE — Assessment & Plan Note (Signed)
The patient had likely have cancer associated pain. In addition to the dexamethasone, she will continue on her chronic pain medicine as prescribed.

## 2016-10-07 NOTE — Assessment & Plan Note (Signed)
Her recent myeloma is pending I suspect a new rib pain is due to disease progression. She resumed Pomalyst recently The additional dexamethasone was helpful I will proceed with Daratumumab now I will call her next week with test results, with plan to start dexamethasone taper. She will continue calcium with vitamin D supplement. I will resume Zometa in 2 weeks

## 2016-10-07 NOTE — Assessment & Plan Note (Signed)
This is likely due to recent treatment. The patient denies recent history of bleeding such as epistaxis, hematuria or hematochezia. She is asymptomatic from the low platelet count. I will observe for now.  she does not require transfusion now. I will continue the chemotherapy at current dose without dosage adjustment.  If the thrombocytopenia gets progressive worse in the future, I might have to delay her treatment or adjust the chemotherapy dose.   

## 2016-10-07 NOTE — Progress Notes (Signed)
[place] OFFICE PROGRESS NOTE  Patient Care Team: Marin Olp, MD as PCP - General (Family Medicine) Jeanann Lewandowsky, MD as Consulting Physician (Medical Oncology) Marin Olp, MD as Consulting Physician (Family Medicine) Heath Lark, MD as Consulting Physician (Hematology and Oncology) [name], MD as Consulting Physician (Orthopedic Surgery)  SUMMARY OF ONCOLOGIC HISTORY: Oncology History   Multiple myeloma, kappa light chain disease, Durie-Salmon stage III       Multiple myeloma in remission ([place])   07/16/2006 Bone Marrow Biopsy    BM biopsy is non-diagnostic      09/21/2006 Procedure    L5 vertebral biopsy 5% plasma cell      10/25/2006 Bone Marrow Biopsy    BM biopsy is hypercellular with 5% plasma cell      11/17/2006 Procedure    L5 biopsy confirmed plasmacytoma      01/03/2007 - 01/10/2007 Radiation Therapy    Approximate date only, received RT for plasmacytoma followed by surgery      09/14/2007 Initial Diagnosis    MULTIPLE  MYELOMA      10/05/2007 Bone Marrow Biopsy    Bm biopsy was negative      06/18/2008 Bone Marrow Biopsy    BM biopsy was negative      07/26/2008 Bone Marrow Transplant    Stem cell transplant at [facility]      04/13/2011 Relapse/Recurrence    Disease relapse      04/14/2011 Bone Marrow Biopsy    Bm biopsy showed 2 % plasma cell      04/27/2011 Relapse/Recurrence    Disease relapse, treated with Velcade/Cytoxan/Dex      09/07/2011 - 07/28/2013 Chemotherapy    She has been receiving Velcade      12/27/2011 Bone Marrow Transplant    2nd transplant at [facility]      07/28/2013 Relapse/Recurrence    Chemo is stopped due to progression of disease      08/29/2013 Imaging    PEt/CT showed recurrence of disease with new lesion on her rib with compression fracture      09/13/2013 Bone Marrow Biopsy    BM biopsy is hypercellular with 6% plasma cell      10/10/2013 - 10/20/2013 Radiation Therapy   Started on palliative XRT for rib pain      11/27/2013 - 04/06/2014 Chemotherapy    The patient starts chemotherapy with Carfilzomib      07/19/2014 Imaging    Repeat bloodwork and PET/CT scan shows significant disease progression.      08/02/2014 - 03/06/2015 Chemotherapy    She enrolled in clinical trial using combination therapy with Revlimid, dexamethasone and Elotuzumab      03/07/2015 - 06/04/2015 Chemotherapy    She is on maintenance Revlimid only without dexamethasone      05/27/2015 Imaging     MRI spine showed new compression fracture.      06/04/2015 - 06/18/2015 Radiation Therapy     she received palliative radiation therapy to 25 Gy C7-T1      07/10/2015 - 08/07/2015 Chemotherapy    She received Elotuzumab, dex and Revlimid. Rx is stopped due to progression      08/01/2015 Imaging    MRI of the spine was performed today due to new onset of worsening right hip pain/flank area. MRI show mild progression of the L1 vertebral body.      08/21/2015 - 08/26/2015 Hospital Admission    She was admitted to the hospital due to pathologic fracture to  right femur      08/22/2015 Surgery    She had IM nail to fractured femur      08/23/2015 - 09/20/2015 Radiation Therapy    She received XRT to her L1 L2 and Right Femur      09/05/2015 - 09/06/2015 Hospital Admission    She had recurrent admission due to displaced fracture, managed conservatively      09/12/2015 - 09/17/2015 Hospital Admission    She had recurrent admission for COPD exacerbation      09/30/2015 -  Chemotherapy    She was started on Pomalyst      12/10/2015 -  Chemotherapy    She started weekly Daratumumab along with Pomalyst       INTERVAL HISTORY: Please see below for problem oriented charting. She returns for follow-up. Her rib pain is improving with additional steroid treatment and mild increased pain medicine. She continues to have mild cough. She has stop smoking due to cough. She was prescribed  antibiotics by her pulmonologist with improvement of her symptoms. She denies hemoptysis. She has healed completely from dental extraction in August  REVIEW OF SYSTEMS:   Constitutional: Denies fevers, chills or abnormal weight loss Eyes: Denies blurriness of vision Ears, nose, mouth, throat, and face: Denies mucositis or sore throat Cardiovascular: Denies palpitation, chest discomfort or lower extremity swelling Gastrointestinal:  Denies nausea, heartburn or change in bowel habits Skin: Denies abnormal skin rashes Lymphatics: Denies new lymphadenopathy or easy bruising Neurological:Denies numbness, tingling or new weaknesses Behavioral/Psych: Mood is stable, no new changes  All other systems were reviewed with the patient and are negative.  I have reviewed the past medical history, past surgical history, social history and family history with the patient and they are unchanged from previous note.  ALLERGIES:  is allergic to codeine; ibuprofen; and albuterol.  MEDICATIONS:  Current Outpatient Prescriptions  Medication Sig Dispense Refill  . acyclovir (ZOVIRAX) 400 MG tablet TAKE 1 TABLET BY MOUTH EVERY DAY 30 tablet 6  . ALPRAZolam (XANAX) 0.25 MG tablet Take 1 tablet (0.25 mg total) by mouth at bedtime as needed for anxiety. 60 tablet 0  . aspirin 81 MG tablet Take 81 mg by mouth daily.    Marland Kitchen azithromycin (ZITHROMAX) 250 MG tablet Take 1 tablet (250 mg total) by mouth as directed. 6 tablet 0  . chlorhexidine (PERIDEX) 0.12 % solution Rinse with 15 mls twice daily for 30 seconds. Use after breakfast and at bedtime. Spit out excess. Do not swallow. 480 mL prn  . cyclobenzaprine (FLEXERIL) 10 MG tablet Take 1 tablet (10 mg total) by mouth 3 (three) times daily as needed for muscle spasms. 15 tablet 0  . dexamethasone (DECADRON) 4 MG tablet Take 1 tablet (4 mg total) by mouth daily. 60 tablet 1  . fluticasone-salmeterol (ADVAIR HFA) 115-21 MCG/ACT inhaler Inhale 2 puffs into the lungs 2  (two) times daily. 1 Inhaler 5  . gabapentin (NEURONTIN) 600 MG tablet Take 1 tablet (600 mg total) by mouth 3 (three) times daily. 90 tablet 3  . ipratropium (ATROVENT HFA) 17 MCG/ACT inhaler Inhale 2 puffs into the lungs every 4 (four) hours as needed for wheezing. 1 Inhaler 12  . levothyroxine (SYNTHROID, LEVOTHROID) 50 MCG tablet TAKE 1 TABLET BY MOUTH EVERY DAY BEFORE BREAKFAST 90 tablet 1  . lidocaine-prilocaine (EMLA) cream Apply 1 application topically as needed. 30 g 6  . losartan (COZAAR) 50 MG tablet TAKE 1 TABLET (50 MG TOTAL) BY MOUTH DAILY. 90 tablet 1  .  morphine (MS CONTIN) 100 MG 12 hr tablet Take 100 mg by mouth every 8 (eight) hours.     . Oxycodone HCl 20 MG TABS     . pomalidomide (POMALYST) 2 MG capsule Take 1 capsule (2 mg total) by mouth daily. Take with water on days 1-21. Repeat every 28 days. 21 capsule 9  . prochlorperazine (COMPAZINE) 10 MG tablet Take 1 tablet (10 mg total) by mouth every 6 (six) hours as needed. for nausea 90 tablet 3  . Vitamin D, Ergocalciferol, (DRISDOL) 50000 units CAPS capsule Take 1 capsule (50,000 Units total) by mouth every 7 (seven) days. 12 capsule 3   No current facility-administered medications for this visit.    Facility-Administered Medications Ordered in Other Visits  Medication Dose Route Frequency Provider Last Rate Last Dose  . heparin lock flush 100 unit/mL  500 Units Intracatheter Once PRN Heath Lark, MD      . sodium chloride 0.9 % injection 10 mL  10 mL Intravenous PRN Heath Lark, MD   10 mL at 02/19/16 0911  . sodium chloride flush (NS) 0.9 % injection 10 mL  10 mL Intracatheter PRN Heath Lark, MD        PHYSICAL EXAMINATION: ECOG PERFORMANCE STATUS: 1 - Symptomatic but completely ambulatory  Vitals:   10/07/16 0944  BP: (!) 154/86  Pulse: 72  Resp: 17  Temp: 98.1 F (36.7 C)   Filed Weights   10/07/16 0944  Weight: 144 lb 9.6 oz (65.6 kg)    GENERAL:alert, no distress and comfortable SKIN: skin color,  texture, turgor are normal, no rashes or significant lesions EYES: normal, Conjunctiva are pink and non-injected, sclera clear OROPHARYNX:no exudate, no erythema and lips, buccal mucosa, and tongue normal  NECK: supple, thyroid normal size, non-tender, without nodularity LYMPH:  no palpable lymphadenopathy in the cervical, axillary or inguinal LUNGS: clear to auscultation and percussion with normal breathing effort HEART: regular rate & rhythm and no murmurs and no lower extremity edema ABDOMEN:abdomen soft, non-tender and normal bowel sounds Musculoskeletal:no cyanosis of digits and no clubbing  NEURO: alert & oriented x 3 with fluent speech, no focal motor/sensory deficits  LABORATORY DATA:  I have reviewed the data as listed    Component Value Date/Time   NA 139 10/07/2016 0906   K 3.8 10/07/2016 0906   CL 107 09/11/2015 2255   CL 107 03/10/2013 1014   CO2 25 10/07/2016 0906   GLUCOSE 136 10/07/2016 0906   GLUCOSE 111 (H) 03/10/2013 1014   BUN 13.6 10/07/2016 0906   CREATININE 0.8 10/07/2016 0906   CALCIUM 8.9 10/07/2016 0906   PROT 5.7 (L) 10/07/2016 0906   ALBUMIN 3.1 (L) 10/07/2016 0906   AST 72 (H) 10/07/2016 0906   ALT 99 (H) 10/07/2016 0906   ALKPHOS 89 10/07/2016 0906   BILITOT 0.49 10/07/2016 0906   GFRNONAA >60 09/12/2015 0620   GFRAA >60 09/12/2015 0620    No results found for: SPEP, UPEP  Lab Results  Component Value Date   WBC 4.4 10/07/2016   NEUTROABS 3.2 10/07/2016   HGB 13.1 10/07/2016   HCT 40.6 10/07/2016   MCV 92.0 10/07/2016   PLT 115 (L) 10/07/2016      Chemistry      Component Value Date/Time   NA 139 10/07/2016 0906   K 3.8 10/07/2016 0906   CL 107 09/11/2015 2255   CL 107 03/10/2013 1014   CO2 25 10/07/2016 0906   BUN 13.6 10/07/2016 0906   CREATININE 0.8  10/07/2016 0906      Component Value Date/Time   CALCIUM 8.9 10/07/2016 0906   ALKPHOS 89 10/07/2016 0906   AST 72 (H) 10/07/2016 0906   ALT 99 (H) 10/07/2016 0906   BILITOT  0.49 10/07/2016 0906       ASSESSMENT & PLAN:  Multiple myeloma in remission ([place]) Her recent myeloma is pending I suspect a new rib pain is due to disease progression. She resumed Pomalyst recently The additional dexamethasone was helpful I will proceed with Daratumumab now I will call her next week with test results, with plan to start dexamethasone taper. She will continue calcium with vitamin D supplement. I will resume Zometa in 2 weeks  Thrombocytopenia ([place]) This is likely due to recent treatment. The patient denies recent history of bleeding such as epistaxis, hematuria or hematochezia. She is asymptomatic from the low platelet count. I will observe for now.  she does not require transfusion now. I will continue the chemotherapy at current dose without dosage adjustment.  If the thrombocytopenia gets progressive worse in the future, I might have to delay her treatment or adjust the chemotherapy dose.    Cancer associated pain The patient had likely have cancer associated pain. In addition to the dexamethasone, she will continue on her chronic pain medicine as prescribed.  Cigarette smoker One of the recent, acute COPD exacerbation last year nearly caused respiratory failure to the point of death. She had recurrent admission to the hospital last year Again, I spent some time explaining to her the importance of nicotine cessation.   Orders Placed This Encounter  Procedures  . Kappa/lambda light chains    Standing Status:   Future    Standing Expiration Date:   11/11/2017  . Multiple Myeloma Panel (SPEP&IFE w/QIG)    Standing Status:   Future    Standing Expiration Date:   11/11/2017   All questions were answered. The patient knows to call the clinic with any problems, questions or concerns. No barriers to learning was detected. I spent 25 minutes counseling the patient face to face. The total time spent in the appointment was 30 minutes and more than 50% was on counseling  and review of test results     Heath Lark, MD 10/07/2016 2:31 PM

## 2016-10-07 NOTE — Progress Notes (Signed)
Dr. Alvy Bimler aware of elevated LFT's. Advised ok to treat.

## 2016-10-07 NOTE — Patient Instructions (Signed)
[facility] Discharge Instructions for Patients Receiving Chemotherapy  Today you received the following chemotherapy agents: darzalex  To help prevent nausea and vomiting after your treatment, we encourage you to take your nausea medication as prescribed. If you develop nausea and vomiting that is not controlled by your nausea medication, call the clinic.   BELOW ARE SYMPTOMS THAT SHOULD BE REPORTED IMMEDIATELY:  *FEVER GREATER THAN 100.5 F  *CHILLS WITH OR WITHOUT FEVER  NAUSEA AND VOMITING THAT IS NOT CONTROLLED WITH YOUR NAUSEA MEDICATION  *UNUSUAL SHORTNESS OF BREATH  *UNUSUAL BRUISING OR BLEEDING  TENDERNESS IN MOUTH AND THROAT WITH OR WITHOUT PRESENCE OF ULCERS  *URINARY PROBLEMS  *BOWEL PROBLEMS  UNUSUAL RASH Items with * indicate a potential emergency and should be followed up as soon as possible.  Feel free to call the clinic you have any questions or concerns. The clinic phone number is [phone].  Please show the [facility] at check-in to the Emergency Department and triage nurse.

## 2016-10-08 ENCOUNTER — Telehealth: Payer: Self-pay | Admitting: *Deleted

## 2016-10-08 LAB — KAPPA/LAMBDA LIGHT CHAINS
Ig Kappa Free Light Chain: 101.4 mg/L — ABNORMAL HIGH (ref 3.3–19.4)
Ig Lambda Free Light Chain: 6.3 mg/L (ref 5.7–26.3)
KAPPA/LAMBDA FLC RATIO: 16.1 — AB (ref 0.26–1.65)

## 2016-10-08 NOTE — Telephone Encounter (Signed)
Pt left VM states she thought Dr. Alvy Bimler was going to send Rx for antibiotic to [facility] on [address].  Says they did not get any Antibiotic Rx.

## 2016-10-08 NOTE — Telephone Encounter (Signed)
She told me she had finished antibiotics prescribed by pulmonologist I did not prescribe

## 2016-10-08 NOTE — Telephone Encounter (Signed)
Informed pt Dr. Alvy Bimler just wanted her to complete the Zithromax from her Pulmonary.   Pt says she just wanted to make sure she wasn't supposed to be starting on another antibiotic from Dr. Alvy Bimler.  She understands now and will call back if her symptoms worsen.

## 2016-10-12 ENCOUNTER — Telehealth: Payer: Self-pay | Admitting: Internal Medicine

## 2016-10-12 LAB — MULTIPLE MYELOMA PANEL, SERUM
ALBUMIN/GLOB SERPL: 1.9 — AB (ref 0.7–1.7)
ALPHA2 GLOB SERPL ELPH-MCNC: 0.6 g/dL (ref 0.4–1.0)
Albumin SerPl Elph-Mcnc: 3.4 g/dL (ref 2.9–4.4)
Alpha 1: 0.2 g/dL (ref 0.0–0.4)
B-Globulin SerPl Elph-Mcnc: 0.7 g/dL (ref 0.7–1.3)
Gamma Glob SerPl Elph-Mcnc: 0.3 g/dL — ABNORMAL LOW (ref 0.4–1.8)
Globulin, Total: 1.8 g/dL — ABNORMAL LOW (ref 2.2–3.9)
IGA/IMMUNOGLOBULIN A, SERUM: 25 mg/dL — AB (ref 87–352)
IGM (IMMUNOGLOBIN M), SRM: 16 mg/dL — AB (ref 26–217)
IgG, Qn, Serum: 389 mg/dL — ABNORMAL LOW (ref 700–1600)
M Protein SerPl Elph-Mcnc: 0.1 g/dL — ABNORMAL HIGH
Total Protein: 5.2 g/dL — ABNORMAL LOW (ref 6.0–8.5)

## 2016-10-12 NOTE — Telephone Encounter (Signed)
MW  Please Advise- Sick Message  Pt. Called in c/o coughing a lot but unable to produce anything, increase sob,wheezing, chest tightness, states she has tried taking mucinex but not much relief.Denies fever. Wanted to know if something could be called in.

## 2016-10-12 NOTE — Telephone Encounter (Signed)
Pt scheduled with MW on 10-13-16. Pt aware & voiced understanding. Nothing further needed.

## 2016-10-12 NOTE — Telephone Encounter (Signed)
Ov with all meds as workin to see me or NP

## 2016-10-13 ENCOUNTER — Ambulatory Visit (INDEPENDENT_AMBULATORY_CARE_PROVIDER_SITE_OTHER)
Admission: RE | Admit: 2016-10-13 | Discharge: 2016-10-13 | Disposition: A | Discharge status: Home / Self Care | Payer: Medicare Other | Source: Ambulatory Visit | Attending: Internal Medicine | Admitting: Internal Medicine

## 2016-10-13 ENCOUNTER — Encounter: Payer: Self-pay | Admitting: Internal Medicine

## 2016-10-13 ENCOUNTER — Ambulatory Visit (INDEPENDENT_AMBULATORY_CARE_PROVIDER_SITE_OTHER): Payer: Medicare Other | Admitting: Internal Medicine

## 2016-10-13 VITALS — BP 148/90 | HR 74 | Ht 64.0 in | Wt 145.2 lb

## 2016-10-13 DIAGNOSIS — R05 Cough: Secondary | ICD-10-CM

## 2016-10-13 DIAGNOSIS — F17210 Nicotine dependence, cigarettes, uncomplicated: Secondary | ICD-10-CM

## 2016-10-13 DIAGNOSIS — I10 Essential (primary) hypertension: Secondary | ICD-10-CM | POA: Diagnosis not present

## 2016-10-13 DIAGNOSIS — J449 Chronic obstructive pulmonary disease, unspecified: Secondary | ICD-10-CM

## 2016-10-13 DIAGNOSIS — R059 Cough, unspecified: Secondary | ICD-10-CM

## 2016-10-13 DIAGNOSIS — R0602 Shortness of breath: Secondary | ICD-10-CM | POA: Diagnosis not present

## 2016-10-13 MED ORDER — BUDESONIDE-FORMOTEROL FUMARATE 160-4.5 MCG/ACT IN AERO: INHALATION_SPRAY | RESPIRATORY_TRACT | Status: DC

## 2016-10-13 MED ORDER — AMOXICILLIN-POT CLAVULANATE 875-125 MG PO TABS: 1.0000 | ORAL_TABLET | Freq: Two times a day (BID) | ORAL | 0 refills | Status: AC

## 2016-10-13 MED ORDER — ALBUTEROL SULFATE HFA 108 (90 BASE) MCG/ACT IN AERS: INHALATION_SPRAY | RESPIRATORY_TRACT | 0 refills | Status: AC

## 2016-10-13 MED ORDER — CLONIDINE HCL 0.1 MG PO TABS: 0.1000 mg | ORAL_TABLET | Freq: Two times a day (BID) | ORAL | 11 refills | Status: AC

## 2016-10-13 NOTE — Progress Notes (Signed)
Subjective:    Patient ID: Kendra Johns, female    DOB: 10/23/1951  MRN: [id number]     Brief patient profile:  89 yowf active smoker with MM s/p stem cell 2009 and 2013 with recurrence requiring chemo since 2013 but not working so RT to   lower ribs both sides Dec 2014  started new treatment of Jan 2015 and monthly until Jun 5th  p doe which started in April, worse  By end of May  ? Better since late May (? p d/c ACEi?)with last rx June 5th referred by Dr Sherren Mocha for eval of sob.   History of Present Illness  From chart: 10/10/2013 - 10/20/2013  Radiation Therapy  Started on palliative XRT for rib pain  11/27/2013  Carfilzomib d/c 04/06/14 last dose   05/16/2014 1st [facility] office visit/ Eissa Buchberger  Chief Complaint  Patient presents with  . Pulmonary Consult    Referred per Dr. Sherren Mocha. Pt c/o SOB for since started on chemo in March 2015.  She states that she is SOB with or without exertion.  She gets out of breath with walking approx 100 ft.   whereas at one point could not do the aisle at [facility] and now can . No cough since off ACEi  Not using saba now but seemed to help cough and congestion when she was at her worst. Not sure symbicort helped No purulent or excessive mucus rec Please remember to go to the x-ray department downstairs for your tests - we will call you with the results when they are available. The key is to stop smoking completely before smoking completely stops you!  Please schedule a follow up office visit in 6 weeks, call sooner if needed - do not restart the lisinopril in meantime as there are plenty of other choices if your blood pressure rises again - see me or Dr Sherren Mocha if needed in meantime Late add : pfts scheduled for return    09/21/2014 acute ov/Forever Arechiga re: active smoker/ on ACEi/ refractory cough and wheeze  Chief Complaint  Patient presents with  . Acute Visit    Pt c/o SOB and cough for the past 10 days. Cough is prod with brown/blood tinged sputum.   rx  by ED x 7 days prior to OV  Zpak/ prednisone some better using both saba hfa and neb > 4 x daily since acute onset on symptoms 10 d prior to OV   rec I strongly recommend you stop lisinopril  Benicar 40 mg one daily x 2 weeks Augmentin 875 mg take one pill twice daily  X 10 days Mucinex dm 1200 mg every 12 hours as needed If still short or breath or cough use atrovent hfa 2 pffs every 4 hours as needed  If still coughing >  Try prilosec 20mg   Take 30-60 min before first meal of the day and Pepcid 20 mg one bedtime until cough is completely gone for at least a week without the need for cough suppression. Please schedule a follow up office visit in 2 weeks to see Tammy NP, sooner if needed and she will check your blood pressure and be sure you are better and see me for pfts>  Did not return as improved somewhat    01/15/2016  reconsult per Dr Yong Channel Melvyn Novas re: active smoker maint rx spiriva and prn atrovent due to "albuterol allergy" but note has used alb neb fine  Chief Complaint  Patient presents with  . Advice Only  Pt c/o continued cough with clear/white/brown mucus, wheeze and SOB. Pt states that she does feel she has improved some.   breathing worse since  Indolent onset 09/2015 gradually progressive to point x  across house / no early am or noct cough/congestion wheeze rec The key is to stop smoking completely before smoking completely stops you - ok to use the e cigs as a one way bridge off all tobacco products Plan A = Automatic =  Symbicort 160 Take 2 puffs first thing in am and then another 2 puffs about 12 hours later.  Work on inhaler technique:   Plan B = backup  Only use your atrovent hfa     05/29/2016  f/u ov/Zakariyah Freimark re:  GOLD I copd/ still smoking  On advair 2 bid / atrovent prn but but not using  Chief Complaint  Patient presents with  . Follow-up    PFT done today. Pt c/o continued cough with white/brown mucus, SOB with exertion. Pt denies wheeze/CP/tightness.   congested  cough all day Not limited by breathing but by leg pains  rec Smoke  Plan A = Automatic =  Advair Take 2 puffs first thing in am and then another 2 puffs about 12 hours later.  Plan B = Backup Only use your atovent  as a rescue medication    10/13/2016  Acute extended  ov/Hiba Garry re: sob and cough / still smoking on dex 4 mg daily   Chief Complaint  Patient presents with  . Acute Visit    Pt c/o increased SOB, wheezing and cough x 2 wks, worse for the past wk. Cough has been prod with dark red sputum.    cough completely resolved from prev visit on prn advair  Then got up too fast from recliner 6 weeks prior to OV  With immediate pain in R chest wall > seen by Primary   no change in rx and gradually diminished pain but then worsened again x 24 prior to OV  In same spot aggravated by coughing  Then last week in nov coughing > brownish > zpak "dried it up" until one day prior to OV  Started with red tinged mucus but no frank hemoptysis    No obvious day to day or daytime variabilty or assoc  chest tightness,   overt sinus or hb symptoms. No unusual exp hx or h/o childhood pna/ asthma or knowledge of premature birth.  Sleeping ok without nocturnal  or early am exacerbation  of respiratory  c/o's or need for noct saba. Also denies any obvious fluctuation of symptoms with weather or environmental changes or other aggravating or alleviating factors except as outlined above   Current Medications, Allergies, Complete Past Medical History, Past Surgical History, Family History, and Social History were reviewed in [employer] record.  ROS  The following are not active complaints unless bolded sore throat, dysphagia, dental problems, itching, sneezing,  nasal congestion or excess/ purulent secretions, ear ache,   fever, chills, sweats, unintended wt loss, pleuritic or exertional cp, hemoptysis,  orthopnea pnd or leg swelling, presyncope, palpitations, heartburn, abdominal pain,  anorexia, nausea, vomiting, diarrhea  or change in bowel or urinary habits, change in stools or urine, dysuria,hematuria,  rash, arthralgias, visual complaints, headache, numbness weakness or ataxia or problems with walking or coordination,  change in mood/affect or memory.              Objective:   Physical Exam  amb wf nad   10/13/2016  145 05/29/2016        140  01/15/2016        164   09/21/14 123 lb (55.792 kg)  09/14/14 116 lb (52.617 kg)  08/30/14 119 lb 9.6 oz (54.25 kg)    Vital signs reviewed - Note on arrival 02 sats  98% on RA      HEENT: nl  turbinates, and orophanx. Nl external ear canals without cough reflex - edentulous   NECK :  without JVD/Nodes/TM/ nl carotid upstrokes bilaterally   LUNGS: no acc muscle use,   Junky rhonchi bilaterally    CV:  RRR  no s3 or murmur or increase in P2, no edema   ABD:  soft and nontender with nl excursion in the supine position. No bruits or organomegaly, bowel sounds nl  MS:  warm without deformities, calf tenderness, cyanosis or clubbing  SKIN: warm and dry without lesions    NEURO:  alert, approp, no deficits     CXR PA and Lateral:   10/13/2016 :    I personally reviewed images and agree with radiology impression as follows:    The lungs are mildly hyperinflated. The interstitial markings are coarse but stable. The heart and pulmonary vascularity are normal. There is calcification in the wall of the aortic arch. The power port catheter tip projects over the midportion of the SVC. There is no pleural effusion. There are multiple old right posterior rib fractures. There is subtle contour deformity of the lateral aspect of the left 6 rib which is chronic as well. There is old partial compression of the bodies of T5 and T6.       Assessment & Plan:

## 2016-10-13 NOTE — Patient Instructions (Addendum)
Try prilosec otc 20mg   Take 30-60 min before first meal of the day and Pepcid ac (famotidine) 20 mg one @  bedtime until cough is completely gone for at least a week without the need for cough suppression  GERD (REFLUX)  is an extremely common cause of respiratory symptoms just like yours , many times with no obvious heartburn at all.    It can be treated with medication, but also with lifestyle changes including elevation of the head of your bed (ideally with 6 inch  bed blocks),  Smoking cessation, avoidance of late meals, excessive alcohol, and avoid fatty foods, chocolate, peppermint, colas, red wine, and acidic juices such as orange juice.  NO MINT OR MENTHOL PRODUCTS SO NO COUGH DROPS   USE SUGARLESS CANDY INSTEAD (Jolley ranchers or Stover's or Life Savers) or even ice chips will also do - the key is to swallow to prevent all throat clearing. NO OIL BASED VITAMINS - use powdered substitutes.  Double the dexamethasone until better then one daily     Stop advair and take symbicort 160 Take 2 puffs first thing in am and then another 2 puffs about 12 hours later.   Only use your albuterol as a rescue medication to be used if you can't catch your breath by resting or doing a relaxed purse lip breathing pattern.  - The less you use it, the better it will work when you need it. - Ok to use up to 2 puffs  every 4 hours if you must but call for immediate appointment if use goes up over your usual need - Don't leave home without it !!  (think of it like the spare tire for your car)   Please remember to go to the  x-ray department downstairs for your tests - we will call you with the results when they are available.  For cough > mucinex dm 1200 mg every 12 hours   Stop cozar  Start clonidine 0.1 twice daily in place of cozar   Augmentin 875 mg take one pill twice daily  X 10 days - take at breakfast and supper with large glass of water.  It would help reduce the usual side effects (diarrhea  and yeast infections) if you ate cultured yogurt at lunch.   Return in 10 days with all meds in hand - if you deteriorate in meantime go to ER

## 2016-10-13 NOTE — Progress Notes (Signed)
Spoke with pt and notified of results per Dr. Wert. Pt verbalized understanding and denied any questions. 

## 2016-10-14 ENCOUNTER — Other Ambulatory Visit: Payer: Self-pay | Admitting: *Deleted

## 2016-10-14 DIAGNOSIS — D696 Thrombocytopenia, unspecified: Secondary | ICD-10-CM

## 2016-10-14 DIAGNOSIS — C9001 Multiple myeloma in remission: Secondary | ICD-10-CM

## 2016-10-14 MED ORDER — POMALIDOMIDE 2 MG PO CAPS: 2.0000 mg | ORAL_CAPSULE | Freq: Every day | ORAL | 9 refills | Status: DC

## 2016-10-15 ENCOUNTER — Telehealth: Payer: Self-pay | Admitting: *Deleted

## 2016-10-15 ENCOUNTER — Encounter: Payer: Self-pay | Admitting: Internal Medicine

## 2016-10-15 NOTE — Assessment & Plan Note (Signed)
Pt d/c acei ? May 2015 on her own > cough and breathing improved by time of pulmonary eval 05/17/2014  - rec she stop acei indefinitely @ pulmonary ov 09/21/14  - changed arb to clonidine 10/13/2016    See comments above re arb use - clonidine probably better choice anyway in pt with chronic pain

## 2016-10-15 NOTE — Assessment & Plan Note (Signed)
See copd ddx  

## 2016-10-15 NOTE — Telephone Encounter (Signed)
Pt took last Pomalyst yesterday and is on her 7 days off, due to resume on 12/26.   Informed pt of Rx sent electronically to [facility] yesterday.  Please let us know if any problems. She verbalized understanding.

## 2016-10-15 NOTE — Assessment & Plan Note (Signed)

## 2016-10-15 NOTE — Assessment & Plan Note (Addendum)
- 01/15/2016  extensive coaching HFA effectiveness =    75% > try symbicort 160 2bid > return for pfts  - PFT's  05/29/2016  FEV1 1.62 (91 % ) ratio 64  p no % improvement from saba p nothing prior to study with DLCO  47/50 % corrects to 94 % for alv volume  - 10/13/2016  After extensive coaching HFA effectiveness =    75% > changed from advair dpi to symbicort 160 2bid    DDX of  difficult airways management almost all start with A and  include Adherence, Ace Inhibitors, Acid Reflux, Active Sinus Disease, Alpha 1 Antitripsin deficiency, Anxiety masquerading as Airways dz,  ABPA,  Allergy(esp in young), Aspiration (esp in elderly), Adverse effects of meds,  Active smokers, A bunch of PE's (a small clot burden can't cause this syndrome unless there is already severe underlying pulm or vascular dz with poor reserve) plus two Bs  = Bronchiectasis and Beta blocker use..and one C= CHF  Adherence is always the initial "prime suspect" and is a multilayered concern that requires a "trust but verify" approach in every patient - starting with knowing how to use medications, especially inhalers, correctly, keeping up with refills and understanding the fundamental difference between maintenance and prns vs those medications only taken for a very short course and then stopped and not refilled.  - - The proper method of use, as well as anticipated side effects, of a metered-dose inhaler are discussed and demonstrated to the patient. Improved effectiveness after extensive coaching during this visit to a level of approximately 75 % from a baseline of 50 % should do better on hfa than dpi since cough so prominent    Active smoker (see separate a/p)   ? Acid (or non-acid) GERD > always difficult to exclude as up to 75% of pts in some series report no assoc GI/ Heartburn symptoms> rec max (24h)  acid suppression and diet restrictions/ reviewed and instructions given in writing.   ? Allergy component > double dex until  better then resume 4 mg daily   ? Adverse effects of dpi > change to symbicort 160 2bid  ? A bunch of pe's > very unlikely since cp was immediate p sudden movement 6 weeks ago and resolved and now back in exact same location as before but aggravated with coughing > rx with opioids   ? Active sinus dz > augmentin should cover  ? Ace > For reasons that may related to vascular permability and nitric oxide pathways but not elevated  bradykinin levels (as seen with  ACEi use) losartan in the generic form has been reported now from mulitple sources  to cause a similar pattern of non-specific  upper airway symptoms as seen with acei.   This has not been reported with exposure to the other ARB's to date, so it seems reasonable for now to try either generic diovan or avapro if ARB needed or use an alternative class altogether.  See:  Lelon Frohlich Allergy Asthma Immunol  2008: 101: p 495-499   Try clonidine 0.1 mg bid instead of cozar, should help opioids work better also    I had an extended discussion with the patient reviewing all relevant studies completed to date and  lasting 25 minutes of a 40  Minute acute  office visit  re  non-specific but potentially very serious pulmonary symptoms of unknown etiology.  Each maintenance medication was reviewed in detail including most importantly the difference between maintenance and prns  and under what circumstances the prns are to be triggered using an action plan format that is not reflected in the computer generated alphabetically organized AVS.    Please see AVS for unique instructions that I personally wrote and verbalized to the the pt in detail and then reviewed with pt  by my nurse highlighting any  changes in therapy recommended at today's visit to their plan of care.    Will see in 2 weeks with all meds in hand using a trust but verify approach to confirm accurate Medication  Reconciliation The principal here is that until we are certain that the  patients are  doing what we've asked, it makes no sense to ask them to do more.

## 2016-10-19 ENCOUNTER — Other Ambulatory Visit: Payer: Self-pay | Admitting: Hematology and Oncology

## 2016-10-21 ENCOUNTER — Other Ambulatory Visit (HOSPITAL_BASED_OUTPATIENT_CLINIC_OR_DEPARTMENT_OTHER): Payer: Medicare Other

## 2016-10-21 ENCOUNTER — Ambulatory Visit: Payer: Medicare Other

## 2016-10-21 VITALS — BP 173/118 | HR 72 | Temp 99.0°F | Resp 20

## 2016-10-21 DIAGNOSIS — C9001 Multiple myeloma in remission: Secondary | ICD-10-CM | POA: Diagnosis present

## 2016-10-21 DIAGNOSIS — C9002 Multiple myeloma in relapse: Secondary | ICD-10-CM

## 2016-10-21 DIAGNOSIS — I10 Essential (primary) hypertension: Secondary | ICD-10-CM

## 2016-10-21 LAB — CBC WITH DIFFERENTIAL/PLATELET
BASO%: 1.9 % (ref 0.0–2.0)
Basophils Absolute: 0.1 10*3/uL (ref 0.0–0.1)
EOS ABS: 0 10*3/uL (ref 0.0–0.5)
EOS%: 0.9 % (ref 0.0–7.0)
HCT: 47.5 % — ABNORMAL HIGH (ref 34.8–46.6)
HGB: 15.5 g/dL (ref 11.6–15.9)
LYMPH%: 21.3 % (ref 14.0–49.7)
MCH: 30 pg (ref 25.1–34.0)
MCHC: 32.6 g/dL (ref 31.5–36.0)
MCV: 92.1 fL (ref 79.5–101.0)
MONO#: 0.9 10*3/uL (ref 0.1–0.9)
MONO%: 22.5 % — AB (ref 0.0–14.0)
NEUT%: 53.4 % (ref 38.4–76.8)
NEUTROS ABS: 2.1 10*3/uL (ref 1.5–6.5)
Platelets: 206 10*3/uL (ref 145–400)
RBC: 5.16 10*6/uL (ref 3.70–5.45)
RDW: 18 % — ABNORMAL HIGH (ref 11.2–14.5)
WBC: 4 10*3/uL (ref 3.9–10.3)
lymph#: 0.8 10*3/uL — ABNORMAL LOW (ref 0.9–3.3)

## 2016-10-21 LAB — COMPREHENSIVE METABOLIC PANEL
ALT: 131 U/L — ABNORMAL HIGH (ref 0–55)
ANION GAP: 8 meq/L (ref 3–11)
AST: 83 U/L — ABNORMAL HIGH (ref 5–34)
Albumin: 3.4 g/dL — ABNORMAL LOW (ref 3.5–5.0)
Alkaline Phosphatase: 98 U/L (ref 40–150)
BUN: 14.4 mg/dL (ref 7.0–26.0)
CHLORIDE: 106 meq/L (ref 98–109)
CO2: 24 meq/L (ref 22–29)
Calcium: 9.3 mg/dL (ref 8.4–10.4)
Creatinine: 0.7 mg/dL (ref 0.6–1.1)
GLUCOSE: 103 mg/dL (ref 70–140)
POTASSIUM: 3.7 meq/L (ref 3.5–5.1)
SODIUM: 138 meq/L (ref 136–145)
Total Bilirubin: 0.57 mg/dL (ref 0.20–1.20)
Total Protein: 6.3 g/dL — ABNORMAL LOW (ref 6.4–8.3)

## 2016-10-21 MED ORDER — SODIUM CHLORIDE 0.9 % IJ SOLN
10.0000 mL | INTRAMUSCULAR | Status: DC | PRN
  Administered 2016-10-21: 10 mL via INTRAVENOUS
  Filled 2016-10-21: qty 10

## 2016-10-21 MED ORDER — CLONIDINE HCL 0.1 MG PO TABS
0.1000 mg | ORAL_TABLET | Freq: Once | ORAL | Status: AC
  Administered 2016-10-21: 0.1 mg via ORAL

## 2016-10-21 MED ORDER — HEPARIN SOD (PORK) LOCK FLUSH 100 UNIT/ML IV SOLN
500.0000 [IU] | Freq: Once | INTRAVENOUS | Status: AC | PRN
  Administered 2016-10-21: 500 [IU] via INTRAVENOUS
  Filled 2016-10-21: qty 5

## 2016-10-21 NOTE — Patient Instructions (Signed)
Implanted Port Home Guide An implanted port is a type of central line that is placed under the skin. Central lines are used to provide IV access when treatment or nutrition needs to be given through a person's veins. Implanted ports are used for long-term IV access. An implanted port may be placed because:   You need IV medicine that would be irritating to the small veins in your hands or arms.   You need long-term IV medicines, such as antibiotics.   You need IV nutrition for a long period.   You need frequent blood draws for lab tests.   You need dialysis.  Implanted ports are usually placed in the chest area, but they can also be placed in the upper arm, the abdomen, or the leg. An implanted port has two main parts:   Reservoir. The reservoir is round and will appear as a small, raised area under your skin. The reservoir is the part where a needle is inserted to give medicines or draw blood.   Catheter. The catheter is a thin, flexible tube that extends from the reservoir. The catheter is placed into a large vein. Medicine that is inserted into the reservoir goes into the catheter and then into the vein.  HOW WILL I CARE FOR MY INCISION SITE? Do not get the incision site wet. Bathe or shower as directed by your health care provider.  HOW IS MY PORT ACCESSED? Special steps must be taken to access the port:   Before the port is accessed, a numbing cream can be placed on the skin. This helps numb the skin over the port site.   Your health care provider uses a sterile technique to access the port.  Your health care provider must put on a mask and sterile gloves.  The skin over your port is cleaned carefully with an antiseptic and allowed to dry.  The port is gently pinched between sterile gloves, and a needle is inserted into the port.  Only "non-coring" port needles should be used to access the port. Once the port is accessed, a blood return should be checked. This helps  ensure that the port is in the vein and is not clogged.   If your port needs to remain accessed for a constant infusion, a clear (transparent) bandage will be placed over the needle site. The bandage and needle will need to be changed every week, or as directed by your health care provider.   Keep the bandage covering the needle clean and dry. Do not get it wet. Follow your health care provider's instructions on how to take a shower or bath while the port is accessed.   If your port does not need to stay accessed, no bandage is needed over the port.  WHAT IS FLUSHING? Flushing helps keep the port from getting clogged. Follow your health care provider's instructions on how and when to flush the port. Ports are usually flushed with saline solution or a medicine called heparin. The need for flushing will depend on how the port is used.   If the port is used for intermittent medicines or blood draws, the port will need to be flushed:   After medicines have been given.   After blood has been drawn.   As part of routine maintenance.   If a constant infusion is running, the port may not need to be flushed.  HOW LONG WILL MY PORT STAY IMPLANTED? The port can stay in for as long as your health care   provider thinks it is needed. When it is time for the port to come out, surgery will be done to remove it. The procedure is similar to the one performed when the port was put in.  WHEN SHOULD I SEEK IMMEDIATE MEDICAL CARE? When you have an implanted port, you should seek immediate medical care if:   You notice a bad smell coming from the incision site.   You have swelling, redness, or drainage at the incision site.   You have more swelling or pain at the port site or the surrounding area.   You have a fever that is not controlled with medicine. This information is not intended to replace advice given to you by your health care provider. Make sure you discuss any questions you have with  your health care provider. Document Released: 10/19/2005 Document Revised: 08/09/2013 Document Reviewed: 06/26/2013 Elsevier Interactive Patient Education  2017 [employer]  Hypertension Hypertension, commonly called high blood pressure, is when the force of blood pumping through your arteries is too strong. Your arteries are the blood vessels that carry blood from your heart throughout your body. A blood pressure reading consists of a higher number over a lower number, such as 110/72. The higher number (systolic) is the pressure inside your arteries when your heart pumps. The lower number (diastolic) is the pressure inside your arteries when your heart relaxes. Ideally you want your blood pressure below 120/80. Hypertension forces your heart to work harder to pump blood. Your arteries may become narrow or stiff. Having untreated or uncontrolled hypertension can cause heart attack, stroke, kidney disease, and other problems. What increases the risk? Some risk factors for high blood pressure are controllable. Others are not. Risk factors you cannot control include:  Race. You may be at higher risk if you are African American.  Age. Risk increases with age.  Gender. Men are at higher risk than women before age 84 years. After age 19, women are at higher risk than men. Risk factors you can control include:  Not getting enough exercise or physical activity.  Being overweight.  Getting too much fat, sugar, calories, or salt in your diet.  Drinking too much alcohol. What are the signs or symptoms? Hypertension does not usually cause signs or symptoms. Extremely high blood pressure (hypertensive crisis) may cause headache, anxiety, shortness of breath, and nosebleed. How is this diagnosed? To check if you have hypertension, your health care provider will measure your blood pressure while you are seated, with your arm held at the level of your heart. It should be measured at least twice using  the same arm. Certain conditions can cause a difference in blood pressure between your right and left arms. A blood pressure reading that is higher than normal on one occasion does not mean that you need treatment. If it is not clear whether you have high blood pressure, you may be asked to return on a different day to have your blood pressure checked again. Or, you may be asked to monitor your blood pressure at home for 1 or more weeks. How is this treated? Treating high blood pressure includes making lifestyle changes and possibly taking medicine. Living a healthy lifestyle can help lower high blood pressure. You may need to change some of your habits. Lifestyle changes may include:  Following the DASH diet. This diet is high in fruits, vegetables, and whole grains. It is low in salt, red meat, and added sugars.  Keep your sodium intake below 2,300 mg per  day.  Getting at least 30-45 minutes of aerobic exercise at least 4 times per week.  Losing weight if necessary.  Not smoking.  Limiting alcoholic beverages.  Learning ways to reduce stress. Your health care provider may prescribe medicine if lifestyle changes are not enough to get your blood pressure under control, and if one of the following is true:  You are 31-42 years of age and your systolic blood pressure is above 140.  You are 32 years of age or older, and your systolic blood pressure is above 150.  Your diastolic blood pressure is above 90.  You have diabetes, and your systolic blood pressure is over [id number] or your diastolic blood pressure is over 90.  You have kidney disease and your blood pressure is above 140/90.  You have heart disease and your blood pressure is above 140/90. Your personal target blood pressure may vary depending on your medical conditions, your age, and other factors. Follow these instructions at home:  Have your blood pressure rechecked as directed by your health care provider.  Take medicines only  as directed by your health care provider. Follow the directions carefully. Blood pressure medicines must be taken as prescribed. The medicine does not work as well when you skip doses. Skipping doses also puts you at risk for problems.  Do not smoke.  Monitor your blood pressure at home as directed by your health care provider. Contact a health care provider if:  You think you are having a reaction to medicines taken.  You have recurrent headaches or feel dizzy.  You have swelling in your ankles.  You have trouble with your vision. Get help right away if:  You develop a severe headache or confusion.  You have unusual weakness, numbness, or feel faint.  You have severe chest or abdominal pain.  You vomit repeatedly.  You have trouble breathing. This information is not intended to replace advice given to you by your health care provider. Make sure you discuss any questions you have with your health care provider. Document Released: 10/19/2005 Document Revised: 03/26/2016 Document Reviewed: 08/11/2013 Elsevier Interactive Patient Education  2017 [employer].

## 2016-10-21 NOTE — Progress Notes (Signed)
Patient vital signs assessed and found bp of 168/104 in left arm, 173/118 in right arm.  Patient states that she took her blood pressure medication this am.  States it will come down some but will return to high after lunch (typical).  Also stated that she was originally going to cancel her appointment today because she didn't feel well enough.  She went to pulmonologist last week and they presribed 10 day anitibiotic of which she stopped taking at 5 days as it made her feel worse and weak.  She is going to follow up with them on Friday.  Still very fatigued.   Reviewed with Dr. Alvy Bimler, decided to give 0.1 Clonidine for Blood pressure, no treatment today, will pick back up on 11/04/2016.   Patient agreeable and was advised to continue to monitor blood pressure throughout the day.

## 2016-10-23 ENCOUNTER — Ambulatory Visit: Payer: Self-pay | Admitting: Internal Medicine

## 2016-10-30 DIAGNOSIS — K5900 Constipation, unspecified: Secondary | ICD-10-CM | POA: Diagnosis not present

## 2016-10-30 DIAGNOSIS — Z79891 Long term (current) use of opiate analgesic: Secondary | ICD-10-CM | POA: Diagnosis not present

## 2016-10-30 DIAGNOSIS — G894 Chronic pain syndrome: Secondary | ICD-10-CM | POA: Diagnosis not present

## 2016-10-30 DIAGNOSIS — M47812 Spondylosis without myelopathy or radiculopathy, cervical region: Secondary | ICD-10-CM | POA: Diagnosis not present

## 2016-11-04 ENCOUNTER — Ambulatory Visit (HOSPITAL_BASED_OUTPATIENT_CLINIC_OR_DEPARTMENT_OTHER): Payer: Medicare Other

## 2016-11-04 ENCOUNTER — Other Ambulatory Visit (HOSPITAL_BASED_OUTPATIENT_CLINIC_OR_DEPARTMENT_OTHER): Payer: Medicare Other

## 2016-11-04 ENCOUNTER — Ambulatory Visit: Payer: Medicare Other

## 2016-11-04 VITALS — BP 133/83 | HR 60 | Temp 97.8°F | Resp 16

## 2016-11-04 DIAGNOSIS — C9001 Multiple myeloma in remission: Secondary | ICD-10-CM

## 2016-11-04 DIAGNOSIS — Z5112 Encounter for antineoplastic immunotherapy: Secondary | ICD-10-CM | POA: Diagnosis present

## 2016-11-04 DIAGNOSIS — C9002 Multiple myeloma in relapse: Secondary | ICD-10-CM

## 2016-11-04 LAB — CBC WITH DIFFERENTIAL/PLATELET
BASO%: 1 % (ref 0.0–2.0)
Basophils Absolute: 0.1 10*3/uL (ref 0.0–0.1)
EOS%: 8.7 % — ABNORMAL HIGH (ref 0.0–7.0)
Eosinophils Absolute: 0.5 10*3/uL (ref 0.0–0.5)
HCT: 43 % (ref 34.8–46.6)
HGB: 14.2 g/dL (ref 11.6–15.9)
LYMPH%: 8 % — AB (ref 14.0–49.7)
MCH: 30.6 pg (ref 25.1–34.0)
MCHC: 33 g/dL (ref 31.5–36.0)
MCV: 92.7 fL (ref 79.5–101.0)
MONO#: 0.9 10*3/uL (ref 0.1–0.9)
MONO%: 18 % — AB (ref 0.0–14.0)
NEUT#: 3.3 10*3/uL (ref 1.5–6.5)
NEUT%: 63.7 % (ref 38.4–76.8)
PLATELETS: 112 10*3/uL — AB (ref 145–400)
RBC: 4.64 10*6/uL (ref 3.70–5.45)
RDW: 17.2 % — ABNORMAL HIGH (ref 11.2–14.5)
WBC: 5.2 10*3/uL (ref 3.9–10.3)
lymph#: 0.4 10*3/uL — ABNORMAL LOW (ref 0.9–3.3)

## 2016-11-04 LAB — COMPREHENSIVE METABOLIC PANEL
ALT: 104 U/L — AB (ref 0–55)
AST: 59 U/L — AB (ref 5–34)
Albumin: 3.5 g/dL (ref 3.5–5.0)
Alkaline Phosphatase: 87 U/L (ref 40–150)
Anion Gap: 10 mEq/L (ref 3–11)
BUN: 12.2 mg/dL (ref 7.0–26.0)
CO2: 24 mEq/L (ref 22–29)
Calcium: 9.4 mg/dL (ref 8.4–10.4)
Chloride: 105 mEq/L (ref 98–109)
Creatinine: 0.8 mg/dL (ref 0.6–1.1)
EGFR: >90 mL/min/{1.73_m2} (ref >90)
GLUCOSE: 119 mg/dL (ref 70–140)
POTASSIUM: 4 meq/L (ref 3.5–5.1)
SODIUM: 140 meq/L (ref 136–145)
Total Bilirubin: 0.59 mg/dL (ref 0.20–1.20)
Total Protein: 5.9 g/dL — ABNORMAL LOW (ref 6.4–8.3)

## 2016-11-04 LAB — TECHNOLOGIST REVIEW

## 2016-11-04 MED ORDER — CLONIDINE HCL 0.1 MG PO TABS
0.1000 mg | ORAL_TABLET | Freq: Once | ORAL | Status: AC
  Administered 2016-11-04: 0.1 mg via ORAL

## 2016-11-04 MED ORDER — HEPARIN SOD (PORK) LOCK FLUSH 100 UNIT/ML IV SOLN
500.0000 [IU] | Freq: Once | INTRAVENOUS | Status: AC | PRN
  Administered 2016-11-04: 500 [IU]
  Filled 2016-11-04: qty 5

## 2016-11-04 MED ORDER — PROCHLORPERAZINE MALEATE 10 MG PO TABS
ORAL_TABLET | ORAL | Status: AC
  Filled 2016-11-04: qty 1

## 2016-11-04 MED ORDER — SODIUM CHLORIDE 0.9 % IV SOLN
Freq: Once | INTRAVENOUS | Status: AC
  Administered 2016-11-04: 12:00:00 via INTRAVENOUS

## 2016-11-04 MED ORDER — METHYLPREDNISOLONE SODIUM SUCC 125 MG IJ SOLR
INTRAMUSCULAR | Status: AC
  Filled 2016-11-04: qty 2

## 2016-11-04 MED ORDER — PROCHLORPERAZINE MALEATE 10 MG PO TABS
10.0000 mg | ORAL_TABLET | Freq: Once | ORAL | Status: AC
  Administered 2016-11-04: 10 mg via ORAL

## 2016-11-04 MED ORDER — ZOLEDRONIC ACID 4 MG/100ML IV SOLN
4.0000 mg | Freq: Once | INTRAVENOUS | Status: AC
  Administered 2016-11-04: 4 mg via INTRAVENOUS
  Filled 2016-11-04: qty 100

## 2016-11-04 MED ORDER — SODIUM CHLORIDE 0.9 % IJ SOLN
10.0000 mL | INTRAMUSCULAR | Status: DC | PRN
  Administered 2016-11-04: 10 mL via INTRAVENOUS
  Filled 2016-11-04: qty 10

## 2016-11-04 MED ORDER — DIPHENHYDRAMINE HCL 25 MG PO CAPS
ORAL_CAPSULE | ORAL | Status: AC
  Filled 2016-11-04: qty 2

## 2016-11-04 MED ORDER — DIPHENHYDRAMINE HCL 25 MG PO CAPS
50.0000 mg | ORAL_CAPSULE | Freq: Once | ORAL | Status: AC
  Administered 2016-11-04: 50 mg via ORAL

## 2016-11-04 MED ORDER — SODIUM CHLORIDE 0.9% FLUSH
10.0000 mL | INTRAVENOUS | Status: DC | PRN
  Administered 2016-11-04: 10 mL
  Filled 2016-11-04: qty 10

## 2016-11-04 MED ORDER — CLONIDINE HCL 0.1 MG PO TABS
ORAL_TABLET | ORAL | Status: AC
  Filled 2016-11-04: qty 1

## 2016-11-04 MED ORDER — ACETAMINOPHEN 325 MG PO TABS
650.0000 mg | ORAL_TABLET | Freq: Once | ORAL | Status: AC
  Administered 2016-11-04: 650 mg via ORAL

## 2016-11-04 MED ORDER — SODIUM CHLORIDE 0.9 % IV SOLN
15.8000 mg/kg | Freq: Once | INTRAVENOUS | Status: AC
  Administered 2016-11-04: 1100 mg via INTRAVENOUS
  Filled 2016-11-04: qty 15

## 2016-11-04 MED ORDER — ACETAMINOPHEN 325 MG PO TABS
ORAL_TABLET | ORAL | Status: AC
  Filled 2016-11-04: qty 2

## 2016-11-04 MED ORDER — METHYLPREDNISOLONE SODIUM SUCC 125 MG IJ SOLR
125.0000 mg | Freq: Once | INTRAMUSCULAR | Status: AC
  Administered 2016-11-04: 125 mg via INTRAVENOUS

## 2016-11-04 NOTE — Patient Instructions (Signed)
[facility] Discharge Instructions for Patients Receiving Chemotherapy  Today you received the following chemotherapy agents: darzalex  To help prevent nausea and vomiting after your treatment, we encourage you to take your nausea medication as prescribed. If you develop nausea and vomiting that is not controlled by your nausea medication, call the clinic.   BELOW ARE SYMPTOMS THAT SHOULD BE REPORTED IMMEDIATELY:  *FEVER GREATER THAN 100.5 F  *CHILLS WITH OR WITHOUT FEVER  NAUSEA AND VOMITING THAT IS NOT CONTROLLED WITH YOUR NAUSEA MEDICATION  *UNUSUAL SHORTNESS OF BREATH  *UNUSUAL BRUISING OR BLEEDING  TENDERNESS IN MOUTH AND THROAT WITH OR WITHOUT PRESENCE OF ULCERS  *URINARY PROBLEMS  *BOWEL PROBLEMS  UNUSUAL RASH Items with * indicate a potential emergency and should be followed up as soon as possible.  Feel free to call the clinic you have any questions or concerns. The clinic phone number is [phone].  Please show the [facility] at check-in to the Emergency Department and triage nurse.

## 2016-11-04 NOTE — Progress Notes (Signed)
Patient presents feeling "weak but better" than she did last week. Coughing up clear/white moderate amount of mucous, which has also improved from last week (coughing up darker, thicker mucous last week). Patient ALT elevated at 104 and blood pressure elevated at 186/101. Per Dr. Alen Blew, [place] to treat.   Charge nurse notified of elevated BP. Will continue to monitor before treatment.   [name], BSN, RN 11/04/2016 11:41 AM

## 2016-11-04 NOTE — Progress Notes (Signed)
Patient BP rechecked at 162/114. She informed me that she did not take her BP medication. Alerted MD  Per Dr. Alen Blew, give her 0.1 mg Clonidine and proceed with treatment.   [name], BSN, RN 11/04/2016 12:00 PM

## 2016-11-05 LAB — KAPPA/LAMBDA LIGHT CHAINS
IG KAPPA FREE LIGHT CHAIN: 101.5 mg/L — AB (ref 3.3–19.4)
IG LAMBDA FREE LIGHT CHAIN: 5.6 mg/L — AB (ref 5.7–26.3)
KAPPA/LAMBDA FLC RATIO: 18.13 — AB (ref 0.26–1.65)

## 2016-11-06 LAB — MULTIPLE MYELOMA PANEL, SERUM
ALBUMIN SERPL ELPH-MCNC: 3.3 g/dL (ref 2.9–4.4)
Albumin/Glob SerPl: 1.6 (ref 0.7–1.7)
Alpha 1: 0.3 g/dL (ref 0.0–0.4)
Alpha2 Glob SerPl Elph-Mcnc: 0.7 g/dL (ref 0.4–1.0)
B-GLOBULIN SERPL ELPH-MCNC: 0.9 g/dL (ref 0.7–1.3)
Gamma Glob SerPl Elph-Mcnc: 0.4 g/dL (ref 0.4–1.8)
Globulin, Total: 2.2 g/dL (ref 2.2–3.9)
IgA, Qn, Serum: 28 mg/dL — ABNORMAL LOW (ref 87–352)
IgG, Qn, Serum: 358 mg/dL — ABNORMAL LOW (ref 700–1600)
IgM, Qn, Serum: 13 mg/dL — ABNORMAL LOW (ref 26–217)
M PROTEIN SERPL ELPH-MCNC: 0.1 g/dL — AB
Total Protein: 5.5 g/dL — ABNORMAL LOW (ref 6.0–8.5)

## 2016-11-12 DIAGNOSIS — H2513 Age-related nuclear cataract, bilateral: Secondary | ICD-10-CM | POA: Diagnosis not present

## 2016-11-13 ENCOUNTER — Telehealth: Payer: Self-pay | Admitting: *Deleted

## 2016-11-13 DIAGNOSIS — D696 Thrombocytopenia, unspecified: Secondary | ICD-10-CM

## 2016-11-13 DIAGNOSIS — C9001 Multiple myeloma in remission: Secondary | ICD-10-CM

## 2016-11-13 MED ORDER — POMALIDOMIDE 2 MG PO CAPS: 2.0000 mg | ORAL_CAPSULE | Freq: Every day | ORAL | 0 refills | Status: DC

## 2016-11-13 NOTE — Telephone Encounter (Signed)
Pt states took last Pomalyst and due to restart next Cycle on 1/17.  Refill sent to [facility].

## 2016-11-17 ENCOUNTER — Telehealth: Payer: Self-pay | Admitting: *Deleted

## 2016-11-17 NOTE — Telephone Encounter (Signed)
Pt states she has not heard from Pharmacy yet about her Pomalyst and asks if Dr. Alvy Bimler has d/c'd it?  Informed pt refill was sent last week to [facility]. Gave her the phone number to call pharmacy herself to arrange delivery.  Asked her to please call nurse back if any problems. She verbalized understanding.

## 2016-11-18 ENCOUNTER — Telehealth: Payer: Self-pay | Admitting: *Deleted

## 2016-11-18 NOTE — Telephone Encounter (Signed)
Pt states she is going to be able to get assistance through the [place]. She will not be able to start Pomalyst today as she was waiting for the assistance.   [facility] is supposed to be faxing forms to [place] at [phone].

## 2016-11-19 ENCOUNTER — Ambulatory Visit: Payer: Self-pay

## 2016-11-19 ENCOUNTER — Ambulatory Visit: Payer: Self-pay | Admitting: Hematology and Oncology

## 2016-11-19 ENCOUNTER — Other Ambulatory Visit: Payer: Self-pay

## 2016-11-19 ENCOUNTER — Telehealth: Payer: Self-pay | Admitting: *Deleted

## 2016-11-19 NOTE — Telephone Encounter (Signed)
Pls send an urgent message to reschedule everything to 1/25 I can see her at 11 am, 30 mins

## 2016-11-19 NOTE — Telephone Encounter (Signed)
Pt left VM states she cannot make her appts today and she would like to r/s.

## 2016-11-20 ENCOUNTER — Encounter: Payer: Self-pay | Admitting: Internal Medicine

## 2016-11-20 ENCOUNTER — Telehealth: Payer: Self-pay | Admitting: Hematology and Oncology

## 2016-11-20 ENCOUNTER — Ambulatory Visit (INDEPENDENT_AMBULATORY_CARE_PROVIDER_SITE_OTHER): Payer: Medicare Other | Admitting: Internal Medicine

## 2016-11-20 ENCOUNTER — Telehealth: Payer: Self-pay | Admitting: *Deleted

## 2016-11-20 VITALS — BP 156/96 | HR 89 | Ht 64.0 in | Wt 151.4 lb

## 2016-11-20 DIAGNOSIS — F17210 Nicotine dependence, cigarettes, uncomplicated: Secondary | ICD-10-CM

## 2016-11-20 DIAGNOSIS — I10 Essential (primary) hypertension: Secondary | ICD-10-CM | POA: Diagnosis not present

## 2016-11-20 DIAGNOSIS — J449 Chronic obstructive pulmonary disease, unspecified: Secondary | ICD-10-CM

## 2016-11-20 MED ORDER — FLUTTER DEVI: 0 refills | Status: AC

## 2016-11-20 MED ORDER — BUDESONIDE-FORMOTEROL FUMARATE 160-4.5 MCG/ACT IN AERO: INHALATION_SPRAY | RESPIRATORY_TRACT | 11 refills | Status: DC

## 2016-11-20 MED ORDER — BUDESONIDE-FORMOTEROL FUMARATE 160-4.5 MCG/ACT IN AERO: INHALATION_SPRAY | RESPIRATORY_TRACT | 0 refills | Status: DC

## 2016-11-20 NOTE — Progress Notes (Addendum)
Subjective:    Patient ID: Kendra Johns, female    DOB: [birth year]  MRN: [id number]     Brief patient profile:  22 yowf active smoker with MM s/p stem cell 2009 and 2013 with recurrence requiring chemo since 2013 but not working so RT to   lower ribs both sides Dec 2014  started new treatment of Jan 2015 and monthly until Jun 5th  p doe which started in April, worse  By end of May  ? Better since late May (? p d/c ACEi?)with last rx June 5th referred by Dr Sherren Mocha for eval of sob.   History of Present Illness  From chart: 10/10/2013 - 10/20/2013  Radiation Therapy  Started on palliative XRT for rib pain  11/27/2013  Carfilzomib d/c 04/06/14 last dose   05/16/2014 1st [facility] office visit/ Inda Mcglothen  Chief Complaint  Patient presents with  . Pulmonary Consult    Referred per Dr. Sherren Mocha. Pt c/o SOB for since started on chemo in March 2015.  She states that she is SOB with or without exertion.  She gets out of breath with walking approx 100 ft.   whereas at one point could not do the aisle at [facility] and now can . No cough since off ACEi  Not using saba now but seemed to help cough and congestion when she was at her worst. Not sure symbicort helped No purulent or excessive mucus rec Please remember to go to the x-ray department downstairs for your tests - we will call you with the results when they are available. The key is to stop smoking completely before smoking completely stops you!  Please schedule a follow up office visit in 6 weeks, call sooner if needed - do not restart the lisinopril in meantime as there are plenty of other choices if your blood pressure rises again - see me or Dr Sherren Mocha if needed in meantime Late add : pfts scheduled for return    09/21/2014 acute ov/Marai Teehan re: active smoker/ on ACEi/ refractory cough and wheeze  Chief Complaint  Patient presents with  . Acute Visit    Pt c/o SOB and cough for the past 10 days. Cough is prod with brown/blood tinged sputum.   rx  by ED x 7 days prior to OV  Zpak/ prednisone some better using both saba hfa and neb > 4 x daily since acute onset on symptoms 10 d prior to OV   rec I strongly recommend you stop lisinopril  Benicar 40 mg one daily x 2 weeks Augmentin 875 mg take one pill twice daily  X 10 days Mucinex dm 1200 mg every 12 hours as needed If still short or breath or cough use atrovent hfa 2 pffs every 4 hours as needed  If still coughing >  Try prilosec 20mg   Take 30-60 min before first meal of the day and Pepcid 20 mg one bedtime until cough is completely gone for at least a week without the need for cough suppression. Please schedule a follow up office visit in 2 weeks to see Tammy NP, sooner if needed and she will check your blood pressure and be sure you are better and see me for pfts>  Did not return as improved somewhat    01/15/2016  reconsult per Dr Yong Channel Melvyn Novas re: active smoker maint rx spiriva and prn atrovent due to "albuterol allergy" but note has used alb neb fine  Chief Complaint  Patient presents with  . Advice Only  Pt c/o continued cough with clear/white/brown mucus, wheeze and SOB. Pt states that she does feel she has improved some.   breathing worse since  Indolent onset 09/2015 gradually progressive to point x  across house / no early am or noct cough/congestion wheeze rec The key is to stop smoking completely before smoking completely stops you - ok to use the e cigs as a one way bridge off all tobacco products Plan A = Automatic =  Symbicort 160 Take 2 puffs first thing in am and then another 2 puffs about 12 hours later.  Work on inhaler technique:   Plan B = backup  Only use your atrovent hfa     05/29/2016  f/u ov/Ziara Thelander re:  GOLD I copd/ still smoking  On advair 2 bid / atrovent prn but but not using  Chief Complaint  Patient presents with  . Follow-up    PFT done today. Pt c/o continued cough with white/brown mucus, SOB with exertion. Pt denies wheeze/CP/tightness.   congested  cough all day Not limited by breathing but by leg pains  rec Smoke  Plan A = Automatic =  Advair Take 2 puffs first thing in am and then another 2 puffs about 12 hours later.  Plan B = Backup Only use your atovent  as a rescue medication    10/13/2016  Acute extended  ov/Maximus Hoffert re: sob and cough / still smoking on dex 4 mg daily   Chief Complaint  Patient presents with  . Acute Visit    Pt c/o increased SOB, wheezing and cough x 2 wks, worse for the past wk. Cough has been prod with dark red sputum.    cough completely resolved from prev visit on prn advair  Then got up too fast from recliner 6 weeks prior to OV  With immediate pain in R chest wall > seen by Primary   no change in rx and gradually diminished pain but then worsened again x 24 prior to OV  In same spot aggravated by coughing  Then last week in nov coughing > brownish > zpak "dried it up" until one day prior to OV  Started with red tinged mucus but no frank hemoptysis  rec Try prilosec otc 20mg   Take 30-60 min before first meal of the day and Pepcid ac (famotidine) 20 mg one @  bedtime until cough is completely gone for at least a week without the need for cough suppression GERD diet  Double the dexamethasone until better then one daily   Stop advair and take symbicort 160 Take 2 puffs first thing in am and then another 2 puffs about 12 hours later.  Only use your albuterol as a rescue medication  Please remember to go to the  x-ray department downstairs for your tests - we will call you with the results when they are available. For cough > mucinex dm 1200 mg every 12 hours  Stop cozar Start clonidine 0.1 twice daily in place of cozar  Augmentin 875 mg take one pill twice daily  X 10 days - take at breakfast and supper with large glass of water.  It would help reduce the usual side effects (diarrhea and yeast infections) if you ate cultured yogurt at lunch.  Return in 10 days with all meds in hand - if you deteriorate in  meantime go to ER      11/20/2016  f/u ov/Masao Junker re: GOLD I copd/ still smoking/ very poor MC function/ did not follow recs  re losartan Chief Complaint  Patient presents with  . Follow-up    Breathing has improved some, but not back to her normal baseline yet. She is still coughing but sputum is now clear to white.  She is using albuterol inhaler 3 x per wk on average.   sleeping poorly due to cough/ congestion/anxiety   No obvious day to day or daytime variabilty or assoc purulent sputum or mucus plugs   chest tightness,   overt sinus or hb symptoms. No unusual exp hx or h/o childhood pna/ asthma or knowledge of premature birth.  Also denies any obvious fluctuation of symptoms with weather or environmental changes or other aggravating or alleviating factors except as outlined above   Current Medications, Allergies, Complete Past Medical History, Past Surgical History, Family History, and Social History were reviewed in [employer] record.  ROS  The following are not active complaints unless bolded sore throat, dysphagia, dental problems, itching, sneezing,  nasal congestion or excess/ purulent secretions, ear ache,   fever, chills, sweats, unintended wt loss, pleuritic or exertional cp, hemoptysis,  orthopnea pnd or leg swelling, presyncope, palpitations, heartburn, abdominal pain, anorexia, nausea, vomiting, diarrhea  or change in bowel or urinary habits, change in stools or urine, dysuria,hematuria,  rash, arthralgias, visual complaints, headache, numbness weakness or ataxia or problems with walking or coordination,  change in mood/affect or memory.              Objective:   Physical Exam  amb wf nad/ rattling smoker's cough    11/20/2016         151  10/13/2016       145 05/29/2016        140  01/15/2016        164   09/21/14 123 lb (55.792 kg)  09/14/14 116 lb (52.617 kg)  08/30/14 119 lb 9.6 oz (54.25 kg)    Vital signs reviewed - Note on arrival 02  sats  99% on RA and BP 156/96       HEENT: nl  turbinates, and orophanx. Nl external ear canals without cough reflex - edentulous   NECK :  without JVD/Nodes/TM/ nl carotid upstrokes bilaterally   LUNGS: no acc muscle use,    Junky mid exp rhonchi equal  bilaterally   CV:  RRR  no s3 or murmur or increase in P2, no edema   ABD:  soft and nontender with nl excursion in the supine position. No bruits or organomegaly, bowel sounds nl  MS:  warm without deformities, calf tenderness, cyanosis or clubbing  SKIN: warm and dry without lesions    NEURO:  alert, approp, no deficits       CXR PA and Lateral:   10/13/2016 :    I personally reviewed images and agree with radiology impression as follows:    The lungs are mildly hyperinflated. The interstitial markings are coarse but stable. The heart and pulmonary vascularity are normal. There is calcification in the wall of the aortic arch. The power port catheter tip projects over the midportion of the SVC. There is no pleural effusion. There are multiple old right posterior rib fractures. There is subtle contour deformity of the lateral aspect of the left 6 rib which is chronic as well. There is old partial compression of the bodies of T5 and T6.       Assessment & Plan:

## 2016-11-20 NOTE — Telephone Encounter (Signed)
Faxed completed/ signed form to [place] assistance Program

## 2016-11-20 NOTE — Telephone Encounter (Signed)
lvm to inform pt of r/s appts date/times per LOS

## 2016-11-20 NOTE — Patient Instructions (Addendum)
Stop cozar(losartan)  and start clonidine  Twice daily and it will help your blood pressure and your pain control   For cough > mucinex dm 1200 mg every 12 hours as needed and add flutter valve as needed   Work on inhaler technique:  relax and gently blow all the way out then take a nice smooth deep breath back in, triggering the inhaler at same time you start breathing in.  Hold for up to 5 seconds if you can. Blow out thru nose. Rinse and gargle with water when done     The key is to stop smoking completely before smoking completely stops you!    See Tammy NP w/in 4 weeks with all your medications, even over the counter meds, separated in two separate bags, the ones you take no matter what vs the ones you stop once you feel better and take only as needed when you feel you need them.   Tammy  will generate for you a new user friendly medication calendar that will put [place] all on the same page re: your medication use.     Without this process, it simply isn't possible to assure that we are providing  your outpatient care  with  the attention to detail we feel you deserve.   If we cannot assure that you're getting that kind of care,  then we cannot manage your problem effectively from this clinic.  Once you have seen Tammy and we are sure that we're all on the same page with your medication use she will arrange follow up with me.

## 2016-11-21 NOTE — Assessment & Plan Note (Signed)
-   01/15/2016  extensive coaching HFA effectiveness =    75% > try symbicort 160 2bid > return for pfts  - PFT's  05/29/2016  FEV1 1.62 (91 % ) ratio 64  p no % improvement from saba p nothing prior to study with DLCO  47/50 % corrects to 94 % for alv volume  - 10/13/2016  After extensive coaching HFA effectiveness =    75% > changed from advair dpi to symbicort 160 2bid   - 11/20/2016  After extensive coaching HFA effectiveness =    50%   Symptoms of cough > airflow obstruction remain refractory at this point and probably largely related to poor hfa/ ongoing smoking at this point   See cig (see separate a/p)   hfa training as above  Flutter valve training also today    Pt on very complex regimen and clear to me today she needs help organizing her meds.   To keep things simple, I have asked the patient to first separate medicines that are perceived as maintenance, that is to be taken daily "no matter what", from those medicines that are taken on only on an as-needed basis and I have given the patient examples of both, and then return to see our NP to generate a  detailed  medication calendar which should be followed until the next physician sees the patient and updates it.     I had an extended discussion with the patient/daughter reviewing all relevant studies completed to date and  lasting 15 to 20 minutes of a 25 minute visit    Each maintenance medication was reviewed in detail including most importantly the difference between maintenance and prns and under what circumstances the prns are to be triggered using an action plan format that is not reflected in the computer generated alphabetically organized AVS.    Please see AVS for specific instructions unique to this visit that I personally wrote and verbalized to the the pt in detail and then reviewed with pt  by my nurse highlighting any  changes in therapy recommended at today's visit to her  plan of care.

## 2016-11-21 NOTE — Assessment & Plan Note (Signed)
>   3 m  Advised that her smoking is now a matter of life  Or breath. I took an extended  opportunity with this patient to outline the consequences of continued cigarette use  in airway disorders based on all the data we have from the multiple national lung health studies (perfomed over decades at millions of dollars in cost)  indicating that smoking cessation, not choice of inhalers or physicians, is the most important aspect of her care.  Was not willing though to commit to quit at this point. Follow up per Primary Care planned

## 2016-11-21 NOTE — Assessment & Plan Note (Addendum)
Pt d/c acei ? May 2015 on her own > cough and breathing improved by time of pulmonary eval 05/17/2014  - rec she stop acei indefinitely @ pulmonary ov 09/21/14  - changed arb to clonidine 11/20/2016   Did not follow prev instruction and note bp poor control on arb Since cough is her greatest concern  Again rec trial off losartan For reasons that may related to vascular permability and nitric oxide pathways but not elevated  bradykinin levels (as seen with  ACEi use) losartan in the generic form has been reported now from mulitple sources  to cause a similar pattern of non-specific  upper airway symptoms as seen with acei.   This has not been reported with exposure to the other ARB's to date, so it seems reasonable for now to try either generic diovan or avapro if ARB needed or use an alternative class altogether.  See:  Lelon Frohlich Allergy Asthma Immunol  2008: 101: p [phone]    Clonidine better  Choice anyway in a chronic narcotic dep pt

## 2016-11-23 ENCOUNTER — Telehealth: Payer: Self-pay

## 2016-11-23 NOTE — Telephone Encounter (Signed)
Patient called in inquire about her scheduled appts. States she had to cancel last week d/t inclement weather. New schedule in place. Patient aware of dates/times for this week. Verbalized understanding. Also, patient inquiring about payment assistance for Pomalyst. Informed patient forms had been submitted, and we would f/u with our pharmacy to determine next steps.

## 2016-11-26 ENCOUNTER — Ambulatory Visit: Payer: Medicare Other

## 2016-11-26 ENCOUNTER — Telehealth: Payer: Self-pay | Admitting: Hematology and Oncology

## 2016-11-26 ENCOUNTER — Ambulatory Visit (HOSPITAL_BASED_OUTPATIENT_CLINIC_OR_DEPARTMENT_OTHER): Payer: Medicare Other

## 2016-11-26 ENCOUNTER — Other Ambulatory Visit (HOSPITAL_BASED_OUTPATIENT_CLINIC_OR_DEPARTMENT_OTHER): Payer: Medicare Other

## 2016-11-26 ENCOUNTER — Ambulatory Visit (HOSPITAL_BASED_OUTPATIENT_CLINIC_OR_DEPARTMENT_OTHER): Payer: Medicare Other | Admitting: Hematology and Oncology

## 2016-11-26 ENCOUNTER — Other Ambulatory Visit: Payer: Self-pay | Admitting: Hematology and Oncology

## 2016-11-26 ENCOUNTER — Encounter: Payer: Self-pay | Admitting: Hematology and Oncology

## 2016-11-26 VITALS — BP 162/90 | HR 66 | Temp 98.0°F | Resp 18 | Ht 64.0 in | Wt 152.9 lb

## 2016-11-26 VITALS — BP 152/88 | HR 58 | Temp 97.7°F | Resp 18

## 2016-11-26 DIAGNOSIS — I10 Essential (primary) hypertension: Secondary | ICD-10-CM | POA: Diagnosis not present

## 2016-11-26 DIAGNOSIS — C9001 Multiple myeloma in remission: Secondary | ICD-10-CM

## 2016-11-26 DIAGNOSIS — B182 Chronic viral hepatitis C: Secondary | ICD-10-CM

## 2016-11-26 DIAGNOSIS — Z720 Tobacco use: Secondary | ICD-10-CM | POA: Diagnosis not present

## 2016-11-26 DIAGNOSIS — Z5112 Encounter for antineoplastic immunotherapy: Secondary | ICD-10-CM

## 2016-11-26 DIAGNOSIS — R05 Cough: Secondary | ICD-10-CM

## 2016-11-26 DIAGNOSIS — R748 Abnormal levels of other serum enzymes: Secondary | ICD-10-CM

## 2016-11-26 DIAGNOSIS — G893 Neoplasm related pain (acute) (chronic): Secondary | ICD-10-CM | POA: Diagnosis not present

## 2016-11-26 DIAGNOSIS — C9002 Multiple myeloma in relapse: Secondary | ICD-10-CM

## 2016-11-26 DIAGNOSIS — F17210 Nicotine dependence, cigarettes, uncomplicated: Secondary | ICD-10-CM

## 2016-11-26 LAB — COMPREHENSIVE METABOLIC PANEL
ALT: 111 U/L — AB (ref 0–55)
ANION GAP: 7 meq/L (ref 3–11)
AST: 67 U/L — ABNORMAL HIGH (ref 5–34)
Albumin: 3.2 g/dL — ABNORMAL LOW (ref 3.5–5.0)
Alkaline Phosphatase: 94 U/L (ref 40–150)
BUN: 18 mg/dL (ref 7.0–26.0)
CALCIUM: 8.7 mg/dL (ref 8.4–10.4)
CO2: 24 meq/L (ref 22–29)
CREATININE: 0.8 mg/dL (ref 0.6–1.1)
Chloride: 107 mEq/L (ref 98–109)
Glucose: 117 mg/dl (ref 70–140)
Potassium: 4.1 mEq/L (ref 3.5–5.1)
Sodium: 138 mEq/L (ref 136–145)
Total Bilirubin: 0.6 mg/dL (ref 0.20–1.20)
Total Protein: 5.5 g/dL — ABNORMAL LOW (ref 6.4–8.3)

## 2016-11-26 LAB — CBC WITH DIFFERENTIAL/PLATELET
BASO%: 0.3 % (ref 0.0–2.0)
BASOS ABS: 0 10*3/uL (ref 0.0–0.1)
EOS%: 0.9 % (ref 0.0–7.0)
Eosinophils Absolute: 0.1 10*3/uL (ref 0.0–0.5)
HEMATOCRIT: 42.3 % (ref 34.8–46.6)
HGB: 14.2 g/dL (ref 11.6–15.9)
LYMPH%: 7.5 % — AB (ref 14.0–49.7)
MCH: 31.3 pg (ref 25.1–34.0)
MCHC: 33.6 g/dL (ref 31.5–36.0)
MCV: 93.2 fL (ref 79.5–101.0)
MONO#: 0.5 10*3/uL (ref 0.1–0.9)
MONO%: 8 % (ref 0.0–14.0)
NEUT#: 4.9 10*3/uL (ref 1.5–6.5)
NEUT%: 83.3 % — AB (ref 38.4–76.8)
PLATELETS: 163 10*3/uL (ref 145–400)
RBC: 4.54 10*6/uL (ref 3.70–5.45)
RDW: 18.6 % — ABNORMAL HIGH (ref 11.2–14.5)
WBC: 5.9 10*3/uL (ref 3.9–10.3)
lymph#: 0.4 10*3/uL — ABNORMAL LOW (ref 0.9–3.3)
nRBC: 0 % (ref 0–0)

## 2016-11-26 MED ORDER — ACETAMINOPHEN 325 MG PO TABS
650.0000 mg | ORAL_TABLET | Freq: Once | ORAL | Status: AC
  Administered 2016-11-26: 650 mg via ORAL

## 2016-11-26 MED ORDER — HEPARIN SOD (PORK) LOCK FLUSH 100 UNIT/ML IV SOLN
500.0000 [IU] | Freq: Once | INTRAVENOUS | Status: AC | PRN
  Administered 2016-11-26: 500 [IU]
  Filled 2016-11-26: qty 5

## 2016-11-26 MED ORDER — SODIUM CHLORIDE 0.9 % IV SOLN
15.8000 mg/kg | Freq: Once | INTRAVENOUS | Status: AC
  Administered 2016-11-26: 1100 mg via INTRAVENOUS
  Filled 2016-11-26: qty 40

## 2016-11-26 MED ORDER — SODIUM CHLORIDE 0.9 % IV SOLN
Freq: Once | INTRAVENOUS | Status: AC
  Administered 2016-11-26: 12:00:00 via INTRAVENOUS

## 2016-11-26 MED ORDER — ACETAMINOPHEN 325 MG PO TABS
ORAL_TABLET | ORAL | Status: AC
  Filled 2016-11-26: qty 2

## 2016-11-26 MED ORDER — PROCHLORPERAZINE MALEATE 10 MG PO TABS
10.0000 mg | ORAL_TABLET | Freq: Once | ORAL | Status: AC
  Administered 2016-11-26: 10 mg via ORAL

## 2016-11-26 MED ORDER — DIPHENHYDRAMINE HCL 25 MG PO CAPS
50.0000 mg | ORAL_CAPSULE | Freq: Once | ORAL | Status: AC
  Administered 2016-11-26: 50 mg via ORAL

## 2016-11-26 MED ORDER — SODIUM CHLORIDE 0.9% FLUSH
10.0000 mL | INTRAVENOUS | Status: DC | PRN
  Administered 2016-11-26: 10 mL
  Filled 2016-11-26: qty 10

## 2016-11-26 MED ORDER — LIDOCAINE-PRILOCAINE 2.5-2.5 % EX CREA: 1.0000 "application " | TOPICAL_CREAM | CUTANEOUS | 6 refills | Status: AC | PRN

## 2016-11-26 MED ORDER — DIPHENHYDRAMINE HCL 25 MG PO CAPS
ORAL_CAPSULE | ORAL | Status: AC
  Filled 2016-11-26: qty 2

## 2016-11-26 MED ORDER — PROCHLORPERAZINE MALEATE 10 MG PO TABS
ORAL_TABLET | ORAL | Status: AC
  Filled 2016-11-26: qty 1

## 2016-11-26 MED ORDER — METHYLPREDNISOLONE SODIUM SUCC 125 MG IJ SOLR
125.0000 mg | Freq: Once | INTRAMUSCULAR | Status: AC
  Administered 2016-11-26: 125 mg via INTRAVENOUS

## 2016-11-26 MED ORDER — METHYLPREDNISOLONE SODIUM SUCC 125 MG IJ SOLR
INTRAMUSCULAR | Status: AC
  Filled 2016-11-26: qty 2

## 2016-11-26 NOTE — Assessment & Plan Note (Signed)
The patient had likely have cancer associated pain. In addition to the dexamethasone, she will continue on her chronic pain medicine as prescribed.

## 2016-11-26 NOTE — Assessment & Plan Note (Signed)
Her recent myeloma showed stable disease The additional dexamethasone was helpful I will proceed with Daratumumab now She will continue calcium with vitamin D supplement. She has received recent Zometa, next due in April 2018. We will try to get Pomalyst approval so that she can resume her treatment

## 2016-11-26 NOTE — Telephone Encounter (Signed)
Appointment scheduled per 11/26/16 los. Patient was given a copy of the AVS report and appointment schedule per 11/26/16 los. °

## 2016-11-26 NOTE — Progress Notes (Signed)
[facility] OFFICE PROGRESS NOTE  Patient Care Team: Marin Olp, MD as PCP - General (Family Medicine) Jeanann Lewandowsky, MD as Consulting Physician (Medical Oncology) Marin Olp, MD as Consulting Physician (Family Medicine) Heath Lark, MD as Consulting Physician (Hematology and Oncology) [name], MD as Consulting Physician (Orthopedic Surgery)  SUMMARY OF ONCOLOGIC HISTORY: Oncology History   Multiple myeloma, kappa light chain disease, Durie-Salmon stage III       Multiple myeloma in remission ([place])   07/16/2006 Bone Marrow Biopsy    BM biopsy is non-diagnostic      09/21/2006 Procedure    L5 vertebral biopsy 5% plasma cell      10/25/2006 Bone Marrow Biopsy    BM biopsy is hypercellular with 5% plasma cell      11/17/2006 Procedure    L5 biopsy confirmed plasmacytoma      01/03/2007 - 01/10/2007 Radiation Therapy    Approximate date only, received RT for plasmacytoma followed by surgery      09/14/2007 Initial Diagnosis    MULTIPLE  MYELOMA      10/05/2007 Bone Marrow Biopsy    Bm biopsy was negative      06/18/2008 Bone Marrow Biopsy    BM biopsy was negative      07/26/2008 Bone Marrow Transplant    Stem cell transplant at [facility]      04/13/2011 Relapse/Recurrence    Disease relapse      04/14/2011 Bone Marrow Biopsy    Bm biopsy showed 2 % plasma cell      04/27/2011 Relapse/Recurrence    Disease relapse, treated with Velcade/Cytoxan/Dex      09/07/2011 - 07/28/2013 Chemotherapy    She has been receiving Velcade      12/27/2011 Bone Marrow Transplant    2nd transplant at [facility]      07/28/2013 Relapse/Recurrence    Chemo is stopped due to progression of disease      08/29/2013 Imaging    PEt/CT showed recurrence of disease with new lesion on her rib with compression fracture      09/13/2013 Bone Marrow Biopsy    BM biopsy is hypercellular with 6% plasma cell      10/10/2013 - 10/20/2013 Radiation Therapy     Started on palliative XRT for rib pain      11/27/2013 - 04/06/2014 Chemotherapy    The patient starts chemotherapy with Carfilzomib      07/19/2014 Imaging    Repeat bloodwork and PET/CT scan shows significant disease progression.      08/02/2014 - 03/06/2015 Chemotherapy    She enrolled in clinical trial using combination therapy with Revlimid, dexamethasone and Elotuzumab      03/07/2015 - 06/04/2015 Chemotherapy    She is on maintenance Revlimid only without dexamethasone      05/27/2015 Imaging     MRI spine showed new compression fracture.      06/04/2015 - 06/18/2015 Radiation Therapy     she received palliative radiation therapy to 25 Gy C7-T1      07/10/2015 - 08/07/2015 Chemotherapy    She received Elotuzumab, dex and Revlimid. Rx is stopped due to progression      08/01/2015 Imaging    MRI of the spine was performed today due to new onset of worsening right hip pain/flank area. MRI show mild progression of the L1 vertebral body.      08/21/2015 - 08/26/2015 Hospital Admission    She was admitted to the hospital due to pathologic  fracture to right femur      08/22/2015 Surgery    She had IM nail to fractured femur      08/23/2015 - 09/20/2015 Radiation Therapy    She received XRT to her L1 L2 and Right Femur      09/05/2015 - 09/06/2015 Hospital Admission    She had recurrent admission due to displaced fracture, managed conservatively      09/12/2015 - 09/17/2015 Hospital Admission    She had recurrent admission for COPD exacerbation      09/30/2015 -  Chemotherapy    She was started on Pomalyst      12/10/2015 -  Chemotherapy    She started weekly Daratumumab along with Pomalyst       INTERVAL HISTORY: Please see below for problem oriented charting. She returns for follow-up. She is concerned about difficulties getting her chemotherapy approval. In the meantime, she continues to struggle with significant musculoskeletal pain. She continues on chronic pain  medicine management. Daily dexamethasone appears to help with symptom She denies recent fall. She continues to smoke. She continues to have significant cough but denies fever or chills.  REVIEW OF SYSTEMS:   Constitutional: Denies fevers, chills or abnormal weight loss Eyes: Denies blurriness of vision Ears, nose, mouth, throat, and face: Denies mucositis or sore throat Cardiovascular: Denies palpitation, chest discomfort or lower extremity swelling Gastrointestinal:  Denies nausea, heartburn or change in bowel habits Skin: Denies abnormal skin rashes Lymphatics: Denies new lymphadenopathy or easy bruising Neurological:Denies numbness, tingling or new weaknesses Behavioral/Psych: Mood is stable, no new changes  All other systems were reviewed with the patient and are negative.  I have reviewed the past medical history, past surgical history, social history and family history with the patient and they are unchanged from previous note.  ALLERGIES:  is allergic to codeine and ibuprofen.  MEDICATIONS:  Current Outpatient Prescriptions  Medication Sig Dispense Refill  . acyclovir (ZOVIRAX) 400 MG tablet TAKE 1 TABLET BY MOUTH EVERY DAY 30 tablet 6  . albuterol (PROAIR HFA) 108 (90 Base) MCG/ACT inhaler 2 puffs every 4 hours as needed only  if your can't catch your breath 1 Inhaler 0  . ALPRAZolam (XANAX) 0.25 MG tablet Take 1 tablet (0.25 mg total) by mouth at bedtime as needed for anxiety. 60 tablet 0  . aspirin 81 MG tablet Take 81 mg by mouth daily.    . budesonide-formoterol (SYMBICORT) 160-4.5 MCG/ACT inhaler Take 2 puffs first thing in am and then another 2 puffs about 12 hours later. 1 Inhaler 0  . cloNIDine (CATAPRES) 0.1 MG tablet Take 1 tablet (0.1 mg total) by mouth 2 (two) times daily. 60 tablet 11  . cyclobenzaprine (FLEXERIL) 10 MG tablet Take 1 tablet (10 mg total) by mouth 3 (three) times daily as needed for muscle spasms. 15 tablet 0  . dexamethasone (DECADRON) 4 MG tablet  Take 1 tablet (4 mg total) by mouth daily. 60 tablet 1  . gabapentin (NEURONTIN) 600 MG tablet Take 1 tablet (600 mg total) by mouth 3 (three) times daily. 90 tablet 3  . levothyroxine (SYNTHROID, LEVOTHROID) 50 MCG tablet TAKE 1 TABLET BY MOUTH EVERY DAY BEFORE BREAKFAST 90 tablet 1  . lidocaine-prilocaine (EMLA) cream Apply 1 application topically as needed. 30 g 6  . losartan (COZAAR) 50 MG tablet Take 1 tablet by mouth daily.  1  . morphine (MS CONTIN) 100 MG 12 hr tablet Take 100 mg by mouth every 8 (eight) hours.     Marland Kitchen  Oxycodone HCl 20 MG TABS     . pomalidomide (POMALYST) 2 MG capsule Take 1 capsule (2 mg total) by mouth daily. Take with water on days 1-21. Repeat every 28 days. 21 capsule 0  . prochlorperazine (COMPAZINE) 10 MG tablet Take 1 tablet (10 mg total) by mouth every 6 (six) hours as needed. for nausea 90 tablet 3  . Respiratory Therapy Supplies (FLUTTER) DEVI Use as indicated 1 each 0  . Vitamin D, Ergocalciferol, (DRISDOL) 50000 units CAPS capsule Take 1 capsule (50,000 Units total) by mouth every 7 (seven) days. 12 capsule 3   No current facility-administered medications for this visit.    Facility-Administered Medications Ordered in Other Visits  Medication Dose Route Frequency Provider Last Rate Last Dose  . heparin lock flush 100 unit/mL  500 Units Intracatheter Once PRN Heath Lark, MD      . sodium chloride 0.9 % injection 10 mL  10 mL Intravenous PRN Heath Lark, MD   10 mL at 02/19/16 0911  . sodium chloride flush (NS) 0.9 % injection 10 mL  10 mL Intracatheter PRN Heath Lark, MD        PHYSICAL EXAMINATION: ECOG PERFORMANCE STATUS: 2 - Symptomatic, <50% confined to bed  Vitals:   11/26/16 1057  BP: (!) 162/90  Pulse: 66  Resp: 18  Temp: 98 F (36.7 C)   Filed Weights   11/26/16 1057  Weight: 152 lb 14.4 oz (69.4 kg)    GENERAL:alert, no distress and comfortable. She looks mildly cushingoid SKIN: skin color, texture, turgor are normal, no rashes or  significant lesions EYES: normal, Conjunctiva are pink and non-injected, sclera clear OROPHARYNX:no exudate, no erythema and lips, buccal mucosa, and tongue normal  NECK: supple, thyroid normal size, non-tender, without nodularity LYMPH:  no palpable lymphadenopathy in the cervical, axillary or inguinal LUNGS: clear to auscultation and percussion with normal breathing effort HEART: regular rate & rhythm and no murmurs and no lower extremity edema ABDOMEN:abdomen soft, non-tender and normal bowel sounds Musculoskeletal:no cyanosis of digits and no clubbing  NEURO: alert & oriented x 3 with fluent speech, no focal motor/sensory deficits  LABORATORY DATA:  I have reviewed the data as listed    Component Value Date/Time   NA 138 11/26/2016 1027   K 4.1 11/26/2016 1027   CL 107 09/11/2015 2255   CL 107 03/10/2013 1014   CO2 24 11/26/2016 1027   GLUCOSE 117 11/26/2016 1027   GLUCOSE 111 (H) 03/10/2013 1014   BUN 18.0 11/26/2016 1027   CREATININE 0.8 11/26/2016 1027   CALCIUM 8.7 11/26/2016 1027   PROT 5.5 (L) 11/26/2016 1027   ALBUMIN 3.2 (L) 11/26/2016 1027   AST 67 (H) 11/26/2016 1027   ALT 111 (H) 11/26/2016 1027   ALKPHOS 94 11/26/2016 1027   BILITOT 0.60 11/26/2016 1027   GFRNONAA >60 09/12/2015 0620   GFRAA >60 09/12/2015 0620    No results found for: SPEP, UPEP  Lab Results  Component Value Date   WBC 5.9 11/26/2016   NEUTROABS 4.9 11/26/2016   HGB 14.2 11/26/2016   HCT 42.3 11/26/2016   MCV 93.2 11/26/2016   PLT 163 11/26/2016      Chemistry      Component Value Date/Time   NA 138 11/26/2016 1027   K 4.1 11/26/2016 1027   CL 107 09/11/2015 2255   CL 107 03/10/2013 1014   CO2 24 11/26/2016 1027   BUN 18.0 11/26/2016 1027   CREATININE 0.8 11/26/2016 1027  Component Value Date/Time   CALCIUM 8.7 11/26/2016 1027   ALKPHOS 94 11/26/2016 1027   AST 67 (H) 11/26/2016 1027   ALT 111 (H) 11/26/2016 1027   BILITOT 0.60 11/26/2016 1027       ASSESSMENT &  PLAN:  Multiple myeloma in remission ([place]) Her recent myeloma showed stable disease The additional dexamethasone was helpful I will proceed with Daratumumab now She will continue calcium with vitamin D supplement. She has received recent Zometa, next due in April 2018. We will try to get Pomalyst approval so that she can resume her treatment  Elevated liver enzymes She has elevated liver enzymes of unknown etiology. I will order ultrasound for evaluation. I will proceed with treatment today. I will recheck hepatitis C status to make sure this is not due to reactivation of hepatitis C  Cigarette smoker She had recurrent admission to the hospital last year Again, I spent some time explaining to her the importance of nicotine cessation.  Cancer associated pain The patient had likely have cancer associated pain. In addition to the dexamethasone, she will continue on her chronic pain medicine as prescribed.  Essential hypertension she will continue current medical management. I recommend close follow-up with primary care doctor for medication adjustment.    Orders Placed This Encounter  Procedures  . Comprehensive metabolic panel    Standing Status:   Future    Standing Expiration Date:   12/31/2017  . CBC with Differential/Platelet    Standing Status:   Future    Standing Expiration Date:   12/31/2017  . Kappa/lambda light chains    Standing Status:   Future    Standing Expiration Date:   12/31/2017  . Multiple Myeloma Panel (SPEP&IFE w/QIG)    Standing Status:   Future    Standing Expiration Date:   12/31/2017   All questions were answered. The patient knows to call the clinic with any problems, questions or concerns. No barriers to learning was detected. I spent 30 minutes counseling the patient face to face. The total time spent in the appointment was 40 minutes and more than 50% was on counseling and review of test results     Heath Lark, MD 11/26/2016 5:20 PM

## 2016-11-26 NOTE — Assessment & Plan Note (Signed)
she will continue current medical management. I recommend close follow-up with primary care doctor for medication adjustment.  

## 2016-11-26 NOTE — Progress Notes (Signed)
Per Tammi, RN per Dr. Alvy Bimler, okay to treat with ALT of 111 and AST of 67.

## 2016-11-26 NOTE — Patient Instructions (Signed)
[facility] Discharge Instructions for Patients  Today you received the following: Daratumumab  To help prevent nausea and vomiting after your treatment, we encourage you to take your nausea medication as prescribed.   If you develop nausea and vomiting that is not controlled by your nausea medication, call the clinic.   BELOW ARE SYMPTOMS THAT SHOULD BE REPORTED IMMEDIATELY:  *FEVER GREATER THAN 100.5 F  *CHILLS WITH OR WITHOUT FEVER  NAUSEA AND VOMITING THAT IS NOT CONTROLLED WITH YOUR NAUSEA MEDICATION  *UNUSUAL SHORTNESS OF BREATH  *UNUSUAL BRUISING OR BLEEDING  TENDERNESS IN MOUTH AND THROAT WITH OR WITHOUT PRESENCE OF ULCERS  *URINARY PROBLEMS  *BOWEL PROBLEMS  UNUSUAL RASH Items with * indicate a potential emergency and should be followed up as soon as possible.  Feel free to call the clinic you have any questions or concerns. The clinic phone number is [phone].  Please show the [facility] at check-in to the Emergency Department and triage nurse.

## 2016-11-26 NOTE — Assessment & Plan Note (Signed)
She had recurrent admission to the hospital last year Again, I spent some time explaining to her the importance of nicotine cessation.

## 2016-11-26 NOTE — Telephone Encounter (Signed)
Message sent to chemo scheduler to be added, per 11/26/16 los.

## 2016-11-26 NOTE — Assessment & Plan Note (Signed)
She has elevated liver enzymes of unknown etiology. I will order ultrasound for evaluation. I will proceed with treatment today. I will recheck hepatitis C status to make sure this is not due to reactivation of hepatitis C

## 2016-11-27 ENCOUNTER — Telehealth: Payer: Self-pay | Admitting: *Deleted

## 2016-11-27 DIAGNOSIS — Z79891 Long term (current) use of opiate analgesic: Secondary | ICD-10-CM | POA: Diagnosis not present

## 2016-11-27 DIAGNOSIS — G894 Chronic pain syndrome: Secondary | ICD-10-CM | POA: Diagnosis not present

## 2016-11-27 DIAGNOSIS — M47812 Spondylosis without myelopathy or radiculopathy, cervical region: Secondary | ICD-10-CM | POA: Diagnosis not present

## 2016-11-27 DIAGNOSIS — K5900 Constipation, unspecified: Secondary | ICD-10-CM | POA: Diagnosis not present

## 2016-11-27 NOTE — Telephone Encounter (Signed)
Per 1/25 LOS and staff message I have scheduled appt and notified the scheduler

## 2016-11-29 ENCOUNTER — Other Ambulatory Visit: Payer: Self-pay | Admitting: Family Medicine

## 2016-11-29 ENCOUNTER — Other Ambulatory Visit: Payer: Self-pay | Admitting: Hematology and Oncology

## 2016-11-30 ENCOUNTER — Encounter: Payer: Self-pay | Admitting: Hematology and Oncology

## 2016-11-30 NOTE — Progress Notes (Signed)
Auth requested for Lidocaine-Prilocaine today.

## 2016-11-30 NOTE — Telephone Encounter (Signed)
im ok with refill. Would you mind calling her and state that we would love to see her in the next 6 months since we are prescribing some of her long term medicines though? Or could send mychart message it appears

## 2016-12-01 ENCOUNTER — Telehealth: Payer: Self-pay | Admitting: *Deleted

## 2016-12-01 ENCOUNTER — Encounter: Payer: Self-pay | Admitting: Hematology and Oncology

## 2016-12-01 NOTE — Telephone Encounter (Signed)
Medicare denied lidocaine  (EMLA ) .  Pt states she pays for it out of pocket. Is OK with denial

## 2016-12-01 NOTE — Progress Notes (Signed)
Pt's Lidocaine-Prilocaine was denied because it's not FDA approved or listed as an off-label use in one of the approved Medicare D drug references for treating Multiple Myeloma in remission.  Gave denial to the nurse.

## 2016-12-03 ENCOUNTER — Ambulatory Visit (HOSPITAL_COMMUNITY)
Admission: RE | Admit: 2016-12-03 | Discharge: 2016-12-03 | Disposition: A | Discharge status: Home / Self Care | Payer: Medicare Other | Source: Ambulatory Visit | Attending: Hematology and Oncology | Admitting: Hematology and Oncology

## 2016-12-03 DIAGNOSIS — B182 Chronic viral hepatitis C: Secondary | ICD-10-CM | POA: Diagnosis not present

## 2016-12-03 DIAGNOSIS — R945 Abnormal results of liver function studies: Secondary | ICD-10-CM | POA: Diagnosis not present

## 2016-12-04 ENCOUNTER — Other Ambulatory Visit: Payer: Self-pay

## 2016-12-04 ENCOUNTER — Telehealth: Payer: Self-pay

## 2016-12-04 NOTE — Telephone Encounter (Signed)
Spoke with patient and scheduled her for 12/31/16 @ 10:45.

## 2016-12-04 NOTE — Telephone Encounter (Signed)
I instructed pt to contact [place] to check status of her co-pay assistance.  According to our Oral Chemo Navigator, it has been awarded.  I gave pt the phone number to contact pharmacy.

## 2016-12-04 NOTE — Telephone Encounter (Signed)
Patient left message stating she had not heard anything about chemo pills, called and left message with Johny Drilling in pharmacy who works with oral chemotherapy.

## 2016-12-07 ENCOUNTER — Other Ambulatory Visit: Payer: Self-pay

## 2016-12-07 DIAGNOSIS — C9001 Multiple myeloma in remission: Secondary | ICD-10-CM

## 2016-12-07 DIAGNOSIS — D696 Thrombocytopenia, unspecified: Secondary | ICD-10-CM

## 2016-12-07 MED ORDER — POMALIDOMIDE 2 MG PO CAPS: 2.0000 mg | ORAL_CAPSULE | Freq: Every day | ORAL | 0 refills | Status: DC

## 2016-12-08 IMAGING — DX DG FEMUR 2+V*R*
3 series · 3 of 3 positions shown · non-contrast
Comparison: 08/21/2015 and 08/22/2015

CLINICAL DATA: Right hip pain. Multiple myeloma. Right hip pinning
08/22/2015

EXAM:
RIGHT FEMUR 2 VIEWS

[femur lat]
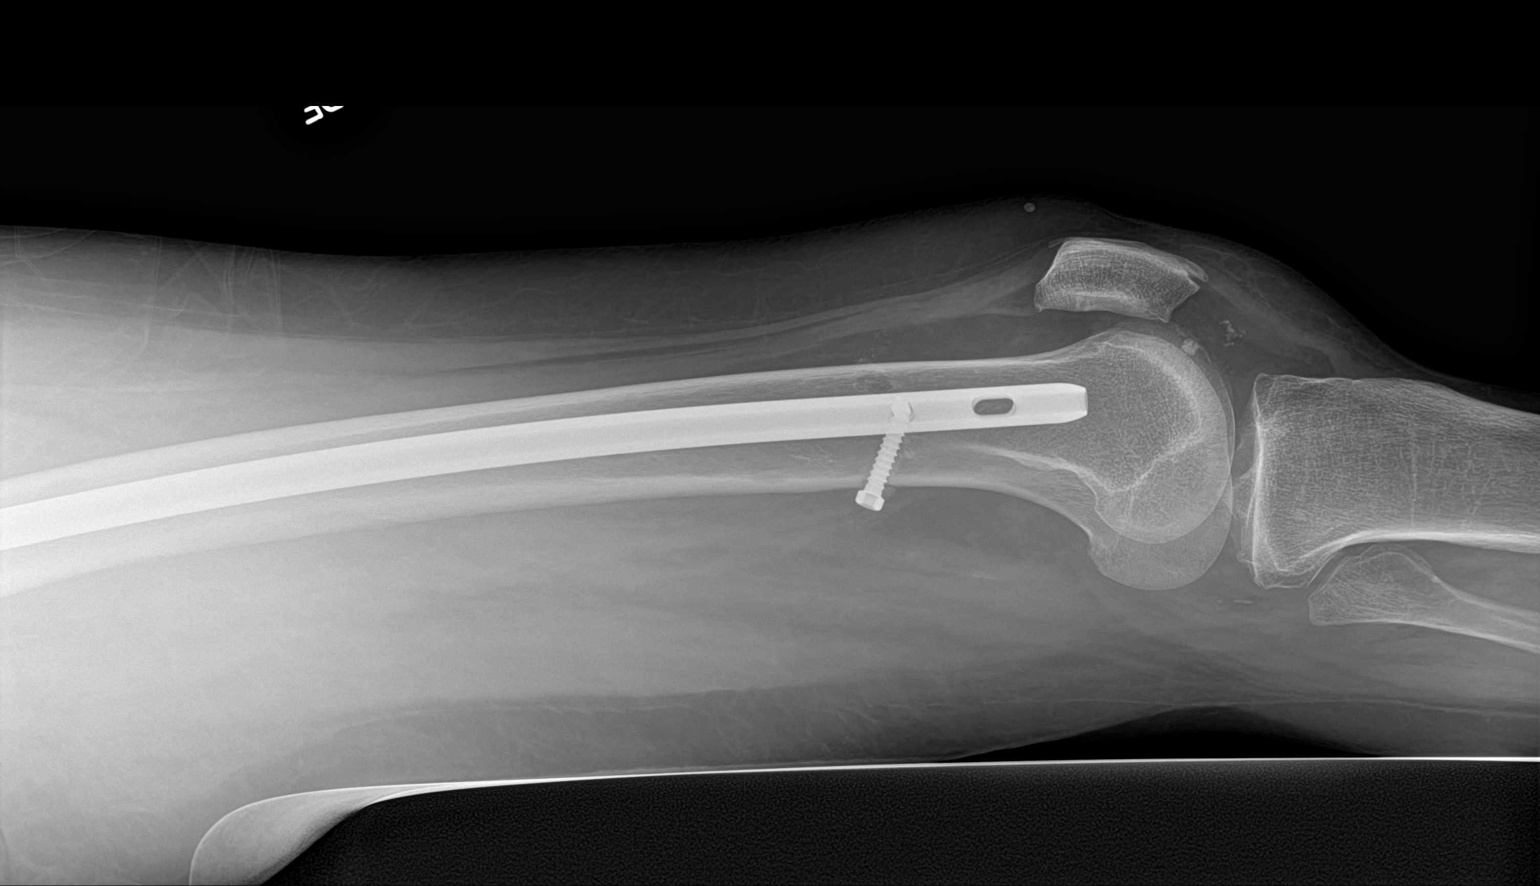

[femur ap (1 of 2)]
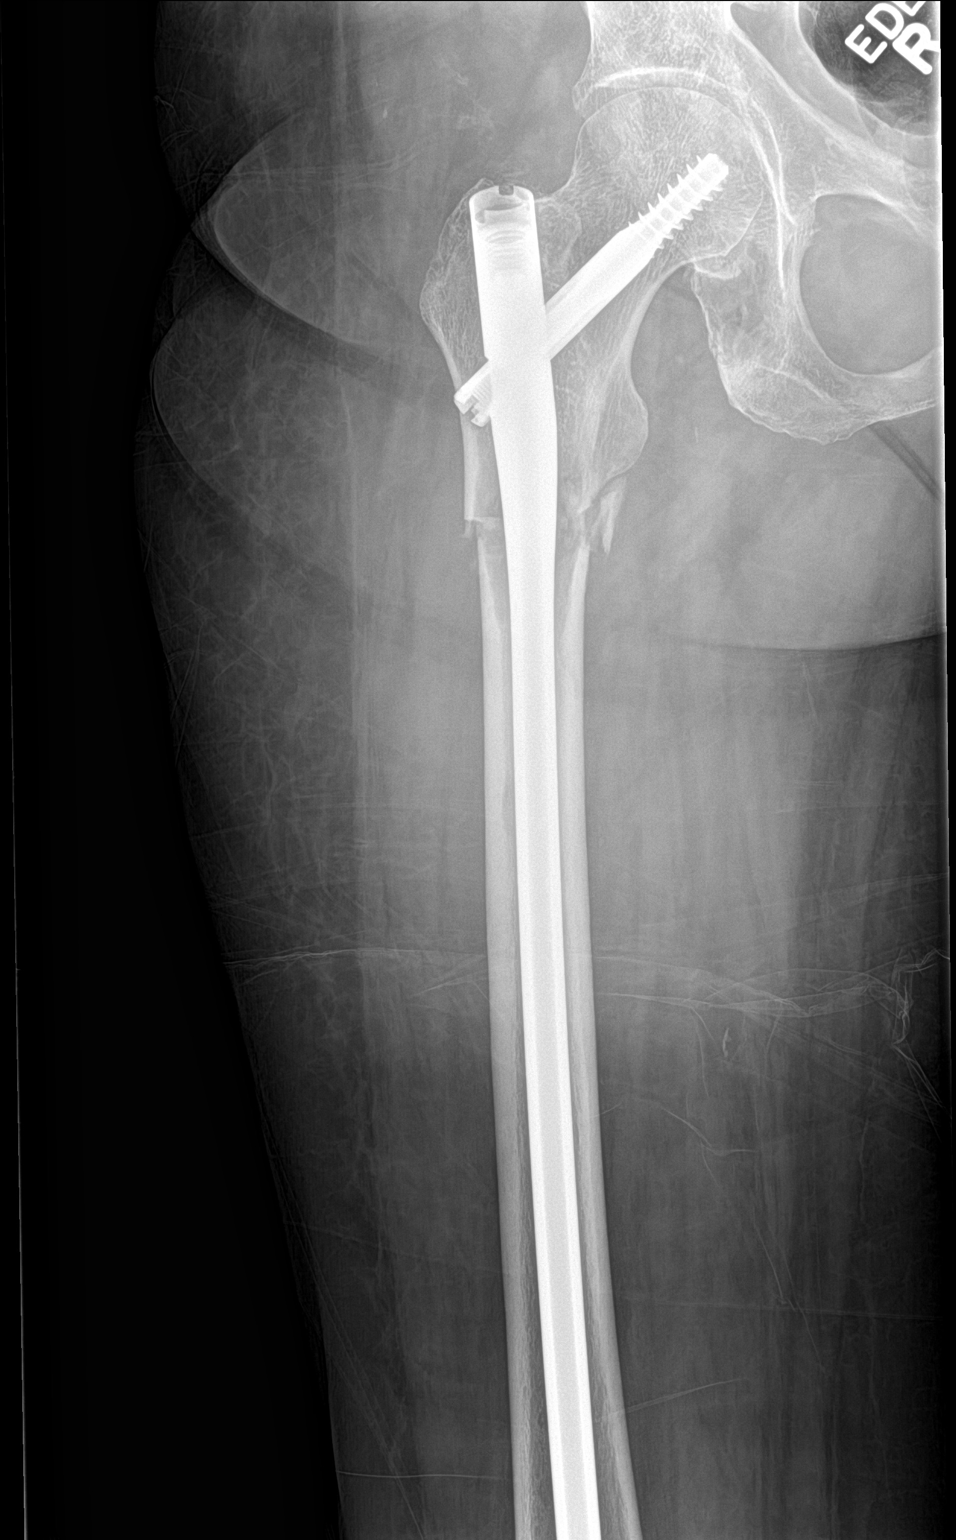

[femur ap (2 of 2)]
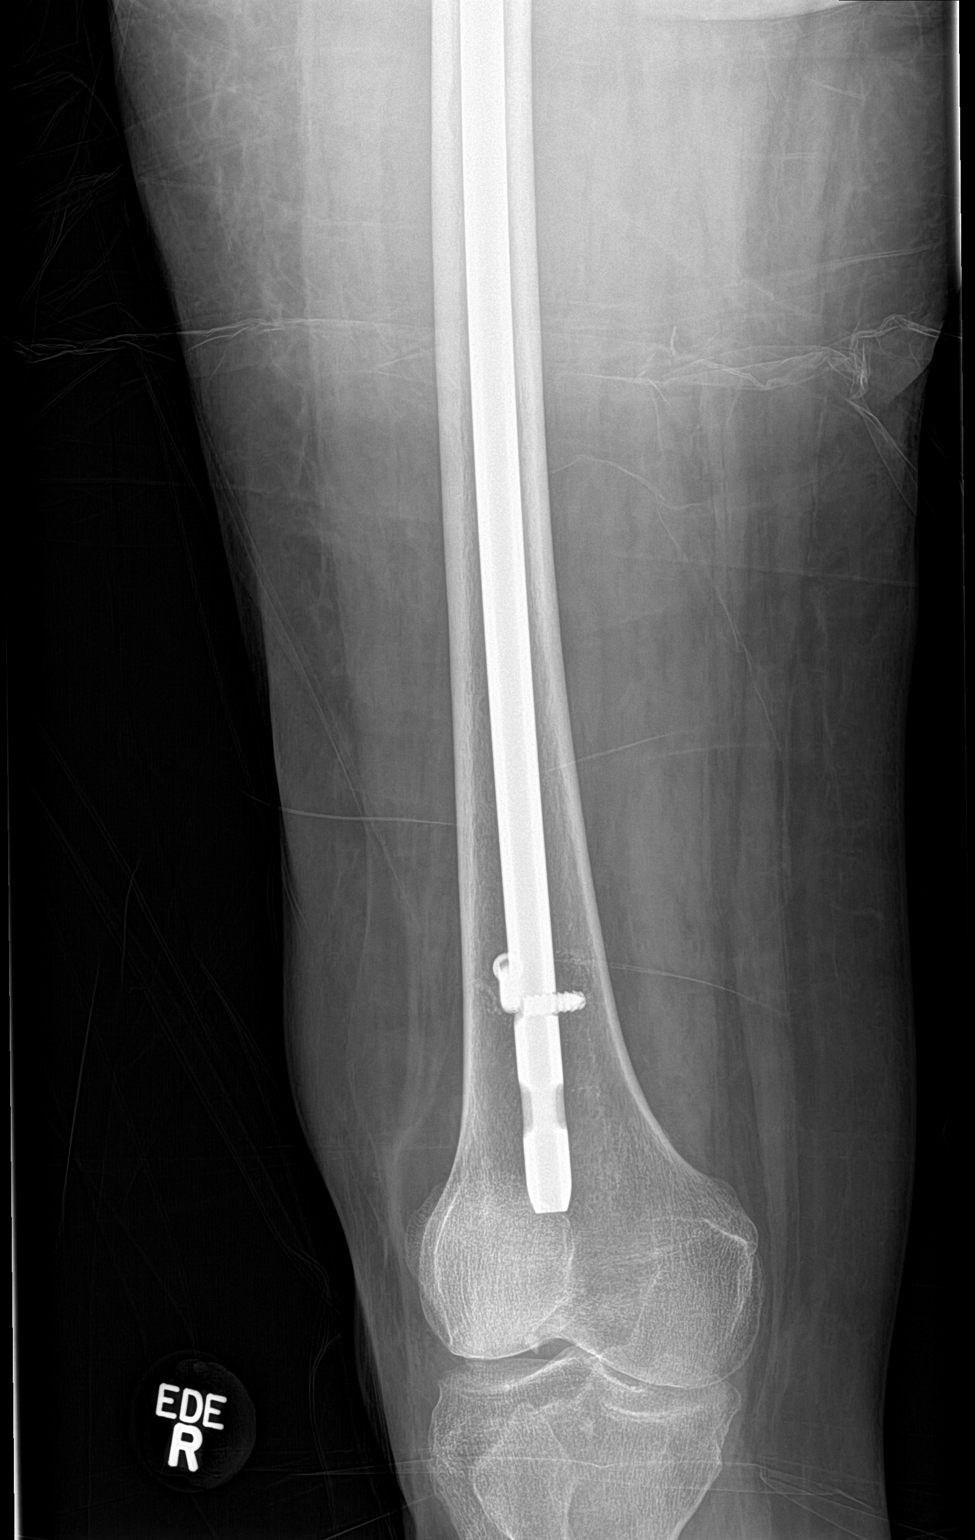

[3 of 3 positions shown; findings below may reference images not displayed]

FINDINGS: Compression screw and locking intra medullary rod right femur. Rod
extends into the distal femur. There are 2 screws in the distal
femur.

Lytic lesion subtrochanteric femur with pathologic fracture.
Fracture displacement has progressed slightly since 08/22/2015. No
other fracture. No other lytic lesion identified in the femur

Lytic lesion right ischium  unchanged compatible with myeloma.

Right hip joint normal.
IMPRESSION: Right femur screw and rod in good position

Lytic lesion proximal femur with pathologic fracture. Fracture
displacement slightly increased from 08/22/2015.

## 2016-12-08 IMAGING — DX DG HIP (WITH OR WITHOUT PELVIS) 2-3V*R*
3 series · 3 of 3 positions shown · non-contrast
Comparison: 08/22/2015, 08/14/2015

CLINICAL DATA: Right hip pain. Multiple myeloma. Recent right hip
pinning 08/22/2015

EXAM:
DG HIP (WITH OR WITHOUT PELVIS) 2-3V RIGHT

[pelvis ap]
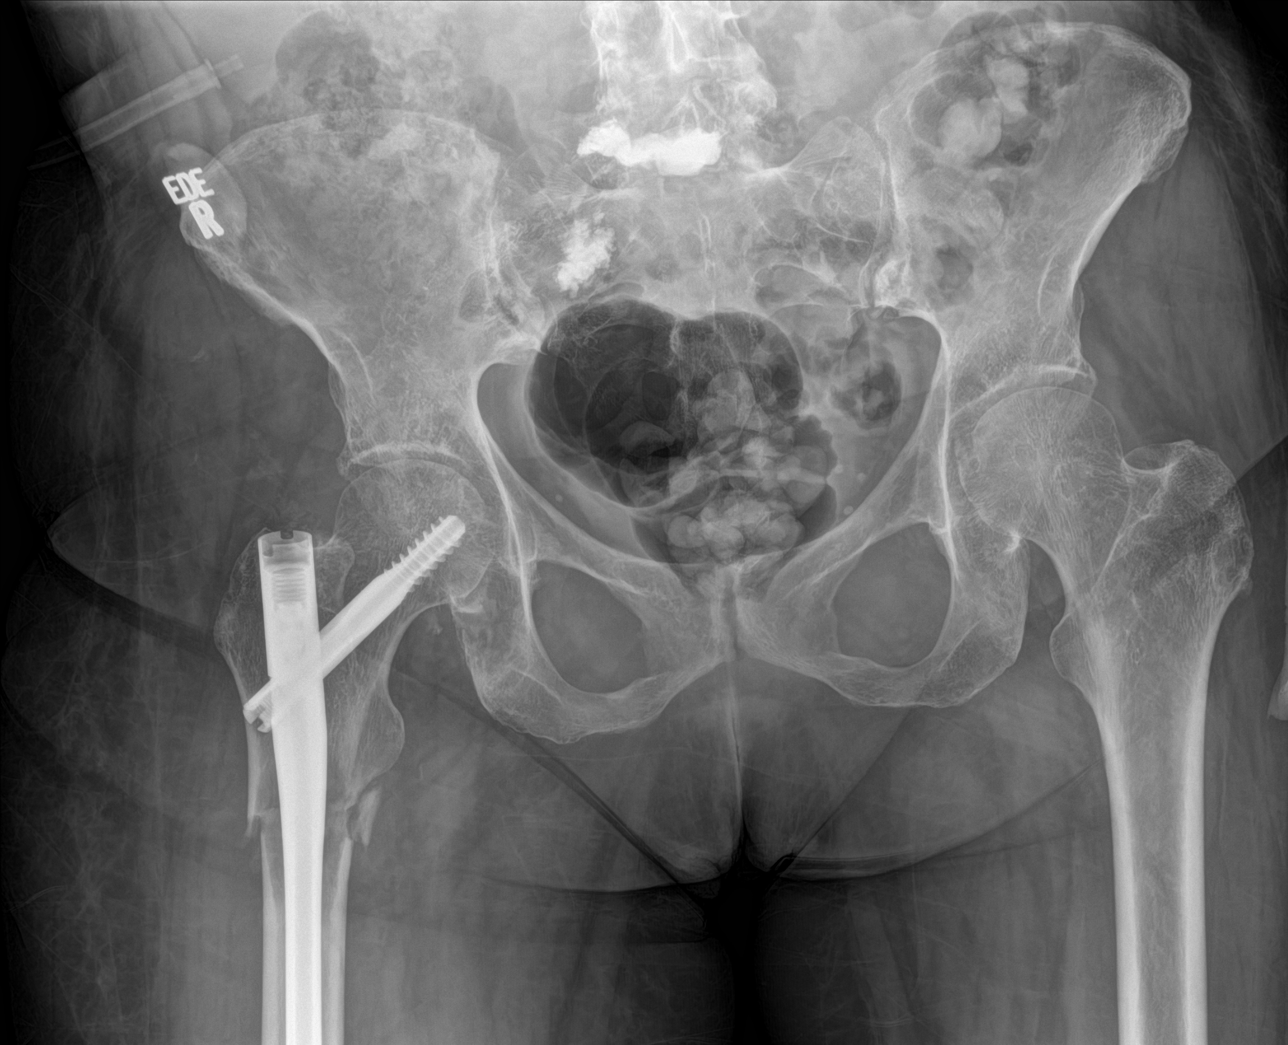

[hip ap]
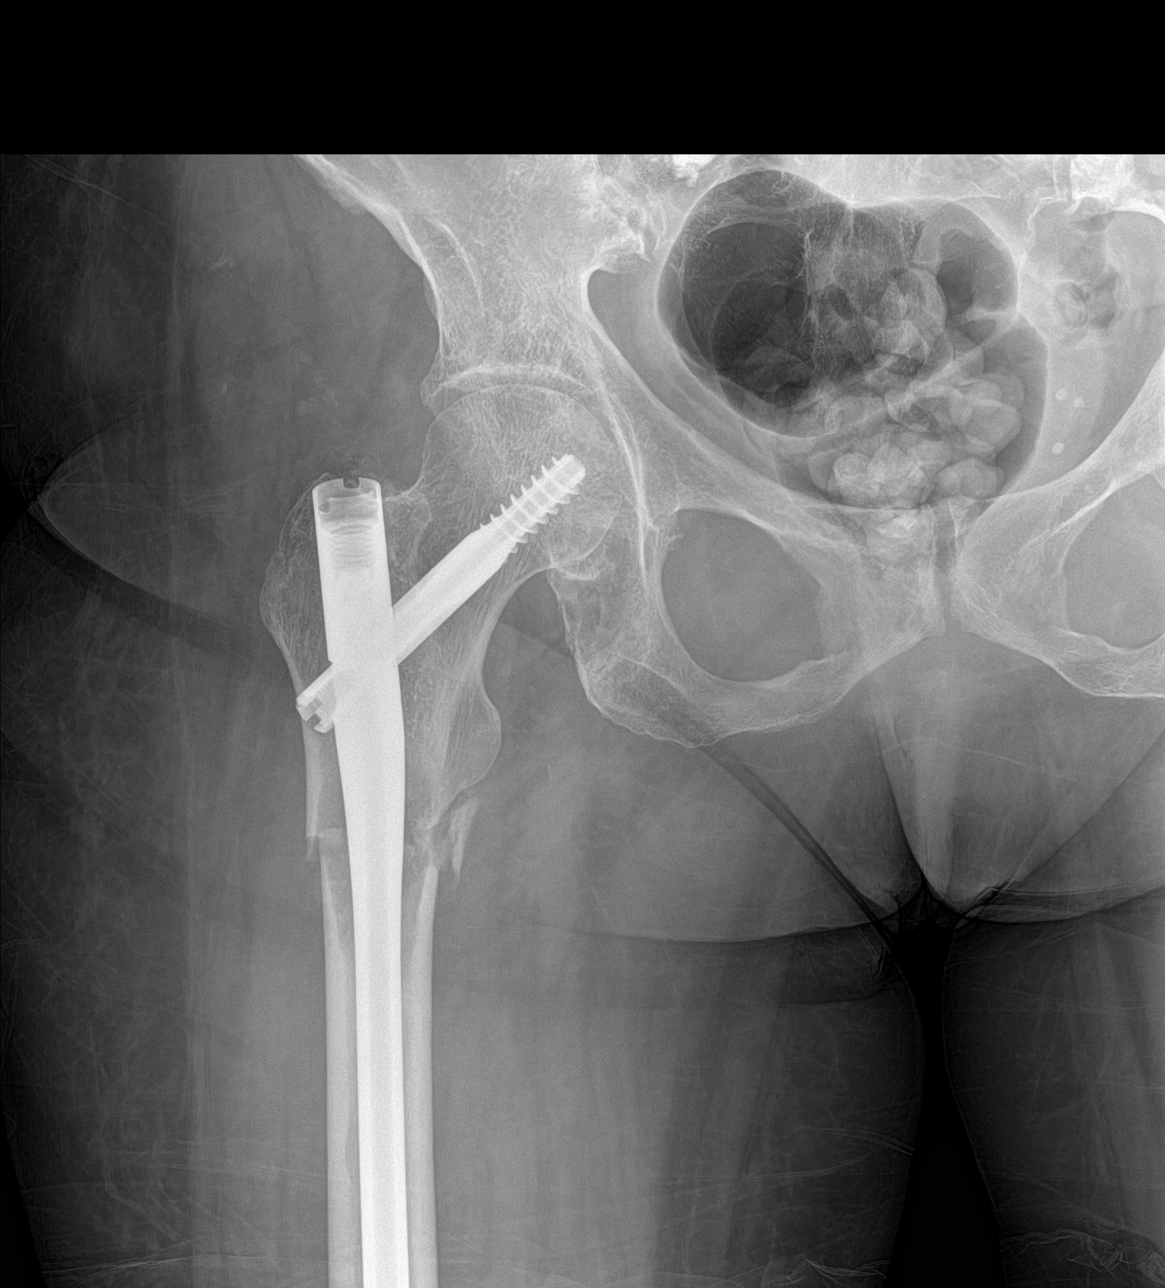

[hip lat]
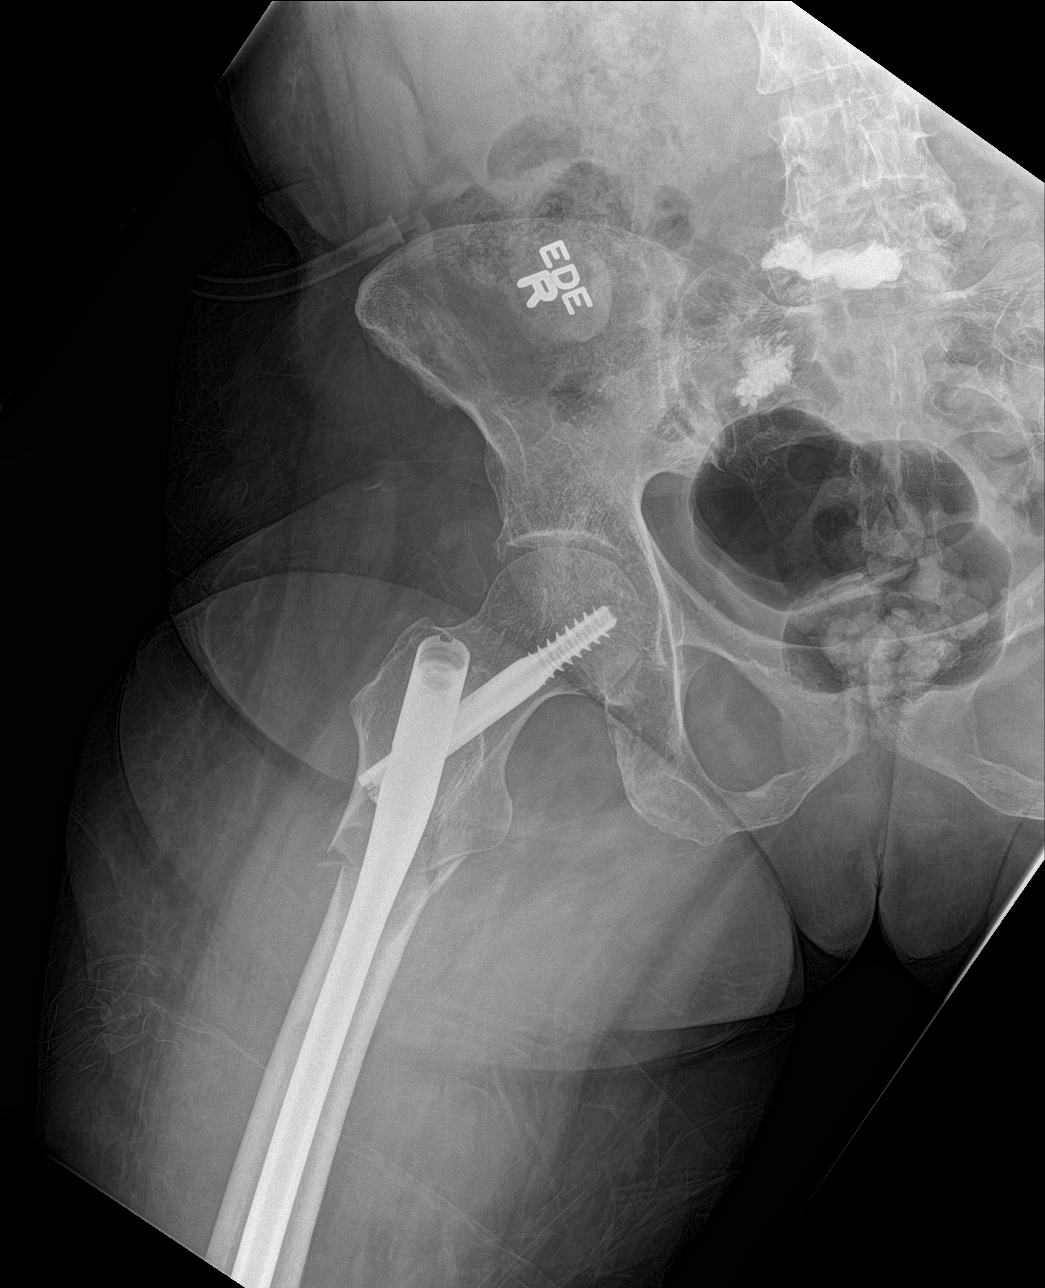

[3 of 3 positions shown; findings below may reference images not displayed]

FINDINGS: Compression screw and locking intra medullary rod in the right
femur. Hardware in satisfactory position.

Lytic lesion in the subtrochanteric femur with pathologic transverse
fracture. Slight increase in fracture displacement compared with
08/22/2015.

Lytic lesion right ischium  is unchanged from the prior study.

Cement vertebral augmentation  L5 and the right sacrum unchanged.
IMPRESSION: Lytic lesion proximal right femur with pathologic fracture. Slight
increase in displacement of the fracture compared with 08/22/2015.
Compression screw and rod remain in satisfactory position.

Lytic lesion right ischium unchanged.

## 2016-12-09 ENCOUNTER — Telehealth: Payer: Self-pay | Admitting: Pharmacist

## 2016-12-09 NOTE — Telephone Encounter (Signed)
Received fax from [employer] w/ [employer] Dx Verification Form.  I completed the form, had Dr. Alvy Bimler sign and faxed to [employer] today for copay assistance for Pomalyst.  Kennith Center, Pharm.D., CPP 12/09/2016@2 :[facility]

## 2016-12-09 NOTE — Telephone Encounter (Signed)
I received a call from [facility] re: [employer] (Debbie, rep; ph# [phone], ext [phone]).   Pt has pending applications for support w/ [employer] & [employer]. I contacted [employer] (ph# [phone]) and they're missing insurance card.  I faxed a copy of the insurance cards (fax# [phone]).  Pt is aware as I called to tell her.  She said over phone that her husband had also faxed a copy of her cards in to [employer] this AM. I was not able to reach anyone at [employer] due to high call volume.  Nor was I able to locate pt in the [employer] online portal.  We will continue to follow needs from [employer] as we are able to speak to them over the phone (ph# [phone] & also used their "local" number as listed online).  Pt is aware.  Kennith Center, Pharm.D., CPP 12/09/2016@12 :[facility]

## 2016-12-15 ENCOUNTER — Telehealth: Payer: Self-pay

## 2016-12-15 NOTE — Telephone Encounter (Signed)
Patient called to say she talked with Health support group and they care going to help with Pomalyst prescription. She stated they need the prescription faxed to there pharmacy but she is not sure what pharmacy.

## 2016-12-15 NOTE — Telephone Encounter (Signed)
Oral Chemotherapy Pharmacist Encounter  Received forwarded patient call from collaborative RN at Dr. Calton Dach desk that patient had questions about Pomalyst filling pharmacy.  I called [facility] at [phone] to inquire about status of patient's prescription. They last spoke with the patient 2/5 to alert her both [employer] and [employer] needed additional information from the office to complete her foundation copayment grant applications.  [facility] called [facility] and [employer] on 2/7 to inquire about missing information. This information was sent to the respective foundations on 2/7.  I was informed today by [facility] that they had placed patient's Pomalyst Rx on hold due to knowing she would need copayment support. I asked them to process prescription and put a rush on it. Once Rx is fully processed, they will attempt to use foundation grant information and will reach out to the [facility] office with any questions.  I called patient to update her with this information. Patient stated she spoke with [employer] this morning and her account is active with available money for copayment support. I told patient to expect a call from [employer] in the next 2 days about shipment of her Pomalyst.  She knows to call the office with any further issues. A new prescription or [employer] authorization is NOT needed at this time.   Johny Drilling, PharmD, BCPS, BCOP 12/15/2016  12:23 PM [facility]cology Clinic [phone]

## 2016-12-16 ENCOUNTER — Other Ambulatory Visit: Payer: Self-pay | Admitting: *Deleted

## 2016-12-16 DIAGNOSIS — D696 Thrombocytopenia, unspecified: Secondary | ICD-10-CM

## 2016-12-16 DIAGNOSIS — C9001 Multiple myeloma in remission: Secondary | ICD-10-CM

## 2016-12-16 MED ORDER — POMALIDOMIDE 2 MG PO CAPS: 2.0000 mg | ORAL_CAPSULE | Freq: Every day | ORAL | 0 refills | Status: AC

## 2016-12-18 ENCOUNTER — Encounter: Payer: Self-pay | Admitting: Adult Health

## 2016-12-18 ENCOUNTER — Ambulatory Visit (INDEPENDENT_AMBULATORY_CARE_PROVIDER_SITE_OTHER): Payer: Medicare Other | Admitting: Adult Health

## 2016-12-18 DIAGNOSIS — J449 Chronic obstructive pulmonary disease, unspecified: Secondary | ICD-10-CM | POA: Diagnosis not present

## 2016-12-18 MED ORDER — BUDESONIDE-FORMOTEROL FUMARATE 160-4.5 MCG/ACT IN AERO: INHALATION_SPRAY | RESPIRATORY_TRACT | 5 refills | Status: AC

## 2016-12-18 NOTE — Progress Notes (Signed)
$'@Patient'W$  ID: Kendra Johns, female    DOB: 1951-03-13, 66 y.o.   MRN: [id number]  Chief Complaint  Patient presents with  . Follow-up    COPD     Referring provider: Marin Olp, MD  HPI: 66 year old female, smoker followed for COPD  known history of multiple myeloma status post stem cell transplant in 2009 and 2013 with recurrence requiring chemotherapy since 2013.  12/18/2016 Follow up : COPD  Returns for a one-month follow-up. Last visit. Patient was started on Symbicort. And change from losartan to clonidine. Patient says that breathing is doing okay with no flare of cough or wheezing. She does continue to smoke. We discussed smoking cessation. She did bring some of her medications for a review. We updated the ones that she has with her medication list.w/ pt education .    Allergies  Allergen Reactions  . Codeine Itching  . Ibuprofen Other (See Comments)    REACTION: gi trouble    Immunization History  Administered Date(s) Administered  . Influenza Split 09/01/2011, 08/31/2012  . Influenza Whole 08/21/2009, 08/07/2010  . Influenza,inj,Quad PF,36+ Mos 08/25/2013, 07/10/2014, 08/01/2015, 07/22/2016  . PPD Test 08/26/2015  . Pneumococcal-Unspecified 08/02/2013  . Td 02/11/2010    Past Medical History:  Diagnosis Date  . Acute intractable headache 11/14/2015  . Allergy   . Anxiety   . Chronic fatigue 03/07/2015  . Chronic low back pain   . Cough 08/02/2014  . Dehydration 11/28/2013  . Depression   . DEPRESSION 08/31/2006   Qualifier: Diagnosis of  By: [name], [name]    . Diverticulosis of colon   . DIVERTICULOSIS, COLON 08/31/2006  . Dysuria 10/08/2015  . External hemorrhoids   . Fever 11/28/2013  . GERD with stricture   . Helicobacter pylori gastritis 2001   ? if treated  . Hepatitis B virus infection   . Hepatitis C    genotype 1  . Hx of adenomatous colonic polyps 2007  . Hyperlipidemia   . Hypertension   . IBS (irritable bowel syndrome)   .  Multiple myeloma    kappa light chain, s/p high-dose chemo/stem cell tx - [facility]  . Muscle cramp 11/21/2014  . Nausea alone 01/22/2014  . Oral infection 02/19/2016  . Osteoarthritis   . S/P radiation therapy 06/05/15-06/18/15   C7-T1   . Sciatic nerve pain    with Dr. Earle Gell  . Sleep apnea   . Unspecified vitamin D deficiency 09/22/2013  . Vitamin D deficiency 05/28/2015    Tobacco History: History  Smoking Status  . Current Every Day Smoker  . Packs/day: 0.25  . Years: 44.00  . Types: Cigarettes  Smokeless Tobacco  . Never Used   Ready to quit: No Counseling given: Yes   Outpatient Encounter Prescriptions as of 12/18/2016  Medication Sig  . acyclovir (ZOVIRAX) 400 MG tablet TAKE 1 TABLET BY MOUTH EVERY DAY  . albuterol (PROAIR HFA) 108 (90 Base) MCG/ACT inhaler 2 puffs every 4 hours as needed only  if your can't catch your breath  . ALPRAZolam (XANAX) 0.25 MG tablet Take 1 tablet (0.25 mg total) by mouth at bedtime as needed for anxiety.  Marland Kitchen aspirin 81 MG tablet Take 81 mg by mouth daily.  . budesonide-formoterol (SYMBICORT) 160-4.5 MCG/ACT inhaler Take 2 puffs first thing in am and then another 2 puffs about 12 hours later.  . cloNIDine (CATAPRES) 0.1 MG tablet Take 1 tablet (0.1 mg total) by mouth 2 (two) times daily.  Marland Kitchen dexamethasone (  DECADRON) 4 MG tablet Take 1 tablet (4 mg total) by mouth daily.  Marland Kitchen gabapentin (NEURONTIN) 600 MG tablet Take 1 tablet (600 mg total) by mouth 3 (three) times daily.  Marland Kitchen levothyroxine (SYNTHROID, LEVOTHROID) 50 MCG tablet TAKE 1 TABLET BY MOUTH EVERY DAY BEFORE BREAKFAST  . lidocaine-prilocaine (EMLA) cream Apply 1 application topically as needed.  Marland Kitchen losartan (COZAAR) 50 MG tablet TAKE 1 TABLET (50 MG TOTAL) BY MOUTH DAILY.  Marland Kitchen morphine (MS CONTIN) 100 MG 12 hr tablet Take 100 mg by mouth every 8 (eight) hours.   . Oxycodone HCl 20 MG TABS   . pomalidomide (POMALYST) 2 MG capsule Take 1 capsule (2 mg total) by mouth daily. Take with water on  days 1-21. Repeat every 28 days.  . prochlorperazine (COMPAZINE) 10 MG tablet Take 1 tablet (10 mg total) by mouth every 6 (six) hours as needed. for nausea  . Respiratory Therapy Supplies (FLUTTER) DEVI Use as indicated  . Vitamin D, Ergocalciferol, (DRISDOL) 50000 units CAPS capsule Take 1 capsule (50,000 Units total) by mouth every 7 (seven) days.  . [DISCONTINUED] budesonide-formoterol (SYMBICORT) 160-4.5 MCG/ACT inhaler Take 2 puffs first thing in am and then another 2 puffs about 12 hours later.  . [DISCONTINUED] cyclobenzaprine (FLEXERIL) 10 MG tablet Take 1 tablet (10 mg total) by mouth 3 (three) times daily as needed for muscle spasms. (Patient not taking: Reported on 12/18/2016)   Facility-Administered Encounter Medications as of 12/18/2016  Medication  . sodium chloride 0.9 % injection 10 mL     Review of Systems  Constitutional:   No  weight loss, night sweats,  Fevers, chills,  +fatigue, or  lassitude.  HEENT:   No headaches,  Difficulty swallowing,  Tooth/dental problems, or  Sore throat,                No sneezing, itching, ear ache, nasal congestion, post nasal drip,   CV:  No chest pain,  Orthopnea, PND, swelling in lower extremities, anasarca, dizziness, palpitations, syncope.   GI  No heartburn, indigestion, abdominal pain, nausea, vomiting, diarrhea, change in bowel habits, loss of appetite, bloody stools.   Resp:  .  No wheezing.  No chest wall deformity  Skin: no rash or lesions.  GU: no dysuria, change in color of urine, no urgency or frequency.  No flank pain, no hematuria   MS:  No joint pain or swelling.  No decreased range of motion.  No back pain.    Physical Exam  BP 120/90 (BP Location: Right Arm, Patient Position: Sitting, Cuff Size: Normal)   Pulse 76   Ht '5\' 4"'$  (1.626 m)   Wt 152 lb 9.6 oz (69.2 kg)   SpO2 95%   BMI 26.19 kg/m   GEN: A/Ox3; pleasant , NAD,    [place]/AT,  EACs-clear, TMs-wnl, NOSE-clear, THROAT-clear, no lesions, no postnasal  drip or exudate noted.   NECK:  Supple w/ fair ROM; no JVD; normal carotid impulses w/o bruits; no thyromegaly or nodules palpated; no lymphadenopathy.    RESP  Clear  P & A; w/o, wheezes/ rales/ or rhonchi. no accessory muscle use, no dullness to percussion  CARD:  RRR, no m/r/g, no peripheral edema, pulses intact, no cyanosis or clubbing.  GI:   Soft & nt; nml bowel sounds; no organomegaly or masses detected.   Musco: Warm bil, no deformities or joint swelling noted.   Neuro: alert, no focal deficits noted.    Skin: Warm, no lesions or rashes  Lab Results:  CBC    Component Value Date/Time   WBC 5.9 11/26/2016 1027   WBC 5.8 09/12/2015 0620   RBC 4.54 11/26/2016 1027   RBC 3.58 (L) 09/12/2015 0620   HGB 14.2 11/26/2016 1027   HCT 42.3 11/26/2016 1027   PLT 163 11/26/2016 1027   MCV 93.2 11/26/2016 1027   MCH 31.3 11/26/2016 1027   MCH 31.3 09/12/2015 0620   MCHC 33.6 11/26/2016 1027   MCHC 31.5 09/12/2015 0620   RDW 18.6 (H) 11/26/2016 1027   LYMPHSABS 0.4 (L) 11/26/2016 1027   MONOABS 0.5 11/26/2016 1027   EOSABS 0.1 11/26/2016 1027   BASOSABS 0.0 11/26/2016 1027    BMET    Component Value Date/Time   NA 138 11/26/2016 1027   K 4.1 11/26/2016 1027   CL 107 09/11/2015 2255   CL 107 03/10/2013 1014   CO2 24 11/26/2016 1027   GLUCOSE 117 11/26/2016 1027   GLUCOSE 111 (H) 03/10/2013 1014   BUN 18.0 11/26/2016 1027   CREATININE 0.8 11/26/2016 1027   CALCIUM 8.7 11/26/2016 1027   GFRNONAA >60 09/12/2015 0620   GFRAA >60 09/12/2015 0620    BNP No results found for: BNP  ProBNP No results found for: PROBNP  Imaging:   Assessment & Plan:   COPD GOLD I Improved symptoms control on Symbicort  Smoking cessation   Plan  Patient Instructions  Continue on Symbicort 2 puffs Twice daily  , rinse after use.  Work on not smoking .  follow up Dr. Melvyn Novas  In 3 months and As needed          Rexene Edison, NP 12/18/2016

## 2016-12-18 NOTE — Patient Instructions (Addendum)
Continue on Symbicort 2 puffs Twice daily  , rinse after use.  Work on not smoking .  follow up Dr. Melvyn Novas  In 3 months and As needed

## 2016-12-18 NOTE — Assessment & Plan Note (Signed)
Improved symptoms control on Symbicort  Smoking cessation   Plan  Patient Instructions  Continue on Symbicort 2 puffs Twice daily  , rinse after use.  Work on not smoking .  follow up Dr. Melvyn Novas  In 3 months and As needed

## 2016-12-19 NOTE — Progress Notes (Signed)
Chart and office note reviewed in detail - note did not bring all meds as rec for med calendar > agree with a/p as outlined

## 2016-12-24 ENCOUNTER — Ambulatory Visit: Payer: Medicare Other

## 2016-12-24 ENCOUNTER — Telehealth: Payer: Self-pay | Admitting: *Deleted

## 2016-12-24 ENCOUNTER — Encounter: Payer: Self-pay | Admitting: Hematology and Oncology

## 2016-12-24 ENCOUNTER — Other Ambulatory Visit (HOSPITAL_BASED_OUTPATIENT_CLINIC_OR_DEPARTMENT_OTHER): Payer: Medicare Other

## 2016-12-24 ENCOUNTER — Ambulatory Visit (HOSPITAL_BASED_OUTPATIENT_CLINIC_OR_DEPARTMENT_OTHER): Payer: Medicare Other | Admitting: Hematology and Oncology

## 2016-12-24 ENCOUNTER — Ambulatory Visit (HOSPITAL_BASED_OUTPATIENT_CLINIC_OR_DEPARTMENT_OTHER): Payer: Medicare Other

## 2016-12-24 ENCOUNTER — Ambulatory Visit (HOSPITAL_COMMUNITY)
Admission: RE | Admit: 2016-12-24 | Discharge: 2016-12-24 | Disposition: A | Discharge status: Home / Self Care | Payer: Medicare Other | Source: Ambulatory Visit | Attending: Hematology and Oncology | Admitting: Hematology and Oncology

## 2016-12-24 ENCOUNTER — Telehealth: Payer: Self-pay | Admitting: Hematology and Oncology

## 2016-12-24 ENCOUNTER — Telehealth: Payer: Self-pay | Admitting: Medical Oncology

## 2016-12-24 VITALS — BP 178/86 | HR 52 | Temp 98.9°F | Resp 16

## 2016-12-24 VITALS — BP 154/86 | HR 69 | Temp 98.3°F | Resp 17 | Ht 64.0 in | Wt 153.2 lb

## 2016-12-24 DIAGNOSIS — M25552 Pain in left hip: Secondary | ICD-10-CM | POA: Diagnosis not present

## 2016-12-24 DIAGNOSIS — C9002 Multiple myeloma in relapse: Secondary | ICD-10-CM

## 2016-12-24 DIAGNOSIS — E249 Cushing's syndrome, unspecified: Secondary | ICD-10-CM

## 2016-12-24 DIAGNOSIS — C9001 Multiple myeloma in remission: Secondary | ICD-10-CM

## 2016-12-24 DIAGNOSIS — Z5112 Encounter for antineoplastic immunotherapy: Secondary | ICD-10-CM | POA: Diagnosis present

## 2016-12-24 DIAGNOSIS — E242 Drug-induced Cushing's syndrome: Secondary | ICD-10-CM

## 2016-12-24 DIAGNOSIS — Z720 Tobacco use: Secondary | ICD-10-CM | POA: Diagnosis not present

## 2016-12-24 DIAGNOSIS — M79605 Pain in left leg: Secondary | ICD-10-CM | POA: Diagnosis not present

## 2016-12-24 DIAGNOSIS — M545 Low back pain, unspecified: Secondary | ICD-10-CM

## 2016-12-24 DIAGNOSIS — I10 Essential (primary) hypertension: Secondary | ICD-10-CM

## 2016-12-24 DIAGNOSIS — G893 Neoplasm related pain (acute) (chronic): Secondary | ICD-10-CM

## 2016-12-24 DIAGNOSIS — R748 Abnormal levels of other serum enzymes: Secondary | ICD-10-CM

## 2016-12-24 DIAGNOSIS — B182 Chronic viral hepatitis C: Secondary | ICD-10-CM | POA: Diagnosis not present

## 2016-12-24 DIAGNOSIS — F17210 Nicotine dependence, cigarettes, uncomplicated: Secondary | ICD-10-CM

## 2016-12-24 LAB — CBC WITH DIFFERENTIAL/PLATELET
BASO%: 0.5 % (ref 0.0–2.0)
Basophils Absolute: 0 10*3/uL (ref 0.0–0.1)
EOS%: 0.5 % (ref 0.0–7.0)
Eosinophils Absolute: 0 10*3/uL (ref 0.0–0.5)
HEMATOCRIT: 44.8 % (ref 34.8–46.6)
HGB: 14.8 g/dL (ref 11.6–15.9)
LYMPH#: 0.3 10*3/uL — AB (ref 0.9–3.3)
LYMPH%: 4.9 % — ABNORMAL LOW (ref 14.0–49.7)
MCH: 32.3 pg (ref 25.1–34.0)
MCHC: 33 g/dL (ref 31.5–36.0)
MCV: 98.1 fL (ref 79.5–101.0)
MONO#: 0.4 10*3/uL (ref 0.1–0.9)
MONO%: 8 % (ref 0.0–14.0)
NEUT%: 86.1 % — ABNORMAL HIGH (ref 38.4–76.8)
NEUTROS ABS: 4.6 10*3/uL (ref 1.5–6.5)
PLATELETS: 104 10*3/uL — AB (ref 145–400)
RBC: 4.57 10*6/uL (ref 3.70–5.45)
RDW: 21.5 % — AB (ref 11.2–14.5)
WBC: 5.4 10*3/uL (ref 3.9–10.3)

## 2016-12-24 LAB — COMPREHENSIVE METABOLIC PANEL
ALK PHOS: 164 U/L — AB (ref 40–150)
ALT: 126 U/L — ABNORMAL HIGH (ref 0–55)
ANION GAP: 10 meq/L (ref 3–11)
AST: 107 U/L — ABNORMAL HIGH (ref 5–34)
Albumin: 3.2 g/dL — ABNORMAL LOW (ref 3.5–5.0)
BUN: 15.4 mg/dL (ref 7.0–26.0)
CALCIUM: 9 mg/dL (ref 8.4–10.4)
CO2: 23 mEq/L (ref 22–29)
Chloride: 106 mEq/L (ref 98–109)
Creatinine: 0.8 mg/dL (ref 0.6–1.1)
Glucose: 163 mg/dl — ABNORMAL HIGH (ref 70–140)
Potassium: 4 mEq/L (ref 3.5–5.1)
SODIUM: 140 meq/L (ref 136–145)
Total Bilirubin: 1.26 mg/dL — ABNORMAL HIGH (ref 0.20–1.20)
Total Protein: 5.4 g/dL — ABNORMAL LOW (ref 6.4–8.3)

## 2016-12-24 MED ORDER — SODIUM CHLORIDE 0.9 % IJ SOLN
10.0000 mL | INTRAMUSCULAR | Status: DC | PRN
  Administered 2016-12-24: 10 mL via INTRAVENOUS
  Filled 2016-12-24: qty 10

## 2016-12-24 MED ORDER — SODIUM CHLORIDE 0.9% FLUSH
10.0000 mL | INTRAVENOUS | Status: DC | PRN
  Administered 2016-12-24: 10 mL
  Filled 2016-12-24: qty 10

## 2016-12-24 MED ORDER — SODIUM CHLORIDE 0.9 % IV SOLN
15.8000 mg/kg | Freq: Once | INTRAVENOUS | Status: AC
  Administered 2016-12-24: 1100 mg via INTRAVENOUS
  Filled 2016-12-24: qty 40

## 2016-12-24 MED ORDER — MONTELUKAST SODIUM 10 MG PO TABS
ORAL_TABLET | ORAL | Status: AC
  Filled 2016-12-24: qty 1

## 2016-12-24 MED ORDER — ACETAMINOPHEN 325 MG PO TABS
ORAL_TABLET | ORAL | Status: AC
Start: 2016-12-24 — End: 2016-12-24
  Filled 2016-12-24: qty 2

## 2016-12-24 MED ORDER — METHYLPREDNISOLONE SODIUM SUCC 125 MG IJ SOLR
125.0000 mg | Freq: Once | INTRAMUSCULAR | Status: AC
  Administered 2016-12-24: 125 mg via INTRAVENOUS

## 2016-12-24 MED ORDER — DIPHENHYDRAMINE HCL 25 MG PO CAPS
ORAL_CAPSULE | ORAL | Status: AC
  Filled 2016-12-24: qty 2

## 2016-12-24 MED ORDER — METHYLPREDNISOLONE SODIUM SUCC 125 MG IJ SOLR
INTRAMUSCULAR | Status: AC
  Filled 2016-12-24: qty 2

## 2016-12-24 MED ORDER — HEPARIN SOD (PORK) LOCK FLUSH 100 UNIT/ML IV SOLN
500.0000 [IU] | Freq: Once | INTRAVENOUS | Status: AC | PRN
  Administered 2016-12-24: 500 [IU]
  Filled 2016-12-24: qty 5

## 2016-12-24 MED ORDER — MONTELUKAST SODIUM 10 MG PO TABS
10.0000 mg | ORAL_TABLET | Freq: Once | ORAL | Status: AC
  Administered 2016-12-24: 10 mg via ORAL

## 2016-12-24 MED ORDER — ACETAMINOPHEN 325 MG PO TABS
650.0000 mg | ORAL_TABLET | Freq: Once | ORAL | Status: AC
  Administered 2016-12-24: 650 mg via ORAL

## 2016-12-24 MED ORDER — PROCHLORPERAZINE MALEATE 10 MG PO TABS
ORAL_TABLET | ORAL | Status: AC
  Filled 2016-12-24: qty 1

## 2016-12-24 MED ORDER — PROCHLORPERAZINE MALEATE 10 MG PO TABS
10.0000 mg | ORAL_TABLET | Freq: Once | ORAL | Status: AC
  Administered 2016-12-24: 10 mg via ORAL

## 2016-12-24 MED ORDER — SODIUM CHLORIDE 0.9 % IV SOLN
Freq: Once | INTRAVENOUS | Status: AC
  Administered 2016-12-24: 10:00:00 via INTRAVENOUS

## 2016-12-24 MED ORDER — DIPHENHYDRAMINE HCL 25 MG PO CAPS
50.0000 mg | ORAL_CAPSULE | Freq: Once | ORAL | Status: AC
  Administered 2016-12-24: 50 mg via ORAL

## 2016-12-24 NOTE — Progress Notes (Signed)
[place] OFFICE PROGRESS NOTE  Patient Care Team: Marin Olp, MD as PCP - General (Family Medicine) Jeanann Lewandowsky, MD as Consulting Physician (Medical Oncology) Marin Olp, MD as Consulting Physician (Family Medicine) Heath Lark, MD as Consulting Physician (Hematology and Oncology) [name], MD as Consulting Physician (Orthopedic Surgery)  SUMMARY OF ONCOLOGIC HISTORY: Oncology History   Multiple myeloma, kappa light chain disease, Durie-Salmon stage III       Multiple myeloma in remission ([place])   07/16/2006 Bone Marrow Biopsy    BM biopsy is non-diagnostic      09/21/2006 Procedure    L5 vertebral biopsy 5% plasma cell      10/25/2006 Bone Marrow Biopsy    BM biopsy is hypercellular with 5% plasma cell      11/17/2006 Procedure    L5 biopsy confirmed plasmacytoma      01/03/2007 - 01/10/2007 Radiation Therapy    Approximate date only, received RT for plasmacytoma followed by surgery      09/14/2007 Initial Diagnosis    MULTIPLE  MYELOMA      10/05/2007 Bone Marrow Biopsy    Bm biopsy was negative      06/18/2008 Bone Marrow Biopsy    BM biopsy was negative      07/26/2008 Bone Marrow Transplant    Stem cell transplant at [facility]      04/13/2011 Relapse/Recurrence    Disease relapse      04/14/2011 Bone Marrow Biopsy    Bm biopsy showed 2 % plasma cell      04/27/2011 Relapse/Recurrence    Disease relapse, treated with Velcade/Cytoxan/Dex      09/07/2011 - 07/28/2013 Chemotherapy    She has been receiving Velcade      12/27/2011 Bone Marrow Transplant    2nd transplant at [facility]      07/28/2013 Relapse/Recurrence    Chemo is stopped due to progression of disease      08/29/2013 Imaging    PEt/CT showed recurrence of disease with new lesion on her rib with compression fracture      09/13/2013 Bone Marrow Biopsy    BM biopsy is hypercellular with 6% plasma cell      10/10/2013 - 10/20/2013 Radiation Therapy   Started on palliative XRT for rib pain      11/27/2013 - 04/06/2014 Chemotherapy    The patient starts chemotherapy with Carfilzomib      07/19/2014 Imaging    Repeat bloodwork and PET/CT scan shows significant disease progression.      08/02/2014 - 03/06/2015 Chemotherapy    She enrolled in clinical trial using combination therapy with Revlimid, dexamethasone and Elotuzumab      03/07/2015 - 06/04/2015 Chemotherapy    She is on maintenance Revlimid only without dexamethasone      05/27/2015 Imaging     MRI spine showed new compression fracture.      06/04/2015 - 06/18/2015 Radiation Therapy     she received palliative radiation therapy to 25 Gy C7-T1      07/10/2015 - 08/07/2015 Chemotherapy    She received Elotuzumab, dex and Revlimid. Rx is stopped due to progression      08/01/2015 Imaging    MRI of the spine was performed today due to new onset of worsening right hip pain/flank area. MRI show mild progression of the L1 vertebral body.      08/21/2015 - 08/26/2015 Hospital Admission    She was admitted to the hospital due to pathologic fracture to  right femur      08/22/2015 Surgery    She had IM nail to fractured femur      08/23/2015 - 09/20/2015 Radiation Therapy    She received XRT to her L1 L2 and Right Femur      09/05/2015 - 09/06/2015 Hospital Admission    She had recurrent admission due to displaced fracture, managed conservatively      09/12/2015 - 09/17/2015 Hospital Admission    She had recurrent admission for COPD exacerbation      09/30/2015 -  Chemotherapy    She was started on Pomalyst      12/10/2015 -  Chemotherapy    She started weekly Daratumumab along with Pomalyst      12/03/2016 Imaging    US liver: no acute abnormality of the gallbladder, common bile duct, or liver. subcentimeter septated focus in the right hepatic lobe is likely stable when compared to the CT images from a PET-CT study of July 19, 2014.       INTERVAL HISTORY: Please see  below for problem oriented charting. She returns with her daughter today. She brought with her a letter from her insurance company denying coverage for [employer]. She got a free sample of Pomalyst.  She resumed treatment recently on 12/17/2016. She complained of severe left hip pain.  She is taking dexamethasone daily and is causing cushingoid features without significant benefit from pain control. She denies recent cough, fever or chills.  She continues to smoke. No recent nausea, vomiting or diarrhea.  REVIEW OF SYSTEMS:   Constitutional: Denies fevers, chills or abnormal weight loss Eyes: Denies blurriness of vision Ears, nose, mouth, throat, and face: Denies mucositis or sore throat Respiratory: Denies cough, dyspnea or wheezes Cardiovascular: Denies palpitation, chest discomfort or lower extremity swelling Gastrointestinal:  Denies nausea, heartburn or change in bowel habits Skin: Denies abnormal skin rashes Lymphatics: Denies new lymphadenopathy or easy bruising Neurological:Denies numbness, tingling or new weaknesses Behavioral/Psych: Mood is stable, no new changes  All other systems were reviewed with the patient and are negative.  I have reviewed the past medical history, past surgical history, social history and family history with the patient and they are unchanged from previous note.  ALLERGIES:  is allergic to codeine and ibuprofen.  MEDICATIONS:  Current Outpatient Prescriptions  Medication Sig Dispense Refill  . acyclovir (ZOVIRAX) 400 MG tablet TAKE 1 TABLET BY MOUTH EVERY DAY 30 tablet 2  . albuterol (PROAIR HFA) 108 (90 Base) MCG/ACT inhaler 2 puffs every 4 hours as needed only  if your can't catch your breath 1 Inhaler 0  . ALPRAZolam (XANAX) 0.25 MG tablet Take 1 tablet (0.25 mg total) by mouth at bedtime as needed for anxiety. 60 tablet 0  . aspirin 81 MG tablet Take 81 mg by mouth daily.    . budesonide-formoterol (SYMBICORT) 160-4.5 MCG/ACT inhaler Take 2 puffs  first thing in am and then another 2 puffs about 12 hours later. 1 Inhaler 5  . cloNIDine (CATAPRES) 0.1 MG tablet Take 1 tablet (0.1 mg total) by mouth 2 (two) times daily. 60 tablet 11  . dexamethasone (DECADRON) 4 MG tablet Take 1 tablet (4 mg total) by mouth daily. 60 tablet 1  . gabapentin (NEURONTIN) 600 MG tablet Take 1 tablet (600 mg total) by mouth 3 (three) times daily. 90 tablet 3  . levothyroxine (SYNTHROID, LEVOTHROID) 50 MCG tablet TAKE 1 TABLET BY MOUTH EVERY DAY BEFORE BREAKFAST 90 tablet 1  . lidocaine-prilocaine (EMLA) cream Apply 1 application  topically as needed. 30 g 6  . losartan (COZAAR) 50 MG tablet TAKE 1 TABLET (50 MG TOTAL) BY MOUTH DAILY. 90 tablet 1  . morphine (MS CONTIN) 100 MG 12 hr tablet Take 100 mg by mouth every 8 (eight) hours.     . Oxycodone HCl 20 MG TABS     . pomalidomide (POMALYST) 2 MG capsule Take 1 capsule (2 mg total) by mouth daily. Take with water on days 1-21. Repeat every 28 days. 21 capsule 0  . prochlorperazine (COMPAZINE) 10 MG tablet Take 1 tablet (10 mg total) by mouth every 6 (six) hours as needed. for nausea 90 tablet 3  . Respiratory Therapy Supplies (FLUTTER) DEVI Use as indicated 1 each 0  . Vitamin D, Ergocalciferol, (DRISDOL) 50000 units CAPS capsule Take 1 capsule (50,000 Units total) by mouth every 7 (seven) days. 12 capsule 3   No current facility-administered medications for this visit.    Facility-Administered Medications Ordered in Other Visits  Medication Dose Route Frequency Provider Last Rate Last Dose  . heparin lock flush 100 unit/mL  500 Units Intracatheter Once PRN Heath Lark, MD      . sodium chloride 0.9 % injection 10 mL  10 mL Intravenous PRN Heath Lark, MD   10 mL at 02/19/16 0911  . sodium chloride 0.9 % injection 10 mL  10 mL Intravenous PRN Heath Lark, MD   10 mL at 12/24/16 0926  . sodium chloride flush (NS) 0.9 % injection 10 mL  10 mL Intracatheter PRN Heath Lark, MD        PHYSICAL EXAMINATION: ECOG  PERFORMANCE STATUS: 2 - Symptomatic, <50% confined to bed  Vitals:   12/24/16 0945  BP: (!) 154/86  Pulse: 69  Resp: 17  Temp: 98.3 F (36.8 C)   Filed Weights   12/24/16 0945  Weight: 153 lb 3.2 oz (69.5 kg)    GENERAL:alert, no distress and comfortable.  She appears cushingoid SKIN: skin color, texture, turgor are normal, no rashes or significant lesions EYES: normal, Conjunctiva are pink and non-injected, sclera clear OROPHARYNX:no exudate, no erythema and lips, buccal mucosa, and tongue normal  NECK: supple, thyroid normal size, non-tender, without nodularity LYMPH:  no palpable lymphadenopathy in the cervical, axillary or inguinal LUNGS: clear to auscultation and percussion with normal breathing effort HEART: regular rate & rhythm and no murmurs and no lower extremity edema ABDOMEN:abdomen soft, non-tender and normal bowel sounds Musculoskeletal:no cyanosis of digits and no clubbing  NEURO: alert & oriented x 3 with fluent speech, no focal motor/sensory deficits  LABORATORY DATA:  I have reviewed the data as listed    Component Value Date/Time   NA 140 12/24/2016 0917   K 4.0 12/24/2016 0917   CL 107 09/11/2015 2255   CL 107 03/10/2013 1014   CO2 23 12/24/2016 0917   GLUCOSE 163 (H) 12/24/2016 0917   GLUCOSE 111 (H) 03/10/2013 1014   BUN 15.4 12/24/2016 0917   CREATININE 0.8 12/24/2016 0917   CALCIUM 9.0 12/24/2016 0917   PROT 5.4 (L) 12/24/2016 0917   ALBUMIN 3.2 (L) 12/24/2016 0917   AST 107 (H) 12/24/2016 0917   ALT 126 (H) 12/24/2016 0917   ALKPHOS 164 (H) 12/24/2016 0917   BILITOT 1.26 (H) 12/24/2016 0917   GFRNONAA >60 09/12/2015 0620   GFRAA >60 09/12/2015 0620    No results found for: SPEP, UPEP  Lab Results  Component Value Date   WBC 5.4 12/24/2016   NEUTROABS 4.6 12/24/2016   HGB  14.8 12/24/2016   HCT 44.8 12/24/2016   MCV 98.1 12/24/2016   PLT 104 (L) 12/24/2016      Chemistry      Component Value Date/Time   NA 140 12/24/2016 0917    K 4.0 12/24/2016 0917   CL 107 09/11/2015 2255   CL 107 03/10/2013 1014   CO2 23 12/24/2016 0917   BUN 15.4 12/24/2016 0917   CREATININE 0.8 12/24/2016 0917      Component Value Date/Time   CALCIUM 9.0 12/24/2016 0917   ALKPHOS 164 (H) 12/24/2016 0917   AST 107 (H) 12/24/2016 0917   ALT 126 (H) 12/24/2016 0917   BILITOT 1.26 (H) 12/24/2016 2130       RADIOGRAPHIC STUDIES: I have personally reviewed the radiological images as listed and agreed with the findings in the report. US Abdomen Limited Ruq  Result Date: 12/03/2016 CLINICAL DATA:  Abnormal liver function studies. History of hepatitis-C, multiple myeloma. EXAM: US ABDOMEN LIMITED - RIGHT UPPER QUADRANT COMPARISON:  None in PACs FINDINGS: Gallbladder: No gallstones or wall thickening visualized. No sonographic Murphy sign noted by sonographer. Common bile duct: Diameter: 3.8 mm Liver: The hepatic echotexture is normal. There is a septated cystic focus in the right hepatic lobe measuring 8 x 8 x 7 mm. A subtle hypoechoic focus was noted in the right hepatic lobe on the CT images from a PET-CT study of July 19, 2014. There is no intrahepatic ductal dilation. IMPRESSION: No acute abnormality of the gallbladder, common bile duct, or liver. subcentimeter septated focus in the right hepatic lobe is likely stable when compared to the CT images from a PET-CT study of July 19, 2014. Electronically Signed   By: David  Martinique M.D.   On: 12/03/2016 08:45    ASSESSMENT & PLAN:  Multiple myeloma in remission ([place]) Her recent myeloma showed stable disease The additional dexamethasone was helpful I will proceed with Daratumumab now She will continue calcium with vitamin D supplement. She has received recent Zometa, next due in April 2018. She has resumed treatment with Pomalyst recently but unfortunately developed acute elevated LFTs. I recommend holding Pomalyst for now.  Recent ultrasound excluded liver pathology as a cause of LFTs  abnormality  Bilateral low back pain without sciatica She has recent acute on chronic pain.  I plan to order additional x-ray for further evaluation  Cancer associated pain She has acute on chronic pain. I will order additional x-ray for further evaluation.  Myeloma panel is pending.  It is likely due to disease versus osteonecrosis of the hip as possible cause of her pain  Cigarette smoker Again, I spent some time explaining to her the importance of nicotine cessation.  Elevated liver enzymes She has elevated liver enzymes of unknown etiology. Ultrasound excluded liver pathology I will proceed with treatment today but to hold Pomalyst I will recheck hepatitis C status to make sure this is not due to reactivation of hepatitis C  Essential hypertension she will continue current medical management. I recommend close follow-up with primary care doctor for medication adjustment.   Cushingoid side effect of steroids (HCC) She has cushingoid features from recent steroids. I plan to reduce the steroid to 2 mg daily pending further workup   Orders Placed This Encounter  Procedures  . DG Hip Unilat W or W/O Pelvis 1 View Left    Standing Status:   Future    Standing Expiration Date:   12/24/2017    Order Specific Question:   Reason for Exam (  SYMPTOM  OR DIAGNOSIS REQUIRED)    Answer:   severe left hip pain, myeloma    Order Specific Question:   Preferred imaging location?    Answer:   [place] [facility] 1V LEFT    Standing Status:   Future    Standing Expiration Date:   02/21/2018    Order Specific Question:   Reason for Exam (SYMPTOM  OR DIAGNOSIS REQUIRED)    Answer:   left leg pain    Order Specific Question:   Preferred imaging location?    Answer:   [facility]  . Comprehensive metabolic panel    Standing Status:   Future    Standing Expiration Date:   01/28/2018  . CBC with Differential/Platelet    Standing Status:   Future    Standing Expiration  Date:   01/28/2018  . Kappa/lambda light chains    Standing Status:   Future    Standing Expiration Date:   01/28/2018  . Multiple Myeloma Panel (SPEP&IFE w/QIG)    Standing Status:   Future    Standing Expiration Date:   01/28/2018   All questions were answered. The patient knows to call the clinic with any problems, questions or concerns. No barriers to learning was detected. I spent 25 minutes counseling the patient face to face. The total time spent in the appointment was 40 minutes and more than 50% was on counseling and review of test results     Heath Lark, MD 12/24/2016 2:14 PM

## 2016-12-24 NOTE — Progress Notes (Signed)
Noted increase in BP. Patient currently asymptomatic. States she took her antihypertensives this morning. According to patient, PMD recently adjusted BP meds. She has appt for recheck next week. Dr. Alvy Bimler aware. States no intervention needed unless patient becomes symptomatic. Patient reminded of radiology appt this evening. Verbalizes understanding.

## 2016-12-24 NOTE — Telephone Encounter (Signed)
Per Dr Alvy Bimler , I left message that Pomalyst is stopped temporarily for approx 1 week .

## 2016-12-24 NOTE — Patient Instructions (Signed)
[facility] Discharge Instructions for Patients Receiving Chemotherapy  Today you received the following chemotherapy agents Darzalex.  To help prevent nausea and vomiting after your treatment, we encourage you to take your nausea medication as directed.  If you develop nausea and vomiting that is not controlled by your nausea medication, call the clinic.   BELOW ARE SYMPTOMS THAT SHOULD BE REPORTED IMMEDIATELY:  *FEVER GREATER THAN 100.5 F  *CHILLS WITH OR WITHOUT FEVER  NAUSEA AND VOMITING THAT IS NOT CONTROLLED WITH YOUR NAUSEA MEDICATION  *UNUSUAL SHORTNESS OF BREATH  *UNUSUAL BRUISING OR BLEEDING  TENDERNESS IN MOUTH AND THROAT WITH OR WITHOUT PRESENCE OF ULCERS  *URINARY PROBLEMS  *BOWEL PROBLEMS  UNUSUAL RASH Items with * indicate a potential emergency and should be followed up as soon as possible.  Feel free to call the clinic you have any questions or concerns. The clinic phone number is [phone].  Please show the CHEMO ALERT CARD at check-in to the Emergency Department and triage nurse.    

## 2016-12-24 NOTE — Telephone Encounter (Signed)
Per 2/22 LOS and staff message I scheduled appts. Gave calendar and notified the scheduler

## 2016-12-24 NOTE — Assessment & Plan Note (Signed)
She has recent acute on chronic pain.  I plan to order additional x-ray for further evaluation

## 2016-12-24 NOTE — Assessment & Plan Note (Signed)
she will continue current medical management. I recommend close follow-up with primary care doctor for medication adjustment.  

## 2016-12-24 NOTE — Assessment & Plan Note (Signed)
She has elevated liver enzymes of unknown etiology. Ultrasound excluded liver pathology I will proceed with treatment today but to hold Pomalyst I will recheck hepatitis C status to make sure this is not due to reactivation of hepatitis C

## 2016-12-24 NOTE — Assessment & Plan Note (Signed)
She has acute on chronic pain. I will order additional x-ray for further evaluation.  Myeloma panel is pending.  It is likely due to disease versus osteonecrosis of the hip as possible cause of her pain

## 2016-12-24 NOTE — Assessment & Plan Note (Signed)
Her recent myeloma showed stable disease The additional dexamethasone was helpful I will proceed with Daratumumab now She will continue calcium with vitamin D supplement. She has received recent Zometa, next due in April 2018. She has resumed treatment with Pomalyst recently but unfortunately developed acute elevated LFTs. I recommend holding Pomalyst for now.  Recent ultrasound excluded liver pathology as a cause of LFTs abnormality

## 2016-12-24 NOTE — Assessment & Plan Note (Signed)
Again, I spent some time explaining to her the importance of nicotine cessation.

## 2016-12-24 NOTE — Progress Notes (Signed)
Requested auth for Pomalyst and it was approved from 10/31/16 to 11/01/17.

## 2016-12-24 NOTE — Telephone Encounter (Signed)
Message sent to chemo scheduler to be added. Appointments scheduled per 12/24/16 los. Patient was given a copy of the AVS report and appointment schedule per 12/24/16 los.

## 2016-12-24 NOTE — Patient Instructions (Signed)

## 2016-12-24 NOTE — Assessment & Plan Note (Signed)
She has cushingoid features from recent steroids. I plan to reduce the steroid to 2 mg daily pending further workup

## 2016-12-25 ENCOUNTER — Other Ambulatory Visit: Payer: Self-pay | Admitting: Hematology and Oncology

## 2016-12-25 ENCOUNTER — Telehealth: Payer: Self-pay | Admitting: *Deleted

## 2016-12-25 DIAGNOSIS — R748 Abnormal levels of other serum enzymes: Secondary | ICD-10-CM

## 2016-12-25 DIAGNOSIS — R110 Nausea: Secondary | ICD-10-CM

## 2016-12-25 DIAGNOSIS — B182 Chronic viral hepatitis C: Secondary | ICD-10-CM

## 2016-12-25 LAB — HEPATITIS C ANTIBODY (REFLEX): HCV Ab: >11 s/co ratio — ABNORMAL HIGH (ref 0.0–0.9)

## 2016-12-25 LAB — KAPPA/LAMBDA LIGHT CHAINS
Ig Kappa Free Light Chain: 135.5 mg/L — ABNORMAL HIGH (ref 3.3–19.4)
Ig Lambda Free Light Chain: 3.7 mg/L — ABNORMAL LOW (ref 5.7–26.3)
Kappa/Lambda FluidC Ratio: 36.62 — ABNORMAL HIGH (ref 0.26–1.65)

## 2016-12-25 NOTE — Telephone Encounter (Signed)
Notified of message below. Verbalized understanding 

## 2016-12-25 NOTE — Telephone Encounter (Signed)
-----   Message from Loletta Parish sent at 12/25/2016 12:52 PM EST ----- Hello,  Authorization Approved.  Thanks  ----- Message ----- From: Patton Salles, RN Sent: 12/25/2016  10:09 AM To: Loletta Parish  CT Abdomen ordered  Need ASAP.  Thanks, [employer]

## 2016-12-25 NOTE — Telephone Encounter (Signed)
CT scheduled for Wednesday 2/28 @ 3:00pm.   To arrive at 2:45, NPO 4 hours prior to CT, drink 1 bottle contrast @ 1:00pm, drink 1 bottle at 2:00pm. Verbalized understanding.  Will come in early at 2:00pm to [facility] for labs

## 2016-12-25 NOTE — Telephone Encounter (Signed)
-----   Message from Heath Lark, MD sent at 12/25/2016  7:08 AM EST ----- Regarding: Xray Pls call her; Xray is negative. I am ordering CT abdomen to evaluate her liver enzymes problem. On the same day of CT, she will get repeat labs draw; and I will call her with results. Continue to hold Pomalyst ----- Message ----- From: Interface, Rad Results In Sent: 12/24/2016   4:37 PM To: Heath Lark, MD

## 2016-12-28 ENCOUNTER — Other Ambulatory Visit: Payer: Self-pay | Admitting: Hematology and Oncology

## 2016-12-28 DIAGNOSIS — Z79891 Long term (current) use of opiate analgesic: Secondary | ICD-10-CM | POA: Diagnosis not present

## 2016-12-28 DIAGNOSIS — M545 Low back pain: Secondary | ICD-10-CM | POA: Diagnosis not present

## 2016-12-28 DIAGNOSIS — G894 Chronic pain syndrome: Secondary | ICD-10-CM | POA: Diagnosis not present

## 2016-12-28 DIAGNOSIS — R748 Abnormal levels of other serum enzymes: Secondary | ICD-10-CM

## 2016-12-28 DIAGNOSIS — K5900 Constipation, unspecified: Secondary | ICD-10-CM | POA: Diagnosis not present

## 2016-12-28 DIAGNOSIS — C9001 Multiple myeloma in remission: Secondary | ICD-10-CM

## 2016-12-29 LAB — MULTIPLE MYELOMA PANEL, SERUM
ALBUMIN/GLOB SERPL: 1.4 (ref 0.7–1.7)
ALPHA 1: 0.3 g/dL (ref 0.0–0.4)
ALPHA2 GLOB SERPL ELPH-MCNC: 0.8 g/dL (ref 0.4–1.0)
Albumin SerPl Elph-Mcnc: 3 g/dL (ref 2.9–4.4)
B-GLOBULIN SERPL ELPH-MCNC: 0.9 g/dL (ref 0.7–1.3)
GLOBULIN, TOTAL: 2.3 g/dL (ref 2.2–3.9)
Gamma Glob SerPl Elph-Mcnc: 0.3 g/dL — ABNORMAL LOW (ref 0.4–1.8)
IGG (IMMUNOGLOBIN G), SERUM: 334 mg/dL — AB (ref 700–1600)
IgA, Qn, Serum: 23 mg/dL — ABNORMAL LOW (ref 87–352)
IgM, Qn, Serum: 21 mg/dL — ABNORMAL LOW (ref 26–217)
M PROTEIN SERPL ELPH-MCNC: 0.1 g/dL — AB
Total Protein: 5.3 g/dL — ABNORMAL LOW (ref 6.0–8.5)

## 2016-12-30 ENCOUNTER — Other Ambulatory Visit: Payer: Self-pay | Admitting: Hematology and Oncology

## 2016-12-30 ENCOUNTER — Encounter (HOSPITAL_COMMUNITY): Payer: Self-pay

## 2016-12-30 ENCOUNTER — Ambulatory Visit (HOSPITAL_COMMUNITY)
Admission: RE | Admit: 2016-12-30 | Discharge: 2016-12-30 | Disposition: A | Discharge status: Home / Self Care | Payer: Medicare Other | Source: Ambulatory Visit | Attending: Hematology and Oncology | Admitting: Hematology and Oncology

## 2016-12-30 DIAGNOSIS — K769 Liver disease, unspecified: Secondary | ICD-10-CM | POA: Diagnosis not present

## 2016-12-30 DIAGNOSIS — I700 Atherosclerosis of aorta: Secondary | ICD-10-CM | POA: Insufficient documentation

## 2016-12-30 DIAGNOSIS — B182 Chronic viral hepatitis C: Secondary | ICD-10-CM | POA: Insufficient documentation

## 2016-12-30 DIAGNOSIS — R748 Abnormal levels of other serum enzymes: Secondary | ICD-10-CM | POA: Insufficient documentation

## 2016-12-30 DIAGNOSIS — C9000 Multiple myeloma not having achieved remission: Secondary | ICD-10-CM | POA: Diagnosis not present

## 2016-12-30 DIAGNOSIS — R110 Nausea: Secondary | ICD-10-CM | POA: Insufficient documentation

## 2016-12-30 DIAGNOSIS — B180 Chronic viral hepatitis B with delta-agent: Secondary | ICD-10-CM | POA: Diagnosis not present

## 2016-12-30 MED ORDER — HEPARIN SOD (PORK) LOCK FLUSH 100 UNIT/ML IV SOLN: 500.0000 [IU] | Freq: Once | INTRAVENOUS | Status: DC

## 2016-12-30 MED ORDER — IOPAMIDOL (ISOVUE-300) INJECTION 61%
INTRAVENOUS | Status: AC
  Filled 2016-12-30: qty 100

## 2016-12-30 MED ORDER — IOPAMIDOL (ISOVUE-300) INJECTION 61%
INTRAVENOUS | Status: AC
  Filled 2016-12-30: qty 30

## 2016-12-30 MED ORDER — SODIUM CHLORIDE 0.9 % IV SOLN
Freq: Once | INTRAVENOUS | Status: DC
  Filled 2016-12-30: qty 0.5

## 2016-12-30 MED ORDER — HEPARIN SOD (PORK) LOCK FLUSH 100 UNIT/ML IV SOLN
INTRAVENOUS | Status: AC
  Administered 2016-12-30: 500 [IU]
  Filled 2016-12-30: qty 5

## 2016-12-30 MED ORDER — IOPAMIDOL (ISOVUE-300) INJECTION 61%
30.0000 mL | Freq: Once | INTRAVENOUS | Status: DC | PRN
  Administered 2016-12-30: 30 mL via ORAL
  Filled 2016-12-30: qty 30

## 2016-12-30 MED ORDER — IOPAMIDOL (ISOVUE-300) INJECTION 61%
100.0000 mL | Freq: Once | INTRAVENOUS | Status: AC | PRN
  Administered 2016-12-30: 100 mL via INTRAVENOUS

## 2016-12-31 ENCOUNTER — Other Ambulatory Visit: Payer: Self-pay | Admitting: Family Medicine

## 2016-12-31 ENCOUNTER — Encounter: Payer: Self-pay | Admitting: Family Medicine

## 2016-12-31 ENCOUNTER — Ambulatory Visit: Payer: Medicare Other

## 2016-12-31 ENCOUNTER — Ambulatory Visit (INDEPENDENT_AMBULATORY_CARE_PROVIDER_SITE_OTHER): Payer: Medicare Other | Admitting: Family Medicine

## 2016-12-31 ENCOUNTER — Ambulatory Visit (HOSPITAL_BASED_OUTPATIENT_CLINIC_OR_DEPARTMENT_OTHER): Payer: Medicare Other

## 2016-12-31 ENCOUNTER — Telehealth: Payer: Self-pay

## 2016-12-31 VITALS — BP 140/100 | HR 84 | Temp 98.7°F | Ht 64.0 in | Wt 152.3 lb

## 2016-12-31 DIAGNOSIS — E039 Hypothyroidism, unspecified: Secondary | ICD-10-CM

## 2016-12-31 DIAGNOSIS — C9001 Multiple myeloma in remission: Secondary | ICD-10-CM

## 2016-12-31 DIAGNOSIS — Z452 Encounter for adjustment and management of vascular access device: Secondary | ICD-10-CM

## 2016-12-31 DIAGNOSIS — I10 Essential (primary) hypertension: Secondary | ICD-10-CM | POA: Diagnosis not present

## 2016-12-31 DIAGNOSIS — B182 Chronic viral hepatitis C: Secondary | ICD-10-CM

## 2016-12-31 DIAGNOSIS — R748 Abnormal levels of other serum enzymes: Secondary | ICD-10-CM | POA: Diagnosis not present

## 2016-12-31 DIAGNOSIS — Z0000 Encounter for general adult medical examination without abnormal findings: Secondary | ICD-10-CM | POA: Diagnosis not present

## 2016-12-31 DIAGNOSIS — C9002 Multiple myeloma in relapse: Secondary | ICD-10-CM

## 2016-12-31 LAB — TSH: TSH: 2.03 u[IU]/mL (ref <=5.90)

## 2016-12-31 MED ORDER — SODIUM CHLORIDE 0.9 % IJ SOLN
10.0000 mL | INTRAMUSCULAR | Status: DC | PRN
  Administered 2016-12-31: 10 mL via INTRAVENOUS
  Filled 2016-12-31: qty 10

## 2016-12-31 MED ORDER — HEPARIN SOD (PORK) LOCK FLUSH 100 UNIT/ML IV SOLN
500.0000 [IU] | Freq: Once | INTRAVENOUS | Status: AC | PRN
  Administered 2016-12-31: 500 [IU] via INTRAVENOUS
  Filled 2016-12-31: qty 5

## 2016-12-31 NOTE — Telephone Encounter (Signed)
-----   Message from Heath Lark, MD sent at 12/30/2016  4:43 PM EST ----- Regarding: CT Liver CT looks OK I have placed urgent lab message to add lab appt around 12 pm tomorrow ----- Message ----- From: Interface, Rad Results In Sent: 12/30/2016   4:24 PM To: Heath Lark, MD

## 2016-12-31 NOTE — Patient Instructions (Signed)
Implanted Port Home Guide An implanted port is a type of central line that is placed under the skin. Central lines are used to provide IV access when treatment or nutrition needs to be given through a person's veins. Implanted ports are used for long-term IV access. An implanted port may be placed because:  You need IV medicine that would be irritating to the small veins in your hands or arms.  You need long-term IV medicines, such as antibiotics.  You need IV nutrition for a long period.  You need frequent blood draws for lab tests.  You need dialysis.  Implanted ports are usually placed in the chest area, but they can also be placed in the upper arm, the abdomen, or the leg. An implanted port has two main parts:  Reservoir. The reservoir is round and will appear as a small, raised area under your skin. The reservoir is the part where a needle is inserted to give medicines or draw blood.  Catheter. The catheter is a thin, flexible tube that extends from the reservoir. The catheter is placed into a large vein. Medicine that is inserted into the reservoir goes into the catheter and then into the vein.  How will I care for my incision site? Do not get the incision site wet. Bathe or shower as directed by your health care provider. How is my port accessed? Special steps must be taken to access the port:  Before the port is accessed, a numbing cream can be placed on the skin. This helps numb the skin over the port site.  Your health care provider uses a sterile technique to access the port. ? Your health care provider must put on a mask and sterile gloves. ? The skin over your port is cleaned carefully with an antiseptic and allowed to dry. ? The port is gently pinched between sterile gloves, and a needle is inserted into the port.  Only "non-coring" port needles should be used to access the port. Once the port is accessed, a blood return should be checked. This helps ensure that the port  is in the vein and is not clogged.  If your port needs to remain accessed for a constant infusion, a clear (transparent) bandage will be placed over the needle site. The bandage and needle will need to be changed every week, or as directed by your health care provider.  Keep the bandage covering the needle clean and dry. Do not get it wet. Follow your health care provider's instructions on how to take a shower or bath while the port is accessed.  If your port does not need to stay accessed, no bandage is needed over the port.  What is flushing? Flushing helps keep the port from getting clogged. Follow your health care provider's instructions on how and when to flush the port. Ports are usually flushed with saline solution or a medicine called heparin. The need for flushing will depend on how the port is used.  If the port is used for intermittent medicines or blood draws, the port will need to be flushed: ? After medicines have been given. ? After blood has been drawn. ? As part of routine maintenance.  If a constant infusion is running, the port may not need to be flushed.  How long will my port stay implanted? The port can stay in for as long as your health care provider thinks it is needed. When it is time for the port to come out, surgery will be   done to remove it. The procedure is similar to the one performed when the port was put in. When should I seek immediate medical care? When you have an implanted port, you should seek immediate medical care if:  You notice a bad smell coming from the incision site.  You have swelling, redness, or drainage at the incision site.  You have more swelling or pain at the port site or the surrounding area.  You have a fever that is not controlled with medicine.  This information is not intended to replace advice given to you by your health care provider. Make sure you discuss any questions you have with your health care provider. Document  Released: 10/19/2005 Document Revised: 03/26/2016 Document Reviewed: 06/26/2013 Elsevier Interactive Patient Education  2017 Elsevier Inc.  

## 2016-12-31 NOTE — Assessment & Plan Note (Signed)
S: compliant with levothyroxine 14mcg but last check 2016 A/P: asked patient to have labs drawn here today but going to oncology later for labs- ordered future tsh lab with hopes they can collect there.

## 2016-12-31 NOTE — Progress Notes (Signed)
Subjective:  Kendra Johns is a 66 y.o. year old very pleasant female patient who presents for/with See problem oriented charting ROS- chronic right leg pain after fracture, no chest pain or shortness of breath, has gained weight but has been on steroids   Past Medical History-  Patient Active Problem List   Diagnosis Date Noted  . COPD GOLD I 09/12/2015    Priority: High  . Right ischial fracture ([place]) 09/06/2015    Priority: High  . 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 Cigarette smoker 08/22/2015    Priority: High  . Status post bone marrow transplant ([place]) 08/21/2009    Priority: High  . Multiple myeloma in remission ([place]) 09/14/2007    Priority: High  . Hypothyroidism 08/21/2015    Priority: Medium  . Thrombocytopenia ([place]) 10/10/2014    Priority: Medium  . 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 Hyperlipidemia 02/13/2011    Priority: Medium  . Essential hypertension 03/11/2010    Priority: Medium  . Chronic hepatitis C ([place]) 08/31/2006    Priority: Medium  . Vitamin D deficiency 05/28/2015    Priority: Low  . Chronic fatigue 03/07/2015    Priority: Low  . Poor dentition 01/23/2015    Priority: Low  . Gastritis 08/30/2014    Priority: Low  . Cough 08/02/2014    Priority: Low  . Bilateral low back pain without sciatica 03/17/2011    Priority: Low  . History of adenomatous colon polyps 08/31/2006    Priority: Low  . 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 Elevated liver enzymes 11/26/2016  . Bilateral leg pain 05/13/2016  . Oral infection 02/19/2016  . Pressure sore on heel 02/19/2016  . Occipital neuralgia of right side 01/02/2016  . Drug-induced neutropenia ([name]) 12/17/2015  . Encounter for antineoplastic chemotherapy 11/27/2015  . Acute intractable headache 11/14/2015  . Neck pain, bilateral posterior 11/05/2015  . Dysuria 10/08/2015  . Anemia 09/05/2015   . Cancer associated pain 08/21/2015  . Nausea without vomiting 01/22/2014    Medications- reviewed and updated Current Outpatient Prescriptions  Medication Sig Dispense Refill  . acyclovir (ZOVIRAX) 400 MG tablet TAKE 1 TABLET BY MOUTH EVERY DAY 30 tablet 2  . albuterol (PROAIR HFA) 108 (90 Base) MCG/ACT inhaler 2 puffs every 4 hours as needed only  if your can't catch your breath 1 Inhaler 0  . ALPRAZolam (XANAX) 0.25 MG tablet Take 1 tablet (0.25 mg total) by mouth at bedtime as needed for anxiety. 60 tablet 0  . aspirin 81 MG tablet Take 81 mg by mouth daily.    . budesonide-formoterol (SYMBICORT) 160-4.5 MCG/ACT inhaler Take 2 puffs first thing in am and then another 2 puffs about 12 hours later. 1 Inhaler 5  . cloNIDine (CATAPRES) 0.1 MG tablet Take 1 tablet (0.1 mg total) by mouth 2 (two) times daily. 60 tablet 11  . dexamethasone (DECADRON) 4 MG tablet Take 1 tablet (4 mg total) by mouth daily. 60 tablet 1  . levothyroxine (SYNTHROID, LEVOTHROID) 50 MCG tablet TAKE 1 TABLET BY MOUTH EVERY DAY BEFORE BREAKFAST 90 tablet 1  . lidocaine-prilocaine (EMLA) cream Apply 1 application topically as needed. 30 g 6  . morphine (MS CONTIN) 100 MG 12 hr tablet Take 100 mg by mouth every 8 (eight) hours.     . Oxycodone HCl 20 MG TABS     . prochlorperazine (COMPAZINE) 10  MG tablet Take 1 tablet (10 mg total) by mouth every 6 (six) hours as needed. for nausea 90 tablet 3  . Respiratory Therapy Supplies (FLUTTER) DEVI Use as indicated 1 each 0  . Vitamin D, Ergocalciferol, (DRISDOL) 50000 units CAPS capsule Take 1 capsule (50,000 Units total) by mouth every 7 (seven) days. 12 capsule 3  . gabapentin (NEURONTIN) 600 MG tablet Take 1 tablet (600 mg total) by mouth 3 (three) times daily. (Patient not taking: Reported on 12/31/2016) 90 tablet 3  . losartan (COZAAR) 50 MG tablet TAKE 1 TABLET (50 MG TOTAL) BY MOUTH DAILY. (Patient not taking: Reported on 12/31/2016) 90 tablet 1  . pomalidomide (POMALYST) 2  MG capsule Take 1 capsule (2 mg total) by mouth daily. Take with water on days 1-21. Repeat every 28 days. 21 capsule 0   No current facility-administered medications for this visit.    Facility-Administered Medications Ordered in Other Visits  Medication Dose Route Frequency Provider Last Rate Last Dose  . sodium chloride 0.9 % injection 10 mL  10 mL Intravenous PRN Heath Lark, MD   10 mL at 02/19/16 0911  . sodium chloride 0.9 % injection 10 mL  10 mL Intravenous PRN Heath Lark, MD   10 mL at 12/24/16 0926    Objective: BP (!) 140/100   Pulse 84   Temp 98.7 F (37.1 C) (Oral)   Ht _0  (1.626 m)   Wt 152 lb 5 oz (69.1 kg)   SpO2 94%   BMI 26.14 kg/m  Gen: NAD, resting comfortably, facial fullness noted CV: RRR no murmurs rubs or gallops Lungs: CTAB no crackles, wheeze, rhonchi Ext: no edema Skin: warm, dry  Assessment/Plan:  Essential hypertension S: controlled poorly on Clonidine 0.55m BID. She is not taking former Losartan 524mas she was not clear she should be taking 2 different medicines BP Readings from Last 3 Encounters:  12/31/16 (!) 140/100  12/24/16 (!) 178/86  12/24/16 (!) 154/86  A/P:Continue clonidine 0.73m573mID but add losartan back in and follow up 1 month   Hypothyroidism S: compliant with levothyroxine 59m102mut last check 2016 A/P: asked patient to have labs drawn here today but going to oncology later for labs- ordered future tsh lab with hopes they can collect there.   1 month  Orders Placed This Encounter  Procedures  . TSH    Standing Status:   Future    Standing Expiration Date:   12/31/2017   Return precautions advised.  StepGarret Reddish

## 2016-12-31 NOTE — Assessment & Plan Note (Signed)
S: controlled poorly on Clonidine 0.1mg  BID. She is not taking former Losartan 50mg  as she was not clear she should be taking 2 different medicines BP Readings from Last 3 Encounters:  12/31/16 (!) 140/100  12/24/16 (!) 178/86  12/24/16 (!) 154/86  A/P:Continue clonidine 0.1mg  BID but add losartan back in and follow up 1 month

## 2016-12-31 NOTE — Patient Instructions (Addendum)
I have ordered a thyroid blood test (TSH). Please ask the cancer center if they can include that in their labstoday.   Restart losartan 50mg  in the monring. Follow up with me in 1 month to recheck.    Kendra Johns , Thank you for taking time to come for your Medicare Wellness Visit. I appreciate your ongoing commitment to your health goals. Please review the following plan we discussed and let me know if I can assist you in the future.     [place] [facility]; [phone] Sr. [place]; [phone] Get resource to get information on any and all community programs for Seniors  [place]: [phone] Community Health Response Program -0000000 Public Health Dept; Need to be a skilled visit but can assist with bathing as well; [phone]  Adult center for Enrichment;  Call Senior Line; [phone]  Have social worker that you can speak with  Adult day services include Adult Day Care, Adult Day Healthcare, Group Respite, Care Partners, Psychologist, occupational In [employer], Education and Support Program  Dept of Social Services; Call [phone] and ask for SW on call  Options for Medicaid include the Community Alternatives program; [facility] (personal care services) or PACE program, which is a medical and social program combined  Braulio Conte manages the community Alternatives program at the [employer]; 0000000 (this is a program with a waiting list but provides SNF care at home;  [facility] 2 required; Call Claiborne Billings and she will send out packet of information   Caregiver support group and information regarding [place] is at the; [facility] Address: [address] [place], [place] [address]  Phone: [phone]   [employer] general resources for food etc      This is a list of the screening recommended for you and due dates:  Health Maintenance  Topic Date Due  . Mammogram  12/31/2012  . DEXA scan (bone density measurement)   04/24/2015  . Pneumonia vaccines (1 of 2 - PCV13) 04/24/2015  . Tetanus Vaccine  02/12/2020  . Flu Shot  Completed  .  Hepatitis C: One time screening is recommended by Center for Disease Control  (CDC) for  adults born from [birth year] through 1966.   Completed      These are the goals we discussed: Goals    . Quit smoking / using tobacco          Smoking;  Educated to avoid secondary smoke Smoking cessation in [place]: [place] at [phone] -day Smoking cessation in [place]: Evening; [phone] Smoking cessation at [facility]: [id number] Smoking cessation at [facility]: [phone] Classes offered a couple of times a month;  Will work with the patient as far as registration and location  Meds may help; chatix (Varenicline); Zyban (Bupropion SR); Nicotine Replacement (gum; lozenges; patches; etc.)   30 pack yr smoking hx: Educated regarding LDCT; To discuss with MD at next fup. Also educated on AAA screening for men 65-75 who have smoked   [place] has a quit  Line -1-800-QUIT-NOW 989-328-4582).          This is a list of the screening recommended for you and due dates:  Health Maintenance  Topic Date Due  . Mammogram  12/31/2012  . DEXA scan (bone density measurement)  04/24/2015  . Pneumonia vaccines (1 of 2 - PCV13) 04/24/2015  . Tetanus Vaccine  02/12/2020  . Flu Shot  Completed  .  Hepatitis C: One time screening is recommended by Center for Disease Control  (CDC)  for  adults born from [birth year] through 1966.   Completed

## 2016-12-31 NOTE — Progress Notes (Signed)
Pre visit review using our clinic review tool, if applicable. No additional management support is needed unless otherwise documented below in the visit note. 

## 2016-12-31 NOTE — Telephone Encounter (Signed)
Called patient with below message, verbalized understanding. 

## 2016-12-31 NOTE — Progress Notes (Signed)
 Subjective:   Kendra Johns is a 65 y.o. female who presents for an Initial Medicare Annual Wellness Visit.   The Patient was informed that the wellness visit is to identify future health risk and educate and initiate measures that can reduce risk for increased disease through the lifespan.    Medicare Wellness Visit with Kendra Johns OV with Dr. Hunter today  Describes health as good, fair or great? 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  No   RISK FACTORS Diet Eats well Spouse and family brings food in Breakfast; sometimes Lunch; grits eggs; sausage Cookies and cakes Vegetables does not eat  Fruit   Regular exercise / taking are of ADLS Does good with morning routine Does her shower good; gets in tub w shower; has grab bars   Fall risk  Given education on "Fall Prevention in the Home" for more safety tips the patient can apply as appropriate.  Long term goal is to "age in place" or undecided   Mobility of Functional changes this year? Safety; community, wears sunscreen, safe place for firearms; Motor vehicle accidents;   Mental Health:  Any emotional problems? Anxious, depressed, irritable, sad or blue?  Denies feeling depressed or hopeless; voices pleasure in daily life How many social activities have you been engaged in within the last 2 weeks? Who would help you with chores; illness; shopping other?  Eye exam does have glaucoma  Not bad enough for treatment      Activities of Daily Living - See functional screen   Cognitive testing; Ad8 score; 0 or less than 2  MMSE deferred or completed if AD8 + 2 issues  Advanced Directives will complete next week   List the name of Physicians or other Practitioners you currently use:   Mammogram; most likely defer Dexa; defer PCV 13 defer to Dr. Hunter   Cardiac Risk Factors include: dyslipidemia    Objective:    Today's Vitals   12/31/16 1031  BP: (!) 140/100  Pulse: 84  Temp: 98.7 F (37.1 C)  TempSrc: Oral  SpO2: 94%  Weight: 152 lb 5 oz (69.1 kg)  Height: 5' 4" (1.626 m)   Body mass index is 26.14 kg/m.   Current Medications (verified) Outpatient Encounter Prescriptions as of 12/31/2016  Medication Sig  . acyclovir (ZOVIRAX) 400 MG tablet TAKE 1 TABLET BY MOUTH EVERY DAY  . albuterol (PROAIR HFA) 108 (90 Base) MCG/ACT inhaler 2 puffs every 4 hours as needed only  if your can't catch your breath  . ALPRAZolam (XANAX) 0.25 MG tablet Take 1 tablet (0.25 mg total) by mouth at bedtime as needed for anxiety.  . aspirin 81 MG tablet Take 81 mg by mouth daily.  . budesonide-formoterol (SYMBICORT) 160-4.5 MCG/ACT inhaler Take 2 puffs first thing in am and then another 2 puffs about 12 hours later.  . cloNIDine (CATAPRES) 0.1 MG tablet Take 1 tablet (0.1 mg total) by mouth 2 (two) times daily.  .   dexamethasone (DECADRON) 4 MG tablet Take 1 tablet (4 mg total) by mouth daily.  . levothyroxine (SYNTHROID, LEVOTHROID) 50 MCG tablet TAKE 1 TABLET BY MOUTH EVERY DAY BEFORE BREAKFAST  . lidocaine-prilocaine (EMLA) cream Apply 1 application topically as needed.  . morphine (Kendra CONTIN) 100 MG 12 hr tablet Take 100 mg by mouth every 8 (eight) hours.   . Oxycodone HCl 20 MG TABS   . prochlorperazine (COMPAZINE) 10 MG tablet Take 1 tablet (10 mg total) by mouth every 6 (six) hours as needed. for nausea  . Respiratory Therapy Supplies (FLUTTER) DEVI Use as indicated  . Vitamin D, Ergocalciferol,  (DRISDOL) 50000 units CAPS capsule Take 1 capsule (50,000 Units total) by mouth every 7 (seven) days.  . gabapentin (NEURONTIN) 600 MG tablet Take 1 tablet (600 mg total) by mouth 3 (three) times daily. (Patient not taking: Reported on 12/31/2016)  . losartan (COZAAR) 50 MG tablet TAKE 1 TABLET (50 MG TOTAL) BY MOUTH DAILY. (Patient not taking: Reported on 12/31/2016)  . pomalidomide (POMALYST) 2 MG capsule Take 1 capsule (2 mg total) by mouth daily. Take with water on days 1-21. Repeat every 28 days.   Facility-Administered Encounter Medications as of 12/31/2016  Medication  . sodium chloride 0.9 % injection 10 mL  . sodium chloride 0.9 % injection 10 mL    Allergies (verified) Codeine and Ibuprofen   History: Past Medical History:  Diagnosis Date  . Acute intractable headache 11/14/2015  . Allergy   . Anxiety   . Chronic fatigue 03/07/2015  . Chronic low back pain   . Cough 08/02/2014  . Dehydration 11/28/2013  . Depression   . DEPRESSION 08/31/2006   Qualifier: Diagnosis of  By: Swords MD, [name]    . Diverticulosis of colon   . DIVERTICULOSIS, COLON 08/31/2006  . Dysuria 10/08/2015  . External hemorrhoids   . Fever 11/28/2013  . GERD with stricture   . Helicobacter pylori gastritis 2001   ? if treated  . Hepatitis B virus infection   . Hepatitis C    genotype 1  . Hx of adenomatous colonic polyps 2007  . Hyperlipidemia   . Hypertension   . IBS (irritable bowel syndrome)   . Multiple myeloma    kappa light chain, s/p high-dose chemo/stem cell tx - [facility]  . Muscle cramp 11/21/2014  . Nausea alone 01/22/2014  . Oral infection 02/19/2016  . Osteoarthritis   . S/P radiation therapy 06/05/15-06/18/15   C7-T1   . Sciatic nerve pain    with Dr. Jeff Jenkins  . Sleep apnea   . Unspecified vitamin D deficiency 09/22/2013  . Vitamin D deficiency 05/28/2015   Past Surgical History:  Procedure Laterality Date  . ABDOMINAL HYSTERECTOMY    . APPENDECTOMY    . BONE MARROW TRANSPLANT      2009, 2013 [facility]  . CHOLECYSTECTOMY    . COLONOSCOPY W/ POLYPECTOMY  08/2006   1-2 adenomas, 6 polyps total, diverticulosis and external hemorrhoids  . DILATION AND CURETTAGE OF UTERUS    . FEMUR IM NAIL Right 08/22/2015   Procedure: INTRAMEDULLARY (IM) NAIL FEMORAL;  Surgeon: Timothy D Murphy, MD;  Location: [facility] OR;  Service: Orthopedics;  Laterality: Right;  . OOPHORECTOMY    . TONSILLECTOMY AND ADENOIDECTOMY    . UPPER GASTROINTESTINAL ENDOSCOPY  08/2003   esophageal stricture dilation, hiatal hernia, gastrritis   Family History  Problem Relation Age of Onset  . Lung cancer Father     smoked  . Leukemia Mother   .   Alcohol abuse    . Cirrhosis Sister   . Lung cancer Sister     smoked  . Colon cancer Neg Hx   . Neuropathy Neg Hx    Social History   Occupational History  . Security    Social History Main Topics  . Smoking status: Current Every Day Smoker    Packs/day: 0.25    Years: 44.00    Types: Cigarettes  . Smokeless tobacco: Never Used  . Alcohol use No  . Drug use: No  . Sexual activity: No    Tobacco Counseling Ready to quit: Not Answered Counseling given: Not Answered   Activities of Daily Living In your present state of health, do you have any difficulty performing the following activities: 12/31/2016  Hearing? N  Vision? N  Difficulty concentrating or making decisions? N  Walking or climbing stairs? N  Dressing or bathing? N  Doing errands, shopping? N  Preparing Food and eating ? N  Using the Toilet? N  In the past six months, have you accidently leaked urine? N  Do you have problems with loss of bowel control? N  Managing your Medications? N  Managing your Finances? N  Housekeeping or managing your Housekeeping? N  Some recent data might be hidden    Immunizations and Health Maintenance Immunization History  Administered Date(s) Administered  . Influenza Split 09/01/2011, 08/31/2012  . Influenza Whole 08/21/2009, 08/07/2010  .  Influenza,inj,Quad PF,36+ Mos 08/25/2013, 07/10/2014, 08/01/2015, 07/22/2016  . PPD Test 08/26/2015  . Pneumococcal-Unspecified 08/02/2013  . Td 02/11/2010   Health Maintenance Due  Topic Date Due  . MAMMOGRAM  12/31/2012  . DEXA SCAN  04/24/2015  . PNA vac Low Risk Adult (1 of 2 - PCV13) 04/24/2015    Patient Care Team: Marin Olp, MD as PCP - General (Family Medicine) Jeanann Lewandowsky, MD as Consulting Physician (Medical Oncology) Marin Olp, MD as Consulting Physician (Family Medicine) Heath Lark, MD as Consulting Physician (Hematology and Oncology) [name], MD as Consulting Physician (Orthopedic Surgery)  Indicate any recent Medical Services you may have received from other than [facility] providers in the past year (date may be approximate).     Assessment:   This is a routine wellness examination for Kendra Johns.   Hearing/Vision screen No exam data present  Dietary issues and exercise activities discussed: Current Exercise Habits: Home exercise routine, Intensity: Mild, Exercise limited by: Other - see comments (has chronic issues )  Goals    . Quit smoking / using tobacco          Smoking;  Educated to avoid secondary smoke Smoking cessation in [place]: [place] at [phone] -day Smoking cessation in [place]: Evening; [phone] Smoking cessation at [facility]: [phone] Smoking cessation at [facility]: [phone] Classes offered a couple of times a month;  Will work with the patient as far as registration and location  Meds may help; chatix (Varenicline); Zyban (Bupropion SR); Nicotine Replacement (gum; lozenges; patches; etc.)   30 pack yr smoking hx: Educated regarding LDCT; To discuss with MD at next fup. Also educated on AAA screening for men 65-75 who have smoked   [facility] has a quit  Line -1-800-QUIT-NOW [phone]).         Depression Screen PHQ 2/9 Scores 12/31/2016 08/14/2015  PHQ - 2 Score 0 0    Fall Risk Fall  Risk  12/31/2016 08/14/2015 05/31/2015  Falls in the past year? No No No    Cognitive Function: MMSE - Mini Mental  State Exam 12/31/2016  Not completed: (No Data)        Screening Tests Health Maintenance  Topic Date Due  . MAMMOGRAM  12/31/2012  . DEXA SCAN  04/24/2015  . PNA vac Low Risk Adult (1 of 2 - PCV13) 04/24/2015  . TETANUS/TDAP  02/12/2020  . INFLUENZA VACCINE  Completed  . Hepatitis C Screening  Completed      Plan:     During the course of the visit, Kendra Johns was educated and counseled about the following appropriate screening and preventive services:   Vaccines to include Pneumoccal, Influenza, Hepatitis B, Td, Zostavax, HCV  Electrocardiogram  Cardiovascular disease screening  Colorectal cancer screening  Bone density screening  Diabetes screening  Glaucoma screening  Mammography/PAP  Nutrition counseling  Smoking cessation counseling  Patient Instructions (the written plan) were given to the patient.    TMLYY,TKPTW, RN   12/31/2016   I have reviewed and agree with note, evaluation, plan. I will reach out to oncology to see if we can give her prevnar at follow up. Manuela Schwartz and I both encouraged smoking cessation which she is not ready for right now.   [name], MD

## 2017-01-01 LAB — HEPATITIS B SURFACE ANTIGEN: HBsAg Screen: NEGATIVE

## 2017-01-01 LAB — HEPATITIS B CORE ANTIBODY, IGM: HEP B C IGM: NEGATIVE

## 2017-01-01 LAB — HEPATITIS B SURFACE ANTIBODY,QUALITATIVE: Hep B Surface Ab, Qual: NONREACTIVE

## 2017-01-01 NOTE — Progress Notes (Signed)
Dr. Yong Channel,  The treatment she is receiving now for myeloma has caused her to have low immunoglobulin levels. I am not sure if the vaccine is going to be effective. Generally, I will still proceed with flu vaccine based on CDC data stating patient may still benefit from flu vaccine but the date for Prevnar is unclear. Hope this is helpful.

## 2017-01-04 LAB — HCV REALTIME ABBOTT
HCV Log10: 6.347 log10 IU/mL
HCV RNA IU: 2221500 IU/mL

## 2017-01-04 LAB — TSH: TSH: 2.03 u[IU]/mL (ref 0.450–4.500)

## 2017-01-05 ENCOUNTER — Encounter: Payer: Self-pay | Admitting: Family Medicine

## 2017-01-05 ENCOUNTER — Telehealth: Payer: Self-pay

## 2017-01-05 NOTE — Telephone Encounter (Signed)
Patient given below message. She states, that she has never had any treatment for Hep C, that was 25 to 30 years ago. She does not have a GI or ID physician.

## 2017-01-05 NOTE — Telephone Encounter (Signed)
-----   Message from Patton Salles, RN sent at 01/05/2017  8:26 AM EST ----- Regarding: FW: Hep C   ----- Message ----- From: Heath Lark, MD Sent: 01/05/2017   6:30 AM To: Patton Salles, RN Subject: Hep C                                          Pls let her know her hep C level is high, the most likely cause of her abnormal LFT Has she received treatment in the past? Does she has a GI or ID doctor? ----- Message ----- From: Interface, [facility] In [facility] Sent: 01/01/2017   6:39 AM To: Heath Lark, MD

## 2017-01-21 ENCOUNTER — Ambulatory Visit: Payer: Medicare Other

## 2017-01-21 ENCOUNTER — Telehealth: Payer: Self-pay | Admitting: Hematology and Oncology

## 2017-01-21 ENCOUNTER — Telehealth: Payer: Self-pay

## 2017-01-21 ENCOUNTER — Ambulatory Visit (HOSPITAL_BASED_OUTPATIENT_CLINIC_OR_DEPARTMENT_OTHER): Payer: Medicare Other | Admitting: Hematology and Oncology

## 2017-01-21 ENCOUNTER — Other Ambulatory Visit (HOSPITAL_BASED_OUTPATIENT_CLINIC_OR_DEPARTMENT_OTHER): Payer: Medicare Other

## 2017-01-21 DIAGNOSIS — Z66 Do not resuscitate: Secondary | ICD-10-CM | POA: Diagnosis not present

## 2017-01-21 DIAGNOSIS — R748 Abnormal levels of other serum enzymes: Secondary | ICD-10-CM

## 2017-01-21 DIAGNOSIS — C9001 Multiple myeloma in remission: Secondary | ICD-10-CM

## 2017-01-21 DIAGNOSIS — D696 Thrombocytopenia, unspecified: Secondary | ICD-10-CM | POA: Diagnosis not present

## 2017-01-21 DIAGNOSIS — G893 Neoplasm related pain (acute) (chronic): Secondary | ICD-10-CM | POA: Diagnosis not present

## 2017-01-21 DIAGNOSIS — C9002 Multiple myeloma in relapse: Secondary | ICD-10-CM | POA: Diagnosis not present

## 2017-01-21 DIAGNOSIS — B1920 Unspecified viral hepatitis C without hepatic coma: Secondary | ICD-10-CM

## 2017-01-21 DIAGNOSIS — Z7189 Other specified counseling: Secondary | ICD-10-CM | POA: Insufficient documentation

## 2017-01-21 LAB — COMPREHENSIVE METABOLIC PANEL
ALBUMIN: 3.2 g/dL — AB (ref 3.5–5.0)
ALK PHOS: 175 U/L — AB (ref 40–150)
ALT: 112 U/L — AB (ref 0–55)
AST: 126 U/L — AB (ref 5–34)
Anion Gap: 11 mEq/L (ref 3–11)
BILIRUBIN TOTAL: 0.81 mg/dL (ref 0.20–1.20)
BUN: 13.5 mg/dL (ref 7.0–26.0)
CALCIUM: 9.4 mg/dL (ref 8.4–10.4)
CO2: 23 mEq/L (ref 22–29)
CREATININE: 0.8 mg/dL (ref 0.6–1.1)
Chloride: 106 mEq/L (ref 98–109)
EGFR: 86 mL/min/{1.73_m2} — ABNORMAL LOW (ref >90)
Glucose: 134 mg/dl (ref 70–140)
Potassium: 4 mEq/L (ref 3.5–5.1)
Sodium: 140 mEq/L (ref 136–145)
TOTAL PROTEIN: 6 g/dL — AB (ref 6.4–8.3)

## 2017-01-21 LAB — CBC WITH DIFFERENTIAL/PLATELET
BASO%: 1.4 % (ref 0.0–2.0)
Basophils Absolute: 0.1 10*3/uL (ref 0.0–0.1)
EOS%: 2.8 % (ref 0.0–7.0)
Eosinophils Absolute: 0.1 10*3/uL (ref 0.0–0.5)
HCT: 46.5 % (ref 34.8–46.6)
HGB: 15.4 g/dL (ref 11.6–15.9)
LYMPH%: 12.4 % — ABNORMAL LOW (ref 14.0–49.7)
MCH: 33 pg (ref 25.1–34.0)
MCHC: 33.2 g/dL (ref 31.5–36.0)
MCV: 99.4 fL (ref 79.5–101.0)
MONO#: 0.5 10*3/uL (ref 0.1–0.9)
MONO%: 11.4 % (ref 0.0–14.0)
NEUT%: 72 % (ref 38.4–76.8)
NEUTROS ABS: 3.4 10*3/uL (ref 1.5–6.5)
PLATELETS: 105 10*3/uL — AB (ref 145–400)
RBC: 4.68 10*6/uL (ref 3.70–5.45)
RDW: 19.8 % — ABNORMAL HIGH (ref 11.2–14.5)
WBC: 4.7 10*3/uL (ref 3.9–10.3)
lymph#: 0.6 10*3/uL — ABNORMAL LOW (ref 0.9–3.3)

## 2017-01-21 LAB — TECHNOLOGIST REVIEW

## 2017-01-21 NOTE — Telephone Encounter (Signed)
Gave patient avs report and appointments for April  °

## 2017-01-21 NOTE — Assessment & Plan Note (Signed)
This is likely due to her liver disease and hepatitis C. She is not symptomatic.  She does not need transfusion

## 2017-01-21 NOTE — Assessment & Plan Note (Signed)
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  She had recent blood work and CT scan. She is not a candidate for hepatitis C treatment due to active myeloma. Recommend continue observation only.

## 2017-01-21 NOTE — Assessment & Plan Note (Signed)
The patient is aware she has incurable disease and treatment is strictly palliative. We discussed importance of Advanced Directives and Living will. We discussed CODE STATUS; the patient desires to be DNR She does not want life prolonging measures such as hemodialysis of feeding tube placement. She agreed for home-based palliative care/hospice referral.

## 2017-01-21 NOTE — Telephone Encounter (Signed)
[facility] with referral, spoke with Manus Gunning.

## 2017-01-21 NOTE — Assessment & Plan Note (Signed)
She has worsening bone pain likely due to progression of multiple myeloma. She will continue to follow her pain clinic doctor for pain management.

## 2017-01-21 NOTE — Assessment & Plan Note (Signed)
Unfortunately, she has progressed on recent chemotherapy. 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 I do not have other chemotherapy options available for the patient. I will cancel her treatment today. I discussed potential referral back to [facility] for another opinion or referral to another tertiary referral center such as [facility] for second opinion. The patient stated that she has suffered enough from diagnosis of multiple myeloma She would like to stop treatment. We discussed goals of care. Ultimately, she agreed for referral to hospice. I plan to see her back in another month in case she change her mind.

## 2017-01-21 NOTE — Progress Notes (Signed)
[place] OFFICE PROGRESS NOTE  Patient Care Team: Marin Olp, MD as PCP - General (Family Medicine) Jeanann Lewandowsky, MD as Consulting Physician (Medical Oncology) Marin Olp, MD as Consulting Physician (Family Medicine) Heath Lark, MD as Consulting Physician (Hematology and Oncology) [name], MD as Consulting Physician (Orthopedic Surgery)  SUMMARY OF ONCOLOGIC HISTORY: Oncology History   Multiple myeloma, kappa light chain disease, Durie-Salmon stage III       Multiple myeloma in remission ([place])   07/16/2006 Bone Marrow Biopsy    BM biopsy is non-diagnostic      09/21/2006 Procedure    L5 vertebral biopsy 5% plasma cell      10/25/2006 Bone Marrow Biopsy    BM biopsy is hypercellular with 5% plasma cell      11/17/2006 Procedure    L5 biopsy confirmed plasmacytoma      01/03/2007 - 01/10/2007 Radiation Therapy    Approximate date only, received RT for plasmacytoma followed by surgery      09/14/2007 Initial Diagnosis    MULTIPLE  MYELOMA      10/05/2007 Bone Marrow Biopsy    Bm biopsy was negative      06/18/2008 Bone Marrow Biopsy    BM biopsy was negative      07/26/2008 Bone Marrow Transplant    Stem cell transplant at [facility]      04/13/2011 Relapse/Recurrence    Disease relapse      04/14/2011 Bone Marrow Biopsy    Bm biopsy showed 2 % plasma cell      04/27/2011 Relapse/Recurrence    Disease relapse, treated with Velcade/Cytoxan/Dex      09/07/2011 - 07/28/2013 Chemotherapy    She has been receiving Velcade      12/27/2011 Bone Marrow Transplant    2nd transplant at [facility]      07/28/2013 Relapse/Recurrence    Chemo is stopped due to progression of disease      08/29/2013 Imaging    PEt/CT showed recurrence of disease with new lesion on her rib with compression fracture      09/13/2013 Bone Marrow Biopsy    BM biopsy is hypercellular with 6% plasma cell      10/10/2013 - 10/20/2013 Radiation Therapy   Started on palliative XRT for rib pain      11/27/2013 - 04/06/2014 Chemotherapy    The patient starts chemotherapy with Carfilzomib      07/19/2014 Imaging    Repeat bloodwork and PET/CT scan shows significant disease progression.      08/02/2014 - 03/06/2015 Chemotherapy    She enrolled in clinical trial using combination therapy with Revlimid, dexamethasone and Elotuzumab      03/07/2015 - 06/04/2015 Chemotherapy    She is on maintenance Revlimid only without dexamethasone      05/27/2015 Imaging     MRI spine showed new compression fracture.      06/04/2015 - 06/18/2015 Radiation Therapy     she received palliative radiation therapy to 25 Gy C7-T1      07/10/2015 - 08/07/2015 Chemotherapy    She received Elotuzumab, dex and Revlimid. Rx is stopped due to progression      08/01/2015 Imaging    MRI of the spine was performed today due to new onset of worsening right hip pain/flank area. MRI show mild progression of the L1 vertebral body.      08/21/2015 - 08/26/2015 Hospital Admission    She was admitted to the hospital due to pathologic fracture to  right femur      08/22/2015 Surgery    She had IM nail to fractured femur      08/23/2015 - 09/20/2015 Radiation Therapy    She received XRT to her L1 L2 and Right Femur      09/05/2015 - 09/06/2015 Hospital Admission    She had recurrent admission due to displaced fracture, managed conservatively      09/12/2015 - 09/17/2015 Hospital Admission    She had recurrent admission for COPD exacerbation      09/30/2015 -  Chemotherapy    She was started on Pomalyst      12/10/2015 -  Chemotherapy    She started weekly Daratumumab along with Pomalyst      12/03/2016 Imaging    US liver: no acute abnormality of the gallbladder, common bile duct, or liver. subcentimeter septated focus in the right hepatic lobe is likely stable when compared to the CT images from a PET-CT study of July 19, 2014.       INTERVAL HISTORY: Please see  below for problem oriented charting. She returns for further follow-up. She describes significant bone pain and her poor pain medicine is not working well. She denies recent infection, fever or chills.  Her appetite is poor.  She denies recent nausea or constipation.  REVIEW OF SYSTEMS:   Constitutional: Denies fevers, chills or abnormal weight loss Eyes: Denies blurriness of vision Ears, nose, mouth, throat, and face: Denies mucositis or sore throat Respiratory: Denies cough, dyspnea or wheezes Cardiovascular: Denies palpitation, chest discomfort or lower extremity swelling Gastrointestinal:  Denies nausea, heartburn or change in bowel habits Skin: Denies abnormal skin rashes Lymphatics: Denies new lymphadenopathy or easy bruising Neurological:Denies numbness, tingling or new weaknesses Behavioral/Psych: Mood is stable, no new changes  All other systems were reviewed with the patient and are negative.  I have reviewed the past medical history, past surgical history, social history and family history with the patient and they are unchanged from previous note.  ALLERGIES:  is allergic to codeine and ibuprofen.  MEDICATIONS:  Current Outpatient Prescriptions  Medication Sig Dispense Refill  . acyclovir (ZOVIRAX) 400 MG tablet TAKE 1 TABLET BY MOUTH EVERY DAY 30 tablet 2  . albuterol (PROAIR HFA) 108 (90 Base) MCG/ACT inhaler 2 puffs every 4 hours as needed only  if your can't catch your breath 1 Inhaler 0  . ALPRAZolam (XANAX) 0.25 MG tablet Take 1 tablet (0.25 mg total) by mouth at bedtime as needed for anxiety. 60 tablet 0  . aspirin 81 MG tablet Take 81 mg by mouth daily.    . budesonide-formoterol (SYMBICORT) 160-4.5 MCG/ACT inhaler Take 2 puffs first thing in am and then another 2 puffs about 12 hours later. 1 Inhaler 5  . cloNIDine (CATAPRES) 0.1 MG tablet Take 1 tablet (0.1 mg total) by mouth 2 (two) times daily. 60 tablet 11  . dexamethasone (DECADRON) 4 MG tablet Take 1 tablet  (4 mg total) by mouth daily. 60 tablet 1  . gabapentin (NEURONTIN) 600 MG tablet Take 1 tablet (600 mg total) by mouth 3 (three) times daily. (Patient not taking: Reported on 12/31/2016) 90 tablet 3  . levothyroxine (SYNTHROID, LEVOTHROID) 50 MCG tablet TAKE 1 TABLET BY MOUTH EVERY DAY BEFORE BREAKFAST 90 tablet 1  . lidocaine-prilocaine (EMLA) cream Apply 1 application topically as needed. 30 g 6  . losartan (COZAAR) 50 MG tablet TAKE 1 TABLET (50 MG TOTAL) BY MOUTH DAILY. (Patient not taking: Reported on 12/31/2016) 90 tablet 1  .  morphine (MS CONTIN) 100 MG 12 hr tablet Take 100 mg by mouth every 8 (eight) hours.     . Oxycodone HCl 20 MG TABS     . pomalidomide (POMALYST) 2 MG capsule Take 1 capsule (2 mg total) by mouth daily. Take with water on days 1-21. Repeat every 28 days. 21 capsule 0  . prochlorperazine (COMPAZINE) 10 MG tablet Take 1 tablet (10 mg total) by mouth every 6 (six) hours as needed. for nausea 90 tablet 3  . Respiratory Therapy Supplies (FLUTTER) DEVI Use as indicated 1 each 0  . Vitamin D, Ergocalciferol, (DRISDOL) 50000 units CAPS capsule Take 1 capsule (50,000 Units total) by mouth every 7 (seven) days. 12 capsule 3   No current facility-administered medications for this visit.    Facility-Administered Medications Ordered in Other Visits  Medication Dose Route Frequency Provider Last Rate Last Dose  . sodium chloride 0.9 % injection 10 mL  10 mL Intravenous PRN Heath Lark, MD   10 mL at 02/19/16 0911  . sodium chloride 0.9 % injection 10 mL  10 mL Intravenous PRN Heath Lark, MD   10 mL at 12/24/16 0926    PHYSICAL EXAMINATION: ECOG PERFORMANCE STATUS: 2 - Symptomatic, <50% confined to bed  Vitals:   01/21/17 1054  BP: (!) 140/94  Pulse: (!) 117  Resp: 18  Temp: 98 F (36.7 C)   Filed Weights   01/21/17 1054  Weight: 154 lb 1.6 oz (69.9 kg)    GENERAL:alert, no distress and comfortable. She looks Cushingoid SKIN: skin color, texture, turgor are normal, no  rashes or significant lesions EYES: normal, Conjunctiva are pink and non-injected, sclera clear Musculoskeletal:no cyanosis of digits and no clubbing  NEURO: alert & oriented x 3 with fluent speech, no focal motor/sensory deficits  LABORATORY DATA:  I have reviewed the data as listed    Component Value Date/Time   NA 140 01/21/2017 0943   K 4.0 01/21/2017 0943   CL 107 09/11/2015 2255   CL 107 03/10/2013 1014   CO2 23 01/21/2017 0943   GLUCOSE 134 01/21/2017 0943   GLUCOSE 111 (H) 03/10/2013 1014   BUN 13.5 01/21/2017 0943   CREATININE 0.8 01/21/2017 0943   CALCIUM 9.4 01/21/2017 0943   PROT 6.0 (L) 01/21/2017 0943   ALBUMIN 3.2 (L) 01/21/2017 0943   AST 126 (H) 01/21/2017 0943   ALT 112 (H) 01/21/2017 0943   ALKPHOS 175 (H) 01/21/2017 0943   BILITOT 0.81 01/21/2017 0943   GFRNONAA >60 09/12/2015 0620   GFRAA >60 09/12/2015 0620    No results found for: SPEP, UPEP  Lab Results  Component Value Date   WBC 4.7 01/21/2017   NEUTROABS 3.4 01/21/2017   HGB 15.4 01/21/2017   HCT 46.5 01/21/2017   MCV 99.4 01/21/2017   PLT 105 (L) 01/21/2017      Chemistry      Component Value Date/Time   NA 140 01/21/2017 0943   K 4.0 01/21/2017 0943   CL 107 09/11/2015 2255   CL 107 03/10/2013 1014   CO2 23 01/21/2017 0943   BUN 13.5 01/21/2017 0943   CREATININE 0.8 01/21/2017 0943      Component Value Date/Time   CALCIUM 9.4 01/21/2017 0943   ALKPHOS 175 (H) 01/21/2017 0943   AST 126 (H) 01/21/2017 0943   ALT 112 (H) 01/21/2017 0943   BILITOT 0.81 01/21/2017 0943       RADIOGRAPHIC STUDIES: I have personally reviewed the radiological images as listed and  agreed with the findings in the report. Ct Abdomen W Contrast  Result Date: 12/30/2016 CLINICAL DATA:  Chronic hepatitis-C. Elevated LFTs. Multiple myeloma diagnosed 2007. EXAM: CT ABDOMEN WITH CONTRAST TECHNIQUE: Multidetector CT imaging of the abdomen was performed using the standard protocol following bolus  administration of intravenous contrast. CONTRAST:  162m ISOVUE-300 IOPAMIDOL (ISOVUE-300) INJECTION 61% COMPARISON:  PET-CT 07/19/2014 FINDINGS: Lower chest: Lung bases are clear. Hepatobiliary: Small low-density lesions in liver not changed from comparison PET-CT scan. Liver has normal morphology. No intrahepatic duct dilatation. Gallbladder normal. One lesion the RIGHT hepatic lobe may have peripheral enhancement but again is not changed in size from comparison exam (7 mm image 17, series 2 compared to 8 mm on prior). Pancreas: Pancreas is normal. No ductal dilatation. No pancreatic inflammation. Spleen: Normal spleen Adrenals/urinary tract: Adrenal glands and kidneys are normal. The ureters and bladder normal. Stomach/Bowel: Stomach limited view of the small bowel is unremarkable. Vascular/Lymphatic: Abdominal or is calcified. No retroperitoneal adenopathy. Reproductive: Other: No free fluid. Musculoskeletal: Multiple lytic lesions throughout the spine in upper pelvis or unchanged from comparison exam pre IMPRESSION: 1. Low-density lesions within liver are not change from comparison exam. These cannot be fully characterize but stability suggest benign etiology. Consider MRI with and without contrast for hepatic evaluation with chronic hepatitis-C. 2. No morphologic changes of cirrhosis. 3.  Atherosclerotic calcification of the aorta. 4. Stable lytic myeloma lesions within the spine and pelvis. Electronically Signed   By: SSuzy BouchardM.D.   On: 12/30/2016 16:22   Dg Hip Unilat W Or W/o Pelvis 1 View Left  Result Date: 12/24/2016 CLINICAL DATA:  Acute onset of severe left hip pain. Initial encounter. EXAM: DG HIP (WITH OR WITHOUT PELVIS) 1V*L* COMPARISON:  None. FINDINGS: There is no evidence of fracture or dislocation. Both femoral heads are seated normally within their respective acetabula. The right femoral hardware is incompletely imaged, but appears grossly unremarkable. The proximal left femur  appears intact. Postoperative change is noted at the lower lumbar spine and right sacrum. The sacroiliac joints are unremarkable in appearance. The visualized bowel gas pattern is grossly unremarkable in appearance. Scattered phleboliths are noted within the pelvis. IMPRESSION: No evidence of fracture or dislocation. Electronically Signed   By: JGarald BaldingM.D.   On: 12/24/2016 16:35   Dg Femur 1v Left  Result Date: 12/24/2016 CLINICAL DATA:  Acute onset of left leg pain.  Initial encounter. EXAM: LEFT FEMUR 1 VIEW COMPARISON:  Left femur radiographs performed 05/13/2016 FINDINGS: There is no evidence of fracture or dislocation. The left femur appears intact. The left femoral head remains seated at the acetabulum. The knee joint is grossly unremarkable, though incompletely assessed. No definite soft abnormalities are characterized on radiograph. IMPRESSION: No evidence of fracture or dislocation. Electronically Signed   By: JGarald BaldingM.D.   On: 12/24/2016 16:36    ASSESSMENT & PLAN:  Multiple myeloma in remission ([facility] Unfortunately, she has progressed on recent chemotherapy. 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 I do not have other chemotherapy options available for the patient. I will cancel her treatment today. I discussed potential referral back to [facility] for another opinion or referral [facility]. The patient stated that she has suffered enough from diagnosis of multiple myeloma She would like to stop treatment. We discussed goals of care. Ultimately, she agreed for referral to hospice. I plan to  see her back in another month in case she change her mind.  Thrombocytopenia ([place]) This is likely due to her liver disease and hepatitis C. She is not symptomatic.  She does not need transfusion  Cancer associated pain She has worsening bone  pain likely due to progression of multiple myeloma. She will continue to follow her pain clinic doctor for pain management.  Elevated liver enzymes She has acute liver enzyme abnormalities due to reactivation of hepatitis C.  She had recent blood work and CT scan. She is not a candidate for hepatitis C treatment due to active myeloma. Recommend continue observation only.  Goals of care, counseling/discussion The patient is aware she has incurable disease and treatment is strictly palliative. We discussed importance of Advanced Directives and Living will. We discussed CODE STATUS; the patient desires to be DNR She does not want life prolonging measures such as hemodialysis of feeding tube placement. She agreed for home-based palliative care/hospice referral.   No orders of the defined types were placed in this encounter.  All questions were answered. The patient knows to call the clinic with any problems, questions or concerns. No barriers to learning was detected. I spent 30 minutes counseling the patient face to face. The total time spent in the appointment was 40 minutes and more than 50% was on counseling and review of test results     Heath Lark, MD 01/21/2017 1:39 PM

## 2017-01-22 ENCOUNTER — Telehealth: Payer: Self-pay | Admitting: Family Medicine

## 2017-01-22 ENCOUNTER — Telehealth: Payer: Self-pay

## 2017-01-22 LAB — KAPPA/LAMBDA LIGHT CHAINS
Ig Kappa Free Light Chain: 297.4 mg/L — ABNORMAL HIGH (ref 3.3–19.4)
Ig Lambda Free Light Chain: 3.5 mg/L — ABNORMAL LOW (ref 5.7–26.3)
KAPPA/LAMBDA FLC RATIO: 84.97 — AB (ref 0.26–1.65)

## 2017-01-22 NOTE — Telephone Encounter (Signed)
Patient called to ask for a 2nd opinion from a liver specialist, she stated her family is insisting on this. She wants to proceed with Hospice.

## 2017-01-22 NOTE — Telephone Encounter (Signed)
Manus Gunning would like to know if md will be the attending physician for hospice

## 2017-01-22 NOTE — Telephone Encounter (Signed)
Yes thanks 

## 2017-01-24 ENCOUNTER — Encounter (HOSPITAL_COMMUNITY): Payer: Self-pay | Admitting: Emergency Medicine

## 2017-01-24 ENCOUNTER — Emergency Department (HOSPITAL_COMMUNITY): Payer: Medicare Other

## 2017-01-24 ENCOUNTER — Emergency Department (HOSPITAL_COMMUNITY)
Admission: EM | Admit: 2017-01-24 | Discharge: 2017-01-24 | Disposition: A | Discharge status: Home / Self Care | Payer: Medicare Other | Attending: Emergency Medicine | Admitting: Emergency Medicine

## 2017-01-24 DIAGNOSIS — I10 Essential (primary) hypertension: Secondary | ICD-10-CM | POA: Diagnosis not present

## 2017-01-24 DIAGNOSIS — E039 Hypothyroidism, unspecified: Secondary | ICD-10-CM | POA: Diagnosis not present

## 2017-01-24 DIAGNOSIS — J449 Chronic obstructive pulmonary disease, unspecified: Secondary | ICD-10-CM | POA: Diagnosis not present

## 2017-01-24 DIAGNOSIS — W228XXA Striking against or struck by other objects, initial encounter: Secondary | ICD-10-CM | POA: Diagnosis not present

## 2017-01-24 DIAGNOSIS — S299XXA Unspecified injury of thorax, initial encounter: Secondary | ICD-10-CM | POA: Diagnosis present

## 2017-01-24 DIAGNOSIS — F17210 Nicotine dependence, cigarettes, uncomplicated: Secondary | ICD-10-CM | POA: Insufficient documentation

## 2017-01-24 DIAGNOSIS — S20211A Contusion of right front wall of thorax, initial encounter: Secondary | ICD-10-CM | POA: Insufficient documentation

## 2017-01-24 DIAGNOSIS — Y9389 Activity, other specified: Secondary | ICD-10-CM | POA: Diagnosis not present

## 2017-01-24 DIAGNOSIS — Y999 Unspecified external cause status: Secondary | ICD-10-CM | POA: Insufficient documentation

## 2017-01-24 DIAGNOSIS — Z7982 Long term (current) use of aspirin: Secondary | ICD-10-CM | POA: Insufficient documentation

## 2017-01-24 DIAGNOSIS — Y929 Unspecified place or not applicable: Secondary | ICD-10-CM | POA: Diagnosis not present

## 2017-01-24 DIAGNOSIS — S20212A Contusion of left front wall of thorax, initial encounter: Secondary | ICD-10-CM | POA: Diagnosis not present

## 2017-01-24 MED ORDER — METHOCARBAMOL 500 MG PO TABS: 500.0000 mg | ORAL_TABLET | Freq: Two times a day (BID) | ORAL | 0 refills | Status: AC

## 2017-01-24 MED ORDER — DIAZEPAM 2 MG PO TABS
2.0000 mg | ORAL_TABLET | Freq: Once | ORAL | Status: AC
  Administered 2017-01-24: 2 mg via ORAL
  Filled 2017-01-24: qty 1

## 2017-01-24 MED ORDER — HYDROMORPHONE HCL 1 MG/ML IJ SOLN
1.0000 mg | Freq: Once | INTRAMUSCULAR | Status: AC
  Administered 2017-01-24: 1 mg via INTRAMUSCULAR
  Filled 2017-01-24: qty 1

## 2017-01-24 MED ORDER — LIDOCAINE HCL 4 % EX SOLN
Freq: Once | CUTANEOUS | Status: AC
  Administered 2017-01-24: 16:00:00 via TOPICAL
  Filled 2017-01-24: qty 50

## 2017-01-24 MED ORDER — LIDOCAINE 5 % EX OINT: 1.0000 "application " | TOPICAL_OINTMENT | CUTANEOUS | 0 refills | Status: AC | PRN

## 2017-01-24 MED ORDER — NAPROXEN 500 MG PO TABS
500.0000 mg | ORAL_TABLET | Freq: Once | ORAL | Status: AC
  Administered 2017-01-24: 500 mg via ORAL
  Filled 2017-01-24: qty 1

## 2017-01-24 NOTE — ED Provider Notes (Signed)
[facility] DEPT Provider Note   CSN: [id number] Arrival date & time: 01/24/17  1201     History   Chief Complaint Chief Complaint  Patient presents with  . Rib Injury    HPI Kendra Johns is a 66 y.o. female.  HPI Pt comes in with cc of R sided rib pain. Pt has multiple medical problems, and also chronic pain for which she sees pain medicine. Pt reports that last night she was doing laundry, and she injured her R side of the chest by hitting it on to the washing machine. Since then pt has been having pain with movement, pain with inspiration, pain with cough. Pt has no dib.  Pt takes 20 mg oxy, and she did get temporary relief.  Past Medical History:  Diagnosis Date  . Acute intractable headache 11/14/2015  . Allergy   . Anxiety   . Chronic fatigue 03/07/2015  . Chronic low back pain   . Cough 08/02/2014  . Dehydration 11/28/2013  . Depression   . DEPRESSION 08/31/2006   Qualifier: Diagnosis of  By: [name], [name]    . Diverticulosis of colon   . DIVERTICULOSIS, COLON 08/31/2006  . Dysuria 10/08/2015  . External hemorrhoids   . Fever 11/28/2013  . GERD with stricture   . Helicobacter pylori gastritis 2001   ? if treated  . Hepatitis B virus infection   . Hepatitis C    genotype 1  . Hx of adenomatous colonic polyps 2007  . Hyperlipidemia   . Hypertension   . IBS (irritable bowel syndrome)   . Multiple myeloma    kappa light chain, s/p high-dose chemo/stem cell tx - [facility]  . Muscle cramp 11/21/2014  . Nausea alone 01/22/2014  . Oral infection 02/19/2016  . Osteoarthritis   . S/P radiation therapy 06/05/15-06/18/15   C7-T1   . Sciatic nerve pain    with Dr. Earle Gell  . Sleep apnea   . Unspecified vitamin D deficiency 09/22/2013  . Vitamin D deficiency 05/28/2015    Patient Active Problem List   Diagnosis Date Noted  . Goals of care, counseling/discussion 01/21/2017  . Cushingoid side effect of steroids ([place]) 12/24/2016  . Elevated liver enzymes  11/26/2016  . Bilateral leg pain 05/13/2016  . Oral infection 02/19/2016  . Pressure sore on heel 02/19/2016  . Occipital neuralgia of right side 01/02/2016  . Drug-induced neutropenia ([place]) 12/17/2015  . Encounter for antineoplastic chemotherapy 11/27/2015  . Acute intractable headache 11/14/2015  . Neck pain, bilateral posterior 11/05/2015  . Dysuria 10/08/2015  . COPD GOLD I 09/12/2015  . Right ischial fracture ([place]) 09/06/2015  . 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 Anemia 09/05/2015  . Cigarette smoker 08/22/2015  . Cancer associated pain 08/21/2015  . Hypothyroidism 08/21/2015  . Vitamin D deficiency 05/28/2015  . Chronic fatigue 03/07/2015  . Poor dentition 01/23/2015  . Thrombocytopenia ([place]) 10/10/2014  . Gastritis 08/30/2014  . Cough 08/02/2014  . 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 Nausea without vomiting 01/22/2014  . Bilateral low back pain without sciatica 03/17/2011  . Hyperlipidemia 02/13/2011  . Essential hypertension 03/11/2010  . 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 Multiple myeloma in remission ([place]) 09/14/2007  . Chronic hepatitis C ([place]) 08/31/2006  . History of adenomatous colon polyps 08/31/2006    Past Surgical History:  Procedure Laterality Date  . ABDOMINAL HYSTERECTOMY    . APPENDECTOMY    . BONE  MARROW TRANSPLANT     2009, 2013 [facility]  . CHOLECYSTECTOMY    . COLONOSCOPY W/ POLYPECTOMY  08/2006   1-2 adenomas, 6 polyps total, diverticulosis and external hemorrhoids  . DILATION AND CURETTAGE OF UTERUS    . FEMUR IM NAIL Right 08/22/2015   Procedure: INTRAMEDULLARY (IM) NAIL FEMORAL;  Surgeon: Sheral Apley, MD;  Location: [facility] OR;  Service: Orthopedics;  Laterality: Right;  . OOPHORECTOMY    . TONSILLECTOMY AND ADENOIDECTOMY    . UPPER GASTROINTESTINAL ENDOSCOPY  08/2003   esophageal stricture dilation, hiatal hernia, gastrritis    OB History    No data available         Home Medications    Prior to Admission medications   Medication Sig Start Date End Date Taking? Authorizing Provider  acyclovir (ZOVIRAX) 400 MG tablet TAKE 1 TABLET BY MOUTH EVERY DAY 11/30/16   Artis Delay, MD  albuterol (PROAIR HFA) 108 (90 Base) MCG/ACT inhaler 2 puffs every 4 hours as needed only  if your can't catch your breath 10/13/16   Nyoka Cowden, MD  ALPRAZolam Prudy Feeler) 0.25 MG tablet Take 1 tablet (0.25 mg total) by mouth at bedtime as needed for anxiety. 10/07/16   Artis Delay, MD  aspirin 81 MG tablet Take 81 mg by mouth daily.    Historical Provider, MD  budesonide-formoterol (SYMBICORT) 160-4.5 MCG/ACT inhaler Take 2 puffs first thing in am and then another 2 puffs about 12 hours later. 12/18/16   Tammy S Parrett, NP  cloNIDine (CATAPRES) 0.1 MG tablet Take 1 tablet (0.1 mg total) by mouth 2 (two) times daily. 10/13/16   Nyoka Cowden, MD  dexamethasone (DECADRON) 4 MG tablet Take 1 tablet (4 mg total) by mouth daily. 09/22/16   Artis Delay, MD  gabapentin (NEURONTIN) 600 MG tablet Take 1 tablet (600 mg total) by mouth 3 (three) times daily. Patient not taking: Reported on 12/31/2016 02/13/16   York Spaniel, MD  levothyroxine (SYNTHROID, LEVOTHROID) 50 MCG tablet TAKE 1 TABLET BY MOUTH EVERY DAY BEFORE BREAKFAST 09/13/16   Artis Delay, MD  lidocaine (XYLOCAINE) 5 % ointment Apply 1 application topically as needed. 01/24/17   Derwood Kaplan, MD  lidocaine-prilocaine (EMLA) cream Apply 1 application topically as needed. 11/26/16   Artis Delay, MD  losartan (COZAAR) 50 MG tablet TAKE 1 TABLET (50 MG TOTAL) BY MOUTH DAILY. Patient not taking: Reported on 12/31/2016 11/30/16   Shelva Majestic, MD  methocarbamol (ROBAXIN) 500 MG tablet Take 1 tablet (500 mg total) by mouth 2 (two) times daily. 01/24/17   Derwood Kaplan, MD  morphine (MS CONTIN) 100 MG 12 hr tablet Take 100 mg by mouth every 8 (eight) hours.     Historical Provider, MD  Oxycodone HCl 20 MG TABS  10/01/16   Historical  Provider, MD  pomalidomide (POMALYST) 2 MG capsule Take 1 capsule (2 mg total) by mouth daily. Take with water on days 1-21. Repeat every 28 days. 12/16/16   Artis Delay, MD  prochlorperazine (COMPAZINE) 10 MG tablet Take 1 tablet (10 mg total) by mouth every 6 (six) hours as needed. for nausea 07/22/16   Artis Delay, MD  Respiratory Therapy Supplies (FLUTTER) DEVI Use as indicated 11/20/16   Nyoka Cowden, MD  Vitamin D, Ergocalciferol, (DRISDOL) 50000 units CAPS capsule Take 1 capsule (50,000 Units total) by mouth every 7 (seven) days. 07/22/16   Artis Delay, MD    Family History Family History  Problem Relation Age of Onset  .  Lung cancer Father     smoked  . Leukemia Mother   . Alcohol abuse    . Cirrhosis Sister   . Lung cancer Sister     smoked  . Colon cancer Neg Hx   . Neuropathy Neg Hx     Social History Social History  Substance Use Topics  . Smoking status: Current Every Day Smoker    Packs/day: 0.25    Years: 44.00    Types: Cigarettes  . Smokeless tobacco: Never Used  . Alcohol use No     Allergies   Codeine and Ibuprofen   Review of Systems Review of Systems  Constitutional: Positive for activity change.  Respiratory: Negative for shortness of breath.   Cardiovascular: Positive for chest pain.  Musculoskeletal: Positive for myalgias.  Hematological: Does not bruise/bleed easily.       Physical Exam Updated Vital Signs BP (!) 168/102 (BP Location: Left Arm)   Pulse 72   Temp 98.8 F (37.1 C) (Oral)   Resp 12   Ht '5\' 4"'$  (1.626 m)   Wt 154 lb (69.9 kg)   SpO2 98%   BMI 26.43 kg/m   Physical Exam  Constitutional: She is oriented to person, place, and time. She appears well-developed and well-nourished.  HENT:  Head: Normocephalic and atraumatic.  Eyes: EOM are normal. Pupils are equal, round, and reactive to light.  Neck: Neck supple.  Cardiovascular: Normal rate, regular rhythm and normal heart sounds.   No murmur heard. Pulmonary/Chest:  Effort normal. No respiratory distress. She exhibits tenderness.  Reproducible tenderness over the R lower ribs, no ecchymoses  Abdominal: Soft. She exhibits no distension. There is no tenderness. There is no rebound and no guarding.  Neurological: She is alert and oriented to person, place, and time.  Skin: Skin is warm and dry.  Nursing note and vitals reviewed.    ED Treatments / Results  Labs (all labs ordered are listed, but only abnormal results are displayed) Labs Reviewed - No data to display  EKG  EKG Interpretation None       Radiology Dg Chest 2 View  Result Date: 01/24/2017 CLINICAL DATA:  Pt was bending over her washing machine yesterday to get a rug out, and she felt a "pop" in right anterior ribs. H/o previous rib fractures to right side. H/o multiple myeloma, COPD, controlled HTN. Smoker. EXAM: CHEST  2 VIEW COMPARISON:  10/13/2016 FINDINGS: Patient is a right-sided PowerPort, tip overlying the level of superior vena cava. Heart size is normal. There is minimal patchy density at the left lung base most consistent with subsegmental atelectasis. No pneumothorax. There are remote right posterior fractures of ribs 6 and 7. No acute displaced fractures are identified. IMPRESSION: No evidence for acute  abnormality. Electronically Signed   By: Nolon Nations M.D.   On: 01/24/2017 13:15    Procedures Procedures (including critical care time)  Medications Ordered in ED Medications  naproxen (NAPROSYN) tablet 500 mg (not administered)  HYDROmorphone (DILAUDID) injection 1 mg (not administered)  lidocaine (XYLOCAINE) 4 % external solution (not administered)  diazepam (VALIUM) tablet 2 mg (not administered)     Initial Impression / Assessment and Plan / ED Course  I have reviewed the triage vital signs and the nursing notes.  Pertinent labs & imaging results that were available during my care of the patient were reviewed by me and considered in my medical decision  making (see chart for details).     Pt comes in with cc  of rib pain. Pain is on the R side. Xrays ordered from triage, neg for ptx and rib fracture. We will give lidocaine patch, nsaids, and muscle relaxants.  Pt advised to inform her pain management team about the visit. Dilaudid IM here prior to d/c  Final Clinical Impressions(s) / ED Diagnoses   Final diagnoses:  Contusion of rib on left side, initial encounter    New Prescriptions New Prescriptions   LIDOCAINE (XYLOCAINE) 5 % OINTMENT    Apply 1 application topically as needed.   METHOCARBAMOL (ROBAXIN) 500 MG TABLET    Take 1 tablet (500 mg total) by mouth 2 (two) times daily.     Varney Biles, MD 01/24/17 1615

## 2017-01-24 NOTE — ED Triage Notes (Signed)
Pt leaning into wash machine last night.  Placed pressure on rt rib.  Felt pop.  Having pain today.

## 2017-01-24 NOTE — ED Notes (Signed)
ED Provider at bedside. 

## 2017-01-24 NOTE — Discharge Instructions (Signed)
Please take the medicine prescribed. See your pain doctor if needed for pain control.

## 2017-01-25 ENCOUNTER — Telehealth: Payer: Self-pay | Admitting: *Deleted

## 2017-01-25 ENCOUNTER — Other Ambulatory Visit: Payer: Self-pay | Admitting: Hematology and Oncology

## 2017-01-25 ENCOUNTER — Telehealth: Payer: Self-pay | Admitting: Family Medicine

## 2017-01-25 ENCOUNTER — Encounter: Payer: Self-pay | Admitting: Physician Assistant

## 2017-01-25 DIAGNOSIS — B182 Chronic viral hepatitis C: Secondary | ICD-10-CM

## 2017-01-25 LAB — MULTIPLE MYELOMA PANEL, SERUM
ALBUMIN/GLOB SERPL: 1.4 (ref 0.7–1.7)
ALPHA 1: 0.3 g/dL (ref 0.0–0.4)
ALPHA2 GLOB SERPL ELPH-MCNC: 0.9 g/dL (ref 0.4–1.0)
Albumin SerPl Elph-Mcnc: 3.1 g/dL (ref 2.9–4.4)
B-GLOBULIN SERPL ELPH-MCNC: 0.9 g/dL (ref 0.7–1.3)
GAMMA GLOB SERPL ELPH-MCNC: 0.3 g/dL — AB (ref 0.4–1.8)
GLOBULIN, TOTAL: 2.3 g/dL (ref 2.2–3.9)
IGG (IMMUNOGLOBIN G), SERUM: 305 mg/dL — AB (ref 700–1600)
IgA, Qn, Serum: 23 mg/dL — ABNORMAL LOW (ref 87–352)
IgM, Qn, Serum: 24 mg/dL — ABNORMAL LOW (ref 26–217)
M Protein SerPl Elph-Mcnc: 0.1 g/dL — ABNORMAL HIGH
Total Protein: 5.4 g/dL — ABNORMAL LOW (ref 6.0–8.5)

## 2017-01-25 NOTE — Telephone Encounter (Signed)
Referral

## 2017-01-25 NOTE — Telephone Encounter (Signed)
See my phone note from today.

## 2017-01-25 NOTE — Telephone Encounter (Signed)
Margie called back to advise pt was set up by her oncologist to see  Dr Carlean Purl.

## 2017-01-25 NOTE — Telephone Encounter (Signed)
-----   Message from Carolyn Stare sent at 01/25/2017 10:55 AM EDT -----   Patient call to ask if you would refer her to a liver and kidney doctor  because she want someone to explain what is really going on with her liver and kidneys. She does not fell her oncologist is explaining it to her where she fully understands  [place]

## 2017-01-25 NOTE — Telephone Encounter (Signed)
Spoke with patient. She already has an appointment with Dr. Carlean Purl on 4/2.   I walked her through the interplay of the multiple myeloma not responding to therapy in addition to reactivation of Hepatitis C and reasoning for hospice.   She was also worried about kidneys and I explained her kidney function is reasonably preserved. Discussed LFTs are elevated but not drastically elevated at this point.   Told her with all she has on her plate could move back appointment with me on 4/2. She may do that. She would still like to see Dr. Carlean Purl to get his opinion- told her getting the answers she needs is the most important thing and I am here to help answer any questions she may have for me.

## 2017-01-25 NOTE — Telephone Encounter (Signed)
Urgent referral called to Dr Carlean Purl.  Appt 4/2 @ 2:45 pm   Pt notified.

## 2017-01-25 NOTE — Telephone Encounter (Signed)
She saw Dr. Carlean Purl in the past I placed order/referral. Please call when able as this is an urgent consult

## 2017-01-25 NOTE — Telephone Encounter (Signed)
margie/nurse w/ [facility] in [place] would like Dr Yong Channel to know that after her visit with the pt, she would like a referral to a GI doctor to hear for herself just how bad her liver is before she signs up with hospice. Pt was by herself when they gave her the first information, so she is not quite clear on what was discussed.  Margie said pt was going to call the office and tell the docot this same information.

## 2017-01-26 DIAGNOSIS — Z79891 Long term (current) use of opiate analgesic: Secondary | ICD-10-CM | POA: Diagnosis not present

## 2017-01-26 DIAGNOSIS — G894 Chronic pain syndrome: Secondary | ICD-10-CM | POA: Diagnosis not present

## 2017-01-26 DIAGNOSIS — M545 Low back pain: Secondary | ICD-10-CM | POA: Diagnosis not present

## 2017-01-26 DIAGNOSIS — K5900 Constipation, unspecified: Secondary | ICD-10-CM | POA: Diagnosis not present

## 2017-02-01 ENCOUNTER — Ambulatory Visit: Payer: Self-pay | Admitting: Physician Assistant

## 2017-02-01 ENCOUNTER — Ambulatory Visit: Payer: Medicare Other | Admitting: Family Medicine

## 2017-02-08 ENCOUNTER — Telehealth: Payer: Self-pay | Admitting: Medical Oncology

## 2017-02-08 NOTE — Telephone Encounter (Signed)
Pt referred to [facility] 3//22. Delayed by pt. who wanted to wait to discuss with her GI provider. She has and pt called [facility] today -she is ready for their services. They initiate services.

## 2017-02-09 ENCOUNTER — Telehealth: Payer: Self-pay | Admitting: Family Medicine

## 2017-02-09 DIAGNOSIS — Z452 Encounter for adjustment and management of vascular access device: Secondary | ICD-10-CM | POA: Diagnosis not present

## 2017-02-09 DIAGNOSIS — B182 Chronic viral hepatitis C: Secondary | ICD-10-CM | POA: Diagnosis not present

## 2017-02-09 DIAGNOSIS — I10 Essential (primary) hypertension: Secondary | ICD-10-CM | POA: Diagnosis not present

## 2017-02-09 DIAGNOSIS — C9002 Multiple myeloma in relapse: Secondary | ICD-10-CM | POA: Diagnosis not present

## 2017-02-09 DIAGNOSIS — G8929 Other chronic pain: Secondary | ICD-10-CM | POA: Diagnosis not present

## 2017-02-09 DIAGNOSIS — J449 Chronic obstructive pulmonary disease, unspecified: Secondary | ICD-10-CM | POA: Diagnosis not present

## 2017-02-09 DIAGNOSIS — Z720 Tobacco use: Secondary | ICD-10-CM | POA: Diagnosis not present

## 2017-02-09 NOTE — Telephone Encounter (Signed)
Noted thanks °

## 2017-02-09 NOTE — Telephone Encounter (Signed)
Kendra Johns is calling to let md know the pt has enrolled in hospice program today

## 2017-02-11 DIAGNOSIS — J449 Chronic obstructive pulmonary disease, unspecified: Secondary | ICD-10-CM | POA: Diagnosis not present

## 2017-02-11 DIAGNOSIS — Z452 Encounter for adjustment and management of vascular access device: Secondary | ICD-10-CM | POA: Diagnosis not present

## 2017-02-11 DIAGNOSIS — C9002 Multiple myeloma in relapse: Secondary | ICD-10-CM | POA: Diagnosis not present

## 2017-02-11 DIAGNOSIS — I10 Essential (primary) hypertension: Secondary | ICD-10-CM | POA: Diagnosis not present

## 2017-02-11 DIAGNOSIS — G8929 Other chronic pain: Secondary | ICD-10-CM | POA: Diagnosis not present

## 2017-02-11 DIAGNOSIS — B182 Chronic viral hepatitis C: Secondary | ICD-10-CM | POA: Diagnosis not present

## 2017-02-12 ENCOUNTER — Ambulatory Visit: Payer: Self-pay | Admitting: Physician Assistant

## 2017-02-12 DIAGNOSIS — B182 Chronic viral hepatitis C: Secondary | ICD-10-CM | POA: Diagnosis not present

## 2017-02-12 DIAGNOSIS — G8929 Other chronic pain: Secondary | ICD-10-CM | POA: Diagnosis not present

## 2017-02-12 DIAGNOSIS — J449 Chronic obstructive pulmonary disease, unspecified: Secondary | ICD-10-CM | POA: Diagnosis not present

## 2017-02-12 DIAGNOSIS — C9002 Multiple myeloma in relapse: Secondary | ICD-10-CM | POA: Diagnosis not present

## 2017-02-12 DIAGNOSIS — I10 Essential (primary) hypertension: Secondary | ICD-10-CM | POA: Diagnosis not present

## 2017-02-12 DIAGNOSIS — Z452 Encounter for adjustment and management of vascular access device: Secondary | ICD-10-CM | POA: Diagnosis not present

## 2017-02-16 DIAGNOSIS — C9002 Multiple myeloma in relapse: Secondary | ICD-10-CM | POA: Diagnosis not present

## 2017-02-16 DIAGNOSIS — J449 Chronic obstructive pulmonary disease, unspecified: Secondary | ICD-10-CM | POA: Diagnosis not present

## 2017-02-16 DIAGNOSIS — B182 Chronic viral hepatitis C: Secondary | ICD-10-CM | POA: Diagnosis not present

## 2017-02-16 DIAGNOSIS — Z452 Encounter for adjustment and management of vascular access device: Secondary | ICD-10-CM | POA: Diagnosis not present

## 2017-02-16 DIAGNOSIS — G8929 Other chronic pain: Secondary | ICD-10-CM | POA: Diagnosis not present

## 2017-02-16 DIAGNOSIS — I10 Essential (primary) hypertension: Secondary | ICD-10-CM | POA: Diagnosis not present

## 2017-02-19 ENCOUNTER — Telehealth: Payer: Self-pay | Admitting: Family Medicine

## 2017-02-19 DIAGNOSIS — J449 Chronic obstructive pulmonary disease, unspecified: Secondary | ICD-10-CM | POA: Diagnosis not present

## 2017-02-19 DIAGNOSIS — I10 Essential (primary) hypertension: Secondary | ICD-10-CM | POA: Diagnosis not present

## 2017-02-19 DIAGNOSIS — B182 Chronic viral hepatitis C: Secondary | ICD-10-CM | POA: Diagnosis not present

## 2017-02-19 DIAGNOSIS — Z452 Encounter for adjustment and management of vascular access device: Secondary | ICD-10-CM | POA: Diagnosis not present

## 2017-02-19 DIAGNOSIS — C9002 Multiple myeloma in relapse: Secondary | ICD-10-CM | POA: Diagnosis not present

## 2017-02-19 DIAGNOSIS — G8929 Other chronic pain: Secondary | ICD-10-CM | POA: Diagnosis not present

## 2017-02-19 NOTE — Telephone Encounter (Signed)
Thanks for update

## 2017-02-19 NOTE — Telephone Encounter (Signed)
Drucie Ip from [facility] called to notify MD that the patient went the hospice home for pain management on 01/2017.  Contact Info: [facility]

## 2017-02-20 DIAGNOSIS — Z452 Encounter for adjustment and management of vascular access device: Secondary | ICD-10-CM | POA: Diagnosis not present

## 2017-02-20 DIAGNOSIS — C9002 Multiple myeloma in relapse: Secondary | ICD-10-CM | POA: Diagnosis not present

## 2017-02-20 DIAGNOSIS — B182 Chronic viral hepatitis C: Secondary | ICD-10-CM | POA: Diagnosis not present

## 2017-02-20 DIAGNOSIS — J449 Chronic obstructive pulmonary disease, unspecified: Secondary | ICD-10-CM | POA: Diagnosis not present

## 2017-02-20 DIAGNOSIS — G8929 Other chronic pain: Secondary | ICD-10-CM | POA: Diagnosis not present

## 2017-02-20 DIAGNOSIS — I10 Essential (primary) hypertension: Secondary | ICD-10-CM | POA: Diagnosis not present

## 2017-02-21 DIAGNOSIS — B182 Chronic viral hepatitis C: Secondary | ICD-10-CM | POA: Diagnosis not present

## 2017-02-21 DIAGNOSIS — C9002 Multiple myeloma in relapse: Secondary | ICD-10-CM | POA: Diagnosis not present

## 2017-02-21 DIAGNOSIS — I10 Essential (primary) hypertension: Secondary | ICD-10-CM | POA: Diagnosis not present

## 2017-02-21 DIAGNOSIS — Z452 Encounter for adjustment and management of vascular access device: Secondary | ICD-10-CM | POA: Diagnosis not present

## 2017-02-21 DIAGNOSIS — J449 Chronic obstructive pulmonary disease, unspecified: Secondary | ICD-10-CM | POA: Diagnosis not present

## 2017-02-21 DIAGNOSIS — G8929 Other chronic pain: Secondary | ICD-10-CM | POA: Diagnosis not present

## 2017-02-22 DIAGNOSIS — C9002 Multiple myeloma in relapse: Secondary | ICD-10-CM | POA: Diagnosis not present

## 2017-02-22 DIAGNOSIS — B182 Chronic viral hepatitis C: Secondary | ICD-10-CM | POA: Diagnosis not present

## 2017-02-22 DIAGNOSIS — G8929 Other chronic pain: Secondary | ICD-10-CM | POA: Diagnosis not present

## 2017-02-22 DIAGNOSIS — I10 Essential (primary) hypertension: Secondary | ICD-10-CM | POA: Diagnosis not present

## 2017-02-22 DIAGNOSIS — Z452 Encounter for adjustment and management of vascular access device: Secondary | ICD-10-CM | POA: Diagnosis not present

## 2017-02-22 DIAGNOSIS — J449 Chronic obstructive pulmonary disease, unspecified: Secondary | ICD-10-CM | POA: Diagnosis not present

## 2017-02-23 ENCOUNTER — Ambulatory Visit: Payer: Self-pay | Admitting: Hematology and Oncology

## 2017-02-23 DIAGNOSIS — G8929 Other chronic pain: Secondary | ICD-10-CM | POA: Diagnosis not present

## 2017-02-23 DIAGNOSIS — B182 Chronic viral hepatitis C: Secondary | ICD-10-CM | POA: Diagnosis not present

## 2017-02-23 DIAGNOSIS — C9002 Multiple myeloma in relapse: Secondary | ICD-10-CM | POA: Diagnosis not present

## 2017-02-23 DIAGNOSIS — J449 Chronic obstructive pulmonary disease, unspecified: Secondary | ICD-10-CM | POA: Diagnosis not present

## 2017-02-23 DIAGNOSIS — I10 Essential (primary) hypertension: Secondary | ICD-10-CM | POA: Diagnosis not present

## 2017-02-23 DIAGNOSIS — Z452 Encounter for adjustment and management of vascular access device: Secondary | ICD-10-CM | POA: Diagnosis not present

## 2017-02-24 DIAGNOSIS — G8929 Other chronic pain: Secondary | ICD-10-CM | POA: Diagnosis not present

## 2017-02-24 DIAGNOSIS — J449 Chronic obstructive pulmonary disease, unspecified: Secondary | ICD-10-CM | POA: Diagnosis not present

## 2017-02-24 DIAGNOSIS — B182 Chronic viral hepatitis C: Secondary | ICD-10-CM | POA: Diagnosis not present

## 2017-02-24 DIAGNOSIS — I10 Essential (primary) hypertension: Secondary | ICD-10-CM | POA: Diagnosis not present

## 2017-02-24 DIAGNOSIS — Z452 Encounter for adjustment and management of vascular access device: Secondary | ICD-10-CM | POA: Diagnosis not present

## 2017-02-24 DIAGNOSIS — C9002 Multiple myeloma in relapse: Secondary | ICD-10-CM | POA: Diagnosis not present

## 2017-02-25 DIAGNOSIS — G8929 Other chronic pain: Secondary | ICD-10-CM | POA: Diagnosis not present

## 2017-02-25 DIAGNOSIS — I10 Essential (primary) hypertension: Secondary | ICD-10-CM | POA: Diagnosis not present

## 2017-02-25 DIAGNOSIS — C9002 Multiple myeloma in relapse: Secondary | ICD-10-CM | POA: Diagnosis not present

## 2017-02-25 DIAGNOSIS — J449 Chronic obstructive pulmonary disease, unspecified: Secondary | ICD-10-CM | POA: Diagnosis not present

## 2017-02-25 DIAGNOSIS — B182 Chronic viral hepatitis C: Secondary | ICD-10-CM | POA: Diagnosis not present

## 2017-02-25 DIAGNOSIS — Z452 Encounter for adjustment and management of vascular access device: Secondary | ICD-10-CM | POA: Diagnosis not present

## 2017-02-26 DIAGNOSIS — C9002 Multiple myeloma in relapse: Secondary | ICD-10-CM | POA: Diagnosis not present

## 2017-02-26 DIAGNOSIS — Z452 Encounter for adjustment and management of vascular access device: Secondary | ICD-10-CM | POA: Diagnosis not present

## 2017-02-26 DIAGNOSIS — G8929 Other chronic pain: Secondary | ICD-10-CM | POA: Diagnosis not present

## 2017-02-26 DIAGNOSIS — I10 Essential (primary) hypertension: Secondary | ICD-10-CM | POA: Diagnosis not present

## 2017-02-26 DIAGNOSIS — B182 Chronic viral hepatitis C: Secondary | ICD-10-CM | POA: Diagnosis not present

## 2017-02-26 DIAGNOSIS — J449 Chronic obstructive pulmonary disease, unspecified: Secondary | ICD-10-CM | POA: Diagnosis not present

## 2017-02-27 DIAGNOSIS — Z452 Encounter for adjustment and management of vascular access device: Secondary | ICD-10-CM | POA: Diagnosis not present

## 2017-02-27 DIAGNOSIS — I10 Essential (primary) hypertension: Secondary | ICD-10-CM | POA: Diagnosis not present

## 2017-02-27 DIAGNOSIS — J449 Chronic obstructive pulmonary disease, unspecified: Secondary | ICD-10-CM | POA: Diagnosis not present

## 2017-02-27 DIAGNOSIS — C9002 Multiple myeloma in relapse: Secondary | ICD-10-CM | POA: Diagnosis not present

## 2017-02-27 DIAGNOSIS — B182 Chronic viral hepatitis C: Secondary | ICD-10-CM | POA: Diagnosis not present

## 2017-02-27 DIAGNOSIS — G8929 Other chronic pain: Secondary | ICD-10-CM | POA: Diagnosis not present

## 2017-02-28 DIAGNOSIS — I10 Essential (primary) hypertension: Secondary | ICD-10-CM | POA: Diagnosis not present

## 2017-02-28 DIAGNOSIS — Z452 Encounter for adjustment and management of vascular access device: Secondary | ICD-10-CM | POA: Diagnosis not present

## 2017-02-28 DIAGNOSIS — B182 Chronic viral hepatitis C: Secondary | ICD-10-CM | POA: Diagnosis not present

## 2017-02-28 DIAGNOSIS — C9002 Multiple myeloma in relapse: Secondary | ICD-10-CM | POA: Diagnosis not present

## 2017-02-28 DIAGNOSIS — G8929 Other chronic pain: Secondary | ICD-10-CM | POA: Diagnosis not present

## 2017-02-28 DIAGNOSIS — J449 Chronic obstructive pulmonary disease, unspecified: Secondary | ICD-10-CM | POA: Diagnosis not present

## 2017-03-01 DIAGNOSIS — B182 Chronic viral hepatitis C: Secondary | ICD-10-CM | POA: Diagnosis not present

## 2017-03-01 DIAGNOSIS — J449 Chronic obstructive pulmonary disease, unspecified: Secondary | ICD-10-CM | POA: Diagnosis not present

## 2017-03-01 DIAGNOSIS — Z452 Encounter for adjustment and management of vascular access device: Secondary | ICD-10-CM | POA: Diagnosis not present

## 2017-03-01 DIAGNOSIS — C9002 Multiple myeloma in relapse: Secondary | ICD-10-CM | POA: Diagnosis not present

## 2017-03-01 DIAGNOSIS — I10 Essential (primary) hypertension: Secondary | ICD-10-CM | POA: Diagnosis not present

## 2017-03-01 DIAGNOSIS — G8929 Other chronic pain: Secondary | ICD-10-CM | POA: Diagnosis not present

## 2017-03-02 ENCOUNTER — Other Ambulatory Visit: Payer: Self-pay | Admitting: *Deleted

## 2017-03-02 DIAGNOSIS — Z452 Encounter for adjustment and management of vascular access device: Secondary | ICD-10-CM | POA: Diagnosis not present

## 2017-03-02 DIAGNOSIS — G8929 Other chronic pain: Secondary | ICD-10-CM | POA: Diagnosis not present

## 2017-03-02 DIAGNOSIS — B182 Chronic viral hepatitis C: Secondary | ICD-10-CM | POA: Diagnosis not present

## 2017-03-02 DIAGNOSIS — J449 Chronic obstructive pulmonary disease, unspecified: Secondary | ICD-10-CM | POA: Diagnosis not present

## 2017-03-02 DIAGNOSIS — C9002 Multiple myeloma in relapse: Secondary | ICD-10-CM | POA: Diagnosis not present

## 2017-03-02 DIAGNOSIS — I10 Essential (primary) hypertension: Secondary | ICD-10-CM | POA: Diagnosis not present

## 2017-03-02 DIAGNOSIS — Z720 Tobacco use: Secondary | ICD-10-CM | POA: Diagnosis not present

## 2017-03-02 MED ORDER — ACYCLOVIR 400 MG PO TABS: 400.0000 mg | ORAL_TABLET | Freq: Every day | ORAL | 2 refills | Status: AC

## 2017-03-03 ENCOUNTER — Telehealth: Payer: Self-pay | Admitting: Family Medicine

## 2017-03-03 DIAGNOSIS — G8929 Other chronic pain: Secondary | ICD-10-CM | POA: Diagnosis not present

## 2017-03-03 DIAGNOSIS — Z452 Encounter for adjustment and management of vascular access device: Secondary | ICD-10-CM | POA: Diagnosis not present

## 2017-03-03 DIAGNOSIS — K759 Inflammatory liver disease, unspecified: Secondary | ICD-10-CM | POA: Diagnosis not present

## 2017-03-03 DIAGNOSIS — C801 Malignant (primary) neoplasm, unspecified: Secondary | ICD-10-CM | POA: Diagnosis not present

## 2017-03-03 DIAGNOSIS — B182 Chronic viral hepatitis C: Secondary | ICD-10-CM | POA: Diagnosis not present

## 2017-03-03 DIAGNOSIS — I10 Essential (primary) hypertension: Secondary | ICD-10-CM | POA: Diagnosis not present

## 2017-03-03 DIAGNOSIS — C9002 Multiple myeloma in relapse: Secondary | ICD-10-CM | POA: Diagnosis not present

## 2017-03-03 DIAGNOSIS — J449 Chronic obstructive pulmonary disease, unspecified: Secondary | ICD-10-CM | POA: Diagnosis not present

## 2017-03-03 NOTE — Telephone Encounter (Signed)
Called and spoke to [place] who verbalized understanding

## 2017-03-03 NOTE — Telephone Encounter (Signed)
° ° °  Carol with Hospice call to ask for an  order for Dr Yong Channel to be her attending,and to continue hospice and sympton management  Pt is disharging home this morning so they need a call back asap    [facility]

## 2017-03-03 NOTE — Telephone Encounter (Signed)
Yes thanks, happy to be that for her

## 2017-03-05 DIAGNOSIS — G8929 Other chronic pain: Secondary | ICD-10-CM | POA: Diagnosis not present

## 2017-03-05 DIAGNOSIS — C9002 Multiple myeloma in relapse: Secondary | ICD-10-CM | POA: Diagnosis not present

## 2017-03-05 DIAGNOSIS — B182 Chronic viral hepatitis C: Secondary | ICD-10-CM | POA: Diagnosis not present

## 2017-03-05 DIAGNOSIS — J449 Chronic obstructive pulmonary disease, unspecified: Secondary | ICD-10-CM | POA: Diagnosis not present

## 2017-03-05 DIAGNOSIS — Z452 Encounter for adjustment and management of vascular access device: Secondary | ICD-10-CM | POA: Diagnosis not present

## 2017-03-05 DIAGNOSIS — I10 Essential (primary) hypertension: Secondary | ICD-10-CM | POA: Diagnosis not present

## 2017-03-07 DIAGNOSIS — J449 Chronic obstructive pulmonary disease, unspecified: Secondary | ICD-10-CM | POA: Diagnosis not present

## 2017-03-07 DIAGNOSIS — I10 Essential (primary) hypertension: Secondary | ICD-10-CM | POA: Diagnosis not present

## 2017-03-07 DIAGNOSIS — Z452 Encounter for adjustment and management of vascular access device: Secondary | ICD-10-CM | POA: Diagnosis not present

## 2017-03-07 DIAGNOSIS — G8929 Other chronic pain: Secondary | ICD-10-CM | POA: Diagnosis not present

## 2017-03-07 DIAGNOSIS — B182 Chronic viral hepatitis C: Secondary | ICD-10-CM | POA: Diagnosis not present

## 2017-03-07 DIAGNOSIS — C9002 Multiple myeloma in relapse: Secondary | ICD-10-CM | POA: Diagnosis not present

## 2017-03-09 DIAGNOSIS — J449 Chronic obstructive pulmonary disease, unspecified: Secondary | ICD-10-CM | POA: Diagnosis not present

## 2017-03-09 DIAGNOSIS — G8929 Other chronic pain: Secondary | ICD-10-CM | POA: Diagnosis not present

## 2017-03-09 DIAGNOSIS — C9002 Multiple myeloma in relapse: Secondary | ICD-10-CM | POA: Diagnosis not present

## 2017-03-09 DIAGNOSIS — B182 Chronic viral hepatitis C: Secondary | ICD-10-CM | POA: Diagnosis not present

## 2017-03-09 DIAGNOSIS — I10 Essential (primary) hypertension: Secondary | ICD-10-CM | POA: Diagnosis not present

## 2017-03-09 DIAGNOSIS — Z452 Encounter for adjustment and management of vascular access device: Secondary | ICD-10-CM | POA: Diagnosis not present

## 2017-03-13 DIAGNOSIS — C9002 Multiple myeloma in relapse: Secondary | ICD-10-CM | POA: Diagnosis not present

## 2017-03-13 DIAGNOSIS — G8929 Other chronic pain: Secondary | ICD-10-CM | POA: Diagnosis not present

## 2017-03-13 DIAGNOSIS — J449 Chronic obstructive pulmonary disease, unspecified: Secondary | ICD-10-CM | POA: Diagnosis not present

## 2017-03-13 DIAGNOSIS — Z452 Encounter for adjustment and management of vascular access device: Secondary | ICD-10-CM | POA: Diagnosis not present

## 2017-03-13 DIAGNOSIS — B182 Chronic viral hepatitis C: Secondary | ICD-10-CM | POA: Diagnosis not present

## 2017-03-13 DIAGNOSIS — I10 Essential (primary) hypertension: Secondary | ICD-10-CM | POA: Diagnosis not present

## 2017-03-16 DIAGNOSIS — J449 Chronic obstructive pulmonary disease, unspecified: Secondary | ICD-10-CM | POA: Diagnosis not present

## 2017-03-16 DIAGNOSIS — Z452 Encounter for adjustment and management of vascular access device: Secondary | ICD-10-CM | POA: Diagnosis not present

## 2017-03-16 DIAGNOSIS — C9002 Multiple myeloma in relapse: Secondary | ICD-10-CM | POA: Diagnosis not present

## 2017-03-16 DIAGNOSIS — G8929 Other chronic pain: Secondary | ICD-10-CM | POA: Diagnosis not present

## 2017-03-16 DIAGNOSIS — B182 Chronic viral hepatitis C: Secondary | ICD-10-CM | POA: Diagnosis not present

## 2017-03-16 DIAGNOSIS — I10 Essential (primary) hypertension: Secondary | ICD-10-CM | POA: Diagnosis not present

## 2017-03-17 DIAGNOSIS — C9002 Multiple myeloma in relapse: Secondary | ICD-10-CM | POA: Diagnosis not present

## 2017-03-17 DIAGNOSIS — G8929 Other chronic pain: Secondary | ICD-10-CM | POA: Diagnosis not present

## 2017-03-17 DIAGNOSIS — I10 Essential (primary) hypertension: Secondary | ICD-10-CM | POA: Diagnosis not present

## 2017-03-17 DIAGNOSIS — Z452 Encounter for adjustment and management of vascular access device: Secondary | ICD-10-CM | POA: Diagnosis not present

## 2017-03-17 DIAGNOSIS — B182 Chronic viral hepatitis C: Secondary | ICD-10-CM | POA: Diagnosis not present

## 2017-03-17 DIAGNOSIS — J449 Chronic obstructive pulmonary disease, unspecified: Secondary | ICD-10-CM | POA: Diagnosis not present

## 2017-03-18 ENCOUNTER — Ambulatory Visit: Payer: Self-pay | Admitting: Internal Medicine

## 2017-03-19 DIAGNOSIS — G8929 Other chronic pain: Secondary | ICD-10-CM | POA: Diagnosis not present

## 2017-03-19 DIAGNOSIS — C9002 Multiple myeloma in relapse: Secondary | ICD-10-CM | POA: Diagnosis not present

## 2017-03-19 DIAGNOSIS — B182 Chronic viral hepatitis C: Secondary | ICD-10-CM | POA: Diagnosis not present

## 2017-03-19 DIAGNOSIS — J449 Chronic obstructive pulmonary disease, unspecified: Secondary | ICD-10-CM | POA: Diagnosis not present

## 2017-03-19 DIAGNOSIS — I10 Essential (primary) hypertension: Secondary | ICD-10-CM | POA: Diagnosis not present

## 2017-03-19 DIAGNOSIS — Z452 Encounter for adjustment and management of vascular access device: Secondary | ICD-10-CM | POA: Diagnosis not present

## 2017-03-23 DIAGNOSIS — C9002 Multiple myeloma in relapse: Secondary | ICD-10-CM | POA: Diagnosis not present

## 2017-03-23 DIAGNOSIS — I10 Essential (primary) hypertension: Secondary | ICD-10-CM | POA: Diagnosis not present

## 2017-03-23 DIAGNOSIS — J449 Chronic obstructive pulmonary disease, unspecified: Secondary | ICD-10-CM | POA: Diagnosis not present

## 2017-03-23 DIAGNOSIS — B182 Chronic viral hepatitis C: Secondary | ICD-10-CM | POA: Diagnosis not present

## 2017-03-23 DIAGNOSIS — G8929 Other chronic pain: Secondary | ICD-10-CM | POA: Diagnosis not present

## 2017-03-23 DIAGNOSIS — Z452 Encounter for adjustment and management of vascular access device: Secondary | ICD-10-CM | POA: Diagnosis not present

## 2017-03-24 DIAGNOSIS — I10 Essential (primary) hypertension: Secondary | ICD-10-CM | POA: Diagnosis not present

## 2017-03-24 DIAGNOSIS — G8929 Other chronic pain: Secondary | ICD-10-CM | POA: Diagnosis not present

## 2017-03-24 DIAGNOSIS — C9002 Multiple myeloma in relapse: Secondary | ICD-10-CM | POA: Diagnosis not present

## 2017-03-24 DIAGNOSIS — Z452 Encounter for adjustment and management of vascular access device: Secondary | ICD-10-CM | POA: Diagnosis not present

## 2017-03-24 DIAGNOSIS — B182 Chronic viral hepatitis C: Secondary | ICD-10-CM | POA: Diagnosis not present

## 2017-03-24 DIAGNOSIS — J449 Chronic obstructive pulmonary disease, unspecified: Secondary | ICD-10-CM | POA: Diagnosis not present

## 2017-03-27 DIAGNOSIS — B182 Chronic viral hepatitis C: Secondary | ICD-10-CM | POA: Diagnosis not present

## 2017-03-27 DIAGNOSIS — Z452 Encounter for adjustment and management of vascular access device: Secondary | ICD-10-CM | POA: Diagnosis not present

## 2017-03-27 DIAGNOSIS — G8929 Other chronic pain: Secondary | ICD-10-CM | POA: Diagnosis not present

## 2017-03-27 DIAGNOSIS — C9002 Multiple myeloma in relapse: Secondary | ICD-10-CM | POA: Diagnosis not present

## 2017-03-27 DIAGNOSIS — I10 Essential (primary) hypertension: Secondary | ICD-10-CM | POA: Diagnosis not present

## 2017-03-27 DIAGNOSIS — J449 Chronic obstructive pulmonary disease, unspecified: Secondary | ICD-10-CM | POA: Diagnosis not present

## 2017-03-29 DIAGNOSIS — J449 Chronic obstructive pulmonary disease, unspecified: Secondary | ICD-10-CM | POA: Diagnosis not present

## 2017-03-29 DIAGNOSIS — I10 Essential (primary) hypertension: Secondary | ICD-10-CM | POA: Diagnosis not present

## 2017-03-29 DIAGNOSIS — C9002 Multiple myeloma in relapse: Secondary | ICD-10-CM | POA: Diagnosis not present

## 2017-03-29 DIAGNOSIS — G8929 Other chronic pain: Secondary | ICD-10-CM | POA: Diagnosis not present

## 2017-03-29 DIAGNOSIS — Z452 Encounter for adjustment and management of vascular access device: Secondary | ICD-10-CM | POA: Diagnosis not present

## 2017-03-29 DIAGNOSIS — B182 Chronic viral hepatitis C: Secondary | ICD-10-CM | POA: Diagnosis not present

## 2017-03-30 DIAGNOSIS — B182 Chronic viral hepatitis C: Secondary | ICD-10-CM | POA: Diagnosis not present

## 2017-03-30 DIAGNOSIS — J449 Chronic obstructive pulmonary disease, unspecified: Secondary | ICD-10-CM | POA: Diagnosis not present

## 2017-03-30 DIAGNOSIS — I10 Essential (primary) hypertension: Secondary | ICD-10-CM | POA: Diagnosis not present

## 2017-03-30 DIAGNOSIS — Z452 Encounter for adjustment and management of vascular access device: Secondary | ICD-10-CM | POA: Diagnosis not present

## 2017-03-30 DIAGNOSIS — C9002 Multiple myeloma in relapse: Secondary | ICD-10-CM | POA: Diagnosis not present

## 2017-03-30 DIAGNOSIS — G8929 Other chronic pain: Secondary | ICD-10-CM | POA: Diagnosis not present

## 2017-03-31 DIAGNOSIS — C9002 Multiple myeloma in relapse: Secondary | ICD-10-CM | POA: Diagnosis not present

## 2017-03-31 DIAGNOSIS — Z452 Encounter for adjustment and management of vascular access device: Secondary | ICD-10-CM | POA: Diagnosis not present

## 2017-03-31 DIAGNOSIS — I10 Essential (primary) hypertension: Secondary | ICD-10-CM | POA: Diagnosis not present

## 2017-03-31 DIAGNOSIS — J449 Chronic obstructive pulmonary disease, unspecified: Secondary | ICD-10-CM | POA: Diagnosis not present

## 2017-03-31 DIAGNOSIS — G8929 Other chronic pain: Secondary | ICD-10-CM | POA: Diagnosis not present

## 2017-03-31 DIAGNOSIS — B182 Chronic viral hepatitis C: Secondary | ICD-10-CM | POA: Diagnosis not present

## 2017-04-02 DIAGNOSIS — C9002 Multiple myeloma in relapse: Secondary | ICD-10-CM | POA: Diagnosis not present

## 2017-04-02 DIAGNOSIS — I10 Essential (primary) hypertension: Secondary | ICD-10-CM | POA: Diagnosis not present

## 2017-04-02 DIAGNOSIS — B182 Chronic viral hepatitis C: Secondary | ICD-10-CM | POA: Diagnosis not present

## 2017-04-02 DIAGNOSIS — Z720 Tobacco use: Secondary | ICD-10-CM | POA: Diagnosis not present

## 2017-04-02 DIAGNOSIS — Z452 Encounter for adjustment and management of vascular access device: Secondary | ICD-10-CM | POA: Diagnosis not present

## 2017-04-02 DIAGNOSIS — G8929 Other chronic pain: Secondary | ICD-10-CM | POA: Diagnosis not present

## 2017-04-02 DIAGNOSIS — J449 Chronic obstructive pulmonary disease, unspecified: Secondary | ICD-10-CM | POA: Diagnosis not present

## 2017-04-05 DIAGNOSIS — J449 Chronic obstructive pulmonary disease, unspecified: Secondary | ICD-10-CM | POA: Diagnosis not present

## 2017-04-05 DIAGNOSIS — Z452 Encounter for adjustment and management of vascular access device: Secondary | ICD-10-CM | POA: Diagnosis not present

## 2017-04-05 DIAGNOSIS — C9002 Multiple myeloma in relapse: Secondary | ICD-10-CM | POA: Diagnosis not present

## 2017-04-05 DIAGNOSIS — I10 Essential (primary) hypertension: Secondary | ICD-10-CM | POA: Diagnosis not present

## 2017-04-05 DIAGNOSIS — B182 Chronic viral hepatitis C: Secondary | ICD-10-CM | POA: Diagnosis not present

## 2017-04-05 DIAGNOSIS — G8929 Other chronic pain: Secondary | ICD-10-CM | POA: Diagnosis not present

## 2017-04-06 DIAGNOSIS — J449 Chronic obstructive pulmonary disease, unspecified: Secondary | ICD-10-CM | POA: Diagnosis not present

## 2017-04-06 DIAGNOSIS — C9002 Multiple myeloma in relapse: Secondary | ICD-10-CM | POA: Diagnosis not present

## 2017-04-06 DIAGNOSIS — G8929 Other chronic pain: Secondary | ICD-10-CM | POA: Diagnosis not present

## 2017-04-06 DIAGNOSIS — B182 Chronic viral hepatitis C: Secondary | ICD-10-CM | POA: Diagnosis not present

## 2017-04-06 DIAGNOSIS — I10 Essential (primary) hypertension: Secondary | ICD-10-CM | POA: Diagnosis not present

## 2017-04-06 DIAGNOSIS — Z452 Encounter for adjustment and management of vascular access device: Secondary | ICD-10-CM | POA: Diagnosis not present

## 2017-04-13 DIAGNOSIS — I10 Essential (primary) hypertension: Secondary | ICD-10-CM | POA: Diagnosis not present

## 2017-04-13 DIAGNOSIS — G8929 Other chronic pain: Secondary | ICD-10-CM | POA: Diagnosis not present

## 2017-04-13 DIAGNOSIS — Z452 Encounter for adjustment and management of vascular access device: Secondary | ICD-10-CM | POA: Diagnosis not present

## 2017-04-13 DIAGNOSIS — B182 Chronic viral hepatitis C: Secondary | ICD-10-CM | POA: Diagnosis not present

## 2017-04-13 DIAGNOSIS — C9002 Multiple myeloma in relapse: Secondary | ICD-10-CM | POA: Diagnosis not present

## 2017-04-13 DIAGNOSIS — J449 Chronic obstructive pulmonary disease, unspecified: Secondary | ICD-10-CM | POA: Diagnosis not present

## 2017-04-14 DIAGNOSIS — Z452 Encounter for adjustment and management of vascular access device: Secondary | ICD-10-CM | POA: Diagnosis not present

## 2017-04-14 DIAGNOSIS — I10 Essential (primary) hypertension: Secondary | ICD-10-CM | POA: Diagnosis not present

## 2017-04-14 DIAGNOSIS — J449 Chronic obstructive pulmonary disease, unspecified: Secondary | ICD-10-CM | POA: Diagnosis not present

## 2017-04-14 DIAGNOSIS — C9002 Multiple myeloma in relapse: Secondary | ICD-10-CM | POA: Diagnosis not present

## 2017-04-14 DIAGNOSIS — B182 Chronic viral hepatitis C: Secondary | ICD-10-CM | POA: Diagnosis not present

## 2017-04-14 DIAGNOSIS — G8929 Other chronic pain: Secondary | ICD-10-CM | POA: Diagnosis not present

## 2017-04-16 DIAGNOSIS — Z452 Encounter for adjustment and management of vascular access device: Secondary | ICD-10-CM | POA: Diagnosis not present

## 2017-04-16 DIAGNOSIS — B182 Chronic viral hepatitis C: Secondary | ICD-10-CM | POA: Diagnosis not present

## 2017-04-16 DIAGNOSIS — C9002 Multiple myeloma in relapse: Secondary | ICD-10-CM | POA: Diagnosis not present

## 2017-04-16 DIAGNOSIS — I10 Essential (primary) hypertension: Secondary | ICD-10-CM | POA: Diagnosis not present

## 2017-04-16 DIAGNOSIS — G8929 Other chronic pain: Secondary | ICD-10-CM | POA: Diagnosis not present

## 2017-04-16 DIAGNOSIS — J449 Chronic obstructive pulmonary disease, unspecified: Secondary | ICD-10-CM | POA: Diagnosis not present

## 2017-04-18 DIAGNOSIS — I10 Essential (primary) hypertension: Secondary | ICD-10-CM | POA: Diagnosis not present

## 2017-04-18 DIAGNOSIS — J449 Chronic obstructive pulmonary disease, unspecified: Secondary | ICD-10-CM | POA: Diagnosis not present

## 2017-04-18 DIAGNOSIS — B182 Chronic viral hepatitis C: Secondary | ICD-10-CM | POA: Diagnosis not present

## 2017-04-18 DIAGNOSIS — C9002 Multiple myeloma in relapse: Secondary | ICD-10-CM | POA: Diagnosis not present

## 2017-04-18 DIAGNOSIS — Z452 Encounter for adjustment and management of vascular access device: Secondary | ICD-10-CM | POA: Diagnosis not present

## 2017-04-18 DIAGNOSIS — G8929 Other chronic pain: Secondary | ICD-10-CM | POA: Diagnosis not present

## 2017-04-19 DIAGNOSIS — I10 Essential (primary) hypertension: Secondary | ICD-10-CM | POA: Diagnosis not present

## 2017-04-19 DIAGNOSIS — C9002 Multiple myeloma in relapse: Secondary | ICD-10-CM | POA: Diagnosis not present

## 2017-04-19 DIAGNOSIS — B182 Chronic viral hepatitis C: Secondary | ICD-10-CM | POA: Diagnosis not present

## 2017-04-19 DIAGNOSIS — Z452 Encounter for adjustment and management of vascular access device: Secondary | ICD-10-CM | POA: Diagnosis not present

## 2017-04-19 DIAGNOSIS — G8929 Other chronic pain: Secondary | ICD-10-CM | POA: Diagnosis not present

## 2017-04-19 DIAGNOSIS — J449 Chronic obstructive pulmonary disease, unspecified: Secondary | ICD-10-CM | POA: Diagnosis not present

## 2017-04-20 DIAGNOSIS — J449 Chronic obstructive pulmonary disease, unspecified: Secondary | ICD-10-CM | POA: Diagnosis not present

## 2017-04-20 DIAGNOSIS — B182 Chronic viral hepatitis C: Secondary | ICD-10-CM | POA: Diagnosis not present

## 2017-04-20 DIAGNOSIS — C9002 Multiple myeloma in relapse: Secondary | ICD-10-CM | POA: Diagnosis not present

## 2017-04-20 DIAGNOSIS — G8929 Other chronic pain: Secondary | ICD-10-CM | POA: Diagnosis not present

## 2017-04-20 DIAGNOSIS — Z452 Encounter for adjustment and management of vascular access device: Secondary | ICD-10-CM | POA: Diagnosis not present

## 2017-04-20 DIAGNOSIS — I10 Essential (primary) hypertension: Secondary | ICD-10-CM | POA: Diagnosis not present

## 2017-04-21 DIAGNOSIS — G8929 Other chronic pain: Secondary | ICD-10-CM | POA: Diagnosis not present

## 2017-04-21 DIAGNOSIS — I10 Essential (primary) hypertension: Secondary | ICD-10-CM | POA: Diagnosis not present

## 2017-04-21 DIAGNOSIS — C9002 Multiple myeloma in relapse: Secondary | ICD-10-CM | POA: Diagnosis not present

## 2017-04-21 DIAGNOSIS — J449 Chronic obstructive pulmonary disease, unspecified: Secondary | ICD-10-CM | POA: Diagnosis not present

## 2017-04-21 DIAGNOSIS — Z452 Encounter for adjustment and management of vascular access device: Secondary | ICD-10-CM | POA: Diagnosis not present

## 2017-04-21 DIAGNOSIS — B182 Chronic viral hepatitis C: Secondary | ICD-10-CM | POA: Diagnosis not present

## 2017-04-22 DIAGNOSIS — Z452 Encounter for adjustment and management of vascular access device: Secondary | ICD-10-CM | POA: Diagnosis not present

## 2017-04-22 DIAGNOSIS — C9002 Multiple myeloma in relapse: Secondary | ICD-10-CM | POA: Diagnosis not present

## 2017-04-22 DIAGNOSIS — G8929 Other chronic pain: Secondary | ICD-10-CM | POA: Diagnosis not present

## 2017-04-22 DIAGNOSIS — I10 Essential (primary) hypertension: Secondary | ICD-10-CM | POA: Diagnosis not present

## 2017-04-22 DIAGNOSIS — B182 Chronic viral hepatitis C: Secondary | ICD-10-CM | POA: Diagnosis not present

## 2017-04-22 DIAGNOSIS — J449 Chronic obstructive pulmonary disease, unspecified: Secondary | ICD-10-CM | POA: Diagnosis not present

## 2017-04-23 DIAGNOSIS — C9002 Multiple myeloma in relapse: Secondary | ICD-10-CM | POA: Diagnosis not present

## 2017-04-23 DIAGNOSIS — B182 Chronic viral hepatitis C: Secondary | ICD-10-CM | POA: Diagnosis not present

## 2017-04-23 DIAGNOSIS — Z452 Encounter for adjustment and management of vascular access device: Secondary | ICD-10-CM | POA: Diagnosis not present

## 2017-04-23 DIAGNOSIS — J449 Chronic obstructive pulmonary disease, unspecified: Secondary | ICD-10-CM | POA: Diagnosis not present

## 2017-04-23 DIAGNOSIS — I10 Essential (primary) hypertension: Secondary | ICD-10-CM | POA: Diagnosis not present

## 2017-04-23 DIAGNOSIS — G8929 Other chronic pain: Secondary | ICD-10-CM | POA: Diagnosis not present

## 2017-04-24 DIAGNOSIS — I10 Essential (primary) hypertension: Secondary | ICD-10-CM | POA: Diagnosis not present

## 2017-04-24 DIAGNOSIS — C9002 Multiple myeloma in relapse: Secondary | ICD-10-CM | POA: Diagnosis not present

## 2017-04-24 DIAGNOSIS — B182 Chronic viral hepatitis C: Secondary | ICD-10-CM | POA: Diagnosis not present

## 2017-04-24 DIAGNOSIS — G8929 Other chronic pain: Secondary | ICD-10-CM | POA: Diagnosis not present

## 2017-04-24 DIAGNOSIS — Z452 Encounter for adjustment and management of vascular access device: Secondary | ICD-10-CM | POA: Diagnosis not present

## 2017-04-24 DIAGNOSIS — J449 Chronic obstructive pulmonary disease, unspecified: Secondary | ICD-10-CM | POA: Diagnosis not present

## 2017-04-26 DIAGNOSIS — C9002 Multiple myeloma in relapse: Secondary | ICD-10-CM | POA: Diagnosis not present

## 2017-04-26 DIAGNOSIS — B182 Chronic viral hepatitis C: Secondary | ICD-10-CM | POA: Diagnosis not present

## 2017-04-26 DIAGNOSIS — Z452 Encounter for adjustment and management of vascular access device: Secondary | ICD-10-CM | POA: Diagnosis not present

## 2017-04-26 DIAGNOSIS — J449 Chronic obstructive pulmonary disease, unspecified: Secondary | ICD-10-CM | POA: Diagnosis not present

## 2017-04-26 DIAGNOSIS — I10 Essential (primary) hypertension: Secondary | ICD-10-CM | POA: Diagnosis not present

## 2017-04-26 DIAGNOSIS — G8929 Other chronic pain: Secondary | ICD-10-CM | POA: Diagnosis not present

## 2017-04-27 ENCOUNTER — Telehealth: Payer: Self-pay | Admitting: Family Medicine

## 2017-04-27 DIAGNOSIS — Z452 Encounter for adjustment and management of vascular access device: Secondary | ICD-10-CM | POA: Diagnosis not present

## 2017-04-27 DIAGNOSIS — B182 Chronic viral hepatitis C: Secondary | ICD-10-CM | POA: Diagnosis not present

## 2017-04-27 DIAGNOSIS — C9002 Multiple myeloma in relapse: Secondary | ICD-10-CM | POA: Diagnosis not present

## 2017-04-27 DIAGNOSIS — J449 Chronic obstructive pulmonary disease, unspecified: Secondary | ICD-10-CM | POA: Diagnosis not present

## 2017-04-27 DIAGNOSIS — I10 Essential (primary) hypertension: Secondary | ICD-10-CM | POA: Diagnosis not present

## 2017-04-27 DIAGNOSIS — G8929 Other chronic pain: Secondary | ICD-10-CM | POA: Diagnosis not present

## 2017-04-27 NOTE — Telephone Encounter (Signed)
Kendra Johns from [facility] [phone]) needs verbal orders to continue Hospice services. They transferred her to a Hospice home in [facility].

## 2017-04-27 NOTE — Telephone Encounter (Signed)
Verbal order provided as requested. Left a detailed voicemail for [facility] with a call back number

## 2017-04-28 DIAGNOSIS — C9002 Multiple myeloma in relapse: Secondary | ICD-10-CM | POA: Diagnosis not present

## 2017-04-28 DIAGNOSIS — Z452 Encounter for adjustment and management of vascular access device: Secondary | ICD-10-CM | POA: Diagnosis not present

## 2017-04-28 DIAGNOSIS — B182 Chronic viral hepatitis C: Secondary | ICD-10-CM | POA: Diagnosis not present

## 2017-04-28 DIAGNOSIS — J449 Chronic obstructive pulmonary disease, unspecified: Secondary | ICD-10-CM | POA: Diagnosis not present

## 2017-04-28 DIAGNOSIS — G8929 Other chronic pain: Secondary | ICD-10-CM | POA: Diagnosis not present

## 2017-04-28 DIAGNOSIS — I10 Essential (primary) hypertension: Secondary | ICD-10-CM | POA: Diagnosis not present

## 2017-04-29 DIAGNOSIS — J449 Chronic obstructive pulmonary disease, unspecified: Secondary | ICD-10-CM | POA: Diagnosis not present

## 2017-04-29 DIAGNOSIS — B182 Chronic viral hepatitis C: Secondary | ICD-10-CM | POA: Diagnosis not present

## 2017-04-29 DIAGNOSIS — Z452 Encounter for adjustment and management of vascular access device: Secondary | ICD-10-CM | POA: Diagnosis not present

## 2017-04-29 DIAGNOSIS — G8929 Other chronic pain: Secondary | ICD-10-CM | POA: Diagnosis not present

## 2017-04-29 DIAGNOSIS — C9002 Multiple myeloma in relapse: Secondary | ICD-10-CM | POA: Diagnosis not present

## 2017-04-29 DIAGNOSIS — I10 Essential (primary) hypertension: Secondary | ICD-10-CM | POA: Diagnosis not present

## 2017-04-30 DIAGNOSIS — J449 Chronic obstructive pulmonary disease, unspecified: Secondary | ICD-10-CM | POA: Diagnosis not present

## 2017-04-30 DIAGNOSIS — Z452 Encounter for adjustment and management of vascular access device: Secondary | ICD-10-CM | POA: Diagnosis not present

## 2017-04-30 DIAGNOSIS — C9002 Multiple myeloma in relapse: Secondary | ICD-10-CM | POA: Diagnosis not present

## 2017-04-30 DIAGNOSIS — B182 Chronic viral hepatitis C: Secondary | ICD-10-CM | POA: Diagnosis not present

## 2017-04-30 DIAGNOSIS — I10 Essential (primary) hypertension: Secondary | ICD-10-CM | POA: Diagnosis not present

## 2017-04-30 DIAGNOSIS — G8929 Other chronic pain: Secondary | ICD-10-CM | POA: Diagnosis not present

## 2017-05-01 DIAGNOSIS — I10 Essential (primary) hypertension: Secondary | ICD-10-CM | POA: Diagnosis not present

## 2017-05-01 DIAGNOSIS — B182 Chronic viral hepatitis C: Secondary | ICD-10-CM | POA: Diagnosis not present

## 2017-05-01 DIAGNOSIS — G8929 Other chronic pain: Secondary | ICD-10-CM | POA: Diagnosis not present

## 2017-05-01 DIAGNOSIS — J449 Chronic obstructive pulmonary disease, unspecified: Secondary | ICD-10-CM | POA: Diagnosis not present

## 2017-05-01 DIAGNOSIS — Z452 Encounter for adjustment and management of vascular access device: Secondary | ICD-10-CM | POA: Diagnosis not present

## 2017-05-01 DIAGNOSIS — C9002 Multiple myeloma in relapse: Secondary | ICD-10-CM | POA: Diagnosis not present

## 2017-05-02 DIAGNOSIS — C9002 Multiple myeloma in relapse: Secondary | ICD-10-CM | POA: Diagnosis not present

## 2017-05-02 DIAGNOSIS — I10 Essential (primary) hypertension: Secondary | ICD-10-CM | POA: Diagnosis not present

## 2017-05-02 DIAGNOSIS — Z452 Encounter for adjustment and management of vascular access device: Secondary | ICD-10-CM | POA: Diagnosis not present

## 2017-05-02 DIAGNOSIS — Z720 Tobacco use: Secondary | ICD-10-CM | POA: Diagnosis not present

## 2017-05-02 DIAGNOSIS — B182 Chronic viral hepatitis C: Secondary | ICD-10-CM | POA: Diagnosis not present

## 2017-05-02 DIAGNOSIS — J449 Chronic obstructive pulmonary disease, unspecified: Secondary | ICD-10-CM | POA: Diagnosis not present

## 2017-05-02 DIAGNOSIS — G8929 Other chronic pain: Secondary | ICD-10-CM | POA: Diagnosis not present

## 2017-05-03 DIAGNOSIS — I10 Essential (primary) hypertension: Secondary | ICD-10-CM | POA: Diagnosis not present

## 2017-05-03 DIAGNOSIS — J449 Chronic obstructive pulmonary disease, unspecified: Secondary | ICD-10-CM | POA: Diagnosis not present

## 2017-05-03 DIAGNOSIS — B182 Chronic viral hepatitis C: Secondary | ICD-10-CM | POA: Diagnosis not present

## 2017-05-03 DIAGNOSIS — Z452 Encounter for adjustment and management of vascular access device: Secondary | ICD-10-CM | POA: Diagnosis not present

## 2017-05-03 DIAGNOSIS — G8929 Other chronic pain: Secondary | ICD-10-CM | POA: Diagnosis not present

## 2017-05-03 DIAGNOSIS — C9002 Multiple myeloma in relapse: Secondary | ICD-10-CM | POA: Diagnosis not present

## 2017-05-04 DIAGNOSIS — Z452 Encounter for adjustment and management of vascular access device: Secondary | ICD-10-CM | POA: Diagnosis not present

## 2017-05-04 DIAGNOSIS — J449 Chronic obstructive pulmonary disease, unspecified: Secondary | ICD-10-CM | POA: Diagnosis not present

## 2017-05-04 DIAGNOSIS — B182 Chronic viral hepatitis C: Secondary | ICD-10-CM | POA: Diagnosis not present

## 2017-05-04 DIAGNOSIS — C9002 Multiple myeloma in relapse: Secondary | ICD-10-CM | POA: Diagnosis not present

## 2017-05-04 DIAGNOSIS — G8929 Other chronic pain: Secondary | ICD-10-CM | POA: Diagnosis not present

## 2017-05-04 DIAGNOSIS — I10 Essential (primary) hypertension: Secondary | ICD-10-CM | POA: Diagnosis not present

## 2017-05-05 DIAGNOSIS — G8929 Other chronic pain: Secondary | ICD-10-CM | POA: Diagnosis not present

## 2017-05-05 DIAGNOSIS — J449 Chronic obstructive pulmonary disease, unspecified: Secondary | ICD-10-CM | POA: Diagnosis not present

## 2017-05-05 DIAGNOSIS — I10 Essential (primary) hypertension: Secondary | ICD-10-CM | POA: Diagnosis not present

## 2017-05-05 DIAGNOSIS — B182 Chronic viral hepatitis C: Secondary | ICD-10-CM | POA: Diagnosis not present

## 2017-05-05 DIAGNOSIS — Z452 Encounter for adjustment and management of vascular access device: Secondary | ICD-10-CM | POA: Diagnosis not present

## 2017-05-05 DIAGNOSIS — C9002 Multiple myeloma in relapse: Secondary | ICD-10-CM | POA: Diagnosis not present

## 2017-05-06 ENCOUNTER — Telehealth: Payer: Self-pay

## 2017-05-06 DIAGNOSIS — B182 Chronic viral hepatitis C: Secondary | ICD-10-CM | POA: Diagnosis not present

## 2017-05-06 DIAGNOSIS — G8929 Other chronic pain: Secondary | ICD-10-CM | POA: Diagnosis not present

## 2017-05-06 DIAGNOSIS — C9002 Multiple myeloma in relapse: Secondary | ICD-10-CM | POA: Diagnosis not present

## 2017-05-06 DIAGNOSIS — Z452 Encounter for adjustment and management of vascular access device: Secondary | ICD-10-CM | POA: Diagnosis not present

## 2017-05-06 DIAGNOSIS — I10 Essential (primary) hypertension: Secondary | ICD-10-CM | POA: Diagnosis not present

## 2017-05-06 DIAGNOSIS — J449 Chronic obstructive pulmonary disease, unspecified: Secondary | ICD-10-CM | POA: Diagnosis not present

## 2017-05-06 NOTE — Telephone Encounter (Signed)
Spoke with [name] and gave verbal order for continued care. Nothing further needed at this time.

## 2017-05-06 NOTE — Telephone Encounter (Signed)
LMTCB for [name]

## 2017-05-06 NOTE — Telephone Encounter (Signed)
[name] @ Hospice needs re-cert orders on patient for continued care.  Dr. Yong Channel - Please advise. Thanks!

## 2017-05-06 NOTE — Telephone Encounter (Signed)
Yes thanks, may provide verbal order

## 2017-05-07 DIAGNOSIS — G8929 Other chronic pain: Secondary | ICD-10-CM | POA: Diagnosis not present

## 2017-05-07 DIAGNOSIS — C9002 Multiple myeloma in relapse: Secondary | ICD-10-CM | POA: Diagnosis not present

## 2017-05-07 DIAGNOSIS — I10 Essential (primary) hypertension: Secondary | ICD-10-CM | POA: Diagnosis not present

## 2017-05-07 DIAGNOSIS — Z452 Encounter for adjustment and management of vascular access device: Secondary | ICD-10-CM | POA: Diagnosis not present

## 2017-05-07 DIAGNOSIS — J449 Chronic obstructive pulmonary disease, unspecified: Secondary | ICD-10-CM | POA: Diagnosis not present

## 2017-05-07 DIAGNOSIS — B182 Chronic viral hepatitis C: Secondary | ICD-10-CM | POA: Diagnosis not present

## 2017-05-08 DIAGNOSIS — G8929 Other chronic pain: Secondary | ICD-10-CM | POA: Diagnosis not present

## 2017-05-08 DIAGNOSIS — B182 Chronic viral hepatitis C: Secondary | ICD-10-CM | POA: Diagnosis not present

## 2017-05-08 DIAGNOSIS — J449 Chronic obstructive pulmonary disease, unspecified: Secondary | ICD-10-CM | POA: Diagnosis not present

## 2017-05-08 DIAGNOSIS — C9002 Multiple myeloma in relapse: Secondary | ICD-10-CM | POA: Diagnosis not present

## 2017-05-08 DIAGNOSIS — I10 Essential (primary) hypertension: Secondary | ICD-10-CM | POA: Diagnosis not present

## 2017-05-08 DIAGNOSIS — Z452 Encounter for adjustment and management of vascular access device: Secondary | ICD-10-CM | POA: Diagnosis not present

## 2017-05-09 DIAGNOSIS — Z452 Encounter for adjustment and management of vascular access device: Secondary | ICD-10-CM | POA: Diagnosis not present

## 2017-05-09 DIAGNOSIS — G8929 Other chronic pain: Secondary | ICD-10-CM | POA: Diagnosis not present

## 2017-05-09 DIAGNOSIS — C9002 Multiple myeloma in relapse: Secondary | ICD-10-CM | POA: Diagnosis not present

## 2017-05-09 DIAGNOSIS — I10 Essential (primary) hypertension: Secondary | ICD-10-CM | POA: Diagnosis not present

## 2017-05-09 DIAGNOSIS — J449 Chronic obstructive pulmonary disease, unspecified: Secondary | ICD-10-CM | POA: Diagnosis not present

## 2017-05-09 DIAGNOSIS — B182 Chronic viral hepatitis C: Secondary | ICD-10-CM | POA: Diagnosis not present

## 2017-05-11 DIAGNOSIS — B182 Chronic viral hepatitis C: Secondary | ICD-10-CM | POA: Diagnosis not present

## 2017-05-11 DIAGNOSIS — G8929 Other chronic pain: Secondary | ICD-10-CM | POA: Diagnosis not present

## 2017-05-11 DIAGNOSIS — I10 Essential (primary) hypertension: Secondary | ICD-10-CM | POA: Diagnosis not present

## 2017-05-11 DIAGNOSIS — C9002 Multiple myeloma in relapse: Secondary | ICD-10-CM | POA: Diagnosis not present

## 2017-05-11 DIAGNOSIS — Z452 Encounter for adjustment and management of vascular access device: Secondary | ICD-10-CM | POA: Diagnosis not present

## 2017-05-11 DIAGNOSIS — J449 Chronic obstructive pulmonary disease, unspecified: Secondary | ICD-10-CM | POA: Diagnosis not present

## 2017-05-13 DIAGNOSIS — B182 Chronic viral hepatitis C: Secondary | ICD-10-CM | POA: Diagnosis not present

## 2017-05-13 DIAGNOSIS — I10 Essential (primary) hypertension: Secondary | ICD-10-CM | POA: Diagnosis not present

## 2017-05-13 DIAGNOSIS — J449 Chronic obstructive pulmonary disease, unspecified: Secondary | ICD-10-CM | POA: Diagnosis not present

## 2017-05-13 DIAGNOSIS — G8929 Other chronic pain: Secondary | ICD-10-CM | POA: Diagnosis not present

## 2017-05-13 DIAGNOSIS — Z452 Encounter for adjustment and management of vascular access device: Secondary | ICD-10-CM | POA: Diagnosis not present

## 2017-05-13 DIAGNOSIS — C9002 Multiple myeloma in relapse: Secondary | ICD-10-CM | POA: Diagnosis not present

## 2017-05-14 DIAGNOSIS — B182 Chronic viral hepatitis C: Secondary | ICD-10-CM | POA: Diagnosis not present

## 2017-05-14 DIAGNOSIS — C9002 Multiple myeloma in relapse: Secondary | ICD-10-CM | POA: Diagnosis not present

## 2017-05-14 DIAGNOSIS — J449 Chronic obstructive pulmonary disease, unspecified: Secondary | ICD-10-CM | POA: Diagnosis not present

## 2017-05-14 DIAGNOSIS — Z452 Encounter for adjustment and management of vascular access device: Secondary | ICD-10-CM | POA: Diagnosis not present

## 2017-05-14 DIAGNOSIS — G8929 Other chronic pain: Secondary | ICD-10-CM | POA: Diagnosis not present

## 2017-05-14 DIAGNOSIS — I10 Essential (primary) hypertension: Secondary | ICD-10-CM | POA: Diagnosis not present

## 2017-05-15 DIAGNOSIS — Z452 Encounter for adjustment and management of vascular access device: Secondary | ICD-10-CM | POA: Diagnosis not present

## 2017-05-15 DIAGNOSIS — B182 Chronic viral hepatitis C: Secondary | ICD-10-CM | POA: Diagnosis not present

## 2017-05-15 DIAGNOSIS — J449 Chronic obstructive pulmonary disease, unspecified: Secondary | ICD-10-CM | POA: Diagnosis not present

## 2017-05-15 DIAGNOSIS — I10 Essential (primary) hypertension: Secondary | ICD-10-CM | POA: Diagnosis not present

## 2017-05-15 DIAGNOSIS — G8929 Other chronic pain: Secondary | ICD-10-CM | POA: Diagnosis not present

## 2017-05-15 DIAGNOSIS — C9002 Multiple myeloma in relapse: Secondary | ICD-10-CM | POA: Diagnosis not present

## 2017-05-16 DIAGNOSIS — C9002 Multiple myeloma in relapse: Secondary | ICD-10-CM | POA: Diagnosis not present

## 2017-05-16 DIAGNOSIS — B182 Chronic viral hepatitis C: Secondary | ICD-10-CM | POA: Diagnosis not present

## 2017-05-16 DIAGNOSIS — Z452 Encounter for adjustment and management of vascular access device: Secondary | ICD-10-CM | POA: Diagnosis not present

## 2017-05-16 DIAGNOSIS — J449 Chronic obstructive pulmonary disease, unspecified: Secondary | ICD-10-CM | POA: Diagnosis not present

## 2017-05-16 DIAGNOSIS — I10 Essential (primary) hypertension: Secondary | ICD-10-CM | POA: Diagnosis not present

## 2017-05-16 DIAGNOSIS — G8929 Other chronic pain: Secondary | ICD-10-CM | POA: Diagnosis not present

## 2017-05-18 DIAGNOSIS — Z452 Encounter for adjustment and management of vascular access device: Secondary | ICD-10-CM | POA: Diagnosis not present

## 2017-05-18 DIAGNOSIS — I10 Essential (primary) hypertension: Secondary | ICD-10-CM | POA: Diagnosis not present

## 2017-05-18 DIAGNOSIS — J449 Chronic obstructive pulmonary disease, unspecified: Secondary | ICD-10-CM | POA: Diagnosis not present

## 2017-05-18 DIAGNOSIS — B182 Chronic viral hepatitis C: Secondary | ICD-10-CM | POA: Diagnosis not present

## 2017-05-18 DIAGNOSIS — C9002 Multiple myeloma in relapse: Secondary | ICD-10-CM | POA: Diagnosis not present

## 2017-05-18 DIAGNOSIS — G8929 Other chronic pain: Secondary | ICD-10-CM | POA: Diagnosis not present

## 2017-05-19 DIAGNOSIS — Z452 Encounter for adjustment and management of vascular access device: Secondary | ICD-10-CM | POA: Diagnosis not present

## 2017-05-19 DIAGNOSIS — G8929 Other chronic pain: Secondary | ICD-10-CM | POA: Diagnosis not present

## 2017-05-19 DIAGNOSIS — C9002 Multiple myeloma in relapse: Secondary | ICD-10-CM | POA: Diagnosis not present

## 2017-05-19 DIAGNOSIS — B182 Chronic viral hepatitis C: Secondary | ICD-10-CM | POA: Diagnosis not present

## 2017-05-19 DIAGNOSIS — I10 Essential (primary) hypertension: Secondary | ICD-10-CM | POA: Diagnosis not present

## 2017-05-19 DIAGNOSIS — J449 Chronic obstructive pulmonary disease, unspecified: Secondary | ICD-10-CM | POA: Diagnosis not present

## 2017-05-21 DIAGNOSIS — B182 Chronic viral hepatitis C: Secondary | ICD-10-CM | POA: Diagnosis not present

## 2017-05-21 DIAGNOSIS — C9002 Multiple myeloma in relapse: Secondary | ICD-10-CM | POA: Diagnosis not present

## 2017-05-21 DIAGNOSIS — G8929 Other chronic pain: Secondary | ICD-10-CM | POA: Diagnosis not present

## 2017-05-21 DIAGNOSIS — Z452 Encounter for adjustment and management of vascular access device: Secondary | ICD-10-CM | POA: Diagnosis not present

## 2017-05-21 DIAGNOSIS — I10 Essential (primary) hypertension: Secondary | ICD-10-CM | POA: Diagnosis not present

## 2017-05-21 DIAGNOSIS — J449 Chronic obstructive pulmonary disease, unspecified: Secondary | ICD-10-CM | POA: Diagnosis not present

## 2017-05-22 DIAGNOSIS — G8929 Other chronic pain: Secondary | ICD-10-CM | POA: Diagnosis not present

## 2017-05-22 DIAGNOSIS — C9002 Multiple myeloma in relapse: Secondary | ICD-10-CM | POA: Diagnosis not present

## 2017-05-22 DIAGNOSIS — B182 Chronic viral hepatitis C: Secondary | ICD-10-CM | POA: Diagnosis not present

## 2017-05-22 DIAGNOSIS — J449 Chronic obstructive pulmonary disease, unspecified: Secondary | ICD-10-CM | POA: Diagnosis not present

## 2017-05-22 DIAGNOSIS — I10 Essential (primary) hypertension: Secondary | ICD-10-CM | POA: Diagnosis not present

## 2017-05-22 DIAGNOSIS — Z452 Encounter for adjustment and management of vascular access device: Secondary | ICD-10-CM | POA: Diagnosis not present

## 2017-05-23 DIAGNOSIS — J449 Chronic obstructive pulmonary disease, unspecified: Secondary | ICD-10-CM | POA: Diagnosis not present

## 2017-05-23 DIAGNOSIS — Z452 Encounter for adjustment and management of vascular access device: Secondary | ICD-10-CM | POA: Diagnosis not present

## 2017-05-23 DIAGNOSIS — C9002 Multiple myeloma in relapse: Secondary | ICD-10-CM | POA: Diagnosis not present

## 2017-05-23 DIAGNOSIS — G8929 Other chronic pain: Secondary | ICD-10-CM | POA: Diagnosis not present

## 2017-05-23 DIAGNOSIS — B182 Chronic viral hepatitis C: Secondary | ICD-10-CM | POA: Diagnosis not present

## 2017-05-23 DIAGNOSIS — I10 Essential (primary) hypertension: Secondary | ICD-10-CM | POA: Diagnosis not present

## 2017-05-24 DIAGNOSIS — Z452 Encounter for adjustment and management of vascular access device: Secondary | ICD-10-CM | POA: Diagnosis not present

## 2017-05-24 DIAGNOSIS — B182 Chronic viral hepatitis C: Secondary | ICD-10-CM | POA: Diagnosis not present

## 2017-05-24 DIAGNOSIS — I10 Essential (primary) hypertension: Secondary | ICD-10-CM | POA: Diagnosis not present

## 2017-05-24 DIAGNOSIS — J449 Chronic obstructive pulmonary disease, unspecified: Secondary | ICD-10-CM | POA: Diagnosis not present

## 2017-05-24 DIAGNOSIS — G8929 Other chronic pain: Secondary | ICD-10-CM | POA: Diagnosis not present

## 2017-05-24 DIAGNOSIS — C9002 Multiple myeloma in relapse: Secondary | ICD-10-CM | POA: Diagnosis not present

## 2017-05-25 DIAGNOSIS — C9002 Multiple myeloma in relapse: Secondary | ICD-10-CM | POA: Diagnosis not present

## 2017-05-25 DIAGNOSIS — I10 Essential (primary) hypertension: Secondary | ICD-10-CM | POA: Diagnosis not present

## 2017-05-25 DIAGNOSIS — G8929 Other chronic pain: Secondary | ICD-10-CM | POA: Diagnosis not present

## 2017-05-25 DIAGNOSIS — J449 Chronic obstructive pulmonary disease, unspecified: Secondary | ICD-10-CM | POA: Diagnosis not present

## 2017-05-25 DIAGNOSIS — Z452 Encounter for adjustment and management of vascular access device: Secondary | ICD-10-CM | POA: Diagnosis not present

## 2017-05-25 DIAGNOSIS — B182 Chronic viral hepatitis C: Secondary | ICD-10-CM | POA: Diagnosis not present

## 2017-05-26 DIAGNOSIS — Z452 Encounter for adjustment and management of vascular access device: Secondary | ICD-10-CM | POA: Diagnosis not present

## 2017-05-26 DIAGNOSIS — J449 Chronic obstructive pulmonary disease, unspecified: Secondary | ICD-10-CM | POA: Diagnosis not present

## 2017-05-26 DIAGNOSIS — I10 Essential (primary) hypertension: Secondary | ICD-10-CM | POA: Diagnosis not present

## 2017-05-26 DIAGNOSIS — B182 Chronic viral hepatitis C: Secondary | ICD-10-CM | POA: Diagnosis not present

## 2017-05-26 DIAGNOSIS — G8929 Other chronic pain: Secondary | ICD-10-CM | POA: Diagnosis not present

## 2017-05-26 DIAGNOSIS — C9002 Multiple myeloma in relapse: Secondary | ICD-10-CM | POA: Diagnosis not present

## 2017-05-28 DIAGNOSIS — G8929 Other chronic pain: Secondary | ICD-10-CM | POA: Diagnosis not present

## 2017-05-28 DIAGNOSIS — I10 Essential (primary) hypertension: Secondary | ICD-10-CM | POA: Diagnosis not present

## 2017-05-28 DIAGNOSIS — C9002 Multiple myeloma in relapse: Secondary | ICD-10-CM | POA: Diagnosis not present

## 2017-05-28 DIAGNOSIS — Z452 Encounter for adjustment and management of vascular access device: Secondary | ICD-10-CM | POA: Diagnosis not present

## 2017-05-28 DIAGNOSIS — J449 Chronic obstructive pulmonary disease, unspecified: Secondary | ICD-10-CM | POA: Diagnosis not present

## 2017-05-28 DIAGNOSIS — B182 Chronic viral hepatitis C: Secondary | ICD-10-CM | POA: Diagnosis not present

## 2017-05-31 ENCOUNTER — Telehealth: Payer: Self-pay

## 2017-05-31 NOTE — Telephone Encounter (Signed)
Kendra Johns @ Hospice called to report that pt passed away on 2017/06/22 @ 10:30pm. They did not see where you had been notified. She also states that they are handling the death certificate. If you have any questions please call her.  Dr. Yong Channel - FYI. Thanks!

## 2017-06-01 NOTE — Telephone Encounter (Signed)
Done

## 2017-06-01 NOTE — Telephone Encounter (Signed)
Sorry to hear this. Thankful for great care by hospice. Thanks Sheena for update. Please make sure to change status to deceased for patient.

## 2017-06-02 DEATH — deceased

## 2017-06-16 ENCOUNTER — Other Ambulatory Visit: Payer: Self-pay | Admitting: Nurse Practitioner
